# Patient Record
Sex: Female | Born: 1950
Health system: Southern US, Community
[De-identification: ages and names within clinical notes are randomized; demographics above are authoritative.]

## PROBLEM LIST (undated history)

## (undated) DIAGNOSIS — I872 Venous insufficiency (chronic) (peripheral): Secondary | ICD-10-CM

## (undated) DIAGNOSIS — R9431 Abnormal electrocardiogram [ECG] [EKG]: Secondary | ICD-10-CM

## (undated) DIAGNOSIS — K635 Polyp of colon: Secondary | ICD-10-CM

## (undated) DIAGNOSIS — E663 Overweight: Secondary | ICD-10-CM

## (undated) DIAGNOSIS — M069 Rheumatoid arthritis, unspecified: Secondary | ICD-10-CM

## (undated) DIAGNOSIS — D72820 Lymphocytosis (symptomatic): Secondary | ICD-10-CM

## (undated) DIAGNOSIS — J189 Pneumonia, unspecified organism: Secondary | ICD-10-CM

## (undated) DIAGNOSIS — K760 Fatty (change of) liver, not elsewhere classified: Secondary | ICD-10-CM

## (undated) DIAGNOSIS — K649 Unspecified hemorrhoids: Secondary | ICD-10-CM

## (undated) DIAGNOSIS — L237 Allergic contact dermatitis due to plants, except food: Secondary | ICD-10-CM

## (undated) DIAGNOSIS — K5730 Diverticulosis of large intestine without perforation or abscess without bleeding: Secondary | ICD-10-CM

## (undated) DIAGNOSIS — T7840XA Allergy, unspecified, initial encounter: Secondary | ICD-10-CM

## (undated) DIAGNOSIS — H269 Unspecified cataract: Secondary | ICD-10-CM

## (undated) DIAGNOSIS — T8859XA Other complications of anesthesia, initial encounter: Secondary | ICD-10-CM

## (undated) DIAGNOSIS — I499 Cardiac arrhythmia, unspecified: Secondary | ICD-10-CM

## (undated) DIAGNOSIS — K219 Gastro-esophageal reflux disease without esophagitis: Secondary | ICD-10-CM

## (undated) DIAGNOSIS — Z9889 Other specified postprocedural states: Secondary | ICD-10-CM

## (undated) DIAGNOSIS — T4145XA Adverse effect of unspecified anesthetic, initial encounter: Secondary | ICD-10-CM

## (undated) DIAGNOSIS — R9389 Abnormal findings on diagnostic imaging of other specified body structures: Secondary | ICD-10-CM

## (undated) DIAGNOSIS — E785 Hyperlipidemia, unspecified: Secondary | ICD-10-CM

## (undated) DIAGNOSIS — C801 Malignant (primary) neoplasm, unspecified: Secondary | ICD-10-CM

## (undated) DIAGNOSIS — I10 Essential (primary) hypertension: Secondary | ICD-10-CM

## (undated) DIAGNOSIS — K5720 Diverticulitis of large intestine with perforation and abscess without bleeding: Secondary | ICD-10-CM

## (undated) DIAGNOSIS — R112 Nausea with vomiting, unspecified: Secondary | ICD-10-CM

## (undated) HISTORY — DX: Abnormal electrocardiogram (ECG) (EKG): R94.31

## (undated) HISTORY — DX: Lymphocytosis (symptomatic): D72.820

## (undated) HISTORY — DX: Gastro-esophageal reflux disease without esophagitis: K21.9

## (undated) HISTORY — DX: Polyp of colon: K63.5

## (undated) HISTORY — DX: Venous insufficiency (chronic) (peripheral): I87.2

## (undated) HISTORY — PX: OTHER SURGICAL HISTORY: SHX169

## (undated) HISTORY — DX: Unspecified cataract: H26.9

## (undated) HISTORY — DX: Hyperlipidemia, unspecified: E78.5

## (undated) HISTORY — PX: JOINT REPLACEMENT: SHX530

## (undated) HISTORY — PX: FRACTURE SURGERY: SHX138

## (undated) HISTORY — DX: Diverticulitis of large intestine with perforation and abscess without bleeding: K57.20

## (undated) HISTORY — PX: NASAL SEPTUM SURGERY: SHX37

## (undated) HISTORY — PX: COLONOSCOPY: SHX174

## (undated) HISTORY — DX: Allergy, unspecified, initial encounter: T78.40XA

## (undated) HISTORY — PX: TONSILLECTOMY: SUR1361

## (undated) HISTORY — DX: Diverticulosis of large intestine without perforation or abscess without bleeding: K57.30

## (undated) HISTORY — DX: Fatty (change of) liver, not elsewhere classified: K76.0

## (undated) HISTORY — DX: Unspecified hemorrhoids: K64.9

## (undated) HISTORY — PX: COSMETIC SURGERY: SHX468

## (undated) HISTORY — PX: EYE SURGERY: SHX253

## (undated) HISTORY — DX: Allergic contact dermatitis due to plants, except food: L23.7

## (undated) HISTORY — DX: Abnormal findings on diagnostic imaging of other specified body structures: R93.89

## (undated) HISTORY — PX: COLON SURGERY: SHX602

## (undated) HISTORY — PX: FOOT SURGERY: SHX648

## (undated) HISTORY — DX: Overweight: E66.3

## (undated) HISTORY — PX: INNER EAR SURGERY: SHX679

## (undated) HISTORY — PX: POLYPECTOMY: SHX149

---

## 1985-11-02 HISTORY — PX: TOTAL ABDOMINAL HYSTERECTOMY: SHX209

## 2000-09-30 ENCOUNTER — Encounter: Payer: Self-pay | Admitting: Obstetrics and Gynecology

## 2000-09-30 ENCOUNTER — Encounter: Admission: RE | Admit: 2000-09-30 | Discharge: 2000-09-30 | Payer: Self-pay | Admitting: Obstetrics and Gynecology

## 2001-07-15 ENCOUNTER — Other Ambulatory Visit: Admission: RE | Admit: 2001-07-15 | Discharge: 2001-07-15 | Payer: Self-pay | Admitting: Internal Medicine

## 2001-07-15 ENCOUNTER — Encounter: Payer: Self-pay | Admitting: Internal Medicine

## 2001-07-15 ENCOUNTER — Encounter (INDEPENDENT_AMBULATORY_CARE_PROVIDER_SITE_OTHER): Payer: Self-pay | Admitting: Specialist

## 2001-10-03 ENCOUNTER — Encounter: Payer: Self-pay | Admitting: Obstetrics and Gynecology

## 2001-10-03 ENCOUNTER — Encounter: Admission: RE | Admit: 2001-10-03 | Discharge: 2001-10-03 | Payer: Self-pay | Admitting: Obstetrics and Gynecology

## 2001-11-16 ENCOUNTER — Encounter: Payer: Self-pay | Admitting: Pulmonary Disease

## 2002-12-08 ENCOUNTER — Encounter: Admission: RE | Admit: 2002-12-08 | Discharge: 2002-12-08 | Payer: Self-pay | Admitting: Obstetrics and Gynecology

## 2002-12-08 ENCOUNTER — Encounter: Payer: Self-pay | Admitting: Obstetrics and Gynecology

## 2004-03-22 ENCOUNTER — Encounter: Admission: RE | Admit: 2004-03-22 | Discharge: 2004-03-22 | Payer: Self-pay | Admitting: Obstetrics and Gynecology

## 2004-09-14 ENCOUNTER — Ambulatory Visit (HOSPITAL_COMMUNITY): Admission: RE | Admit: 2004-09-14 | Discharge: 2004-09-14 | Payer: Self-pay | Admitting: Orthopedic Surgery

## 2004-09-14 ENCOUNTER — Ambulatory Visit (HOSPITAL_BASED_OUTPATIENT_CLINIC_OR_DEPARTMENT_OTHER): Admission: RE | Admit: 2004-09-14 | Discharge: 2004-09-14 | Payer: Self-pay | Admitting: Orthopedic Surgery

## 2004-10-31 ENCOUNTER — Ambulatory Visit: Payer: Self-pay | Admitting: Internal Medicine

## 2004-11-16 ENCOUNTER — Ambulatory Visit: Payer: Self-pay | Admitting: Internal Medicine

## 2004-12-13 ENCOUNTER — Ambulatory Visit: Payer: Self-pay | Admitting: Family Medicine

## 2005-01-16 ENCOUNTER — Ambulatory Visit: Payer: Self-pay | Admitting: Pulmonary Disease

## 2005-02-06 ENCOUNTER — Ambulatory Visit: Payer: Self-pay | Admitting: Pulmonary Disease

## 2005-04-05 ENCOUNTER — Encounter: Admission: RE | Admit: 2005-04-05 | Discharge: 2005-04-05 | Payer: Self-pay | Admitting: Obstetrics and Gynecology

## 2005-05-17 ENCOUNTER — Ambulatory Visit: Payer: Self-pay | Admitting: Pulmonary Disease

## 2005-07-02 ENCOUNTER — Ambulatory Visit: Payer: Self-pay | Admitting: Pulmonary Disease

## 2005-08-01 ENCOUNTER — Ambulatory Visit: Payer: Self-pay | Admitting: Licensed Clinical Social Worker

## 2005-12-21 ENCOUNTER — Ambulatory Visit: Payer: Self-pay | Admitting: Pulmonary Disease

## 2006-05-10 ENCOUNTER — Encounter: Admission: RE | Admit: 2006-05-10 | Discharge: 2006-05-10 | Payer: Self-pay | Admitting: Obstetrics and Gynecology

## 2006-09-30 ENCOUNTER — Ambulatory Visit: Payer: Self-pay | Admitting: Pulmonary Disease

## 2006-10-07 ENCOUNTER — Ambulatory Visit: Payer: Self-pay | Admitting: Pulmonary Disease

## 2006-11-04 ENCOUNTER — Ambulatory Visit: Payer: Self-pay | Admitting: Pulmonary Disease

## 2007-01-28 ENCOUNTER — Ambulatory Visit: Payer: Self-pay | Admitting: Pulmonary Disease

## 2007-01-28 LAB — CONVERTED CEMR LAB
ALT: 18 units/L (ref 0–40)
AST: 20 units/L (ref 0–37)
Basophils Absolute: 0 10*3/uL (ref 0.0–0.1)
Basophils Relative: 0.5 % (ref 0.0–1.0)
Bilirubin, Direct: 0.1 mg/dL (ref 0.0–0.3)
Calcium: 10.3 mg/dL (ref 8.4–10.5)
Creatinine, Ser: 0.8 mg/dL (ref 0.4–1.2)
Direct LDL: 109.2 mg/dL
Eosinophils Absolute: 0.1 10*3/uL (ref 0.0–0.6)
Eosinophils Relative: 1.3 % (ref 0.0–5.0)
MCHC: 35.1 g/dL (ref 30.0–36.0)
Monocytes Absolute: 0.6 10*3/uL (ref 0.2–0.7)
Monocytes Relative: 7.4 % (ref 3.0–11.0)
Neutrophils Relative %: 67.6 % (ref 43.0–77.0)
Sodium: 141 meq/L (ref 135–145)
TSH: 1.83 microintl units/mL (ref 0.35–5.50)
Total Bilirubin: 0.7 mg/dL (ref 0.3–1.2)
Total CHOL/HDL Ratio: 3.4
VLDL: 68 mg/dL — ABNORMAL HIGH (ref 0–40)
WBC: 7.8 10*3/uL (ref 4.5–10.5)

## 2007-05-14 ENCOUNTER — Encounter: Admission: RE | Admit: 2007-05-14 | Discharge: 2007-05-14 | Payer: Self-pay | Admitting: Obstetrics and Gynecology

## 2007-05-28 ENCOUNTER — Ambulatory Visit: Payer: Self-pay | Admitting: Pulmonary Disease

## 2007-05-28 LAB — CONVERTED CEMR LAB
Albumin: 3.2 g/dL — ABNORMAL LOW (ref 3.5–5.2)
Alkaline Phosphatase: 67 units/L (ref 39–117)
Bilirubin, Direct: 0.1 mg/dL (ref 0.0–0.3)
Calcium: 9.6 mg/dL (ref 8.4–10.5)
Chloride: 99 meq/L (ref 96–112)
Creatinine, Ser: 0.9 mg/dL (ref 0.4–1.2)
GFR calc non Af Amer: 68 mL/min
Potassium: 4.2 meq/L (ref 3.5–5.1)
Sodium: 141 meq/L (ref 135–145)
Total Bilirubin: 0.6 mg/dL (ref 0.3–1.2)
Triglycerides: 408 mg/dL (ref 0–149)
VLDL: 82 mg/dL — ABNORMAL HIGH (ref 0–40)

## 2007-10-29 ENCOUNTER — Ambulatory Visit: Payer: Self-pay | Admitting: Pulmonary Disease

## 2008-03-11 ENCOUNTER — Telehealth (INDEPENDENT_AMBULATORY_CARE_PROVIDER_SITE_OTHER): Payer: Self-pay | Admitting: *Deleted

## 2008-03-11 ENCOUNTER — Ambulatory Visit: Payer: Self-pay | Admitting: Pulmonary Disease

## 2008-03-11 DIAGNOSIS — L255 Unspecified contact dermatitis due to plants, except food: Secondary | ICD-10-CM | POA: Insufficient documentation

## 2008-04-10 ENCOUNTER — Encounter: Payer: Self-pay | Admitting: Pulmonary Disease

## 2008-04-14 ENCOUNTER — Telehealth: Payer: Self-pay | Admitting: Pulmonary Disease

## 2008-04-20 ENCOUNTER — Ambulatory Visit: Payer: Self-pay | Admitting: Pulmonary Disease

## 2008-04-20 ENCOUNTER — Encounter: Payer: Self-pay | Admitting: Adult Health

## 2008-04-20 LAB — CONVERTED CEMR LAB
AST: 22 units/L (ref 0–37)
Alkaline Phosphatase: 45 units/L (ref 39–117)
Basophils Absolute: 0.1 10*3/uL (ref 0.0–0.1)
Basophils Relative: 0.9 % (ref 0.0–1.0)
Bilirubin Urine: NEGATIVE
Bilirubin, Direct: 0.2 mg/dL (ref 0.0–0.3)
Calcium: 10.3 mg/dL (ref 8.4–10.5)
HCT: 36.8 % (ref 36.0–46.0)
HDL: 62.7 mg/dL (ref >39.0)
Ketones, ur: NEGATIVE mg/dL
Leukocytes, UA: NEGATIVE
Lymphocytes Relative: 20.1 % (ref 12.0–46.0)
Monocytes Relative: 18.5 % — ABNORMAL HIGH (ref 3.0–12.0)
Neutrophils Relative %: 59.4 % (ref 43.0–77.0)
Nitrite: NEGATIVE
Platelets: 268 10*3/uL (ref 150–400)
Potassium: 4.5 meq/L (ref 3.5–5.1)
RBC: 4.13 M/uL (ref 3.87–5.11)
Sodium: 140 meq/L (ref 135–145)
TSH: 2.16 microintl units/mL (ref 0.35–5.50)
Total CHOL/HDL Ratio: 2.2
Total Protein, Urine: NEGATIVE mg/dL
Vit D, 1,25-Dihydroxy: 40 (ref 30–89)
WBC: 8.7 10*3/uL (ref 4.5–10.5)

## 2008-04-23 ENCOUNTER — Ambulatory Visit: Payer: Self-pay | Admitting: Internal Medicine

## 2008-04-23 DIAGNOSIS — J309 Allergic rhinitis, unspecified: Secondary | ICD-10-CM | POA: Insufficient documentation

## 2008-04-23 DIAGNOSIS — E782 Mixed hyperlipidemia: Secondary | ICD-10-CM | POA: Insufficient documentation

## 2008-04-23 DIAGNOSIS — I872 Venous insufficiency (chronic) (peripheral): Secondary | ICD-10-CM | POA: Insufficient documentation

## 2008-04-23 DIAGNOSIS — K219 Gastro-esophageal reflux disease without esophagitis: Secondary | ICD-10-CM | POA: Insufficient documentation

## 2008-05-14 ENCOUNTER — Encounter: Admission: RE | Admit: 2008-05-14 | Discharge: 2008-05-14 | Payer: Self-pay | Admitting: Obstetrics and Gynecology

## 2008-07-02 ENCOUNTER — Telehealth: Payer: Self-pay | Admitting: Pulmonary Disease

## 2008-07-05 ENCOUNTER — Telehealth: Payer: Self-pay | Admitting: Internal Medicine

## 2008-07-27 ENCOUNTER — Ambulatory Visit: Payer: Self-pay | Admitting: Internal Medicine

## 2008-07-27 LAB — CONVERTED CEMR LAB
Eosinophils Absolute: 0.1 10*3/uL (ref 0.0–0.7)
Eosinophils Relative: 1.7 % (ref 0.0–5.0)
Hemoglobin: 12.4 g/dL (ref 12.0–15.0)
MCHC: 35 g/dL (ref 30.0–36.0)
Monocytes Absolute: 1.1 10*3/uL — ABNORMAL HIGH (ref 0.1–1.0)
Monocytes Relative: 19.8 % — ABNORMAL HIGH (ref 3.0–12.0)
Neutro Abs: 2.6 10*3/uL (ref 1.4–7.7)
Neutrophils Relative %: 47.2 % (ref 43.0–77.0)
RBC: 3.97 M/uL (ref 3.87–5.11)
RDW: 13.5 % (ref 11.5–14.6)
WBC: 5.5 10*3/uL (ref 4.5–10.5)

## 2008-08-16 ENCOUNTER — Ambulatory Visit: Payer: Self-pay | Admitting: Internal Medicine

## 2008-10-11 ENCOUNTER — Telehealth: Payer: Self-pay | Admitting: Pulmonary Disease

## 2008-10-13 ENCOUNTER — Ambulatory Visit: Payer: Self-pay | Admitting: Pulmonary Disease

## 2008-10-20 ENCOUNTER — Ambulatory Visit: Payer: Self-pay | Admitting: Pulmonary Disease

## 2008-10-20 DIAGNOSIS — I10 Essential (primary) hypertension: Secondary | ICD-10-CM | POA: Insufficient documentation

## 2008-10-20 DIAGNOSIS — M542 Cervicalgia: Secondary | ICD-10-CM | POA: Insufficient documentation

## 2008-10-20 DIAGNOSIS — K5730 Diverticulosis of large intestine without perforation or abscess without bleeding: Secondary | ICD-10-CM | POA: Insufficient documentation

## 2008-10-20 DIAGNOSIS — N3090 Cystitis, unspecified without hematuria: Secondary | ICD-10-CM | POA: Insufficient documentation

## 2008-10-20 DIAGNOSIS — K635 Polyp of colon: Secondary | ICD-10-CM | POA: Insufficient documentation

## 2008-10-20 LAB — CONVERTED CEMR LAB
ALT: 31 units/L (ref 0–35)
Alkaline Phosphatase: 43 units/L (ref 39–117)
Basophils Absolute: 0 10*3/uL (ref 0.0–0.1)
Basophils Relative: 0.5 % (ref 0.0–3.0)
Calcium: 9.9 mg/dL (ref 8.4–10.5)
Chloride: 102 meq/L (ref 96–112)
Cholesterol: 146 mg/dL (ref 0–200)
Eosinophils Absolute: 0.1 10*3/uL (ref 0.0–0.7)
Eosinophils Relative: 2 % (ref 0.0–5.0)
GFR calc Af Amer: 65 mL/min
Hemoglobin: 13.1 g/dL (ref 12.0–15.0)
Lymphocytes Relative: 34 % (ref 12.0–46.0)
Lymphocytes Relative: 24.8 % (ref 12.0–46.0)
MCHC: 33.9 g/dL (ref 30.0–36.0)
Monocytes Absolute: 1.6 10*3/uL — ABNORMAL HIGH (ref 0.1–1.0)
Monocytes Relative: 24.4 % — ABNORMAL HIGH (ref 3.0–12.0)
Neutrophils Relative %: 39.1 % — ABNORMAL LOW (ref 43.0–77.0)
Platelets: 244 10*3/uL (ref 150–400)
Potassium: 4.7 meq/L (ref 3.5–5.1)
RBC: 4.23 M/uL (ref 3.87–5.11)
RDW: 12.8 % (ref 11.5–14.6)
Sodium: 138 meq/L (ref 135–145)
TSH: 1.48 microintl units/mL (ref 0.35–5.50)
Total Bilirubin: 0.6 mg/dL (ref 0.3–1.2)
Total Protein: 6.8 g/dL (ref 6.0–8.3)

## 2008-10-31 DIAGNOSIS — E663 Overweight: Secondary | ICD-10-CM | POA: Insufficient documentation

## 2008-10-31 DIAGNOSIS — D72821 Monocytosis (symptomatic): Secondary | ICD-10-CM | POA: Insufficient documentation

## 2008-10-31 LAB — CONVERTED CEMR LAB
Basophils Absolute: 0 10*3/uL (ref 0.0–0.1)
Basophils Relative: 0 % (ref 0–1)
HCT: 39.9 % (ref 36.0–46.0)
Lymphocytes Relative: 26 % (ref 12–46)
MCHC: 33.3 g/dL (ref 30.0–36.0)
MCV: 89.9 fL (ref 78.0–100.0)
Monocytes Absolute: 1.6 10*3/uL — ABNORMAL HIGH (ref 0.1–1.0)
Neutro Abs: 3.9 10*3/uL (ref 1.7–7.7)
Neutrophils Relative %: 52 % (ref 43–77)
Platelets: 279 10*3/uL (ref 150–400)
RBC: 4.44 M/uL (ref 3.87–5.11)
RDW: 14.1 % (ref 11.5–15.5)

## 2008-11-05 ENCOUNTER — Telehealth: Payer: Self-pay | Admitting: Pulmonary Disease

## 2008-12-06 ENCOUNTER — Telehealth: Payer: Self-pay | Admitting: Pulmonary Disease

## 2009-06-16 ENCOUNTER — Encounter: Admission: RE | Admit: 2009-06-16 | Discharge: 2009-06-16 | Payer: Self-pay | Admitting: Obstetrics and Gynecology

## 2009-08-19 ENCOUNTER — Ambulatory Visit: Payer: Self-pay | Admitting: Pulmonary Disease

## 2009-08-22 LAB — CONVERTED CEMR LAB
Basophils Absolute: 0.2 10*3/uL — ABNORMAL HIGH (ref 0.0–0.1)
Eosinophils Absolute: 0.1 10*3/uL (ref 0.0–0.7)
HCT: 37.1 % (ref 36.0–46.0)
Iron: 100 ug/dL (ref 42–145)
Lymphocytes Relative: 29.2 % (ref 12.0–46.0)
MCHC: 35.1 g/dL (ref 30.0–36.0)
MCV: 92.6 fL (ref 78.0–100.0)
Monocytes Relative: 20.9 % — ABNORMAL HIGH (ref 3.0–12.0)
Neutro Abs: 3.1 10*3/uL (ref 1.4–7.7)
Platelets: 215 10*3/uL (ref 150.0–400.0)
RBC: 4.01 M/uL (ref 3.87–5.11)
RDW: 12.9 % (ref 11.5–14.6)
Saturation Ratios: 21.2 % (ref 20.0–50.0)

## 2009-09-12 ENCOUNTER — Encounter: Payer: Self-pay | Admitting: Pulmonary Disease

## 2009-11-07 ENCOUNTER — Encounter: Payer: Self-pay | Admitting: Pulmonary Disease

## 2009-11-15 ENCOUNTER — Telehealth: Payer: Self-pay | Admitting: Pulmonary Disease

## 2009-11-18 ENCOUNTER — Ambulatory Visit: Payer: Self-pay | Admitting: Pulmonary Disease

## 2009-11-22 ENCOUNTER — Ambulatory Visit: Payer: Self-pay | Admitting: Pulmonary Disease

## 2009-11-22 DIAGNOSIS — R9431 Abnormal electrocardiogram [ECG] [EKG]: Secondary | ICD-10-CM | POA: Insufficient documentation

## 2009-11-22 LAB — CONVERTED CEMR LAB
ALT: 38 units/L — ABNORMAL HIGH (ref 0–35)
AST: 36 units/L (ref 0–37)
Albumin: 3.9 g/dL (ref 3.5–5.2)
Basophils Relative: 0 % (ref 0–1)
Bilirubin Urine: NEGATIVE
Eosinophils Relative: 1 % (ref 0–5)
GFR calc non Af Amer: 53.74 mL/min (ref >60)
HCT: 36.4 % (ref 36.0–46.0)
HDL: 53.7 mg/dL (ref >39.00)
Hemoglobin: 12 g/dL (ref 12.0–15.0)
LDL Cholesterol: 91 mg/dL (ref 0–99)
Leukocytes, UA: NEGATIVE
Lymphocytes Relative: 39 % (ref 12–46)
Lymphs Abs: 2.2 10*3/uL (ref 0.7–4.0)
MCHC: 33 g/dL (ref 30.0–36.0)
MCV: 93.3 fL (ref 78.0–100.0)
Monocytes Absolute: 1.4 10*3/uL — ABNORMAL HIGH (ref 0.1–1.0)
Monocytes Relative: 25 % — ABNORMAL HIGH (ref 3–12)
Nitrite: NEGATIVE
Platelets: 267 10*3/uL (ref 150–400)
Potassium: 4.4 meq/L (ref 3.5–5.1)
Total Bilirubin: 0.6 mg/dL (ref 0.3–1.2)
Total Protein: 6.7 g/dL (ref 6.0–8.3)
Triglycerides: 85 mg/dL (ref 0.0–149.0)
VLDL: 17 mg/dL (ref 0.0–40.0)

## 2009-12-05 ENCOUNTER — Ambulatory Visit: Payer: Self-pay | Admitting: Cardiology

## 2009-12-05 ENCOUNTER — Telehealth: Payer: Self-pay | Admitting: Pulmonary Disease

## 2009-12-16 ENCOUNTER — Encounter: Payer: Self-pay | Admitting: Pulmonary Disease

## 2009-12-26 ENCOUNTER — Ambulatory Visit: Payer: Self-pay | Admitting: Internal Medicine

## 2009-12-26 DIAGNOSIS — R1319 Other dysphagia: Secondary | ICD-10-CM | POA: Insufficient documentation

## 2010-03-22 ENCOUNTER — Ambulatory Visit: Payer: Self-pay | Admitting: Internal Medicine

## 2010-03-23 ENCOUNTER — Encounter: Payer: Self-pay | Admitting: Internal Medicine

## 2010-06-28 ENCOUNTER — Encounter: Admission: RE | Admit: 2010-06-28 | Discharge: 2010-06-28 | Payer: Self-pay | Admitting: Obstetrics and Gynecology

## 2010-10-10 ENCOUNTER — Telehealth: Payer: Self-pay | Admitting: Pulmonary Disease

## 2010-10-23 ENCOUNTER — Encounter: Payer: Self-pay | Admitting: Pulmonary Disease

## 2010-11-14 ENCOUNTER — Telehealth (INDEPENDENT_AMBULATORY_CARE_PROVIDER_SITE_OTHER): Payer: Self-pay | Admitting: *Deleted

## 2010-11-16 ENCOUNTER — Ambulatory Visit: Payer: Self-pay | Admitting: Pulmonary Disease

## 2010-11-17 ENCOUNTER — Encounter: Payer: Self-pay | Admitting: Pulmonary Disease

## 2010-11-24 ENCOUNTER — Ambulatory Visit: Payer: Self-pay | Admitting: Pulmonary Disease

## 2010-11-24 DIAGNOSIS — K760 Fatty (change of) liver, not elsewhere classified: Secondary | ICD-10-CM | POA: Insufficient documentation

## 2010-11-24 LAB — CONVERTED CEMR LAB
ALT: 44 units/L — ABNORMAL HIGH (ref 0–35)
Alkaline Phosphatase: 44 units/L (ref 39–117)
Basophils Absolute: 0 10*3/uL (ref 0.0–0.1)
Bilirubin Urine: NEGATIVE
Bilirubin, Direct: 0.1 mg/dL (ref 0.0–0.3)
Calcium: 9.8 mg/dL (ref 8.4–10.5)
Creatinine, Ser: 1 mg/dL (ref 0.4–1.2)
GFR calc non Af Amer: 61.93 mL/min (ref >60.00)
HCT: 35.8 % — ABNORMAL LOW (ref 36.0–46.0)
LDL Cholesterol: 79 mg/dL (ref 0–99)
Lymphs Abs: 1.8 10*3/uL (ref 0.7–4.0)
MCHC: 34.3 g/dL (ref 30.0–36.0)
Monocytes Absolute: 1.8 10*3/uL — ABNORMAL HIGH (ref 0.1–1.0)
Monocytes Relative: 28.9 % — ABNORMAL HIGH (ref 3.0–12.0)
Neutro Abs: 2.6 10*3/uL (ref 1.4–7.7)
Platelets: 180 10*3/uL (ref 150.0–400.0)
Potassium: 4.8 meq/L (ref 3.5–5.1)
RBC: 3.84 M/uL — ABNORMAL LOW (ref 3.87–5.11)
Sodium: 139 meq/L (ref 135–145)
Specific Gravity, Urine: <=1.005 (ref 1.000–1.030)
TSH: 1.78 microintl units/mL (ref 0.35–5.50)
Total CHOL/HDL Ratio: 3
VLDL: 12 mg/dL (ref 0.0–40.0)

## 2011-01-04 NOTE — Letter (Signed)
Summary: Patient Notice- Polyp Results  [facility]  [address] [place], [place] [address]   Phone: [phone]  Fax: [phone]        March 23, 2010 MRN: [id number]    ELIZIBETH BREAU [address] [place], [place]  [address]    Dear Ms. Yetta Barre,  I am pleased to inform you that the colon polyp(s) removed during your recent colonoscopy was (were) found to be benign (no cancer detected) upon pathologic examination.  I recommend you have a repeat colonoscopy examination in 5 years to look for recurrent polyps, as having colon polyps increases your risk for having recurrent polyps or even colon cancer in the future.  Should you develop new or worsening symptoms of abdominal pain, bowel habit changes or bleeding from the rectum or bowels, please schedule an evaluation with either your primary care physician or with me.  Additional information/recommendations:  __ No further action with gastroenterology is needed at this time. Please      follow-up with your primary care physician for your other healthcare      needs.  Please call us if you are having persistent problems or have questions about your condition that have not been fully answered at this time.  Sincerely,  Hilarie Fredrickson MD  This letter has been electronically signed by your physician.  Appended Document: Patient Notice- Polyp Results letter mailed 4.26.11
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
## 2011-01-04 NOTE — Letter (Signed)
Summary: [facility] Instructions  [facility]  [address] [place], [place] [address]   Phone: [phone]  Fax: [phone]       Morgan [name]    Aug 03, 1951    MRN: [id number]        Procedure Day /Date:MONDAY, 01/02/10     Arrival Time:12:30 PM     Procedure Time:1:30 PM     Location of Procedure:                    [facility] (4th Floor)                        PREPARATION FOR COLONOSCOPY WITH MOVIPREP   Starting 5 days prior to your procedure 1/27/11do not eat nuts, seeds, popcorn, corn, beans, peas,  salads, or any raw vegetables.  Do not take any fiber supplements (e.g. Metamucil, Citrucel, and Benefiber).  THE DAY BEFORE YOUR PROCEDURE         DATE:01/01/10  DAY: SUNDAY  1.  Drink clear liquids the entire day-NO SOLID FOOD  2.  Do not drink anything colored red or purple.  Avoid juices with pulp.  No orange juice.  3.  Drink at least 64 oz. (8 glasses) of fluid/clear liquids during the day to prevent dehydration and help the prep work efficiently.  CLEAR LIQUIDS INCLUDE: Water Jello Ice Popsicles Tea (sugar ok, no milk/cream) Powdered fruit flavored drinks Coffee (sugar ok, no milk/cream) Gatorade Juice: apple, white grape, white cranberry  Lemonade Clear bullion, consomm, broth Carbonated beverages (any kind) Strained chicken noodle soup Hard Candy                             4.  In the morning, mix first dose of MoviPrep solution:    Empty 1 Pouch A and 1 Pouch B into the disposable container    Add lukewarm drinking water to the top line of the container. Mix to dissolve    Refrigerate (mixed solution should be used within 24 hrs)  5.  Begin drinking the prep at 5:00 p.m. The MoviPrep container is divided by 4 marks.   Every 15 minutes drink the solution down to the next mark (approximately 8 oz) until the full liter is complete.   6.  Follow completed prep with 16 oz of clear liquid of your choice (Nothing red or  purple).  Continue to drink clear liquids until bedtime.  7.  Before going to bed, mix second dose of MoviPrep solution:    Empty 1 Pouch A and 1 Pouch B into the disposable container    Add lukewarm drinking water to the top line of the container. Mix to dissolve    Refrigerate  THE DAY OF YOUR PROCEDURE      DATE: 01/02/10 DAY: MONDAY  Beginning at 8:30 a.m. (5 hours before procedure):         1. Every 15 minutes, drink the solution down to the next mark (approx 8 oz) until the full liter is complete.  2. Follow completed prep with 16 oz. of clear liquid of your choice.    3. You may drink clear liquids until 11:30 AM(2 HOURS BEFORE PROCEDURE).   MEDICATION INSTRUCTIONS  Unless otherwise instructed, you should take regular prescription medications with a small sip of water   as early as possible the morning of your procedure.  OTHER INSTRUCTIONS  You will need a responsible adult at least 62 years of age to accompany you and drive you home.   This person must remain in the waiting room during your procedure.  Wear loose fitting clothing that is easily removed.  Leave jewelry and other valuables at home.  However, you may wish to bring a book to read or  an iPod/MP3 player to listen to music as you wait for your procedure to start.  Remove all body piercing jewelry and leave at home.  Total time from sign-in until discharge is approximately 2-3 hours.  You should go home directly after your procedure and rest.  You can resume normal activities the  day after your procedure.  The day of your procedure you should not:   Drive   Make legal decisions   Operate machinery   Drink alcohol   Return to work  You will receive specific instructions about eating, activities and medications before you leave.    The above instructions have been reviewed and explained to me by   _______________________    I fully understand and can verbalize these  instructions _____________________________ Date _________

## 2011-01-04 NOTE — Procedures (Signed)
Summary: Colonoscopy/[facility]  Colonoscopy/[facility]   Imported By: Sherian Rein 12/27/2009 07:19:31  _____________________________________________________________________  External Attachment:    Type:   Image     Comment:   External Document

## 2011-01-04 NOTE — Letter (Signed)
Summary: Consuela Mimes MD  Consuela Mimes MD   Imported By: Lester Vernon 11/23/2010 10:59:17  _____________________________________________________________________  External Attachment:    Type:   Image     Comment:   External Document

## 2011-01-04 NOTE — Progress Notes (Signed)
Summary: ct scan results request  Phone Note Call from Patient Call back at [phone]   Caller: Spouse Call For: Morgan [name] Summary of Call: wants to speak with nurse, re: ct scan that was done today. Initial call taken by: Darletta Moll,  December 05, 2009 9:53 AM  Follow-up for Phone Call        FYI-Spouse advised if he can be contacted first if there is bad news to relay to pt. Morgan [name] CMA  December 05, 2009 11:40 AM    SN spoke with pt about her results. Randell Loop CMA  December 08, 2009 3:46 PM

## 2011-01-04 NOTE — Miscellaneous (Signed)
Summary: [facility]  [facility]   Imported By: Lester [place] 12/23/2009 09:41:49  _____________________________________________________________________  External Attachment:    Type:   Image     Comment:   External Document

## 2011-01-04 NOTE — Progress Notes (Signed)
Summary: lab appt  Phone Note Call from Patient Call back at Home Phone [phone]   Caller: Patient Call For: nadel Summary of Call: Pt states she has appt on 12/23 and wants to schedule labs prior to this ov. Initial call taken by: Darletta Moll,  November 14, 2010 9:39 AM  Follow-up for Phone Call        Called, spoke with pt.  She has appt scheduled with SN for 11/24/10.  Would like to come in sometime this wk for labs.  Pls advise of labs pt will need.  Thanks! Gweneth Dimitri RN  November 14, 2010 10:30 AM   Additional Follow-up for Phone Call Additional follow up Details #1::        per SN---ok for pt to come in for labs----lip,bmp.hepat,cbcd,tsh,vit d and ua---v70.0.  thanks Randell Loop [facility]  November 14, 2010 12:17 PM     Additional Follow-up for Phone Call Additional follow up Details #2::    Pt advised that lab orders are in computer. Abigail Miyamoto RN  November 14, 2010 12:22 PM

## 2011-01-04 NOTE — Progress Notes (Signed)
Summary: refill   Phone Note Refill Request Message from:  Pharmacy on [facility] [phone]  fax#[phone]  Refills Requested: Medication #1:  SIMVASTATIN 40 MG TABS take 1 tab by mouth at bedtime...  Medication #2:  MICARDIS HCT 40-12.5 MG TABS take 1 tabpo once daily...  Medication #3:  FENOFIBRATE 160 MG TABS take 1 tab by mouth once daily... Initial call taken by: Alida Rogers,CMA November 8,2011 Caller: [facility]    Prescriptions: FENOFIBRATE 160 MG TABS (FENOFIBRATE) take 1 tab by mouth once daily...  #90 x 3   Entered by:   Randell Loop CMA   Authorized by:   Michele Mcalpine MD   Signed by:   Randell Loop CMA on 10/10/2010   Method used:   Faxed to ...       [facility]       [address]       [place], [place]  [address]       Ph: [phone]       Fax: [phone]   RxID:   [id number] SIMVASTATIN 40 MG TABS (SIMVASTATIN) take 1 tab by mouth at bedtime...  #90 x 3   Entered by:   Randell Loop CMA   Authorized by:   Michele Mcalpine MD   Signed by:   Randell Loop CMA on 10/10/2010   Method used:   Faxed to ...       [facility]       [address]       [place], [place]  [address]       Ph: [phone]       Fax: [phone]   RxID:   [id number] MICARDIS HCT 40-12.5 MG TABS (TELMISARTAN-HCTZ) take 1 tabpo once daily...  #90 x 3   Entered by:   Randell Loop CMA   Authorized by:   Michele Mcalpine MD   Signed by:   Randell Loop CMA on 10/10/2010   Method used:   Faxed to ...       [facility]       [address]       [place], [place]  [address]       Ph: [phone]       Fax: [phone]   RxID:   [id number]  pt has appt in dec with Dr. Elisha Ponder CMA  October 10, 2010 4:36 PM

## 2011-01-04 NOTE — Procedures (Signed)
Summary: Colonoscopy  Patient: Morgan [name] Note: All result statuses are Final unless otherwise noted.  Tests: (1) Colonoscopy (COL)   COL Colonoscopy           DONE     [facility]     [address]     [place], [place]  [address]           COLONOSCOPY PROCEDURE REPORT           PATIENT:  [name], [name]  MR#:  [id number]     BIRTHDATE:  [birth year], 58 yrs. old  GENDER:  female     ENDOSCOPIST:  Wilhemina Bonito. Eda Keys, MD     REF. BY:  Office     PROCEDURE DATE:  03/22/2010     PROCEDURE:  Colonoscopy with snare polypectomy x 1     ASA CLASS:  Class II     INDICATIONS:  history of pre-cancerous (adenomatous) colon polyps     (index 2002, f/u 2005),     surveillance and high-risk screening     MEDICATIONS:   Fentanyl 100 mcg IV, Versed 12 mg IV, Benadryl 50     mg IV           DESCRIPTION OF PROCEDURE:   After the risks benefits and     alternatives of the procedure were thoroughly explained, informed     consent was obtained.  Digital rectal exam was performed and     revealed no abnormalities.   The LB [id number] endoscope     was introduced through the anus and advanced to the cecum, which     was identified by both the appendix and ileocecal valve, without     limitations.Time to cecum = 3:32 min.  The quality of the prep was     excellent, using MoviPrep.  The instrument was then slowly     withdrawn (t= 13:47 min) as the colon was fully examined.     <<PROCEDUREIMAGES>>           FINDINGS:  A diminutive polyp was found in the descending colon.     Polyp was snared without cautery. Retrieval was successful. snare     polyp  Mild diverticulosis was found in the sigmoid colon.     Retroflexed views in the rectum revealed no abnormalities.    The     scope was then withdrawn from the patient and the procedure     completed.           COMPLICATIONS:  None     ENDOSCOPIC IMPRESSION:     1) Diminutive polyp in the descending colon - removed     2) Mild  diverticulosis in the sigmoid colon     RECOMMENDATIONS:     1) Follow up colonoscopy in 5 years           ______________________________     Wilhemina Bonito. Eda Keys, MD           CC:  Michele Mcalpine, MD; The Patient           n.     eSIGNED:   Wilhemina Bonito. Eda Keys at 03/22/2010 10:44 AM           Larrie Kass, [phone]  Note: An exclamation mark (!) indicates a result that was not dispersed into the flowsheet. Document Creation Date: 03/22/2010 10:45 AM _______________________________________________________________________  (1) Order result status: Final Collection or observation date-time: 03/22/2010 10:33 Requested date-time:  Receipt date-time:  Reported date-time:  Referring Physician:   Ordering Physician: Fransico Setters [phone]) Specimen Source:  Source: Launa Grill Order Number: [id number] Lab site:   Appended Document: Colonoscopy recall in 5 yrs/03-2015     Procedures Next Due Date:    Colonoscopy: 04/2015

## 2011-01-04 NOTE — Assessment & Plan Note (Signed)
Summary: rov//mbw   Primary Care Montell Leopard:  Alroy Dust, MD  CC:  Yearly ROV & review of mult medical problems....  History of Present Illness: 60 y/o WF here for a follow up visit & CPX... she has multiple medical problems as noted below...     ~  Nov09:  she had a bout of diverticulitis in Aug09 treated by DrPerry and finally resolved w/ Augmentin Rx... other than that- she has had a good year & her weight is down 12# on her diet to 199#.Marland Kitchen.   ~  November 22, 2009:  she reports another good year w/o new problems or concerns... her BP is controlled on the MicardisHct & she denies CP, palpit, etc...  she has had laser vein surg by DrKrusch on her right leg & will be getting the same on her left leg soon...  she does note occas dysphagia/ choking & we discussed GI eval from DrPerry (she has 12yr colon due too)...   ~  November 24, 2010:  she had GI eval from DrPerry 4/11 w/ EGD (normal), and Colon (mild sigm divertics & diminutive polyp= polypoid mucosa)... she continues to f/u w/ Vein specialists & DrFeatherston has done more surg & she is delighted w/ the results... her weight is down 7# to 182# today & she has been walking 2mi 5d/wk... only new complaint> right carpel tunnel symptoms & she states it's not bad enough for NCV testing, therefore rec wrist splint trial... recent labs reviewed & Lipids look great on Rx;  sl incr LFTs from fatty liver dis & she is reminded to avoid Etoh...    Current Problem List:  HEARING LOSS (ICD-389.9) - hx mild hi freq hearing loss- eval by DrPahel in 2004...  ALLERGIC RHINITIS (ICD-477.9) - on FEXOFENADINE 180mg /d... mild allergy symptoms...  ? of CHEST XRAY, ABNORMAL (ICD-793.1) -   ~  CXR 11/09 showed clear lungs, mild biapical pleural thickening, calcif Ao  ~  routine CXR 12/10 showed ?sm nodular opac RUL, & CTChest did not reveal a nodule- just biapical pleuroparenchymal scarring, & fatty liver...  ~  CXR 12/11 showed   HYPERTENSION (ICD-401.9) - on  MICARDIS/ HCT 40-12.5 daily... BP= 122/68 today and doing well- taking med regularly & tolerated well... denies HA, fatigue, visual changes, CP, palipit, dizziness, syncope, dyspnea, edema, etc...   ~  2DEcho 10/01 was WNL.Marland Kitchen. norm wall thickness, norm valves, norm EF...  ~  NuclearStressTest 10/01 showed breast attenuation, normal wall motion, EF= 65%...  ABNORMAL ELECTROCARDIOGRAM (ICD-794.31) - hx intermittent RBBB/ IVCD in the past and few PVC's noted (Holter 1997)... she is asymptomatic w/o CP, palpit. dizzy. syncope, etc...  ~  EKG 12/10 w/ RBBB, otherw neg...  VENOUS INSUFFICIENCY (ICD-459.81) - on low sodium diet, elevation, compression hose, etc... she has been followed since 2007 by drKrusch et al at the Vein clinic w/ surgery including endovenous chemical ablation & sclerotherapy in 2008, 2009, 2010, & 2011> she is very happy w/ her results & note from [facility] 11/11 is reviewed.  HYPERLIPIDEMIA (ICD-272.4) - on SIMVASTATIN 40mg /d & FENOFIBRATE 160mg /d...  ~  FLP 6/08 showed TChol 213, TG 408, HDL 56, LDL 101... on Vytorin10/20, added Tricor145.  ~  FLP 11/09 on Vytor10/20+Tric145 showed TChol 146, TG 75, HDL 52, LDL 79... changed to Simva40 + [id number] for $$ reasons...  ~  FLP 12/10 on Simva40+Feno160 showed TChol 162, TG 85, HDL 54, LDL 91  ~  FLP 12/11 on Simva40+Feno160 showed TChol 149, TG 60, HDL 59, LDL 79  OVERWEIGHT (ICD-278.02) - we discussed diet + exercise therapy...  ~  peak weight = 220# in 2007  ~  weight 11/09 = 199#  ~  weight 12/10 = 189#  ~  weight 12/11 = 182#  GERD (ICD-530.81) - she uses OTC PRILOSEC 20mg  as needed...   ~  EGD in 1976 by DrSam showed mild gastritis...  ~  12/10: she notes intermittent choking, dysphagia> EGD 4/11 by DrPerry was normal/ neg.  DIVERTICULOSIS OF COLON (ICD-562.10), & COLONIC POLYPS (ICD-211.3)  ~  colonoscopy 12/05 by DrPerry showed divertics and 2-45mm polyps= adenomatous and hyperplastic...  ~  colonoscopy 4/11 by  DrPerry showed mild sigm divertics, diminutive polyp= polypoid mucosa only...  FATTY LIVER DISEASE (ICD-571.8) - she has mild elev LFTs & instructed on low fat diet, wt reduction strategy.  ~  LFTs 11/09 (wt=191#) showed AlkPhos=43, SGOT=28, SGPT=31  ~  LFTs 12/10 (wt=189#) showed AlkPhos=47, SGOT=36, SGPT=38  ~  CT Chest 1/11 showed fatty liver as an incidental finding.  ~  LFTs 12/11 (wt=182#) showed AlkPhos=44, SGOT=52, SGPT=44  Hx of CYSTITIS (ICD-595.9) - Urology eval 11/08 by [id number] for chr cystitis...  Hx of NECK PAIN (ICD-723.1) - eval 11/09 w/ XRays showing some spondylosis & Rx w/ Tramadol, Rest, Heat, Tylenol...  CONTACT DERMATITIS DUE TO POISON IVY (ICD-692.6)  LYMPHOCYTOSIS (ICD-288.8) - CBC's show a mononuclear lymphocytosis of ? signif...  ~  CBC 2001 w/ WBC= 7.4 & 15% lymphs, 8% monos...  ~  CBC 10/07 showed WBC= 7.6 w/ 23% lymphs, 7% monos...  ~  CBC 2/08 showed WBC= 7.8 w/ 23% lymphs, 7% monos...  ~  CBC 5/09 showed WBC= 8.7 w/ 20% lymphs, 19% monos...  ~  CBC 11/09 showed WBC= 5.5 w/ 34% lymphs, 24% monos... repeat= 21% monos and pathologist review (DrHessling)= "normal RBC, WBC, plat morphology... normal peripheral blood smear"...  ~  CBC 12/10 showed WBC= 5.5, Lymphs= 35%, Monos= 25%  ~  CBC 12/11 showed WBC= 6.3, Lymph=29%, Mono=29%  Health Maintenance:   she is under some stress related to her husb smoking.  ~  GI:  DrPerry & up to date on colon screening...  ~  GYN= DrMcPhail on Estrdiol 0.5, +calcium, MVI... she gets PAP, Mammograms, etc...  ~  Immunizations:  she gets the yearly seasonal Flu vaccine... Pneumovax due age 72... ?last Tetanus shot.   Preventive Screening-Counseling & Management  Alcohol-Tobacco     Smoking Status: quit     Year Quit: 2003     Pack years: 15  Allergies (verified): No Known Drug Allergies  Comments:  Nurse/Medical Assistant: The patient's medications were reviewed with the patient's caretaker and were updated in the  Medication and Allergy Lists.  Past History:  Past Medical History: HEARING LOSS (ICD-389.9) ALLERGIC RHINITIS (ICD-477.9) ? of CHEST XRAY, ABNORMAL (ICD-793.1) HYPERTENSION (ICD-401.9) ABNORMAL ELECTROCARDIOGRAM (ICD-794.31) VENOUS INSUFFICIENCY (ICD-459.81) HYPERLIPIDEMIA (ICD-272.4) OVERWEIGHT (ICD-278.02) GERD (ICD-530.81) DIVERTICULOSIS OF COLON (ICD-562.10) COLONIC POLYPS (ICD-211.3) FATTY LIVER DISEASE (ICD-571.8) Hx of CYSTITIS (ICD-595.9) NECK PAIN (ICD-723.1) CONTACT DERMATITIS DUE TO POISON IVY (ICD-692.6) LYMPHOCYTOSIS (ICD-288.8)  Past Surgical History: S/P Hysterectomy 12/86 S/P Ear surgery (childhood) Hx Deviated Septum S/P foot surgery S/P chemical & laser endovenous ablation of LE veins    in 2008, 2009, 2010, 2011 by DrKrusch et al.  Family History: Reviewed history from 07/27/2008 and no changes required. Family history of heart disease in sister cancer in half-sister Mother is living, has hyperlipidema and hypertension Father died in 89, but pt was not close to him Sister  1 living, has heart disease, hx of liver transplant 49yrs ago Sister 2 living with no significant medical history Patient has a half-sister who passed at age 78 due to breast cancer No FH of Colon Cancer:  Social History: Reviewed history from 04/23/2008 and no changes required. Patient works as a Geographical information systems officer Quit smoking in Jan 2003 Occ drinks alcohol Patient is married with 4 children  Review of Systems       The patient complains of nocturia, arthritis, and anxiety.  The patient denies fever, chills, sweats, anorexia, fatigue, weakness, malaise, weight loss, sleep disorder, blurring, diplopia, eye irritation, eye discharge, vision loss, eye pain, photophobia, earache, ear discharge, tinnitus, decreased hearing, nasal congestion, nosebleeds, sore throat, hoarseness, chest pain, palpitations, syncope, dyspnea on exertion, orthopnea, PND, peripheral edema,  cough, dyspnea at rest, excessive sputum, hemoptysis, wheezing, pleurisy, nausea, vomiting, diarrhea, constipation, change in bowel habits, abdominal pain, melena, hematochezia, jaundice, gas/bloating, indigestion/heartburn, dysphagia, odynophagia, dysuria, hematuria, urinary frequency, urinary hesitancy, incontinence, back pain, joint pain, joint swelling, muscle cramps, muscle weakness, stiffness, sciatica, restless legs, leg pain at night, leg pain with exertion, rash, itching, dryness, suspicious lesions, paralysis, paresthesias, seizures, tremors, vertigo, transient blindness, frequent falls, frequent headaches, difficulty walking, depression, memory loss, confusion, cold intolerance, heat intolerance, polydipsia, polyphagia, polyuria, unusual weight change, abnormal bruising, bleeding, enlarged lymph nodes, urticaria, allergic rash, hay fever, and recurrent infections.    Vital Signs:  Patient profile:   60 year old female Height:      64 inches Weight:      181.50 pounds BMI:     31.27 O2 Sat:      100 % on Room air Temp:     97.6 degrees F oral Pulse rate:   78 / minute BP sitting:   122 / 68  (left arm) Cuff size:   regular  Vitals Entered By: Randell Loop CMA (November 24, 2010 12:04 PM)  O2 Sat at Rest %:  100 O2 Flow:  Room air CC: Yearly ROV & review of mult medical problems... Is Patient Diabetic? No Pain Assessment Patient in pain? no      Comments no changes in meds today   Physical Exam  Additional Exam:  WD, Overweight, 60 y/o WF in NAD... GENERAL:  Alert & oriented; pleasant & cooperative... HEENT:  Jet/AT, EOM-wnl, PERRLA, EACs-clear, TMs-wnl, NOSE-clear, THROAT-clear & wnl. NECK:  Supple w/ fairROM; no JVD; normal carotid impulses w/o bruits; no thyromegaly or nodules palpated; no lymphadenopathy. CHEST:  Clear to P & A; without wheezes/ rales/ or rhonchi. HEART:  Regular Rhythm; without murmurs/ rubs/ or gallops. ABDOMEN:  Soft & nontender, +panniculus; normal  bowel sounds; no organomegaly or masses detected. EXT: without deformities, mild arthritic changes; varicose veins/ +venous insuffic/ tr edema. NEURO:  CN's intact; motor testing normal; sensory testing normal; gait normal & balance OK. DERM:  No lesions noted; no rash etc...    CXR  Procedure date:  11/24/2010  Findings:      CHEST - 2 VIEW Comparison: Chest x-ray of 11/22/2009   Findings: The small nodule noted previously within the right mid upper lung field is stable and most consistent with a granuloma. No active infiltrate or effusion is seen.  Mediastinal contours are stable.  The heart is within normal limits in size.  No bony abnormality is seen.   IMPRESSION: Stable chest x-ray.  No active lung disease.   Read By:  Juline Patch,  M.D.   MISC. Report  Procedure date:  11/17/2010  Findings:       ~  FASTING Labs 11/17/10 reviewed w/ pt & attached to chart...   Impression & Recommendations:  Problem # 1:  ? of CHEST XRAY, ABNORMAL (ICD-793.1) Follow up CXR remains neg>  NAD...   Problem # 2:  HYPERTENSION (ICD-401.9) BP controlled>  continue same med. Her updated medication list for this problem includes:    Micardis Hct 40-12.5 Mg Tabs (Telmisartan-hctz) .Marland Kitchen... Take 1 tabpo once daily...  Problem # 3:  ABNORMAL ELECTROCARDIOGRAM (ICD-794.31) She has chr RBBB>  asymptomatic w/o CP, palpit, dizzy etc...  Problem # 4:  VENOUS INSUFFICIENCY (ICD-459.81) Followed by DrKrusch at the vein clinic...  Problem # 5:  HYPERLIPIDEMIA (ICD-272.4) Doing very well on the Simva+ Feno... Her updated medication list for this problem includes:    Simvastatin 40 Mg Tabs (Simvastatin) .Marland Kitchen... Take 1 tab by mouth at bedtime...    Fenofibrate 160 Mg Tabs (Fenofibrate) .Marland Kitchen... Take 1 tab by mouth once daily...  Problem # 6:  OVERWEIGHT (ICD-278.02) We reviewed diet + exercise recs...  Problem # 7:  COLONIC POLYPS (ICD-211.3) GI is stable and up to date...  Problem # 8:   FATTY LIVER DISEASE (ICD-571.8) Sl elev of SGOT & SGPT noted... discussed diet + exercise, get wt down, avoid hepatotoxins & alcohol...  Problem # 9:  LYMPHOCYTOSIS (ICD-288.8) Persist & ?sl progressive monocytosis... path review of smear in 2009 was neg... she is asymptomatic... may need referral to Heme/ DrGranfortuna soon...  Complete Medication List: 1)  Allegra 180 Mg Tabs (Fexofenadine hcl) .... Take 1 tab by mouth once daily.Marland KitchenMarland Kitchen 2)  Adult Aspirin Low Strength 81 Mg Tbdp (Aspirin) .... Take every other day 3)  Micardis Hct 40-12.5 Mg Tabs (Telmisartan-hctz) .... Take 1 tabpo once daily.Marland KitchenMarland Kitchen 4)  Simvastatin 40 Mg Tabs (Simvastatin) .... Take 1 tab by mouth at bedtime.Marland KitchenMarland Kitchen 5)  Fenofibrate 160 Mg Tabs (Fenofibrate) .... Take 1 tab by mouth once daily.Marland KitchenMarland Kitchen 6)  Estradiol 0.5 Mg Tabs (Estradiol) .... Take 1 tablet by mouth two times a day 7)  Calcium 600 600 Mg Tabs (Calcium carbonate) .... Two times a day 8)  Womens Multivitamin Plus Tabs (Multiple vitamins-minerals) .... Take 1 tablet by mouth once a day  Other Orders: T-2 View CXR ([id number])  Patient Instructions: 1)  Today we updated your med list- see below.... 2)  We refilled your meds for 90d supplies.Marland KitchenMarland Kitchen 3)  Today we reviewed your recent fasting blood work, and we did a follow up CXR - please call the "phone tree" in a few days for your  results.Marland KitchenMarland Kitchen  4)  Keep up the good work w/ diet + exercise... 5)  We gave you the seasonal Flu vaccine today... 6)  Call for any problems.Marland KitchenMarland Kitchen 7)  Please schedule a follow-up appointment in 1 year, sooner as needed. Prescriptions: FENOFIBRATE 160 MG TABS (FENOFIBRATE) take 1 tab by mouth once daily...  #90 x 4   Entered and Authorized by:   Michele Mcalpine MD   Signed by:   Michele Mcalpine MD on 11/24/2010   Method used:   Print then Give to Patient   RxID:   [id number] SIMVASTATIN 40 MG TABS (SIMVASTATIN) take 1 tab by mouth at bedtime...  #90 x 4   Entered and Authorized by:   Michele Mcalpine MD    Signed by:   Michele Mcalpine MD on 11/24/2010   Method used:   Print then Give to Patient   RxID:   [id number] MICARDIS  HCT 40-12.5 MG TABS (TELMISARTAN-HCTZ) take 1 tabpo once daily...  #90 x 4   Entered and Authorized by:   Michele Mcalpine MD   Signed by:   Michele Mcalpine MD on 11/24/2010   Method used:   Print then Give to Patient   RxID:   [id number]    Immunization History:  Influenza Immunization History:    Influenza:  historical (12/12/2009)

## 2011-01-04 NOTE — Procedures (Signed)
Summary: Upper Endoscopy  Patient: Morgan [name] Note: All result statuses are Final unless otherwise noted.  Tests: (1) Upper Endoscopy (EGD)   EGD Upper Endoscopy       DONE     [facility]     [address]     [place], [place]  [address]           ENDOSCOPY PROCEDURE REPORT           PATIENT:  Morgan, [name]  MR#:  [id number]     BIRTHDATE:  1951/04/27, 58 yrs. old  GENDER:  female           ENDOSCOPIST:  Wilhemina Bonito. Eda Keys, MD     Referred by:  Office           PROCEDURE DATE:  03/22/2010     PROCEDURE:  EGD, diagnostic     ASA CLASS:  Class II     INDICATIONS:  dysphagia, GERD  (reports dysphagia has been better     since OV)           MEDICATIONS:   There was residual sedation effect present from     prior procedure., Versed 3 mg IV     TOPICAL ANESTHETIC:  Exactacain Spray           DESCRIPTION OF PROCEDURE:   After the risks benefits and     alternatives of the procedure were thoroughly explained, informed     consent was obtained.  The [facility] [id number] endoscope was     introduced through the mouth and advanced to the second portion of     the duodenum, without limitations.  The instrument was slowly     withdrawn as the mucosa was fully examined.     <<PROCEDUREIMAGES>>           The upper, middle, and distal third of the esophagus were     carefully inspected and no abnormalities were noted. The z-line     was well seen at the GEJ. The endoscope was pushed into the fundus     which was normal including a retroflexed view. The antrum,gastric     body, first and second part of the duodenum were unremarkable.     Retroflexed views revealed no abnormalities.    The scope was then     withdrawn from the patient and the procedure completed.           COMPLICATIONS:  None           ENDOSCOPIC IMPRESSION:     1) Normal EGD     2) Gerd           RECOMMENDATIONS:     1) FOLLOW UP PRN           ______________________________     Wilhemina Bonito. Eda Keys, MD           CC:  Michele Mcalpine, MD, The Patient           n.     eSIGNEDWilhemina Bonito. Eda Keys at 03/22/2010 10:52 AM           Larrie Kass, [id number]  Note: An exclamation mark (!) indicates a result that was not dispersed into the flowsheet. Document Creation Date: 03/22/2010 10:53 AM _______________________________________________________________________  (1) Order result status: Final Collection or observation date-time: 03/22/2010 10:47 Requested date-time:  Receipt date-time:  Reported date-time:  Referring Physician:   Ordering Physician: Fransico Setters [phone])  Specimen Source:  Source: Launa Grill Order Number: [id number] Lab site:

## 2011-04-20 NOTE — Op Note (Signed)
NAME:  Morgan [name], Morgan [name]               ACCOUNT NO.:  [id number]   MEDICAL RECORD NO.:  [id number]          PATIENT TYPE:  AMB   LOCATION:  DSC                          FACILITY:  [facility]   PHYSICIAN:  Nadara Mustard, M.D.   DATE OF BIRTH:  [birth year]   DATE OF PROCEDURE:  09/14/2004  DATE OF DISCHARGE:                                 OPERATIVE REPORT   PREOPERATIVE DIAGNOSIS:  Metatarsalgia left foot second and third  metatarsals with a dislocation of the MTP joint of the second toe.   POSTOPERATIVE DIAGNOSIS:  Metatarsalgia left foot second and third  metatarsals with a dislocation of the MTP joint of the second toe.   PROCEDURE:  Left second and third toe Weil osteotomies.   SURGEON:  Nadara Mustard, M.D.   ANESTHESIA:  Popliteal block plus general.   ESTIMATED BLOOD LOSS:  Minimal.   ANTIBIOTICS:  1 gram Kefzol.   TOURNIQUET TIME:  Esmarch at the ankle for approximately 15 minutes.   DISPOSITION:  To the PACU in stable condition.   INDICATIONS FOR PROCEDURE:  The patient is a 60 year old woman with a  dislocation of the MTP joint of the second toe left foot as well as clawing  of the second and third toes with painful metatarsalgia.  The patient has  failed conservative care and presents at this time for surgical  intervention.  The risks and benefits were discussed including infection,  neurovascular injury, persistent pain, need for additional surgery.  The  patient states she understands and wishes to proceed at this time.   DESCRIPTION OF PROCEDURE:  The patient was brought to OR room 8 and  underwent a general anesthetic after a popliteal block.  After an adequate  level of anesthesia was obtained, the patient was placed in the supine  position and the left lower extremity was prepped using DuraPrep and draped  in a sterile field.  An incision was made between the second and third toes  over the second web space.  This was then bluntly carried down to the MTP  joint of  the second toe.  Small Holman retractors were placed.  A Weil  osteotomy was performed of the second metatarsal.  This allowed the  metatarsal head to translate proximally and a 12 mm spin screw was used to  stabilize the osteotomy.  A lengthening of the extensor tendon was performed  to align the MTP joint.  This reduced the MTP joint.  Attention was then  focused on the third metatarsal.  Again, blunt dissection was carried down  to the MTP joint.  Holman retractors were placed.  The oblique osteotomy was  performed of the third metatarsal Weil osteotomy and a 12 mm spin screw was  placed to stabilize the metatarsal  head after it was translated proximally.  Again, a lengthening of the extensor tendon was performed to stabilize the  MTP joint.  The alignment of the toes was appropriate postoperatively.  She  had no prominence of the metatarsal heads.  The wound was irrigated with  normal saline.  The Esmarch, which was wrapped  around the ankle for  tourniquet control, was released after 15 minutes and  hemostasis was obtained.  The incision was closed using a vertical mattress  with 2-0 nylon.  The wound was covered with Adaptic, orthopedic sponges,  sterile Webril, and a Coban dressing.  The patient was extubated and taken  to the PACU in stable condition.  Plan for discharge to home.  Follow up in  the office in two weeks.       MVD/MEDQ  D:  09/14/2004  T:  09/14/2004  Job:  [id number]

## 2011-11-14 ENCOUNTER — Ambulatory Visit (INDEPENDENT_AMBULATORY_CARE_PROVIDER_SITE_OTHER): Payer: Managed Care, Other (non HMO) | Admitting: Adult Health

## 2011-11-14 ENCOUNTER — Encounter: Payer: Self-pay | Admitting: *Deleted

## 2011-11-14 DIAGNOSIS — J111 Influenza due to unidentified influenza virus with other respiratory manifestations: Secondary | ICD-10-CM

## 2011-11-14 MED ORDER — OSELTAMIVIR PHOSPHATE 75 MG PO CAPS: 75.0000 mg | ORAL_CAPSULE | Freq: Two times a day (BID) | ORAL | Status: AC

## 2011-11-14 NOTE — Patient Instructions (Signed)
Tamiflu 75mg  Twice daily  For 5 days  Alternate Tylenol and Advil As needed  Fever/aches Push fluids  Mucinex DM Twice daily  As needed  Cough/congestion  Please contact office for sooner follow up if symptoms do not improve or worsen or seek emergency care  follow up Dr. Kriste Basque  In 4 weeks for physical.

## 2011-11-14 NOTE — Assessment & Plan Note (Signed)
Acute Influenza w/ URI   Plan;  Tamiflu 75mg  Twice daily  For 5 days  Alternate Tylenol and Advil As needed  Fever/aches Push fluids  Mucinex DM Twice daily  As needed  Cough/congestion  Please contact office for sooner follow up if symptoms do not improve or worsen or seek emergency care  follow up Dr. Kriste Basque  In 4 weeks for physical.

## 2011-11-14 NOTE — Progress Notes (Signed)
Subjective:    Patient ID: Morgan [name], female    DOB: May 31, 1951, 60 y.o.   MRN: [id number]  HPI 60  y/o WF    ~ Nov09: she had a bout of diverticulitis in Aug09 treated by DrPerry and finally resolved w/ Augmentin Rx... other than that- she has had a good year & her weight is down 12# on her diet to 199#.Marland Kitchen.   ~ November 22, 2009: she reports another good year w/o new problems or concerns... her BP is controlled on the MicardisHct & she denies CP, palpit, etc... she has had laser vein surg by DrKrusch on her right leg & will be getting the same on her left leg soon... she does note occas dysphagia/ choking & we discussed GI eval from DrPerry (she has 53yr colon due too)...   ~ November 24, 2010: she had GI eval from DrPerry 4/11 w/ EGD (normal), and Colon (mild sigm divertics & diminutive polyp= polypoid mucosa)... she continues to f/u w/ Vein specialists & DrFeatherston has done more surg & she is delighted w/ the results... her weight is down 7# to 182# today & she has been walking 2mi 5d/wk... only new complaint> right carpel tunnel symptoms & she states it's not bad enough for NCV testing, therefore rec wrist splint trial... recent labs reviewed & Lipids look great on Rx; sl incr LFTs from fatty liver dis & she is reminded to avoid Etoh...   11/14/2011 Acute OV  Complains of scratchy throat x2days, sore throat, HA, chills, temp up to 101.4 x1day. Complains of sudden onset of body aches, chills, fever 101.4. Sister dx with flu yesterday. No chest pain or dyspnea.  Appetite down w/ no n/v/d. No recent travel or abx use.  No dyspnea   PMH:   HEARING LOSS (ICD-389.9) - hx mild hi freq hearing loss- eval by DrPahel in 2004...   ALLERGIC RHINITIS (ICD-477.9) - on FEXOFENADINE 180mg /d... mild allergy symptoms...  ? of CHEST XRAY, ABNORMAL (ICD-793.1) -  ~ CXR 11/09 showed clear lungs, mild biapical pleural thickening, calcif Ao  ~ routine CXR 12/10 showed ?sm nodular opac RUL, & CTChest did  not reveal a nodule- just biapical pleuroparenchymal scarring, & fatty liver...  ~ CXR 12/11 showed   HYPERTENSION (ICD-401.9) - on MICARDIS/ HCT 40-12.5 daily... BP= 122/68 today and doing well- taking med regularly & tolerated well... denies HA, fatigue, visual changes, CP, palipit, dizziness, syncope, dyspnea, edema, etc...  ~ 2DEcho 10/01 was WNL.Marland Kitchen. norm wall thickness, norm valves, norm EF...  ~ NuclearStressTest 10/01 showed breast attenuation, normal wall motion, EF= 65%...   ABNORMAL ELECTROCARDIOGRAM (ICD-794.31) - hx intermittent RBBB/ IVCD in the past and few PVC's noted (Holter 1997)... she is asymptomatic w/o CP, palpit. dizzy. syncope, etc...  ~ EKG 12/10 w/ RBBB, otherw neg...   VENOUS INSUFFICIENCY (ICD-459.81) - on low sodium diet, elevation, compression hose, etc... she has been followed since 2007 by drKrusch et al at the Ve[facility] clinic w/ surgery including endovenous chemical ablation & sclerotherapy in 2008, 2009, 2010, & 2011> she is very happy w/ her results & note from [facility] 11/11 is reviewed.   HYPERLIPIDEMIA (ICD-272.4) - on SIMVASTATIN 40mg /d & FENOFIBRATE 160mg /d...  ~ FLP 6/08 showed TChol 213, TG 408, HDL 56, LDL 101... on Vytorin10/20, added Tricor145.  ~ FLP 11/09 on Vytor10/20+Tric145 showed TChol 146, TG 75, HDL 52, LDL 79... changed to Simva40 + [id number] for $$ reasons...  ~ FLP 12/10 on Simva40+Feno160 showed TChol 162, TG 85, HDL 54, LDL  91  ~ FLP 12/11 on Simva40+Feno160 showed TChol 149, TG 60, HDL 59, LDL 79   OVERWEIGHT (ICD-278.02) - we discussed diet + exercise therapy...  ~ peak weight = 220# in 2007  ~ weight 11/09 = 199#  ~ weight 12/10 = 189#  ~ weight 12/11 = 182#   GERD (ICD-530.81) - she uses OTC PRILOSEC 20mg  as needed...  ~ EGD in 1976 by DrSam showed mild gastritis...  ~ 12/10: she notes intermittent choking, dysphagia> EGD 4/11 by DrPerry was normal/ neg.   DIVERTICULOSIS OF COLON (ICD-562.10), & COLONIC POLYPS (ICD-211.3)  ~  colonoscopy 12/05 by DrPerry showed divertics and 2-54mm polyps= adenomatous and hyperplastic...  ~ colonoscopy 4/11 by DrPerry showed mild sigm divertics, diminutive polyp= polypoid mucosa only...   FATTY LIVER DISEASE (ICD-571.8) - she has mild elev LFTs & instructed on low fat diet, wt reduction strategy.  ~ LFTs 11/09 (wt=191#) showed AlkPhos=43, SGOT=28, SGPT=31  ~ LFTs 12/10 (wt=189#) showed AlkPhos=47, SGOT=36, SGPT=38  ~ CT Chest 1/11 showed fatty liver as an incidental finding.  ~ LFTs 12/11 (wt=182#) showed AlkPhos=44, SGOT=52, SGPT=44   Hx of CYSTITIS (ICD-595.9) - Urology eval 11/08 by [email or web address] for chr cystitis...   Hx of NECK PAIN (ICD-723.1) - eval 11/09 w/ XRays showing some spondylosis & Rx w/ Tramadol, Rest, Heat, Tylenol...   CONTACT DERMATITIS DUE TO POISON IVY (ICD-692.6)   LYMPHOCYTOSIS (ICD-288.8) - CBC's show a mononuclear lymphocytosis of ? signif...  ~ CBC 2001 w/ WBC= 7.4 & 15% lymphs, 8% monos...  ~ CBC 10/07 showed WBC= 7.6 w/ 23% lymphs, 7% monos...  ~ CBC 2/08 showed WBC= 7.8 w/ 23% lymphs, 7% monos...  ~ CBC 5/09 showed WBC= 8.7 w/ 20% lymphs, 19% monos...  ~ CBC 11/09 showed WBC= 5.5 w/ 34% lymphs, 24% monos... repeat= 21% monos and pathologist review (DrHessling)= "normal RBC, WBC, plat morphology... normal peripheral blood smear"...  ~ CBC 12/10 showed WBC= 5.5, Lymphs= 35%, Monos= 25%  ~ CBC 12/11 showed WBC= 6.3, Lymph=29%, Mono=29%   Health Maintenance: she is under some stress related to her husb smoking.  ~ GI: DrPerry & up to date on colon screening...  ~ GYN= DrMcPhail on Estrdiol 0.5, +calcium, MVI... she gets PAP, Mammograms, etc...  ~ Immunizations: she gets the yearly seasonal Flu vaccine... Pneumovax due age 60... ?last Tetanus shot.     Review of Systems Constitutional:   No  weight loss, night sweats,   ++Fevers, chills, fatigue, or  lassitude.  HEENT:   No headaches,  Difficulty swallowing,  Tooth/dental problems ++Sore throat,                 No sneezing, itching, ear ache, nasal congestion, post nasal drip,   CV:  No chest pain,  Orthopnea, PND, swelling in lower extremities, anasarca, dizziness, palpitations, syncope.   GI  No heartburn, indigestion, abdominal pain, nausea, vomiting, diarrhea, change in bowel habits, loss of appetite, bloody stools.   Resp:   No coughing up of blood.  No change in color of mucus.  No wheezing.  No chest wall deformity  Skin: no rash or lesions.  GU: no dysuria, change in color of urine, no urgency or frequency.  No flank pain, no hematuria   MS:  No joint pain or swelling.  No decreased range of motion.  No back pain.  Psych:  No change in mood or affect. No depression or anxiety.  No memory loss.         Objective:  Physical Exam  GEN: A/Ox3; pleasant , NAD, well nourished   HEENT:  [place]/AT,  EACs-clear, TMs-wnl, NOSE-clear, THROAT-clear, no lesions, no postnasal drip or exudate noted.   NECK:  Supple w/ fair ROM; no JVD; normal carotid impulses w/o bruits; no thyromegaly or nodules palpated; no lymphadenopathy.  RESP  Clear  P & A; w/o, wheezes/ rales/ or rhonchi.no accessory muscle use, no dullness to percussion  CARD:  RRR, no m/r/g  , no peripheral edema, pulses intact, no cyanosis or clubbing.  GI:   Soft & nt; nml bowel sounds; no organomegaly or masses detected.  Musco: Warm bil, no deformities or joint swelling noted.   Neuro: alert, no focal deficits noted.    Skin: Warm, no lesions or rashes        Assessment & Plan:

## 2011-11-19 ENCOUNTER — Telehealth: Payer: Self-pay | Admitting: Pulmonary Disease

## 2011-11-19 MED ORDER — AZITHROMYCIN 250 MG PO TABS: ORAL_TABLET | ORAL | Status: AC

## 2011-11-19 MED ORDER — FIRST-DUKES MOUTHWASH MT SUSP: OROMUCOSAL | Status: DC

## 2011-11-19 MED ORDER — HYDROCODONE-HOMATROPINE 5-1.5 MG/5ML PO SYRP: ORAL_SOLUTION | ORAL | Status: DC

## 2011-11-19 NOTE — Telephone Encounter (Signed)
Per SN--  She could have a secondary infection that zpak will help-----for her throat--MMW  #4oz  1 tsp gargle and swallow four times daily, for her cough--hycodan  #6oz  1 tsp every 4 hours prn cough and zpak #1  Take as directed with no refills.  If not better by Thursday--call for a work in appt.  Thanks

## 2011-11-19 NOTE — Telephone Encounter (Signed)
I spoke with Morgan [name] daughter and she states Morgan [name] has lost her voice completely, is having a lot of dry cough, severe sore throat. Morgan [name] saw TP on 11/14/11 and was given tamiflu for flu--Morgan [name] finished this yesterday. Morgan [name] is not having any fever, nausea, vomiting, body aches. Please advise Dr. Kriste Basque, thanks  No Known Allergies

## 2011-11-19 NOTE — Telephone Encounter (Signed)
I spoke with pt and is aware of sn recs and directions for each medication. Pt aware rx's have been called into pharmacy. Nothing further was needed

## 2011-11-22 ENCOUNTER — Telehealth: Payer: Self-pay | Admitting: Pulmonary Disease

## 2011-11-22 MED ORDER — HYDROCOD POLST-CHLORPHEN POLST 10-8 MG/5ML PO LQCR: 5.0000 mL | Freq: Two times a day (BID) | ORAL | Status: DC | PRN

## 2011-11-22 NOTE — Telephone Encounter (Signed)
Per SN----rest her voice, cont with MMW 4 times daily, saline nasal spray, tylenol, mucinex 2 po bid with plenty of fluids, rest, tussionex #4oz  1 tsp every 12 hours prn for cough.  recs are for pts with symptoms to stay home and rest and try not to get out unless she has to.  thanks

## 2011-11-22 NOTE — Telephone Encounter (Signed)
Spoke with pt and notified of recs per SN. She verbalized understanding and rx was called in for tussionex.

## 2011-11-22 NOTE — Telephone Encounter (Signed)
I spoke with pt and she states she was to call today for a work in apt if she was not better. Pt still has laryngitis, coughing but not as bad, little sore throat, sinus drainage. Pt has 1 day left of the zpak and is still doing the MMW and her cough syrup. Please advise Dr. Kriste Basque, thanks  No Known Allergies

## 2011-12-10 ENCOUNTER — Telehealth: Payer: Self-pay | Admitting: Pulmonary Disease

## 2011-12-10 DIAGNOSIS — J040 Acute laryngitis: Secondary | ICD-10-CM

## 2011-12-10 MED ORDER — MAGIC MOUTHWASH: ORAL | Status: DC

## 2011-12-10 NOTE — Telephone Encounter (Signed)
Per SN---its time for her to see ENT for further eval---refer to ENT --Dr. Jenne Pane or someone in that practice and rx for MMW  #4oz  1 tsp gargle and swallow four times daily and refill prn.  thanks

## 2011-12-10 NOTE — Telephone Encounter (Signed)
Spoke with pt. She states that she has had laryngitis since 11/17/11- her cough is gone but still has occ sore throat and she feels that her throat is swollen. She states that she has tried her best to rest voice. Wants to know what the next step will be. Please advise, thanks!

## 2011-12-10 NOTE — Telephone Encounter (Signed)
Spoke with pt and notified of recs per SN. Pt verbalized understanding. Rx and referral to [facility] sent.

## 2012-01-04 ENCOUNTER — Telehealth: Payer: Self-pay | Admitting: Pulmonary Disease

## 2012-01-04 DIAGNOSIS — Z0000 Encounter for general adult medical examination without abnormal findings: Secondary | ICD-10-CM

## 2012-01-04 NOTE — Telephone Encounter (Signed)
Pt has cpx sched for 2/6 and wants to come in prior for labs. Please advise, thanks

## 2012-01-04 NOTE — Telephone Encounter (Signed)
lmomtcb for pt to see when she wants to come in for labs.

## 2012-01-07 NOTE — Telephone Encounter (Signed)
Pt called back and she wants to come in tomorrow for her fasting labs. Pt is aware that labs are in the computer for her.

## 2012-01-08 ENCOUNTER — Ambulatory Visit: Payer: Managed Care, Other (non HMO)

## 2012-01-08 ENCOUNTER — Other Ambulatory Visit (INDEPENDENT_AMBULATORY_CARE_PROVIDER_SITE_OTHER): Payer: Managed Care, Other (non HMO)

## 2012-01-08 DIAGNOSIS — Z0000 Encounter for general adult medical examination without abnormal findings: Secondary | ICD-10-CM

## 2012-01-08 LAB — CBC WITH DIFFERENTIAL/PLATELET
Basophils Absolute: 0 10*3/uL (ref 0.0–0.1)
Basophils Relative: 0 % (ref 0–1)
Eosinophils Absolute: 0.1 10*3/uL (ref 0.0–0.7)
Eosinophils Relative: 1 % (ref 0–5)
HCT: 37.4 % (ref 36.0–46.0)
MCH: 30.6 pg (ref 26.0–34.0)
MCHC: 33.7 g/dL (ref 30.0–36.0)
Monocytes Absolute: 2.2 10*3/uL — ABNORMAL HIGH (ref 0.1–1.0)
Monocytes Relative: 27 % — ABNORMAL HIGH (ref 3–12)
Neutro Abs: 3.4 10*3/uL (ref 1.7–7.7)
RDW: 14.4 % (ref 11.5–15.5)

## 2012-01-08 LAB — LIPID PANEL
Cholesterol: 150 mg/dL (ref 0–200)
HDL: 53.9 mg/dL (ref >39.00)
Triglycerides: 85 mg/dL (ref 0.0–149.0)

## 2012-01-08 LAB — HEPATIC FUNCTION PANEL
Albumin: 4 g/dL (ref 3.5–5.2)
Alkaline Phosphatase: 53 U/L (ref 39–117)
Total Protein: 7.3 g/dL (ref 6.0–8.3)

## 2012-01-08 LAB — BASIC METABOLIC PANEL
CO2: 29 mEq/L (ref 19–32)
Calcium: 9.8 mg/dL (ref 8.4–10.5)
Creatinine, Ser: 1.2 mg/dL (ref 0.4–1.2)
Glucose, Bld: 103 mg/dL — ABNORMAL HIGH (ref 70–99)

## 2012-01-09 ENCOUNTER — Ambulatory Visit (INDEPENDENT_AMBULATORY_CARE_PROVIDER_SITE_OTHER): Payer: Managed Care, Other (non HMO) | Admitting: Pulmonary Disease

## 2012-01-09 ENCOUNTER — Ambulatory Visit (INDEPENDENT_AMBULATORY_CARE_PROVIDER_SITE_OTHER)
Admission: RE | Admit: 2012-01-09 | Discharge: 2012-01-09 | Disposition: A | Discharge status: Home / Self Care | Payer: Managed Care, Other (non HMO) | Source: Ambulatory Visit | Attending: Pulmonary Disease | Admitting: Pulmonary Disease

## 2012-01-09 ENCOUNTER — Encounter: Payer: Self-pay | Admitting: Pulmonary Disease

## 2012-01-09 VITALS — BP 128/74 | HR 85 | Temp 97.1°F | Ht 64.0 in | Wt 199.6 lb

## 2012-01-09 DIAGNOSIS — Z0000 Encounter for general adult medical examination without abnormal findings: Secondary | ICD-10-CM

## 2012-01-09 DIAGNOSIS — I10 Essential (primary) hypertension: Secondary | ICD-10-CM

## 2012-01-09 DIAGNOSIS — I872 Venous insufficiency (chronic) (peripheral): Secondary | ICD-10-CM

## 2012-01-09 DIAGNOSIS — E663 Overweight: Secondary | ICD-10-CM

## 2012-01-09 DIAGNOSIS — E785 Hyperlipidemia, unspecified: Secondary | ICD-10-CM

## 2012-01-09 DIAGNOSIS — Z23 Encounter for immunization: Secondary | ICD-10-CM

## 2012-01-09 DIAGNOSIS — K219 Gastro-esophageal reflux disease without esophagitis: Secondary | ICD-10-CM

## 2012-01-09 DIAGNOSIS — D7289 Other specified disorders of white blood cells: Secondary | ICD-10-CM

## 2012-01-09 DIAGNOSIS — K5730 Diverticulosis of large intestine without perforation or abscess without bleeding: Secondary | ICD-10-CM

## 2012-01-09 DIAGNOSIS — D126 Benign neoplasm of colon, unspecified: Secondary | ICD-10-CM

## 2012-01-09 LAB — VITAMIN D 25 HYDROXY (VIT D DEFICIENCY, FRACTURES): Vit D, 25-Hydroxy: 48 ng/mL (ref 30–89)

## 2012-01-09 MED ORDER — TELMISARTAN-HCTZ 40-12.5 MG PO TABS: 1.0000 | ORAL_TABLET | Freq: Every day | ORAL | Status: DC

## 2012-01-09 MED ORDER — SIMVASTATIN 40 MG PO TABS: 40.0000 mg | ORAL_TABLET | Freq: Every day | ORAL | Status: DC

## 2012-01-09 MED ORDER — FENOFIBRATE 160 MG PO TABS: 160.0000 mg | ORAL_TABLET | Freq: Every day | ORAL | Status: DC

## 2012-01-09 NOTE — Progress Notes (Signed)
Subjective:     Patient ID: Morgan [name], female   DOB: 1951-04-14, 61 y.o.   MRN: [id number]  HPI 61 y/o WF here for a follow up visit & CPX... she has multiple medical problems as noted below...    ~  November 22, 2009:  she reports another good year w/o new problems or concerns... her BP is controlled on the MicardisHct & she denies CP, palpit, etc...  she has had laser vein surg by DrKrusch on her right leg & will be getting the same on her left leg soon...  she does note occas dysphagia/ choking & we discussed GI eval from DrPerry (she has 45yr colon due too)...  ~  November 24, 2010:  she had GI eval from DrPerry 4/11 w/ EGD (normal), and Colon (mild sigm divertics & diminutive polyp= polypoid mucosa)... she continues to f/u w/ Vein specialists & DrFeatherston has done more surg & she is delighted w/ the results... her weight is down 7# to 182# today & she has been walking 2mi 5d/wk... only new complaint> right carpel tunnel symptoms & she states it's not bad enough for NCV testing, therefore rec wrist splint trial... recent labs reviewed & Lipids look great on Rx;  sl incr LFTs from fatty liver dis & she is reminded to avoid Etoh...  ~  January 09, 2012:  Yearly ROV & CPX> she reports ILI 12/12 treated w/ Tamiflu +OTC meds; then refractory sore throat & laryngitis treated by DrBates (we don't have notes) and f/u due soon, on Prevacid Bid & resting her voice- improved...    <HBP> on MicardisHCT 40/12.5 & BP= 128/74; she denies CP, palpit, dizzy, SOB, edema, etc...    <VenInsuffic> she remains under the care of DrFeatherston & the [facility] Clinic...    <Hyperlipid> on Simva40 + Feno160; FLP looks good w/ all parameters wnl...    <Overweight> wt is up 18# to 200# & we reviewed diet, exercise, etc...    <GI- GERD, Divertics, Polyps, FLD> on Prev30Bid & must lose weight; LFTs remain sl elev w/ her wt gain...    <Lymphocytosis> CBCs & smear reviewed; no change in her counts or blood  smear...   Problem List:     HEARING LOSS (ICD-389.9) - hx mild hi freq hearing loss- eval by DrPahel in 2004...  ALLERGIC RHINITIS (ICD-477.9) - on FEXOFENADINE 180mg /d... mild allergy symptoms...  ? of CHEST XRAY, ABNORMAL (ICD-793.1) -  ~  CXR 11/09 showed clear lungs, mild biapical pleural thickening, calcif Ao ~  routine CXR 12/10 showed ?sm nodular opac RUL, & CTChest did not reveal a nodule- just biapical pleuroparenchymal scarring, & fatty liver... ~  CXR 12/11 showed ?sm granuloma RUL, no active lung dis... ~  CXR 2/13 showed normal heart size, clear lungs, NAD...  HYPERTENSION (ICD-401.9) - on MICARDIS/ HCT 40-12.5 daily... BP= 128/74 today and doing well- taking med regularly & tolerated well... denies HA, fatigue, visual changes, CP, palipit, dizziness, syncope, dyspnea, edema, etc...  ~  2DEcho 10/01 was WNL.Marland Kitchen. norm wall thickness, norm valves, norm EF... ~  NuclearStressTest 10/01 showed breast attenuation, normal wall motion, EF= 65%...  ABNORMAL ELECTROCARDIOGRAM (ICD-794.31) - hx intermittent RBBB/ IVCD in the past and few PVC's noted (Holter 1997)... she is asymptomatic w/o CP, palpit. dizzy. syncope, etc... ~  EKG 12/10 w/ RBBB, otherw neg...  VENOUS INSUFFICIENCY (ICD-459.81) - on low sodium diet, elevation, compression hose, etc... she has been followed since 2007 by DrKrusch et al at the Vein clinic w/ surgery  including endovenous chemical ablation & sclerotherapy in 2008, 2009, 2010, & 2011> she is very happy w/ her results & note from [facility] 11/11 is reviewed.  HYPERLIPIDEMIA (ICD-272.4) - on SIMVASTATIN 40mg /d & FENOFIBRATE 160mg /d... ~  FLP 6/08 showed TChol 213, TG 408, HDL 56, LDL 101... on Vytorin10/20, added Tricor145. ~  FLP 11/09 on Vytor10/20+Tric145 showed TChol 146, TG 75, HDL 52, LDL 79... changed to Simva40 + [id number] for $$ reasons... ~  FLP 12/10 on Simva40+Feno160 showed TChol 162, TG 85, HDL 54, LDL 91 ~  FLP 12/11 on Simva40+Feno160  showed TChol 149, TG 60, HDL 59, LDL 79 ~  FLP 2/13 on Simva40+Feno160 showed TChol 150, TG 85, HDL 54, LDL 79  OVERWEIGHT (ICD-278.02) - we discussed diet + exercise therapy... ~  peak weight = 220# in 2007 ~  weight 11/09 = 199# ~  weight 12/10 = 189#... Good job, keep it up! ~  weight 12/11 = 182# ~  Weight 2/13 = 200#... We reviewed need for diet/ exercise...  GERD (ICD-530.81) - currently on PREVACID 30mg  Bid per DrBates for LER... ~  EGD in 1976 by DrSam showed mild gastritis... ~  12/10: she notes intermittent choking, dysphagia> EGD 4/11 by DrPerry was normal/ neg. ~  12/12: eval by DrBates for throat symptoms believed due to LER & placed on Prev30Bid...  DIVERTICULOSIS OF COLON (ICD-562.10), & COLONIC POLYPS (ICD-211.3) ~  colonoscopy 12/05 by DrPerry showed divertics and 2-14mm polyps= adenomatous and hyperplastic... ~  8/09: bout of diverticulitis treated by DrPerry... ~  colonoscopy 4/11 by DrPerry showed mild sigm divertics, diminutive polyp= polypoid mucosa only...  FATTY LIVER DISEASE (ICD-571.8) - she has mild elev LFTs & instructed on low fat diet, wt reduction strategy. ~  LFTs 11/09 (wt=191#) showed AlkPhos=43, SGOT=28, SGPT=31 ~  LFTs 12/10 (wt=189#) showed AlkPhos=47, SGOT=36, SGPT=38 ~  CT Chest 1/11 showed fatty liver as an incidental finding. ~  LFTs 12/11 (wt=182#) showed AlkPhos=44, SGOT=52, SGPT=44 ~  LFTs 2/13 (wt=200#) showed AlkPhos= 53, SGOT=48, SGPT=55... Must get weight down!  Hx of CYSTITIS (ICD-595.9) - Urology eval 11/08 by WGNFAOZH for chr cystitis...  Hx of NECK PAIN (ICD-723.1) - eval 11/09 w/ XRays showing some spondylosis & Rx w/ Tramadol, Rest, Heat, Tylenol...  CONTACT DERMATITIS DUE TO POISON IVY (ICD-692.6)  LYMPHOCYTOSIS (ICD-288.8) - CBC's show a mononuclear lymphocytosis of ? signif... ~  CBC 2001 w/ WBC= 7.4 & 15% lymphs, 8% monos... ~  CBC 10/07 showed WBC= 7.6 w/ 23% lymphs, 7% monos... ~  CBC 2/08 showed WBC= 7.8 w/ 23% lymphs, 7%  monos... ~  CBC 5/09 showed WBC= 8.7 w/ 20% lymphs, 19% monos... ~  CBC 11/09 showed WBC= 5.5 w/ 34% lymphs, 24% monos... repeat= 21% monos and pathologist review (DrHessling)= "normal RBC, WBC, plat morphology... normal peripheral blood smear"... ~  CBC 12/10 showed WBC= 5.5, Lymphs= 35%, Monos= 25% ~  CBC 12/11 showed WBC= 6.3, Lymph=29%, Mono=29% ~  CBC 2/13 showed WBC= 8.2, Lymph=32%, Mono=27%... Smear reviewed by path> no abn cells...  Health Maintenance:   she is under some stress related to her husb smoking. ~  GI:  DrPerry & up to date on colon screening... ~  GYN= DrMcPhail on Estrdiol 0.5, +calcium, MVI... she gets PAP, Mammograms, etc... ~  Immunizations:  she gets the yearly seasonal Flu vaccine... Pneumovax due age 59... ?last Tetanus shot.   Past Surgical History  Procedure Date  . Total abdominal hysterectomy 12/86  . Inner ear surgery  childhood  . Nasal septum surgery   . Foot surgery   . Chemical and laser endovenous ablation of le veins 2008, 2009, 2010, 2011    Dr Verl Blalock al    Outpatient Encounter Prescriptions as of 01/09/2012  Medication Sig Dispense Refill  . Alum & Mag Hydroxide-Simeth (MAGIC MOUTHWASH) SOLN 1 tsp gargle and swallow four times per day   120 mL  11  . aspirin 81 MG tablet Take 81 mg by mouth daily.        . calcium carbonate (OS-CAL) 600 MG TABS Take 2 tablets by mouth daily.        . chlorpheniramine-HYDROcodone (TUSSIONEX PENNKINETIC ER) 10-8 MG/5ML LQCR Take 5 mLs by mouth every 12 (twelve) hours as needed.  120 mL  0  . Diphenhyd-Hydrocort-Nystatin (FIRST-DUKES MOUTHWASH) SUSP 1 tsp gargle and swallow 4 times a day  120 mL  0  . estradiol (ESTRACE) 0.5 MG tablet Take 0.5 mg by mouth daily.        . fenofibrate 160 MG tablet Take 160 mg by mouth daily.        Marland Kitchen HYDROcodone-homatropine (HYCODAN) 5-1.5 MG/5ML syrup 1 tsp every 4 hrs as needed for cough  180 mL  0  . Multiple Vitamin (MULTIVITAMIN) tablet Take 1 tablet by mouth daily.         . simvastatin (ZOCOR) 40 MG tablet Take 40 mg by mouth at bedtime.        Marland Kitchen telmisartan-hydrochlorothiazide (MICARDIS HCT) 40-12.5 MG per tablet Take 1 tablet by mouth daily.          No Known Allergies   Current Medications, Allergies, Past Medical History, Past Surgical History, Family History, and Social History were reviewed in [employer] record.   Review of Systems         The patient complains of nocturia, arthritis, and anxiety.  The patient denies fever, chills, sweats, anorexia, fatigue, weakness, malaise, weight loss, sleep disorder, blurring, diplopia, eye irritation, eye discharge, vision loss, eye pain, photophobia, earache, ear discharge, tinnitus, decreased hearing, nasal congestion, nosebleeds, sore throat, hoarseness, chest pain, palpitations, syncope, dyspnea on exertion, orthopnea, PND, peripheral edema, cough, dyspnea at rest, excessive sputum, hemoptysis, wheezing, pleurisy, nausea, vomiting, diarrhea, constipation, change in bowel habits, abdominal pain, melena, hematochezia, jaundice, gas/bloating, indigestion/heartburn, dysphagia, odynophagia, dysuria, hematuria, urinary frequency, urinary hesitancy, incontinence, back pain, joint pain, joint swelling, muscle cramps, muscle weakness, stiffness, sciatica, restless legs, leg pain at night, leg pain with exertion, rash, itching, dryness, suspicious lesions, paralysis, paresthesias, seizures, tremors, vertigo, transient blindness, frequent falls, frequent headaches, difficulty walking, depression, memory loss, confusion, cold intolerance, heat intolerance, polydipsia, polyphagia, polyuria, unusual weight change, abnormal bruising, bleeding, enlarged lymph nodes, urticaria, allergic rash, hay fever, and recurrent infections.     Objective:   Physical Exam     WD, Overweight, 61 y/o WF in NAD... GENERAL:  Alert & oriented; pleasant & cooperative... HEENT:  [place]/AT, EOM-wnl, PERRLA, EACs-clear, TMs-wnl,  NOSE-clear, THROAT-clear & wnl. NECK:  Supple w/ fairROM; no JVD; normal carotid impulses w/o bruits; no thyromegaly or nodules palpated; no lymphadenopathy. CHEST:  Clear to P & A; without wheezes/ rales/ or rhonchi. HEART:  Regular Rhythm; without murmurs/ rubs/ or gallops. ABDOMEN:  Soft & nontender, +panniculus; normal bowel sounds; no organomegaly or masses detected. EXT: without deformities, mild arthritic changes; varicose veins/ +venous insuffic/ tr edema. NEURO:  CN's intact; motor testing normal; sensory testing normal; gait normal & balance OK. DERM:  No lesions noted; no  rash etc...  RADIOLOGY DATA:  Reviewed in the EPIC EMR & discussed w/ the patient...    >>CXR 2/13 norm heart size, clear lungs, NAD...  LABORATORY DATA:  Reviewed in the EPIC EMR & discussed w/ the patient...    >>LABS: FLP- ok on Simva40+Feno160;  CBC w/ incr monos- stable no ch from last yr;  Chems/ TSH/ VitD- all ok x borderline LFTs.   Assessment:     Physical Exam >>  AR & Laryngitis>  Eval & Rx by DrBates; on MMW, PPI Bid, etc; she has f/u w/ ENT soon...  Hx Abn CXR>  Small RUL granuloma & bilat apic pleural thickening; no acute changes...  HBP>  Controlled on the MicardisHCT; advised low sodium & get wt down!!!  Ven Insuffic>  Followed by drFeatherston at [facility]...  Hyperlipid>  On Simva40 + Feno160 & FLP looks great; needs better diet & wt reduction...  Overweight>  Wt up to 200# & she knows the drill: count calories, eat less, exercise, burn more...  GI> GERD, Divertics, Polyps, FLD>  Continue PPI Rx anf needs to lose wt to improve the LFTs...  LYMPHOCYTOSIS>  CBC is stable w/ mononuclear lymphocytosis, no change, smear reviewed by pathologist- ok no abn cells...     Plan:     Patient's Medications  New Prescriptions   No medications on file  Previous Medications   ASPIRIN 81 MG TABLET    Take 81 mg by mouth daily.     CALCIUM CARBONATE (OS-CAL) 600 MG TABS    Take 2 tablets by  mouth daily.     LANSOPRAZOLE (PREVACID) 30 MG CAPSULE    Take 30 mg by mouth 2 (two) times daily. 30 minutes before breakfast and dinner   MULTIPLE VITAMIN (MULTIVITAMIN) TABLET    Take 1 tablet by mouth daily.    Modified Medications   Modified Medication Previous Medication   FENOFIBRATE 160 MG TABLET fenofibrate 160 MG tablet      Take 1 tablet (160 mg total) by mouth daily.    Take 160 mg by mouth daily.     SIMVASTATIN (ZOCOR) 40 MG TABLET simvastatin (ZOCOR) 40 MG tablet      Take 1 tablet (40 mg total) by mouth at bedtime.    Take 40 mg by mouth at bedtime.     TELMISARTAN-HYDROCHLOROTHIAZIDE (MICARDIS HCT) 40-12.5 MG PER TABLET telmisartan-hydrochlorothiazide (MICARDIS HCT) 40-12.5 MG per tablet      Take 1 tablet by mouth daily.    Take 1 tablet by mouth daily.    Discontinued Medications   ALUM & MAG HYDROXIDE-SIMETH (MAGIC MOUTHWASH) SOLN    1 tsp gargle and swallow four times per day    CHLORPHENIRAMINE-HYDROCODONE (TUSSIONEX PENNKINETIC ER) 10-8 MG/5ML LQCR    Take 5 mLs by mouth every 12 (twelve) hours as needed.   DIPHENHYD-HYDROCORT-NYSTATIN (FIRST-DUKES MOUTHWASH) SUSP    1 tsp gargle and swallow 4 times a day   ESTRADIOL (ESTRACE) 0.5 MG TABLET    Take 0.5 mg by mouth daily.     HYDROCODONE-HOMATROPINE (HYCODAN) 5-1.5 MG/5ML SYRUP    1 tsp every 4 hrs as needed for cough

## 2012-01-09 NOTE — Patient Instructions (Signed)
Today we updated your med list in our EPIC system...    Continue your current medications the same...  We reviewed your recent blood work 7 gave you a copy for your records...  Today we did your follow up CXR...    Please call the [facility] in a few days for your results...    Dial [id number] & when prompted enter your patient number followed by the # symbol...    Your patient number is:  [phone]  Call for any questions...  Let's plan another physical in one years time.Marland KitchenMarland Kitchen

## 2012-01-27 ENCOUNTER — Encounter: Payer: Self-pay | Admitting: Pulmonary Disease

## 2012-05-20 ENCOUNTER — Telehealth: Payer: Self-pay | Admitting: Pulmonary Disease

## 2012-05-20 MED ORDER — METRONIDAZOLE 500 MG PO TABS: 500.0000 mg | ORAL_TABLET | Freq: Three times a day (TID) | ORAL | Status: AC

## 2012-05-20 NOTE — Telephone Encounter (Signed)
Per SN---rest at home, increase fluids, activia yogurt daily, align once daily and call in flagyl 500mg   #21  1 po tid until gone.  thanks

## 2012-05-20 NOTE — Telephone Encounter (Signed)
Spoke with pt. She states having fever (up to 100), chills, and aches for the past 3 days. She states has had a little diarrhea "but not too bad". She states that she spent some time at the Hospital with her Mother over the w/e and thinks that she caught something while she was there. I advised tylenol as directed for fever/aches, increase recs and fluids. SN, any other recs? Please advise, thanks! No Known Allergies

## 2012-05-20 NOTE — Telephone Encounter (Signed)
Spoke with pt and notified of recs per SN. She verbalized understanding and states nothing further needed. Rx for flagyl sent to pharm.

## 2012-06-11 ENCOUNTER — Encounter: Payer: Self-pay | Admitting: Internal Medicine

## 2012-06-18 ENCOUNTER — Encounter: Payer: Self-pay | Admitting: Nurse Practitioner

## 2012-06-18 ENCOUNTER — Ambulatory Visit (INDEPENDENT_AMBULATORY_CARE_PROVIDER_SITE_OTHER): Payer: Managed Care, Other (non HMO) | Admitting: Nurse Practitioner

## 2012-06-18 ENCOUNTER — Other Ambulatory Visit (INDEPENDENT_AMBULATORY_CARE_PROVIDER_SITE_OTHER): Payer: Managed Care, Other (non HMO)

## 2012-06-18 ENCOUNTER — Telehealth: Payer: Self-pay | Admitting: Internal Medicine

## 2012-06-18 VITALS — BP 126/70 | HR 84 | Temp 97.9°F | Ht 64.0 in | Wt 197.0 lb

## 2012-06-18 DIAGNOSIS — K219 Gastro-esophageal reflux disease without esophagitis: Secondary | ICD-10-CM

## 2012-06-18 DIAGNOSIS — R197 Diarrhea, unspecified: Secondary | ICD-10-CM

## 2012-06-18 DIAGNOSIS — R109 Unspecified abdominal pain: Secondary | ICD-10-CM

## 2012-06-18 DIAGNOSIS — R509 Fever, unspecified: Secondary | ICD-10-CM

## 2012-06-18 DIAGNOSIS — R1030 Lower abdominal pain, unspecified: Secondary | ICD-10-CM | POA: Insufficient documentation

## 2012-06-18 LAB — COMPREHENSIVE METABOLIC PANEL
ALT: 30 U/L (ref 0–35)
Albumin: 3.9 g/dL (ref 3.5–5.2)
Alkaline Phosphatase: 51 U/L (ref 39–117)
CO2: 27 mEq/L (ref 19–32)
Glucose, Bld: 112 mg/dL — ABNORMAL HIGH (ref 70–99)
Potassium: 4.8 mEq/L (ref 3.5–5.1)
Sodium: 139 mEq/L (ref 135–145)
Total Bilirubin: 0.3 mg/dL (ref 0.3–1.2)
Total Protein: 7.6 g/dL (ref 6.0–8.3)

## 2012-06-18 LAB — CBC WITH DIFFERENTIAL/PLATELET
MCHC: 33.5 g/dL (ref 30.0–36.0)
MCV: 91.8 fl (ref 78.0–100.0)
Platelets: 198 10*3/uL (ref 150.0–400.0)
RBC: 3.85 Mil/uL — ABNORMAL LOW (ref 3.87–5.11)
WBC: 7.6 10*3/uL (ref 4.5–10.5)

## 2012-06-18 NOTE — Progress Notes (Signed)
Morgan [name] [id number] 05/01/1951   HISTORY OR PRESENT ILLNESS :  Patient is a 61 year old female known to Dr. Marina Goodell for a history of adenomatous colon polyps and diverticulitis. She had a normal EGD in April 2011 for dysphagia. Her last colonoscopy was December 2012 . Findings included diverticulosis and a small non-adenomatous polyp  One month ago patient developed fever, chills, mild diarrhea and diffuse lower abdominal pain after visiting relative in hospital.  PCP gave her Flagyl, symptoms improved. Then five days ago she developed recurrent fever, lower abdominal pain and diarrhea. Diarrhea much worse this time. She is nauseated. Flagyl was restarted by PCP, he added Cipro this time. So far, no improvement in diarrhea or pain though fevers have abated. Patient had 6 non-bloody loose stools yesterday and has had 5 this am. Her lower abdominal pain is constant, not related to meals. No vaginal discharge, no urinary symptoms.  She has also been having worsening reflux symptoms.    Current Medications, Allergies, Past Medical History, Past Surgical History, Family History and Social History were reviewed in [employer] record.   PHYSICAL EXAMINATION : General:  Well developed  female in no acute distress Head: Normocephalic and atraumatic Eyes:  sclerae anicteric,conjunctive pink. Ears: Normal auditory acuity Neck: Supple, no masses.  Lungs: Clear throughout to auscultation Heart: Regular rate and rhythm Abdomen: Soft, nondistended, moderate mid lower abdominal tenderness.  No masses or hepatomegaly noted. Normal bowel sounds Rectal: not done Musculoskeletal: Symmetrical with no gross deformities  Skin: No lesions on visible extremities Extremities: No edema or deformities noted Neurological: Oriented x 4, grossly nonfocal Cervical Nodes:  No significant cervical adenopathy Psychological:  Alert and cooperative. Normal mood and affect  ASSESSMENT AND PLAN  :   Recurrent fever, diarrhea and diffuse lower abdominal pain over the last month. This is second episode in a month. The first episode resolved with antibiotics. Will check stool studies, CBC, CMET and urinalysis. She will be tentatively scheduled for CTscan of abdomen and pelvis. If stool studies are positive will cancel the CTscan. She can use Immodium BID as needed. Patient will follow up with Dr. Marina Goodell in a few weeks. In the meantime we will call her with test results and further recommendations.

## 2012-06-18 NOTE — Telephone Encounter (Signed)
Pt states she has been sick since 05/19/12. Dr. Kriste Basque has given her antibiotics on 2 different occasions. Pt states she is still having abdominal pain, nausea, and diarrhea. Pt requesting to be seen. Pt scheduled to see Willette Cluster NP today at 11am. Pt aware of appt date and time.

## 2012-06-18 NOTE — Patient Instructions (Addendum)
Please go to the basement level to have your labs drawn.  Imodium take twice daily as needed for diarrhea. We have given you an anti-reflux brochure.  Take the Prevacid daily before breakfast. Keep appt with Dr. Marina Goodell on 07-15-2012 @ 9:30 AM . We have scheduled the CT scan for Fri 06-20-2012.  You have been scheduled for a CT scan of the abdomen and pelvis at [facility] ([address]---this is in the same building as [facility]).   You are scheduled on 06-20-2012 at 11;30 AM  You should arrive 15 minutes prior to your appointment at 11:15 Am time for registration. Please follow the written instructions below on the day of your exam:  WARNING: IF YOU ARE ALLERGIC TO IODINE/X-RAY DYE, PLEASE NOTIFY RADIOLOGY IMMEDIATELY AT [phone]! YOU WILL BE GIVEN A 13 HOUR PREMEDICATION PREP.  1) Do not eat or drink anything after 7:30 Am  (4 hours prior to your test) 2) You have been given 2 bottles of oral contrast to drink. The solution may taste better if refrigerated, but do NOT add ice or any other liquid to this solution. Shake  well before drinking.    Drink 1 bottle of contrast @ 9:30 AM  (2 hours prior to your exam)  Drink 1 bottle of contrast @ 10:30  AM  (1 hour prior to your exam)  You may take any medications as prescribed with a small amount of water except for the following: Metformin, Glucophage, Glucovance, Avandamet, Riomet, Fortamet, Actoplus Met, Janumet, Glumetza or Metaglip. The above medications must be held the day of the exam AND 48 hours after the exam.  The purpose of you drinking the oral contrast is to aid in the visualization of your intestinal tract. The contrast solution may cause some diarrhea. Before your exam is started, you will be given a small amount of fluid to drink. Depending on your individual set of symptoms, you may also receive an intravenous injection of x-ray contrast/dye. Plan on being at [facility] for 30 minutes or long,  depending on the type of exam you are having performed.  If you have any questions regarding your exam or if you need to reschedule, you may call the CT department at [phone] between the hours of 8:00 am and 5:00 pm, Monday-Friday.  ________________________________________________________________________

## 2012-06-19 ENCOUNTER — Other Ambulatory Visit: Payer: Managed Care, Other (non HMO)

## 2012-06-19 DIAGNOSIS — K219 Gastro-esophageal reflux disease without esophagitis: Secondary | ICD-10-CM

## 2012-06-19 DIAGNOSIS — R197 Diarrhea, unspecified: Secondary | ICD-10-CM

## 2012-06-19 DIAGNOSIS — R509 Fever, unspecified: Secondary | ICD-10-CM

## 2012-06-19 DIAGNOSIS — R1030 Lower abdominal pain, unspecified: Secondary | ICD-10-CM

## 2012-06-19 NOTE — Progress Notes (Signed)
Reviewed and agree with management plan. Etoile Looman T. Oliva Montecalvo MD FACG 

## 2012-06-20 ENCOUNTER — Ambulatory Visit (INDEPENDENT_AMBULATORY_CARE_PROVIDER_SITE_OTHER)
Admission: RE | Admit: 2012-06-20 | Discharge: 2012-06-20 | Disposition: A | Discharge status: Home / Self Care | Payer: Managed Care, Other (non HMO) | Source: Ambulatory Visit | Attending: Nurse Practitioner | Admitting: Nurse Practitioner

## 2012-06-20 DIAGNOSIS — R109 Unspecified abdominal pain: Secondary | ICD-10-CM

## 2012-06-20 DIAGNOSIS — R1030 Lower abdominal pain, unspecified: Secondary | ICD-10-CM

## 2012-06-20 DIAGNOSIS — K219 Gastro-esophageal reflux disease without esophagitis: Secondary | ICD-10-CM

## 2012-06-20 DIAGNOSIS — R197 Diarrhea, unspecified: Secondary | ICD-10-CM

## 2012-06-20 DIAGNOSIS — R509 Fever, unspecified: Secondary | ICD-10-CM

## 2012-06-20 LAB — CLOSTRIDIUM DIFFICILE BY PCR: Toxigenic C. Difficile by PCR: NOT DETECTED

## 2012-06-20 LAB — OVA AND PARASITE SCREEN

## 2012-06-20 MED ORDER — IOHEXOL 300 MG/ML  SOLN
100.0000 mL | Freq: Once | INTRAMUSCULAR | Status: AC | PRN
  Administered 2012-06-20: 100 mL via INTRAVENOUS

## 2012-06-23 ENCOUNTER — Telehealth: Payer: Self-pay | Admitting: Internal Medicine

## 2012-06-23 NOTE — Telephone Encounter (Signed)
See result note.  

## 2012-06-24 ENCOUNTER — Ambulatory Visit (INDEPENDENT_AMBULATORY_CARE_PROVIDER_SITE_OTHER): Payer: Managed Care, Other (non HMO) | Admitting: Internal Medicine

## 2012-06-24 ENCOUNTER — Encounter: Payer: Self-pay | Admitting: Internal Medicine

## 2012-06-24 ENCOUNTER — Telehealth: Payer: Self-pay | Admitting: Internal Medicine

## 2012-06-24 VITALS — BP 132/74 | HR 88 | Ht 64.0 in | Wt 196.0 lb

## 2012-06-24 DIAGNOSIS — R197 Diarrhea, unspecified: Secondary | ICD-10-CM

## 2012-06-24 DIAGNOSIS — R933 Abnormal findings on diagnostic imaging of other parts of digestive tract: Secondary | ICD-10-CM

## 2012-06-24 DIAGNOSIS — K644 Residual hemorrhoidal skin tags: Secondary | ICD-10-CM

## 2012-06-24 DIAGNOSIS — R1012 Left upper quadrant pain: Secondary | ICD-10-CM

## 2012-06-24 DIAGNOSIS — K602 Anal fissure, unspecified: Secondary | ICD-10-CM

## 2012-06-24 DIAGNOSIS — K625 Hemorrhage of anus and rectum: Secondary | ICD-10-CM

## 2012-06-24 MED ORDER — ALIGN PO CAPS: 1.0000 | ORAL_CAPSULE | Freq: Every day | ORAL | Status: DC

## 2012-06-24 MED ORDER — HYDROCORTISONE ACETATE 25 MG RE SUPP: 25.0000 mg | Freq: Two times a day (BID) | RECTAL | Status: AC | PRN

## 2012-06-24 NOTE — Patient Instructions (Addendum)
We have sent the following medications to your pharmacy for you to pick up at your convenience: Anusol [facility] suppositories  We have given you samples of Align. This puts good bacteria back into your colon. You should take 1 capsule by mouth once daily. If this works well for you, it can be purchased over the counter.  You may take a fiber supplement like Benefiber or Metamucil over the counter, 1-2 tablespoons every day

## 2012-06-24 NOTE — Progress Notes (Signed)
HISTORY OF PRESENT ILLNESS:  Morgan [name] is a 61 y.o. female with the below listed medical history who has been followed by myself for a history of colon polyps and GERD. Her last colonoscopy was December 2012. She was found to have mild diverticulosis and small non-adenomatous colon polyp. Followup in 5 years recommended. She was seen 6 days ago by our nurse practitioner for problems with abdominal pain and diarrhea. This after developing fever and diarrhea. She was treated with metronidazole without improvement. Probiotic as well. CT scan suggested uncomplicated diverticulitis. As well, fatty liver. She has had mild transaminitis in the past. Stool studies all negative except for presence of Blastocystis hominis. She completed a course of antibiotics 3 days ago. She contacted the office today complaining of rectal pain and minor bleeding. Tells me that her abdominal discomfort is less and more generalized in the lower quadrants. Still with loose stools, up to 9 today. Lower volume. No fevers. Rectal discomfort with defecation as mentioned.  REVIEW OF SYSTEMS:  All non-GI ROS negative except for fever, headaches, hearing impairment  Past Medical History  Diagnosis Date  . Hearing loss   . Allergic rhinitis   . Abnormal chest x-ray   . Abnormal electrocardiogram   . Venous insufficiency   . Hyperlipidemia   . Overweight   . GERD (gastroesophageal reflux disease)   . Diverticulosis of colon   . Colon polyp   . Fatty liver disease, nonalcoholic   . Cystitis   . Neck pain   . Contact dermatitis due to poison ivy   . Lymphocytosis   . Hemorrhoids     Past Surgical History  Procedure Date  . Total abdominal hysterectomy 12/86  . Inner ear surgery     childhood  . Nasal septum surgery   . Foot surgery     left  . Chemical and laser endovenous ablation of le veins 2008, 2009, 2010, 2011    Dr Donia Ast et al    Social History [name]  reports that she quit smoking about 10  years ago. She has never used smokeless tobacco. She reports that she drinks alcohol. She reports that she does not use illicit drugs.  family history includes Breast cancer in her other; Diabetes in her maternal aunt and maternal uncle; Healthy in her sister; Heart disease in her sister; Heart failure in her brother and son; Hyperlipidemia in her mother; Hypertension in her mother; and Liver disease in her sister.  There is no history of Colon cancer.  No Known Allergies     PHYSICAL EXAMINATION: Vital signs: BP 132/74  Pulse 88  Ht 5\' 4"  (1.626 m)  Wt 196 lb (88.905 kg)  BMI 33.64 kg/m2  Constitutional: generally well-appearing, no acute distress Psychiatric: alert and oriented x3, cooperative Eyes: extraocular movements intact, anicteric, conjunctiva pink Mouth: oral pharynx moist, no lesions Neck: supple no lymphadenopathy Cardiovascular: heart regular rate and rhythm, no murmur Lungs: clear to auscultation bilaterally Abdomen: soft,obese, nontender, nondistended, no obvious ascites, no peritoneal signs, normal bowel sounds, no organomegaly Rectal:mildly inflamed external hemorrhoids. Small posterior fissure. Hemoccult negative stool Extremities: no lower extremity edema bilaterally Skin: no lesions on visible extremities Neuro: No focal deficits.   ASSESSMENT:  #1. Anal discomfort secondary to hemorrhoids and fissure. Minor rectal bleeding due to the same #2. Recent problems with fever, diarrhea, and abdominal discomfort. CT scan suggesting diverticulitis (mild colonic wall thickening inflammation around an area of diverticulosis). Her clinical presentation is atypical for diverticulitis. She  may have colitis, infectious or idiopathic. Seems to be trending toward improvement. Recently finished antibiotics. Suspect current issues with diarrhea antibiotic related. Recent testing for C. Difficile negative. #3. History of adenomatous colon polyps. Colonoscopy 2012 as described #4.  GERD #5. Fatty liver. Discussed with her the importance of fatty liver and potential complications such as inflammation and cirrhosis.   PLAN:  #1. Probiotic Align one twice a day x1 week. Samples given #2. Metamucil one to 2 tablespoons daily 2 improve stool consistency #3. Anusol-HC suppositories twice a day as needed. Prescribed #4. Exercise and weight loss regarding fatty liver. Advised #5. Keep GI appointment August 13 (previously made). If symptoms persist, may need relook colonoscopy. Contact the office in the interim for questions or problems

## 2012-06-24 NOTE — Telephone Encounter (Signed)
Pt had a lot of diarrhea last week, had stool studies and CT scan when she saw Willette Cluster NP. Pt reports that her bottom is very sore and she is having rectal bleeding today. She has a history of hemorrhoids but thinks she may have a tear. Pt scheduled to see Dr. Marina Goodell today at 3pm. Pt aware of appt date and time.

## 2012-06-26 NOTE — Progress Notes (Signed)
Patient saw Dr. Marina Goodell in office yesterday

## 2012-07-15 ENCOUNTER — Encounter: Payer: Self-pay | Admitting: Internal Medicine

## 2012-07-15 ENCOUNTER — Ambulatory Visit (INDEPENDENT_AMBULATORY_CARE_PROVIDER_SITE_OTHER): Payer: Managed Care, Other (non HMO) | Admitting: Internal Medicine

## 2012-07-15 VITALS — BP 114/62 | HR 60 | Ht 63.0 in | Wt 195.4 lb

## 2012-07-15 DIAGNOSIS — K602 Anal fissure, unspecified: Secondary | ICD-10-CM

## 2012-07-15 DIAGNOSIS — K7689 Other specified diseases of liver: Secondary | ICD-10-CM

## 2012-07-15 DIAGNOSIS — R197 Diarrhea, unspecified: Secondary | ICD-10-CM

## 2012-07-15 DIAGNOSIS — R933 Abnormal findings on diagnostic imaging of other parts of digestive tract: Secondary | ICD-10-CM

## 2012-07-15 DIAGNOSIS — Z860100 Personal history of colon polyps, unspecified: Secondary | ICD-10-CM

## 2012-07-15 DIAGNOSIS — R1084 Generalized abdominal pain: Secondary | ICD-10-CM

## 2012-07-15 DIAGNOSIS — K760 Fatty (change of) liver, not elsewhere classified: Secondary | ICD-10-CM

## 2012-07-15 MED ORDER — HYOSCYAMINE SULFATE ER 0.375 MG PO TB12: 0.3750 mg | ORAL_TABLET | Freq: Two times a day (BID) | ORAL | Status: DC

## 2012-07-15 NOTE — Progress Notes (Signed)
HISTORY OF PRESENT ILLNESS:  Morgan [name] is a 61 y.o. female with the below listed medical history who is followed in this office for colon polyps and GERD. Previous colonoscopies 2002, 2005, and most recently April 2011 (last office note apparently incorrectly said December 2012). Last colonoscopy with diminutive colon polyp and mild diverticulosis. She was last seen in the office 06/24/2012 regarding recent problems with diarrhea, abdominal discomfort, and anal discomfort. She was found to have hemorrhoids and fissure. As well, she was felt to have colitis, infectious or idiopathic. She seemed to be trending toward improvement. We treated with probiotic, Metamucil, Anusol, exercise and weight loss (for fatty liver), and this followup. She reports to me that she is feeling better. Frequency of bowel movements is about the same, (about 4 per day) but her bowels are more formed. Urgency is also improved. Still with some abdominal discomfort, but less. No bleeding. No fevers. Her problems with rectal pain have resolved. Overall she states she is about 50% better. Normally she has one to 2 bowel movements per day.  REVIEW OF SYSTEMS:  All non-GI ROS negative entirely  Past Medical History  Diagnosis Date  . Hearing loss   . Allergic rhinitis   . Abnormal chest x-ray   . Abnormal electrocardiogram   . Venous insufficiency   . Hyperlipidemia   . Overweight   . GERD (gastroesophageal reflux disease)   . Diverticulosis of colon   . Colon polyp   . Fatty liver disease, nonalcoholic   . Cystitis   . Neck pain   . Contact dermatitis due to poison ivy   . Lymphocytosis   . Hemorrhoids     Past Surgical History  Procedure Date  . Total abdominal hysterectomy 12/86  . Inner ear surgery     childhood  . Nasal septum surgery   . Foot surgery     left  . Chemical and laser endovenous ablation of le veins 2008, 2009, 2010, 2011    Dr Donia Ast et al    Social History [name]  reports  that she quit smoking about 10 years ago. She has never used smokeless tobacco. She reports that she drinks alcohol. She reports that she does not use illicit drugs.  family history includes Breast cancer in her other; Diabetes in her maternal aunt and maternal uncle; Healthy in her sister; Heart disease in her sister; Heart failure in her brother and son; Hyperlipidemia in her mother; Hypertension in her mother; and Liver disease in her sister.  There is no history of Colon cancer.  No Known Allergies     PHYSICAL EXAMINATION: Vital signs: BP 114/62  Pulse 60  Ht 5\' 3"  (1.6 m)  Wt 195 lb 6.4 oz (88.633 kg)  BMI 34.61 kg/m2 General: Well-developed, well-nourished, no acute distress HEENT: Sclerae are anicteric, conjunctiva pink. Oral mucosa intact Lungs: Clear Heart: Regular Abdomen: soft, nontender, nondistended, no obvious ascites, no peritoneal signs, normal bowel sounds. No organomegaly. Extremities: No edema Psychiatric: alert and oriented x3. Cooperative      ASSESSMENT:  #1. Recent problems with change in bowel habits. Suspect post infectious IBS. Improving. #2. GERD. #3. History of adenomatous colon polyps. Last colonoscopy April 2011 #4. Fatty liver. Previously discussed. #5. Anal discomfort related to hemorrhoids and fissure. Resolved.   PLAN:  #1. Continue fiber and probiotic #2. Prescribed Levbid 0.375 mg by mouth twice a day. Advised with regards to side effects such as drowsiness and dry mouth #3. Office followup in  about 4 weeks. We again discussed the possibility of colonoscopy if symptoms worsen or persist #4. Exercise and weight loss for fatty liver

## 2012-07-15 NOTE — Patient Instructions (Addendum)
No allergy to eggs or soy.  We have sent the following medications to your pharmacy for you to pick up at your convenience:  Lebvid  Please follow up with Dr. Marina Goodell in 4 weeks

## 2012-08-01 ENCOUNTER — Telehealth: Payer: Self-pay | Admitting: Internal Medicine

## 2012-08-01 MED ORDER — RIFAXIMIN 550 MG PO TABS: 550.0000 mg | ORAL_TABLET | Freq: Two times a day (BID) | ORAL | Status: DC

## 2012-08-01 NOTE — Telephone Encounter (Signed)
Patient reports since Tuesday she has had watery urgent diarrhea and vomiting.  She reports twelve episodes yesterday and 5 this am since 8:00.  It did not wake her up during the night.  She has been taking imodium 4 a day and the hyoscyamine as needed.  Denies fever, rectal bleeding, or vomiting. She is currently at [facility].   He is advised to start on hyoscyamine BID and imodium until I I can get an answer from Dr. Marina Goodell.  Dr. Marina Goodell please advise.

## 2012-08-01 NOTE — Telephone Encounter (Signed)
Continue imodium and levbid. Add xifaxan 550 mg bid x 14 days

## 2012-08-01 NOTE — Telephone Encounter (Signed)
Patient advised.

## 2012-08-14 ENCOUNTER — Telehealth: Payer: Self-pay | Admitting: Pulmonary Disease

## 2012-08-14 NOTE — Telephone Encounter (Signed)
Faxed request from the pharmacy for refill for Hycodan cough syrup. Last ov 01/09/12. Due for f/u 01/2013. Pls advise.No Known Allergies

## 2012-08-15 ENCOUNTER — Encounter: Payer: Self-pay | Admitting: Internal Medicine

## 2012-08-15 ENCOUNTER — Ambulatory Visit (INDEPENDENT_AMBULATORY_CARE_PROVIDER_SITE_OTHER): Payer: Managed Care, Other (non HMO) | Admitting: Internal Medicine

## 2012-08-15 VITALS — BP 150/80 | HR 69 | Ht 64.0 in | Wt 197.0 lb

## 2012-08-15 DIAGNOSIS — K7689 Other specified diseases of liver: Secondary | ICD-10-CM

## 2012-08-15 DIAGNOSIS — R197 Diarrhea, unspecified: Secondary | ICD-10-CM

## 2012-08-15 DIAGNOSIS — Z860100 Personal history of colon polyps, unspecified: Secondary | ICD-10-CM

## 2012-08-15 DIAGNOSIS — K760 Fatty (change of) liver, not elsewhere classified: Secondary | ICD-10-CM

## 2012-08-15 DIAGNOSIS — K219 Gastro-esophageal reflux disease without esophagitis: Secondary | ICD-10-CM

## 2012-08-15 MED ORDER — MOVIPREP 100 G PO SOLR: 1.0000 | Freq: Once | ORAL | Status: DC

## 2012-08-15 MED ORDER — HYDROCODONE-HOMATROPINE 5-1.5 MG/5ML PO SYRP: 5.0000 mL | ORAL_SOLUTION | Freq: Four times a day (QID) | ORAL | Status: AC | PRN

## 2012-08-15 NOTE — Telephone Encounter (Signed)
rx has been called to the pharmacy  

## 2012-08-15 NOTE — Progress Notes (Signed)
HISTORY OF PRESENT ILLNESS:  Morgan [name] is a 61 y.o. female with the below listed medical history who is followed in this office for colon polyps and GERD. She has been seen more recently for problems with persistent diarrhea. CT earlier this summer suggesting diverticulitis, which was treated. Stool studies for usual enteric pathogens was negative. She was initially trending towards improvement and treated with probiotics, and fiber. On subsequent followup, incomplete improvement. Ongoing fiber and probiotic recommended in addition to prescribed Levbid for abdominal cramping. A particular encounter was 07/15/2012. She contacted the office about 2 weeks later complaining of worsening diarrhea, despite compliance with medications and antidiarrheals. She was having 6-12 loose stools daily for about 3-4 days. She was prescribed Xifaxan 550 mg twice a day for 2 weeks. She is completing that today. She presents today for scheduled followup. She reports ongoing problems with loose bowels. Approximately 3-5 per day. Occasionally at night. No bleeding, fever, or weight loss. Levbid is helping the cramping discomfort. Her last colonoscopy April 2011.  REVIEW OF SYSTEMS:  All non-GI ROS negative except for cough, sinus and allergy trouble, sore throat,  Past Medical History  Diagnosis Date  . Hearing loss   . Allergic rhinitis   . Abnormal chest x-ray   . Abnormal electrocardiogram   . Venous insufficiency   . Hyperlipidemia   . Overweight   . GERD (gastroesophageal reflux disease)   . Diverticulosis of colon   . Colon polyp   . Fatty liver disease, nonalcoholic   . Cystitis   . Neck pain   . Contact dermatitis due to poison ivy   . Lymphocytosis   . Hemorrhoids     Past Surgical History  Procedure Date  . Total abdominal hysterectomy 12/86  . Inner ear surgery     childhood  . Nasal septum surgery   . Foot surgery     left  . Chemical and laser endovenous ablation of le veins 2008,  2009, 2010, 2011    Dr Donia Ast et al    Social History Morgan [name]  reports that she quit smoking about 10 years ago. She has never used smokeless tobacco. She reports that she drinks alcohol. She reports that she does not use illicit drugs.  family history includes Breast cancer in her other; Diabetes in her maternal aunt and maternal uncle; Healthy in her sister; Heart disease in her sister; Heart failure in her brother and son; Hyperlipidemia in her mother; Hypertension in her mother; and Liver disease in her sister.  There is no history of Colon cancer.  No Known Allergies     PHYSICAL EXAMINATION: Vital signs: BP 150/80  Pulse 69  Ht 5\' 4"  (1.626 m)  Wt 197 lb (89.359 kg)  BMI 33.82 kg/m2 General: Well-developed, well-nourished, no acute distress HEENT: Sclerae are anicteric, conjunctiva pink. Oral mucosa intact Lungs: Clear Heart: Regular Abdomen: soft, nontender, nondistended, no obvious ascites, no peritoneal signs, normal bowel sounds. No organomegaly. Extremities: No edema Psychiatric: alert and oriented x3. Cooperative      ASSESSMENT:  #1. Diarrhea. Ongoing with intermittent exacerbation. Negative workup to date. Failure to respond to empiric therapies. Nothing to suggest inflammatory condition. I am concerned that she may have microscopic colitis. #2. History of adenomatous colon polyps. Prior examinations 2002, 2005, 2011 #3. GERD. Currently asymptomatic. Not on medical therapy. Prior endoscopy 2011 normal #4. Fatty liver. Ongoing. Previously addressed   PLAN:  #1. Schedule colonoscopy with biopsies.The nature of the procedure, as well as  the risks, benefits, and alternatives were carefully and thoroughly reviewed with the patient. Ample time for discussion and questions allowed. The patient understood, was satisfied, and agreed to proceed. Movi prep prescribed. The patient instructed on its use.

## 2012-08-15 NOTE — Patient Instructions (Addendum)
You have been scheduled for a colonoscopy with propofol. Please follow written instructions given to you at your visit today.  Please pick up your prep kit at the pharmacy within the next 1-3 days. If you use inhalers (even only as needed), please bring them with you on the day of your procedure.  

## 2012-09-01 ENCOUNTER — Ambulatory Visit (AMBULATORY_SURGERY_CENTER): Payer: Managed Care, Other (non HMO) | Admitting: Internal Medicine

## 2012-09-01 ENCOUNTER — Encounter: Payer: Self-pay | Admitting: Internal Medicine

## 2012-09-01 VITALS — BP 147/102 | HR 78 | Temp 96.5°F | Resp 18 | Ht 64.0 in | Wt 197.0 lb

## 2012-09-01 DIAGNOSIS — D126 Benign neoplasm of colon, unspecified: Secondary | ICD-10-CM

## 2012-09-01 DIAGNOSIS — R197 Diarrhea, unspecified: Secondary | ICD-10-CM

## 2012-09-01 DIAGNOSIS — K5730 Diverticulosis of large intestine without perforation or abscess without bleeding: Secondary | ICD-10-CM

## 2012-09-01 DIAGNOSIS — Z86010 Personal history of colonic polyps: Secondary | ICD-10-CM

## 2012-09-01 MED ORDER — SODIUM CHLORIDE 0.9 % IV SOLN: 500.0000 mL | INTRAVENOUS | Status: DC

## 2012-09-01 NOTE — Patient Instructions (Addendum)
YOU HAD AN ENDOSCOPIC PROCEDURE TODAY AT THE [facility]: Refer to the procedure report that was given to you for any specific questions about what was found during the examination.  If the procedure report does not answer your questions, please call your gastroenterologist to clarify.  If you requested that your care partner not be given the details of your procedure findings, then the procedure report has been included in a sealed envelope for you to review at your convenience later.  YOU SHOULD EXPECT: Some feelings of bloating in the abdomen. Passage of more gas than usual.  Walking can help get rid of the air that was put into your GI tract during the procedure and reduce the bloating. If you had a lower endoscopy (such as a colonoscopy or flexible sigmoidoscopy) you may notice spotting of blood in your stool or on the toilet paper. If you underwent a bowel prep for your procedure, then you may not have a normal bowel movement for a few days.  DIET: Your first meal following the procedure should be a light meal and then it is ok to progress to your normal diet.  A half-sandwich or bowl of soup is an example of a good first meal.  Heavy or fried foods are harder to digest and may make you feel nauseous or bloated.  Likewise meals heavy in dairy and vegetables can cause extra gas to form and this can also increase the bloating.  Drink plenty of fluids but you should avoid alcoholic beverages for 24 hours.  ACTIVITY: Your care partner should take you home directly after the procedure.  You should plan to take it easy, moving slowly for the rest of the day.  You can resume normal activity the day after the procedure however you should NOT DRIVE or use heavy machinery for 24 hours (because of the sedation medicines used during the test).    SYMPTOMS TO REPORT IMMEDIATELY: A gastroenterologist can be reached at any hour.  During normal business hours, 8:30 AM to 5:00 PM Monday through Friday,  call [phone].  After hours and on weekends, please call the GI answering service at [phone] who will take a message and have the physician on call contact you.   Following lower endoscopy (colonoscopy or flexible sigmoidoscopy):  Excessive amounts of blood in the stool  Significant tenderness or worsening of abdominal pains  Swelling of the abdomen that is new, acute  Fever of 100F or higher  Following upper endoscopy (EGD)  Vomiting of blood or coffee ground material  New chest pain or pain under the shoulder blades  Painful or persistently difficult swallowing  New shortness of breath  Fever of 100F or higher  Black, tarry-looking stools  FOLLOW UP: If any biopsies were taken you will be contacted by phone or by letter within the next 1-3 weeks.  Call your gastroenterologist if you have not heard about the biopsies in 3 weeks.  Our staff will call the home number listed on your records the next business day following your procedure to check on you and address any questions or concerns that you may have at that time regarding the information given to you following your procedure. This is a courtesy call and so if there is no answer at the home number and we have not heard from you through the emergency physician on call, we will assume that you have returned to your regular daily activities without incident.  SIGNATURES/CONFIDENTIALITY: You and/or your care   partner have signed paperwork which will be entered into your electronic medical record.  These signatures attest to the fact that that the information above on your After Visit Summary has been reviewed and is understood.  Full responsibility of the confidentiality of this discharge information lies with you and/or your care-partner.  

## 2012-09-01 NOTE — Progress Notes (Signed)
Propofol per B Walton CRNA, see scanned intra procedure report.  All meds titrated per CRNA during procedure. ewm 

## 2012-09-01 NOTE — Op Note (Signed)
[facility] [address] [place], [address]   COLONOSCOPY PROCEDURE REPORT  PATIENT: Morgan [name], Morgan [name]  MR#: [id number] BIRTHDATE: Oct 19, 1951 , 61  yrs. old GENDER: Female ENDOSCOPIST: Roxy Cedar, MD REFERRED BY:.  Self / Office PROCEDURE DATE:  09/01/2012 PROCEDURE:   Colonoscopy with biopsies ASA CLASS:   Class II INDICATIONS:chronic diarrhea, unexplained diarrhea, and patient's personal history of adenomatous colon polyps (prior exams 2002,05,11). MEDICATIONS: MAC sedation, administered by CRNA and propofol (Diprivan) 230mg  IV  DESCRIPTION OF PROCEDURE:   After the risks benefits and alternatives of the procedure were thoroughly explained, informed consent was obtained.  A digital rectal exam revealed no abnormalities of the rectum.   The LB CF-[id number]  endoscope was introduced through the anus and advanced to the cecum, which was identified by both the appendix and ileocecal valve. No adverse events experienced.   The quality of the prep was excellent, using MoviPrep  The instrument was then slowly withdrawn as the colon was fully examined.      COLON FINDINGS: The mucosa appeared normal in the terminal ileum. Mild diverticulosis was noted in the sigmoid colon.   The colon was otherwise normal.  There was no inflammation, polyps or cancers. Random colon bx taken. Retroflexed views revealed internal hemorrhoids. The time to cecum=3 minutes 0 seconds.  Withdrawal time=8 minutes 46 seconds.  The scope was withdrawn and the procedure completed. COMPLICATIONS: There were no complications.  ENDOSCOPIC IMPRESSION: 1.   Normal mucosa in the terminal ileum 2.   Mild diverticulosis was noted in the sigmoid colon 3.   The colon was otherwise normal s/p random biopsies  RECOMMENDATIONS: 1.  await biopsy results 2.  Follow up colonoscopy in 5 years (hx adenomas)   eSigned:  Roxy Cedar, MD 09/01/2012 4:27 PM  cc: Michele Mcalpine, MD and  The Patient   PATIENT NAME:  Morgan [name], Morgan [name] MR#: [id number]

## 2012-09-01 NOTE — Progress Notes (Signed)
Patient did not experience any of the following events: a burn prior to discharge; a fall within the facility; wrong site/side/patient/procedure/implant event; or a hospital transfer or hospital admission upon discharge from the facility. (G8907) Patient did not have preoperative order for IV antibiotic SSI prophylaxis. (G8918)  

## 2012-09-02 ENCOUNTER — Telehealth: Payer: Self-pay | Admitting: *Deleted

## 2012-09-02 NOTE — Telephone Encounter (Signed)
  Follow up Call-  Call back number 09/01/2012  Post procedure Call Back phone  # [phone]  Permission to leave phone message Yes     Patient questions:  Do you have a fever, pain , or abdominal swelling? no Pain Score  0 *  Have you tolerated food without any problems? yes  Have you been able to return to your normal activities? yes  Do you have any questions about your discharge instructions: Diet   no Medications  no Follow up visit  no  Do you have questions or concerns about your Care? no  Actions: * If pain score is 4 or above: No action needed, pain <4.

## 2012-09-05 ENCOUNTER — Encounter: Payer: Self-pay | Admitting: Internal Medicine

## 2012-09-05 ENCOUNTER — Telehealth: Payer: Self-pay

## 2012-09-05 MED ORDER — DIPHENOXYLATE-ATROPINE 2.5-0.025 MG PO TABS: ORAL_TABLET | ORAL | Status: DC

## 2012-09-05 NOTE — Telephone Encounter (Signed)
Message copied by Annett Fabian on Fri Sep 05, 2012  2:57 PM ------      Message from: Hilarie Fredrickson      Created: Fri Sep 05, 2012 10:23 AM      Regarding: results, meds, follow up       Please call patient and let her know that the colon bx were normal. Please prescribe lomotil 1-2 po q 6 hrs prn; # 60; 1 refill. Also, she needs routine office follow up in 4-6 weeks. Thanks

## 2012-09-05 NOTE — Telephone Encounter (Signed)
Patient advised. RX sent.  She is scheduled for follow up 10/08/12

## 2012-10-08 ENCOUNTER — Other Ambulatory Visit (INDEPENDENT_AMBULATORY_CARE_PROVIDER_SITE_OTHER): Payer: Managed Care, Other (non HMO)

## 2012-10-08 ENCOUNTER — Encounter: Payer: Self-pay | Admitting: Internal Medicine

## 2012-10-08 ENCOUNTER — Ambulatory Visit (INDEPENDENT_AMBULATORY_CARE_PROVIDER_SITE_OTHER): Payer: Managed Care, Other (non HMO) | Admitting: Internal Medicine

## 2012-10-08 VITALS — BP 140/74 | HR 72 | Ht 65.0 in | Wt 199.2 lb

## 2012-10-08 DIAGNOSIS — Z86010 Personal history of colonic polyps: Secondary | ICD-10-CM

## 2012-10-08 DIAGNOSIS — K589 Irritable bowel syndrome without diarrhea: Secondary | ICD-10-CM

## 2012-10-08 DIAGNOSIS — K219 Gastro-esophageal reflux disease without esophagitis: Secondary | ICD-10-CM

## 2012-10-08 DIAGNOSIS — R197 Diarrhea, unspecified: Secondary | ICD-10-CM

## 2012-10-08 NOTE — Patient Instructions (Addendum)
Your physician has requested that you go to the basement for  lab work before leaving today.   

## 2012-10-08 NOTE — Progress Notes (Signed)
HISTORY OF PRESENT ILLNESS:  Morgan [name] is a 61 y.o. female with the below listed medical history who has been followed in this office for a history of adenomatous colon polyps, GERD, and most recently persistent diarrhea. She was last seen in the office 08/15/2012 for followup. Improve, but ongoing symptoms. Failure to respond to empiric therapies such as metronidazole, Xifaxan, and probiotics. For abdominal pain she was prescribed Levbid. This helped her discomfort. Because of concerns of possible microscopic colitis, she underwent complete colonoscopy with mucosal biopsies 09/01/2012. Examination revealed normal colonic and ileal mucosa. Mild sigmoid diverticulosis only. Random colon biopsies were normal. Since that time, she is taken no symptomatic medications such as Levbid or Lomotil. She presents now for followup. Since that time she continues to report about 3-6 bowel movements per day. Generally in the morning. No incontinence. Most stools have form. No steatorrhea. No weight loss (gained 2 pounds). No systemic symptoms. No significant abdominal pain other than occasional cramps generally related to bowel movements REVIEW OF SYSTEMS:  All non-GI ROS negative except for  Sinus and allergy trouble, headaches, decreased hearing  Past Medical History  Diagnosis Date  . Hearing loss   . Allergic rhinitis   . Abnormal chest x-ray   . Abnormal electrocardiogram   . Venous insufficiency   . Hyperlipidemia   . Overweight   . GERD (gastroesophageal reflux disease)   . Diverticulosis of colon   . Colon polyp     polypoid colorectal mucosa/adenomatous  . Fatty liver disease, nonalcoholic   . Cystitis   . Neck pain   . Contact dermatitis due to poison ivy   . Lymphocytosis   . Hemorrhoids     Past Surgical History  Procedure Date  . Total abdominal hysterectomy 12/86  . Inner ear surgery     childhood; right  . Nasal septum surgery   . Foot surgery     left  . Chemical and laser  endovenous ablation of le veins 2008, 2009, 2010, 2011    Dr Donia Ast et al    Social History [name]  reports that she quit smoking about 10 years ago. She has never used smokeless tobacco. She reports that she drinks alcohol. She reports that she does not use illicit drugs.  family history includes Breast cancer in her other; Diabetes in her maternal aunt and maternal uncle; Healthy in her sister; Heart disease in her sister; Heart failure in her brother and son; Hyperlipidemia in her mother; Hypertension in her mother; and Liver disease in her sister.  There is no history of Colon cancer.  No Known Allergies     PHYSICAL EXAMINATION: Vital signs: BP 140/74  Pulse 72  Ht 5\' 5"  (1.651 m)  Wt 199 lb 3.2 oz (90.357 kg)  BMI 33.15 kg/m2 General: Well-developed, well-nourished, no acute distress Abdomen:not reexamined. Psychiatric: alert and oriented x3. Cooperative    ASSESSMENT:  #1. Ongoing problems with more frequent somewhat loose bowels as described. Fairly extensive negative workup and failure to respond to empiric therapies as described. 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 #2. GERD. Asymptomatic off PPI #3. Issue of adenomatous colon polyps. No neoplasia on recent exam   PLAN:  #1. Serologic screening for celiac sprue #2. Discussion on postinfectious IBS #3. Encouraged to use Levbid as needed for pain and Lomotil as needed for significant diarrhea. #4. Surveillance colonoscopy 2018 #5. Routine GI followup in 3 months. Contact the office in the interim for any questions or problems

## 2012-11-24 ENCOUNTER — Ambulatory Visit (INDEPENDENT_AMBULATORY_CARE_PROVIDER_SITE_OTHER): Payer: Managed Care, Other (non HMO) | Admitting: Pulmonary Disease

## 2012-11-24 ENCOUNTER — Encounter: Payer: Self-pay | Admitting: Pulmonary Disease

## 2012-11-24 VITALS — BP 120/70 | HR 73 | Temp 99.7°F | Ht 64.0 in | Wt 196.6 lb

## 2012-11-24 DIAGNOSIS — K5730 Diverticulosis of large intestine without perforation or abscess without bleeding: Secondary | ICD-10-CM

## 2012-11-24 DIAGNOSIS — I10 Essential (primary) hypertension: Secondary | ICD-10-CM

## 2012-11-24 DIAGNOSIS — K7689 Other specified diseases of liver: Secondary | ICD-10-CM

## 2012-11-24 DIAGNOSIS — K219 Gastro-esophageal reflux disease without esophagitis: Secondary | ICD-10-CM

## 2012-11-24 DIAGNOSIS — E663 Overweight: Secondary | ICD-10-CM

## 2012-11-24 DIAGNOSIS — J45901 Unspecified asthma with (acute) exacerbation: Secondary | ICD-10-CM

## 2012-11-24 DIAGNOSIS — D126 Benign neoplasm of colon, unspecified: Secondary | ICD-10-CM

## 2012-11-24 DIAGNOSIS — E785 Hyperlipidemia, unspecified: Secondary | ICD-10-CM

## 2012-11-24 DIAGNOSIS — I872 Venous insufficiency (chronic) (peripheral): Secondary | ICD-10-CM

## 2012-11-24 DIAGNOSIS — J45909 Unspecified asthma, uncomplicated: Secondary | ICD-10-CM

## 2012-11-24 MED ORDER — HYDROCOD POLST-CHLORPHEN POLST 10-8 MG/5ML PO LQCR: 5.0000 mL | Freq: Two times a day (BID) | ORAL | Status: DC

## 2012-11-24 MED ORDER — AMOXICILLIN-POT CLAVULANATE 875-125 MG PO TABS: 1.0000 | ORAL_TABLET | Freq: Two times a day (BID) | ORAL | Status: DC

## 2012-11-24 MED ORDER — METHYLPREDNISOLONE ACETATE 80 MG/ML IJ SUSP
80.0000 mg | Freq: Once | INTRAMUSCULAR | Status: AC
  Administered 2012-11-24: 80 mg via INTRAMUSCULAR

## 2012-11-24 MED ORDER — METHYLPREDNISOLONE 4 MG PO KIT: PACK | ORAL | Status: DC

## 2012-11-24 NOTE — Progress Notes (Signed)
Subjective:     Patient ID: Morgan [name], female   DOB: 03-12-1951, 61 y.o.   MRN: [id number]  HPI 61 y/o WF here for a follow up visit & CPX... she has multiple medical problems as noted below...    ~  November 22, 2009:  she reports another good year w/o new problems or concerns... her BP is controlled on the MicardisHct & she denies CP, palpit, etc...  she has had laser vein surg by DrKrusch on her right leg & will be getting the same on her left leg soon...  she does note occas dysphagia/ choking & we discussed GI eval from DrPerry (she has 26yr colon due too)...  ~  November 24, 2010:  she had GI eval from DrPerry 4/11 w/ EGD (normal), and Colon (mild sigm divertics & diminutive polyp= polypoid mucosa)... she continues to f/u w/ Vein specialists & DrFeatherston has done more surg & she is delighted w/ the results... her weight is down 7# to 182# today & she has been walking 2mi 5d/wk... only new complaint> right carpel tunnel symptoms & she states it's not bad enough for NCV testing, therefore rec wrist splint trial... recent labs reviewed & Lipids look great on Rx;  sl incr LFTs from fatty liver dis & she is reminded to avoid Etoh...  ~  January 09, 2012:  Yearly ROV & CPX> she reports ILI 12/12 treated w/ Tamiflu +OTC meds; then refractory sore throat & laryngitis treated by DrBates (we don't have notes) and f/u due soon, on Prevacid Bid & resting her voice- improved...    <HBP> on MicardisHCT 40/12.5 & BP= 128/74; she denies CP, palpit, dizzy, SOB, edema, etc...    <VenInsuffic> she remains under the care of DrFeatherston & the [facility]...    <Hyperlipid> on Simva40 + Feno160; FLP looks good w/ all parameters wnl...    <Overweight> wt is up 18# to 200# & we reviewed diet, exercise, etc...    <GI- GERD, Divertics, Polyps, FLD> on Prev30Bid & must lose weight; LFTs remain sl elev w/ her wt gain...    <Lymphocytosis> CBCs & smear reviewed; no change in her counts or blood smear...  ~   November 24, 2012:  70mo ROV & Morgan [name] presents for an add-on appt w/ sinus infection x1wk w/ congestion, green drainage, hoarseness, T100 +chills & aching; she had nosebleed last wk; we decided to treat w/ Depo, Medrol dosepak, Augmentin, Tussionex, & Align... She has had mult GI visits w/ DrPerry Antony Haste for "post infectious IBS" where she had 1-12 stools per day, now improved to just 1-2 daily on Align probiotic... BP controlled on the MicardisHCT, weight is stable on her diet & exercise but she needs to do better & get the wt down!  She has yearly CPX sched for 1/14....    We reviewed prob list, meds, xrays and labs> see below for updates >>          Problem List:     HEARING LOSS (ICD-389.9) - hx mild hi freq hearing loss- eval by DrPahel in 2004...  ALLERGIC RHINITIS (ICD-477.9) - on FEXOFENADINE 180mg /d... mild allergy symptoms... ~  12/13: presents w/ sinus infection 7 treated w/ Depo, Dosepak, Augmentin, Mucinex, Fluids, Tussionex...  ? of CHEST XRAY, ABNORMAL (ICD-793.1) -  ~  CXR 11/09 showed clear lungs, mild biapical pleural thickening, calcif Ao ~  routine CXR 12/10 showed ?sm nodular opac RUL, & CTChest did not reveal a nodule- just biapical pleuroparenchymal scarring, & fatty liver... ~  CXR 12/11 showed ?sm granuloma RUL, no active lung dis... ~  CXR 2/13 showed normal heart size, clear lungs, NAD...  HYPERTENSION (ICD-401.9) - on MICARDIS/ HCT 40-12.5 daily... BP= 120/70 today and doing well- taking med regularly & tolerated well... denies HA, fatigue, visual changes, CP, palipit, dizziness, syncope, dyspnea, edema, etc...  ~  2DEcho 10/01 was WNL.Marland Kitchen. norm wall thickness, norm valves, norm EF... ~  NuclearStressTest 10/01 showed breast attenuation, normal wall motion, EF= 65%...  ABNORMAL ELECTROCARDIOGRAM (ICD-794.31) - hx intermittent RBBB/ IVCD in the past and few PVC's noted (Holter 1997)... she is asymptomatic w/o CP, palpit. dizzy. syncope, etc... ~  EKG 12/10 w/ RBBB,  otherw neg...  VENOUS INSUFFICIENCY (ICD-459.81) - on low sodium diet, elevation, compression hose, etc... she has been followed since 2007 by DrKrusch et al at the [facility] w/ surgery including endovenous chemical ablation & sclerotherapy in 2008, 2009, 2010, & 2011> she is very happy w/ her results & note from [facility] 11/11 is reviewed.  HYPERLIPIDEMIA (ICD-272.4) - on SIMVASTATIN 40mg /d & FENOFIBRATE 160mg /d... ~  FLP 6/08 showed TChol 213, TG 408, HDL 56, LDL 101... on Vytorin10/20, added Tricor145. ~  FLP 11/09 on Vytor10/20+Tric145 showed TChol 146, TG 75, HDL 52, LDL 79... changed to Simva40 + [id number] for $$ reasons... ~  FLP 12/10 on Simva40+Feno160 showed TChol 162, TG 85, HDL 54, LDL 91 ~  FLP 12/11 on Simva40+Feno160 showed TChol 149, TG 60, HDL 59, LDL 79 ~  FLP 2/13 on Simva40+Feno160 showed TChol 150, TG 85, HDL 54, LDL 79  OVERWEIGHT (ICD-278.02) - we discussed diet + exercise therapy... ~  peak weight = 220# in 2007 ~  weight 11/09 = 199# ~  weight 12/10 = 189#... Good job, keep it up! ~  weight 12/11 = 182# ~  Weight 2/13 = 200#... We reviewed need for diet/ exercise... ~  Weight 12/13 = 197#  GERD (ICD-530.81) - currently on PREVACID 30mg  Bid per DrBates for LER... ~  EGD in 1976 by DrSam showed mild gastritis... ~  12/10: she notes intermittent choking, dysphagia> EGD 4/11 by DrPerry was normal/ neg. ~  12/12: eval by DrBates for throat symptoms believed due to LER & placed on Prev30Bid...  DIVERTICULOSIS OF COLON (ICD-562.10), & COLONIC POLYPS (ICD-211.3) ~  colonoscopy 12/05 by DrPerry showed divertics and 2-65mm polyps= adenomatous and hyperplastic... ~  8/09: bout of diverticulitis treated by DrPerry... ~  colonoscopy 4/11 by DrPerry showed mild sigm divertics, diminutive polyp= polypoid mucosa only... ~  2013:  She had thorough eval by DrPerry for diarrhea up to 10-12/d & dx w/ "post infectious IBS" she says; now improved on ALIGN daily...  FATTY LIVER  DISEASE (ICD-571.8) - she has mild elev LFTs & instructed on low fat diet, wt reduction strategy. ~  LFTs 11/09 (wt=191#) showed AlkPhos=43, SGOT=28, SGPT=31 ~  LFTs 12/10 (wt=189#) showed AlkPhos=47, SGOT=36, SGPT=38 ~  CT Chest 1/11 showed fatty liver as an incidental finding. ~  LFTs 12/11 (wt=182#) showed AlkPhos=44, SGOT=52, SGPT=44 ~  LFTs 2/13 (wt=200#) showed AlkPhos= 53, SGOT=48, SGPT=55... Must get weight down!  Hx of CYSTITIS (ICD-595.9) - Urology eval 11/08 by [id number] for chr cystitis...  Hx of NECK PAIN (ICD-723.1) - eval 11/09 w/ XRays showing some spondylosis & Rx w/ Tramadol, Rest, Heat, Tylenol...  CONTACT DERMATITIS DUE TO POISON IVY (ICD-692.6)  LYMPHOCYTOSIS (ICD-288.8) - CBC's show a mononuclear lymphocytosis of ? signif... ~  CBC 2001 w/ WBC= 7.4 & 15% lymphs, 8% monos... ~  CBC 10/07  showed WBC= 7.6 w/ 23% lymphs, 7% monos... ~  CBC 2/08 showed WBC= 7.8 w/ 23% lymphs, 7% monos... ~  CBC 5/09 showed WBC= 8.7 w/ 20% lymphs, 19% monos... ~  CBC 11/09 showed WBC= 5.5 w/ 34% lymphs, 24% monos... repeat= 21% monos and pathologist review (DrHessling)= "normal RBC, WBC, plat morphology... normal peripheral blood smear"... ~  CBC 12/10 showed WBC= 5.5, Lymphs= 35%, Monos= 25% ~  CBC 12/11 showed WBC= 6.3, Lymph=29%, Mono=29% ~  CBC 2/13 showed WBC= 8.2, Lymph=32%, Mono=27%... Smear reviewed by path> no abn cells...  Health Maintenance:   she is under some stress related to her husb smoking. ~  GI:  DrPerry & up to date on colon screening... ~  GYN= DrMcPhail on Estrdiol 0.5, +calcium, MVI... she gets PAP, Mammograms, etc... ~  Immunizations:  she gets the yearly seasonal Flu vaccine... Pneumovax due age 63... ?last Tetanus shot.   Past Surgical History  Procedure Date  . Total abdominal hysterectomy 12/86  . Inner ear surgery     childhood; right  . Nasal septum surgery   . Foot surgery     left  . Chemical and laser endovenous ablation of le veins 2008, 2009,  2010, 2011    Dr Verl Blalock al    Outpatient Encounter Prescriptions as of 11/24/2012  Medication Sig Dispense Refill  . aspirin 81 MG tablet Take 81 mg by mouth daily. Takes 4 times weekly due to bruising      . bifidobacterium infantis (ALIGN) capsule Take 1 capsule by mouth daily.  14 capsule  0  . calcium carbonate (OS-CAL) 600 MG TABS Take 2 tablets by mouth daily.        . fenofibrate 160 MG tablet Take 1 tablet (160 mg total) by mouth daily.  90 tablet  3  . fexofenadine (ALLEGRA) 180 MG tablet Take 180 mg by mouth daily.      . Multiple Vitamin (MULTIVITAMIN) tablet Take 1 tablet by mouth daily.        . simvastatin (ZOCOR) 40 MG tablet Take 1 tablet (40 mg total) by mouth at bedtime.  90 tablet  3  . telmisartan-hydrochlorothiazide (MICARDIS HCT) 40-12.5 MG per tablet Take 1 tablet by mouth daily.  90 tablet  3  . Probiotic Product (PROBIOTIC PO) Take by mouth daily.        No Known Allergies   Current Medications, Allergies, Past Medical History, Past Surgical History, Family History, and Social History were reviewed in [employer] record.   Review of Systems         The patient complains of nocturia, arthritis, and anxiety.  The patient denies fever, chills, sweats, anorexia, fatigue, weakness, malaise, weight loss, sleep disorder, blurring, diplopia, eye irritation, eye discharge, vision loss, eye pain, photophobia, earache, ear discharge, tinnitus, decreased hearing, nasal congestion, nosebleeds, sore throat, hoarseness, chest pain, palpitations, syncope, dyspnea on exertion, orthopnea, PND, peripheral edema, cough, dyspnea at rest, excessive sputum, hemoptysis, wheezing, pleurisy, nausea, vomiting, diarrhea, constipation, change in bowel habits, abdominal pain, melena, hematochezia, jaundice, gas/bloating, indigestion/heartburn, dysphagia, odynophagia, dysuria, hematuria, urinary frequency, urinary hesitancy, incontinence, back pain, joint pain, joint  swelling, muscle cramps, muscle weakness, stiffness, sciatica, restless legs, leg pain at night, leg pain with exertion, rash, itching, dryness, suspicious lesions, paralysis, paresthesias, seizures, tremors, vertigo, transient blindness, frequent falls, frequent headaches, difficulty walking, depression, memory loss, confusion, cold intolerance, heat intolerance, polydipsia, polyphagia, polyuria, unusual weight change, abnormal bruising, bleeding, enlarged lymph nodes, urticaria, allergic rash, hay  fever, and recurrent infections.     Objective:   Physical Exam     WD, Overweight, 61 y/o WF in NAD... GENERAL:  Alert & oriented; pleasant & cooperative... HEENT:  [place]/AT, EOM-wnl, PERRLA, EACs-clear, TMs-wnl, NOSE-clear, THROAT-clear & wnl. NECK:  Supple w/ fairROM; no JVD; normal carotid impulses w/o bruits; no thyromegaly or nodules palpated; no lymphadenopathy. CHEST:  Clear to P & A; without wheezes/ rales/ or rhonchi. HEART:  Regular Rhythm; without murmurs/ rubs/ or gallops. ABDOMEN:  Soft & nontender, +panniculus; normal bowel sounds; no organomegaly or masses detected. EXT: without deformities, mild arthritic changes; varicose veins/ +venous insuffic/ tr edema. NEURO:  CN's intact; motor testing normal; sensory testing normal; gait normal & balance OK. DERM:  No lesions noted; no rash etc...  RADIOLOGY DATA:  Reviewed in the EPIC EMR & discussed w/ the patient...    >>CXR 2/13 norm heart size, clear lungs, NAD...  LABORATORY DATA:  Reviewed in the EPIC EMR & discussed w/ the patient...    >>LABS: FLP- ok on Simva40+Feno160;  CBC w/ incr monos- stable no ch from last yr;  Chems/ TSH/ VitD- all ok x borderline LFTs.   Assessment:     Acute Sinusitis>> we discussed treatment w/ Depo, Dosepak, Augmentin, Mucinex/ Fluids, Tussionex...  AR & Laryngitis>  Eval & Rx by DrBates; on MMW, PPI Bid, etc; she has maintained f/u w/ ENT...  Hx Abn CXR>  Small RUL granuloma & bilat apic pleural  thickening; no acute changes...  HBP>  Controlled on the MicardisHCT; advised low sodium & get wt down!!!  Ven Insuffic>  Followed by drFeatherston at [facility]...  Hyperlipid>  On Simva40 + Feno160 & FLP looks great; needs better diet & wt reduction...  Overweight>  Wt up to 200# & she knows the drill: count calories, eat less, exercise, burn more...  GI> GERD, Divertics, Polyps, FLD>  Continue PPI Rx and needs to lose wt to improve the LFTs; "post infectious IBS" improved w/ Align...  LYMPHOCYTOSIS>  CBC is stable w/ mononuclear lymphocytosis, no change, smear reviewed by pathologist- ok no abn cells...     Plan:     Patient's Medications  New Prescriptions   AMOXICILLIN-CLAVULANATE (AUGMENTIN) 875-125 MG PER TABLET    Take 1 tablet by mouth 2 (two) times daily.   CHLORPHENIRAMINE-HYDROCODONE (TUSSIONEX PENNKINETIC ER) 10-8 MG/5ML LQCR    Take 5 mLs by mouth every 12 (twelve) hours.   METHYLPREDNISOLONE (MEDROL, PAK,) 4 MG TABLET    follow package directions  Previous Medications   ASPIRIN 81 MG TABLET    Take 81 mg by mouth daily. Takes 4 times weekly due to bruising   BIFIDOBACTERIUM INFANTIS (ALIGN) CAPSULE    Take 1 capsule by mouth daily.   CALCIUM CARBONATE (OS-CAL) 600 MG TABS    Take 2 tablets by mouth daily.     FENOFIBRATE 160 MG TABLET    Take 1 tablet (160 mg total) by mouth daily.   FEXOFENADINE (ALLEGRA) 180 MG TABLET    Take 180 mg by mouth daily.   MULTIPLE VITAMIN (MULTIVITAMIN) TABLET    Take 1 tablet by mouth daily.     PROBIOTIC PRODUCT (PROBIOTIC PO)    Take by mouth daily.   SIMVASTATIN (ZOCOR) 40 MG TABLET    Take 1 tablet (40 mg total) by mouth at bedtime.   TELMISARTAN-HYDROCHLOROTHIAZIDE (MICARDIS HCT) 40-12.5 MG PER TABLET    Take 1 tablet by mouth daily.  Modified Medications   No medications on file  Discontinued Medications   No medications on file

## 2012-11-24 NOTE — Patient Instructions (Addendum)
Today we updated your med list in our EPIC system...    Continue your current medications the same...  For your upper resp track infection>    We gave you a depo shot 7 wrote for a Medrol dosepak- take as directed...    We wrote for Augmentin 875mg - one tab twice daily...     And some TUSSIONEX- one tsp every 12H as needed for cough...  Remember to continue the [facility] 1-2 tabs twice daily w/ lots of fluids...  Call for any questions.Marland KitchenMarland Kitchen

## 2012-12-04 ENCOUNTER — Telehealth: Payer: Self-pay | Admitting: Pulmonary Disease

## 2012-12-04 DIAGNOSIS — Z0000 Encounter for general adult medical examination without abnormal findings: Secondary | ICD-10-CM

## 2012-12-04 NOTE — Telephone Encounter (Signed)
Pt last seen 12.23.13 for acute visit. Last CPX was 2.6.13. Upcoming CPX 1.8.14.  Insurance will not pay for this.  Called spoke with patient and informed her that insurance will not pay for her CPX so early.  CPX rescheduled to 2.7.14 @ 1130.  Labs ordered for patient earlier that week - pt is aware and will call if needed for sooner follow up.  UA, vit d, cbcd, lipid, bmet, hepatic, tsh.

## 2012-12-04 NOTE — Telephone Encounter (Signed)
Leigh please advise on labs for upcoming cpx with SN 12/11/11 Last ov 11/24/12 Thanks!

## 2012-12-10 ENCOUNTER — Ambulatory Visit: Payer: Managed Care, Other (non HMO) | Admitting: Pulmonary Disease

## 2013-01-09 ENCOUNTER — Ambulatory Visit: Payer: Managed Care, Other (non HMO) | Admitting: Pulmonary Disease

## 2013-01-13 ENCOUNTER — Other Ambulatory Visit (INDEPENDENT_AMBULATORY_CARE_PROVIDER_SITE_OTHER): Payer: Managed Care, Other (non HMO)

## 2013-01-13 DIAGNOSIS — Z0000 Encounter for general adult medical examination without abnormal findings: Secondary | ICD-10-CM

## 2013-01-13 LAB — URINALYSIS, ROUTINE W REFLEX MICROSCOPIC
Bilirubin Urine: NEGATIVE
Ketones, ur: NEGATIVE
Leukocytes, UA: NEGATIVE
Nitrite: NEGATIVE
Specific Gravity, Urine: <=1.005 (ref 1.000–1.030)
Urobilinogen, UA: 0.2 (ref 0.0–1.0)
pH: 6 (ref 5.0–8.0)

## 2013-01-13 LAB — CBC WITH DIFFERENTIAL/PLATELET
Basophils Absolute: 0 10*3/uL (ref 0.0–0.1)
Basophils Relative: 0.2 % (ref 0.0–3.0)
Eosinophils Absolute: 0 10*3/uL (ref 0.0–0.7)
HCT: 35.5 % — ABNORMAL LOW (ref 36.0–46.0)
Hemoglobin: 11.9 g/dL — ABNORMAL LOW (ref 12.0–15.0)
Lymphs Abs: 2 10*3/uL (ref 0.7–4.0)
MCHC: 33.5 g/dL (ref 30.0–36.0)
Neutro Abs: 3.4 10*3/uL (ref 1.4–7.7)
RBC: 3.87 Mil/uL (ref 3.87–5.11)
RDW: 14.7 % — ABNORMAL HIGH (ref 11.5–14.6)

## 2013-01-13 LAB — BASIC METABOLIC PANEL
CO2: 29 mEq/L (ref 19–32)
Chloride: 103 mEq/L (ref 96–112)
Glucose, Bld: 93 mg/dL (ref 70–99)
Potassium: 4.9 mEq/L (ref 3.5–5.1)
Sodium: 138 mEq/L (ref 135–145)

## 2013-01-13 LAB — HEPATIC FUNCTION PANEL
AST: 47 U/L — ABNORMAL HIGH (ref 0–37)
Albumin: 4 g/dL (ref 3.5–5.2)
Total Protein: 7.3 g/dL (ref 6.0–8.3)

## 2013-01-13 LAB — LIPID PANEL
LDL Cholesterol: 76 mg/dL (ref 0–99)
Total CHOL/HDL Ratio: 3
Triglycerides: 106 mg/dL (ref 0.0–149.0)

## 2013-01-14 LAB — VITAMIN D 25 HYDROXY (VIT D DEFICIENCY, FRACTURES): Vit D, 25-Hydroxy: 45 ng/mL (ref 30–89)

## 2013-01-19 ENCOUNTER — Ambulatory Visit (INDEPENDENT_AMBULATORY_CARE_PROVIDER_SITE_OTHER)
Admission: RE | Admit: 2013-01-19 | Discharge: 2013-01-19 | Disposition: A | Discharge status: Home / Self Care | Payer: Managed Care, Other (non HMO) | Source: Ambulatory Visit | Attending: Pulmonary Disease | Admitting: Pulmonary Disease

## 2013-01-19 ENCOUNTER — Ambulatory Visit (INDEPENDENT_AMBULATORY_CARE_PROVIDER_SITE_OTHER): Payer: Managed Care, Other (non HMO) | Admitting: Pulmonary Disease

## 2013-01-19 ENCOUNTER — Encounter: Payer: Self-pay | Admitting: Pulmonary Disease

## 2013-01-19 VITALS — BP 120/64 | HR 87 | Temp 97.8°F | Ht 64.0 in | Wt 201.8 lb

## 2013-01-19 DIAGNOSIS — I10 Essential (primary) hypertension: Secondary | ICD-10-CM

## 2013-01-19 DIAGNOSIS — K5730 Diverticulosis of large intestine without perforation or abscess without bleeding: Secondary | ICD-10-CM

## 2013-01-19 DIAGNOSIS — K219 Gastro-esophageal reflux disease without esophagitis: Secondary | ICD-10-CM

## 2013-01-19 DIAGNOSIS — D7289 Other specified disorders of white blood cells: Secondary | ICD-10-CM

## 2013-01-19 DIAGNOSIS — D126 Benign neoplasm of colon, unspecified: Secondary | ICD-10-CM

## 2013-01-19 DIAGNOSIS — E785 Hyperlipidemia, unspecified: Secondary | ICD-10-CM

## 2013-01-19 DIAGNOSIS — Z0000 Encounter for general adult medical examination without abnormal findings: Secondary | ICD-10-CM

## 2013-01-19 DIAGNOSIS — I872 Venous insufficiency (chronic) (peripheral): Secondary | ICD-10-CM

## 2013-01-19 DIAGNOSIS — E663 Overweight: Secondary | ICD-10-CM

## 2013-01-19 DIAGNOSIS — K7689 Other specified diseases of liver: Secondary | ICD-10-CM

## 2013-01-19 NOTE — Progress Notes (Signed)
Subjective:     Patient ID: Morgan [name], female   DOB: [birth year], 62 y.o.   MRN: [id number]  HPI 62 y/o WF here for a follow up visit & CPX... she has multiple medical problems as noted below...    ~  January 09, 2012:  Yearly ROV & CPX> she reports ILI 12/12 treated w/ Tamiflu +OTC meds; then refractory sore throat & laryngitis treated by DrBates (we don't have notes) and f/u due soon, on Prevacid Bid & resting her voice- improved...    HBP> on MicardisHCT 40/12.5 & BP= 128/74; she denies CP, palpit, dizzy, SOB, edema, etc...    VenInsuffic> she remains under the care of DrFeatherston & the [facility]...    Hyperlipid> on Simva40 + Feno160; FLP looks good w/ all parameters wnl...    Overweight> wt is up 18# to 200# & we reviewed diet, exercise, etc...    GI- GERD, Divertics, Polyps, FLD> on Prev30Bid & must lose weight; LFTs remain sl elev w/ her wt gain...    Lymphocytosis> CBCs & smear reviewed; no change in her counts or blood smear...  ~  November 24, 2012:  57mo ROV & Morgan [name] presents for an add-on appt w/ sinus infection x1wk w/ congestion, green drainage, hoarseness, T100 +chills & aching; she had nosebleed last wk; we decided to treat w/ Depo, Medrol dosepak, Augmentin, Tussionex, & Align... She has had mult GI visits w/ DrPerry Antony Haste for "post infectious IBS" where she had 1-12 stools per day, now improved to just 1-2 daily on Align probiotic... BP controlled on the MicardisHCT, weight is stable on her diet & exercise but she needs to do better & get the wt down!  She has yearly CPX sched for 1/14....    We reviewed prob list, meds, xrays and labs> see below for updates >>   ~  January 19, 2013:  22mo ROV & here for annual CPX> prev URI symptoms resolved w/ Rx... We reviewed the following medical problems during today's office visit >>     HBP> on ASA81, MicardisHCT 40/12.5 & BP= 120/64; she denies CP, palpit, dizzy, SOB, edema, etc...    VenInsuffic> prev eval & Rx by DrFeatherston  & the [facility]; we discussed no salt, elevation, support hose...    Hyperlipid> on Simva40 + Feno160; FLP shows TChol 149, TG 106, HDL 52, LDL 76    Overweight> wt is up 6# to 202# & we reviewed diet, exercise, & the imperative for wt reduction etc...    GI- GERD, Divertics, Polyps> on Probiotic & off prev Prev30; she denies abd pain, dysphagia, n/v, c/d, blood seen; last colon 4/11 w/ divertics & diminutive polyp...    FLD> she reports that her younger sister had a liver transplant for cirrhosis- she indicates it was sudden onset ?related to meds for back pain? Morgan [name] has elev LFTs prob due to fatty liver dis & we reviewed the necessity for diet/ exercise/ wt reduction...    Lymphocytosis> CBCs & smear reviewed; no change in her counts or blood smear... We reviewed prob list, meds, xrays and labs> see below for updates >> she had the 2013 Flu vaccine 9/13... CXR 2/14 showed normal heart size, clear lungs, wnl/ NAD.Marland KitchenMarland Kitchen EKG 2/14 showed NSR, rate89, NSSTTWA, NAD... LABS 2/14:  FLP- all parameters at goals on Simva40+Feno160;  Chems- wnl x sl elev LFTs;  CBC- mild anemia w/ Hg=11.9, MCV=92;  TSH=1.54;  VitD=45;  UA- clear. AbdUltrasound 2/14:  incr liver echotexture, normal GB, no other  abn seen...          Problem List:     HEARING LOSS (ICD-389.9) - hx mild hi freq hearing loss- eval by DrPahel in 2004...  ALLERGIC RHINITIS (ICD-477.9) - on FEXOFENADINE 180mg /d... mild allergy symptoms... ~  12/13: presents w/ sinus infection 7 treated w/ Depo, Dosepak, Augmentin, Mucinex, Fluids, Tussionex...  ? of CHEST XRAY, ABNORMAL (ICD-793.1) -  ~  CXR 11/09 showed clear lungs, mild biapical pleural thickening, calcif Ao ~  routine CXR 12/10 showed ?sm nodular opac RUL, & CTChest did not reveal a nodule- just biapical pleuroparenchymal scarring, & fatty liver... ~  CXR 12/11 showed ?sm granuloma RUL, no active lung dis... ~  CXR 2/13 showed normal heart size, clear lungs, NAD.Marland Kitchen. ~  CXR 2/14 showed  normal heart size, clear lungs, wnl/ NAD.  HYPERTENSION (ICD-401.9) - on MICARDIS/ HCT 40-12.5 daily... ~  2DEcho 10/01 was WNL.Marland Kitchen. norm wall thickness, norm valves, norm EF... ~  NuclearStressTest 10/01 showed breast attenuation, normal wall motion, EF= 65%... ~  12/13: BP= 120/70 today and doing well- taking med regularly & tolerated well... denies HA, fatigue, visual changes, CP, palipit, dizziness, syncope, dyspnea, edema, etc. ~  2/14: on ASA81, MicardisHCT 40/12.5 & BP= 120/64; she denies CP, palpit, dizzy, SOB, edema, etc.  ABNORMAL ELECTROCARDIOGRAM (ICD-794.31) - hx intermittent RBBB/ IVCD in the past and few PVC's noted (Holter 1997)... she is asymptomatic w/o CP, palpit. dizzy. syncope, etc... ~  EKG 12/10 w/ RBBB, otherw neg... ~  EKG 2/14 showed showed NSR, rate89, NSSTTWA, NAD.  VENOUS INSUFFICIENCY (ICD-459.81) - on low sodium diet, elevation, compression hose, etc... she has been followed since 2007 by DrKrusch et al at the [facility] w/ surgery including endovenous chemical ablation & sclerotherapy in 2008, 2009, 2010, & 2011> she is very happy w/ her results & note from [facility] 11/11 is reviewed.  HYPERLIPIDEMIA (ICD-272.4) - on SIMVASTATIN 40mg /d & FENOFIBRATE 160mg /d... ~  FLP 6/08 showed TChol 213, TG 408, HDL 56, LDL 101... on Vytorin10/20, added Tricor145. ~  FLP 11/09 on Vytor10/20+Tric145 showed TChol 146, TG 75, HDL 52, LDL 79... changed to Simva40 + [id number] for $$ reasons... ~  FLP 12/10 on Simva40+Feno160 showed TChol 162, TG 85, HDL 54, LDL 91 ~  FLP 12/11 on Simva40+Feno160 showed TChol 149, TG 60, HDL 59, LDL 79 ~  FLP 2/13 on Simva40+Feno160 showed TChol 150, TG 85, HDL 54, LDL 79 ~  FLP 2/14 on Simva40+Feno160 showed TChol 149, TG 106, HDL 52, LDL 76  OVERWEIGHT (ICD-278.02) - we discussed diet + exercise therapy... ~  peak weight = 220# in 2007 ~  weight 11/09 = 199# ~  weight 12/10 = 189#... Good job, keep it up! ~  weight 12/11 = 182# ~  Weight  2/13 = 200#... We reviewed need for diet/ exercise... ~  Weight 12/13 = 197# ~  Weight 2/14 = 202#  GERD (ICD-530.81) - prev on PREVACID 30mg  Bid per DrBates for LER... ~  EGD in 1976 by DrSam showed mild gastritis... ~  12/10: she notes intermittent choking, dysphagia> EGD 4/11 by DrPerry was normal/ neg. ~  12/12: eval by DrBates for throat symptoms believed due to LER & placed on Prev30Bid... ~  2/14: she stopped the PPI on her own, take Align daily; reminded that the Prev30 was for her LPR, throat symptoms and cough...  DIVERTICULOSIS OF COLON (ICD-562.10), & COLONIC POLYPS (ICD-211.3) ~  colonoscopy 12/05 by DrPerry showed divertics and 2-98mm polyps= adenomatous and hyperplastic.Marland KitchenMarland Kitchen ~  8/09: bout of diverticulitis treated by DrPerry... ~  colonoscopy 4/11 by DrPerry showed mild sigm divertics, diminutive polyp= polypoid mucosa only... ~  2013:  She had thorough eval by DrPerry for diarrhea up to 10-12/d & dx w/ "post infectious IBS" she says; now improved on ALIGN daily...  FATTY LIVER DISEASE (ICD-571.8) - she has mild elev LFTs & instructed on low fat diet, wt reduction strategy. ~  LFTs 11/09 (wt=191#) showed AlkPhos=43, SGOT=28, SGPT=31 ~  LFTs 12/10 (wt=189#) showed AlkPhos=47, SGOT=36, SGPT=38 ~  CT Chest 1/11 showed fatty liver as an incidental finding. ~  LFTs 12/11 (wt=182#) showed AlkPhos=44, SGOT=52, SGPT=44 ~  LFTs 2/13 (wt=200#) showed AlkPhos= 53, SGOT=48, SGPT=55... Must get weight down! ~  2/14: she reports that her younger sister had a liver transplant for cirrhosis- she indicates it was sudden onset ?related to meds for back pain? Morgan [name] has elev LFTs prob due to fatty liver dis & we reviewed the necessity for diet/ exercise/ wt reduction.   Hx of CYSTITIS (ICD-595.9) - Urology eval 11/08 by ZOXWRUEA for chr cystitis...  Hx of NECK PAIN (ICD-723.1) - eval 11/09 w/ XRays showing some spondylosis & Rx w/ Tramadol, Rest, Heat, Tylenol...  CONTACT DERMATITIS DUE TO  POISON IVY (ICD-692.6)  LYMPHOCYTOSIS (ICD-288.8) - CBC's show a mononuclear lymphocytosis of ? signif... ~  CBC 2001 w/ WBC= 7.4 & 15% lymphs, 8% monos... ~  CBC 10/07 showed WBC= 7.6 w/ 23% lymphs, 7% monos... ~  CBC 2/08 showed WBC= 7.8 w/ 23% lymphs, 7% monos... ~  CBC 5/09 showed WBC= 8.7 w/ 20% lymphs, 19% monos... ~  CBC 11/09 showed WBC= 5.5 w/ 34% lymphs, 24% monos... repeat= 21% monos and pathologist review (DrHessling)= "normal RBC, WBC, plat morphology... normal peripheral blood smear"... ~  CBC 12/10 showed WBC= 5.5, Lymphs= 35%, Monos= 25% ~  CBC 12/11 showed WBC= 6.3, Lymph=29%, Mono=29% ~  CBC 2/13 showed WBC= 8.2, Lymph=32%, Mono=27%... Smear reviewed by path> no abn cells... ~  CBC 2/14 showed WBC=7.6, Lymphs=27%, Mono=28%  Health Maintenance:   she is under some stress related to her husb smoking. ~  GI:  DrPerry & up to date on colon screening... ~  GYN= DrMcPhail on Estrdiol 0.5, +calcium, MVI... she gets PAP, Mammograms, etc... ~  Immunizations:  she gets the yearly seasonal Flu vaccine... Pneumovax due age 70... ?last Tetanus shot.   Past Surgical History  Procedure Laterality Date  . Total abdominal hysterectomy  12/86  . Inner ear surgery      childhood; right  . Nasal septum surgery    . Foot surgery      left  . Chemical and laser endovenous ablation of le veins  2008, 2009, 2010, 2011    Dr Verl Blalock al    Outpatient Encounter Prescriptions as of 01/19/2013  Medication Sig Dispense Refill  . aspirin 81 MG tablet Take 81 mg by mouth daily. Takes 4 times weekly due to bruising      . calcium carbonate (OS-CAL) 600 MG TABS Take 2 tablets by mouth daily.        . chlorpheniramine-HYDROcodone (TUSSIONEX PENNKINETIC ER) 10-8 MG/5ML LQCR Take 5 mLs by mouth every 12 (twelve) hours.  140 mL  2  . fenofibrate 160 MG tablet Take 1 tablet (160 mg total) by mouth daily.  90 tablet  3  . fexofenadine (ALLEGRA) 180 MG tablet Take 180 mg by mouth daily.      .  Multiple Vitamin (MULTIVITAMIN) tablet Take 1 tablet  by mouth daily.        . Probiotic Product (PROBIOTIC PO) Take by mouth daily.      . simvastatin (ZOCOR) 40 MG tablet Take 1 tablet (40 mg total) by mouth at bedtime.  90 tablet  3  . telmisartan-hydrochlorothiazide (MICARDIS HCT) 40-12.5 MG per tablet Take 1 tablet by mouth daily.  90 tablet  3  . [DISCONTINUED] amoxicillin-clavulanate (AUGMENTIN) 875-125 MG per tablet Take 1 tablet by mouth 2 (two) times daily.  14 tablet  0  . [DISCONTINUED] bifidobacterium infantis (ALIGN) capsule Take 1 capsule by mouth daily.  14 capsule  0  . [DISCONTINUED] methylPREDNISolone (MEDROL, PAK,) 4 MG tablet follow package directions  21 tablet  0   No facility-administered encounter medications on file as of 01/19/2013.    No Known Allergies   Current Medications, Allergies, Past Medical History, Past Surgical History, Family History, and Social History were reviewed in [employer] record.   Review of Systems         The patient complains of nocturia, arthritis, and anxiety.  The patient denies fever, chills, sweats, anorexia, fatigue, weakness, malaise, weight loss, sleep disorder, blurring, diplopia, eye irritation, eye discharge, vision loss, eye pain, photophobia, earache, ear discharge, tinnitus, decreased hearing, nasal congestion, nosebleeds, sore throat, hoarseness, chest pain, palpitations, syncope, dyspnea on exertion, orthopnea, PND, peripheral edema, cough, dyspnea at rest, excessive sputum, hemoptysis, wheezing, pleurisy, nausea, vomiting, diarrhea, constipation, change in bowel habits, abdominal pain, melena, hematochezia, jaundice, gas/bloating, indigestion/heartburn, dysphagia, odynophagia, dysuria, hematuria, urinary frequency, urinary hesitancy, incontinence, back pain, joint pain, joint swelling, muscle cramps, muscle weakness, stiffness, sciatica, restless legs, leg pain at night, leg pain with exertion, rash,  itching, dryness, suspicious lesions, paralysis, paresthesias, seizures, tremors, vertigo, transient blindness, frequent falls, frequent headaches, difficulty walking, depression, memory loss, confusion, cold intolerance, heat intolerance, polydipsia, polyphagia, polyuria, unusual weight change, abnormal bruising, bleeding, enlarged lymph nodes, urticaria, allergic rash, hay fever, and recurrent infections.     Objective:   Physical Exam     WD, Overweight, 62 y/o WF in NAD... GENERAL:  Alert & oriented; pleasant & cooperative... HEENT:  [place]/AT, EOM-wnl, PERRLA, EACs-clear, TMs-wnl, NOSE-clear, THROAT-clear & wnl. NECK:  Supple w/ fairROM; no JVD; normal carotid impulses w/o bruits; no thyromegaly or nodules palpated; no lymphadenopathy. CHEST:  Clear to P & A; without wheezes/ rales/ or rhonchi. HEART:  Regular Rhythm; without murmurs/ rubs/ or gallops. ABDOMEN:  Soft & nontender, +panniculus; normal bowel sounds; no organomegaly or masses detected. EXT: without deformities, mild arthritic changes; varicose veins/ +venous insuffic/ tr edema. NEURO:  CN's intact; motor testing normal; sensory testing normal; gait normal & balance OK. DERM:  No lesions noted; no rash etc...  RADIOLOGY DATA:  Reviewed in the EPIC EMR & discussed w/ the patient...     LABORATORY DATA:  Reviewed in the EPIC EMR & discussed w/ the patient...     Assessment:     AR & Laryngitis>  Eval & Rx by DrBates; on MMW, PPI Bid (she stopped & advised to restart), etc; she has maintained f/u w/ ENT...  Hx Abn CXR>  Small RUL granuloma & bilat apic pleural thickening; no acute changes...  HBP>  Controlled on the MicardisHCT; advised low sodium & get wt down!!!  Ven Insuffic>  Followed by [facility]Featherston at [facility]...  Hyperlipid>  On Simva40 + Feno160 & FLP looks great; needs better diet & wt reduction...  Overweight>  Wt up to 202# & she knows the drill: count  calories, eat less, exercise, burn more...  GI> GERD,  Divertics, Polyps, FLD>  Continue PPI Rx and needs to lose wt to improve the LFTs; "post infectious IBS" improved w/ Align; must lose wt to improve LFTs...  LYMPHOCYTOSIS>  CBC is stable w/ mononuclear lymphocytosis, no change, smear reviewed by pathologist- ok no abn cells...     Plan:     Patient's Medications  New Prescriptions   No medications on file  Previous Medications   ASPIRIN 81 MG TABLET    Take 81 mg by mouth daily. Takes 4 times weekly due to bruising   CALCIUM CARBONATE (OS-CAL) 600 MG TABS    Take 2 tablets by mouth daily.     CHLORPHENIRAMINE-HYDROCODONE (TUSSIONEX PENNKINETIC ER) 10-8 MG/5ML LQCR    Take 5 mLs by mouth every 12 (twelve) hours.   FEXOFENADINE (ALLEGRA) 180 MG TABLET    Take 180 mg by mouth daily.   MULTIPLE VITAMIN (MULTIVITAMIN) TABLET    Take 1 tablet by mouth daily.     PROBIOTIC PRODUCT (PROBIOTIC PO)    Take by mouth daily.  Modified Medications   Modified Medication Previous Medication   FENOFIBRATE 160 MG TABLET fenofibrate 160 MG tablet      Take 1 tablet (160 mg total) by mouth daily.    Take 1 tablet (160 mg total) by mouth daily.   SIMVASTATIN (ZOCOR) 40 MG TABLET simvastatin (ZOCOR) 40 MG tablet      Take 1 tablet (40 mg total) by mouth at bedtime.    Take 1 tablet (40 mg total) by mouth at bedtime.   TELMISARTAN-HYDROCHLOROTHIAZIDE (MICARDIS HCT) 40-12.5 MG PER TABLET telmisartan-hydrochlorothiazide (MICARDIS HCT) 40-12.5 MG per tablet      Take 1 tablet by mouth daily.    Take 1 tablet by mouth daily.  Discontinued Medications   AMOXICILLIN-CLAVULANATE (AUGMENTIN) 875-125 MG PER TABLET    Take 1 tablet by mouth 2 (two) times daily.   BIFIDOBACTERIUM INFANTIS (ALIGN) CAPSULE    Take 1 capsule by mouth daily.   METHYLPREDNISOLONE (MEDROL, PAK,) 4 MG TABLET    follow package directions

## 2013-01-19 NOTE — Patient Instructions (Addendum)
Today we updated your med list in our EPIC system...    Continue your current medications the same...  Today we reviewed your recent fasting blood work & gave you a copy for your records...   Today we also did your follow up CXR & EKG... We will arrange for an Abd Ultrasound to check your liver,     and we will contact you w/ the results when avail...  Call for any questions...  Let's get on track w/ our diet & exercise program....    The goal is to lose 20+ lbs by the time the baby is born!!! (congrats!!!)  Let's plan a brief mid-yr follow up visit in 6 months.Marland KitchenMarland Kitchen

## 2013-01-20 ENCOUNTER — Telehealth: Payer: Self-pay | Admitting: Pulmonary Disease

## 2013-01-20 MED ORDER — FENOFIBRATE 160 MG PO TABS: 160.0000 mg | ORAL_TABLET | Freq: Every day | ORAL | Status: DC

## 2013-01-20 MED ORDER — TELMISARTAN-HCTZ 40-12.5 MG PO TABS: 1.0000 | ORAL_TABLET | Freq: Every day | ORAL | Status: DC

## 2013-01-20 MED ORDER — SIMVASTATIN 40 MG PO TABS: 40.0000 mg | ORAL_TABLET | Freq: Every day | ORAL | Status: DC

## 2013-01-20 NOTE — Telephone Encounter (Signed)
Pt aware rx's have been sent. Nothing further was needed

## 2013-01-21 ENCOUNTER — Ambulatory Visit (INDEPENDENT_AMBULATORY_CARE_PROVIDER_SITE_OTHER): Payer: Managed Care, Other (non HMO) | Admitting: Internal Medicine

## 2013-01-21 ENCOUNTER — Encounter: Payer: Self-pay | Admitting: Internal Medicine

## 2013-01-21 VITALS — BP 134/76 | HR 76 | Ht 64.0 in | Wt 202.0 lb

## 2013-01-21 DIAGNOSIS — K589 Irritable bowel syndrome without diarrhea: Secondary | ICD-10-CM

## 2013-01-21 DIAGNOSIS — L738 Other specified follicular disorders: Secondary | ICD-10-CM

## 2013-01-21 DIAGNOSIS — L739 Follicular disorder, unspecified: Secondary | ICD-10-CM

## 2013-01-21 DIAGNOSIS — K219 Gastro-esophageal reflux disease without esophagitis: Secondary | ICD-10-CM

## 2013-01-21 DIAGNOSIS — R197 Diarrhea, unspecified: Secondary | ICD-10-CM

## 2013-01-21 NOTE — Progress Notes (Signed)
HISTORY OF PRESENT ILLNESS:  Morgan [name] is a 62 y.o. female with the below listed medical history who has been followed in this office for a history of adenomatous colon polyps, GERD, and most recently persistent diarrhea. For the latter, she has undergone extensive workup which has been negative. Failure to respond completely to multiple empiric therapies. She was last seen 10/08/2012. She was felt to have postinfectious IBS. Serologic screening for celiac sprue was normal. She was to continue with Levbid and Lomotil as needed. Routine followup was scheduled for today. She is pleased to report that she is significantly improved. Currently has 2-3 formed bowel movements, generally in the morning. No associated abdominal pain, urgency, or nocturnal stools. Occasional minor bleeding. Last colonoscopy September 2013. She is not requiring any GI medications. Her weight is stable. No new complaints. REVIEW OF SYSTEMS:  All non-GI ROS negative except for buttocks rash Past Medical History  Diagnosis Date  . Hearing loss   . Allergic rhinitis   . Abnormal chest x-ray   . Abnormal electrocardiogram   . Venous insufficiency   . Hyperlipidemia   . Overweight   . GERD (gastroesophageal reflux disease)   . Diverticulosis of colon   . Colon polyp     polypoid colorectal mucosa/adenomatous  . Fatty liver disease, nonalcoholic   . Cystitis   . Neck pain   . Contact dermatitis due to poison ivy   . Lymphocytosis   . Hemorrhoids     Past Surgical History  Procedure Laterality Date  . Total abdominal hysterectomy  12/86  . Inner ear surgery      childhood; right  . Nasal septum surgery    . Foot surgery      left  . Chemical and laser endovenous ablation of le veins  2008, 2009, 2010, 2011    Dr Donia Ast et al    Social History [name]  reports that she quit smoking about 11 years ago. She has never used smokeless tobacco. She reports that  drinks alcohol. She reports that she does  not use illicit drugs.  family history includes Breast cancer in her other; Diabetes in her maternal aunt and maternal uncle; Healthy in her sister; Heart disease in her sister; Heart failure in her brother and son; Hyperlipidemia in her mother; Hypertension in her mother; and Liver disease in her sister.  There is no history of Colon cancer.  No Known Allergies     PHYSICAL EXAMINATION: Vital signs: BP 134/76  Pulse 76  Ht 5\' 4"  (1.626 m)  Wt 202 lb (91.627 kg)  BMI 34.66 kg/m2 General: Well-developed, well-nourished, no acute distress HEENT: Sclerae are anicteric, conjunctiva pink. Oral mucosa intact Lungs: Clear Heart: Regular Abdomen: soft, nontender, nondistended, no obvious ascites, no peritoneal signs, normal bowel sounds. No organomegaly. Skin: Mild asymmetric folliculitis on the right buttock cheek. Extremities: No edema Psychiatric: alert and oriented x3. Cooperative   ASSESSMENT:  #1. Postinfectious IBS. Improve #2. History of adenomatous colon polyps. No neoplasia September 2013 #3. History of GERD. No active symptoms off medication #4. Mild folliculitis   PLAN:  #1. Expectant management #2. Surveillance colonoscopy 2018. Interval GI followup as needed #3. Reflux precautions #4. Soap and water buttock and dry well. If problems persist or worsen, she is agreed to seek consultation with a dermatologist. #5. Return to the care of your PCP.

## 2013-01-21 NOTE — Patient Instructions (Addendum)
Please follow up with Dr. Perry as needed 

## 2013-01-22 ENCOUNTER — Ambulatory Visit (HOSPITAL_COMMUNITY)
Admission: RE | Admit: 2013-01-22 | Discharge: 2013-01-22 | Disposition: A | Discharge status: Home / Self Care | Payer: Managed Care, Other (non HMO) | Source: Ambulatory Visit | Attending: Pulmonary Disease | Admitting: Pulmonary Disease

## 2013-01-22 DIAGNOSIS — K7689 Other specified diseases of liver: Secondary | ICD-10-CM

## 2013-03-17 ENCOUNTER — Ambulatory Visit (INDEPENDENT_AMBULATORY_CARE_PROVIDER_SITE_OTHER): Payer: Managed Care, Other (non HMO) | Admitting: Nurse Practitioner

## 2013-03-17 ENCOUNTER — Encounter: Payer: Self-pay | Admitting: Nurse Practitioner

## 2013-03-17 ENCOUNTER — Telehealth: Payer: Self-pay | Admitting: Internal Medicine

## 2013-03-17 VITALS — BP 148/72 | HR 68 | Ht 64.0 in | Wt 205.2 lb

## 2013-03-17 DIAGNOSIS — M898X9 Other specified disorders of bone, unspecified site: Secondary | ICD-10-CM

## 2013-03-17 DIAGNOSIS — M949 Disorder of cartilage, unspecified: Secondary | ICD-10-CM

## 2013-03-17 DIAGNOSIS — M899 Disorder of bone, unspecified: Secondary | ICD-10-CM

## 2013-03-17 NOTE — Patient Instructions (Addendum)
Take over the counter Motrin 600 mg twice daily for one week. Also take samples of Prilosec one tablet by mouth once daily for one week while taking Motrin.   If you feel no improvement in 1-2 weeks, please call Dr. Kriste Basque for your hip pain. If your develop a fever, blood in stool, Nausea or vomiting then please call our office back.

## 2013-03-17 NOTE — Progress Notes (Signed)
  History of Present Illness:   Patient is a 62 year old female known to Dr. Marina Goodell for history of GERD, adenomatous colon polyps, diverticulitis, most recently diarrhea felt to be post infectious IBS. She is worked in today for evaluation of acute RLQ pain.This is a new type of pain. It began as intermittent "twinges" two days ago. During the night pain became more constant, more severe. She took Aleve, finally fell asleep. Today pain is a dull ache with intermittent stabbing. No urinary symptoms. Bowel movements are more loose and slightly more frequent than normal. No rectal bleeding. No fevers. No nausea. No antibiotics in last few months. No sick contacts. No recent medication changes.    Current Medications, Allergies, Past Medical History, Past Surgical History, Family History and Social History were reviewed in [employer] record.   Physical Exam: General: pleasant white female in no acute distress Head: Normocephalic and atraumatic Eyes:  sclerae anicteric, conjunctiva pink  Ears: Normal auditory acuity Lungs: Clear throughout to auscultation Heart: Regular rate and rhythm Abdomen: Soft, non distended. Very localized area of tenderness just next to right iliac crest. Negative psoas sign, negative obturator sign. No masses, no hepatomegaly. Normal bowel sounds Musculoskeletal: Symmetrical with no gross deformities. Right iliac crest moderately tender to palpation.   Extremities: No edema  Neurological: Alert oriented x 4, grossly nonfocal Psychological:  Alert and cooperative. Normal mood and affect  Assessment and Recommendations:  Acute RLQ pain. Based on exam, pain most c/w with skeletal pain as she is definitely tender over right iliac crest. I cannot associate her bowel habit changes with the pain but she has had problems with loose stools in past and had an unremarkable colonoscopy Sept 2013 (except for left sided diverticulosis). Will try 600mg  Ibuprofen  BID for 7 days. I gave her PPI samples to take with the NSAID to hopefully avoid mucosal injury to GI tract. If no improvement after a week patient will contact PCP for further evaluation. At the close of our visit patient recalled a fall at home a few weeks ago. She has noticed some mild discomfort in right hip.Marland Kitchen

## 2013-03-17 NOTE — Telephone Encounter (Signed)
Patient reports right sided abdominal pain that started yesterday.  Had difficulty resting last night due to the pain.  She will come in this am to see Willette Cluster RNP at 11:00

## 2013-03-18 DIAGNOSIS — M898X9 Other specified disorders of bone, unspecified site: Secondary | ICD-10-CM | POA: Insufficient documentation

## 2013-03-18 NOTE — Progress Notes (Signed)
Agree with initial assessment and plans as outlined 

## 2013-11-17 ENCOUNTER — Telehealth: Payer: Self-pay | Admitting: Pulmonary Disease

## 2013-11-17 DIAGNOSIS — Z0000 Encounter for general adult medical examination without abnormal findings: Secondary | ICD-10-CM

## 2013-11-17 NOTE — Telephone Encounter (Signed)
Pt is aware that orders have been placed for blood work. Nothing further was needed.

## 2013-11-18 ENCOUNTER — Other Ambulatory Visit (INDEPENDENT_AMBULATORY_CARE_PROVIDER_SITE_OTHER): Payer: Managed Care, Other (non HMO)

## 2013-11-18 DIAGNOSIS — Z0000 Encounter for general adult medical examination without abnormal findings: Secondary | ICD-10-CM

## 2013-11-18 LAB — CBC WITH DIFFERENTIAL/PLATELET
Basophils Relative: 0.1 % (ref 0.0–3.0)
Eosinophils Absolute: 0.1 10*3/uL (ref 0.0–0.7)
Eosinophils Relative: 0.5 % (ref 0.0–5.0)
HCT: 35.3 % — ABNORMAL LOW (ref 36.0–46.0)
Hemoglobin: 12 g/dL (ref 12.0–15.0)
Lymphocytes Relative: 16.4 % (ref 12.0–46.0)
Lymphs Abs: 1.9 10*3/uL (ref 0.7–4.0)
MCHC: 34 g/dL (ref 30.0–36.0)
MCV: 89.8 fl (ref 78.0–100.0)
Monocytes Absolute: 3 10*3/uL — ABNORMAL HIGH (ref 0.1–1.0)
Monocytes Relative: 25.8 % — ABNORMAL HIGH (ref 3.0–12.0)
Neutrophils Relative %: 57.2 % (ref 43.0–77.0)
Platelets: 117 10*3/uL — ABNORMAL LOW (ref 150.0–400.0)
RBC: 3.93 Mil/uL (ref 3.87–5.11)
WBC: 11.8 10*3/uL — ABNORMAL HIGH (ref 4.5–10.5)

## 2013-11-18 LAB — BASIC METABOLIC PANEL WITH GFR
CO2: 27 mEq/L (ref 19–32)
Calcium: 9.8 mg/dL (ref 8.4–10.5)
Creat: 0.98 mg/dL (ref 0.50–1.10)
GFR, Est African American: 71 mL/min
Sodium: 142 mEq/L (ref 135–145)

## 2013-11-18 LAB — HEPATIC FUNCTION PANEL
AST: 45 U/L — ABNORMAL HIGH (ref 0–37)
Albumin: 4.3 g/dL (ref 3.5–5.2)
Alkaline Phosphatase: 74 U/L (ref 39–117)
Bilirubin, Direct: 0.1 mg/dL (ref 0.0–0.3)
Total Bilirubin: 0.7 mg/dL (ref 0.3–1.2)
Total Protein: 7.3 g/dL (ref 6.0–8.3)

## 2013-11-18 LAB — LIPID PANEL
HDL: 43.1 mg/dL (ref >39.00)
LDL Cholesterol: 80 mg/dL (ref 0–99)
Total CHOL/HDL Ratio: 3
Triglycerides: 103 mg/dL (ref 0.0–149.0)

## 2013-11-18 LAB — TSH: TSH: 2.11 u[IU]/mL (ref 0.35–5.50)

## 2013-11-23 ENCOUNTER — Ambulatory Visit (INDEPENDENT_AMBULATORY_CARE_PROVIDER_SITE_OTHER): Payer: Managed Care, Other (non HMO) | Admitting: Pulmonary Disease

## 2013-11-23 ENCOUNTER — Telehealth: Payer: Self-pay | Admitting: Oncology

## 2013-11-23 ENCOUNTER — Encounter: Payer: Self-pay | Admitting: Pulmonary Disease

## 2013-11-23 ENCOUNTER — Encounter (INDEPENDENT_AMBULATORY_CARE_PROVIDER_SITE_OTHER): Payer: Self-pay

## 2013-11-23 VITALS — BP 142/74 | HR 56 | Temp 97.7°F | Ht 64.0 in | Wt 205.6 lb

## 2013-11-23 DIAGNOSIS — K5730 Diverticulosis of large intestine without perforation or abscess without bleeding: Secondary | ICD-10-CM

## 2013-11-23 DIAGNOSIS — D7289 Other specified disorders of white blood cells: Secondary | ICD-10-CM

## 2013-11-23 DIAGNOSIS — D126 Benign neoplasm of colon, unspecified: Secondary | ICD-10-CM

## 2013-11-23 DIAGNOSIS — Z87898 Personal history of other specified conditions: Secondary | ICD-10-CM | POA: Insufficient documentation

## 2013-11-23 DIAGNOSIS — E663 Overweight: Secondary | ICD-10-CM

## 2013-11-23 DIAGNOSIS — K7689 Other specified diseases of liver: Secondary | ICD-10-CM

## 2013-11-23 DIAGNOSIS — I872 Venous insufficiency (chronic) (peripheral): Secondary | ICD-10-CM

## 2013-11-23 DIAGNOSIS — I10 Essential (primary) hypertension: Secondary | ICD-10-CM

## 2013-11-23 DIAGNOSIS — E785 Hyperlipidemia, unspecified: Secondary | ICD-10-CM

## 2013-11-23 DIAGNOSIS — D696 Thrombocytopenia, unspecified: Secondary | ICD-10-CM | POA: Insufficient documentation

## 2013-11-23 DIAGNOSIS — K219 Gastro-esophageal reflux disease without esophagitis: Secondary | ICD-10-CM

## 2013-11-23 MED ORDER — TELMISARTAN-HCTZ 40-12.5 MG PO TABS: 1.0000 | ORAL_TABLET | Freq: Every day | ORAL | Status: DC

## 2013-11-23 MED ORDER — FENOFIBRATE 160 MG PO TABS: 160.0000 mg | ORAL_TABLET | Freq: Every day | ORAL | Status: DC

## 2013-11-23 MED ORDER — SIMVASTATIN 40 MG PO TABS: 40.0000 mg | ORAL_TABLET | Freq: Every day | ORAL | Status: DC

## 2013-11-23 NOTE — Patient Instructions (Signed)
Today we updated your med list in our EPIC system...    Continue your current medications the same...    We refilled the meds you requested...  We reviewed your recent blood work & gave you a copy...    In light of your recent nose bleeds- and the lab showing a drop in your platelet count- I feel it would be helpful to obtain a Hematology consultation...    We will set this up at the [facility] (Hematology clinic it there)...  Morgan [name]- we need to get on track w/ our diet 7 exercise program...     The goal is to lose 15-20 lbs...  YOU CAN DO IT!!!  Call for any questions...  Let's plan a follow up visit in 37mo, sooner if needed for problems.Marland KitchenMarland Kitchen

## 2013-11-23 NOTE — Telephone Encounter (Signed)
S/W RHONDA AT DR. SCOTT NADEL OFFICE AND GVE NP APPT 01/07 @ 10:30 W/DR. SHADAD DX- THROMBOCYTOPENIA WELCOME PACKET MAILED.

## 2013-11-23 NOTE — Progress Notes (Signed)
Subjective:     Patient ID: Morgan [name], female   DOB: 02-20-1951, 62 y.o.   MRN: [id number]  HPI 62 y/o WF here for a follow up visit & CPX... she has multiple medical problems as noted below...    ~  January 09, 2012:  Yearly ROV & CPX> she reports ILI 12/12 treated w/ Tamiflu +OTC meds; then refractory sore throat & laryngitis treated by DrBates (we don't have notes) and f/u due soon, on Prevacid Bid & resting her voice- improved...    HBP> on MicardisHCT 40/12.5 & BP= 128/74; she denies CP, palpit, dizzy, SOB, edema, etc...    VenInsuffic> she remains under the care of DrFeatherston & the [facility]...    Hyperlipid> on Simva40 + Feno160; FLP looks good w/ all parameters wnl...    Overweight> wt is up 18# to 200# & we reviewed diet, exercise, etc...    GI- GERD, Divertics, Polyps, FLD> on Prev30Bid & must lose weight; LFTs remain sl elev w/ her wt gain...    Lymphocytosis> CBCs & smear reviewed; no change in her counts or blood smear...  ~  November 24, 2012:  60mo ROV & Morgan [name] presents for an add-on appt w/ sinus infection x1wk w/ congestion, green drainage, hoarseness, T100 +chills & aching; she had nosebleed last wk; we decided to treat w/ Depo, Medrol dosepak, Augmentin, Tussionex, & Align... She has had mult GI visits w/ DrPerry Antony Haste for "post infectious IBS" where she had 1-12 stools per day, now improved to just 1-2 daily on Align probiotic... BP controlled on the MicardisHCT, weight is stable on her diet & exercise but she needs to do better & get the wt down!  She has yearly CPX sched for 1/14....    We reviewed prob list, meds, xrays and labs> see below for updates >>   ~  January 19, 2013:  53mo ROV & here for annual CPX> prev URI symptoms resolved w/ Rx... We reviewed the following medical problems during today's office visit >>     HBP> on ASA81, MicardisHCT 40/12.5 & BP= 120/64; she denies CP, palpit, dizzy, SOB, edema, etc...    VenInsuffic> prev eval & Rx by DrFeatherston  & the [facility]; we discussed no salt, elevation, support hose...    Hyperlipid> on Simva40 + Feno160; FLP shows TChol 149, TG 106, HDL 52, LDL 76    Overweight> wt is up 6# to 202# & we reviewed diet, exercise, & the imperative for wt reduction etc...    GI- GERD, Divertics, Polyps> on Probiotic & off prev Prev30; she denies abd pain, dysphagia, n/v, c/d, blood seen; last colon 4/11 w/ divertics & diminutive polyp...    FLD> she reports that her younger sister had a liver transplant for cirrhosis- she indicates it was sudden onset ?related to meds for back pain? Morgan [name] has elev LFTs prob due to fatty liver dis & we reviewed the necessity for diet/ exercise/ wt reduction...    Lymphocytosis> CBCs & smear reviewed; no change in her counts or blood smear... We reviewed prob list, meds, xrays and labs> see below for updates >> she had the 2013 Flu vaccine 9/13... CXR 2/14 showed normal heart size, clear lungs, wnl/ NAD.Marland KitchenMarland Kitchen EKG 2/14 showed NSR, rate89, NSSTTWA, NAD... LABS 2/14:  FLP- all parameters at goals on Simva40+Feno160;  Chems- wnl x sl elev LFTs;  CBC- mild anemia w/ Hg=11.9, MCV=92;  TSH=1.54;  VitD=45;  UA- clear. AbdUltrasound 2/14:  incr liver echotexture, normal GB, no other  abn seen...  ~  November 23, 2013:  31mo ROV & Morgan [name] is c/o pain in right knee & hip w/ walking, exercising; but "really it's all my joints" and she gets relief from Aleve- we discussed refer to DrDuda Ortho for additional eval...   She is also c/o bruising & several recent nose bleeds- note is made of her mild thrombocytopenia and mononuclear lymphocytosis & we felt that it was time for Heme referral for consultation... She has cut her ASA to 81mg  Qod on her own... We reviewed the following medical problems during today's office visit >>     HBP> on ASA81Qod, MicardisHCT 40/12.5 & BP= 142/74; she denies CP, palpit, dizzy, SOB, edema, etc...    VenInsuffic> prev eval & Rx by DrFeatherston & the [facility]; we  discussed no salt, elevation, support hose...    Hyperlipid> on Simva40 + Feno160; FLP 12/14 shows TChol 144, TG 103, HDL 43, LDL 80    Overweight> wt is up 5# to 206# & we reviewed diet, exercise, & the imperative for wt reduction etc...    GI- GERD, Divertics, Polyps> on Probiotic & off prev Prev30; she denies abd pain, dysphagia, n/v, c/d, blood seen; last colon 9/13 w/ divertics & no polpys...    FLD> she reports that her younger sister had a liver transplant for cirrhosis- she indicates it was sudden onset ?related to meds for back pain? Morgan [name] has elev LFTs due to fatty liver dis & we reviewed the necessity for diet/ exercise/ wt reduction...    Lymphocytosis, thrombocytopenia, epistaxis & bruising> CBCs & smear reviewed; she notes recent ([id number]) epistaxis & labs showed Plat=117K and WBC diff has 26% monos; we discussed referral to Heme for their opinion... We reviewed prob list, meds, xrays and labs> see below for updates >>  LABS 12/14:  FLP- at goals on Simva40+Feno160;  Chems- wnl x min elev of LFTs-got/gpt;  CBC- Hg=12.0, WBC=11.8 w/ 26%monos, Plat=117K;  TSH=2.11;  VitD=55...          Problem List:     HEARING LOSS (ICD-389.9) - hx mild hi freq hearing loss- eval by DrPahel in 2004...  ALLERGIC RHINITIS (ICD-477.9) - on FEXOFENADINE 180mg /d... mild allergy symptoms... ~  12/13: presents w/ sinus infection 7 treated w/ Depo, Dosepak, Augmentin, Mucinex, Fluids, Tussionex...  ? of CHEST XRAY, ABNORMAL (ICD-793.1) -  ~  CXR 11/09 showed clear lungs, mild biapical pleural thickening, calcif Ao ~  routine CXR 12/10 showed ?sm nodular opac RUL, & CTChest did not reveal a nodule- just biapical pleuroparenchymal scarring, & fatty liver... ~  CXR 12/11 showed ?sm granuloma RUL, no active lung dis... ~  CXR 2/13 showed normal heart size, clear lungs, NAD.Marland Kitchen. ~  CXR 2/14 showed normal heart size, clear lungs, wnl/ NAD.  HYPERTENSION (ICD-401.9) - on MICARDIS/ HCT 40-12.5 daily... ~   2DEcho 10/01 was WNL.Marland Kitchen. norm wall thickness, norm valves, norm EF... ~  NuclearStressTest 10/01 showed breast attenuation, normal wall motion, EF= 65%... ~  12/13: BP= 120/70 today and doing well- taking med regularly & tolerated well... denies HA, fatigue, visual changes, CP, palipit, dizziness, syncope, dyspnea, edema, etc. ~  2/14: on ASA81, MicardisHCT 40/12.5 & BP= 120/64; she denies CP, palpit, dizzy, SOB, edema, etc. ~  12/14: on ASA81Qod, MicardisHCT 40/12.5 & BP= 142/74; she denies CP, palpit, dizzy, SOB, edema, etc  ABNORMAL ELECTROCARDIOGRAM (ICD-794.31) - hx intermittent RBBB/ IVCD in the past and few PVC's noted (Holter 1997)... she is asymptomatic w/o CP, palpit. dizzy. syncope, etc... ~  EKG 12/10 w/ RBBB, otherw neg... ~  EKG 2/14 showed showed NSR, rate89, NSSTTWA, NAD.  VENOUS INSUFFICIENCY (ICD-459.81) - on low sodium diet, elevation, compression hose, etc... she has been followed since 2007 by DrKrusch et al at [facility] w/ surgery including endovenous chemical ablation & sclerotherapy in 2008, 2009, 2010, & 2011> she is very happy w/ her results & note from [facility] 11/11 is reviewed.  HYPERLIPIDEMIA (ICD-272.4) - on SIMVASTATIN 40mg /d & FENOFIBRATE 160mg /d... ~  FLP 6/08 showed TChol 213, TG 408, HDL 56, LDL 101... on Vytorin10/20, added Tricor145. ~  FLP 11/09 on Vytor10/20+Tric145 showed TChol 146, TG 75, HDL 52, LDL 79... changed to Simva40 + [id number] for $$ reasons... ~  FLP 12/10 on Simva40+Feno160 showed TChol 162, TG 85, HDL 54, LDL 91 ~  FLP 12/11 on Simva40+Feno160 showed TChol 149, TG 60, HDL 59, LDL 79 ~  FLP 2/13 on Simva40+Feno160 showed TChol 150, TG 85, HDL 54, LDL 79 ~  FLP 2/14 on Simva40+Feno160 showed TChol 149, TG 106, HDL 52, LDL 76 ~  FLP 12/14 on Simva40+Feno160 showed TChol 144, TG 103, HDL 43, LDL 80   OVERWEIGHT (ICD-278.02) - we discussed diet + exercise therapy... ~  peak weight = 220# in 2007 ~  weight 11/09 = 199# ~  weight  12/10 = 189#... Good job, keep it up! ~  weight 12/11 = 182# ~  Weight 2/13 = 200#... We reviewed need for diet/ exercise... ~  Weight 12/13 = 197# ~  Weight 2/14 = 202# ~  Weight 12/14 = 206#  GERD (ICD-530.81) - prev on PREVACID 30mg  Bid per DrBates for LER... ~  EGD in 1976 by DrSam showed mild gastritis... ~  12/10: she notes intermittent choking, dysphagia> EGD 4/11 by DrPerry was normal/ neg. ~  12/12: eval by DrBates for throat symptoms believed due to LER & placed on Prev30Bid... ~  2/14: she stopped the PPI on her own, take Align daily; reminded that the Prev30 was for her LPR, throat symptoms and cough...  DIVERTICULOSIS OF COLON (ICD-562.10), & COLONIC POLYPS (ICD-211.3) ~  colonoscopy 12/05 by DrPerry showed divertics and 2-50mm polyps= adenomatous and hyperplastic... ~  8/09: bout of diverticulitis treated by DrPerry... ~  colonoscopy 4/11 by DrPerry showed mild sigm divertics, diminutive polyp= polypoid mucosa only... ~  2013:  She had thorough eval by DrPerry for diarrhea up to 10-12/d & dx w/ "post infectious IBS" she says; now improved on ALIGN daily... ~  Colonoscopy 9/13 by DrPerry showed norm mucosa at the TI, mild divertics in sigm, otherw neg & f/u suggested in 5 yrs due to hx of adenoma...  FATTY LIVER DISEASE (ICD-571.8) - she has mild elev LFTs & instructed on low fat diet, wt reduction strategy. ~  LFTs 11/09 (wt=191#) showed AlkPhos=43, SGOT=28, SGPT=31 ~  LFTs 12/10 (wt=189#) showed AlkPhos=47, SGOT=36, SGPT=38 ~  CT Chest 1/11 showed fatty liver as an incidental finding. ~  LFTs 12/11 (wt=182#) showed AlkPhos=44, SGOT=52, SGPT=44 ~  LFTs 2/13 (wt=200#) showed AlkPhos= 53, SGOT=48, SGPT=55... Must get weight down! ~  2/14: she reports that her younger sister had a liver transplant for cirrhosis- she indicates it was sudden onset ?related to meds for back pain? Morgan [name] has elev LFTs prob due to fatty liver dis & we reviewed the necessity for diet/ exercise/ wt  reduction.  ~  Abd Sonar 2/14 showed norm GB, diffuse incr in liver echotexture c/w fatty liver dis, otherw neg abd sonar...  Hx of CYSTITIS (  ICD-595.9) - Urology eval 11/08 by ZOXWRUEA for chr cystitis...  Hx of NECK PAIN (ICD-723.1) - eval 11/09 w/ XRays showing some spondylosis & Rx w/ Tramadol, Rest, Heat, Tylenol...  CONTACT DERMATITIS DUE TO POISON IVY (ICD-692.6)  LYMPHOCYTOSIS (ICD-288.8) - CBC's show a mononuclear lymphocytosis of ? Signif... THROMBOCYTOPENIA >> she noted some minor bruising a a few nosebleeds in 2014... ~  CBC 2001 w/ WBC= 7.4 & 15% lymphs, 8% monos... ~  CBC 10/07 showed WBC= 7.6 w/ 23% lymphs, 7% monos... ~  CBC 2/08 showed WBC= 7.8 w/ 23% lymphs, 7% monos... ~  CBC 5/09 showed WBC= 8.7 w/ 20% lymphs, 19% monos... ~  CBC 11/09 showed WBC= 5.5 w/ 34% lymphs, 24% monos... repeat= 21% monos and pathologist review (DrHessling)= "normal RBC, WBC, plat morphology... normal peripheral blood smear"... ~  CBC 12/10 showed WBC= 5.5, Lymphs= 35%, Monos= 25% ~  CBC 12/11 showed WBC= 6.3, Lymph=29%, Mono=29% ~  CBC 2/13 showed WBC= 8.2, Lymph=32%, Mono=27%... Smear reviewed by path> no abn cells... ~  CBC 2/14 showed WBC=7.6, Lymphs=27%, Mono=28%, Plat=145K ~  CBC 12/14 showed WBC=11.8, Lymphs=16%, Monos=27%, Plat=117K... wer will refer her to Heme for their eval...  Health Maintenance:   she is under some stress related to her husb smoking. ~  GI:  DrPerry & up to date on colon screening... ~  GYN= DrMcPhail on Estrdiol 0.5, +calcium, MVI... she gets PAP, Mammograms, etc... ~  Immunizations:  she gets the yearly seasonal Flu vaccine... Pneumovax due age 1... ?last Tetanus shot.   Past Surgical History  Procedure Laterality Date  . Total abdominal hysterectomy  12/86  . Inner ear surgery      childhood; right  . Nasal septum surgery    . Foot surgery      left  . Chemical and laser endovenous ablation of le veins  2008, 2009, 2010, 2011    Dr Verl Blalock al     Outpatient Encounter Prescriptions as of 11/23/2013  Medication Sig  . aspirin 81 MG tablet Take 81 mg by mouth daily. Takes 4 times weekly due to bruising  . calcium carbonate (OS-CAL) 600 MG TABS Take 2 tablets by mouth daily.    . fenofibrate 160 MG tablet Take 1 tablet (160 mg total) by mouth daily.  . fexofenadine (ALLEGRA) 180 MG tablet Take 180 mg by mouth daily.  . Multiple Vitamin (MULTIVITAMIN) tablet Take 1 tablet by mouth daily.    . Nutritional Supplements (OSTEO ADVANCE) TABS Take 1 tablet by mouth daily.  . Probiotic Product (PROBIOTIC PO) Take by mouth daily.  . simvastatin (ZOCOR) 40 MG tablet Take 1 tablet (40 mg total) by mouth at bedtime.  Marland Kitchen telmisartan-hydrochlorothiazide (MICARDIS HCT) 40-12.5 MG per tablet Take 1 tablet by mouth daily.    No Known Allergies   Current Medications, Allergies, Past Medical History, Past Surgical History, Family History, and Social History were reviewed in [employer] record.   Review of Systems         The patient complains of nocturia, arthritis, and anxiety.  The patient denies fever, chills, sweats, anorexia, fatigue, weakness, malaise, weight loss, sleep disorder, blurring, diplopia, eye irritation, eye discharge, vision loss, eye pain, photophobia, earache, ear discharge, tinnitus, decreased hearing, nasal congestion, nosebleeds, sore throat, hoarseness, chest pain, palpitations, syncope, dyspnea on exertion, orthopnea, PND, peripheral edema, cough, dyspnea at rest, excessive sputum, hemoptysis, wheezing, pleurisy, nausea, vomiting, diarrhea, constipation, change in bowel habits, abdominal pain, melena, hematochezia, jaundice, gas/bloating, indigestion/heartburn, dysphagia, odynophagia,  dysuria, hematuria, urinary frequency, urinary hesitancy, incontinence, back pain, joint pain, joint swelling, muscle cramps, muscle weakness, stiffness, sciatica, restless legs, leg pain at night, leg pain with exertion,  rash, itching, dryness, suspicious lesions, paralysis, paresthesias, seizures, tremors, vertigo, transient blindness, frequent falls, frequent headaches, difficulty walking, depression, memory loss, confusion, cold intolerance, heat intolerance, polydipsia, polyphagia, polyuria, unusual weight change, abnormal bruising, bleeding, enlarged lymph nodes, urticaria, allergic rash, hay fever, and recurrent infections.     Objective:   Physical Exam     WD, Overweight, 62 y/o WF in NAD... GENERAL:  Alert & oriented; pleasant & cooperative... HEENT:  [place]/AT, EOM-wnl, PERRLA, EACs-clear, TMs-wnl, NOSE-clear, THROAT-clear & wnl. NECK:  Supple w/ fairROM; no JVD; normal carotid impulses w/o bruits; no thyromegaly or nodules palpated; no lymphadenopathy. CHEST:  Clear to P & A; without wheezes/ rales/ or rhonchi. HEART:  Regular Rhythm; without murmurs/ rubs/ or gallops. ABDOMEN:  Soft & nontender, +panniculus; normal bowel sounds; no organomegaly or masses detected. EXT: without deformities, mild arthritic changes; varicose veins/ +venous insuffic/ tr edema. NEURO:  CN's intact; motor testing normal; sensory testing normal; gait normal & balance OK. DERM:  No lesions noted; no rash etc...  RADIOLOGY DATA:  Reviewed in the EPIC EMR & discussed w/ the patient...     LABORATORY DATA:  Reviewed in the EPIC EMR & discussed w/ the patient...     Assessment:     AR & Laryngitis>  Eval & Rx by DrBates; on MMW, PPI Bid (she stopped & advised to restart), etc; she has maintained f/u w/ ENT...  Hx Abn CXR>  Small RUL granuloma & bilat apic pleural thickening; no acute changes...  HBP>  Controlled on the MicardisHCT; advised low sodium & get wt down!!!  Ven Insuffic>  Followed by [facility]Featherston at [facility]...  Hyperlipid>  On Simva40 + Feno160 & FLP looks great; needs better diet & wt reduction...  Overweight>  Wt up to 206# & she knows the drill: count calories, eat less, exercise, burn more...  GI>  GERD, Divertics, Polyps, FLD>  Continue PPI Rx and needs to lose wt to improve the LFTs; "post infectious IBS" improved w/ Align; must lose wt to improve LFTs...  LYMPHOCYTOSIS>  CBC w/ mononuclear lymphocytosis, no change, smear reviewed by pathologist- ok no abn cells; but now mild thrombocytopenia & had epistaxis- refer to Heme...Marland KitchenMarland KitchenMarland Kitchen     Plan:     Patient's Medications  New Prescriptions   No medications on file  Previous Medications   ASPIRIN 81 MG TABLET    Take 81 mg by mouth daily. Takes 4 times weekly due to bruising   CALCIUM CARBONATE (OS-CAL) 600 MG TABS    Take 2 tablets by mouth daily.     FEXOFENADINE (ALLEGRA) 180 MG TABLET    Take 180 mg by mouth daily.   MULTIPLE VITAMIN (MULTIVITAMIN) TABLET    Take 1 tablet by mouth daily.     NUTRITIONAL SUPPLEMENTS (OSTEO ADVANCE) TABS    Take 1 tablet by mouth daily.   PROBIOTIC PRODUCT (PROBIOTIC PO)    Take by mouth daily.  Modified Medications   Modified Medication Previous Medication   FENOFIBRATE 160 MG TABLET fenofibrate 160 MG tablet      Take 1 tablet (160 mg total) by mouth daily.    Take 1 tablet (160 mg total) by mouth daily.   SIMVASTATIN (ZOCOR) 40 MG TABLET simvastatin (ZOCOR) 40 MG tablet      Take 1 tablet (40 mg total) by  mouth at bedtime.    Take 1 tablet (40 mg total) by mouth at bedtime.   TELMISARTAN-HYDROCHLOROTHIAZIDE (MICARDIS HCT) 40-12.5 MG PER TABLET telmisartan-hydrochlorothiazide (MICARDIS HCT) 40-12.5 MG per tablet      Take 1 tablet by mouth daily.    Take 1 tablet by mouth daily.  Discontinued Medications   No medications on file

## 2013-11-24 ENCOUNTER — Telehealth: Payer: Self-pay | Admitting: Oncology

## 2013-11-24 NOTE — Telephone Encounter (Signed)
C/D 11/24/13 for appt. 12/09/13 °

## 2013-12-04 ENCOUNTER — Other Ambulatory Visit: Payer: Self-pay | Admitting: Oncology

## 2013-12-04 DIAGNOSIS — D473 Essential (hemorrhagic) thrombocythemia: Secondary | ICD-10-CM

## 2013-12-04 DIAGNOSIS — D75839 Thrombocytosis, unspecified: Secondary | ICD-10-CM

## 2013-12-09 ENCOUNTER — Encounter: Payer: Self-pay | Admitting: Oncology

## 2013-12-09 ENCOUNTER — Ambulatory Visit (HOSPITAL_BASED_OUTPATIENT_CLINIC_OR_DEPARTMENT_OTHER): Payer: Managed Care, Other (non HMO) | Admitting: Oncology

## 2013-12-09 ENCOUNTER — Telehealth: Payer: Self-pay | Admitting: Oncology

## 2013-12-09 ENCOUNTER — Other Ambulatory Visit: Payer: Self-pay | Admitting: Oncology

## 2013-12-09 ENCOUNTER — Other Ambulatory Visit (HOSPITAL_BASED_OUTPATIENT_CLINIC_OR_DEPARTMENT_OTHER): Payer: Managed Care, Other (non HMO)

## 2013-12-09 ENCOUNTER — Ambulatory Visit (HOSPITAL_BASED_OUTPATIENT_CLINIC_OR_DEPARTMENT_OTHER): Payer: Managed Care, Other (non HMO)

## 2013-12-09 ENCOUNTER — Other Ambulatory Visit (HOSPITAL_COMMUNITY)
Admission: RE | Admit: 2013-12-09 | Discharge: 2013-12-09 | Disposition: A | Discharge status: Home / Self Care | Payer: Managed Care, Other (non HMO) | Source: Ambulatory Visit | Attending: Oncology | Admitting: Oncology

## 2013-12-09 VITALS — BP 174/78 | HR 68 | Temp 97.9°F | Resp 18 | Ht 64.0 in | Wt 207.0 lb

## 2013-12-09 DIAGNOSIS — D473 Essential (hemorrhagic) thrombocythemia: Secondary | ICD-10-CM

## 2013-12-09 DIAGNOSIS — D75839 Thrombocytosis, unspecified: Secondary | ICD-10-CM

## 2013-12-09 DIAGNOSIS — D696 Thrombocytopenia, unspecified: Secondary | ICD-10-CM

## 2013-12-09 DIAGNOSIS — D72821 Monocytosis (symptomatic): Secondary | ICD-10-CM

## 2013-12-09 DIAGNOSIS — D72829 Elevated white blood cell count, unspecified: Secondary | ICD-10-CM | POA: Insufficient documentation

## 2013-12-09 LAB — COMPREHENSIVE METABOLIC PANEL (CC13)
ALT: 80 U/L — ABNORMAL HIGH (ref 0–55)
AST: 59 U/L — AB (ref 5–34)
Albumin: 3.9 g/dL (ref 3.5–5.0)
Alkaline Phosphatase: 106 U/L (ref 40–150)
Anion Gap: 8 mEq/L (ref 3–11)
BUN: 22.5 mg/dL (ref 7.0–26.0)
CHLORIDE: 105 meq/L (ref 98–109)
CO2: 28 meq/L (ref 22–29)
CREATININE: 0.8 mg/dL (ref 0.6–1.1)
Calcium: 10 mg/dL (ref 8.4–10.4)
Glucose: 97 mg/dl (ref 70–140)
Potassium: 4.3 mEq/L (ref 3.5–5.1)
Sodium: 141 mEq/L (ref 136–145)
Total Bilirubin: 0.43 mg/dL (ref 0.20–1.20)
Total Protein: 7.3 g/dL (ref 6.4–8.3)

## 2013-12-09 LAB — CBC WITH DIFFERENTIAL/PLATELET
HCT: 35.6 % (ref 34.8–46.6)
HGB: 12 g/dL (ref 11.6–15.9)
MCH: 30.7 pg (ref 25.1–34.0)
MCHC: 33.7 g/dL (ref 31.5–36.0)
MCV: 91.3 fL (ref 79.5–101.0)
PLATELETS: 124 10*3/uL — AB (ref 145–400)
RBC: 3.89 10*6/uL (ref 3.70–5.45)
RDW: 14.4 % (ref 11.2–14.5)
WBC: 16.6 10*3/uL — ABNORMAL HIGH (ref 3.9–10.3)

## 2013-12-09 LAB — MANUAL DIFFERENTIAL
ALC: 2.7 10*3/uL (ref 0.9–3.3)
ANC (CHCC MAN DIFF): 10.6 10*3/uL — AB (ref 1.5–6.5)
Band Neutrophils: 3 % (ref 0–10)
Basophil: 0 % (ref 0–2)
Blasts: 0 % (ref 0–0)
EOS: 0 % (ref 0–7)
LYMPH: 16 % (ref 14–49)
MONO: 20 % — ABNORMAL HIGH (ref 0–14)
MYELOCYTES: 1 % — AB (ref 0–0)
Metamyelocytes: 1 % — ABNORMAL HIGH (ref 0–0)
Other Cell: 0 % (ref 0–0)
PLT EST: DECREASED
PROMYELO: 0 % (ref 0–0)
SEG: 59 % (ref 38–77)
VARIANT LYMPH: 0 % (ref 0–0)
nRBC: 0 % (ref 0–0)

## 2013-12-09 LAB — CHCC SMEAR

## 2013-12-09 NOTE — Consult Note (Signed)
Reason for Referral: Thrombocytopenia and monocytosis.   HPI: This is a pleasant 63 year old [place] woman referred to me for the evaluation of thrombocytopenia. She is a rather healthy woman with a history of hypertension and hyperlipidemia without any significant comorbid conditions. She was noted in the last year or so increase in the frequency of her bruising and was asked to decrease her dose of aspirin 81 mg to 4 or 5 days per week due to that. She also developed epistaxis in December of 2014 and a repeat CBC showed that her platelet count was 117. She was also noted to have mild leukocytosis and monocytosis although differential. She did not report any acute illness at that time she did not have any flulike symptoms or fevers. He did not have any signs of infection or hospitalizations or illnesses. Looking back historically at her counts, she had normal platelets at least dating back to 2009. She had also monocytosis dating back to 2009 with a white cell count that have been normal. She denied any new medication or over-the-counter medication or any supplements. Overall, she does not report any constitutional symptoms. She is not reporting any fevers or chills or sweats. She has not reported any change in her appetite or weight loss. She still have excellent energy and perform work related duties without any complications.   Past Medical History  Diagnosis Date  . Hearing loss   . Allergic rhinitis   . Abnormal chest x-ray   . Abnormal electrocardiogram   . Venous insufficiency   . Hyperlipidemia   . Overweight(278.02)   . GERD (gastroesophageal reflux disease)   . Diverticulosis of colon   . Colon polyp     polypoid colorectal mucosa/adenomatous  . Fatty liver disease, nonalcoholic   . Cystitis   . Contact dermatitis due to poison ivy   . Lymphocytosis   . Hemorrhoids   :  Past Surgical History  Procedure Laterality Date  . Total abdominal hysterectomy  12/86  . Inner ear  surgery      childhood; right  . Nasal septum surgery    . Foot surgery      left  . Chemical and laser endovenous ablation of le veins  2008, 2009, 2010, 2011    Dr Eilleen Kempf et al  :  Current Outpatient Prescriptions  Medication Sig Dispense Refill  . aspirin 81 MG tablet Take 81 mg by mouth daily. Takes 4 times weekly due to bruising      . calcium carbonate (OS-CAL) 600 MG TABS Take 2 tablets by mouth daily.        . fenofibrate 160 MG tablet Take 1 tablet (160 mg total) by mouth daily.  90 tablet  3  . fexofenadine (ALLEGRA) 180 MG tablet Take 180 mg by mouth daily.      . Multiple Vitamin (MULTIVITAMIN) tablet Take 1 tablet by mouth daily.        . Nutritional Supplements (OSTEO ADVANCE) TABS Take 1 tablet by mouth daily.      . Probiotic Product (PROBIOTIC PO) Take by mouth daily.      . simvastatin (ZOCOR) 40 MG tablet Take 1 tablet (40 mg total) by mouth at bedtime.  90 tablet  3  . telmisartan-hydrochlorothiazide (MICARDIS HCT) 40-12.5 MG per tablet Take 1 tablet by mouth daily.  90 tablet  3   No current facility-administered medications for this visit.       No Known Allergies:  Family History  Problem Relation Age of Onset  .  Heart disease Sister     sister #1  . Breast cancer Other     half-sister  . Hyperlipidemia Mother   . Hypertension Mother   . Liver disease Sister     sister #1 - hx of liver transplant 14+ yrs ago  . Healthy Sister     sister #2  . Colon cancer Neg Hx   . Diabetes Maternal Uncle   . Heart failure Brother   . Diabetes Maternal Aunt     x 2  . Heart failure Son   :  History   Social History  . Marital Status: Married    Spouse Name: [name]    Number of Children: 4  . Years of Education: N/A   Occupational History  . 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 Smoking status: Former Smoker -- 0.50 packs/day for 15 years    Types: Cigarettes    Quit date: 12/03/2001  . Smokeless tobacco: Never Used  .  Alcohol Use: Yes     Comment: occasional  . Drug Use: No  . Sexual Activity: Not on file   Other Topics Concern  . Not on file   Social History Narrative  . No narrative on file  :  Constitutional: negative for anorexia, fatigue, malaise, night sweats and weight loss Eyes: negative for icterus, irritation and visual disturbance Ears, nose, mouth, throat, and face: negative for ear drainage, hearing loss, snoring and but reports sinus congestion.  Respiratory: negative for asthma, pneumonia and wheezing Cardiovascular: negative for chest pain, dyspnea, exertional chest pressure/discomfort and palpitations Gastrointestinal: negative except for abdominal pain, change in bowel habits, diarrhea, melena and vomiting Genitourinary:negative for dysuria, hematuria and nocturia Integument/breast: negative for pruritus and rash Hematologic/lymphatic: negative for lymphadenopathy and petechiae Musculoskeletal:negative for arthralgias, bone pain and myalgias Neurological: negative for coordination problems, gait problems and headaches Behavioral/Psych: negative for anxiety and depression Endocrine: negative for temperature intolerance Allergic/Immunologic: negative for anaphylaxis  Exam: Blood pressure 174/78, pulse 68, temperature 97.9 F (36.6 C), temperature source Oral, resp. rate 18, height _0  (1.626 m), weight 207 lb (93.895 kg), SpO2 100.00%. General appearance: alert, cooperative and appears stated age Head: Normocephalic, without obvious abnormality, atraumatic Nose: Nares normal. Septum midline. Mucosa normal. No drainage or sinus tenderness. Throat: lips, mucosa, and tongue normal; teeth and gums normal Neck: no adenopathy, no carotid bruit, no JVD, supple, symmetrical, trachea midline and thyroid not enlarged, symmetric, no tenderness/mass/nodules Back: symmetric, no curvature. ROM normal. No CVA tenderness. Resp: clear to auscultation bilaterally Chest wall: no  tenderness Cardio: regular rate and rhythm, S1, S2 normal, no murmur, click, rub or gallop GI: soft, non-tender; bowel sounds normal; no masses,  no organomegaly Extremities: extremities normal, atraumatic, no cyanosis or edema Pulses: 2+ and symmetric Skin: Skin color, texture, turgor normal. No rashes or lesions Lymph nodes: Cervical, supraclavicular, and axillary nodes normal. Neurologic: Grossly normal   Recent Labs  12/09/13 1037  WBC 16.6*  HGB 12.0  HCT 35.6  PLT 124*    Recent Labs  12/09/13 1038  NA 141  K 4.3  CO2 28  GLUCOSE 97  BUN 22.5  CREATININE 0.8  CALCIUM 10.0     Blood smear review:  The peripheral smear was personally reviewed today. It showed clear evidence of monocytosis with very rare immature cells. There is no evidence of blasts. Red cells appeared normal without any red cell fragments or schistocytosis. Platelets appeared adequate in size and number.  Assessment and Plan:   63 year old woman with the following issues:  1. Thrombocytopenia: The differential diagnosis was discussed today in detail with the patient and her daughter. Thrombocytopenia is rather mild with a platelet count of 124,000. Etiologies would include medication, autoimmune and less likely due to myelodysplastic/myeloproliferative disorder. Other conditions such as acute leukemia, lymphoma HUS, TTP, DIC and HIV are exceptionally unlikely. The management standpoint, she has no active bleeding at this time with a platelet count is very adequate to sustain most procedures.I advocate active surveillance and repeat counts in about 2-3 months.  2. Monocytosis: Differential diagnosis was discussed today with the patient and her daughter. This would include reactive process due to illnesses or chronic inflammatory conditions. Could also be a sign of early myelodysplasia or conditions such as chronic myelomonocytic leukemia. For the time being, I will continue to observe these findings and  I will send her peripheral blood for flow cytometry to rule out any immature or blastic cells. This findings persist, I might have to recommend a bone marrow biopsy for further investigation of her hematological condition.  All her questions were answered and we'll arrange for followup in about 2-3 months.

## 2013-12-09 NOTE — Telephone Encounter (Signed)
appts made per 1/7 POF AVS and CAL given shh °

## 2013-12-09 NOTE — Progress Notes (Signed)
Please see consult note.  

## 2013-12-09 NOTE — Progress Notes (Signed)
Checked in new patient with no financial issues and she has appt crd.

## 2013-12-11 ENCOUNTER — Encounter: Payer: Self-pay | Admitting: Oncology

## 2013-12-14 LAB — FLOW CYTOMETRY

## 2014-02-16 ENCOUNTER — Other Ambulatory Visit (HOSPITAL_BASED_OUTPATIENT_CLINIC_OR_DEPARTMENT_OTHER): Payer: Managed Care, Other (non HMO)

## 2014-02-16 ENCOUNTER — Ambulatory Visit (HOSPITAL_BASED_OUTPATIENT_CLINIC_OR_DEPARTMENT_OTHER): Payer: Managed Care, Other (non HMO) | Admitting: Oncology

## 2014-02-16 ENCOUNTER — Encounter: Payer: Self-pay | Admitting: Oncology

## 2014-02-16 ENCOUNTER — Telehealth: Payer: Self-pay | Admitting: Oncology

## 2014-02-16 VITALS — BP 154/71 | HR 65 | Temp 98.4°F | Resp 18 | Wt 209.1 lb

## 2014-02-16 DIAGNOSIS — D696 Thrombocytopenia, unspecified: Secondary | ICD-10-CM

## 2014-02-16 DIAGNOSIS — D72821 Monocytosis (symptomatic): Secondary | ICD-10-CM

## 2014-02-16 LAB — CBC WITH DIFFERENTIAL/PLATELET
BASO%: 0.6 % (ref 0.0–2.0)
BASOS ABS: 0.1 10*3/uL (ref 0.0–0.1)
EOS%: 0.3 % (ref 0.0–7.0)
Eosinophils Absolute: 0.1 10*3/uL (ref 0.0–0.5)
HCT: 35.2 % (ref 34.8–46.6)
HEMOGLOBIN: 11.9 g/dL (ref 11.6–15.9)
LYMPH#: 2.2 10*3/uL (ref 0.9–3.3)
LYMPH%: 13.7 % — ABNORMAL LOW (ref 14.0–49.7)
MCH: 30.6 pg (ref 25.1–34.0)
MCHC: 33.9 g/dL (ref 31.5–36.0)
MCV: 90.3 fL (ref 79.5–101.0)
MONO#: 4.1 10*3/uL — ABNORMAL HIGH (ref 0.1–0.9)
MONO%: 26.2 % — ABNORMAL HIGH (ref 0.0–14.0)
NEUT#: 9.4 10*3/uL — ABNORMAL HIGH (ref 1.5–6.5)
NEUT%: 59.2 % (ref 38.4–76.8)
Platelets: 114 10*3/uL — ABNORMAL LOW (ref 145–400)
RBC: 3.9 10*6/uL (ref 3.70–5.45)
RDW: 14.6 % — ABNORMAL HIGH (ref 11.2–14.5)
WBC: 15.8 10*3/uL — AB (ref 3.9–10.3)

## 2014-02-16 LAB — COMPREHENSIVE METABOLIC PANEL (CC13)
ALBUMIN: 4 g/dL (ref 3.5–5.0)
ALT: 68 U/L — ABNORMAL HIGH (ref 0–55)
AST: 53 U/L — AB (ref 5–34)
Alkaline Phosphatase: 87 U/L (ref 40–150)
Anion Gap: 12 mEq/L — ABNORMAL HIGH (ref 3–11)
BUN: 27.6 mg/dL — AB (ref 7.0–26.0)
CO2: 24 mEq/L (ref 22–29)
Calcium: 9.9 mg/dL (ref 8.4–10.4)
Chloride: 106 mEq/L (ref 98–109)
Creatinine: 0.9 mg/dL (ref 0.6–1.1)
GLUCOSE: 107 mg/dL (ref 70–140)
POTASSIUM: 4.3 meq/L (ref 3.5–5.1)
Sodium: 142 mEq/L (ref 136–145)
TOTAL PROTEIN: 7.1 g/dL (ref 6.4–8.3)
Total Bilirubin: 0.36 mg/dL (ref 0.20–1.20)

## 2014-02-16 LAB — TECHNOLOGIST REVIEW: Technologist Review: 1

## 2014-02-16 LAB — CHCC SMEAR

## 2014-02-16 NOTE — Progress Notes (Signed)
Hematology and Oncology Follow Up Visit  Morgan [name] [id number] [birth year] 63 y.o. 02/16/2014 8:52 AM NADEL,Morgan [name], MDNadel, Morgan Medina, MD   Principle Diagnosis: 63 year old woman with thrombocytopenia and monocytosis. The differential diagnosis include myelodysplastic syndrome versus reactive findings. This was observed in January 2015.   Current therapy: Observation and surveillance.  Interim History:  This is a pleasant 63 year old woman seen in followup after my initial consultation in January of 2015. She was evaluated for the above findings. Since her last visit she is completely asymptomatic. She has not reported any fevers or chills or sweats. Is not reporting constitutional symptoms of weight loss or appetite changes. Has not reported any fatigue or tiredness. She has not had any recent hospitalizations or illnesses. She has not reported any abdominal pain or weight loss early satiety. She did not report any bleeding she did have one episode of hemorrhoidal bleed but was very limited. She has not reported any lymphadenopathy or genitourinary complaints.  Medications: I have reviewed the patient's current medications.  Current Outpatient Prescriptions  Medication Sig Dispense Refill  . aspirin 81 MG tablet Take 81 mg by mouth daily. Takes 3 times weekly due to bruising      . calcium carbonate (OS-CAL) 600 MG TABS Take 2 tablets by mouth daily.        . fenofibrate 160 MG tablet Take 1 tablet (160 mg total) by mouth daily.  90 tablet  3  . fexofenadine (ALLEGRA) 180 MG tablet Take 180 mg by mouth daily.      . Multiple Vitamin (MULTIVITAMIN) tablet Take 1 tablet by mouth daily.        . Nutritional Supplements (OSTEO ADVANCE) TABS Take 1 tablet by mouth daily.      . Probiotic Product (PROBIOTIC PO) Take by mouth daily.      . simvastatin (ZOCOR) 40 MG tablet Take 1 tablet (40 mg total) by mouth at bedtime.  90 tablet  3  . telmisartan-hydrochlorothiazide (MICARDIS HCT) 40-12.5 MG  per tablet Take 1 tablet by mouth daily.  90 tablet  3   No current facility-administered medications for this visit.     Allergies: No Known Allergies  Past Medical History, Surgical history, Social history, and Family History were reviewed and updated.  Review of Systems: Constitutional:  Negative for fever, chills, night sweats, anorexia, weight loss, pain. Cardiovascular: no chest pain or dyspnea on exertion Respiratory: no cough, shortness of breath, or wheezing Neurological: no TIA or stroke symptoms Dermatological: negative ENT: negative Skin: Negative. Gastrointestinal: no abdominal pain, change in bowel habits, or black or bloody stools Genito-Urinary: no dysuria, trouble voiding, or hematuria Hematological and Lymphatic: negative Breast: negative Musculoskeletal: negative Remaining ROS negative. Physical Exam: Blood pressure 154/71, pulse 65, temperature 98.4 F (36.9 C), temperature source Oral, resp. rate 18, weight 209 lb 2 oz (94.858 kg). ECOG:  General appearance: alert, cooperative and appears stated age Head: Normocephalic, without obvious abnormality, atraumatic Neck: no adenopathy, no carotid bruit, no JVD, supple, symmetrical, trachea midline and thyroid not enlarged, symmetric, no tenderness/mass/nodules Lymph nodes: Cervical, supraclavicular, and axillary nodes normal. Heart:regular rate and rhythm, S1, S2 normal, no murmur, click, rub or gallop Lung:chest clear, no wheezing, rales, normal symmetric air entry Abdomin: soft, non-tender, without masses or organomegaly EXT:no erythema, induration, or nodules   Lab Results: Lab Results  Component Value Date   WBC 15.8* 02/16/2014   HGB 11.9 02/16/2014   HCT 35.2 02/16/2014   MCV 90.3 02/16/2014   PLT 114*  02/16/2014     Chemistry      Component Value Date/Time   NA 141 12/09/2013 1038   NA 142 11/18/2013 0813   K 4.3 12/09/2013 1038   K 4.1 11/18/2013 0813   CL 104 11/18/2013 0813   CO2 28 12/09/2013 1038    CO2 27 11/18/2013 0813   BUN 22.5 12/09/2013 1038   BUN 22 11/18/2013 0813   CREATININE 0.8 12/09/2013 1038   CREATININE 0.98 11/18/2013 0813   CREATININE 0.9 01/13/2013 0842      Component Value Date/Time   CALCIUM 10.0 12/09/2013 1038   CALCIUM 9.8 11/18/2013 0813   ALKPHOS 106 12/09/2013 1038   ALKPHOS 74 11/18/2013 0813   AST 59* 12/09/2013 1038   AST 45* 11/18/2013 0813   ALT 80* 12/09/2013 1038   ALT 54* 11/18/2013 0813   BILITOT 0.43 12/09/2013 1038   BILITOT 0.7 11/18/2013 0813       Impression and Plan:  63 year old woman with the following issues:  1. Monocytosis: Differential diagnosis including myelodysplastic syndrome such as chronic myelomonocytic leukemia as well as possible inflammatory conditions. I discussed treatment approaches at this time as well as diagnostic strategies. I feel that she needs a bone marrow biopsy to differentiate and diagnose these conditions that her overall white cell count him been relatively stable and no other hematological abnormalities noted. Alternatively, we can observe her closely and do a bone marrow biopsy if her counts changed her medically. She elected to proceed with that route and we'll continue to monitor her for the time being. She is to report to me any changes in her clinical status.   2. Thrombocytopenia: No active bleeding noted and her platelet counts is relatively stable. Continue to monitor this as I feel is probably part of her hematological condition such as MDS.  [facility], MD 3/17/20158:52 AM

## 2014-02-16 NOTE — Telephone Encounter (Signed)
gv and pritned appt sched and avs for pt for July

## 2014-02-17 ENCOUNTER — Telehealth: Payer: Self-pay | Admitting: Medical Oncology

## 2014-02-17 ENCOUNTER — Encounter: Payer: Self-pay | Admitting: Pulmonary Disease

## 2014-02-17 NOTE — Telephone Encounter (Signed)
F/U call to patient's email to Dr. Alen Blew regarding being tested for RA. Informed patient, per MD, that it would be a good idea for her PCP to test her for RA, can have it done at her next physical or sooner if she prefers. Patient expressed verbal understanding. Denies questions at this time. Encouraged her to call office with any questions or concerns.

## 2014-02-22 ENCOUNTER — Other Ambulatory Visit: Payer: Self-pay | Admitting: Pulmonary Disease

## 2014-02-22 DIAGNOSIS — M2560 Stiffness of unspecified joint, not elsewhere classified: Secondary | ICD-10-CM

## 2014-02-24 NOTE — Telephone Encounter (Signed)
Morgan [name] spoke with Morgan [name]

## 2014-03-02 ENCOUNTER — Ambulatory Visit (INDEPENDENT_AMBULATORY_CARE_PROVIDER_SITE_OTHER): Payer: Managed Care, Other (non HMO)

## 2014-03-02 ENCOUNTER — Telehealth: Payer: Self-pay | Admitting: Pulmonary Disease

## 2014-03-02 DIAGNOSIS — D696 Thrombocytopenia, unspecified: Secondary | ICD-10-CM

## 2014-03-02 DIAGNOSIS — M2560 Stiffness of unspecified joint, not elsewhere classified: Secondary | ICD-10-CM

## 2014-03-02 LAB — RHEUMATOID FACTOR: Rhuematoid fact SerPl-aCnc: <10 IU/mL (ref <=14)

## 2014-03-02 LAB — SEDIMENTATION RATE: Sed Rate: 23 mm/hr — ABNORMAL HIGH (ref 0–22)

## 2014-03-02 NOTE — Telephone Encounter (Signed)
Called and spoke with pt. Made her aware we did not order a "smear" test on her. She voiced her understanding and needed nothing further

## 2014-03-03 ENCOUNTER — Other Ambulatory Visit: Payer: Self-pay | Admitting: Medical Oncology

## 2014-03-03 DIAGNOSIS — D696 Thrombocytopenia, unspecified: Secondary | ICD-10-CM

## 2014-03-03 LAB — ANA: ANA: POSITIVE — AB

## 2014-03-03 LAB — ANTI-NUCLEAR AB-TITER (ANA TITER): ANA Titer 1: NEGATIVE

## 2014-03-03 LAB — CYCLIC CITRUL PEPTIDE ANTIBODY, IGG: Cyclic Citrullin Peptide Ab: <2 U/mL (ref 0.0–5.0)

## 2014-05-18 ENCOUNTER — Encounter (HOSPITAL_COMMUNITY): Payer: Self-pay | Admitting: Emergency Medicine

## 2014-05-18 ENCOUNTER — Emergency Department (INDEPENDENT_AMBULATORY_CARE_PROVIDER_SITE_OTHER)
Admission: EM | Admit: 2014-05-18 | Discharge: 2014-05-18 | Disposition: A | Discharge status: Home / Self Care | Payer: Managed Care, Other (non HMO) | Source: Home / Self Care

## 2014-05-18 DIAGNOSIS — H6593 Unspecified nonsuppurative otitis media, bilateral: Secondary | ICD-10-CM

## 2014-05-18 DIAGNOSIS — J069 Acute upper respiratory infection, unspecified: Secondary | ICD-10-CM

## 2014-05-18 DIAGNOSIS — H6590 Unspecified nonsuppurative otitis media, unspecified ear: Secondary | ICD-10-CM

## 2014-05-18 MED ORDER — AMOXICILLIN 500 MG PO CAPS: 1000.0000 mg | ORAL_CAPSULE | Freq: Two times a day (BID) | ORAL | Status: DC

## 2014-05-18 NOTE — ED Provider Notes (Signed)
CSN: [id number]     Arrival date & time 05/18/14  4270 History   First MD Initiated Contact with Patient 05/18/14 1023     Chief Complaint  Patient presents with  . URI   (Consider location/radiation/quality/duration/timing/severity/associated sxs/prior Treatment) HPI Comments: 63 year old female complaining of nasal congestion, nasal stuffiness, ears feeling clogged, PND, laryngitis and low-grade fever for the past 4-5 days. She also complains of right earache.   Past Medical History  Diagnosis Date  . Hearing loss   . Allergic rhinitis   . Abnormal chest x-ray   . Abnormal electrocardiogram   . Venous insufficiency   . Hyperlipidemia   . Overweight   . GERD (gastroesophageal reflux disease)   . Diverticulosis of colon   . Colon polyp     polypoid colorectal mucosa/adenomatous  . Fatty liver disease, nonalcoholic   . Cystitis   . Contact dermatitis due to poison ivy   . Lymphocytosis   . Hemorrhoids    Past Surgical History  Procedure Laterality Date  . Total abdominal hysterectomy  12/86  . Inner ear surgery      childhood; right  . Nasal septum surgery    . Foot surgery      left  . Chemical and laser endovenous ablation of le veins  2008, 2009, 2010, 2011    Dr Eilleen Kempf et al   Family History  Problem Relation Age of Onset  . Heart disease Sister     sister #1  . Breast cancer Other     half-sister  . Hyperlipidemia Mother   . Hypertension Mother   . Liver disease Sister     sister #1 - hx of liver transplant 14+ yrs ago  . Healthy Sister     sister #2  . Colon cancer Neg Hx   . Diabetes Maternal Uncle   . Heart failure Brother   . Diabetes Maternal Aunt     x 2  . Heart failure Son    History  Substance Use Topics  . Smoking status: Former Smoker -- 0.50 packs/day for 15 years    Types: Cigarettes    Quit date: 12/03/2001  . Smokeless tobacco: Never Used  . Alcohol Use: Yes     Comment: occasional   OB History   Grav Para Term Preterm Abortions  TAB SAB Ect Mult Living                 Review of Systems  Constitutional: Positive for fever and activity change. Negative for chills, appetite change and fatigue.  HENT: Positive for congestion, ear pain, postnasal drip, rhinorrhea, sinus pressure and voice change. Negative for ear discharge, facial swelling and sore throat.   Eyes: Negative.   Respiratory: Positive for cough. Negative for chest tightness, shortness of breath, wheezing and stridor.   Cardiovascular: Negative.  Negative for chest pain and palpitations.  Gastrointestinal: Negative.   Genitourinary: Negative.   Musculoskeletal: Negative for myalgias, neck pain and neck stiffness.  Skin: Negative for pallor and rash.  Neurological: Negative.   Psychiatric/Behavioral: Negative.     Allergies  Review of patient's allergies indicates no known allergies.  Home Medications   Prior to Admission medications   Medication Sig Start Date End Date Taking? Authorizing Provider  aspirin 81 MG tablet Take 81 mg by mouth daily. Takes 3 times weekly due to bruising   Yes Historical Provider, MD  fenofibrate 160 MG tablet Take 1 tablet (160 mg total) by mouth daily. 11/23/13  Yes Scott  Roddie Mc, MD  simvastatin (ZOCOR) 40 MG tablet Take 1 tablet (40 mg total) by mouth at bedtime. 11/23/13  Yes Noralee Space, MD  telmisartan-hydrochlorothiazide (MICARDIS HCT) 40-12.5 MG per tablet Take 1 tablet by mouth daily. 11/23/13  Yes Noralee Space, MD  amoxicillin (AMOXIL) 500 MG capsule Take 2 capsules (1,000 mg total) by mouth 2 (two) times daily. 05/18/14   Janne Napoleon, NP  calcium carbonate (OS-CAL) 600 MG TABS Take 2 tablets by mouth daily.      Historical Provider, MD  fexofenadine (ALLEGRA) 180 MG tablet Take 180 mg by mouth daily.    Historical Provider, MD  Multiple Vitamin (MULTIVITAMIN) tablet Take 1 tablet by mouth daily.      Historical Provider, MD  Nutritional Supplements (OSTEO ADVANCE) TABS Take 1 tablet by mouth daily.     Historical Provider, MD  Probiotic Product (PROBIOTIC PO) Take by mouth daily.    Historical Provider, MD   BP 144/82  Pulse 110  Temp(Src) 99 F (37.2 C) (Oral)  Resp 18  SpO2 100% Physical Exam  Nursing note and vitals reviewed. Constitutional: She is oriented to person, place, and time. She appears well-developed and well-nourished. No distress.  HENT:  Mouth/Throat: Oropharynx is clear and moist. No oropharyngeal exudate.  Right TM with a solitary oval fenestration. Patient states this has been there since childhood. Anterior third of the TM is deeply erythematous. No drainage from the ruptured TM. Left TM with deep erythema and retraction.  OP with minimal redness. No swelling or exudates.  Eyes: Conjunctivae and EOM are normal. Pupils are equal, round, and reactive to light.  Neck: Normal range of motion. Neck supple.  Cardiovascular: Normal rate, regular rhythm and normal heart sounds.   Pulmonary/Chest: Effort normal and breath sounds normal. No respiratory distress. She has no wheezes. She has no rales.  Musculoskeletal: Normal range of motion. She exhibits no edema.  Lymphadenopathy:    She has no cervical adenopathy.  Neurological: She is alert and oriented to person, place, and time.  Skin: Skin is warm and dry. No rash noted.  Psychiatric: She has a normal mood and affect.    ED Course  Procedures (including critical care time) Labs Review Labs Reviewed - No data to display  Imaging Review No results found.   MDM   1. URI (upper respiratory infection)   2. Bilateral non-suppurative otitis media     Amoxicillin x 10 d OTC meds for sx's written on instructions and discussed with pt.    Janne Napoleon, NP 05/18/14 1041

## 2014-05-18 NOTE — ED Notes (Signed)
Pt c/o cold sx onset 5 days Sx include: ST, productive cough, laryngitis, fevers, clogged ears, HA Denies v/n/d, SOB, wheezing Taking OTC cold meds w/no relief.  Alert w/no signs of acute distress.

## 2014-05-18 NOTE — Discharge Instructions (Signed)
Otitis Media, Adult Otitis media is redness, soreness, and swelling (inflammation) of the middle ear. Otitis media may be caused by allergies or, most commonly, by infection. Often it occurs as a complication of the common cold. SIGNS AND SYMPTOMS Symptoms of otitis media may include:  Earache.  Fever.  Ringing in your ear.  Headache.  Leakage of fluid from the ear. DIAGNOSIS To diagnose otitis media, your health care provider will examine your ear with an otoscope. This is an instrument that allows your health care provider to see into your ear in order to examine your eardrum. Your health care provider also will ask you questions about your symptoms. TREATMENT  Typically, otitis media resolves on its own within 3 5 days. Your health care provider may prescribe medicine to ease your symptoms of pain. If otitis media does not resolve within 5 days or is recurrent, your health care provider may prescribe antibiotic medicines if he or she suspects that a bacterial infection is the cause. HOME CARE INSTRUCTIONS   Take your medicine as directed until it is gone, even if you feel better after the first few days.  Only take over-the-counter or prescription medicines for pain, discomfort, or fever as directed by your health care provider.  Follow up with your health care provider as directed. SEEK MEDICAL CARE IF:  You have otitis media only in one ear or bleeding from your nose or both.  You notice a lump on your neck.  You are not getting better in 3 5 days.  You feel worse instead of better. SEEK IMMEDIATE MEDICAL CARE IF:   You have pain that is not controlled with medicine.  You have swelling, redness, or pain around your ear or stiffness in your neck.  You notice that part of your face is paralyzed.  You notice that the bone behind your ear (mastoid) is tender when you touch it. MAKE SURE YOU:   Understand these instructions.  Will watch your condition.  Will get help  right away if you are not doing well or get worse. Document Released: 08/24/2004 Document Revised: 09/09/2013 Document Reviewed: 06/16/2013 [facility] Patient Information 2014 [place], [place].  Upper Respiratory Infection, Adult Claritin or allegra for drainage Sudafed PE 10 mg for nasal congestion Flonase nasal spray Lots of saline nasal spray Robitussin DM for cough Drink lots of fluids/water Take medicines regularly for these symptoms as you will have them for several days.  An upper respiratory infection (URI) is also known as the common cold. It is often caused by a type of germ (virus). Colds are easily spread (contagious). You can pass it to others by kissing, coughing, sneezing, or drinking out of the same glass. Usually, you get better in 1 or 2 weeks.  HOME CARE   Only take medicine as told by your doctor.  Use a warm mist humidifier or breathe in steam from a hot shower.  Drink enough water and fluids to keep your pee (urine) clear or pale yellow.  Get plenty of rest.  Return to work when your temperature is back to normal or as told by your doctor. You may use a face mask and wash your hands to stop your cold from spreading. GET HELP RIGHT AWAY IF:   After the first few days, you feel you are getting worse.  You have questions about your medicine.  You have chills, shortness of breath, or brown or red spit (mucus).  You have yellow or brown snot (nasal discharge) or pain  in the face, especially when you bend forward.  You have a fever, puffy (swollen) neck, pain when you swallow, or white spots in the back of your throat.  You have a bad headache, ear pain, sinus pain, or chest pain.  You have a high-pitched whistling sound when you breathe in and out (wheezing).  You have a lasting cough or cough up blood.  You have sore muscles or a stiff neck. MAKE SURE YOU:   Understand these instructions.  Will watch your condition.  Will get help right away if you are  not doing well or get worse. Document Released: 05/07/2008 Document Revised: 02/11/2012 Document Reviewed: 03/26/2011 [facility] Patient Information 2014 [place], [place].

## 2014-05-19 ENCOUNTER — Encounter: Payer: Self-pay | Admitting: Pulmonary Disease

## 2014-05-21 NOTE — ED Provider Notes (Signed)
Medical screening examination/treatment/procedure(s) were performed by resident physician or non-physician practitioner and as supervising physician I was immediately available for consultation/collaboration.   Pauline Good MD.   Billy Fischer, MD 05/21/14 [phone]

## 2014-05-24 ENCOUNTER — Ambulatory Visit: Payer: Managed Care, Other (non HMO) | Admitting: Pulmonary Disease

## 2014-05-27 ENCOUNTER — Encounter: Payer: Self-pay | Admitting: Internal Medicine

## 2014-06-02 NOTE — Telephone Encounter (Signed)
Most recent email from pt on 7.1.15: Message     sorry yes that was the appointment and I cancelled it when you call me - only because i was told i could no longer see Dr Lenna Gilford for primary care.        Do you know if there is a female Dr in the Primary Care office at the [address] address? Would it help if you or Dr Lenna Gilford asked them to take me as a new patient? I finally found a Tour manager who was taking new patients _ Morgan [name] at the [facility] office but i know nothing about her. This has been so frustrating and I would have expected more assistance from your office in getting Dr Jeannine Kitten patients new doctors.    Called spoke with patient to discuss her email.  Apologized to pt for any frustration or inconvenience and informed her that SN will be glad to see her for any issues she may have but d/t his credentialing changing from Primary Care to Pulmonology insurance may not cover the visit/orders/procedures.  Pt okay with this and verbalized her understanding.  She will keep her 7.8.15 ov to establish with Morgan Cedar FNP.  Nothing further needed; will sign off.

## 2014-06-08 ENCOUNTER — Other Ambulatory Visit (HOSPITAL_BASED_OUTPATIENT_CLINIC_OR_DEPARTMENT_OTHER): Payer: Managed Care, Other (non HMO)

## 2014-06-08 ENCOUNTER — Encounter: Payer: Self-pay | Admitting: Oncology

## 2014-06-08 ENCOUNTER — Ambulatory Visit (HOSPITAL_BASED_OUTPATIENT_CLINIC_OR_DEPARTMENT_OTHER): Payer: Managed Care, Other (non HMO) | Admitting: Oncology

## 2014-06-08 VITALS — BP 154/68 | HR 80 | Temp 98.1°F | Resp 18 | Ht 64.0 in | Wt 201.5 lb

## 2014-06-08 DIAGNOSIS — D696 Thrombocytopenia, unspecified: Secondary | ICD-10-CM

## 2014-06-08 DIAGNOSIS — D72821 Monocytosis (symptomatic): Secondary | ICD-10-CM

## 2014-06-08 LAB — CBC WITH DIFFERENTIAL/PLATELET
BASO%: 0.6 % (ref 0.0–2.0)
Basophils Absolute: 0.1 10*3/uL (ref 0.0–0.1)
EOS ABS: 0.1 10*3/uL (ref 0.0–0.5)
EOS%: 0.7 % (ref 0.0–7.0)
HCT: 35.6 % (ref 34.8–46.6)
HGB: 11.9 g/dL (ref 11.6–15.9)
LYMPH%: 11 % — AB (ref 14.0–49.7)
MCH: 30.5 pg (ref 25.1–34.0)
MCHC: 33.5 g/dL (ref 31.5–36.0)
MCV: 91 fL (ref 79.5–101.0)
MONO#: 5 10*3/uL — ABNORMAL HIGH (ref 0.1–0.9)
MONO%: 35.3 % — AB (ref 0.0–14.0)
NEUT%: 52.4 % (ref 38.4–76.8)
NEUTROS ABS: 7.3 10*3/uL — AB (ref 1.5–6.5)
PLATELETS: 116 10*3/uL — AB (ref 145–400)
RBC: 3.91 10*6/uL (ref 3.70–5.45)
RDW: 14.7 % — AB (ref 11.2–14.5)
WBC: 14 10*3/uL — ABNORMAL HIGH (ref 3.9–10.3)
lymph#: 1.5 10*3/uL (ref 0.9–3.3)

## 2014-06-08 LAB — TECHNOLOGIST REVIEW

## 2014-06-08 LAB — COMPREHENSIVE METABOLIC PANEL (CC13)
ALK PHOS: 64 U/L (ref 40–150)
ALT: 55 U/L (ref 0–55)
AST: 49 U/L — AB (ref 5–34)
Albumin: 3.8 g/dL (ref 3.5–5.0)
Anion Gap: 9 mEq/L (ref 3–11)
BILIRUBIN TOTAL: 0.44 mg/dL (ref 0.20–1.20)
BUN: 22.4 mg/dL (ref 7.0–26.0)
CO2: 27 mEq/L (ref 22–29)
Calcium: 9.9 mg/dL (ref 8.4–10.4)
Chloride: 104 mEq/L (ref 98–109)
Creatinine: 1 mg/dL (ref 0.6–1.1)
GLUCOSE: 112 mg/dL (ref 70–140)
POTASSIUM: 4.4 meq/L (ref 3.5–5.1)
Sodium: 140 mEq/L (ref 136–145)
Total Protein: 7.2 g/dL (ref 6.4–8.3)

## 2014-06-08 LAB — CHCC SMEAR

## 2014-06-08 NOTE — Progress Notes (Signed)
Hematology and Oncology Follow Up Visit  Morgan [name] [id number] [birth year] 63 y.o. 06/08/2014 9:06 AM NADEL,SCOTT [name], MDNadel, [name], MD   Principle Diagnosis: 63 year old woman with thrombocytopenia and monocytosis. The differential diagnosis include myelodysplastic syndrome versus reactive findings due to autoimmune disorder. This was observed in January 2015.   Current therapy: Observation and surveillance.  Interim History:  This is a pleasant 63 year old woman seen in follow up. Since her last visit, she was diagnosed with rheumatoid arthritis and currently on Humira which she tolerated it well. She is reporting her wrists pain has improved a lot. She did develop a URI which now have resolved. Otherwise she is completely asymptomatic. She has not reported any fevers or chills or sweats. Is not reporting constitutional symptoms of weight loss or appetite changes. Has not reported any fatigue or tiredness. She has not had any recent hospitalizations or illnesses. She has not reported any abdominal pain or weight loss early satiety. She has not reported any lymphadenopathy or genitourinary complaints. Rest of her review of systems unremarkable.  Medications: I have reviewed the patient's current medications.  Current Outpatient Prescriptions  Medication Sig Dispense Refill  . aspirin 81 MG tablet Take 81 mg by mouth daily. Takes 3 times weekly due to bruising      . calcium carbonate (OS-CAL) 600 MG TABS Take 2 tablets by mouth daily.        . fenofibrate 160 MG tablet Take 1 tablet (160 mg total) by mouth daily.  90 tablet  3  . fexofenadine (ALLEGRA) 180 MG tablet Take 180 mg by mouth daily.      Marland Kitchen HUMIRA PEN 40 MG/0.8ML PNKT Inject 40 mg into the skin every 14 (fourteen) days.      . Multiple Vitamin (MULTIVITAMIN) tablet Take 1 tablet by mouth daily.        . Nutritional Supplements (OSTEO ADVANCE) TABS Take 1 tablet by mouth daily.      . Probiotic Product (PROBIOTIC PO) Take by  mouth daily.      . simvastatin (ZOCOR) 40 MG tablet Take 1 tablet (40 mg total) by mouth at bedtime.  90 tablet  3  . telmisartan-hydrochlorothiazide (MICARDIS HCT) 40-12.5 MG per tablet Take 1 tablet by mouth daily.  90 tablet  3   No current facility-administered medications for this visit.     Allergies: No Known Allergies  Past Medical History, Surgical history, Social history, and Family History were reviewed and updated.   Physical Exam: Blood pressure 154/68, pulse 80, temperature 98.1 F (36.7 C), temperature source Oral, resp. rate 18, height _0  (1.626 m), weight 201 lb 8 oz (91.4 kg), SpO2 99.00%. ECOG: 0 General appearance: alert and cooperative Head: Normocephalic, without obvious abnormality, atraumatic Neck: no adenopathy Lymph nodes: Cervical, supraclavicular, and axillary nodes normal. Heart:regular rate and rhythm, S1, S2 normal, no murmur, click, rub or gallop Lung:chest clear, no wheezing, rales, normal symmetric air entry Abdomin: soft, non-tender, without masses or organomegaly EXT:no erythema, induration, or nodules   Lab Results: Lab Results  Component Value Date   WBC 14.0* 06/08/2014   HGB 11.9 06/08/2014   HCT 35.6 06/08/2014   MCV 91.0 06/08/2014   PLT 116* 06/08/2014     Chemistry      Component Value Date/Time   NA 142 02/16/2014 0807   NA 142 11/18/2013 0813   K 4.3 02/16/2014 0807   K 4.1 11/18/2013 0813   CL 104 11/18/2013 0813   CO2 24 02/16/2014 7510  CO2 27 11/18/2013 0813   BUN 27.6* 02/16/2014 0807   BUN 22 11/18/2013 0813   CREATININE 0.9 02/16/2014 0807   CREATININE 0.98 11/18/2013 0813   CREATININE 0.9 01/13/2013 0842      Component Value Date/Time   CALCIUM 9.9 02/16/2014 0807   CALCIUM 9.8 11/18/2013 0813   ALKPHOS 87 02/16/2014 0807   ALKPHOS 74 11/18/2013 0813   AST 53* 02/16/2014 0807   AST 45* 11/18/2013 0813   ALT 68* 02/16/2014 0807   ALT 54* 11/18/2013 0813   BILITOT 0.36 02/16/2014 0807   BILITOT 0.7 11/18/2013 0813        Impression and Plan:  63 year old woman with the following issues:  1. Monocytosis: Differential diagnosis including myelodysplastic syndrome such as chronic myelomonocytic leukemia as well as possible inflammatory conditions such as rheumatoid arthritis. Laboratory findings today are not much different than previously. We have elected continued observation but if she has any signs of symptoms of a hematological disorder, we will stage her with a bone marrow biopsy.  2. Thrombocytopenia: No active bleeding noted and her platelet counts is relatively stable. Continue to monitor this as I feel is probably part of her hematological condition such as MDS.  3. Followup: Will be in 3 months.  [facility], MD 7/7/20159:06 AM

## 2014-06-09 ENCOUNTER — Ambulatory Visit: Payer: Managed Care, Other (non HMO) | Admitting: Family

## 2014-06-10 ENCOUNTER — Encounter: Payer: Self-pay | Admitting: Oncology

## 2014-06-11 ENCOUNTER — Telehealth: Payer: Self-pay | Admitting: Medical Oncology

## 2014-06-11 NOTE — Telephone Encounter (Signed)
F/U to patient's email regarding lab results and 4 month f/u appt. Patient requested to have lab results mailed to her, informed her will place in out going mail today. Also informed her that she should hear from scheduling dept for four month f/u next week and if she hasn't heard by the end of next week to call the clinic. Patient expressed thanks, denies further questions at this time.

## 2014-06-12 ENCOUNTER — Telehealth: Payer: Self-pay | Admitting: Oncology

## 2014-06-12 NOTE — Telephone Encounter (Signed)
S/w the pt and she is aware of her nov appts. °

## 2014-07-05 ENCOUNTER — Ambulatory Visit: Payer: Managed Care, Other (non HMO) | Admitting: Family

## 2014-07-06 ENCOUNTER — Encounter: Payer: Self-pay | Admitting: Internal Medicine

## 2014-07-06 ENCOUNTER — Encounter: Payer: Self-pay | Admitting: Oncology

## 2014-07-07 ENCOUNTER — Telehealth: Payer: Self-pay | Admitting: *Deleted

## 2014-07-07 ENCOUNTER — Other Ambulatory Visit: Payer: Self-pay | Admitting: Oncology

## 2014-07-07 DIAGNOSIS — D7289 Other specified disorders of white blood cells: Secondary | ICD-10-CM

## 2014-07-07 NOTE — Telephone Encounter (Signed)
Spoke with patient, per dr Alen Blew, he would like to see patient sooner, schedulers will be calling with a time for 07/14/14 for lab and dr visit. Patient verbalized understanding.

## 2014-07-09 ENCOUNTER — Telehealth: Payer: Self-pay | Admitting: Oncology

## 2014-07-09 NOTE — Telephone Encounter (Signed)
s.w. pt and advised on Aug appt....pt ok and aware °

## 2014-07-14 ENCOUNTER — Other Ambulatory Visit (HOSPITAL_BASED_OUTPATIENT_CLINIC_OR_DEPARTMENT_OTHER): Payer: Managed Care, Other (non HMO)

## 2014-07-14 ENCOUNTER — Ambulatory Visit (HOSPITAL_BASED_OUTPATIENT_CLINIC_OR_DEPARTMENT_OTHER): Payer: Managed Care, Other (non HMO) | Admitting: Oncology

## 2014-07-14 VITALS — BP 169/79 | HR 86 | Temp 98.3°F | Resp 18 | Ht 64.0 in | Wt 201.7 lb

## 2014-07-14 DIAGNOSIS — D7289 Other specified disorders of white blood cells: Secondary | ICD-10-CM

## 2014-07-14 DIAGNOSIS — D696 Thrombocytopenia, unspecified: Secondary | ICD-10-CM

## 2014-07-14 DIAGNOSIS — D72821 Monocytosis (symptomatic): Secondary | ICD-10-CM

## 2014-07-14 LAB — CBC WITH DIFFERENTIAL/PLATELET
BASO%: 0.2 % (ref 0.0–2.0)
BASOS ABS: 0 10*3/uL (ref 0.0–0.1)
EOS ABS: 0.1 10*3/uL (ref 0.0–0.5)
EOS%: 0.5 % (ref 0.0–7.0)
HEMATOCRIT: 38.2 % (ref 34.8–46.6)
HGB: 12.5 g/dL (ref 11.6–15.9)
LYMPH#: 2.7 10*3/uL (ref 0.9–3.3)
LYMPH%: 14 % (ref 14.0–49.7)
MCH: 29.8 pg (ref 25.1–34.0)
MCHC: 32.7 g/dL (ref 31.5–36.0)
MCV: 91.2 fL (ref 79.5–101.0)
MONO#: 4.1 10*3/uL — AB (ref 0.1–0.9)
MONO%: 21.4 % — ABNORMAL HIGH (ref 0.0–14.0)
NEUT%: 63.9 % (ref 38.4–76.8)
NEUTROS ABS: 12.4 10*3/uL — AB (ref 1.5–6.5)
Platelets: 139 10*3/uL — ABNORMAL LOW (ref 145–400)
RBC: 4.19 10*6/uL (ref 3.70–5.45)
RDW: 14.7 % — AB (ref 11.2–14.5)
WBC: 19.4 10*3/uL — AB (ref 3.9–10.3)

## 2014-07-14 LAB — CHCC SMEAR

## 2014-07-14 LAB — TECHNOLOGIST REVIEW

## 2014-07-14 NOTE — Progress Notes (Signed)
Hematology and Oncology Follow Up Visit  Morgan [name] [id number] 06-19-1951 63 y.o. 07/14/2014 12:17 PM MorganSCOTT Jerilynn [name], MDNadel, Morgan Medina, MD   Principle Diagnosis: 63 year old woman with thrombocytopenia and monocytosis. The differential diagnosis include myelodysplastic syndrome versus reactive findings due to autoimmune disorder. This was observed in January 2015.   Current therapy: Observation and surveillance.  Interim History:  Ms. Morgan [name] presents today for a follow up. Since her last visit, she developed rheumatoid arthritis flare in her right arm and was started on 20 mg of prednisone. His CBC was checked and showed an increased white cell count is 37,000 I was asked to comment on that. I asked her to come sooner than her scheduled appointment in November of 2015. She has reported that her flair has improved and  her wrists pain has improved a lot. Otherwise she is completely asymptomatic. She has not reported any fevers or chills or sweats. Is not reporting constitutional symptoms of weight loss or appetite changes. Has not reported any fatigue or tiredness. She has not had any recent hospitalizations or illnesses. She has not reported any abdominal pain or weight loss early satiety. She has not reported any lymphadenopathy or genitourinary complaints. Rest of her review of systems unremarkable.  Medications: I have reviewed the patient's current medications.  Current Outpatient Prescriptions  Medication Sig Dispense Refill  . aspirin 81 MG tablet Take 81 mg by mouth daily. Takes 3 times weekly due to bruising      . calcium carbonate (OS-CAL) 600 MG TABS Take 2 tablets by mouth daily.        . fenofibrate 160 MG tablet Take 1 tablet (160 mg total) by mouth daily.  90 tablet  3  . fexofenadine (ALLEGRA) 180 MG tablet Take 180 mg by mouth daily.      Marland Kitchen HUMIRA PEN 40 MG/0.8ML PNKT Inject 40 mg into the skin every 14 (fourteen) days.      . Multiple Vitamin (MULTIVITAMIN) tablet Take 1  tablet by mouth daily.        . Nutritional Supplements (OSTEO ADVANCE) TABS Take 1 tablet by mouth daily.      . Probiotic Product (PROBIOTIC PO) Take by mouth daily.      . simvastatin (ZOCOR) 40 MG tablet Take 1 tablet (40 mg total) by mouth at bedtime.  90 tablet  3  . telmisartan-hydrochlorothiazide (MICARDIS HCT) 40-12.5 MG per tablet Take 1 tablet by mouth daily.  90 tablet  3   No current facility-administered medications for this visit.     Allergies: No Known Allergies  Past Medical History, Surgical history, Social history, and Family History were reviewed and updated.   Physical Exam: Blood pressure 169/79, pulse 86, temperature 98.3 F (36.8 C), temperature source Oral, resp. rate 18, height 5\' 4"  (1.626 m), weight 201 lb 11.2 oz (91.491 kg), SpO2 100.00%. ECOG: 0 General appearance: alert and cooperative Head: Normocephalic, without obvious abnormality, atraumatic Neck: no adenopathy Lymph nodes: Cervical, supraclavicular, and axillary nodes normal. Heart:regular rate and rhythm, S1, S2 normal, no murmur, click, rub or gallop Lung:chest clear, no wheezing, rales, normal symmetric air entry Abdomin: soft, non-tender, without masses or organomegaly EXT:no erythema, induration, or nodules   Lab Results: Lab Results  Component Value Date   WBC 19.4* 07/14/2014   HGB 12.5 07/14/2014   HCT 38.2 07/14/2014   MCV 91.2 07/14/2014   PLT 139* 07/14/2014     Chemistry      Component Value Date/Time   NA 140  06/08/2014 0841   NA 142 11/18/2013 0813   K 4.4 06/08/2014 0841   K 4.1 11/18/2013 0813   CL 104 11/18/2013 0813   CO2 27 06/08/2014 0841   CO2 27 11/18/2013 0813   BUN 22.4 06/08/2014 0841   BUN 22 11/18/2013 0813   CREATININE 1.0 06/08/2014 0841   CREATININE 0.98 11/18/2013 0813   CREATININE 0.9 01/13/2013 0842      Component Value Date/Time   CALCIUM 9.9 06/08/2014 0841   CALCIUM 9.8 11/18/2013 0813   ALKPHOS 64 06/08/2014 0841   ALKPHOS 74 11/18/2013 0813   AST 49*  06/08/2014 0841   AST 45* 11/18/2013 0813   ALT 55 06/08/2014 0841   ALT 54* 11/18/2013 0813   BILITOT 0.44 06/08/2014 0841   BILITOT 0.7 11/18/2013 0813       Impression and Plan:  63 year old woman with the following issues:  1. Monocytosis: Her total white cell count today is drifting back to close to baseline. I believe her leukocytosis was probably related to prednisone. I still  think that a myeloproliferative disorder is a possibility but I see no major changes in her laboratory testing today. We have elected to continue monitoring given the fact that her white cell counts close to baseline at this time.  2. Thrombocytopenia: No active bleeding noted and her platelet counts is relatively stable.  Her count is slightly improved with a recent prednisone indicating possibly an autoimmune component.  3. Followup: Will be in 3 months.  Zola Button, MD 8/12/201512:17 PM

## 2014-08-27 DIAGNOSIS — D72829 Elevated white blood cell count, unspecified: Secondary | ICD-10-CM | POA: Insufficient documentation

## 2014-09-01 ENCOUNTER — Encounter: Payer: Self-pay | Admitting: Oncology

## 2014-09-01 ENCOUNTER — Encounter: Payer: Self-pay | Admitting: Pulmonary Disease

## 2014-10-08 ENCOUNTER — Other Ambulatory Visit (HOSPITAL_BASED_OUTPATIENT_CLINIC_OR_DEPARTMENT_OTHER): Payer: Managed Care, Other (non HMO)

## 2014-10-08 ENCOUNTER — Telehealth: Payer: Self-pay | Admitting: Oncology

## 2014-10-08 ENCOUNTER — Ambulatory Visit (HOSPITAL_BASED_OUTPATIENT_CLINIC_OR_DEPARTMENT_OTHER): Payer: Managed Care, Other (non HMO) | Admitting: Oncology

## 2014-10-08 VITALS — BP 162/66 | HR 91 | Temp 98.4°F | Resp 18 | Ht 64.0 in | Wt 205.8 lb

## 2014-10-08 DIAGNOSIS — D72821 Monocytosis (symptomatic): Secondary | ICD-10-CM

## 2014-10-08 DIAGNOSIS — M069 Rheumatoid arthritis, unspecified: Secondary | ICD-10-CM

## 2014-10-08 DIAGNOSIS — D696 Thrombocytopenia, unspecified: Secondary | ICD-10-CM

## 2014-10-08 LAB — CBC WITH DIFFERENTIAL/PLATELET
BASO%: 0.5 % (ref 0.0–2.0)
Basophils Absolute: 0.1 10*3/uL (ref 0.0–0.1)
EOS ABS: 0.1 10*3/uL (ref 0.0–0.5)
EOS%: 0.3 % (ref 0.0–7.0)
HEMATOCRIT: 33.8 % — AB (ref 34.8–46.6)
HGB: 10.8 g/dL — ABNORMAL LOW (ref 11.6–15.9)
LYMPH#: 2.6 10*3/uL (ref 0.9–3.3)
LYMPH%: 15.3 % (ref 14.0–49.7)
MCH: 29.1 pg (ref 25.1–34.0)
MCHC: 32 g/dL (ref 31.5–36.0)
MCV: 90.9 fL (ref 79.5–101.0)
MONO#: 3.9 10*3/uL — AB (ref 0.1–0.9)
MONO%: 23.2 % — ABNORMAL HIGH (ref 0.0–14.0)
NEUT%: 60.7 % (ref 38.4–76.8)
NEUTROS ABS: 10.2 10*3/uL — AB (ref 1.5–6.5)
Platelets: 127 10*3/uL — ABNORMAL LOW (ref 145–400)
RBC: 3.72 10*6/uL (ref 3.70–5.45)
RDW: 14.7 % — ABNORMAL HIGH (ref 11.2–14.5)
WBC: 16.8 10*3/uL — AB (ref 3.9–10.3)

## 2014-10-08 LAB — COMPREHENSIVE METABOLIC PANEL (CC13)
ALT: 30 U/L (ref 0–55)
ANION GAP: 11 meq/L (ref 3–11)
AST: 21 U/L (ref 5–34)
Albumin: 2.9 g/dL — ABNORMAL LOW (ref 3.5–5.0)
Alkaline Phosphatase: 69 U/L (ref 40–150)
BILIRUBIN TOTAL: 0.34 mg/dL (ref 0.20–1.20)
BUN: 21.1 mg/dL (ref 7.0–26.0)
CALCIUM: 10 mg/dL (ref 8.4–10.4)
CHLORIDE: 105 meq/L (ref 98–109)
CO2: 23 meq/L (ref 22–29)
Creatinine: 0.9 mg/dL (ref 0.6–1.1)
GLUCOSE: 102 mg/dL (ref 70–140)
Potassium: 4.2 mEq/L (ref 3.5–5.1)
SODIUM: 139 meq/L (ref 136–145)
Total Protein: 6.9 g/dL (ref 6.4–8.3)

## 2014-10-08 LAB — TECHNOLOGIST REVIEW

## 2014-10-08 LAB — CHCC SMEAR

## 2014-10-08 NOTE — Progress Notes (Signed)
Hematology and Oncology Follow Up Visit  Morgan [name] [id number] 10/19/1951 62 y.o. 10/08/2014 9:28 AM ANDY,CAMILLE L, MDNadel, [name], MD   Principle Diagnosis: 63 year old woman with thrombocytopenia and monocytosis. The differential diagnosis include myelodysplastic syndrome versus reactive findings due to autoimmune disorder. This was observed in January 2015. She also has a history of rheumatoid arthritis.   Current therapy: Observation and surveillance.  Interim History:  Morgan [name] presents today for a follow up. Since her last visit, she developed a rheumatoid arthritis flare and was treated with prednisone. She had significant involvement in her hands and was not able to perform certain functions. This has improved recently but still has considerable amount of pain while using her hands. She still able to drive with some difficulty. She also was diagnosed with a urinary tract infection and was treated successfully with antibiotics.. She has reported that her flair has improved and  her wrists pain has improved a lot. She has not reported any fevers or chills or sweats. Is not reporting constitutional symptoms of weight loss or appetite changes. Has not reported any fatigue or tiredness. She has not had any recent hospitalizations or illnesses. She has not reported any abdominal pain or weight loss early satiety. She has not reported any lymphadenopathy or genitourinary complaints. Rest of her review of systems unremarkable.  Medications: I have reviewed the patient's current medications.  Current Outpatient Prescriptions  Medication Sig Dispense Refill  . aspirin 81 MG tablet Take 81 mg by mouth daily. Takes 3 times weekly due to bruising    . calcium carbonate (OS-CAL) 600 MG TABS Take 2 tablets by mouth daily.      . fenofibrate 160 MG tablet Take 1 tablet (160 mg total) by mouth daily. 90 tablet 3  . fexofenadine (ALLEGRA) 180 MG tablet Take 180 mg by mouth daily.    Marland Kitchen HUMIRA PEN 40  MG/0.8ML PNKT Inject 40 mg into the skin every 14 (fourteen) days.    . hydrochlorothiazide (MICROZIDE) 12.5 MG capsule Take 12.5 mg by mouth daily.    . Multiple Vitamin (MULTIVITAMIN) tablet Take 1 tablet by mouth daily.      . Nutritional Supplements (OSTEO ADVANCE) TABS Take 1 tablet by mouth daily.    . predniSONE (DELTASONE) 5 MG tablet Take 5 mg by mouth daily.  3  . Probiotic Product (PROBIOTIC PO) Take by mouth daily.    . simvastatin (ZOCOR) 40 MG tablet Take 1 tablet (40 mg total) by mouth at bedtime. 90 tablet 3  . telmisartan-hydrochlorothiazide (MICARDIS HCT) 40-12.5 MG per tablet Take 1 tablet by mouth daily. 90 tablet 3   No current facility-administered medications for this visit.     Allergies: No Known Allergies  Past Medical History, Surgical history, Social history, and Family History were reviewed and updated.   Physical Exam: Blood pressure 162/66, pulse 91, temperature 98.4 F (36.9 C), temperature source Oral, resp. rate 18, height $RemoveBe'5\' 4"'zwOmyMHMw$  (1.626 m), weight 205 lb 12.8 oz (93.35 kg), SpO2 99 %. ECOG: 0 General appearance: alert and cooperative Head: Normocephalic, without obvious abnormality, atraumatic Neck: no adenopathy Lymph nodes: Cervical, supraclavicular, and axillary nodes normal. Heart:regular rate and rhythm, S1, S2 normal, no murmur, click, rub or gallop Lung:chest clear, no wheezing, rales, normal symmetric air entry Abdomin: soft, non-tender, without masses or organomegaly EXT:no erythema, induration, or nodules. Trace edema noted in her lower extremities. Slight swelling in her right wrist and hand.   Lab Results: Lab Results  Component Value Date  WBC 16.8* 10/08/2014   HGB 10.8* 10/08/2014   HCT 33.8* 10/08/2014   MCV 90.9 10/08/2014   PLT 127* 10/08/2014     Chemistry      Component Value Date/Time   NA 140 06/08/2014 0841   NA 142 11/18/2013 0813   K 4.4 06/08/2014 0841   K 4.1 11/18/2013 0813   CL 104 11/18/2013 0813   CO2 27  06/08/2014 0841   CO2 27 11/18/2013 0813   BUN 22.4 06/08/2014 0841   BUN 22 11/18/2013 0813   CREATININE 1.0 06/08/2014 0841   CREATININE 0.98 11/18/2013 0813   CREATININE 0.9 01/13/2013 0842      Component Value Date/Time   CALCIUM 9.9 06/08/2014 0841   CALCIUM 9.8 11/18/2013 0813   ALKPHOS 64 06/08/2014 0841   ALKPHOS 74 11/18/2013 0813   AST 49* 06/08/2014 0841   AST 45* 11/18/2013 0813   ALT 55 06/08/2014 0841   ALT 54* 11/18/2013 0813   BILITOT 0.44 06/08/2014 0841   BILITOT 0.7 11/18/2013 0813       Impression and Plan:  63 year old woman with the following issues:  1. Monocytosis: Her total white cell count today is drifting back to close to baseline. I believe her leukocytosis it is likely reactive related to prednisone and inflammatory arthritis. I still cannot rule out a myeloproliferative disorder such as MDS or CMML. I do not see any rapid progression in her monocytosis and will continue observation. If she develops further changes in her blood counts, we will consider a bone marrow biopsy at this time.  2. Thrombocytopenia: No active bleeding noted and her platelet counts is relatively stable. This could also be related to autoimmune condition but we'll continue to monitor.  3. Followup: Will be in 4 months.  RCVELF,YBOFB, MD 11/6/20159:28 AM

## 2014-10-08 NOTE — Telephone Encounter (Signed)
Gave avs & cal for Feb 2016. °

## 2014-10-25 ENCOUNTER — Encounter: Payer: Self-pay | Admitting: Pulmonary Disease

## 2014-11-25 ENCOUNTER — Encounter (HOSPITAL_BASED_OUTPATIENT_CLINIC_OR_DEPARTMENT_OTHER): Payer: Self-pay

## 2014-11-25 ENCOUNTER — Emergency Department (HOSPITAL_BASED_OUTPATIENT_CLINIC_OR_DEPARTMENT_OTHER)
Admission: EM | Admit: 2014-11-25 | Discharge: 2014-11-25 | Disposition: A | Discharge status: Home / Self Care | Payer: Managed Care, Other (non HMO) | Attending: Emergency Medicine | Admitting: Emergency Medicine

## 2014-11-25 ENCOUNTER — Emergency Department (HOSPITAL_BASED_OUTPATIENT_CLINIC_OR_DEPARTMENT_OTHER): Payer: Managed Care, Other (non HMO)

## 2014-11-25 DIAGNOSIS — W1839XA Other fall on same level, initial encounter: Secondary | ICD-10-CM | POA: Diagnosis not present

## 2014-11-25 DIAGNOSIS — Z87891 Personal history of nicotine dependence: Secondary | ICD-10-CM | POA: Diagnosis not present

## 2014-11-25 DIAGNOSIS — E663 Overweight: Secondary | ICD-10-CM | POA: Diagnosis not present

## 2014-11-25 DIAGNOSIS — M1990 Unspecified osteoarthritis, unspecified site: Secondary | ICD-10-CM | POA: Insufficient documentation

## 2014-11-25 DIAGNOSIS — M7051 Other bursitis of knee, right knee: Secondary | ICD-10-CM | POA: Diagnosis not present

## 2014-11-25 DIAGNOSIS — Y9389 Activity, other specified: Secondary | ICD-10-CM | POA: Insufficient documentation

## 2014-11-25 DIAGNOSIS — Y998 Other external cause status: Secondary | ICD-10-CM | POA: Insufficient documentation

## 2014-11-25 DIAGNOSIS — E785 Hyperlipidemia, unspecified: Secondary | ICD-10-CM | POA: Diagnosis not present

## 2014-11-25 DIAGNOSIS — Z86010 Personal history of colonic polyps: Secondary | ICD-10-CM | POA: Insufficient documentation

## 2014-11-25 DIAGNOSIS — Z87448 Personal history of other diseases of urinary system: Secondary | ICD-10-CM | POA: Insufficient documentation

## 2014-11-25 DIAGNOSIS — W010XXA Fall on same level from slipping, tripping and stumbling without subsequent striking against object, initial encounter: Secondary | ICD-10-CM

## 2014-11-25 DIAGNOSIS — Y929 Unspecified place or not applicable: Secondary | ICD-10-CM | POA: Diagnosis not present

## 2014-11-25 DIAGNOSIS — S8991XA Unspecified injury of right lower leg, initial encounter: Secondary | ICD-10-CM | POA: Diagnosis present

## 2014-11-25 DIAGNOSIS — Z8719 Personal history of other diseases of the digestive system: Secondary | ICD-10-CM | POA: Insufficient documentation

## 2014-11-25 DIAGNOSIS — W19XXXA Unspecified fall, initial encounter: Secondary | ICD-10-CM

## 2014-11-25 HISTORY — DX: Rheumatoid arthritis, unspecified: M06.9

## 2014-11-25 LAB — CBG MONITORING, ED: GLUCOSE-CAPILLARY: 92 mg/dL (ref 70–99)

## 2014-11-25 MED ORDER — NAPROXEN 500 MG PO TABS: 500.0000 mg | ORAL_TABLET | Freq: Two times a day (BID) | ORAL | Status: DC

## 2014-11-25 MED ORDER — HYDROCODONE-ACETAMINOPHEN 5-325 MG PO TABS: 1.0000 | ORAL_TABLET | ORAL | Status: DC | PRN

## 2014-11-25 MED ORDER — LIDOCAINE HCL (PF) 1 % IJ SOLN
5.0000 mL | Freq: Once | INTRAMUSCULAR | Status: DC
  Filled 2014-11-25: qty 5

## 2014-11-25 MED ORDER — OXYCODONE-ACETAMINOPHEN 5-325 MG PO TABS
1.0000 | ORAL_TABLET | Freq: Once | ORAL | Status: AC
Start: 2014-11-25 — End: 2014-11-25
  Administered 2014-11-25: 1 via ORAL
  Filled 2014-11-25: qty 1

## 2014-11-25 NOTE — ED Provider Notes (Signed)
CSN: [id number]     Arrival date & time 11/25/14  1231 History   First MD Initiated Contact with Patient 11/25/14 1243     Chief Complaint  Patient presents with  . Knee Injury     (Consider location/radiation/quality/duration/timing/severity/associated sxs/prior Treatment) HPI Comments: 63 year old female presenting with right knee pain 6 days. Patient reports she had a mechanical fall 6 days ago and landed onto her right knee and right elbow. She states she was initially bruised, was taking Advil with relief. States the bruising went away, however over this past weekend her knee started to become more painful, swollen, red and warm. Denies any fevers. She has been ambulating. Denies numbness or tingling.  The history is provided by the patient.    Past Medical History  Diagnosis Date  . Hearing loss   . Allergic rhinitis   . Abnormal chest x-ray   . Abnormal electrocardiogram   . Venous insufficiency   . Hyperlipidemia   . Overweight(278.02)   . GERD (gastroesophageal reflux disease)   . Diverticulosis of colon   . Colon polyp     polypoid colorectal mucosa/adenomatous  . Fatty liver disease, nonalcoholic   . Cystitis   . Contact dermatitis due to poison ivy   . Lymphocytosis   . Hemorrhoids   . RA (rheumatoid arthritis)    Past Surgical History  Procedure Laterality Date  . Total abdominal hysterectomy  12/86  . Inner ear surgery      childhood; right  . Nasal septum surgery    . Foot surgery      left  . Chemical and laser endovenous ablation of le veins  2008, 2009, 2010, 2011    Dr Eilleen Kempf et al   Family History  Problem Relation Age of Onset  . Heart disease Sister     sister #1  . Breast cancer Other     half-sister  . Hyperlipidemia Mother   . Hypertension Mother   . Liver disease Sister     sister #1 - hx of liver transplant 14+ yrs ago  . Healthy Sister     sister #2  . Colon cancer Neg Hx   . Diabetes Maternal Uncle   . Heart failure Brother    . Diabetes Maternal Aunt     x 2  . Heart failure Son    History  Substance Use Topics  . Smoking status: Former Smoker -- 0.50 packs/day for 15 years    Types: Cigarettes    Quit date: 12/03/2001  . Smokeless tobacco: Never Used  . Alcohol Use: Yes     Comment: occasional   OB History    No data available     Review of Systems  10 Systems reviewed and are negative for acute change except as noted in the HPI.  Allergies  Review of patient's allergies indicates no known allergies.  Home Medications   Prior to Admission medications   Medication Sig Start Date End Date Taking? Authorizing Provider  aspirin 81 MG tablet Take 81 mg by mouth daily. Takes 3 times weekly due to bruising    Historical Provider, MD  calcium carbonate (OS-CAL) 600 MG TABS Take 2 tablets by mouth daily.      Historical Provider, MD  fenofibrate 160 MG tablet Take 1 tablet (160 mg total) by mouth daily. 11/23/13   Noralee Space, MD  fexofenadine (ALLEGRA) 180 MG tablet Take 180 mg by mouth daily.    Historical Provider, MD  HUMIRA PEN  40 MG/0.8ML PNKT Inject 40 mg into the skin every 14 (fourteen) days. 05/17/14   Historical Provider, MD  hydrochlorothiazide (MICROZIDE) 12.5 MG capsule Take 12.5 mg by mouth daily. 10/07/14   Historical Provider, MD  HYDROcodone-acetaminophen (NORCO/VICODIN) 5-325 MG per tablet Take 1-2 tablets by mouth every 4 (four) hours as needed. 11/25/14   Carman Ching, PA-C  Multiple Vitamin (MULTIVITAMIN) tablet Take 1 tablet by mouth daily.      Historical Provider, MD  naproxen (NAPROSYN) 500 MG tablet Take 1 tablet (500 mg total) by mouth 2 (two) times daily. 11/25/14   Carman Ching, PA-C  Nutritional Supplements (OSTEO ADVANCE) TABS Take 1 tablet by mouth daily.    Historical Provider, MD  predniSONE (DELTASONE) 5 MG tablet Take 5 mg by mouth daily. 08/24/14   Historical Provider, MD  Probiotic Product (PROBIOTIC PO) Take by mouth daily.    Historical Provider, MD  simvastatin  (ZOCOR) 40 MG tablet Take 1 tablet (40 mg total) by mouth at bedtime. 11/23/13   Noralee Space, MD   BP 145/78 mmHg  Pulse 78  Temp(Src) 98.6 F (37 C) (Oral)  Resp 17  Ht 5\' 5"  (1.651 m)  Wt 205 lb (92.987 kg)  BMI 34.11 kg/m2  SpO2 100% Physical Exam  Constitutional: She is oriented to person, place, and time. She appears well-developed and well-nourished. No distress.  HENT:  Head: Normocephalic and atraumatic.  Mouth/Throat: Oropharynx is clear and moist.  Eyes: Conjunctivae and EOM are normal.  Neck: Normal range of motion. Neck supple.  Cardiovascular: Normal rate, regular rhythm, normal heart sounds and intact distal pulses.   Pulmonary/Chest: Effort normal and breath sounds normal. No respiratory distress.  Musculoskeletal:  Right knee tender to palpation over patella with swelling, erythema and warmth. Skin intact. Full range of motion, pain with flexion.  Neurological: She is alert and oriented to person, place, and time. No sensory deficit.  Skin: Skin is warm and dry.  Psychiatric: She has a normal mood and affect. Her behavior is normal.  Nursing note and vitals reviewed.   ED Course  ARTHOCENTESIS Date/Time: 11/25/2014 2:07 PM Performed by: Carman Ching Authorized by: Carman Ching Consent: Verbal consent obtained. Written consent obtained. Risks and benefits: risks, benefits and alternatives were discussed Consent given by: patient Patient understanding: patient states understanding of the procedure being performed Imaging studies: imaging studies available Patient identity confirmed: verbally with patient and arm band Indications: joint swelling,  pain and diagnostic evaluation  Body area: knee Joint: right knee Local anesthesia used: yes Anesthesia: local infiltration Local anesthetic: lidocaine 1% with epinephrine Anesthetic total: 1 ml Patient sedated: no Preparation: Patient was prepped and draped in the usual sterile fashion. Needle gauge: 18  G Ultrasound guidance: no Approach: lateral Aspirate: blood-tinged Aspirate amount: 1 mL   (including critical care time) Labs Review Labs Reviewed  BODY FLUID CULTURE  GRAM STAIN  CBG MONITORING, ED    Imaging Review Dg Knee Complete 4 Views Right  11/25/2014   CLINICAL DATA:  Pain following fall 1 week prior. Two day history of swelling and warmth  EXAM: RIGHT KNEE - COMPLETE 4+ VIEW  COMPARISON:  None.  FINDINGS: Frontal, lateral, and bilateral oblique views were obtained. There is prepatellar soft tissue swelling. There is a small joint effusion. There is no demonstrable fracture or dislocation. Joint spaces appear intact. No erosive change. No soft tissue abscess.  IMPRESSION: Prepatellar soft tissue swelling. Question hemorrhage in this area. Small joint effusion.  No fracture. No appreciable arthropathic change.   Electronically Signed   By: Lowella Grip M.D.   On: 11/25/2014 13:10     EKG Interpretation None      MDM   Final diagnoses:  Fall from slip, trip, or stumble, initial encounter  Patellar bursitis of right knee   Pt presenting with right knee pain after fall 6 days prior. She is well appearing and in NAD. AFVSS. Appearance of patellar bursitis. Erythema is only over patella area, not throughout knee. Doubt joint infection. Xray showing prepatellar soft tissue swelling, question hemorrhage. Small joint effusion. Given pt is on humera, and has the erythema of her knee, arthrocentesis attempted. No significant amount of fluid aspirated despite two attempts from different needle insertions. Small amount of blood-tinged fluid that was not enough to send for analysis. Given trauma and appearance, most likely patellar bursitis. Treat with ace wrap, NSAIDs, and short course of vicodin. F/u with PCP. Stable for d/c. Return precautions given. Patient states understanding of treatment care plan and is agreeable.  Discussed with attending Dr. Maryan Rued who also evaluated  patient and agrees with plan of care.   Carman Ching, PA-C 11/25/14 2150  Blanchie Dessert, MD 11/26/14 [phone]

## 2014-11-25 NOTE — Discharge Instructions (Signed)
Take naproxen as prescribed. Rest, ice and elevate your knee. Take Vicodin for severe pain only. No driving or operating heavy machinery while taking vicodin. This medication may cause drowsiness.  Bursitis Bursitis is a swelling and soreness (inflammation) of a fluid-filled sac (bursa) that overlies and protects a joint. It can be caused by injury, overuse of the joint, arthritis or infection. The joints most likely to be affected are the elbows, shoulders, hips and knees. HOME CARE INSTRUCTIONS   Apply ice to the affected area for 15-20 minutes each hour while awake for 2 days. Put the ice in a plastic bag and place a towel between the bag of ice and your skin.  Rest the injured joint as much as possible, but continue to put the joint through a full range of motion, 4 times per day. (The shoulder joint especially becomes rapidly "frozen" if not used.) When the pain lessens, begin normal slow movements and usual activities.  Only take over-the-counter or prescription medicines for pain, discomfort or fever as directed by your caregiver.  Your caregiver may recommend draining the bursa and injecting medicine into the bursa. This may help the healing process.  Follow all instructions for follow-up with your caregiver. This includes any orthopedic referrals, physical therapy and rehabilitation. Any delay in obtaining necessary care could result in a delay or failure of the bursitis to heal and chronic pain. SEEK IMMEDIATE MEDICAL CARE IF:   Your pain increases even during treatment.  You develop an oral temperature above 102 F (38.9 C) and have heat and inflammation over the involved bursa. MAKE SURE YOU:   Understand these instructions.  Will watch your condition.  Will get help right away if you are not doing well or get worse. Document Released: 11/16/2000 Document Revised: 02/11/2012 Document Reviewed: 02/08/2014 [facility] Patient Information 2015 [place], [place]. This information is  not intended to replace advice given to you by your health care provider. Make sure you discuss any questions you have with your health care provider.  Knee Arthrocentesis Arthrocentesis is a procedure for removing fluid from a joint. The procedure is used to remove uncomfortable amounts of fluid from a joint or to get a sample of joint fluid for testing. Testing joint fluid can help your health care provider figure out the cause of the pain or swelling you are having in your joint. Infection or gout, among other conditions, can cause fluid to form in joints, resulting in pain or swelling.  LET YOUR CAREGIVER KNOW ABOUT:   Allergies.  Medications taken including herbs, eye drops, over the counter medications, and creams.  Use of steroids (by mouth or creams).  Previous problems with anesthetics or Novocaine.  History of blood clots (thrombophlebitis).  History of bleeding or blood problems.  Previous surgery.  Other health problems. RISKS AND COMPLICATIONS  A local anesthetic may not numb the area well enough and you may feel some minor discomfort. In rare cases, you may have an allergic reaction to the drug used to numb the skin.  More fluid may form in the joint.  You may develop infection or bleeding. BEFORE THE PROCEDURE  Wash all of the skin around the entire knee area. Try to remove any loose, scaling skin. There is no other specific preparation necessary unless advised otherwise by your caregiver. AFTER THE PROCEDURE   You can go home after the procedure.  You may need to put ice on the joint 15-20 minutes, 03-04 times a day until pain goes away.  You may need to put an elastic bandage on the joint. HOME CARE INSTRUCTIONS   Only take over-the-counter or prescription medicines for pain, discomfort, or fever as directed by your caregiver.  You should avoid stressing the joint. Unless advised otherwise, this includes jogging, bicycling, recreational climbing, hiking, and  other activities that would put a lot of pressure on a knee joint.  Laying down and elevating the leg/knee above the level of your heart can help to minimize return of swelling. SEEK MEDICAL CARE IF:   You have repeated or worsening swelling.  There is drainage from the puncture area.  You develop red streaking that extends above or below the site where the needle had been placed. SEEK IMMEDIATE MEDICAL CARE IF:   You develop a fever.  You have pain that gets worse even though you are taking pain medicine.  The area is red and warm and you have trouble moving the joint. MAKE SURE YOU:   Understand these instructions.  Will watch your condition.  Will get help right away if you are not doing well or get worse. Document Released: 02/07/2007 Document Revised: 02/11/2012 Document Reviewed: 11/03/2007 [facility] Patient Information 2015 [place], [place]. This information is not intended to replace advice given to you by your health care provider. Make sure you discuss any questions you have with your health care provider.

## 2014-11-25 NOTE — ED Notes (Signed)
[place] 12/18-pain to right knee

## 2015-01-07 ENCOUNTER — Other Ambulatory Visit (HOSPITAL_BASED_OUTPATIENT_CLINIC_OR_DEPARTMENT_OTHER): Payer: BLUE CROSS/BLUE SHIELD

## 2015-01-07 ENCOUNTER — Ambulatory Visit (HOSPITAL_BASED_OUTPATIENT_CLINIC_OR_DEPARTMENT_OTHER): Payer: BLUE CROSS/BLUE SHIELD | Admitting: Oncology

## 2015-01-07 ENCOUNTER — Telehealth: Payer: Self-pay | Admitting: Oncology

## 2015-01-07 VITALS — BP 157/91 | HR 85 | Temp 97.9°F | Resp 18 | Ht 65.0 in | Wt 209.1 lb

## 2015-01-07 DIAGNOSIS — D72821 Monocytosis (symptomatic): Secondary | ICD-10-CM

## 2015-01-07 DIAGNOSIS — D696 Thrombocytopenia, unspecified: Secondary | ICD-10-CM

## 2015-01-07 LAB — CBC WITH DIFFERENTIAL/PLATELET
BASO%: 0.2 % (ref 0.0–2.0)
Basophils Absolute: 0 10*3/uL (ref 0.0–0.1)
EOS ABS: 0.1 10*3/uL (ref 0.0–0.5)
EOS%: 0.6 % (ref 0.0–7.0)
HEMATOCRIT: 35.7 % (ref 34.8–46.6)
HEMOGLOBIN: 11.2 g/dL — AB (ref 11.6–15.9)
LYMPH%: 20 % (ref 14.0–49.7)
MCH: 28 pg (ref 25.1–34.0)
MCHC: 31.4 g/dL — AB (ref 31.5–36.0)
MCV: 89.3 fL (ref 79.5–101.0)
MONO#: 4.1 10*3/uL — AB (ref 0.1–0.9)
MONO%: 23 % — ABNORMAL HIGH (ref 0.0–14.0)
NEUT#: 10 10*3/uL — ABNORMAL HIGH (ref 1.5–6.5)
NEUT%: 56.2 % (ref 38.4–76.8)
Platelets: 119 10*3/uL — ABNORMAL LOW (ref 145–400)
RBC: 4 10*6/uL (ref 3.70–5.45)
RDW: 15.5 % — AB (ref 11.2–14.5)
WBC: 17.7 10*3/uL — AB (ref 3.9–10.3)
lymph#: 3.6 10*3/uL — ABNORMAL HIGH (ref 0.9–3.3)

## 2015-01-07 LAB — COMPREHENSIVE METABOLIC PANEL (CC13)
ALBUMIN: 3.6 g/dL (ref 3.5–5.0)
ALK PHOS: 70 U/L (ref 40–150)
ALT: 23 U/L (ref 0–55)
AST: 25 U/L (ref 5–34)
Anion Gap: 10 mEq/L (ref 3–11)
BUN: 28.2 mg/dL — AB (ref 7.0–26.0)
CO2: 28 mEq/L (ref 22–29)
Calcium: 9.8 mg/dL (ref 8.4–10.4)
Chloride: 103 mEq/L (ref 98–109)
Creatinine: 0.9 mg/dL (ref 0.6–1.1)
EGFR: 68 mL/min/{1.73_m2} — ABNORMAL LOW (ref >90)
Glucose: 94 mg/dl (ref 70–140)
Potassium: 4.4 mEq/L (ref 3.5–5.1)
Sodium: 141 mEq/L (ref 136–145)
TOTAL PROTEIN: 7.2 g/dL (ref 6.4–8.3)
Total Bilirubin: 0.34 mg/dL (ref 0.20–1.20)

## 2015-01-07 LAB — TECHNOLOGIST REVIEW: Technologist Review: 5

## 2015-01-07 NOTE — Telephone Encounter (Signed)
Pt confirmed labs/ov per 02/05 POF, gave pt AVS... KJ  °

## 2015-01-07 NOTE — Progress Notes (Signed)
Hematology and Oncology Follow Up Visit  Morgan [name] [id number] Jul 07, 1951 64 y.o. 01/07/2015 9:02 AM Morgan [name], MDAndy, [name], MD   Principle Diagnosis: 64 year old woman with thrombocytopenia and monocytosis. The differential diagnosis include myelodysplastic syndrome versus reactive findings due to autoimmune disorder. This was observed in January 2015. She also has a history of rheumatoid arthritis.   Current therapy: Observation and surveillance.  Interim History:  Morgan [name] presents today for a follow up. Since her last visit, she has not reported any new complaints. She continues to have  rheumatoid arthritis exacerbation that have not responded to previous treatments. She is under consideration to start Remicade in the near future. She has not reported any constitutional symptoms of fevers or chills or weight loss. She did have a fall around Christmas time but did not have any injuries. She still able to drive with some difficulty. She also was diagnosed with a urinary tract infection and was treated successfully with antibiotics. Has not reported any fatigue or tiredness. She has not had any recent hospitalizations or illnesses. She has not reported any abdominal pain or weight loss early satiety. She has not reported any lymphadenopathy or genitourinary complaints. Rest of her review of systems unremarkable.  Medications: I have reviewed the patient's current medications.  Current Outpatient Prescriptions  Medication Sig Dispense Refill  . aspirin 81 MG tablet Take 81 mg by mouth daily. Takes 3 times weekly due to bruising    . calcium carbonate (OS-CAL) 600 MG TABS Take 2 tablets by mouth daily.      . fenofibrate 160 MG tablet Take 1 tablet (160 mg total) by mouth daily. 90 tablet 3  . fexofenadine (ALLEGRA) 180 MG tablet Take 180 mg by mouth daily.    . hydrochlorothiazide (MICROZIDE) 12.5 MG capsule Take 12.5 mg by mouth daily.    Marland Kitchen HYDROcodone-acetaminophen  (NORCO/VICODIN) 5-325 MG per tablet Take 1-2 tablets by mouth every 4 (four) hours as needed. 10 tablet 0  . Multiple Vitamin (MULTIVITAMIN) tablet Take 1 tablet by mouth daily.      . naproxen (NAPROSYN) 500 MG tablet Take 1 tablet (500 mg total) by mouth 2 (two) times daily. 15 tablet 0  . Nutritional Supplements (OSTEO ADVANCE) TABS Take 1 tablet by mouth daily.    . predniSONE (DELTASONE) 5 MG tablet Take 5 mg by mouth daily.  3  . Probiotic Product (PROBIOTIC PO) Take by mouth daily.    . simvastatin (ZOCOR) 40 MG tablet Take 1 tablet (40 mg total) by mouth at bedtime. 90 tablet 3   No current facility-administered medications for this visit.     Allergies: No Known Allergies  Past Medical History, Surgical history, Social history, and Family History were reviewed and updated.   Physical Exam: Blood pressure 157/91, pulse 85, temperature 97.9 F (36.6 C), temperature source Oral, resp. rate 18, height $RemoveBe'5\' 5"'gNDqzWoJV$  (1.651 m), weight 209 lb 1.6 oz (94.847 kg), SpO2 99 %. ECOG: 0 General appearance: alert and cooperative Head: Normocephalic, without obvious abnormality, atraumatic Neck: no adenopathy Lymph nodes: Cervical, supraclavicular, and axillary nodes normal. Heart:regular rate and rhythm, S1, S2 normal, no murmur, click, rub or gallop Lung:chest clear, no wheezing, rales, normal symmetric air entry Abdomin: soft, non-tender, without masses or organomegaly EXT:no erythema, induration, or nodules. Slight swelling in her right wrist and hand.   Lab Results: Lab Results  Component Value Date   WBC 17.7* 01/07/2015   HGB 11.2* 01/07/2015   HCT 35.7 01/07/2015   MCV  89.3 01/07/2015   PLT 119* 01/07/2015     Chemistry      Component Value Date/Time   NA 139 10/08/2014 0852   NA 142 11/18/2013 0813   K 4.2 10/08/2014 0852   K 4.1 11/18/2013 0813   CL 104 11/18/2013 0813   CO2 23 10/08/2014 0852   CO2 27 11/18/2013 0813   BUN 21.1 10/08/2014 0852   BUN 22 11/18/2013 0813    CREATININE 0.9 10/08/2014 0852   CREATININE 0.98 11/18/2013 0813   CREATININE 0.9 01/13/2013 0842      Component Value Date/Time   CALCIUM 10.0 10/08/2014 0852   CALCIUM 9.8 11/18/2013 0813   ALKPHOS 69 10/08/2014 0852   ALKPHOS 74 11/18/2013 0813   AST 21 10/08/2014 0852   AST 45* 11/18/2013 0813   ALT 30 10/08/2014 0852   ALT 54* 11/18/2013 0813   BILITOT 0.34 10/08/2014 0852   BILITOT 0.7 11/18/2013 0813       Impression and Plan:  64 year old woman with the following issues:  1. Monocytosis: Her total white cell count today still elevated but close to baseline. The differential diagnosis includes myeloproliferative disorder such as MDS or CMML versus reactive findings related to her autoimmune disorder. I do not see any rapid progression in her monocytosis or symptomatology. See no need for intervention or treatment. If she develops progressive cytopenia, leukocytosis and constitutional symptoms then we will perform a bone marrow biopsy consider treatment if she indeed has a hematological condition. For the time being we'll continue observation and follow her counts closely.  2. Thrombocytopenia: No active bleeding noted and her platelet counts is relatively stable. This could also be related to autoimmune condition but we'll continue to monitor.  3. Followup: Will be in 4 to 5 months.  [facility], MD 2/5/20169:02 AM

## 2015-05-12 ENCOUNTER — Encounter: Payer: Self-pay | Admitting: Internal Medicine

## 2015-05-12 ENCOUNTER — Ambulatory Visit (INDEPENDENT_AMBULATORY_CARE_PROVIDER_SITE_OTHER): Payer: BLUE CROSS/BLUE SHIELD | Admitting: Internal Medicine

## 2015-05-12 VITALS — BP 138/82 | HR 69 | Ht 63.75 in | Wt 197.4 lb

## 2015-05-12 DIAGNOSIS — R1084 Generalized abdominal pain: Secondary | ICD-10-CM | POA: Diagnosis not present

## 2015-05-12 DIAGNOSIS — R945 Abnormal results of liver function studies: Secondary | ICD-10-CM

## 2015-05-12 DIAGNOSIS — K760 Fatty (change of) liver, not elsewhere classified: Secondary | ICD-10-CM

## 2015-05-12 DIAGNOSIS — K824 Cholesterolosis of gallbladder: Secondary | ICD-10-CM

## 2015-05-12 DIAGNOSIS — R7989 Other specified abnormal findings of blood chemistry: Secondary | ICD-10-CM | POA: Diagnosis not present

## 2015-05-12 DIAGNOSIS — K529 Noninfective gastroenteritis and colitis, unspecified: Secondary | ICD-10-CM

## 2015-05-12 MED ORDER — HYOSCYAMINE SULFATE 0.125 MG SL SUBL: SUBLINGUAL_TABLET | SUBLINGUAL | Status: DC

## 2015-05-12 NOTE — Patient Instructions (Signed)
We have sent the following medications to your pharmacy for you to pick up at your convenience:  Vandiver will need a follow up ultrasound in 1 year.  Please follow up with [name] on 05/27/2015 at 2:00pm

## 2015-05-12 NOTE — Progress Notes (Signed)
HISTORY OF PRESENT ILLNESS:  Morgan [name] is a 64 y.o. female who is sent by her primary provider's office, Gwenlyn Perking NP, regarding acute GI symptoms, abdominal pain, and abnormal liver tests and gallbladder imaging. The patient has been followed in this office previously for GERD, adenomatous colon polyps, diverticular disease, and resolved postinfectious IBS. Patient is accompanied today by her husband. Reports being in her usual state of health until 2 weeks ago when [place] the beach developed acute nausea with recurrent episodes of diarrhea and low-grade fever. There was associated lower abdominal cramping. Following week she was seen in her primary provider's office. Blood work was obtained. Abdominal cramping progressed as did increased frequency of bowel movements. She was reevaluated. Felt possibly to have a urinary tract infection and treated was ciprofloxacin. Culture returned negative. Ciprofloxacin discontinued after 3 days. Blood work did reveal mild elevation of hepatic transaminases for which an abdominal ultrasound was performed. She was noted to have fatty liver as well as possible small gallbladder polyp and possible adenomyomatosis. She is now referred. She reports having had fevers until yesterday. Abdominal cramping continues but is less. Bowel habits have trended back toward normal. She feels fatigued. There has been no bleeding. Outside laboratories have been reviewed. She has had mild intermittent elevation of liver tests. She is chronically obese. Most recent liver tests reveal AST 67, ALT 67, alkaline phosphatase 153, total bilirubin 0.4. Hepatitis serologies were negative. Ultrasound as described. Of note, ultrasound of the abdomen February 2014 revealed normal gallbladder. Fatty liver was present. Her last colonoscopy was performed September 2013. Mild sigmoid diverticulosis, normal terminal ileum, and normal random colon biopsies. Follow-up in 5 years due to history of adenomas  previously was recommended. She did have EGD in 2011 which was unremarkable  REVIEW OF SYSTEMS:  All non-GI ROS negative except for arthritis, hematuria, allergies  Past Medical History  Diagnosis Date  . Hearing loss   . Allergic rhinitis   . Abnormal chest x-ray   . Abnormal electrocardiogram   . Venous insufficiency   . Hyperlipidemia   . Overweight(278.02)   . GERD (gastroesophageal reflux disease)   . Diverticulosis of colon   . Colon polyp     polypoid colorectal mucosa/adenomatous  . Fatty liver disease, nonalcoholic   . Cystitis   . Contact dermatitis due to poison ivy   . Lymphocytosis   . Hemorrhoids   . RA (rheumatoid arthritis)     Past Surgical History  Procedure Laterality Date  . Total abdominal hysterectomy  12/86  . Inner ear surgery      childhood; right  . Nasal septum surgery    . Foot surgery      left  . Chemical and laser endovenous ablation of le veins  2008, 2009, 2010, 2011    Dr Eilleen Kempf et al    Social History [name]  reports that she quit smoking about 13 years ago. Her smoking use included Cigarettes. She has a 7.5 pack-year smoking history. She has never used smokeless tobacco. She reports that she drinks alcohol. She reports that she does not use illicit drugs.  family history includes Breast cancer in her other; Diabetes in her maternal aunt and maternal uncle; Healthy in her sister; Heart disease in her sister; Heart failure in her brother and son; Hyperlipidemia in her mother; Hypertension in her mother; Liver disease in her sister. There is no history of Colon cancer.  Allergies  Allergen Reactions  . Remicade [Infliximab] Swelling  PHYSICAL EXAMINATION: Vital signs: BP 138/82 mmHg  Pulse 69  Ht 5' 3.75" (1.619 m)  Wt 197 lb 6 oz (89.529 kg)  BMI 34.16 kg/m2  Constitutional: generally well-appearing, no acute distress Psychiatric: alert and oriented x3, cooperative Eyes: extraocular movements intact, anicteric,  conjunctiva pink Mouth: oral pharynx moist, no lesions Neck: supple no lymphadenopathy Cardiovascular: heart regular rate and rhythm, no murmur Lungs: clear to auscultation bilaterally Abdomen: soft, tenderness in the suprapubic region without rebound or mass, nondistended, no obvious ascites, no peritoneal signs, normal bowel sounds, no organomegaly Rectal: Omitted Extremities: no lower extremity edema bilaterally Skin: no lesions on visible extremities Neuro: No focal deficits.   ASSESSMENT:  #1. Acute illness manifested by nausea, diarrhea, and fever. As well lower abdominal cramping. The process is most consistent with infectious gastroenteritis. Improving #2. Fatty liver. Secondary to obesity. #3. Elevated liver tests likely due to fatty liver. Negative chronic hepatitis serologies #4. History of adenomatous colon polyps. Last colonoscopy 2013 without neoplasia. Follow-up 2018 #5. Possible small gallbladder polyp and benign adenomyomatosis. Normal gallbladder reported on ultrasound in 2014   PLAN:  #1. Continue supportive care including hydration and advancement of activity as tolerated #2. Prescribed Levsin sublingual 0.125 mg; 1-2 every 4 hours as needed for cramping pain #3. Office follow-up with me in 2 weeks. Arranged #4. If the patient calls was worsening abdominal pain and/or fever in the interim, would get CT of the abdomen and pelvis #5. Since change in gallbladder imaging in less than 2 years, would recommend repeat ultrasound of the gallbladder 1 year to make sure that possible small gallbladder polyp is stable. They understand and have been advised #6. Exercise and weight loss for fatty liver. Discussed  A copy has been sent to Willaim Sheng NP

## 2015-05-23 ENCOUNTER — Other Ambulatory Visit: Payer: Self-pay | Admitting: Internal Medicine

## 2015-05-27 ENCOUNTER — Ambulatory Visit (INDEPENDENT_AMBULATORY_CARE_PROVIDER_SITE_OTHER): Payer: BLUE CROSS/BLUE SHIELD | Admitting: Internal Medicine

## 2015-05-27 ENCOUNTER — Encounter: Payer: Self-pay | Admitting: Internal Medicine

## 2015-05-27 VITALS — BP 122/60 | HR 80 | Ht 63.75 in | Wt 200.4 lb

## 2015-05-27 DIAGNOSIS — K529 Noninfective gastroenteritis and colitis, unspecified: Secondary | ICD-10-CM | POA: Diagnosis not present

## 2015-05-27 DIAGNOSIS — R945 Abnormal results of liver function studies: Principal | ICD-10-CM

## 2015-05-27 DIAGNOSIS — R7989 Other specified abnormal findings of blood chemistry: Secondary | ICD-10-CM | POA: Diagnosis not present

## 2015-05-27 DIAGNOSIS — K760 Fatty (change of) liver, not elsewhere classified: Secondary | ICD-10-CM

## 2015-05-27 DIAGNOSIS — K824 Cholesterolosis of gallbladder: Secondary | ICD-10-CM

## 2015-05-27 DIAGNOSIS — R1084 Generalized abdominal pain: Secondary | ICD-10-CM

## 2015-05-27 NOTE — Progress Notes (Signed)
HISTORY OF PRESENT ILLNESS:  Morgan [name] is a 64 y.o. female who was evaluated 2 weeks ago for acute GI symptoms including abdominal pain, nausea, diarrhea, and fever. Also elevated liver tests. See that dictation for details. She was felt to have acute gastroenteritis and fatty liver. For the gastroenteritis supportive care and left and sublingual recommended. For the fatty liver, exercise and weight loss. Also gallbladder polyp which was new from previous ultrasound. Since her visit she reports doing well. She has had complete resolution of pain and diarrhea. She did use Levsin but is no longer requiring its use. I reviewed with her her workup and imaging studies.  REVIEW OF SYSTEMS:  All non-GI ROS negative except for sinus trouble, fever, headaches, hearing problems, muscle grams, nosebleeds, ankle swelling,  Past Medical History  Diagnosis Date  . Hearing loss   . Allergic rhinitis   . Abnormal chest x-ray   . Abnormal electrocardiogram   . Venous insufficiency   . Hyperlipidemia   . Overweight(278.02)   . GERD (gastroesophageal reflux disease)   . Diverticulosis of colon   . Colon polyp     polypoid colorectal mucosa/adenomatous  . Fatty liver disease, nonalcoholic   . Cystitis   . Contact dermatitis due to poison ivy   . Lymphocytosis   . Hemorrhoids   . RA (rheumatoid arthritis)   . Gallbladder polyp     Past Surgical History  Procedure Laterality Date  . Total abdominal hysterectomy  12/86  . Inner ear surgery      childhood; right  . Nasal septum surgery    . Foot surgery      left  . Chemical and laser endovenous ablation of le veins  2008, 2009, 2010, 2011    Dr Eilleen Kempf et al    Social History [name]  reports that she quit smoking about 13 years ago. Her smoking use included Cigarettes. She has a 7.5 pack-year smoking history. She has never used smokeless tobacco. She reports that she drinks alcohol. She reports that she does not use illicit  drugs.  family history includes Breast cancer in her other; Diabetes in her maternal aunt and maternal uncle; Healthy in her sister; Heart disease in her sister; Heart failure in her brother and son; Hyperlipidemia in her mother; Hypertension in her mother; Liver disease in her sister. There is no history of Colon cancer.  Allergies  Allergen Reactions  . Remicade [Infliximab] Swelling       PHYSICAL EXAMINATION: Vital signs: BP 122/60 mmHg  Pulse 80  Ht 5' 3.75" (1.619 m)  Wt 200 lb 6 oz (90.89 kg)  BMI 34.68 kg/m2 General: Well-developed, well-nourished, no acute distress HEENT: Sclerae are anicteric, Abdomen: Not reexamined Psychiatric: alert and oriented x3. Cooperative   ASSESSMENT:  #1. Acute gastroenteritis. Resolved #2. Fatty liver #3. Elevated liver tests secondary to fatty liver #4. Diminutive gallbladder polyp. New compared to previous ultrasound #5. Gen. medical problems #6. History of adenomatous colon polyps. Last colonoscopy 2013   PLAN:  #1. Expectant management #2. Exercise and weight loss. Reviewed with patient personally #3. PCP to monitor liver tests #4. Abdominal ultrasound 1 year to reevaluate gallbladder. Recall placed in computer today #5. Surveillance colonoscopy around 2018. Interval follow-up as needed #6. Return to care PCP

## 2015-05-27 NOTE — Patient Instructions (Signed)
You will be contacted when it is time to schedule another ultrasound of your gallblader

## 2015-06-28 ENCOUNTER — Other Ambulatory Visit (HOSPITAL_BASED_OUTPATIENT_CLINIC_OR_DEPARTMENT_OTHER): Payer: BLUE CROSS/BLUE SHIELD

## 2015-06-28 ENCOUNTER — Ambulatory Visit (HOSPITAL_BASED_OUTPATIENT_CLINIC_OR_DEPARTMENT_OTHER): Payer: BLUE CROSS/BLUE SHIELD | Admitting: Oncology

## 2015-06-28 ENCOUNTER — Telehealth: Payer: Self-pay | Admitting: Oncology

## 2015-06-28 VITALS — BP 135/73 | HR 76 | Temp 98.1°F | Resp 18 | Ht 63.75 in | Wt 198.3 lb

## 2015-06-28 DIAGNOSIS — D696 Thrombocytopenia, unspecified: Secondary | ICD-10-CM | POA: Diagnosis not present

## 2015-06-28 DIAGNOSIS — D72821 Monocytosis (symptomatic): Secondary | ICD-10-CM

## 2015-06-28 DIAGNOSIS — M069 Rheumatoid arthritis, unspecified: Secondary | ICD-10-CM

## 2015-06-28 LAB — COMPREHENSIVE METABOLIC PANEL (CC13)
ALK PHOS: 54 U/L (ref 40–150)
ALT: 23 U/L (ref 0–55)
AST: 26 U/L (ref 5–34)
Albumin: 3.9 g/dL (ref 3.5–5.0)
Anion Gap: 8 mEq/L (ref 3–11)
BUN: 22.6 mg/dL (ref 7.0–26.0)
CO2: 27 meq/L (ref 22–29)
Calcium: 10 mg/dL (ref 8.4–10.4)
Chloride: 105 mEq/L (ref 98–109)
Creatinine: 0.9 mg/dL (ref 0.6–1.1)
EGFR: 69 mL/min/{1.73_m2} — AB (ref >90)
Glucose: 87 mg/dl (ref 70–140)
POTASSIUM: 4 meq/L (ref 3.5–5.1)
Sodium: 140 mEq/L (ref 136–145)
Total Bilirubin: 0.4 mg/dL (ref 0.20–1.20)
Total Protein: 7.1 g/dL (ref 6.4–8.3)

## 2015-06-28 LAB — CBC WITH DIFFERENTIAL/PLATELET
BASO%: 0.1 % (ref 0.0–2.0)
BASOS ABS: 0 10*3/uL (ref 0.0–0.1)
EOS ABS: 0.1 10*3/uL (ref 0.0–0.5)
EOS%: 0.4 % (ref 0.0–7.0)
HEMATOCRIT: 38.9 % (ref 34.8–46.6)
HGB: 13 g/dL (ref 11.6–15.9)
LYMPH%: 22.9 % (ref 14.0–49.7)
MCH: 30 pg (ref 25.1–34.0)
MCHC: 33.4 g/dL (ref 31.5–36.0)
MCV: 89.6 fL (ref 79.5–101.0)
MONO#: 3.6 10*3/uL — ABNORMAL HIGH (ref 0.1–0.9)
MONO%: 22.9 % — ABNORMAL HIGH (ref 0.0–14.0)
NEUT%: 53.7 % (ref 38.4–76.8)
NEUTROS ABS: 8.4 10*3/uL — AB (ref 1.5–6.5)
PLATELETS: 120 10*3/uL — AB (ref 145–400)
RBC: 4.34 10*6/uL (ref 3.70–5.45)
RDW: 16 % — ABNORMAL HIGH (ref 11.2–14.5)
WBC: 15.6 10*3/uL — ABNORMAL HIGH (ref 3.9–10.3)
lymph#: 3.6 10*3/uL — ABNORMAL HIGH (ref 0.9–3.3)

## 2015-06-28 LAB — TECHNOLOGIST REVIEW: Technologist Review: 1

## 2015-06-28 LAB — CHCC SMEAR

## 2015-06-28 NOTE — Telephone Encounter (Signed)
Pt confirmed labs/ov per 07/26 POF, gave pt AVS and Calendar.... KJ °

## 2015-06-28 NOTE — Progress Notes (Signed)
Hematology and Oncology Follow Up Visit  Resha P Nalepa [id number] 05/10/1951 64 y.o. 06/28/2015 8:42 AM ANDY,CAMILLE L, MDAndy, Camille L, MD   Principle Diagnosis: 64-year-old woman with thrombocytopenia and monocytosis. The differential diagnosis include myelodysplastic syndrome versus reactive findings due to autoimmune disorder. This was observed in January 2015. She also has a history of rheumatoid arthritis.   Current therapy: Observation and surveillance.  Interim History:  Ms. Canale presents today for a follow up. Since her last visit, she is doing very well. She is currently receiving Simponi  treatment for her rheumatoid arthritis and have so far responded well. Her joint pain and swelling have improved. She did not respond to Remicade previously. She has not reported any constitutional symptoms of fevers or chills or weight loss. Her appetite have been excellent . She still able to drive with some difficulty. She has not reported any fatigue or tiredness. She has not had any recent hospitalizations or illnesses. She has not reported any abdominal pain or weight loss early satiety. She has not reported any lymphadenopathy or genitourinary complaints. She has not reported any headaches, blurry vision, syncope or seizures. Has not reported any skeletal complaints of or fractures. She did have progressive hearing loss and currently has hearing aids. Rest of her review of systems unremarkable.  Medications: I have reviewed the patient's current medications.  Current Outpatient Prescriptions  Medication Sig Dispense Refill  . acetaminophen (TYLENOL) 500 MG tablet Take 500 mg by mouth every 6 (six) hours as needed.    . aspirin 81 MG tablet Take 81 mg by mouth daily. Takes 3 times weekly due to bruising    . calcium carbonate (OS-CAL) 600 MG TABS Take 2 tablets by mouth daily.      . fenofibrate 160 MG tablet Take 1 tablet (160 mg total) by mouth daily. 90 tablet 3  . fexofenadine (ALLEGRA)  180 MG tablet Take 180 mg by mouth daily.    . Golimumab (SIMPONI [place]) Inject into the skin. Infusion.    . hydrochlorothiazide (MICROZIDE) 12.5 MG capsule Take 12.5 mg by mouth daily.    . Multiple Vitamin (MULTIVITAMIN) tablet Take 1 tablet by mouth daily.      . predniSONE (DELTASONE) 5 MG tablet Take 5 mg by mouth daily.  3  . Probiotic Product (PROBIOTIC PO) Take by mouth daily.    . simvastatin (ZOCOR) 40 MG tablet Take 1 tablet (40 mg total) by mouth at bedtime. 90 tablet 3   No current facility-administered medications for this visit.     Allergies:  Allergies  Allergen Reactions  . Remicade [Infliximab] Swelling    Past Medical History, Surgical history, Social history, and Family History were reviewed and updated.   Physical Exam: Blood pressure 135/73, pulse 76, temperature 98.1 F (36.7 C), temperature source Oral, resp. rate 18, height 5' 3.75" (1.619 m), weight 198 lb 4.8 oz (89.948 kg), SpO2 99 %. ECOG: 0 General appearance: alert and cooperative not in any distress. Head: Normocephalic, without obvious abnormality Neck: no adenopathy Lymph nodes: Cervical, supraclavicular, and axillary nodes normal. Heart:regular rate and rhythm, S1, S2 normal, no murmur, click, rub or gallop Lung:chest clear, no wheezing, rales, normal symmetric air entry Abdomin: soft, non-tender, without masses or organomegaly EXT:no erythema, induration, or nodules.    Lab Results: Lab Results  Component Value Date   WBC 15.6* 06/28/2015   HGB 13.0 06/28/2015   HCT 38.9 06/28/2015   MCV 89.6 06/28/2015   PLT 120* 06/28/2015       Chemistry      Component Value Date/Time   NA 141 01/07/2015 0833   NA 142 11/18/2013 0813   K 4.4 01/07/2015 0833   K 4.1 11/18/2013 0813   CL 104 11/18/2013 0813   CO2 28 01/07/2015 0833   CO2 27 11/18/2013 0813   BUN 28.2* 01/07/2015 0833   BUN 22 11/18/2013 0813   CREATININE 0.9 01/07/2015 0833   CREATININE 0.98 11/18/2013 0813   CREATININE 0.9  01/13/2013 0842      Component Value Date/Time   CALCIUM 9.8 01/07/2015 0833   CALCIUM 9.8 11/18/2013 0813   ALKPHOS 70 01/07/2015 0833   ALKPHOS 74 11/18/2013 0813   AST 25 01/07/2015 0833   AST 45* 11/18/2013 0813   ALT 23 01/07/2015 0833   ALT 54* 11/18/2013 0813   BILITOT 0.34 01/07/2015 0833   BILITOT 0.7 11/18/2013 0813       Impression and Plan:  64 year old woman with the following issues:  1. Monocytosis: Her total white cell count today has decreased with decreased in the percentage of her monocytosis. The differential diagnosis includes myeloproliferative disorder such as MDS or CMML versus reactive findings related to her autoimmune disorder. I do not see any rapid progression in her monocytosis or symptomatology.   This supports the hypothesis of this being a reactive although a primary hematological disorder does not completely ruled out. Her improvement in her white cell count have correlated with her declining rheumatoid arthritis flares. However, I plan to continue to monitor her counts closely and perform a bone marrow biopsy if she has any signs to suggest hematological disorder in the future.  2. Thrombocytopenia: No active bleeding noted and her platelet counts is relatively stable. This could also be related to autoimmune condition but we'll continue to monitor.  3. Followup: Will be in 6 months  HBZJIR,CVELF, MD 7/26/20168:42 AM

## 2015-11-29 ENCOUNTER — Other Ambulatory Visit: Payer: Self-pay | Admitting: Internal Medicine

## 2015-12-09 DIAGNOSIS — Z974 Presence of external hearing-aid: Secondary | ICD-10-CM | POA: Insufficient documentation

## 2015-12-29 ENCOUNTER — Ambulatory Visit (HOSPITAL_BASED_OUTPATIENT_CLINIC_OR_DEPARTMENT_OTHER): Payer: BLUE CROSS/BLUE SHIELD | Admitting: Oncology

## 2015-12-29 ENCOUNTER — Other Ambulatory Visit (HOSPITAL_BASED_OUTPATIENT_CLINIC_OR_DEPARTMENT_OTHER): Payer: BLUE CROSS/BLUE SHIELD

## 2015-12-29 ENCOUNTER — Telehealth: Payer: Self-pay | Admitting: Oncology

## 2015-12-29 VITALS — BP 129/74 | HR 74 | Temp 98.5°F | Resp 19 | Wt 209.8 lb

## 2015-12-29 DIAGNOSIS — D696 Thrombocytopenia, unspecified: Secondary | ICD-10-CM

## 2015-12-29 DIAGNOSIS — D72821 Monocytosis (symptomatic): Secondary | ICD-10-CM

## 2015-12-29 DIAGNOSIS — D72829 Elevated white blood cell count, unspecified: Secondary | ICD-10-CM

## 2015-12-29 LAB — CBC WITH DIFFERENTIAL/PLATELET
BASO%: 0.1 % (ref 0.0–2.0)
Basophils Absolute: 0 10*3/uL (ref 0.0–0.1)
EOS ABS: 0.1 10*3/uL (ref 0.0–0.5)
EOS%: 0.3 % (ref 0.0–7.0)
HCT: 39.5 % (ref 34.8–46.6)
HGB: 13.1 g/dL (ref 11.6–15.9)
LYMPH%: 23.1 % (ref 14.0–49.7)
MCH: 30.3 pg (ref 25.1–34.0)
MCHC: 33.2 g/dL (ref 31.5–36.0)
MCV: 91.4 fL (ref 79.5–101.0)
MONO#: 4.2 10*3/uL — AB (ref 0.1–0.9)
MONO%: 28.5 % — ABNORMAL HIGH (ref 0.0–14.0)
NEUT%: 48 % (ref 38.4–76.8)
NEUTROS ABS: 7.1 10*3/uL — AB (ref 1.5–6.5)
PLATELETS: 128 10*3/uL — AB (ref 145–400)
RBC: 4.32 10*6/uL (ref 3.70–5.45)
RDW: 14.5 % (ref 11.2–14.5)
WBC: 14.8 10*3/uL — ABNORMAL HIGH (ref 3.9–10.3)
lymph#: 3.4 10*3/uL — ABNORMAL HIGH (ref 0.9–3.3)

## 2015-12-29 LAB — COMPREHENSIVE METABOLIC PANEL
ALT: 28 U/L (ref 0–55)
ANION GAP: 9 meq/L (ref 3–11)
AST: 28 U/L (ref 5–34)
Albumin: 3.9 g/dL (ref 3.5–5.0)
Alkaline Phosphatase: 55 U/L (ref 40–150)
BILIRUBIN TOTAL: 0.38 mg/dL (ref 0.20–1.20)
BUN: 25.5 mg/dL (ref 7.0–26.0)
CO2: 28 mEq/L (ref 22–29)
Calcium: 10 mg/dL (ref 8.4–10.4)
Chloride: 103 mEq/L (ref 98–109)
Creatinine: 1 mg/dL (ref 0.6–1.1)
EGFR: 62 mL/min/{1.73_m2} — AB (ref >90)
Glucose: 96 mg/dl (ref 70–140)
Potassium: 4.2 mEq/L (ref 3.5–5.1)
Sodium: 140 mEq/L (ref 136–145)
TOTAL PROTEIN: 7.3 g/dL (ref 6.4–8.3)

## 2015-12-29 LAB — TECHNOLOGIST REVIEW

## 2015-12-29 NOTE — Telephone Encounter (Signed)
Gave patient avs report and appointments for July.  °

## 2015-12-29 NOTE — Progress Notes (Signed)
Hematology and Oncology Follow Up Visit  Morgan [name] [id number] [birth year] 65 y.o. 12/29/2015 9:08 AM ANDY,CAMILLE L, MDAndy, [name], MD   Principle Diagnosis: 65 year old woman with thrombocytopenia and monocytosis. The differential diagnosis include myelodysplastic syndrome versus reactive findings due to autoimmune disorder. This was observed in January 2015.   She also has the diagnosis of rheumatoid arthritis.   Current therapy: Observation and surveillance.  Interim History:  Morgan [name] presents today for a follow up. Since her last visit, she did used to do very well. She is receiving therapy for her rheumatoid arthritis which have helped her symptoms dramatically. She is able to function properly and use her hands without any difficulties. She is able to exercise reasonably well including walking. Her joint pain and swelling have improved.   She has not reported any constitutional symptoms of fevers or chills or weight loss. Her appetite have been excellent . She still able to drive without difficulty. She has not reported any fatigue or tiredness. She has not had any recent hospitalizations or illnesses.   She does not report any headaches, blurry vision, syncope or seizures. She does not report any lymphadenopathy or petechiae. She has reported some occasional bloating but no abdominal pain or weight loss early satiety. Has not reported any skeletal complaints of or fractures. Rest of her review of systems unremarkable.  Medications: I have reviewed the patient's current medications.  Current Outpatient Prescriptions  Medication Sig Dispense Refill  . acetaminophen (TYLENOL) 500 MG tablet Take 500 mg by mouth every 6 (six) hours as needed.    Marland Kitchen aspirin 81 MG tablet Take 81 mg by mouth daily. Takes 3 times weekly due to bruising    . calcium carbonate (OS-CAL) 600 MG TABS Take 2 tablets by mouth daily.      . fenofibrate 160 MG tablet Take 1 tablet (160 mg total) by mouth  daily. 90 tablet 3  . fexofenadine (ALLEGRA) 180 MG tablet Take 180 mg by mouth daily.    . Golimumab (SIMPONI [place]) Inject into the skin. Infusion.    . hydrochlorothiazide (MICROZIDE) 12.5 MG capsule Take 12.5 mg by mouth daily.    . hyoscyamine (LEVSIN SL) 0.125 MG SL tablet TAKE 1 TO 2 TABLETS EVERY 4 HOURS AS NEEDED FOR PAIN AND CRAMPING 30 tablet 0  . Multiple Vitamin (MULTIVITAMIN) tablet Take 1 tablet by mouth daily.      . predniSONE (DELTASONE) 5 MG tablet Take 5 mg by mouth daily.  3  . Probiotic Product (PROBIOTIC PO) Take by mouth daily.    . simvastatin (ZOCOR) 40 MG tablet Take 1 tablet (40 mg total) by mouth at bedtime. 90 tablet 3   No current facility-administered medications for this visit.     Allergies:  Allergies  Allergen Reactions  . Remicade [Infliximab] Swelling    Past Medical History, Surgical history, Social history, and Family History were reviewed and updated.   Physical Exam: Blood pressure 129/74, pulse 74, temperature 98.5 F (36.9 C), temperature source Oral, resp. rate 19, weight 209 lb 12.8 oz (95.165 kg), SpO2 100 %. ECOG: 0 General appearance: alert and cooperative appeared without distress. Head: Normocephalic, without obvious abnormality oral ulcers or lesions. Neck: no adenopathy Lymph nodes: Cervical, supraclavicular, and axillary nodes normal. Heart:regular rate and rhythm, S1, S2 normal, no murmur, click, rub or gallop Lung:chest clear, no wheezing, rales, normal symmetric air entry Abdomin: soft, non-tender, without masses or organomegaly EXT:no erythema, induration, or nodules.    Lab Results:  Lab Results  Component Value Date   WBC 14.8* 12/29/2015   HGB 13.1 12/29/2015   HCT 39.5 12/29/2015   MCV 91.4 12/29/2015   PLT 128* 12/29/2015     Chemistry      Component Value Date/Time   NA 140 06/28/2015 0823   NA 142 11/18/2013 0813   K 4.0 06/28/2015 0823   K 4.1 11/18/2013 0813   CL 104 11/18/2013 0813   CO2 27  06/28/2015 0823   CO2 27 11/18/2013 0813   BUN 22.6 06/28/2015 0823   BUN 22 11/18/2013 0813   CREATININE 0.9 06/28/2015 0823   CREATININE 0.98 11/18/2013 0813   CREATININE 0.9 01/13/2013 0842      Component Value Date/Time   CALCIUM 10.0 06/28/2015 0823   CALCIUM 9.8 11/18/2013 0813   ALKPHOS 54 06/28/2015 0823   ALKPHOS 74 11/18/2013 0813   AST 26 06/28/2015 0823   AST 45* 11/18/2013 0813   ALT 23 06/28/2015 0823   ALT 54* 11/18/2013 0813   BILITOT 0.40 06/28/2015 0823   BILITOT 0.7 11/18/2013 0813       Impression and Plan:  65 year old woman with the following issues:  1. Monocytosis: Her total white cell count today has decreased with decreased is still have monocytosis. The differential diagnosis includes myeloproliferative disorder such as MDS or CMML versus reactive findings related to her autoimmune disorder. I do not see any rapid progression in her monocytosis or symptomatology.   The plan is to continue with active surveillance given the fact that she is clinically very well and her monocytosis is asymptomatic. She develops any symptomatology in the future such as constitutional symptoms of fevers or chills or weight loss or rapid increase in her white cell count we will restage her with a bone marrow biopsy.  2. Thrombocytopenia: No active bleeding noted and her platelet counts is also improving back to normal. No intervention is needed at this time I will continue to observe.  3. Followup: Will be in 6 months  Shaydon Lease, MD 1/26/20179:08 AM

## 2016-01-10 ENCOUNTER — Other Ambulatory Visit: Payer: Self-pay | Admitting: Obstetrics and Gynecology

## 2016-01-10 ENCOUNTER — Other Ambulatory Visit: Payer: Self-pay | Admitting: Internal Medicine

## 2016-03-19 ENCOUNTER — Other Ambulatory Visit: Payer: Self-pay | Admitting: Internal Medicine

## 2016-05-07 ENCOUNTER — Other Ambulatory Visit: Payer: Self-pay | Admitting: Internal Medicine

## 2016-06-08 DIAGNOSIS — Z7952 Long term (current) use of systemic steroids: Secondary | ICD-10-CM | POA: Insufficient documentation

## 2016-06-28 ENCOUNTER — Ambulatory Visit (HOSPITAL_BASED_OUTPATIENT_CLINIC_OR_DEPARTMENT_OTHER): Payer: BLUE CROSS/BLUE SHIELD | Admitting: Oncology

## 2016-06-28 ENCOUNTER — Telehealth: Payer: Self-pay | Admitting: Oncology

## 2016-06-28 ENCOUNTER — Other Ambulatory Visit (HOSPITAL_BASED_OUTPATIENT_CLINIC_OR_DEPARTMENT_OTHER): Payer: BLUE CROSS/BLUE SHIELD

## 2016-06-28 VITALS — BP 134/63 | HR 74 | Temp 98.0°F | Resp 18 | Ht 63.75 in | Wt 203.5 lb

## 2016-06-28 DIAGNOSIS — D696 Thrombocytopenia, unspecified: Secondary | ICD-10-CM | POA: Diagnosis not present

## 2016-06-28 DIAGNOSIS — D72829 Elevated white blood cell count, unspecified: Secondary | ICD-10-CM

## 2016-06-28 DIAGNOSIS — D72821 Monocytosis (symptomatic): Secondary | ICD-10-CM

## 2016-06-28 LAB — CBC WITH DIFFERENTIAL/PLATELET
BASO%: 0.2 % (ref 0.0–2.0)
Basophils Absolute: 0 10*3/uL (ref 0.0–0.1)
EOS%: 0.4 % (ref 0.0–7.0)
Eosinophils Absolute: 0.1 10*3/uL (ref 0.0–0.5)
HCT: 39.8 % (ref 34.8–46.6)
HEMOGLOBIN: 13.3 g/dL (ref 11.6–15.9)
LYMPH%: 21.8 % (ref 14.0–49.7)
MCH: 29.8 pg (ref 25.1–34.0)
MCHC: 33.4 g/dL (ref 31.5–36.0)
MCV: 89.2 fL (ref 79.5–101.0)
MONO#: 2.9 10*3/uL — ABNORMAL HIGH (ref 0.1–0.9)
MONO%: 21.7 % — ABNORMAL HIGH (ref 0.0–14.0)
NEUT%: 55.9 % (ref 38.4–76.8)
NEUTROS ABS: 7.4 10*3/uL — AB (ref 1.5–6.5)
Platelets: 114 10*3/uL — ABNORMAL LOW (ref 145–400)
RBC: 4.46 10*6/uL (ref 3.70–5.45)
RDW: 15.3 % — AB (ref 11.2–14.5)
WBC: 13.2 10*3/uL — AB (ref 3.9–10.3)
lymph#: 2.9 10*3/uL (ref 0.9–3.3)

## 2016-06-28 LAB — COMPREHENSIVE METABOLIC PANEL
ALBUMIN: 3.8 g/dL (ref 3.5–5.0)
ALK PHOS: 58 U/L (ref 40–150)
ALT: 28 U/L (ref 0–55)
AST: 29 U/L (ref 5–34)
Anion Gap: 11 mEq/L (ref 3–11)
BUN: 24.9 mg/dL (ref 7.0–26.0)
CO2: 28 mEq/L (ref 22–29)
Calcium: 10.8 mg/dL — ABNORMAL HIGH (ref 8.4–10.4)
Chloride: 103 mEq/L (ref 98–109)
Creatinine: 1 mg/dL (ref 0.6–1.1)
EGFR: 59 mL/min/{1.73_m2} — ABNORMAL LOW (ref >90)
GLUCOSE: 99 mg/dL (ref 70–140)
POTASSIUM: 4 meq/L (ref 3.5–5.1)
SODIUM: 141 meq/L (ref 136–145)
Total Bilirubin: 0.41 mg/dL (ref 0.20–1.20)
Total Protein: 7.6 g/dL (ref 6.4–8.3)

## 2016-06-28 LAB — TECHNOLOGIST REVIEW

## 2016-06-28 LAB — CHCC SMEAR

## 2016-06-28 NOTE — Progress Notes (Signed)
Hematology and Oncology Follow Up Visit  Morgan [name] [id number] 06/09/1951 65 y.o. 06/28/2016 9:18 AM ANDY,CAMILLE L, MDAndy, [name], MD   Principle Diagnosis: 65 year old woman with thrombocytopenia and monocytosis. The differential diagnosis include reactive findings due to autoimmune disorder. Primary hematological disorder felt to be less likely but is also a consideration. This was observed in January 2015. She also has a history of rheumatoid arthritis.   Current therapy: Observation and surveillance.  Interim History:  Morgan [name] presents today for a follow up. Since her last visit, she reports continuous improvement in her rheumatoid arthritis. She is currently receiving Simponi  treatment for her rheumatoid arthritis has any infusion once every 8 weeks. She continues to respond well with improvement in her pain. She remains on prednisone 5 mg daily and hope to be off of it in the near future. She continues to have improvement in her quality of life including able to drive, exercise and appetite been excellent. She denied any decline in her energy or performance status.   She has not reported any constitutional symptoms of fevers or chills or weight loss. She has not reported any headaches, blurry vision, syncope or seizures. She denies chest pain, palpitation, orthopnea or leg edema. She does not report any cough, wheezing or hemoptysis. She does not report any nausea, vomiting, abdominal pain or early satiety. She does not report any frequency urgency or hesitancy. Has not reported any skeletal complaints of or fractures. Rest of her review of systems unremarkable.  Medications:  I personally reviewed her medication is unchanged today. Current Outpatient Prescriptions  Medication Sig Dispense Refill  . acetaminophen (TYLENOL) 500 MG tablet Take 500 mg by mouth every 6 (six) hours as needed.    Marland Kitchen aspirin 81 MG tablet Take 81 mg by mouth daily. Takes 3 times weekly due to bruising     . calcium carbonate (OS-CAL) 600 MG TABS Take 2 tablets by mouth daily.      . fenofibrate 160 MG tablet Take 1 tablet (160 mg total) by mouth daily. 90 tablet 3  . fexofenadine (ALLEGRA) 180 MG tablet Take 180 mg by mouth daily.    Marland Kitchen golimumab (SIMPONI ARIA) 50 MG/4ML SOLN injection Inject into the vein.    . hydrochlorothiazide (HYDRODIURIL) 25 MG tablet Take 25 mg by mouth daily.    . hyoscyamine (LEVSIN SL) 0.125 MG SL tablet TAKE 1 TO 2 TABLETS EVERY 4 HOURS AS NEEDED FOR PAIN AND CRAMPING 30 tablet 0  . Multiple Vitamin (MULTIVITAMIN) tablet Take 1 tablet by mouth daily.      . predniSONE (DELTASONE) 5 MG tablet Take 5 mg by mouth daily.  3  . Probiotic Product (PROBIOTIC PO) Take by mouth daily.    . simvastatin (ZOCOR) 40 MG tablet Take 1 tablet (40 mg total) by mouth at bedtime. 90 tablet 3   No current facility-administered medications for this visit.      Allergies:  Allergies  Allergen Reactions  . Remicade [Infliximab] Swelling    Past Medical History, Surgical history, Social history, and Family History were reviewed and updated.   Physical Exam: Blood pressure 134/63, pulse 74, temperature 98 F (36.7 C), temperature source Oral, resp. rate 18, height 5' 3.75" (1.619 m), weight 203 lb 8 oz (92.3 kg), SpO2 99 %. ECOG: 0 General appearance: Well appearing woman appeared without distress. Head: Normocephalic, without obvious abnormality sclerae anicteric. Neck: no adenopathy Lymph nodes: Cervical, supraclavicular, and axillary nodes normal. Heart:regular rate and rhythm, S1, S2  normal, no murmur, click, rub or gallop Lung:chest clear, no wheezing, rales, normal symmetric air entry no dullness to percussion. Abdomin: soft, non-tender, without masses or organomegaly no shifting dullness or ascites. EXT:no erythema, induration, or nodules.  No joint swelling noted.   Lab Results: Lab Results  Component Value Date   WBC 13.2 (H) 06/28/2016   HGB 13.3 06/28/2016    HCT 39.8 06/28/2016   MCV 89.2 06/28/2016   PLT 114 (L) 06/28/2016     Chemistry      Component Value Date/Time   NA 140 12/29/2015 0838   K 4.2 12/29/2015 0838   CL 104 11/18/2013 0813   CO2 28 12/29/2015 0838   BUN 25.5 12/29/2015 0838   CREATININE 1.0 12/29/2015 0838      Component Value Date/Time   CALCIUM 10.0 12/29/2015 0838   ALKPHOS 55 12/29/2015 0838   AST 28 12/29/2015 0838   ALT 28 12/29/2015 0838   BILITOT 0.38 12/29/2015 0838       Impression and Plan:  65 year old woman with the following issues:  1. Monocytosis The differential diagnosis includes myeloproliferative disorder such as MDS or CMML versus reactive findings related to her autoimmune disorder. Her white cell count today continues to decrease as well as her monocytosis which supports the likelihood that we are dealing with reactive monocytosis Ativan primary hematological disorder.  The plan is to continue with active surveillance and repeat laboratory testing every 6 months. If she develops other cytopenias or signs or symptoms of any hematological disorder, a bone marrow biopsy will be indicated at that time.  2. Thrombocytopenia: No active bleeding noted and her platelet counts is relatively stable. This appears to be related to an autoimmune phenomenon and we'll continue to monitor.  3. Followup: Will be in 6 months  SHADAD,FIRAS, MD 7/27/20179:18 AM

## 2016-06-28 NOTE — Telephone Encounter (Signed)
per pof to sch pt appt-gave pt copy of avs °

## 2016-11-12 ENCOUNTER — Other Ambulatory Visit: Payer: Self-pay | Admitting: Internal Medicine

## 2016-12-05 ENCOUNTER — Encounter: Payer: Self-pay | Admitting: Oncology

## 2016-12-06 ENCOUNTER — Telehealth: Payer: Self-pay

## 2016-12-06 ENCOUNTER — Encounter: Payer: Self-pay | Admitting: *Deleted

## 2016-12-06 NOTE — Telephone Encounter (Signed)
S/w pt per Dr Alen Blew keep appt as is.

## 2016-12-06 NOTE — Telephone Encounter (Signed)
No need for an earlier appointment. She is to keep as appointment as is.

## 2016-12-06 NOTE — Telephone Encounter (Signed)
Pt got pneumonia on Christmas Eve. F/u with PCP showed elevated WBC twice. I requested pt call Dr Jonni Sanger and have labs faxed to Dr Alen Blew. Her f/u appt with Dr Alen Blew is 12/28/16. She is asking if she needs to be seen earlier.

## 2016-12-28 ENCOUNTER — Telehealth: Payer: Self-pay | Admitting: Oncology

## 2016-12-28 ENCOUNTER — Other Ambulatory Visit (HOSPITAL_BASED_OUTPATIENT_CLINIC_OR_DEPARTMENT_OTHER): Payer: BLUE CROSS/BLUE SHIELD

## 2016-12-28 ENCOUNTER — Ambulatory Visit (HOSPITAL_BASED_OUTPATIENT_CLINIC_OR_DEPARTMENT_OTHER): Payer: BLUE CROSS/BLUE SHIELD | Admitting: Oncology

## 2016-12-28 VITALS — BP 152/62 | HR 65 | Temp 97.7°F | Resp 18 | Ht 63.75 in | Wt 204.8 lb

## 2016-12-28 DIAGNOSIS — D696 Thrombocytopenia, unspecified: Secondary | ICD-10-CM

## 2016-12-28 DIAGNOSIS — D72821 Monocytosis (symptomatic): Secondary | ICD-10-CM | POA: Diagnosis not present

## 2016-12-28 LAB — COMPREHENSIVE METABOLIC PANEL
ALK PHOS: 60 U/L (ref 40–150)
ALT: 25 U/L (ref 0–55)
ANION GAP: 9 meq/L (ref 3–11)
AST: 27 U/L (ref 5–34)
Albumin: 4 g/dL (ref 3.5–5.0)
BILIRUBIN TOTAL: 0.51 mg/dL (ref 0.20–1.20)
BUN: 24.5 mg/dL (ref 7.0–26.0)
CO2: 28 mEq/L (ref 22–29)
Calcium: 10.4 mg/dL (ref 8.4–10.4)
Chloride: 103 mEq/L (ref 98–109)
Creatinine: 0.8 mg/dL (ref 0.6–1.1)
EGFR: 73 mL/min/{1.73_m2} — AB (ref >90)
Glucose: 92 mg/dl (ref 70–140)
POTASSIUM: 3.9 meq/L (ref 3.5–5.1)
Sodium: 140 mEq/L (ref 136–145)
TOTAL PROTEIN: 7.7 g/dL (ref 6.4–8.3)

## 2016-12-28 LAB — CBC WITH DIFFERENTIAL/PLATELET
BASO%: 0.6 % (ref 0.0–2.0)
BASOS ABS: 0.1 10*3/uL (ref 0.0–0.1)
EOS ABS: 0.1 10*3/uL (ref 0.0–0.5)
EOS%: 0.5 % (ref 0.0–7.0)
HCT: 39.2 % (ref 34.8–46.6)
HGB: 13 g/dL (ref 11.6–15.9)
LYMPH%: 30.4 % (ref 14.0–49.7)
MCH: 29.5 pg (ref 25.1–34.0)
MCHC: 33.1 g/dL (ref 31.5–36.0)
MCV: 89.2 fL (ref 79.5–101.0)
MONO#: 1.9 10*3/uL — AB (ref 0.1–0.9)
MONO%: 19.5 % — AB (ref 0.0–14.0)
NEUT%: 49 % (ref 38.4–76.8)
NEUTROS ABS: 4.7 10*3/uL (ref 1.5–6.5)
PLATELETS: 138 10*3/uL — AB (ref 145–400)
RBC: 4.4 10*6/uL (ref 3.70–5.45)
RDW: 16.2 % — ABNORMAL HIGH (ref 11.2–14.5)
WBC: 9.5 10*3/uL (ref 3.9–10.3)
lymph#: 2.9 10*3/uL (ref 0.9–3.3)

## 2016-12-28 NOTE — Telephone Encounter (Signed)
Appointments scheduled per 12/28/16 los. Patient was given a copy of the appointment schedule and AVS report, per 12/28/16 los.

## 2016-12-28 NOTE — Progress Notes (Signed)
Hematology and Oncology Follow Up Visit  ASHBY LEFLORE [id number] 1951-11-03 66 y.o. 12/28/2016 9:35 AM ANDY,CAMILLE L, MDAndy, [name], MD   Principle Diagnosis: 66 year old woman with thrombocytopenia and monocytosis. The differential diagnosis include reactive findings due to autoimmune disorder. Primary hematological disorder felt to be less likely but is also a consideration. This was observed in January 2015. She also has a history of rheumatoid arthritis.   Current therapy: Observation and surveillance.  Interim History:  Ms. Conley presents today for a follow up. Since her last visit, she was diagnosed with community-acquired pneumonia and was treated on an outpatient basis. During that time her white cell count did increase up to 50,000 but have normalized at this time. Her respiratory symptoms have resolved and she did have flulike symptoms at that time.   She continues to do well and the treatment of her rheumatoid arthritis. She is currently receiving Simponi  She remains on prednisone 5 mg daily and hope to be off of it in the near future. She continues to have improvement in her quality of life including able to drive, exercise and appetite been excellent. She denied any constitutional symptoms of weight loss or any new lymphadenopathy.   She has not reported any constitutional symptoms of fevers or chills or weight loss. She has not reported any headaches, blurry vision, syncope or seizures. She denies chest pain, palpitation, orthopnea or leg edema. She does not report any cough, wheezing or hemoptysis. She does not report any nausea, vomiting, abdominal pain or early satiety. She does not report any frequency urgency or hesitancy. Has not reported any skeletal complaints of or fractures. Rest of her review of systems unremarkable.  Medications:  I personally reviewed her medication is unchanged today. Current Outpatient Prescriptions  Medication Sig Dispense Refill  .  acetaminophen (TYLENOL) 500 MG tablet Take 500 mg by mouth every 6 (six) hours as needed.    Marland Kitchen aspirin 81 MG tablet Take 81 mg by mouth daily. Takes 3 times weekly due to bruising    . calcium carbonate (OS-CAL) 600 MG TABS Take 2 tablets by mouth daily.      . fenofibrate 160 MG tablet Take 1 tablet (160 mg total) by mouth daily. 90 tablet 3  . fexofenadine (ALLEGRA) 180 MG tablet Take 180 mg by mouth daily.    Marland Kitchen golimumab (SIMPONI ARIA) 50 MG/4ML SOLN injection Inject into the vein.    . hydrochlorothiazide (HYDRODIURIL) 25 MG tablet Take 25 mg by mouth daily.    . hyoscyamine (LEVSIN SL) 0.125 MG SL tablet TAKE 1 TO 2 TABLETS EVERY 4 HOURS AS NEEDED FOR PAIN AND CRAMPING 30 tablet 3  . Multiple Vitamin (MULTIVITAMIN) tablet Take 1 tablet by mouth daily.      . predniSONE (DELTASONE) 5 MG tablet Take 5 mg by mouth daily.  3  . Probiotic Product (PROBIOTIC PO) Take by mouth daily.    . simvastatin (ZOCOR) 40 MG tablet Take 1 tablet (40 mg total) by mouth at bedtime. 90 tablet 3   No current facility-administered medications for this visit.      Allergies:  Allergies  Allergen Reactions  . Remicade [Infliximab] Swelling and Other (See Comments)    Facial swelling, throat was closing up.     Past Medical History, Surgical history, Social history, and Family History were reviewed and updated.   Physical Exam: Blood pressure (!) 152/62, pulse 65, temperature 97.7 F (36.5 C), temperature source Oral, resp. rate 18, height 5' 3.75" (  1.619 m), weight 204 lb 12.8 oz (92.9 kg), SpO2 100 %. ECOG: 0 General appearance: Alert, awake woman without distress.  Head: Normocephalic, without obvious abnormality no oral ulcers or lesions. Neck: no adenopathy Lymph nodes: Cervical, supraclavicular, and axillary nodes normal. Heart:regular rate and rhythm, S1, S2 normal, no murmur, click, rub or gallop Lung:chest clear, no wheezing, rales, normal symmetric air entry no dullness to  percussion. Abdomin: soft, non-tender, without masses or organomegaly no rebound or guarding. EXT:no erythema, induration, or nodules.    Lab Results: Lab Results  Component Value Date   WBC 9.5 12/28/2016   HGB 13.0 12/28/2016   HCT 39.2 12/28/2016   MCV 89.2 12/28/2016   PLT 138 (L) 12/28/2016     Chemistry      Component Value Date/Time   NA 141 06/28/2016 0843   K 4.0 06/28/2016 0843   CL 104 11/18/2013 0813   CO2 28 06/28/2016 0843   BUN 24.9 06/28/2016 0843   CREATININE 1.0 06/28/2016 0843      Component Value Date/Time   CALCIUM 10.8 (H) 06/28/2016 0843   ALKPHOS 58 06/28/2016 0843   AST 29 06/28/2016 0843   ALT 28 06/28/2016 0843   BILITOT 0.41 06/28/2016 0843       Impression and Plan:  66 year old woman with the following issues:  1. Monocytosis The differential diagnosis includes myeloproliferative disorder such as MDS or CMML versus reactive findings related to her autoimmune disorder.   Her white cell count today is normal with essentially normal differential. She still has very mild monocytosis which is resolving at this time.  The plan is to continue with active surveillance and repeat laboratory testing every 6 months. If she develops other cytopenias or signs or symptoms of any hematological disorder, a bone marrow biopsy will be indicated at that time. I doubt this will be an issue in the future.  2. Thrombocytopenia: No active bleeding noted and her platelet counts is improving at this time. She is close to normal range at this time.  3. Followup: Will be in 6 months  Charnae Lill, MD 1/26/20189:35 AM

## 2017-01-25 DIAGNOSIS — E669 Obesity, unspecified: Secondary | ICD-10-CM | POA: Insufficient documentation

## 2017-04-23 ENCOUNTER — Encounter: Payer: Self-pay | Admitting: Oncology

## 2017-04-23 ENCOUNTER — Telehealth: Payer: Self-pay | Admitting: *Deleted

## 2017-04-23 NOTE — Telephone Encounter (Signed)
No note

## 2017-04-23 NOTE — Telephone Encounter (Signed)
Patient requested we send lab results to dr andy and dr Gavin Pound, on may chart. Faxed last labs to both physicians. Lm for cindy on her cell.

## 2017-06-14 ENCOUNTER — Encounter: Payer: Self-pay | Admitting: Oncology

## 2017-06-24 ENCOUNTER — Encounter: Payer: Self-pay | Admitting: Oncology

## 2017-06-28 ENCOUNTER — Other Ambulatory Visit (HOSPITAL_BASED_OUTPATIENT_CLINIC_OR_DEPARTMENT_OTHER): Payer: BLUE CROSS/BLUE SHIELD

## 2017-06-28 ENCOUNTER — Ambulatory Visit (HOSPITAL_BASED_OUTPATIENT_CLINIC_OR_DEPARTMENT_OTHER): Payer: BLUE CROSS/BLUE SHIELD | Admitting: Oncology

## 2017-06-28 ENCOUNTER — Encounter: Payer: Self-pay | Admitting: Oncology

## 2017-06-28 VITALS — BP 129/63 | HR 98 | Temp 98.7°F | Resp 17 | Ht 63.75 in | Wt 193.5 lb

## 2017-06-28 DIAGNOSIS — M069 Rheumatoid arthritis, unspecified: Secondary | ICD-10-CM

## 2017-06-28 DIAGNOSIS — C4492 Squamous cell carcinoma of skin, unspecified: Secondary | ICD-10-CM | POA: Diagnosis not present

## 2017-06-28 DIAGNOSIS — D696 Thrombocytopenia, unspecified: Secondary | ICD-10-CM

## 2017-06-28 DIAGNOSIS — D72821 Monocytosis (symptomatic): Secondary | ICD-10-CM | POA: Diagnosis not present

## 2017-06-28 LAB — COMPREHENSIVE METABOLIC PANEL
ALBUMIN: 4.2 g/dL (ref 3.5–5.0)
ALK PHOS: 49 U/L (ref 40–150)
ALT: 19 U/L (ref 0–55)
AST: 29 U/L (ref 5–34)
Anion Gap: 11 mEq/L (ref 3–11)
BUN: 21.4 mg/dL (ref 7.0–26.0)
CHLORIDE: 101 meq/L (ref 98–109)
CO2: 28 mEq/L (ref 22–29)
Calcium: 10.8 mg/dL — ABNORMAL HIGH (ref 8.4–10.4)
Creatinine: 1 mg/dL (ref 0.6–1.1)
EGFR: 61 mL/min/{1.73_m2} — ABNORMAL LOW (ref >90)
Glucose: 94 mg/dl (ref 70–140)
POTASSIUM: 3.8 meq/L (ref 3.5–5.1)
SODIUM: 139 meq/L (ref 136–145)
Total Bilirubin: 0.68 mg/dL (ref 0.20–1.20)
Total Protein: 7.9 g/dL (ref 6.4–8.3)

## 2017-06-28 LAB — CBC WITH DIFFERENTIAL/PLATELET
BASO%: 0.8 % (ref 0.0–2.0)
BASOS ABS: 0.1 10*3/uL (ref 0.0–0.1)
EOS%: 0.4 % (ref 0.0–7.0)
Eosinophils Absolute: 0 10*3/uL (ref 0.0–0.5)
HCT: 40.9 % (ref 34.8–46.6)
HEMOGLOBIN: 13.7 g/dL (ref 11.6–15.9)
LYMPH%: 22.9 % (ref 14.0–49.7)
MCH: 29.9 pg (ref 25.1–34.0)
MCHC: 33.4 g/dL (ref 31.5–36.0)
MCV: 89.3 fL (ref 79.5–101.0)
MONO#: 2.6 10*3/uL — ABNORMAL HIGH (ref 0.1–0.9)
MONO%: 21.4 % — AB (ref 0.0–14.0)
NEUT%: 54.5 % (ref 38.4–76.8)
NEUTROS ABS: 6.5 10*3/uL (ref 1.5–6.5)
Platelets: 112 10*3/uL — ABNORMAL LOW (ref 145–400)
RBC: 4.58 10*6/uL (ref 3.70–5.45)
RDW: 14.9 % — AB (ref 11.2–14.5)
WBC: 11.9 10*3/uL — AB (ref 3.9–10.3)
lymph#: 2.7 10*3/uL (ref 0.9–3.3)

## 2017-06-28 LAB — TECHNOLOGIST REVIEW

## 2017-06-28 NOTE — Progress Notes (Signed)
Hematology and Oncology Follow Up Visit  Morgan [name] [id number] Apr 11, 1951 66 y.o. 06/28/2017 9:19 AM [name], MDAndy, [name], MD   Principle Diagnosis: 66 year old woman with thrombocytopenia and monocytosis. The differential diagnosis include reactive findings due to autoimmune disorder. Primary hematological disorder felt to be less likely but is also a consideration. This was observed in January 2015. She also has a history of rheumatoid arthritis.   Current therapy: Observation and surveillance.  Interim History:  Morgan [name] presents today for a follow up. Since her last visit, she reports doing reasonably well without any recent complaints. She did have a flareup of her rheumatoid arthritis and required steroid injection. She remains on the prednisone maintenance as well.  She is currently receiving Simponi and has been to well-tolerated for her arthritis. She continues to have improvement in her quality of life including able to drive, exercise and appetite been excellent. She denied any constitutional symptoms of weight loss or any new lymphadenopathy. She did have a squamous cell carcinoma that was removed from her thumb.   She has not reported any constitutional symptoms of fevers or chills or weight loss. She has not reported any headaches, blurry vision, syncope or seizures. She denies chest pain, palpitation, orthopnea or leg edema. She does not report any cough, wheezing or hemoptysis. She does not report any nausea, vomiting, abdominal pain or early satiety. She does not report any frequency urgency or hesitancy. Has not reported any skeletal complaints of or fractures. Rest of her review of systems unremarkable.  Medications:  I personally reviewed her medication is unchanged today. Current Outpatient Prescriptions  Medication Sig Dispense Refill  . acetaminophen (TYLENOL) 500 MG tablet Take 500 mg by mouth every 6 (six) hours as needed.    Marland Kitchen aspirin 81 MG tablet  Take 81 mg by mouth daily. Takes 3 times weekly due to bruising    . calcium carbonate (OS-CAL) 600 MG TABS Take 2 tablets by mouth daily.      . fenofibrate 160 MG tablet Take 1 tablet (160 mg total) by mouth daily. 90 tablet 3  . fexofenadine (ALLEGRA) 180 MG tablet Take 180 mg by mouth daily.    Marland Kitchen golimumab (SIMPONI ARIA) 50 MG/4ML SOLN injection Inject into the vein.    . hydrochlorothiazide (HYDRODIURIL) 25 MG tablet Take 25 mg by mouth daily.    . hyoscyamine (LEVSIN SL) 0.125 MG SL tablet TAKE 1 TO 2 TABLETS EVERY 4 HOURS AS NEEDED FOR PAIN AND CRAMPING 30 tablet 3  . Multiple Vitamin (MULTIVITAMIN) tablet Take 1 tablet by mouth daily.      . predniSONE (DELTASONE) 5 MG tablet Take 5 mg by mouth daily.  3  . Probiotic Product (PROBIOTIC PO) Take by mouth daily.    . simvastatin (ZOCOR) 40 MG tablet Take 1 tablet (40 mg total) by mouth at bedtime. 90 tablet 3   No current facility-administered medications for this visit.      Allergies:  Allergies  Allergen Reactions  . Remicade [Infliximab] Swelling and Other (See Comments)    Facial swelling, throat was closing up.     Past Medical History, Surgical history, Social history, and Family History were reviewed and updated.   Physical Exam: Blood pressure 129/63, pulse 98, temperature 98.7 F (37.1 C), temperature source Oral, resp. rate 17, height 5' 3.75" (1.619 m), weight 193 lb 8 oz (87.8 kg), SpO2 99 %. ECOG: 0 General appearance: Well-appearing woman without distress. Head: Normocephalic, without obvious abnormality no  oral thrush or ulcers. Neck: no adenopathy Lymph nodes: Cervical, supraclavicular, and axillary nodes normal. Heart:regular rate and rhythm, S1, S2 normal, no murmur, click, rub or gallop Lung:chest clear, no wheezing, rales, normal symmetric air entry no dullness to percussion. Abdomin: soft, non-tender, without masses or organomegaly no shifting dullness or ascites. EXT:no erythema, induration, or  nodules.  Skin examination: Showed no rashes or lesions.  Lab Results: Lab Results  Component Value Date   WBC 11.9 (H) 06/28/2017   HGB 13.7 06/28/2017   HCT 40.9 06/28/2017   MCV 89.3 06/28/2017   PLT 112 (L) 06/28/2017     Chemistry      Component Value Date/Time   NA 140 12/28/2016 0910   K 3.9 12/28/2016 0910   CL 104 11/18/2013 0813   CO2 28 12/28/2016 0910   BUN 24.5 12/28/2016 0910   CREATININE 0.8 12/28/2016 0910      Component Value Date/Time   CALCIUM 10.4 12/28/2016 0910   ALKPHOS 60 12/28/2016 0910   AST 27 12/28/2016 0910   ALT 25 12/28/2016 0910   BILITOT 0.51 12/28/2016 0910       Impression and Plan:  66 year old woman with the following issues:  1. Monocytosis The differential diagnosis includes myeloproliferative disorder such as MDS or CMML versus reactive findings related to her autoimmune disorder.   Her white cell count today slightly elevated with a monocytosis. Overall I see no evidence to suggest a hematological condition at this time.  The plan is to continue with active surveillance and repeat laboratory testing every 6 months. If she develops other cytopenias or signs or symptoms of any hematological disorder, a bone marrow biopsy will be indicated at that time.   2. Thrombocytopenia: No active bleeding noted and her platelet counts close to normal range.  3. Squamous cell carcinoma of the skin: We have discussed strategies to protect her skin moving forward. She is scheduled to have a wider excision and if her tumor is more invasive, we have discussed about imaging studies including a PET scan. At this point I do not think she needs it and we will consider that  4. Followup: Will be in 6 months  XIDHWY,SHUOH, MD 7/27/20189:19 AM

## 2017-08-07 DIAGNOSIS — M0609 Rheumatoid arthritis without rheumatoid factor, multiple sites: Secondary | ICD-10-CM | POA: Diagnosis not present

## 2017-08-12 DIAGNOSIS — M79641 Pain in right hand: Secondary | ICD-10-CM | POA: Diagnosis not present

## 2017-08-12 DIAGNOSIS — M0609 Rheumatoid arthritis without rheumatoid factor, multiple sites: Secondary | ICD-10-CM | POA: Diagnosis not present

## 2017-08-12 DIAGNOSIS — M79642 Pain in left hand: Secondary | ICD-10-CM | POA: Diagnosis not present

## 2017-08-12 DIAGNOSIS — Z79899 Other long term (current) drug therapy: Secondary | ICD-10-CM | POA: Diagnosis not present

## 2017-09-06 ENCOUNTER — Encounter: Payer: Self-pay | Admitting: Oncology

## 2017-09-09 ENCOUNTER — Encounter: Payer: Self-pay | Admitting: Internal Medicine

## 2017-09-10 ENCOUNTER — Encounter: Payer: Self-pay | Admitting: Internal Medicine

## 2017-09-13 DIAGNOSIS — N390 Urinary tract infection, site not specified: Secondary | ICD-10-CM | POA: Diagnosis not present

## 2017-09-15 DIAGNOSIS — R1110 Vomiting, unspecified: Secondary | ICD-10-CM | POA: Diagnosis not present

## 2017-09-15 DIAGNOSIS — K5900 Constipation, unspecified: Secondary | ICD-10-CM | POA: Diagnosis not present

## 2017-09-15 DIAGNOSIS — K5730 Diverticulosis of large intestine without perforation or abscess without bleeding: Secondary | ICD-10-CM | POA: Diagnosis not present

## 2017-09-15 DIAGNOSIS — R112 Nausea with vomiting, unspecified: Secondary | ICD-10-CM | POA: Diagnosis not present

## 2017-09-15 DIAGNOSIS — R109 Unspecified abdominal pain: Secondary | ICD-10-CM | POA: Diagnosis not present

## 2017-09-15 DIAGNOSIS — M0579 Rheumatoid arthritis with rheumatoid factor of multiple sites without organ or systems involvement: Secondary | ICD-10-CM | POA: Diagnosis not present

## 2017-09-15 DIAGNOSIS — K5732 Diverticulitis of large intestine without perforation or abscess without bleeding: Secondary | ICD-10-CM | POA: Diagnosis not present

## 2017-09-15 DIAGNOSIS — R9341 Abnormal radiologic findings on diagnostic imaging of renal pelvis, ureter, or bladder: Secondary | ICD-10-CM | POA: Diagnosis not present

## 2017-09-15 DIAGNOSIS — R1030 Lower abdominal pain, unspecified: Secondary | ICD-10-CM | POA: Diagnosis not present

## 2017-09-15 DIAGNOSIS — Z888 Allergy status to other drugs, medicaments and biological substances status: Secondary | ICD-10-CM | POA: Diagnosis not present

## 2017-09-15 DIAGNOSIS — M069 Rheumatoid arthritis, unspecified: Secondary | ICD-10-CM | POA: Diagnosis not present

## 2017-09-15 DIAGNOSIS — E782 Mixed hyperlipidemia: Secondary | ICD-10-CM | POA: Diagnosis not present

## 2017-09-15 DIAGNOSIS — Z86010 Personal history of colonic polyps: Secondary | ICD-10-CM | POA: Diagnosis not present

## 2017-09-15 DIAGNOSIS — K5792 Diverticulitis of intestine, part unspecified, without perforation or abscess without bleeding: Secondary | ICD-10-CM | POA: Diagnosis not present

## 2017-09-15 DIAGNOSIS — Z23 Encounter for immunization: Secondary | ICD-10-CM | POA: Diagnosis not present

## 2017-09-15 DIAGNOSIS — Z85828 Personal history of other malignant neoplasm of skin: Secondary | ICD-10-CM | POA: Diagnosis not present

## 2017-09-15 DIAGNOSIS — Z87891 Personal history of nicotine dependence: Secondary | ICD-10-CM | POA: Diagnosis not present

## 2017-09-15 DIAGNOSIS — R160 Hepatomegaly, not elsewhere classified: Secondary | ICD-10-CM | POA: Diagnosis not present

## 2017-09-15 DIAGNOSIS — R42 Dizziness and giddiness: Secondary | ICD-10-CM | POA: Diagnosis not present

## 2017-09-15 DIAGNOSIS — I10 Essential (primary) hypertension: Secondary | ICD-10-CM | POA: Diagnosis not present

## 2017-09-15 DIAGNOSIS — Z79899 Other long term (current) drug therapy: Secondary | ICD-10-CM | POA: Diagnosis not present

## 2017-09-15 DIAGNOSIS — Z7982 Long term (current) use of aspirin: Secondary | ICD-10-CM | POA: Diagnosis not present

## 2017-09-15 DIAGNOSIS — N390 Urinary tract infection, site not specified: Secondary | ICD-10-CM | POA: Diagnosis not present

## 2017-09-26 ENCOUNTER — Encounter: Payer: Self-pay | Admitting: Internal Medicine

## 2017-09-30 ENCOUNTER — Encounter: Payer: Self-pay | Admitting: Physician Assistant

## 2017-09-30 ENCOUNTER — Ambulatory Visit (INDEPENDENT_AMBULATORY_CARE_PROVIDER_SITE_OTHER): Payer: BLUE CROSS/BLUE SHIELD | Admitting: Physician Assistant

## 2017-09-30 VITALS — BP 126/60 | HR 88 | Ht 63.75 in | Wt 190.2 lb

## 2017-09-30 DIAGNOSIS — R1032 Left lower quadrant pain: Secondary | ICD-10-CM

## 2017-09-30 DIAGNOSIS — K5732 Diverticulitis of large intestine without perforation or abscess without bleeding: Secondary | ICD-10-CM | POA: Diagnosis not present

## 2017-09-30 MED ORDER — HYOSCYAMINE SULFATE 0.125 MG SL SUBL: 0.1250 mg | SUBLINGUAL_TABLET | Freq: Four times a day (QID) | SUBLINGUAL | 3 refills | Status: DC | PRN

## 2017-09-30 NOTE — Progress Notes (Signed)
Chief Complaint: Follow-up from diverticulitis  HPI: [name] is a 66 year old Caucasian female, who typically follows with [name], with a past medical history as listed below including rheumatoid arthritis, who was referred to me by [name], MD for follow-up after being treated in the hospital for diverticulitis.      Per review of chart patient was admitted to [facility] on 09/15/17 for diverticulitis.  Patient had a CT abdomen pelvis without contrast on 09/16/17 which showed findings consistent with acute sigmoid diverticulitis.  The inflamed colon appeared to be adherent to the urinary bladder wall which was thickened.  The patient was at risk for developing a colovesicular fistula, however no gas was identified in the bladder lumen.  Small volume of fluid in the pelvis was likely reactive, no evidence of obstructing urinary tract calculi, borderline hepatomegaly with hepatic steatosis, possible gallbladder sludge, small hiatal hernia and atherosclerosis including coronary artery disease.  Patient's last CBC 09/19/17 revealed a white count elevated at 11.6, though decreased from previous around 19.2 at time of admission, hemoglobin was returning to normal at 12.1.    Patient tells me today that she initially went to the urgent care on 09/13/17 after having an increase of urinary urgency, she was diagnosed with a UTI and started on antibiotics, but her symptoms worsened with lower abdominal pain and pelvic pressure throughout the weekend so she presented to the ER on Sunday.  There she was diagnosed with diverticulitis as seen via imaging above and started on IV abx for 3 days during time of admission and discharged on Omnicef and Flagyl for 10 days.  Patient tells me she just finished her antibiotics on Wednesday, 09/25/17.     She still continues with lower energy than normal and has a mild lower abdominal pressure which seems worse right before bowel movement and is better afterwards.   Patient tells me overall she is feeling much improved, but was making sure that this continued abdominal pain was "normal".  Since leaving the hospital she has remained on a low fiber diet and is using MiraLAX, at half a dose every other day to maintain daily "semi-soft" bowel movements.    Patient does tell me that they were concerned she may form a colovesicular fistula in the hospital and she was told to watch for any "poop when I peed or air".  Patient tells me she has not had any of those symptoms.        Patient does describe a similar episode of diverticulitis about 5-6 years ago but was treated with outpatient antibiotics at that time.    Patient is scheduled for a screening colonoscopy 11/20/17 with [name].    Patient denies continued fever, chills, blood in her stool, melena, weight loss, fatigue, anorexia, nausea, vomiting, heartburn, reflux or symptoms that awaken her at night.  Past Medical History:  Diagnosis Date  . Abnormal chest x-ray   . Abnormal electrocardiogram   . Allergic rhinitis   . Colon polyp    polypoid colorectal mucosa/adenomatous  . Contact dermatitis due to poison ivy   . Cystitis   . Diverticulosis of colon   . Fatty liver disease, nonalcoholic   . Gallbladder polyp   . GERD (gastroesophageal reflux disease)   . Hearing loss   . Hemorrhoids   . Hyperlipidemia   . Lymphocytosis   . Overweight(278.02)   . RA (rheumatoid arthritis) ([place])   . Venous insufficiency     Past Surgical History:  Procedure  Laterality Date  . chemical and laser endovenous ablation of LE veins  2008, 2009, 2010, 2011   Dr Eilleen Kempf et al  . FOOT SURGERY     left  . INNER EAR SURGERY     childhood; right  . NASAL SEPTUM SURGERY    . TOTAL ABDOMINAL HYSTERECTOMY  12/86    Current Outpatient Prescriptions  Medication Sig Dispense Refill  . acetaminophen (TYLENOL) 500 MG tablet Take 500 mg by mouth every 6 (six) hours as needed.    Marland Kitchen aspirin 81 MG tablet Take 81 mg by mouth  daily. Takes 3 times weekly due to bruising    . calcium carbonate (OS-CAL) 600 MG TABS Take 2 tablets by mouth daily.      . fenofibrate 160 MG tablet Take 1 tablet (160 mg total) by mouth daily. 90 tablet 3  . fexofenadine (ALLEGRA) 180 MG tablet Take 180 mg by mouth daily.    Marland Kitchen golimumab (SIMPONI ARIA) 50 MG/4ML SOLN injection Inject 50 mg into the vein every 8 (eight) weeks.     . hydrochlorothiazide (HYDRODIURIL) 25 MG tablet Take 25 mg by mouth daily.    . hydroxychloroquine (PLAQUENIL) 200 MG tablet Take 400 mg by mouth daily.    . hyoscyamine (LEVSIN SL) 0.125 MG SL tablet Take 1 tablet (0.125 mg total) by mouth every 6 (six) hours as needed. 100 tablet 3  . Multiple Vitamin (MULTIVITAMIN) tablet Take 1 tablet by mouth daily.      . predniSONE (DELTASONE) 5 MG tablet Take 5 mg by mouth daily.  3  . Probiotic Product (PROBIOTIC PO) Take by mouth daily.    . simvastatin (ZOCOR) 40 MG tablet Take 1 tablet (40 mg total) by mouth at bedtime. 90 tablet 3   No current facility-administered medications for this visit.     Allergies as of 09/30/2017 - Review Complete 09/30/2017  Allergen Reaction Noted  . Remicade [infliximab] Swelling and Other (See Comments) 05/12/2015    Family History  Problem Relation Age of Onset  . Hyperlipidemia Mother   . Hypertension Mother   . Heart disease Sister        sister #1  . Breast cancer Other        half-sister  . Liver disease Sister        sister #1 - hx of liver transplant 14+ yrs ago  . Healthy Sister        sister #2  . Diabetes Maternal Uncle   . Heart failure Brother   . Diabetes Maternal Aunt        x 2  . Heart failure Son   . Colon cancer Neg Hx     Social History   Social History  . Marital status: Married    Spouse name: Morgan [name]  . Number of children: 4  . Years of education: N/A   Occupational History  . 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 Smoking status: Former Smoker    Packs/day:  0.50    Years: 15.00    Types: Cigarettes    Quit date: 12/03/2001  . Smokeless tobacco: Never Used  . Alcohol use Yes     Comment: occasional  . Drug use: No  . Sexual activity: Not on file   Other Topics Concern  . Not on file   Social History Narrative  . No narrative on file    Review of Systems:    Constitutional: No weight loss Skin:  No rash  Cardiovascular: No chest pain Respiratory: No SOB Gastrointestinal: See HPI and otherwise negative Genitourinary: No dysuria Neurological: No headache Musculoskeletal: No new muscle or joint pain Hematologic: No bleeding Psychiatric: No history of depression or anxiety   Physical Exam:  Vital signs: BP 126/60 (BP Location: Left Arm, Patient Position: Sitting, Cuff Size: Normal)   Pulse 88   Ht 5' 3.75" (1.619 m)   Wt 190 lb 4 oz (86.3 kg)   BMI 32.91 kg/m   Constitutional:   Pleasant overweight Caucasian female appears to be in NAD, Well developed, Well nourished, alert and cooperative Head:  Normocephalic and atraumatic. Eyes:   PEERL, EOMI. No icterus. Conjunctiva pink. Ears:  Normal auditory acuity. Neck:  Supple Throat: Oral cavity and pharynx without inflammation, swelling or lesion.  Respiratory: Respirations even and unlabored. Lungs clear to auscultation bilaterally.   No wheezes, crackles, or rhonchi.  Cardiovascular: Normal S1, S2. No MRG. Regular rate and rhythm. No peripheral edema, cyanosis or pallor.  Gastrointestinal:  Soft, nondistended, mild LLQ ttp, No rebound or guarding. Normal bowel sounds. No appreciable masses or hepatomegaly. Rectal:  Not performed.  Msk:  Symmetrical without gross deformities. Without edema, no deformity or joint abnormality.  Neurologic:  Alert and  oriented x4;  grossly normal neurologically.  Skin:   Dry and intact without significant lesions or rashes. Psychiatric: Demonstrates good judgement and reason without abnormal affect or behaviors.  See HPI for recent imaging and  labs.  Assessment: 1.  Diverticulitis: diagnosed via CT during recent hospitalization, finished 2 days of IV antibiotics and 10 days total of antibiotics as an outpatient, second episode of diverticulitis in the past 5 years, patient is already scheduled for screening colonoscopy in December, feeling much improved at this time 2.  Left lower quadrant pain: Lingering after above  Plan: 1.  Discussed with the patient that it can be normal to continue with a mild amount of left lower quadrant abdominal pain, certainly this can be worse before a bowel movement as stool tends to back up in this area. 2.  Would recommend the patient continue her MiraLAX half a dose every other day as this is working to maintain semi-soft bowel movements every day 3.  Recommend she maintain a low fiber/low residue diet for now and gradually increase to high-fiber diet when she is no longer experiencing abdominal pain 4.  Prescribed Hyoscyamine sulfate 0.125 mg every 6 hours #100 5.  Did discuss with the patient that if she has increasing abdominal pain, starts running a fever, sees blood in her stool or develops any new or worsening symptoms she should call our office and I would recommend she have a repeat CT of the abdomen and pelvis 6.  Keep scheduled colonoscopy December 19 with [name].  Did review risks, benefits, limitations and alternatives the patient agrees to proceed.  Did complete her pre-visit today while in clinic. 7.  Patient to follow in clinic per recommendations from [name] after time of colonoscopy or sooner if necessary.  Ellouise Newer, PA-C [facility] 09/30/2017, 2:44 PM  Cc: [name], MD

## 2017-09-30 NOTE — Patient Instructions (Signed)
You have been scheduled for a colonoscopy. Please follow written instructions given to you at your visit today.  Please pick up your prep supplies at the pharmacy within the next 1-3 days. If you use inhalers (even only as needed), please bring them with you on the day of your procedure. Your physician has requested that you go to www.startemmi.com and enter the access code given to you at your visit today. This web site gives a general overview about your procedure. However, you should still follow specific instructions given to you by our office regarding your preparation for the procedure.  Take Hyoscyamine 0.125 mg every 6 hours.   We have given you a low fiber, low residue handout.

## 2017-10-01 NOTE — Progress Notes (Signed)
Initial assessment and plans reviewed 

## 2017-10-07 ENCOUNTER — Other Ambulatory Visit: Payer: Self-pay

## 2017-10-07 ENCOUNTER — Inpatient Hospital Stay (HOSPITAL_BASED_OUTPATIENT_CLINIC_OR_DEPARTMENT_OTHER)
Admission: EM | Admit: 2017-10-07 | Discharge: 2017-10-14 | DRG: 392 | Disposition: A | Discharge status: Home Health | Payer: BLUE CROSS/BLUE SHIELD | Attending: Internal Medicine | Admitting: Internal Medicine

## 2017-10-07 ENCOUNTER — Telehealth: Payer: Self-pay | Admitting: Physician Assistant

## 2017-10-07 ENCOUNTER — Encounter (HOSPITAL_BASED_OUTPATIENT_CLINIC_OR_DEPARTMENT_OTHER): Payer: Self-pay | Admitting: *Deleted

## 2017-10-07 ENCOUNTER — Emergency Department (HOSPITAL_BASED_OUTPATIENT_CLINIC_OR_DEPARTMENT_OTHER): Payer: BLUE CROSS/BLUE SHIELD

## 2017-10-07 DIAGNOSIS — Z8249 Family history of ischemic heart disease and other diseases of the circulatory system: Secondary | ICD-10-CM | POA: Diagnosis not present

## 2017-10-07 DIAGNOSIS — M069 Rheumatoid arthritis, unspecified: Secondary | ICD-10-CM | POA: Diagnosis not present

## 2017-10-07 DIAGNOSIS — E785 Hyperlipidemia, unspecified: Secondary | ICD-10-CM | POA: Diagnosis present

## 2017-10-07 DIAGNOSIS — K5792 Diverticulitis of intestine, part unspecified, without perforation or abscess without bleeding: Secondary | ICD-10-CM | POA: Diagnosis present

## 2017-10-07 DIAGNOSIS — K760 Fatty (change of) liver, not elsewhere classified: Secondary | ICD-10-CM | POA: Diagnosis present

## 2017-10-07 DIAGNOSIS — D72829 Elevated white blood cell count, unspecified: Secondary | ICD-10-CM | POA: Diagnosis not present

## 2017-10-07 DIAGNOSIS — Z803 Family history of malignant neoplasm of breast: Secondary | ICD-10-CM | POA: Diagnosis not present

## 2017-10-07 DIAGNOSIS — Z87891 Personal history of nicotine dependence: Secondary | ICD-10-CM | POA: Diagnosis not present

## 2017-10-07 DIAGNOSIS — Z86010 Personal history of colonic polyps: Secondary | ICD-10-CM | POA: Diagnosis not present

## 2017-10-07 DIAGNOSIS — Z79899 Other long term (current) drug therapy: Secondary | ICD-10-CM

## 2017-10-07 DIAGNOSIS — N3000 Acute cystitis without hematuria: Secondary | ICD-10-CM | POA: Diagnosis not present

## 2017-10-07 DIAGNOSIS — Z7952 Long term (current) use of systemic steroids: Secondary | ICD-10-CM | POA: Diagnosis not present

## 2017-10-07 DIAGNOSIS — K651 Peritoneal abscess: Secondary | ICD-10-CM

## 2017-10-07 DIAGNOSIS — K219 Gastro-esophageal reflux disease without esophagitis: Secondary | ICD-10-CM | POA: Diagnosis present

## 2017-10-07 DIAGNOSIS — L02211 Cutaneous abscess of abdominal wall: Secondary | ICD-10-CM | POA: Diagnosis not present

## 2017-10-07 DIAGNOSIS — I700 Atherosclerosis of aorta: Secondary | ICD-10-CM | POA: Diagnosis not present

## 2017-10-07 DIAGNOSIS — Z90710 Acquired absence of both cervix and uterus: Secondary | ICD-10-CM | POA: Diagnosis not present

## 2017-10-07 DIAGNOSIS — N3080 Other cystitis without hematuria: Secondary | ICD-10-CM | POA: Diagnosis not present

## 2017-10-07 DIAGNOSIS — N739 Female pelvic inflammatory disease, unspecified: Secondary | ICD-10-CM | POA: Diagnosis not present

## 2017-10-07 DIAGNOSIS — Z888 Allergy status to other drugs, medicaments and biological substances status: Secondary | ICD-10-CM | POA: Diagnosis not present

## 2017-10-07 DIAGNOSIS — E7800 Pure hypercholesterolemia, unspecified: Secondary | ICD-10-CM | POA: Diagnosis not present

## 2017-10-07 DIAGNOSIS — K5780 Diverticulitis of intestine, part unspecified, with perforation and abscess without bleeding: Principal | ICD-10-CM | POA: Diagnosis present

## 2017-10-07 DIAGNOSIS — R102 Pelvic and perineal pain: Secondary | ICD-10-CM | POA: Diagnosis not present

## 2017-10-07 DIAGNOSIS — Z8349 Family history of other endocrine, nutritional and metabolic diseases: Secondary | ICD-10-CM | POA: Diagnosis not present

## 2017-10-07 DIAGNOSIS — I10 Essential (primary) hypertension: Secondary | ICD-10-CM | POA: Diagnosis not present

## 2017-10-07 DIAGNOSIS — E876 Hypokalemia: Secondary | ICD-10-CM | POA: Diagnosis present

## 2017-10-07 DIAGNOSIS — Z452 Encounter for adjustment and management of vascular access device: Secondary | ICD-10-CM | POA: Diagnosis not present

## 2017-10-07 DIAGNOSIS — Z7982 Long term (current) use of aspirin: Secondary | ICD-10-CM | POA: Diagnosis not present

## 2017-10-07 DIAGNOSIS — R1030 Lower abdominal pain, unspecified: Secondary | ICD-10-CM | POA: Diagnosis not present

## 2017-10-07 DIAGNOSIS — D899 Disorder involving the immune mechanism, unspecified: Secondary | ICD-10-CM

## 2017-10-07 DIAGNOSIS — D696 Thrombocytopenia, unspecified: Secondary | ICD-10-CM | POA: Diagnosis present

## 2017-10-07 DIAGNOSIS — K5720 Diverticulitis of large intestine with perforation and abscess without bleeding: Secondary | ICD-10-CM | POA: Diagnosis not present

## 2017-10-07 DIAGNOSIS — K5732 Diverticulitis of large intestine without perforation or abscess without bleeding: Secondary | ICD-10-CM | POA: Diagnosis not present

## 2017-10-07 DIAGNOSIS — E782 Mixed hyperlipidemia: Secondary | ICD-10-CM | POA: Diagnosis present

## 2017-10-07 DIAGNOSIS — D849 Immunodeficiency, unspecified: Secondary | ICD-10-CM

## 2017-10-07 DIAGNOSIS — Z8379 Family history of other diseases of the digestive system: Secondary | ICD-10-CM | POA: Diagnosis not present

## 2017-10-07 DIAGNOSIS — Z833 Family history of diabetes mellitus: Secondary | ICD-10-CM | POA: Diagnosis not present

## 2017-10-07 DIAGNOSIS — R1084 Generalized abdominal pain: Secondary | ICD-10-CM | POA: Diagnosis not present

## 2017-10-07 LAB — CBC WITH DIFFERENTIAL/PLATELET
BAND NEUTROPHILS: 2 %
BASOS ABS: 0 10*3/uL (ref 0.0–0.1)
Basophils Relative: 0 %
EOS ABS: 0.3 10*3/uL (ref 0.0–0.7)
Eosinophils Relative: 2 %
HCT: 38.3 % (ref 36.0–46.0)
Hemoglobin: 12.3 g/dL (ref 12.0–15.0)
LYMPHS ABS: 1.2 10*3/uL (ref 0.7–4.0)
LYMPHS PCT: 7 %
MCH: 29.4 pg (ref 26.0–34.0)
MCHC: 32.1 g/dL (ref 30.0–36.0)
MCV: 91.4 fL (ref 78.0–100.0)
MONO ABS: 3.8 10*3/uL — AB (ref 0.1–1.0)
MONOS PCT: 23 %
Metamyelocytes Relative: 1 %
Myelocytes: 2 %
Neutro Abs: 11.4 10*3/uL — ABNORMAL HIGH (ref 1.7–7.7)
Neutrophils Relative %: 63 %
PLATELETS: 114 10*3/uL — AB (ref 150–400)
RBC: 4.19 MIL/uL (ref 3.87–5.11)
RDW: 15.4 % (ref 11.5–15.5)
WBC: 16.7 10*3/uL — AB (ref 4.0–10.5)

## 2017-10-07 LAB — COMPREHENSIVE METABOLIC PANEL
ALT: 17 U/L (ref 14–54)
AST: 23 U/L (ref 15–41)
Albumin: 3.7 g/dL (ref 3.5–5.0)
Alkaline Phosphatase: 49 U/L (ref 38–126)
Anion gap: 7 (ref 5–15)
BUN: 15 mg/dL (ref 6–20)
CHLORIDE: 100 mmol/L — AB (ref 101–111)
CO2: 29 mmol/L (ref 22–32)
Calcium: 9.7 mg/dL (ref 8.9–10.3)
Creatinine, Ser: 0.87 mg/dL (ref 0.44–1.00)
GFR calc Af Amer: >60 mL/min (ref >60)
Glucose, Bld: 99 mg/dL (ref 65–99)
POTASSIUM: 3.5 mmol/L (ref 3.5–5.1)
Sodium: 136 mmol/L (ref 135–145)
Total Bilirubin: 0.3 mg/dL (ref 0.3–1.2)
Total Protein: 7.5 g/dL (ref 6.5–8.1)

## 2017-10-07 LAB — URINALYSIS, ROUTINE W REFLEX MICROSCOPIC
Bilirubin Urine: NEGATIVE
Glucose, UA: NEGATIVE mg/dL
KETONES UR: NEGATIVE mg/dL
NITRITE: NEGATIVE
PROTEIN: NEGATIVE mg/dL
Specific Gravity, Urine: 1.01 (ref 1.005–1.030)
pH: 6.5 (ref 5.0–8.0)

## 2017-10-07 LAB — URINALYSIS, MICROSCOPIC (REFLEX)

## 2017-10-07 LAB — LIPASE, BLOOD: LIPASE: 28 U/L (ref 11–51)

## 2017-10-07 IMAGING — CT CT ABD-PELV W/ CM
2 of 5 series · 15 of 46 positions shown, 17 images · IV contrast (APPLIED)
Comparison: 06/20/2012

CLINICAL DATA: Low pelvic pain with a recent diagnosis of
diverticulitis.

EXAM:
CT ABDOMEN AND PELVIS WITH CONTRAST
TECHNIQUE: Multidetector CT imaging of the abdomen and pelvis was performed
using the standard protocol following bolus administration of
intravenous contrast.
CONTRAST:  100mL 1IBVC5-IGG IOPAMIDOL (1IBVC5-IGG) INJECTION 61%

[Series 2: axial st · axial · 0.98mm/px · z∈[-742,-352]mm · 12 of 88 slices shown, 14 images]
[im 5/88  soft-tissue]
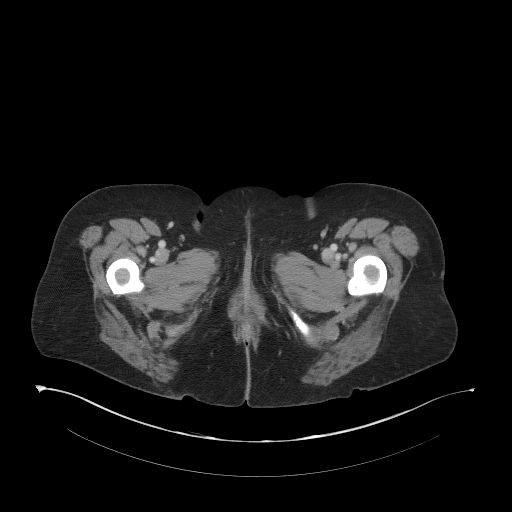
[im 5/88  bone]
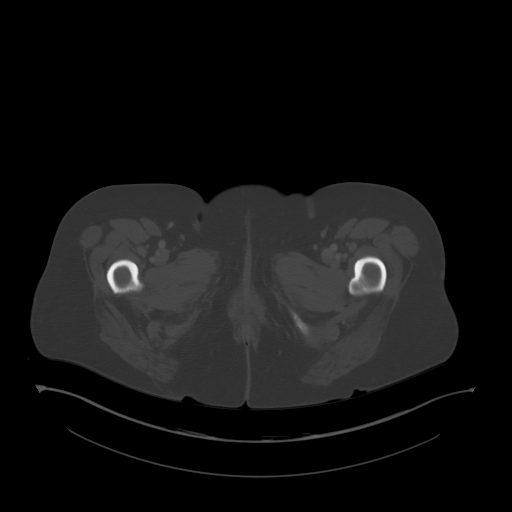
[im 15/88  soft-tissue]
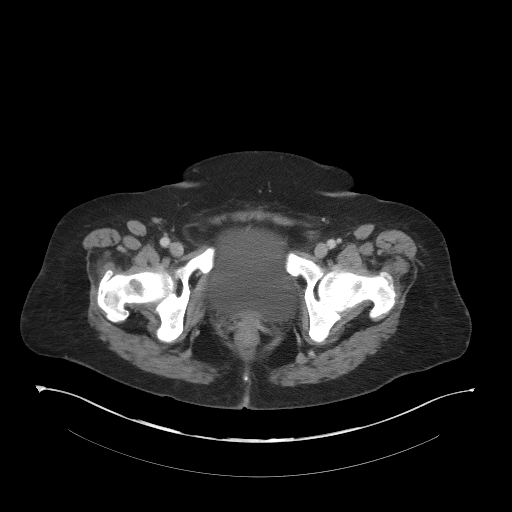
[im 20/88  soft-tissue]
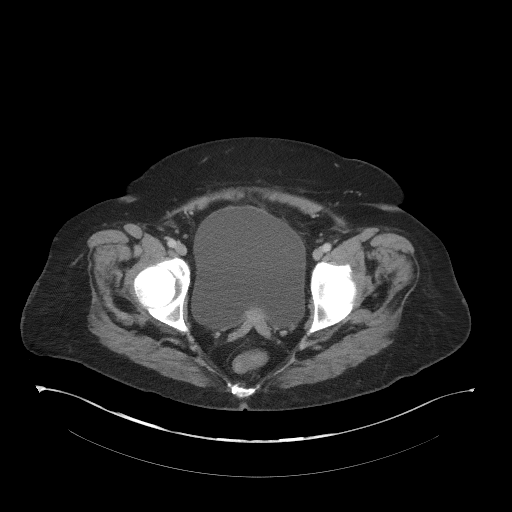
[im 25/88  soft-tissue]
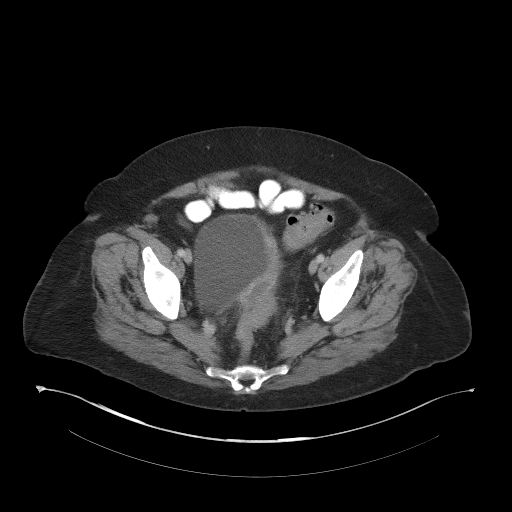
[im 34/88  soft-tissue]
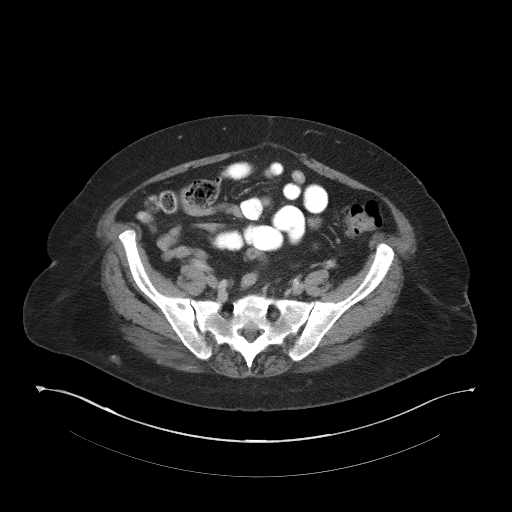
[im 39/88  soft-tissue]
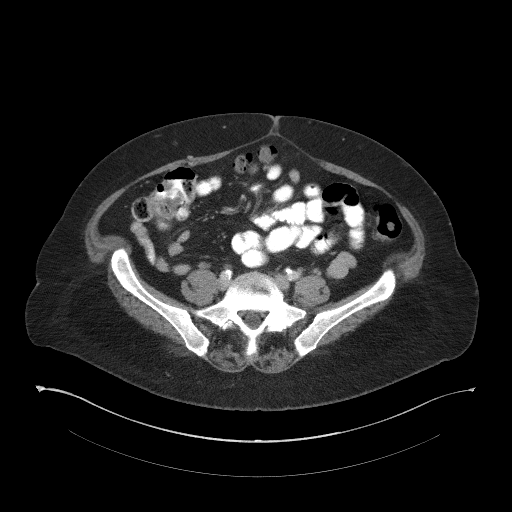
[im 49/88  soft-tissue]
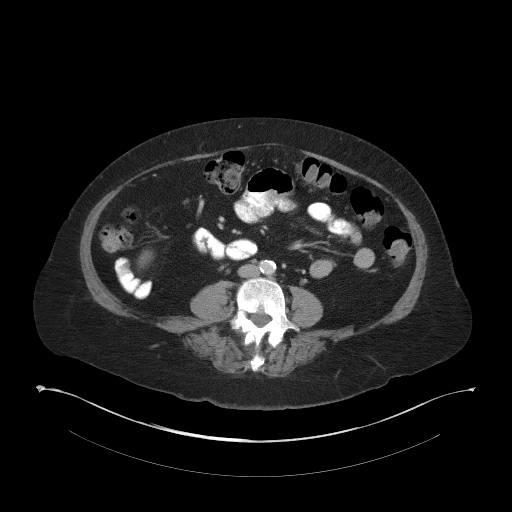
[im 54/88  soft-tissue]
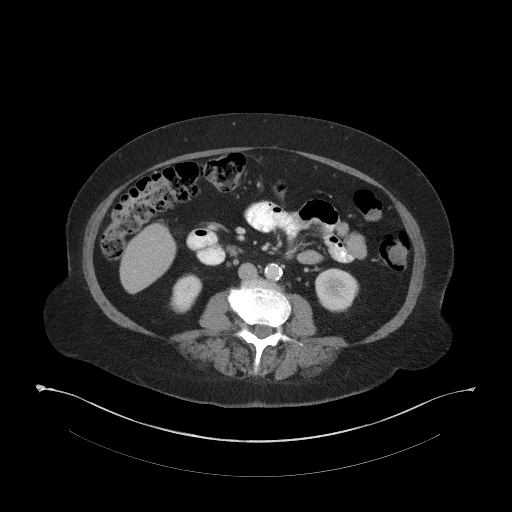
[im 63/88  soft-tissue]
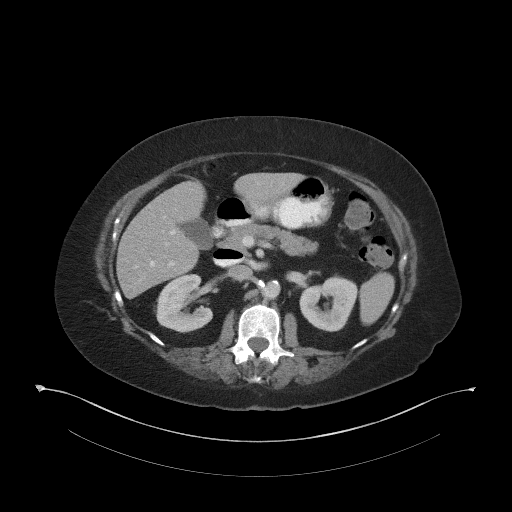
[im 63/88  bone]
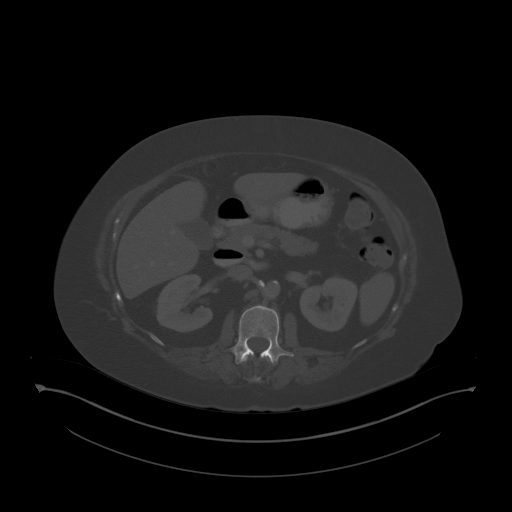
[im 68/88  soft-tissue]
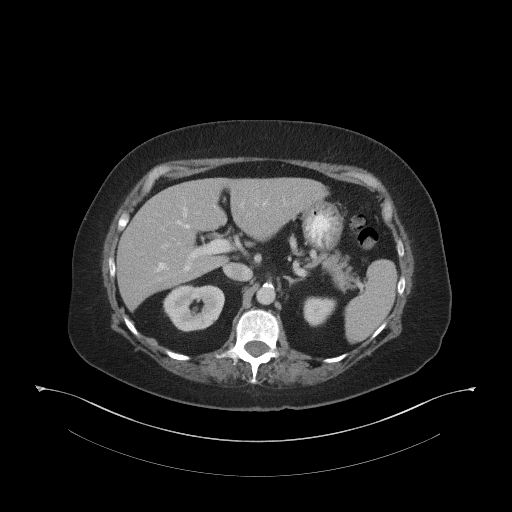
[im 73/88  soft-tissue]
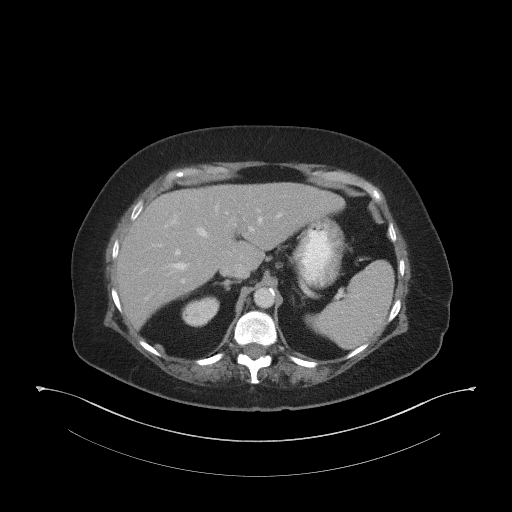
[im 83/88  soft-tissue]
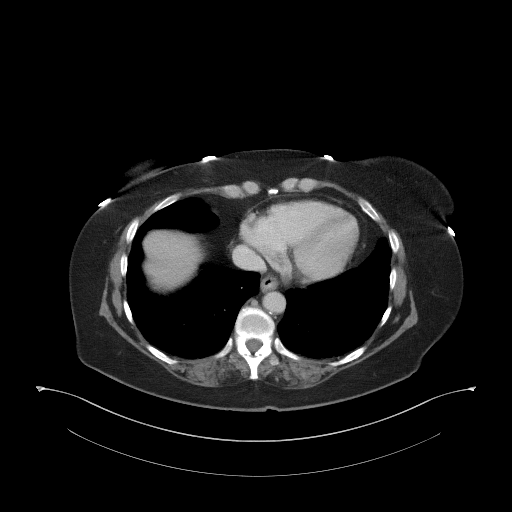

[Series 5: coronal st · coronal · 0.87mm/px · 3 of 91 slices shown]
[im 31/91  soft-tissue]
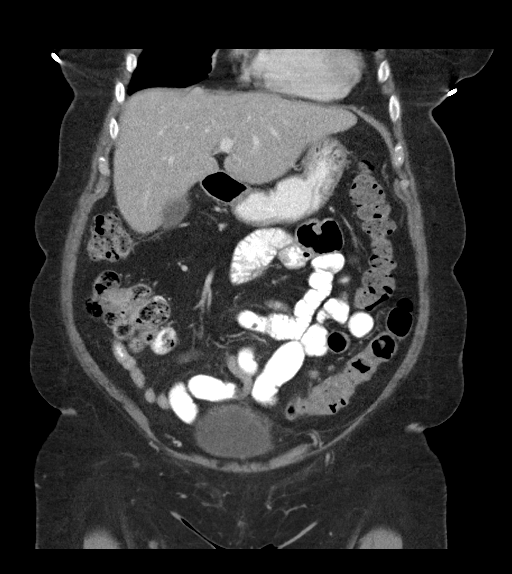
[im 41/91  soft-tissue]
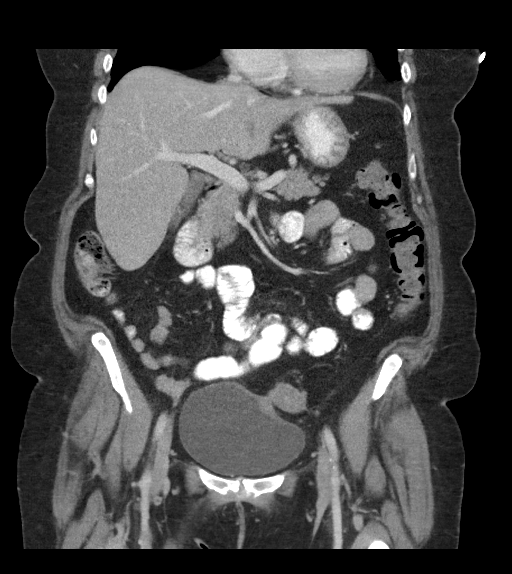
[im 51/91  soft-tissue]
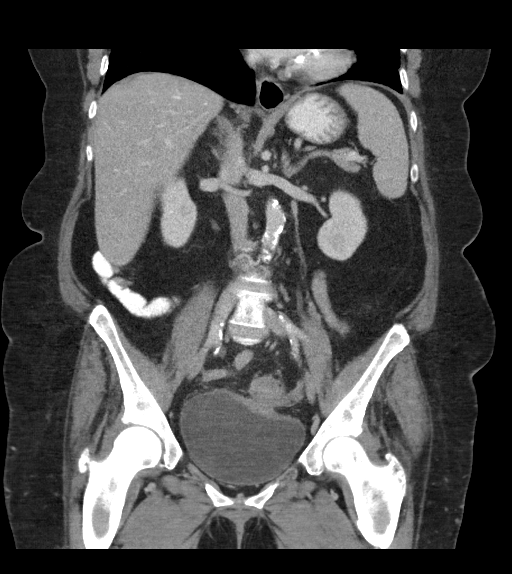

[15 of 46 positions shown; findings below may reference images not displayed]

FINDINGS: Lower chest:  Unremarkable.

Hepatobiliary: Tiny subcapsular low-density lesion lateral segment
left liver is unchanged compatible with a benign process. There is
no evidence for gallstones, gallbladder wall thickening, or
pericholecystic fluid. No intrahepatic or extrahepatic biliary
dilation.

Pancreas: No focal mass lesion. No dilatation of the main duct. No
intraparenchymal cyst. No peripancreatic edema.

Spleen: No splenomegaly. No focal mass lesion.

Adrenals/Urinary Tract: No adrenal nodule or mass. Kidneys are
unremarkable. No evidence for hydroureter. Marked thickening left
bladder wall noted.

Stomach/Bowel: Stomach is nondistended. No gastric wall thickening.
No evidence of outlet obstruction. Duodenum is normally positioned
as is the ligament of Treitz. Insert duodenal diverticulum No small
bowel wall thickening. No small bowel dilatation. The terminal ileum
is normal. The appendix is normal. Sigmoid colonic wall remains
thickened and ill-defined. In the interval since the prior study,
tethering of the bladder wall to the sigmoid colon has developed
(see image 43 of coronal series 5). This abnormal thickening has
associated fluid collection measuring 14 x 19 mm in diameter and
extending 4.2 cm and oblique craniocaudal length. This abnormal
fluid collection tracks posteriorly and inferiorly at the midline,
involving the posterior bladder wall and extending down to the level
of vaginal cuff.

Vascular/Lymphatic: There is abdominal aortic atherosclerosis
without aneurysm. There is no gastrohepatic or hepatoduodenal
ligament lymphadenopathy. No intraperitoneal or retroperitoneal
lymphadenopathy. No pelvic sidewall lymphadenopathy.

Reproductive: Uterus is surgically absent. The vaginal cuff is
tethered to the posterior wall of the bladder where the above
described bladder wall thickening and fluid collection are evident.

Other: No intraperitoneal free fluid.

Musculoskeletal: Bone windows reveal no worrisome lytic or sclerotic
osseous lesions.
IMPRESSION: 1. Interval progression of inflammatory changes in the pelvis with
sigmoid colon showing ill-defined wall thickening and now being
tethered to the bladder which has developed fairly marked asymmetric
bladder wall thickening. A rim enhancing fluid collection,
compatible with abscess, is either in the posterior bladder wall or
tracks immediately along the posterior bladder wall towards the
vaginal cuff. Although colovesical fistula cannot be excluded, there
is no evidence for gas in the urinary bladder. Imaging features also
raise the question of colovaginal fistula although the apparent
abscess terminates at the level of vaginal cuff with no air or fluid
seen in the vaginal lumen.
2.  Aortic Atherosclerois (RPOPV-170.0)

## 2017-10-07 MED ORDER — ACETAMINOPHEN 325 MG PO TABS
650.0000 mg | ORAL_TABLET | Freq: Four times a day (QID) | ORAL | Status: DC | PRN
  Administered 2017-10-09 – 2017-10-10 (×4): 650 mg via ORAL
  Filled 2017-10-07 (×4): qty 2

## 2017-10-07 MED ORDER — ONDANSETRON HCL 4 MG/2ML IJ SOLN
4.0000 mg | Freq: Four times a day (QID) | INTRAMUSCULAR | Status: DC | PRN
  Administered 2017-10-08: 4 mg via INTRAVENOUS
  Filled 2017-10-07: qty 2

## 2017-10-07 MED ORDER — MORPHINE SULFATE (PF) 4 MG/ML IV SOLN
4.0000 mg | Freq: Once | INTRAVENOUS | Status: AC
  Administered 2017-10-07: 4 mg via INTRAVENOUS
  Filled 2017-10-07: qty 1

## 2017-10-07 MED ORDER — ACETAMINOPHEN 650 MG RE SUPP
650.0000 mg | Freq: Four times a day (QID) | RECTAL | Status: DC | PRN
  Administered 2017-10-08: 650 mg via RECTAL
  Filled 2017-10-07: qty 1

## 2017-10-07 MED ORDER — SODIUM CHLORIDE 0.9 % IV BOLUS (SEPSIS)
500.0000 mL | Freq: Once | INTRAVENOUS | Status: AC
  Administered 2017-10-07: 500 mL via INTRAVENOUS

## 2017-10-07 MED ORDER — ONDANSETRON HCL 4 MG/2ML IJ SOLN
4.0000 mg | Freq: Once | INTRAMUSCULAR | Status: AC
  Administered 2017-10-07: 4 mg via INTRAVENOUS
  Filled 2017-10-07: qty 2

## 2017-10-07 MED ORDER — SODIUM CHLORIDE 0.9 % IV SOLN
INTRAVENOUS | Status: AC
  Administered 2017-10-07: 23:00:00 via INTRAVENOUS

## 2017-10-07 MED ORDER — POTASSIUM CHLORIDE 10 MEQ/100ML IV SOLN
10.0000 meq | INTRAVENOUS | Status: AC
  Administered 2017-10-07 – 2017-10-08 (×4): 10 meq via INTRAVENOUS
  Filled 2017-10-07 (×4): qty 100

## 2017-10-07 MED ORDER — ONDANSETRON HCL 4 MG PO TABS: 4.0000 mg | ORAL_TABLET | Freq: Four times a day (QID) | ORAL | Status: DC | PRN

## 2017-10-07 MED ORDER — IOPAMIDOL (ISOVUE-300) INJECTION 61%
100.0000 mL | Freq: Once | INTRAVENOUS | Status: AC | PRN
  Administered 2017-10-07: 100 mL via INTRAVENOUS

## 2017-10-07 MED ORDER — PIPERACILLIN-TAZOBACTAM 3.375 G IVPB 30 MIN
3.3750 g | Freq: Once | INTRAVENOUS | Status: AC
  Administered 2017-10-07: 3.375 g via INTRAVENOUS
  Filled 2017-10-07 (×2): qty 50

## 2017-10-07 MED ORDER — SODIUM CHLORIDE 0.9% FLUSH
3.0000 mL | Freq: Two times a day (BID) | INTRAVENOUS | Status: DC
  Administered 2017-10-08 – 2017-10-14 (×8): 3 mL via INTRAVENOUS

## 2017-10-07 MED ORDER — MORPHINE SULFATE (PF) 2 MG/ML IV SOLN
2.0000 mg | INTRAVENOUS | Status: DC | PRN
  Administered 2017-10-08: 2 mg via INTRAVENOUS
  Filled 2017-10-07: qty 1

## 2017-10-07 MED ORDER — PIPERACILLIN-TAZOBACTAM 3.375 G IVPB
3.3750 g | Freq: Three times a day (TID) | INTRAVENOUS | Status: DC
  Administered 2017-10-08 – 2017-10-12 (×14): 3.375 g via INTRAVENOUS
  Filled 2017-10-07 (×12): qty 50

## 2017-10-07 NOTE — Progress Notes (Signed)
Pharmacy Antibiotic Note  Morgan [name] is a 66 y.o. female admitted on 10/07/2017 with intra-abdominal infection.  Pharmacy has been consulted for Zosyn dosing. Afebrile, WBC 16.7, SCr 0.87.  Plan: Zosyn 3.375g IV (74min inf) x1; then 3.375g IV q8h (4h inf) Monitor clinical progress, c/s, renal function F/u de-escalation plan/LOT   Height: 5' 3.75" (161.9 cm) Weight: 190 lb (86.2 kg) IBW/kg (Calculated) : 54.13  Temp (24hrs), Avg:98.1 F (36.7 C), Min:98.1 F (36.7 C), Max:98.1 F (36.7 C)  Recent Labs  Lab 10/07/17 1646  WBC 16.7*  CREATININE 0.87    Estimated Creatinine Clearance: 65.4 mL/min (by C-G formula based on SCr of 0.87 mg/dL).    Allergies  Allergen Reactions  . Remicade [Infliximab] Swelling and Other (See Comments)    Facial swelling, throat was closing up.     Elicia Lamp, PharmD, BCPS Clinical Pharmacist 10/07/2017 7:01 PM

## 2017-10-07 NOTE — Telephone Encounter (Signed)
No additional recommendations, would recommend she proceed to the ER if she is in that much pain, will likely need repeat CT. If she can get to [facility] or [facility], we would be able to see her if admitted. Thanks-JLL

## 2017-10-07 NOTE — Telephone Encounter (Signed)
The pt has already arrived at [facility] point.  She will call if needed further

## 2017-10-07 NOTE — ED Provider Notes (Signed)
Emergency Department Provider Note   I have reviewed the triage vital signs and the nursing notes.   HISTORY  Chief Complaint Abdominal Pain   HPI Morgan [name] is a 66 y.o. female with PMH of diverticulitis with admission to [facility] in mid October and RA presents to the emergency department for evaluation suprapubic and left lower quadrant abdominal pain.  Symptoms been constant since discharge.  The patient was discharged home on 10 days of Cefdinir and Flagyl.  She has followed up with gastroenterology since her discharge and was prescribed additional pain medication.  She has a colonoscopy planned for December.  She continues to have lower abdominal discomfort with some pressure with defecation.  Denies dysuria, hesitancy, urgency.  No rectal pain.  No bleeding in the stools.  She has had some nausea but no vomiting.  No radiation of symptoms.  Patient called her gastroneurologist today and was referred to the emergency department.   Past Medical History:  Diagnosis Date  . Abnormal chest x-ray   . Abnormal electrocardiogram   . Allergic rhinitis   . Colon polyp    polypoid colorectal mucosa/adenomatous  . Contact dermatitis due to poison ivy   . Cystitis   . Diverticulosis of colon   . Fatty liver disease, nonalcoholic   . Gallbladder polyp   . GERD (gastroesophageal reflux disease)   . Hearing loss   . Hemorrhoids   . Hyperlipidemia   . Lymphocytosis   . Overweight(278.02)   . RA (rheumatoid arthritis) ([place])   . Venous insufficiency     Patient Active Problem List   Diagnosis Date Noted  . Pelvic abscess in female 10/07/2017  . Thrombocytopenia ([place]) 11/23/2013  . History of epistaxis 11/23/2013  . Skeletal pain 03/18/2013  . Lower abdominal pain 06/18/2012  . Diarrhea 06/18/2012  . Fever 06/18/2012  . FATTY LIVER DISEASE 11/24/2010  . DYSPHAGIA 12/26/2009  . Nonspecific (abnormal) findings on radiological and other examination of body structure 11/28/2009    . ABNORMAL ELECTROCARDIOGRAM 11/22/2009  . OVERWEIGHT 10/31/2008  . LYMPHOCYTOSIS 10/31/2008  . HEARING LOSS 10/31/2008  . COLONIC POLYPS 10/20/2008  . HYPERTENSION 10/20/2008  . DIVERTICULOSIS OF COLON 10/20/2008  . CYSTITIS 10/20/2008  . NECK PAIN 10/20/2008  . HYPERLIPIDEMIA 04/23/2008  . VENOUS INSUFFICIENCY 04/23/2008  . ALLERGIC RHINITIS 04/23/2008  . GERD 04/23/2008  . CONTACT DERMATITIS DUE TO POISON IVY 03/11/2008    Past Surgical History:  Procedure Laterality Date  . chemical and laser endovenous ablation of LE veins  2008, 2009, 2010, 2011   Dr Eilleen Kempf et al  . FOOT SURGERY     left  . INNER EAR SURGERY     childhood; right  . NASAL SEPTUM SURGERY    . TOTAL ABDOMINAL HYSTERECTOMY  12/86    Current Outpatient Rx  . Order #: [id number] Class: Historical Med  . Order #: [id number] Class: Historical Med  . Order #: [id number] Class: Historical Med  . Order #: [id number] Class: Print  . Order #: [id number] Class: Historical Med  . Order #: [id number] Class: Historical Med  . Order #: [id number] Class: Historical Med  . Order #: [id number] Class: Historical Med  . Order #: [id number] Class: Normal  . Order #: [id number] Class: Historical Med  . Order #: [id number] Class: Historical Med  . Order #: [id number] Class: Historical Med  . Order #: [id number] Class: Print    Allergies Remicade [infliximab]  Family History  Problem Relation Age of Onset  . Hyperlipidemia Mother   . Hypertension Mother   . Heart disease Sister  sister #1  . Breast cancer Other        half-sister  . Liver disease Sister        sister #1 - hx of liver transplant 14+ yrs ago  . Healthy Sister        sister #2  . Diabetes Maternal Uncle   . Heart failure Brother   . Diabetes Maternal Aunt        x 2  . Heart failure Son   . Colon cancer Neg Hx     Social History Social History   Tobacco Use  . Smoking status: Former Smoker    Packs/day: 0.50    Years: 15.00    Pack years: 7.50    Types:  Cigarettes    Last attempt to quit: 12/03/2001    Years since quitting: 15.8  . Smokeless tobacco: Never Used  Substance Use Topics  . Alcohol use: Yes    Comment: occasional  . Drug use: No    Review of Systems  Constitutional: No fever/chills Eyes: No visual changes. ENT: No sore throat. Cardiovascular: Denies chest pain. Respiratory: Denies shortness of breath. Gastrointestinal: LLQ and suprapubic abdominal abdominal pain. Positive nausea, no vomiting.  No diarrhea.  No constipation.  Genitourinary: Negative for dysuria. Musculoskeletal: Negative for back pain. Skin: Negative for rash. Neurological: Negative for headaches, focal weakness or numbness.  10-point ROS otherwise negative.  ____________________________________________   PHYSICAL EXAM:  VITAL SIGNS: ED Triage Vitals  Enc Vitals Group     BP 10/07/17 1545 126/65     Pulse Rate 10/07/17 1545 86     Resp 10/07/17 1545 16     Temp 10/07/17 1545 98.1 F (36.7 C)     Temp Source 10/07/17 1545 Oral     SpO2 10/07/17 1545 (!) 86 %     Weight 10/07/17 1540 190 lb (86.2 kg)     Height 10/07/17 1540 5' 3.75" (1.619 m)     Pain Score 10/07/17 1538 8   Constitutional: Alert and oriented. Well appearing and in no acute distress. Eyes: Conjunctivae are normal.  Head: Atraumatic. Nose: No congestion/rhinnorhea. Mouth/Throat: Mucous membranes are moist.  Neck: No stridor.  Cardiovascular: Normal rate, regular rhythm. Good peripheral circulation. Grossly normal heart sounds.   Respiratory: Normal respiratory effort.  No retractions. Lungs CTAB. Gastrointestinal: Soft with LLQ and suprapubic tenderness to palpation. No rebound or guarding. No distention.  Musculoskeletal: No lower extremity tenderness nor edema. No gross deformities of extremities. Neurologic:  Normal speech and language. No gross focal neurologic deficits are appreciated.  Skin:  Skin is warm, dry and intact. No rash  noted.  ____________________________________________   LABS (all labs ordered are listed, but only abnormal results are displayed)  Labs Reviewed  COMPREHENSIVE METABOLIC PANEL - Abnormal; Notable for the following components:      Result Value   Chloride 100 (*)    All other components within normal limits  CBC WITH DIFFERENTIAL/PLATELET - Abnormal; Notable for the following components:   WBC 16.7 (*)    Platelets 114 (*)    Neutro Abs 11.4 (*)    Monocytes Absolute 3.8 (*)    All other components within normal limits  URINALYSIS, ROUTINE W REFLEX MICROSCOPIC - Abnormal; Notable for the following components:   Hgb urine dipstick MODERATE (*)    Leukocytes, UA TRACE (*)    All other components within normal limits  URINALYSIS, MICROSCOPIC (REFLEX) - Abnormal; Notable for the following components:   Bacteria, UA FEW (*)  Squamous Epithelial / LPF 0-5 (*)    All other components within normal limits  URINE CULTURE  LIPASE, BLOOD   ____________________________________________  RADIOLOGY  Ct Abdomen Pelvis W Contrast  Result Date: 10/07/2017 CLINICAL DATA:  Low pelvic pain with a recent diagnosis of diverticulitis. EXAM: CT ABDOMEN AND PELVIS WITH CONTRAST TECHNIQUE: Multidetector CT imaging of the abdomen and pelvis was performed using the standard protocol following bolus administration of intravenous contrast. CONTRAST:  17mL ISOVUE-300 IOPAMIDOL (ISOVUE-300) INJECTION 61% COMPARISON:  06/20/2012 FINDINGS: Lower chest:  Unremarkable. Hepatobiliary: Tiny subcapsular low-density lesion lateral segment left liver is unchanged compatible with a benign process. There is no evidence for gallstones, gallbladder wall thickening, or pericholecystic fluid. No intrahepatic or extrahepatic biliary dilation. Pancreas: No focal mass lesion. No dilatation of the main duct. No intraparenchymal cyst. No peripancreatic edema. Spleen: No splenomegaly. No focal mass lesion. Adrenals/Urinary Tract:  No adrenal nodule or mass. Kidneys are unremarkable. No evidence for hydroureter. Marked thickening left bladder wall noted. Stomach/Bowel: Stomach is nondistended. No gastric wall thickening. No evidence of outlet obstruction. Duodenum is normally positioned as is the ligament of Treitz. Insert duodenal diverticulum No small bowel wall thickening. No small bowel dilatation. The terminal ileum is normal. The appendix is normal. Sigmoid colonic wall remains thickened and ill-defined. In the interval since the prior study, tethering of the bladder wall to the sigmoid colon has developed (see image 43 of coronal series 5). This abnormal thickening has associated fluid collection measuring 14 x 19 mm in diameter and extending 4.2 cm and oblique craniocaudal length. This abnormal fluid collection tracks posteriorly and inferiorly at the midline, involving the posterior bladder wall and extending down to the level of vaginal cuff. Vascular/Lymphatic: There is abdominal aortic atherosclerosis without aneurysm. There is no gastrohepatic or hepatoduodenal ligament lymphadenopathy. No intraperitoneal or retroperitoneal lymphadenopathy. No pelvic sidewall lymphadenopathy. Reproductive: Uterus is surgically absent. The vaginal cuff is tethered to the posterior wall of the bladder where the above described bladder wall thickening and fluid collection are evident. Other: No intraperitoneal free fluid. Musculoskeletal: Bone windows reveal no worrisome lytic or sclerotic osseous lesions. IMPRESSION: 1. Interval progression of inflammatory changes in the pelvis with sigmoid colon showing ill-defined wall thickening and now being tethered to the bladder which has developed fairly marked asymmetric bladder wall thickening. A rim enhancing fluid collection, compatible with abscess, is either in the posterior bladder wall or tracks immediately along the posterior bladder wall towards the vaginal cuff. Although colovesical fistula cannot  be excluded, there is no evidence for gas in the urinary bladder. Imaging features also raise the question of colovaginal fistula although the apparent abscess terminates at the level of vaginal cuff with no air or fluid seen in the vaginal lumen. 2.  Aortic Atherosclerois (ICD10-170.0) Electronically Signed   By: Misty Stanley M.D.   On: 10/07/2017 18:27    ____________________________________________   PROCEDURES  Procedure(s) performed:   Procedures  None ____________________________________________   INITIAL IMPRESSION / ASSESSMENT AND PLAN / ED COURSE  Pertinent labs & imaging results that were available during my care of the patient were reviewed by me and considered in my medical decision making (see chart for details).  Patient presents to the emergency department for evaluation of left lower quadrant suprapubic abdominal pain with associated nausea.  She has tenderness on exam.  No rebound or guarding.  She was recently treated for sigmoid diverticulitis with some surrounding bladder inflammation.  No obvious enterovesicular fistula identified on prior CT. Patient  has rheumatoid arthritis and this would have potential for complicated diverticulitis due to immune suppression.  Triage vital signs show oxygen saturation of 86% which I suspect is a false reading.  Patient has no dyspnea and is breathing comfortably.  No history of respiratory issues.   Discussed patient's case with Hospitalist, Dr. Roel Cluck to request admission. Patient and family (if present) updated with plan. Care transferred to Hospitalist service.  I reviewed all nursing notes, vitals, pertinent old records, EKGs, labs, imaging (as available).  07:31 PM Spoke with Dr. Lovena Neighbours with Urology. Agrees with abx and advises bladder decompression overnight with foley. He will consult in the AM.   ____________________________________________  FINAL CLINICAL IMPRESSION(S) / ED DIAGNOSES  Final diagnoses:  Left lower  quadrant abdominal abscess ([place])     MEDICATIONS GIVEN DURING THIS VISIT:  Medications  piperacillin-tazobactam (ZOSYN) IVPB 3.375 g (3.375 g Intravenous New Bag/Given 10/07/17 1923)  piperacillin-tazobactam (ZOSYN) IVPB 3.375 g (not administered)  morphine 4 MG/ML injection 4 mg (4 mg Intravenous Given 10/07/17 1651)  ondansetron (ZOFRAN) injection 4 mg (4 mg Intravenous Given 10/07/17 1650)  sodium chloride 0.9 % bolus 500 mL (0 mLs Intravenous Stopped 10/07/17 1911)  iopamidol (ISOVUE-300) 61 % injection 100 mL (100 mLs Intravenous Contrast Given 10/07/17 1743)    Note:  This document was prepared using Dragon voice recognition software and may include unintentional dictation errors.  Nanda Quinton, MD Emergency Medicine    Aryona Sill, [facility], MD 10/07/17 [phone]

## 2017-10-07 NOTE — H&P (Signed)
Morgan [name] OYD:[id number] DOB: 11-21-1951 DOA: 10/07/2017     PCP: [name], MD   Outpatient Specialists: Kathrine Cords Patient coming from:   home Lives  With family   Chief Complaint: Severe abdominal pain  HPI: Morgan [name] is a 66 y.o. female with medical history significant of recent diverticulitis history rheumatoid arthritis, fatty liver disease, GERD, HLD    Presented with severe abdominal pain/suprapubic. Soft stools for past 6 days but no blood in stools denies any fevers no nausea or vomiting. She continued to have lingering left lower quadrant pain that has progressed. She did not notice any blood in his stools  Recently admitted to know found on October 14 for diverticulitis CT of abdomen without contrast at that time showed acute sigmoid diverticulitis and significant adherent bladder wall with her risk of developing colovesicular fistula. At that time no gas was noted on bladder was treated with IV antibiotics 3 days discharged on Omnicef and Flagyl for 10 days and finished that on 24th of October. He continued to have abdominal discomfort. Denies stool in urine no air in urine, no air or stool vaginally.  Regarding pertinent Chronic problems: 2 of visits and 30 colitis one treated as an outpatient years ago in the last recently in October 2018   IN ER:  Temp (24hrs), Avg:98.2 F (36.8 C), Min:98.1 F (36.7 C), Max:98.2 F (36.8 C)      on arrival  ED Triage Vitals  Enc Vitals Group     BP 10/07/17 1545 126/65     Pulse Rate 10/07/17 1545 86     Resp 10/07/17 1545 16     Temp 10/07/17 1545 98.1 F (36.7 C)     Temp Source 10/07/17 1545 Oral     SpO2 10/07/17 1545 (!) 86 %     Weight 10/07/17 1540 190 lb (86.2 kg)     Height 10/07/17 1540 5' 3.75" (1.619 m)     Head Circumference --      Peak Flow --      Pain Score 10/07/17 1538 8     Pain Loc --      Pain Edu? --      Excl. in [place]? --     Latest  RR 17 99% BP 124/54 Na 136 K 3.5  WBC 16.7  Hg 12.3 CT of abdomen showing interval progression of inflammatory changes in the pelvis and sigmoid colon wall thickening not tethered to the bladder remained hence he fluid collection compatible with abscess still no evidence of gas in the urinary bladder.  Following Medications were ordered in ER: Medications  piperacillin-tazobactam (ZOSYN) IVPB 3.375 g (not administered)  morphine 4 MG/ML injection 4 mg (4 mg Intravenous Given 10/07/17 1651)  ondansetron (ZOFRAN) injection 4 mg (4 mg Intravenous Given 10/07/17 1650)  sodium chloride 0.9 % bolus 500 mL (0 mLs Intravenous Stopped 10/07/17 1911)  iopamidol (ISOVUE-300) 61 % injection 100 mL (100 mLs Intravenous Contrast Given 10/07/17 1743)  piperacillin-tazobactam (ZOSYN) IVPB 3.375 g (0 g Intravenous Stopped 10/07/17 1946)  morphine 4 MG/ML injection 4 mg (4 mg Intravenous Given 10/07/17 2010)     ER provider discussed case with:  Urology Dr. Lovena Neighbours Who recommends: Bladder decompression overnight with Foley We'll see patient in consult in the morning    Hospitalist was called for admission for retro-vesicular versus pericolonic abscess  Review of Systems:    Pertinent positives include: fatigue, abdominal pain  Constitutional:  No weight loss, night  sweats, Fevers, chills,  weight loss  HEENT:  No headaches, Difficulty swallowing,Tooth/dental problems,Sore throat,  No sneezing, itching, ear ache, nasal congestion, post nasal drip,  Cardio-vascular:  No chest pain, Orthopnea, PND, anasarca, dizziness, palpitations.no Bilateral lower extremity swelling  GI:  No heartburn, indigestion, , nausea, vomiting, diarrhea, change in bowel habits, loss of appetite, melena, blood in stool, hematemesis Resp:  no shortness of breath at rest. No dyspnea on exertion, No excess mucus, no productive cough, No non-productive cough, No coughing up of blood.No change in color of mucus.No wheezing. Skin:  no rash or lesions. No jaundice GU:  no  dysuria, change in color of urine, no urgency or frequency. No straining to urinate.  No flank pain.  Musculoskeletal:  No joint pain or no joint swelling. No decreased range of motion. No back pain.  Psych:  No change in mood or affect. No depression or anxiety. No memory loss.  Neuro: no localizing neurological complaints, no tingling, no weakness, no double vision, no gait abnormality, no slurred speech, no confusion  As per HPI otherwise 10 point review of systems negative.   Past Medical History: Past Medical History:  Diagnosis Date  . Abnormal chest x-ray   . Abnormal electrocardiogram   . Allergic rhinitis   . Colon polyp    polypoid colorectal mucosa/adenomatous  . Contact dermatitis due to poison ivy   . Cystitis   . Diverticulosis of colon   . Fatty liver disease, nonalcoholic   . Gallbladder polyp   . GERD (gastroesophageal reflux disease)   . Hearing loss   . Hemorrhoids   . Hyperlipidemia   . Lymphocytosis   . Overweight(278.02)   . RA (rheumatoid arthritis) ([place])   . Venous insufficiency    Past Surgical History:  Procedure Laterality Date  . chemical and laser endovenous ablation of LE veins  2008, 2009, 2010, 2011   Dr Eilleen Kempf et al  . FOOT SURGERY     left  . INNER EAR SURGERY     childhood; right  . NASAL SEPTUM SURGERY    . TOTAL ABDOMINAL HYSTERECTOMY  12/86     Social History:  Ambulatory    independently     reports that she quit smoking about 15 years ago. Her smoking use included cigarettes. She has a 7.50 pack-year smoking history. she has never used smokeless tobacco. She reports that she drinks alcohol. She reports that she does not use drugs.  Allergies:   Allergies  Allergen Reactions  . Remicade [Infliximab] Swelling and Other (See Comments)    Facial swelling, throat was closing up.        Family History:   Family History  Problem Relation Age of Onset  . Hyperlipidemia Mother   . Hypertension Mother   . Heart disease  Sister        sister #1  . Breast cancer Other        half-sister  . Liver disease Sister        sister #1 - hx of liver transplant 14+ yrs ago  . Healthy Sister        sister #2  . Diabetes Maternal Uncle   . Heart failure Brother   . Diabetes Maternal Aunt        x 2  . Heart failure Son   . Colon cancer Neg Hx     Medications: Prior to Admission medications   Medication Sig Start Date End Date Taking? Authorizing Provider  acetaminophen (TYLENOL) 500  MG tablet Take 500 mg by mouth every 6 (six) hours as needed.   Yes [provider]  aspirin 81 MG tablet Take 81 mg by mouth daily. Takes 3 times weekly due to bruising   Yes [provider]  calcium carbonate (OS-CAL) 600 MG TABS Take 2 tablets by mouth daily.     Yes [provider]  fenofibrate 160 MG tablet Take 1 tablet (160 mg total) by mouth daily. 11/23/13  Yes Noralee Space, MD  fexofenadine (ALLEGRA) 180 MG tablet Take 180 mg by mouth daily.   Yes [provider]  golimumab (SIMPONI ARIA) 50 MG/4ML SOLN injection Inject 50 mg into the vein every 8 (eight) weeks.    Yes [provider]  hydrochlorothiazide (HYDRODIURIL) 25 MG tablet Take 25 mg by mouth daily.   Yes [provider]  hydroxychloroquine (PLAQUENIL) 200 MG tablet Take 400 mg by mouth daily.   Yes [provider]  hyoscyamine (LEVSIN SL) 0.125 MG SL tablet Take 1 tablet (0.125 mg total) by mouth every 6 (six) hours as needed. 09/30/17  Yes Levin Erp, PA  Multiple Vitamin (MULTIVITAMIN) tablet Take 1 tablet by mouth daily.     Yes [provider]  polyethylene glycol (MIRALAX / GLYCOLAX) packet Take 1 packet daily as needed by mouth. 09/19/17  Yes [provider]  predniSONE (DELTASONE) 5 MG tablet Take 5 mg by mouth daily. 08/24/14  Yes [provider]  Probiotic Product (PROBIOTIC PO) Take by mouth daily.   Yes [provider]  simvastatin (ZOCOR) 40 MG  tablet Take 1 tablet (40 mg total) by mouth at bedtime. 11/23/13  Yes Noralee Space, MD    Physical Exam: Patient Vitals for the past 24 hrs:  BP Temp Temp src Pulse Resp SpO2 Height Weight  10/07/17 2111 (!) 106/55 98.2 F (36.8 C) Oral 73 16 99 % - -  10/07/17 2013 (!) 124/54 - - 76 17 99 % - -  10/07/17 1843 122/63 - - 74 16 99 % - -  10/07/17 1545 126/65 98.1 F (36.7 C) Oral 86 16 (!) 86 % - -  10/07/17 1540 - - - - - - 5' 3.75" (1.619 m) 86.2 kg (190 lb)    1. General:  in No Acute distress  well  -appearing 2. Psychological: Alert and  Oriented 3. Head/ENT:    Dry Mucous Membranes                          Head Non traumatic, neck supple                           Poor Dentition 4. SKIN: decreased Skin turgor,  Skin clean Dry and intact no rash 5. Heart: Regular rate and rhythm no  Murmur, no Rub or gallop 6. Lungs: no wheezes or crackles   7. Abdomen: Soft, mild left lower quadrant -tender, Non distended   obese bowel sounds present 8. Lower extremities: no clubbing, cyanosis, or edema 9. Neurologically Grossly intact, moving all 4 extremities equally  10. MSK: Normal range of motion   body mass index is 32.87 kg/m.  Labs on Admission:   Labs on Admission: I have personally reviewed following labs and imaging studies  CBC: Recent Labs  Lab 10/07/17 1646  WBC 16.7*  NEUTROABS 11.4*  HGB 12.3  HCT 38.3  MCV 91.4  PLT 114*  Basic Metabolic Panel: Recent Labs  Lab 10/07/17 1646  NA 136  K 3.5  CL 100*  CO2 29  GLUCOSE 99  BUN 15  CREATININE 0.87  CALCIUM 9.7   GFR: Estimated Creatinine Clearance: 65.4 mL/min (by C-G formula based on SCr of 0.87 mg/dL). Liver Function Tests: Recent Labs  Lab 10/07/17 1646  AST 23  ALT 17  ALKPHOS 49  BILITOT 0.3  PROT 7.5  ALBUMIN 3.7   Recent Labs  Lab 10/07/17 1646  LIPASE 28   No results for input(s): AMMONIA in the last 168 hours. Coagulation Profile: No results for input(s): INR, PROTIME in the  last 168 hours. Cardiac Enzymes: No results for input(s): CKTOTAL, CKMB, CKMBINDEX, TROPONINI in the last 168 hours. BNP (last 3 results) No results for input(s): PROBNP in the last 8760 hours. HbA1C: No results for input(s): HGBA1C in the last 72 hours. CBG: No results for input(s): GLUCAP in the last 168 hours. Lipid Profile: No results for input(s): CHOL, HDL, LDLCALC, TRIG, CHOLHDL, LDLDIRECT in the last 72 hours. Thyroid Function Tests: No results for input(s): TSH, T4TOTAL, FREET4, T3FREE, THYROIDAB in the last 72 hours. Anemia Panel: No results for input(s): VITAMINB12, FOLATE, FERRITIN, TIBC, IRON, RETICCTPCT in the last 72 hours. Urine analysis:    Component Value Date/Time   COLORURINE YELLOW 10/07/2017 [place] 10/07/2017 1558   LABSPEC 1.010 10/07/2017 1558   PHURINE 6.5 10/07/2017 1558   GLUCOSEU NEGATIVE 10/07/2017 1558   GLUCOSEU NEGATIVE 01/13/2013 0842   HGBUR MODERATE (A) 10/07/2017 1558   BILIRUBINUR NEGATIVE 10/07/2017 1558   KETONESUR NEGATIVE 10/07/2017 1558   PROTEINUR NEGATIVE 10/07/2017 1558   UROBILINOGEN 0.2 01/13/2013 0842   NITRITE NEGATIVE 10/07/2017 1558   LEUKOCYTESUR TRACE (A) 10/07/2017 1558   Sepsis Labs: @LABRCNTIP (procalcitonin:4,lacticidven:4) )No results found for this or any previous visit (from the past 240 hour(s)).    UA no evidence of UTI     No results found for: HGBA1C  Estimated Creatinine Clearance: 65.4 mL/min (by C-G formula based on SCr of 0.87 mg/dL).  BNP (last 3 results) No results for input(s): PROBNP in the last 8760 hours.   ECG REPORT Not obtained  Filed Weights   10/07/17 1540  Weight: 86.2 kg (190 lb)     Cultures: No results found for: SDES, SPECREQUEST, CULT, REPTSTATUS   Radiological Exams on Admission: Ct Abdomen Pelvis W Contrast  Result Date: 10/07/2017 CLINICAL DATA:  Low pelvic pain with a recent diagnosis of diverticulitis. EXAM: CT ABDOMEN AND PELVIS WITH CONTRAST  TECHNIQUE: Multidetector CT imaging of the abdomen and pelvis was performed using the standard protocol following bolus administration of intravenous contrast. CONTRAST:  165mL ISOVUE-300 IOPAMIDOL (ISOVUE-300) INJECTION 61% COMPARISON:  06/20/2012 FINDINGS: Lower chest:  Unremarkable. Hepatobiliary: Tiny subcapsular low-density lesion lateral segment left liver is unchanged compatible with a benign process. There is no evidence for gallstones, gallbladder wall thickening, or pericholecystic fluid. No intrahepatic or extrahepatic biliary dilation. Pancreas: No focal mass lesion. No dilatation of the main duct. No intraparenchymal cyst. No peripancreatic edema. Spleen: No splenomegaly. No focal mass lesion. Adrenals/Urinary Tract: No adrenal nodule or mass. Kidneys are unremarkable. No evidence for hydroureter. Marked thickening left bladder wall noted. Stomach/Bowel: Stomach is nondistended. No gastric wall thickening. No evidence of outlet obstruction. Duodenum is normally positioned as is the ligament of Treitz. Insert duodenal diverticulum No small bowel wall thickening. No small bowel dilatation. The terminal ileum is normal. The appendix is normal. Sigmoid colonic wall remains  thickened and ill-defined. In the interval since the prior study, tethering of the bladder wall to the sigmoid colon has developed (see image 43 of coronal series 5). This abnormal thickening has associated fluid collection measuring 14 x 19 mm in diameter and extending 4.2 cm and oblique craniocaudal length. This abnormal fluid collection tracks posteriorly and inferiorly at the midline, involving the posterior bladder wall and extending down to the level of vaginal cuff. Vascular/Lymphatic: There is abdominal aortic atherosclerosis without aneurysm. There is no gastrohepatic or hepatoduodenal ligament lymphadenopathy. No intraperitoneal or retroperitoneal lymphadenopathy. No pelvic sidewall lymphadenopathy. Reproductive: Uterus is  surgically absent. The vaginal cuff is tethered to the posterior wall of the bladder where the above described bladder wall thickening and fluid collection are evident. Other: No intraperitoneal free fluid. Musculoskeletal: Bone windows reveal no worrisome lytic or sclerotic osseous lesions. IMPRESSION: 1. Interval progression of inflammatory changes in the pelvis with sigmoid colon showing ill-defined wall thickening and now being tethered to the bladder which has developed fairly marked asymmetric bladder wall thickening. A rim enhancing fluid collection, compatible with abscess, is either in the posterior bladder wall or tracks immediately along the posterior bladder wall towards the vaginal cuff. Although colovesical fistula cannot be excluded, there is no evidence for gas in the urinary bladder. Imaging features also raise the question of colovaginal fistula although the apparent abscess terminates at the level of vaginal cuff with no air or fluid seen in the vaginal lumen. 2.  Aortic Atherosclerois (ICD10-170.0) Electronically Signed   By: Misty Stanley M.D.   On: 10/07/2017 18:27    Chart has been reviewed    Assessment/Plan  66 y.o. female with medical history significant of recent diverticulitis history rheumatoid arthritis , fatty liver disease, GERD, HLD Admitted for retro-vesicular versus pericolonic abscess  Present on Admission: . Pelvic abscess in female - suspect abscess secondary to recent diverticulitis retrovesicular appreciate urology consult this point clinically no evidence of enterovesicular fistula the patient continues to be at high risk. Continue IV antibiotics IV Zosyn for broad coverage. Keep nothing by mouth. Order Foley catheter as per urology recommendations to decompress bladder . Essential hypertension hold home medications wound by mouth permissive hypertension given some soft blood pressure  . Hyperlipemia - hold home medications while nothing by mouth . Fatty liver  chronic currently stable check cementing a.m. Hx of RA on chronic steroids 5mg  a day while NPO wil replace with IV to avoid adrenal insufficiency Hold plaquenil fr now while NPO Hypokalemia - will replace, check mag  Other plan as per orders.  DVT prophylaxis:  SCD    Code Status:  FULL CODE  as per patient    Family Communication:   Family not at  Bedside   Disposition Plan:  To home once workup is complete and patient is stable                                                Consults called: UROLOgy  Admission status:   inpatient   Level of care  medical floor      I have spent a total of 56 min on this admission    Coletta Lockner 10/08/2017, 12:22 AM    [facility]  Pager [phone]   after 2 AM please page floor coverage PA If 7AM-7PM, please contact the day team taking care of the  patient  [email or web address]  Password [email or web address]

## 2017-10-07 NOTE — Telephone Encounter (Signed)
Patient states that she is going to [employer] [phone]. Patient is still requesting a callback from one of our nurses.

## 2017-10-07 NOTE — Plan of Care (Signed)
49yoF w recent diverticulitis, recurrent left lower quadrant pain, CT showed abscess ALONG POSTERIOR BLADDER WBC 16  Started on Zosyn  Hemodynamically stable requested consult to Urology given possible Bladder involment.  Kezia Benevides 7:24 PM

## 2017-10-07 NOTE — Telephone Encounter (Signed)
The pt has been "doubled over in pain"  She is following the recommended diet.  Very lower left side abd pain 8/10.  She is taking hyoscyamine every 4 hours that only eases some of the pain.  Soft stools 6 episodes today.  No blood in BM, No fever, nausea and dizziness.  Montez Hageman on 09/30/17:    Assessment: 1.  Diverticulitis: diagnosed via CT during recent hospitalization, finished 2 days of IV antibiotics and 10 days total of antibiotics as an outpatient, second episode of diverticulitis in the past 5 years, patient is already scheduled for screening colonoscopy in December, feeling much improved at this time 2.  Left lower quadrant pain: Lingering after above  The pt states she has already called her husband to take her to [place] because the pain is only getting worse.  Any further recommendations.

## 2017-10-07 NOTE — ED Triage Notes (Signed)
Abdominal pain. Nausea. Lightheaded. She was admitted at [facility] October 14th for same and discharged October 18. She was treated for a UTI and diverticulitis while admitted.

## 2017-10-08 ENCOUNTER — Inpatient Hospital Stay (HOSPITAL_COMMUNITY): Payer: BLUE CROSS/BLUE SHIELD

## 2017-10-08 ENCOUNTER — Encounter (HOSPITAL_COMMUNITY): Payer: Self-pay | Admitting: Internal Medicine

## 2017-10-08 DIAGNOSIS — K651 Peritoneal abscess: Secondary | ICD-10-CM | POA: Insufficient documentation

## 2017-10-08 DIAGNOSIS — Z79899 Other long term (current) drug therapy: Secondary | ICD-10-CM

## 2017-10-08 DIAGNOSIS — K5720 Diverticulitis of large intestine with perforation and abscess without bleeding: Secondary | ICD-10-CM

## 2017-10-08 DIAGNOSIS — K5792 Diverticulitis of intestine, part unspecified, without perforation or abscess without bleeding: Secondary | ICD-10-CM | POA: Diagnosis present

## 2017-10-08 DIAGNOSIS — D849 Immunodeficiency, unspecified: Secondary | ICD-10-CM

## 2017-10-08 DIAGNOSIS — D899 Disorder involving the immune mechanism, unspecified: Secondary | ICD-10-CM

## 2017-10-08 LAB — CBC
HCT: 34.9 % — ABNORMAL LOW (ref 36.0–46.0)
Hemoglobin: 11.3 g/dL — ABNORMAL LOW (ref 12.0–15.0)
MCH: 29.7 pg (ref 26.0–34.0)
MCHC: 32.4 g/dL (ref 30.0–36.0)
MCV: 91.8 fL (ref 78.0–100.0)
PLATELETS: 99 10*3/uL — AB (ref 150–400)
RBC: 3.8 MIL/uL — AB (ref 3.87–5.11)
RDW: 15.6 % — ABNORMAL HIGH (ref 11.5–15.5)
WBC: 13.1 10*3/uL — AB (ref 4.0–10.5)

## 2017-10-08 LAB — COMPREHENSIVE METABOLIC PANEL
ALK PHOS: 42 U/L (ref 38–126)
ALT: 16 U/L (ref 14–54)
AST: 20 U/L (ref 15–41)
Albumin: 3.4 g/dL — ABNORMAL LOW (ref 3.5–5.0)
Anion gap: 10 (ref 5–15)
BILIRUBIN TOTAL: 0.5 mg/dL (ref 0.3–1.2)
BUN: 10 mg/dL (ref 6–20)
CALCIUM: 8.9 mg/dL (ref 8.9–10.3)
CO2: 26 mmol/L (ref 22–32)
CREATININE: 0.72 mg/dL (ref 0.44–1.00)
Chloride: 105 mmol/L (ref 101–111)
Glucose, Bld: 88 mg/dL (ref 65–99)
Potassium: 3.7 mmol/L (ref 3.5–5.1)
Sodium: 141 mmol/L (ref 135–145)
Total Protein: 6.6 g/dL (ref 6.5–8.1)

## 2017-10-08 LAB — TSH: TSH: 2.781 u[IU]/mL (ref 0.350–4.500)

## 2017-10-08 LAB — PHOSPHORUS: Phosphorus: 3.4 mg/dL (ref 2.5–4.6)

## 2017-10-08 LAB — MAGNESIUM: Magnesium: 1.8 mg/dL (ref 1.7–2.4)

## 2017-10-08 MED ORDER — METHYLPREDNISOLONE SODIUM SUCC 40 MG IJ SOLR: 10.0000 mg | Freq: Every day | INTRAMUSCULAR | Status: DC

## 2017-10-08 MED ORDER — SODIUM CHLORIDE 0.9 % IV SOLN
INTRAVENOUS | Status: AC
  Administered 2017-10-08 – 2017-10-09 (×2): via INTRAVENOUS

## 2017-10-08 MED ORDER — SODIUM CHLORIDE 0.9 % IV SOLN
INTRAVENOUS | Status: AC
  Filled 2017-10-08: qty 1000

## 2017-10-08 MED ORDER — METHYLPREDNISOLONE SODIUM SUCC 40 MG IJ SOLR
5.0000 mg | Freq: Every day | INTRAMUSCULAR | Status: DC
  Administered 2017-10-09 – 2017-10-10 (×2): 5.2 mg via INTRAVENOUS
  Filled 2017-10-08 (×2): qty 1

## 2017-10-08 MED ORDER — METHYLPREDNISOLONE SODIUM SUCC 40 MG IJ SOLR
4.0000 mg | Freq: Every day | INTRAMUSCULAR | Status: DC
  Administered 2017-10-08: 4 mg via INTRAVENOUS
  Filled 2017-10-08: qty 1

## 2017-10-08 MED ORDER — IOPAMIDOL (ISOVUE-300) INJECTION 61%
100.0000 mL | Freq: Once | INTRAVENOUS | Status: AC | PRN
  Administered 2017-10-08: 50 mL via INTRAVENOUS

## 2017-10-08 MED ORDER — IOPAMIDOL (ISOVUE-300) INJECTION 61%
INTRAVENOUS | Status: AC
  Filled 2017-10-08: qty 100

## 2017-10-08 MED ORDER — LIP MEDEX EX OINT
TOPICAL_OINTMENT | CUTANEOUS | Status: AC
  Filled 2017-10-08: qty 7

## 2017-10-08 MED ORDER — IOPAMIDOL (ISOVUE-300) INJECTION 61%
INTRAVENOUS | Status: AC
  Filled 2017-10-08: qty 75

## 2017-10-08 MED ORDER — IOPAMIDOL (ISOVUE-300) INJECTION 61%
75.0000 mL | Freq: Once | INTRAVENOUS | Status: AC | PRN
  Administered 2017-10-08: 75 mL via INTRAVENOUS

## 2017-10-08 NOTE — Progress Notes (Signed)
PROGRESS NOTE  Morgan [name]  RWE:[id number] DOB: [birth year] DOA: 10/07/2017 PCP: [name], MD  Outpatient Specialists: GI, [name] Brief Narrative: Morgan [name] is a 66 y.o. female with a history of RA and recent admission for diverticulitis who presented to the ED for worsening lower abdominal pain. She describes constant, waxing/waning, worsening abdominal pain in the suprapubic area and LLQ radiating to the lower back, worse with defecation, associated with urinary urgency without dysuria, hematuria, pneumaturia. She was discharged 10/18 on cefdinir and flagyl, followed up with GI 10/29 and required additional pain medication but appeared in no distress and was sent home with return precautions with colonoscopy planned Dec 19. Due to continued, worsening symptoms she called the office 11/5 and was directed to the ED where CT demonstrated sigmoid wall thickening with tethering to the bladder wall and enhancing fluid collection terminating at the level of the vaginal cuff. No air seen in bladder or vaginal lumen. WBC 16.7, UA without evidence of infection, no RBCs or WBCs on micro. IV antibiotics were given and she was admitted.   Assessment & Plan: Active Problems:   Hyperlipemia   Essential hypertension   Fatty liver   Pelvic abscess in female  Recurrent diverticulitis with abscess: Size not seemingly sufficient for drainage. No definite evidence of fistula formation at this time.  - Continue IV abx - Appreciate GI and general surgery recommendations.  - NPO pending evaluations today.  - Appreciate urology evaluation, ?need continued foley.  - IV morhpine, zofran prns - Monitor CBC  Rheumatoid arthritis: On golimumab, last infusion was held, plaquenil and chronic prednisone 5mg .  - Continue IV steroids while NPO, no indication for stress dosing currently - Holding immunosuppressants.   Essential HTN:  - Monitor off BP medications for now with soft pressures. Restart when  taking po or if becomes further elevated.   Hyperlipidemia:  - Hold home medication for now  Hypokalemia: Resolved.   Thrombocytopenia: Chronic, stable.  - Monitor  DVT prophylaxis: SCDs Code Status: Full Family Communication: None at bedside Disposition Plan: Continue inpatient management, anticipate eventual DC back to home environment  Consultants:   Urology  [facility]  General Surgery  Procedures:   Foley 11/5 >>   Antimicrobials:  Zosyn 11/5 >>   Subjective: Abdominal pain controlled since admission, denies any vaginal bleeding or discharge. No rectal bleeding. No fever.   Objective: Vitals:   10/07/17 2013 10/07/17 2111 10/08/17 0143 10/08/17 0559  BP: (!) 124/54 (!) 106/55 (!) 119/48 (!) 126/53  Pulse: 76 73 65 62  Resp: 17 16 16 16   Temp:  98.2 F (36.8 C) 97.9 F (36.6 C) 98.3 F (36.8 C)  TempSrc:  Oral Oral Oral  SpO2: 99% 99% 99% 99%  Weight:      Height:        Intake/Output Summary (Last 24 hours) at 10/08/2017 0842 Last data filed at 10/08/2017 0600 Gross per 24 hour  Intake 1338.33 ml  Output 2400 ml  Net -1061.67 ml   Filed Weights   10/07/17 1540  Weight: 86.2 kg (190 lb)    Gen: 66 y.o. female in no distress  Pulm: Non-labored breathing room air. Clear to auscultation bilaterally.  CV: Regular rate and rhythm. No murmur, rub, or gallop. No JVD, no pedal edema. GI: Abdomen soft, TTP in lower, LLQ without rebound or guarding. Nondistended, +BS.   Ext: Warm, no deformities Skin: No rashes, lesions no ulcers Neuro: Alert and oriented. No focal neurological deficits.  Psych: Judgement and insight appear normal. Mood & affect appropriate.   Data Reviewed: I have personally reviewed following labs and imaging studies  CBC: Recent Labs  Lab 10/07/17 1646 10/08/17 0602  WBC 16.7* 13.1*  NEUTROABS 11.4*  --   HGB 12.3 11.3*  HCT 38.3 34.9*  MCV 91.4 91.8  PLT 114* 99*   Basic Metabolic Panel: Recent Labs  Lab  10/07/17 1646 10/08/17 0602  NA 136 141  K 3.5 3.7  CL 100* 105  CO2 29 26  GLUCOSE 99 88  BUN 15 10  CREATININE 0.87 0.72  CALCIUM 9.7 8.9  MG  --  1.8  PHOS  --  3.4   GFR: Estimated Creatinine Clearance: 71.1 mL/min (by C-G formula based on SCr of 0.72 mg/dL). Liver Function Tests: Recent Labs  Lab 10/07/17 1646 10/08/17 0602  AST 23 20  ALT 17 16  ALKPHOS 49 42  BILITOT 0.3 0.5  PROT 7.5 6.6  ALBUMIN 3.7 3.4*   Recent Labs  Lab 10/07/17 1646  LIPASE 28   No results for input(s): AMMONIA in the last 168 hours. Coagulation Profile: No results for input(s): INR, PROTIME in the last 168 hours. Cardiac Enzymes: No results for input(s): CKTOTAL, CKMB, CKMBINDEX, TROPONINI in the last 168 hours. BNP (last 3 results) No results for input(s): PROBNP in the last 8760 hours. HbA1C: No results for input(s): HGBA1C in the last 72 hours. CBG: No results for input(s): GLUCAP in the last 168 hours. Lipid Profile: No results for input(s): CHOL, HDL, LDLCALC, TRIG, CHOLHDL, LDLDIRECT in the last 72 hours. Thyroid Function Tests: Recent Labs    10/08/17 0602  TSH 2.781   Anemia Panel: No results for input(s): VITAMINB12, FOLATE, FERRITIN, TIBC, IRON, RETICCTPCT in the last 72 hours. Urine analysis:    Component Value Date/Time   COLORURINE YELLOW 10/07/2017 [place] 10/07/2017 1558   LABSPEC 1.010 10/07/2017 1558   PHURINE 6.5 10/07/2017 1558   GLUCOSEU NEGATIVE 10/07/2017 1558   GLUCOSEU NEGATIVE 01/13/2013 0842   HGBUR MODERATE (A) 10/07/2017 1558   BILIRUBINUR NEGATIVE 10/07/2017 1558   KETONESUR NEGATIVE 10/07/2017 1558   PROTEINUR NEGATIVE 10/07/2017 1558   UROBILINOGEN 0.2 01/13/2013 0842   NITRITE NEGATIVE 10/07/2017 1558   LEUKOCYTESUR TRACE (A) 10/07/2017 1558   No results found for this or any previous visit (from the past 240 hour(s)).    Radiology Studies: Ct Abdomen Pelvis W Contrast  Result Date: 10/07/2017 CLINICAL DATA:   Low pelvic pain with a recent diagnosis of diverticulitis. EXAM: CT ABDOMEN AND PELVIS WITH CONTRAST TECHNIQUE: Multidetector CT imaging of the abdomen and pelvis was performed using the standard protocol following bolus administration of intravenous contrast. CONTRAST:  170mL ISOVUE-300 IOPAMIDOL (ISOVUE-300) INJECTION 61% COMPARISON:  06/20/2012 FINDINGS: Lower chest:  Unremarkable. Hepatobiliary: Tiny subcapsular low-density lesion lateral segment left liver is unchanged compatible with a benign process. There is no evidence for gallstones, gallbladder wall thickening, or pericholecystic fluid. No intrahepatic or extrahepatic biliary dilation. Pancreas: No focal mass lesion. No dilatation of the main duct. No intraparenchymal cyst. No peripancreatic edema. Spleen: No splenomegaly. No focal mass lesion. Adrenals/Urinary Tract: No adrenal nodule or mass. Kidneys are unremarkable. No evidence for hydroureter. Marked thickening left bladder wall noted. Stomach/Bowel: Stomach is nondistended. No gastric wall thickening. No evidence of outlet obstruction. Duodenum is normally positioned as is the ligament of Treitz. Insert duodenal diverticulum No small bowel wall thickening. No small bowel dilatation. The terminal ileum is normal. The appendix  is normal. Sigmoid colonic wall remains thickened and ill-defined. In the interval since the prior study, tethering of the bladder wall to the sigmoid colon has developed (see image 43 of coronal series 5). This abnormal thickening has associated fluid collection measuring 14 x 19 mm in diameter and extending 4.2 cm and oblique craniocaudal length. This abnormal fluid collection tracks posteriorly and inferiorly at the midline, involving the posterior bladder wall and extending down to the level of vaginal cuff. Vascular/Lymphatic: There is abdominal aortic atherosclerosis without aneurysm. There is no gastrohepatic or hepatoduodenal ligament lymphadenopathy. No intraperitoneal  or retroperitoneal lymphadenopathy. No pelvic sidewall lymphadenopathy. Reproductive: Uterus is surgically absent. The vaginal cuff is tethered to the posterior wall of the bladder where the above described bladder wall thickening and fluid collection are evident. Other: No intraperitoneal free fluid. Musculoskeletal: Bone windows reveal no worrisome lytic or sclerotic osseous lesions. IMPRESSION: 1. Interval progression of inflammatory changes in the pelvis with sigmoid colon showing ill-defined wall thickening and now being tethered to the bladder which has developed fairly marked asymmetric bladder wall thickening. A rim enhancing fluid collection, compatible with abscess, is either in the posterior bladder wall or tracks immediately along the posterior bladder wall towards the vaginal cuff. Although colovesical fistula cannot be excluded, there is no evidence for gas in the urinary bladder. Imaging features also raise the question of colovaginal fistula although the apparent abscess terminates at the level of vaginal cuff with no air or fluid seen in the vaginal lumen. 2.  Aortic Atherosclerois (ICD10-170.0) Electronically Signed   By: Misty Stanley M.D.   On: 10/07/2017 18:27    Scheduled Meds: . methylPREDNISolone (SOLU-MEDROL) injection  4 mg Intravenous Daily  . sodium chloride flush  3 mL Intravenous Q12H   Continuous Infusions: . sodium chloride 100 mL/hr at 10/08/17 0207  . piperacillin-tazobactam (ZOSYN)  IV 3.375 g (10/08/17 0300)     LOS: 1 day   Time spent: 25 minutes.  Vance Gather, MD [facility] Pager [phone]  If 7PM-7AM, please contact night-coverage [email or web address] Password [facility] 10/08/2017, 8:42 AM

## 2017-10-08 NOTE — Consult Note (Signed)
[facility] Consult/Admission Note  Morgan [name] [birth year]  [id number].    Requesting MD: Dr. Bonner Puna Chief Complaint/Reason for Consult: Diverticulitis   HPI:   Pt is a 66 year old female with a history of RA, GERD, HLD, history of adenomatous colon polyps, abdominal hysterectomy, recent admission (10/14-10/17) for diverticulitis who presented to the ED for worsening lower abdominal pain. She was discharged on cefdinir and flagyl, followed up with [facility] 10/29 with colonoscopy planned Dec 19. Had worsening symptoms, she called the GI office 11/5 and was directed to the ED where CT demonstrated sigmoid wall thickening with tethering to the bladder wall and enhancing fluid collection terminating at the level of the vaginal cuff. Size 14x38m and extending 4.2cm oblique craniocaudal length. No air seen in bladder or vaginal lumen. WBC 16.7, UA without evidence of infection, no RBCs or WBCs on micro. IV antibiotics were given and she was admitted. We were asked to see for diverticulitis. Pt states pain returned and progressively worsened 4-5 days ago to constant, severe pain in her LLQ and suprapubic region with associated dysuria, pressure before and after micturition, chills, multiple loose BM's per day without blood and nausea. No vomiting or fevers. Pt states temp at home around 99 degress F.    ROS:  Review of Systems  Constitutional: Positive for chills. Negative for diaphoresis and fever.  HENT: Negative for sore throat.   Respiratory: Negative for cough and shortness of breath.   Cardiovascular: Negative for chest pain.  Gastrointestinal: Positive for abdominal pain and nausea. Negative for blood in stool, constipation, diarrhea and vomiting.  Genitourinary: Positive for dysuria. Negative for hematuria.  Musculoskeletal: Negative for back pain.  Skin: Negative for rash.  Neurological: Negative for dizziness and loss of consciousness.  All other systems reviewed and are  negative.    Family History  Problem Relation Age of Onset  . Hyperlipidemia Mother   . Hypertension Mother   . Heart disease Sister        sister #1  . Breast cancer Other        half-sister  . Liver disease Sister        sister #1 - hx of liver transplant 14+ yrs ago  . Healthy Sister        sister #2  . Diabetes Maternal Uncle   . Heart failure Brother   . Diabetes Maternal Aunt        x 2  . Heart failure Son   . Colon cancer Neg Hx     Past Medical History:  Diagnosis Date  . Abnormal chest x-ray   . Abnormal electrocardiogram   . Allergic rhinitis   . Colon polyp    polypoid colorectal mucosa/adenomatous  . Contact dermatitis due to poison ivy   . Cystitis   . Diverticulosis of colon   . Fatty liver disease, nonalcoholic   . Gallbladder polyp   . GERD (gastroesophageal reflux disease)   . Hearing loss   . Hemorrhoids   . Hyperlipidemia   . Lymphocytosis   . Overweight(278.02)   . RA (rheumatoid arthritis) ([place]   . Venous insufficiency     Past Surgical History:  Procedure Laterality Date  . chemical and laser endovenous ablation of LE veins  2008, 2009, 2010, 2011   Dr KEilleen Kempfet al  . FOOT SURGERY     left  . INNER EAR SURGERY     childhood; right  . NASAL SEPTUM SURGERY    .  TOTAL ABDOMINAL HYSTERECTOMY  12/86    Social History:  reports that she quit smoking about 15 years ago. Her smoking use included cigarettes. She has a 7.50 pack-year smoking history. she has never used smokeless tobacco. She reports that she drinks alcohol. She reports that she does not use drugs.  Allergies:  Allergies  Allergen Reactions  . Remicade [Infliximab] Swelling and Other (See Comments)    Facial swelling, throat was closing up.     Medications Prior to Admission  Medication Sig Dispense Refill  . acetaminophen (TYLENOL) 500 MG tablet Take 500 mg by mouth every 6 (six) hours as needed.    Marland Kitchen aspirin 81 MG tablet Take 81 mg by mouth daily. Takes 3 times  weekly due to bruising    . calcium carbonate (OS-CAL) 600 MG TABS Take 2 tablets by mouth daily.      . fenofibrate 160 MG tablet Take 1 tablet (160 mg total) by mouth daily. 90 tablet 3  . fexofenadine (ALLEGRA) 180 MG tablet Take 180 mg by mouth daily.    Marland Kitchen golimumab (SIMPONI ARIA) 50 MG/4ML SOLN injection Inject 50 mg into the vein every 8 (eight) weeks.     . hydrochlorothiazide (HYDRODIURIL) 25 MG tablet Take 25 mg by mouth daily.    . hydroxychloroquine (PLAQUENIL) 200 MG tablet Take 400 mg by mouth daily.    . hyoscyamine (LEVSIN SL) 0.125 MG SL tablet Take 1 tablet (0.125 mg total) by mouth every 6 (six) hours as needed. 100 tablet 3  . Multiple Vitamin (MULTIVITAMIN) tablet Take 1 tablet by mouth daily.      . polyethylene glycol (MIRALAX / GLYCOLAX) packet Take 1 packet daily as needed by mouth.  0  . predniSONE (DELTASONE) 5 MG tablet Take 5 mg by mouth daily.  3  . Probiotic Product (PROBIOTIC PO) Take by mouth daily.    . simvastatin (ZOCOR) 40 MG tablet Take 1 tablet (40 mg total) by mouth at bedtime. 90 tablet 3    Blood pressure 135/61, pulse 69, temperature 98.5 F (36.9 C), temperature source Oral, resp. rate 16, height 5' 3.75" (1.619 m), weight 190 lb (86.2 kg), SpO2 98 %.  Physical Exam  Constitutional: She is oriented to person, place, and time and well-developed, well-nourished, and in no distress. Vital signs are normal. No distress.  HENT:  Head: Normocephalic and atraumatic.  Nose: Nose normal.  Mouth/Throat: Oropharynx is clear and moist. No oropharyngeal exudate.  Eyes: Conjunctivae are normal. Pupils are equal, round, and reactive to light. Right eye exhibits no discharge. Left eye exhibits no discharge. No scleral icterus.  Neck: Normal range of motion. Neck supple. No tracheal deviation present. No thyromegaly present.  Cardiovascular: Normal rate, regular rhythm, normal heart sounds and intact distal pulses. Exam reveals no gallop and no friction rub.  No  murmur heard. Pulses:      Radial pulses are 2+ on the right side, and 2+ on the left side.  Pulmonary/Chest: Effort normal and breath sounds normal. No respiratory distress. She has no decreased breath sounds. She has no wheezes. She has no rhonchi. She has no rales.  Abdominal: Soft. Normal appearance and bowel sounds are normal. She exhibits no distension and no mass. There is no hepatosplenomegaly. There is tenderness in the suprapubic area and left lower quadrant. There is no rigidity, no rebound and no guarding. No hernia.  Musculoskeletal: Normal range of motion. She exhibits no edema or deformity.  Lymphadenopathy:    She has no  cervical adenopathy.  Neurological: She is alert and oriented to person, place, and time.  Skin: Skin is warm and dry. No rash noted. She is not diaphoretic.  Psychiatric: Mood and affect normal.  Nursing note and vitals reviewed.   Results for orders placed or performed during the hospital encounter of 10/07/17 (from the past 48 hour(s))  Urinalysis, Routine w reflex microscopic     Status: Abnormal   Collection Time: 10/07/17  3:58 PM  Result Value Ref Range   Color, Urine YELLOW YELLOW   APPearance CLEAR CLEAR   Specific Gravity, Urine 1.010 1.005 - 1.030   pH 6.5 5.0 - 8.0   Glucose, UA NEGATIVE NEGATIVE mg/dL   Hgb urine dipstick MODERATE (A) NEGATIVE   Bilirubin Urine NEGATIVE NEGATIVE   Ketones, ur NEGATIVE NEGATIVE mg/dL   Protein, ur NEGATIVE NEGATIVE mg/dL   Nitrite NEGATIVE NEGATIVE   Leukocytes, UA TRACE (A) NEGATIVE  Urinalysis, Microscopic (reflex)     Status: Abnormal   Collection Time: 10/07/17  3:58 PM  Result Value Ref Range   RBC / HPF 0-5 0 - 5 RBC/hpf   WBC, UA 0-5 0 - 5 WBC/hpf   Bacteria, UA FEW (A) NONE SEEN   Squamous Epithelial / LPF 0-5 (A) NONE SEEN  Comprehensive metabolic panel     Status: Abnormal   Collection Time: 10/07/17  4:46 PM  Result Value Ref Range   Sodium 136 135 - 145 mmol/L   Potassium 3.5 3.5 - 5.1  mmol/L   Chloride 100 (L) 101 - 111 mmol/L   CO2 29 22 - 32 mmol/L   Glucose, Bld 99 65 - 99 mg/dL   BUN 15 6 - 20 mg/dL   Creatinine, Ser 0.87 0.44 - 1.00 mg/dL   Calcium 9.7 8.9 - 10.3 mg/dL   Total Protein 7.5 6.5 - 8.1 g/dL   Albumin 3.7 3.5 - 5.0 g/dL   AST 23 15 - 41 U/L   ALT 17 14 - 54 U/L   Alkaline Phosphatase 49 38 - 126 U/L   Total Bilirubin 0.3 0.3 - 1.2 mg/dL   GFR calc non Af Amer >60 >60 mL/min   GFR calc Af Amer >60 >60 mL/min    Comment: (NOTE) The eGFR has been calculated using the CKD EPI equation. This calculation has not been validated in all clinical situations. eGFR's persistently <60 mL/min signify possible Chronic Kidney Disease.    Anion gap 7 5 - 15  Lipase, blood     Status: None   Collection Time: 10/07/17  4:46 PM  Result Value Ref Range   Lipase 28 11 - 51 U/L  CBC with Differential     Status: Abnormal   Collection Time: 10/07/17  4:46 PM  Result Value Ref Range   WBC 16.7 (H) 4.0 - 10.5 K/uL   RBC 4.19 3.87 - 5.11 MIL/uL   Hemoglobin 12.3 12.0 - 15.0 g/dL   HCT 38.3 36.0 - 46.0 %   MCV 91.4 78.0 - 100.0 fL   MCH 29.4 26.0 - 34.0 pg   MCHC 32.1 30.0 - 36.0 g/dL   RDW 15.4 11.5 - 15.5 %   Platelets 114 (L) 150 - 400 K/uL    Comment: SPECIMEN CHECKED FOR CLOTS PLATELET COUNT CONFIRMED BY SMEAR PLATELETS APPEAR ADEQUATE    Neutrophils Relative % 63 %   Lymphocytes Relative 7 %   Monocytes Relative 23 %   Eosinophils Relative 2 %   Basophils Relative 0 %   Band  Neutrophils 2 %   Metamyelocytes Relative 1 %   Myelocytes 2 %   Neutro Abs 11.4 (H) 1.7 - 7.7 K/uL   Lymphs Abs 1.2 0.7 - 4.0 K/uL   Monocytes Absolute 3.8 (H) 0.1 - 1.0 K/uL   Eosinophils Absolute 0.3 0.0 - 0.7 K/uL   Basophils Absolute 0.0 0.0 - 0.1 K/uL   WBC Morphology ATYPICAL LYMPHOCYTES   Magnesium     Status: None   Collection Time: 10/08/17  6:02 AM  Result Value Ref Range   Magnesium 1.8 1.7 - 2.4 mg/dL  Phosphorus     Status: None   Collection Time: 10/08/17   6:02 AM  Result Value Ref Range   Phosphorus 3.4 2.5 - 4.6 mg/dL  TSH     Status: None   Collection Time: 10/08/17  6:02 AM  Result Value Ref Range   TSH 2.781 0.350 - 4.500 uIU/mL    Comment: Performed by a 3rd Generation assay with a functional sensitivity of <=0.01 uIU/mL.  Comprehensive metabolic panel     Status: Abnormal   Collection Time: 10/08/17  6:02 AM  Result Value Ref Range   Sodium 141 135 - 145 mmol/L   Potassium 3.7 3.5 - 5.1 mmol/L   Chloride 105 101 - 111 mmol/L   CO2 26 22 - 32 mmol/L   Glucose, Bld 88 65 - 99 mg/dL   BUN 10 6 - 20 mg/dL   Creatinine, Ser 0.72 0.44 - 1.00 mg/dL   Calcium 8.9 8.9 - 10.3 mg/dL   Total Protein 6.6 6.5 - 8.1 g/dL   Albumin 3.4 (L) 3.5 - 5.0 g/dL   AST 20 15 - 41 U/L   ALT 16 14 - 54 U/L   Alkaline Phosphatase 42 38 - 126 U/L   Total Bilirubin 0.5 0.3 - 1.2 mg/dL   GFR calc non Af Amer >60 >60 mL/min   GFR calc Af Amer >60 >60 mL/min    Comment: (NOTE) The eGFR has been calculated using the CKD EPI equation. This calculation has not been validated in all clinical situations. eGFR's persistently <60 mL/min signify possible Chronic Kidney Disease.    Anion gap 10 5 - 15  CBC     Status: Abnormal   Collection Time: 10/08/17  6:02 AM  Result Value Ref Range   WBC 13.1 (H) 4.0 - 10.5 K/uL   RBC 3.80 (L) 3.87 - 5.11 MIL/uL   Hemoglobin 11.3 (L) 12.0 - 15.0 g/dL   HCT 34.9 (L) 36.0 - 46.0 %   MCV 91.8 78.0 - 100.0 fL   MCH 29.7 26.0 - 34.0 pg   MCHC 32.4 30.0 - 36.0 g/dL   RDW 15.6 (H) 11.5 - 15.5 %   Platelets 99 (L) 150 - 400 K/uL    Comment: REPEATED TO VERIFY SPECIMEN CHECKED FOR CLOTS PLATELET COUNT CONFIRMED BY SMEAR    Ct Abdomen Pelvis W Contrast  Result Date: 10/07/2017 CLINICAL DATA:  Low pelvic pain with a recent diagnosis of diverticulitis. EXAM: CT ABDOMEN AND PELVIS WITH CONTRAST TECHNIQUE: Multidetector CT imaging of the abdomen and pelvis was performed using the standard protocol following bolus  administration of intravenous contrast. CONTRAST:  175m ISOVUE-300 IOPAMIDOL (ISOVUE-300) INJECTION 61% COMPARISON:  06/20/2012 FINDINGS: Lower chest:  Unremarkable. Hepatobiliary: Tiny subcapsular low-density lesion lateral segment left liver is unchanged compatible with a benign process. There is no evidence for gallstones, gallbladder wall thickening, or pericholecystic fluid. No intrahepatic or extrahepatic biliary dilation. Pancreas: No focal mass lesion. No  dilatation of the main duct. No intraparenchymal cyst. No peripancreatic edema. Spleen: No splenomegaly. No focal mass lesion. Adrenals/Urinary Tract: No adrenal nodule or mass. Kidneys are unremarkable. No evidence for hydroureter. Marked thickening left bladder wall noted. Stomach/Bowel: Stomach is nondistended. No gastric wall thickening. No evidence of outlet obstruction. Duodenum is normally positioned as is the ligament of Treitz. Insert duodenal diverticulum No small bowel wall thickening. No small bowel dilatation. The terminal ileum is normal. The appendix is normal. Sigmoid colonic wall remains thickened and ill-defined. In the interval since the prior study, tethering of the bladder wall to the sigmoid colon has developed (see image 43 of coronal series 5). This abnormal thickening has associated fluid collection measuring 14 x 19 mm in diameter and extending 4.2 cm and oblique craniocaudal length. This abnormal fluid collection tracks posteriorly and inferiorly at the midline, involving the posterior bladder wall and extending down to the level of vaginal cuff. Vascular/Lymphatic: There is abdominal aortic atherosclerosis without aneurysm. There is no gastrohepatic or hepatoduodenal ligament lymphadenopathy. No intraperitoneal or retroperitoneal lymphadenopathy. No pelvic sidewall lymphadenopathy. Reproductive: Uterus is surgically absent. The vaginal cuff is tethered to the posterior wall of the bladder where the above described bladder wall  thickening and fluid collection are evident. Other: No intraperitoneal free fluid. Musculoskeletal: Bone windows reveal no worrisome lytic or sclerotic osseous lesions. IMPRESSION: 1. Interval progression of inflammatory changes in the pelvis with sigmoid colon showing ill-defined wall thickening and now being tethered to the bladder which has developed fairly marked asymmetric bladder wall thickening. A rim enhancing fluid collection, compatible with abscess, is either in the posterior bladder wall or tracks immediately along the posterior bladder wall towards the vaginal cuff. Although colovesical fistula cannot be excluded, there is no evidence for gas in the urinary bladder. Imaging features also raise the question of colovaginal fistula although the apparent abscess terminates at the level of vaginal cuff with no air or fluid seen in the vaginal lumen. 2.  Aortic Atherosclerois (ICD10-170.0) Electronically Signed   By: Misty Stanley M.D.   On: 10/07/2017 18:27      Assessment/Plan Active Problems:   Hyperlipemia   Essential hypertension   Fatty liver   Pelvic abscess in female   Immunosuppression due to drug therapy to Rheumatoid Arthritis to include prednisone daily '5mg'$  and Simponi Aria  Diverticulitis with abscess - GI and Urology involved and appreciate their assistance - CT cystogram pending - Will ask IR if able to drain this abscess - Ideally this will resolve without need for surgical intervention and later elective surgery can be discussed - Continue IV antibiotics, IV fluids, bowel rest  We will continue to follow. Thank you for this consult.  Kalman Drape, [facility] 10/08/2017, 11:38 AM Pager: [phone] Consults: [phone] Mon-Fri 7:00 am-4:30 pm Sat-Sun 7:00 am-11:30 am

## 2017-10-08 NOTE — Consult Note (Signed)
Urology Consult   Physician requesting consult: Toy Baker, MD  Reason for consult: Possible colovesical fistula   History of Present Illness: Morgan [name] is a 66 y.o. female who is currently being evaluated for acute diverticulitis.   She was recently treated with a course of Omnicef and flagyl for the same issue in mid-October.  Yesterday, the patient presented to the [facility] with acute and worsening abdominal and suprapubic pain.  She had a CT at that time that demonstrated an inflammatory process emanating from the sigmoid colon creating a fluid collection concerning for an abscess involving the perivesical tissue.  Urology was consulted to evaluate the patient for possible colovesical fistula versus bladder abscess. From a voiding standpoint, the patient reports a 2 to three-week history of urinary urgency/frequency and suprapubic discomfort. She denies dysuria, hematuria, fecaluria or pneumaturia. She did report voiding a small amount of mucus 2 or 3 days ago. At baseline, the patient has very sporadic urinary tract infections and states that she's had maybe 1-2 in the last 5 years. She has no prior history of nephrolithiasis or baseline voiding dysfunction.  She denies a history of voiding or storage urinary symptoms, hematuria, UTIs, STDs, urolithiasis, GU malignancy/trauma/surgery.  Past Medical History:  Diagnosis Date  . Abnormal chest x-ray   . Abnormal electrocardiogram   . Allergic rhinitis   . Colon polyp    polypoid colorectal mucosa/adenomatous  . Contact dermatitis due to poison ivy   . Cystitis   . Diverticulosis of colon   . Fatty liver disease, nonalcoholic   . Gallbladder polyp   . GERD (gastroesophageal reflux disease)   . Hearing loss   . Hemorrhoids   . Hyperlipidemia   . Lymphocytosis   . Overweight(278.02)   . RA (rheumatoid arthritis) ([place])   . Venous insufficiency     Past Surgical History:  Procedure Laterality Date  .  chemical and laser endovenous ablation of LE veins  2008, 2009, 2010, 2011   Dr Eilleen Kempf et al  . FOOT SURGERY     left  . INNER EAR SURGERY     childhood; right  . NASAL SEPTUM SURGERY    . TOTAL ABDOMINAL HYSTERECTOMY  12/86    Current Hospital Medications:  Home Meds:  Current Meds  Medication Sig  . acetaminophen (TYLENOL) 500 MG tablet Take 500 mg by mouth every 6 (six) hours as needed.  Marland Kitchen aspirin 81 MG tablet Take 81 mg by mouth daily. Takes 3 times weekly due to bruising  . calcium carbonate (OS-CAL) 600 MG TABS Take 2 tablets by mouth daily.    . fenofibrate 160 MG tablet Take 1 tablet (160 mg total) by mouth daily.  . fexofenadine (ALLEGRA) 180 MG tablet Take 180 mg by mouth daily.  Marland Kitchen golimumab (SIMPONI ARIA) 50 MG/4ML SOLN injection Inject 50 mg into the vein every 8 (eight) weeks.   . hydrochlorothiazide (HYDRODIURIL) 25 MG tablet Take 25 mg by mouth daily.  . hydroxychloroquine (PLAQUENIL) 200 MG tablet Take 400 mg by mouth daily.  . hyoscyamine (LEVSIN SL) 0.125 MG SL tablet Take 1 tablet (0.125 mg total) by mouth every 6 (six) hours as needed.  . Multiple Vitamin (MULTIVITAMIN) tablet Take 1 tablet by mouth daily.    . polyethylene glycol (MIRALAX / GLYCOLAX) packet Take 1 packet daily as needed by mouth.  . predniSONE (DELTASONE) 5 MG tablet Take 5 mg by mouth daily.  . Probiotic Product (PROBIOTIC PO) Take by mouth daily.  Marland Kitchen  simvastatin (ZOCOR) 40 MG tablet Take 1 tablet (40 mg total) by mouth at bedtime.    Scheduled Meds: . methylPREDNISolone (SOLU-MEDROL) injection  4 mg Intravenous Daily  . sodium chloride flush  3 mL Intravenous Q12H   Continuous Infusions: . sodium chloride 100 mL/hr at 10/08/17 0207  . piperacillin-tazobactam (ZOSYN)  IV 3.375 g (10/08/17 0300)   PRN Meds:.acetaminophen **OR** acetaminophen, morphine injection, ondansetron **OR** ondansetron (ZOFRAN) IV  Allergies:  Allergies  Allergen Reactions  . Remicade [Infliximab] Swelling and  Other (See Comments)    Facial swelling, throat was closing up.     Family History  Problem Relation Age of Onset  . Hyperlipidemia Mother   . Hypertension Mother   . Heart disease Sister        sister #1  . Breast cancer Other        half-sister  . Liver disease Sister        sister #1 - hx of liver transplant 14+ yrs ago  . Healthy Sister        sister #2  . Diabetes Maternal Uncle   . Heart failure Brother   . Diabetes Maternal Aunt        x 2  . Heart failure Son   . Colon cancer Neg Hx     Social History:  reports that she quit smoking about 15 years ago. Her smoking use included cigarettes. She has a 7.50 pack-year smoking history. she has never used smokeless tobacco. She reports that she drinks alcohol. She reports that she does not use drugs.  ROS: A complete review of systems was performed.  All systems are negative except for pertinent findings as noted.  Physical Exam:  Vital signs in last 24 hours: Temp:  [97.9 F (36.6 C)-98.3 F (36.8 C)] 98.3 F (36.8 C) (11/06 0559) Pulse Rate:  [62-86] 62 (11/06 0559) Resp:  [16-17] 16 (11/06 0559) BP: (106-126)/(48-65) 126/53 (11/06 0559) SpO2:  [86 %-99 %] 99 % (11/06 0559) Weight:  [86.2 kg (190 lb)] 86.2 kg (190 lb) (11/05 1540) Constitutional:  Alert and oriented, No acute distress Cardiovascular: Regular rate and rhythm, No JVD Respiratory: Normal respiratory effort, Lungs clear bilaterally GI: Abdomen is soft, nontender, nondistended, no abdominal masses GU: No CVA tenderness.  Foley catheter in place and draining clear-yellow urine  Lymphatic: No lymphadenopathy Neurologic: Grossly intact, no focal deficits Psychiatric: Normal mood and affect  Laboratory Data:  Recent Labs    10/07/17 1646 10/08/17 0602  WBC 16.7* 13.1*  HGB 12.3 11.3*  HCT 38.3 34.9*  PLT 114* 99*    Recent Labs    10/07/17 1646 10/08/17 0602  NA 136 141  K 3.5 3.7  CL 100* 105  GLUCOSE 99 88  BUN 15 10  CALCIUM 9.7 8.9   CREATININE 0.87 0.72     Results for orders placed or performed during the hospital encounter of 10/07/17 (from the past 24 hour(s))  Urinalysis, Routine w reflex microscopic     Status: Abnormal   Collection Time: 10/07/17  3:58 PM  Result Value Ref Range   Color, Urine YELLOW YELLOW   APPearance CLEAR CLEAR   Specific Gravity, Urine 1.010 1.005 - 1.030   pH 6.5 5.0 - 8.0   Glucose, UA NEGATIVE NEGATIVE mg/dL   Hgb urine dipstick MODERATE (A) NEGATIVE   Bilirubin Urine NEGATIVE NEGATIVE   Ketones, ur NEGATIVE NEGATIVE mg/dL   Protein, ur NEGATIVE NEGATIVE mg/dL   Nitrite NEGATIVE NEGATIVE   Leukocytes, UA  TRACE (A) NEGATIVE  Urinalysis, Microscopic (reflex)     Status: Abnormal   Collection Time: 10/07/17  3:58 PM  Result Value Ref Range   RBC / HPF 0-5 0 - 5 RBC/hpf   WBC, UA 0-5 0 - 5 WBC/hpf   Bacteria, UA FEW (A) NONE SEEN   Squamous Epithelial / LPF 0-5 (A) NONE SEEN  Comprehensive metabolic panel     Status: Abnormal   Collection Time: 10/07/17  4:46 PM  Result Value Ref Range   Sodium 136 135 - 145 mmol/L   Potassium 3.5 3.5 - 5.1 mmol/L   Chloride 100 (L) 101 - 111 mmol/L   CO2 29 22 - 32 mmol/L   Glucose, Bld 99 65 - 99 mg/dL   BUN 15 6 - 20 mg/dL   Creatinine, Ser 0.87 0.44 - 1.00 mg/dL   Calcium 9.7 8.9 - 10.3 mg/dL   Total Protein 7.5 6.5 - 8.1 g/dL   Albumin 3.7 3.5 - 5.0 g/dL   AST 23 15 - 41 U/L   ALT 17 14 - 54 U/L   Alkaline Phosphatase 49 38 - 126 U/L   Total Bilirubin 0.3 0.3 - 1.2 mg/dL   GFR calc non Af Amer >60 >60 mL/min   GFR calc Af Amer >60 >60 mL/min   Anion gap 7 5 - 15  Lipase, blood     Status: None   Collection Time: 10/07/17  4:46 PM  Result Value Ref Range   Lipase 28 11 - 51 U/L  CBC with Differential     Status: Abnormal   Collection Time: 10/07/17  4:46 PM  Result Value Ref Range   WBC 16.7 (H) 4.0 - 10.5 K/uL   RBC 4.19 3.87 - 5.11 MIL/uL   Hemoglobin 12.3 12.0 - 15.0 g/dL   HCT 38.3 36.0 - 46.0 %   MCV 91.4 78.0 - 100.0  fL   MCH 29.4 26.0 - 34.0 pg   MCHC 32.1 30.0 - 36.0 g/dL   RDW 15.4 11.5 - 15.5 %   Platelets 114 (L) 150 - 400 K/uL   Neutrophils Relative % 63 %   Lymphocytes Relative 7 %   Monocytes Relative 23 %   Eosinophils Relative 2 %   Basophils Relative 0 %   Band Neutrophils 2 %   Metamyelocytes Relative 1 %   Myelocytes 2 %   Neutro Abs 11.4 (H) 1.7 - 7.7 K/uL   Lymphs Abs 1.2 0.7 - 4.0 K/uL   Monocytes Absolute 3.8 (H) 0.1 - 1.0 K/uL   Eosinophils Absolute 0.3 0.0 - 0.7 K/uL   Basophils Absolute 0.0 0.0 - 0.1 K/uL   WBC Morphology ATYPICAL LYMPHOCYTES   Magnesium     Status: None   Collection Time: 10/08/17  6:02 AM  Result Value Ref Range   Magnesium 1.8 1.7 - 2.4 mg/dL  Phosphorus     Status: None   Collection Time: 10/08/17  6:02 AM  Result Value Ref Range   Phosphorus 3.4 2.5 - 4.6 mg/dL  TSH     Status: None   Collection Time: 10/08/17  6:02 AM  Result Value Ref Range   TSH 2.781 0.350 - 4.500 uIU/mL  Comprehensive metabolic panel     Status: Abnormal   Collection Time: 10/08/17  6:02 AM  Result Value Ref Range   Sodium 141 135 - 145 mmol/L   Potassium 3.7 3.5 - 5.1 mmol/L   Chloride 105 101 - 111 mmol/L   CO2 26 22 -  32 mmol/L   Glucose, Bld 88 65 - 99 mg/dL   BUN 10 6 - 20 mg/dL   Creatinine, Ser 0.72 0.44 - 1.00 mg/dL   Calcium 8.9 8.9 - 10.3 mg/dL   Total Protein 6.6 6.5 - 8.1 g/dL   Albumin 3.4 (L) 3.5 - 5.0 g/dL   AST 20 15 - 41 U/L   ALT 16 14 - 54 U/L   Alkaline Phosphatase 42 38 - 126 U/L   Total Bilirubin 0.5 0.3 - 1.2 mg/dL   GFR calc non Af Amer >60 >60 mL/min   GFR calc Af Amer >60 >60 mL/min   Anion gap 10 5 - 15  CBC     Status: Abnormal   Collection Time: 10/08/17  6:02 AM  Result Value Ref Range   WBC 13.1 (H) 4.0 - 10.5 K/uL   RBC 3.80 (L) 3.87 - 5.11 MIL/uL   Hemoglobin 11.3 (L) 12.0 - 15.0 g/dL   HCT 34.9 (L) 36.0 - 46.0 %   MCV 91.8 78.0 - 100.0 fL   MCH 29.7 26.0 - 34.0 pg   MCHC 32.4 30.0 - 36.0 g/dL   RDW 15.6 (H) 11.5 - 15.5 %    Platelets 99 (L) 150 - 400 K/uL   No results found for this or any previous visit (from the past 240 hour(s)).  Renal Function: Recent Labs    10/07/17 1646 10/08/17 0602  CREATININE 0.87 0.72   Estimated Creatinine Clearance: 71.1 mL/min (by C-G formula based on SCr of 0.72 mg/dL).  Radiologic Imaging: Ct Abdomen Pelvis W Contrast  Result Date: 10/07/2017 CLINICAL DATA:  Low pelvic pain with a recent diagnosis of diverticulitis. EXAM: CT ABDOMEN AND PELVIS WITH CONTRAST TECHNIQUE: Multidetector CT imaging of the abdomen and pelvis was performed using the standard protocol following bolus administration of intravenous contrast. CONTRAST:  16mL ISOVUE-300 IOPAMIDOL (ISOVUE-300) INJECTION 61% COMPARISON:  06/20/2012 FINDINGS: Lower chest:  Unremarkable. Hepatobiliary: Tiny subcapsular low-density lesion lateral segment left liver is unchanged compatible with a benign process. There is no evidence for gallstones, gallbladder wall thickening, or pericholecystic fluid. No intrahepatic or extrahepatic biliary dilation. Pancreas: No focal mass lesion. No dilatation of the main duct. No intraparenchymal cyst. No peripancreatic edema. Spleen: No splenomegaly. No focal mass lesion. Adrenals/Urinary Tract: No adrenal nodule or mass. Kidneys are unremarkable. No evidence for hydroureter. Marked thickening left bladder wall noted. Stomach/Bowel: Stomach is nondistended. No gastric wall thickening. No evidence of outlet obstruction. Duodenum is normally positioned as is the ligament of Treitz. Insert duodenal diverticulum No small bowel wall thickening. No small bowel dilatation. The terminal ileum is normal. The appendix is normal. Sigmoid colonic wall remains thickened and ill-defined. In the interval since the prior study, tethering of the bladder wall to the sigmoid colon has developed (see image 43 of coronal series 5). This abnormal thickening has associated fluid collection measuring 14 x 19 mm in  diameter and extending 4.2 cm and oblique craniocaudal length. This abnormal fluid collection tracks posteriorly and inferiorly at the midline, involving the posterior bladder wall and extending down to the level of vaginal cuff. Vascular/Lymphatic: There is abdominal aortic atherosclerosis without aneurysm. There is no gastrohepatic or hepatoduodenal ligament lymphadenopathy. No intraperitoneal or retroperitoneal lymphadenopathy. No pelvic sidewall lymphadenopathy. Reproductive: Uterus is surgically absent. The vaginal cuff is tethered to the posterior wall of the bladder where the above described bladder wall thickening and fluid collection are evident. Other: No intraperitoneal free fluid. Musculoskeletal: Bone windows reveal no worrisome lytic or  sclerotic osseous lesions. IMPRESSION: 1. Interval progression of inflammatory changes in the pelvis with sigmoid colon showing ill-defined wall thickening and now being tethered to the bladder which has developed fairly marked asymmetric bladder wall thickening. A rim enhancing fluid collection, compatible with abscess, is either in the posterior bladder wall or tracks immediately along the posterior bladder wall towards the vaginal cuff. Although colovesical fistula cannot be excluded, there is no evidence for gas in the urinary bladder. Imaging features also raise the question of colovaginal fistula although the apparent abscess terminates at the level of vaginal cuff with no air or fluid seen in the vaginal lumen. 2.  Aortic Atherosclerois (ICD10-170.0) Electronically Signed   By: Misty Stanley M.D.   On: 10/07/2017 18:27    I independently reviewed the above imaging studies.  Impression/Recommendation Acute diverticulitis with possible colovesical fistula versus perivesical abscess  -There is an obvious inflammatory process emanating from the sigmoid colon to the perivesical tissue and posterior bladder wall. It's unclear, based on her CT from the [facility], the patients voiding symptoms and unremarkable UA, whether or not she has a colovesical fistula. Irregardless, endoscopic drainage of the perivesical fluid/abscess presents very high risk for bowel injury and/or creation of a colovesical fistula. I recommend having general surgery and/or GI evaluate the patient. Will obtain a CT cystogram then hopes of further delineating whether or not she has a colovesical fistula. No acute surgical intervention from a urologic standpoint at this time. Okay to remove Foley catheter following CT cystogram.  Ellison Hughs, MD [facility] 10/08/2017, 8:53 AM

## 2017-10-08 NOTE — Consult Note (Signed)
Consultation  Referring Provider:  [facility]/ Dr Bonner Puna Primary Care Physician:  [name], MD Primary Gastroenterologist:  Dr.Perry  Reason for Consultation:  Complicated Diverticulitis with abscess  HPI: Morgan [name] is a 66 y.o. female known to Dr. Scarlette Shorts, with history of adenomatous colon polyps and prior history of diverticulitis. Patient also has history of rheumatoid arthritis for which she is on biologic therapy with Simponi (last dose 9 weeks ago). She has history of GERD and fatty liver / nonalcoholic. Patient was admitted last evening after she presented to the emergency room with severe doubling over abdominal pain. Patient had a recent admission to [facility] 10/14 through 09/18/2017 with acute diverticulitis. She was discharged home on a 10 day course of Omnicef and Flagyl which she completed on 09/25/2017. CT on admission had shown acute sigmoid diverticulitis with inflamed colon adherent to the bladder with thickened bladder wall, there is no abscess and no gas in the bladder but felt to be at risk for developing colovesicular fistula.  She was seen in our office by Ellouise Newer PA-C on 09/30/2017. Overall ,by notes she was feeling better than on admission but was continuing to have some lower abdominal pressure-type discomfort worse with bowel movements and improved after. She was scheduled for follow-up colonoscopy on 11/20/2017 because of history of adenomatous colon polyps. She was advised to call should she have any worsening in her mild lower abdominal discomfort. Patient says that she developed increased lower abdominal discomfort 2 days later, started taking Levsin with some benefit. She had increased pain this past weekend some associated nausea, continued sensation of urgency for urination and pressure in the bladder area. No hematuria and pneumaturia or fever/chills. She says her pain was "awful" yesterday morning when she woke up, then  presented to the emergency room.  Last colonoscopy was done in September 2013 for history of adenomatous colon polyps. She was noted to have mild sigmoid diverticulosis, no polyps found but was advised to have 5 year interval follow-up. CT scan last evening showed that the sigmoid colon remains thickened and ill-defined and there is tethering of the sigmoid colon to the bladder. There is no adjacent 14 x 19 x 4.2 cm fluid collection tracking posteriorly down towards the vaginal cuff. There is marked asymmetry of the bladder wall but no air noted in the bladder. She's been started on IV Zosyn. Patient has been evaluated by urology because of concern for possible colovesicular fistula and she is scheduled for a CT cystogram. Drainage of the abscess is felt to be at increased risk as will likely cause a fistula .   Past Medical History:  Diagnosis Date  . Abnormal chest x-ray   . Abnormal electrocardiogram   . Allergic rhinitis   . Colon polyp    polypoid colorectal mucosa/adenomatous  . Contact dermatitis due to poison ivy   . Cystitis   . Diverticulosis of colon   . Fatty liver disease, nonalcoholic   . Gallbladder polyp   . GERD (gastroesophageal reflux disease)   . Hearing loss   . Hemorrhoids   . Hyperlipidemia   . Lymphocytosis   . Overweight(278.02)   . RA (rheumatoid arthritis) ([place])   . Venous insufficiency     Past Surgical History:  Procedure Laterality Date  . chemical and laser endovenous ablation of LE veins  2008, 2009, 2010, 2011   Dr Eilleen Kempf et al  . FOOT SURGERY     left  . INNER EAR  SURGERY     childhood; right  . NASAL SEPTUM SURGERY    . TOTAL ABDOMINAL HYSTERECTOMY  12/86    Prior to Admission medications   Medication Sig Start Date End Date Taking? Authorizing Provider  acetaminophen (TYLENOL) 500 MG tablet Take 500 mg by mouth every 6 (six) hours as needed.   Yes [provider]  aspirin 81 MG tablet Take 81 mg by mouth daily. Takes 3 times  weekly due to bruising   Yes [provider]  calcium carbonate (OS-CAL) 600 MG TABS Take 2 tablets by mouth daily.     Yes [provider]  fenofibrate 160 MG tablet Take 1 tablet (160 mg total) by mouth daily. 11/23/13  Yes Noralee Space, MD  fexofenadine (ALLEGRA) 180 MG tablet Take 180 mg by mouth daily.   Yes [provider]  golimumab (SIMPONI ARIA) 50 MG/4ML SOLN injection Inject 50 mg into the vein every 8 (eight) weeks.    Yes [provider]  hydrochlorothiazide (HYDRODIURIL) 25 MG tablet Take 25 mg by mouth daily.   Yes [provider]  hydroxychloroquine (PLAQUENIL) 200 MG tablet Take 400 mg by mouth daily.   Yes [provider]  hyoscyamine (LEVSIN SL) 0.125 MG SL tablet Take 1 tablet (0.125 mg total) by mouth every 6 (six) hours as needed. 09/30/17  Yes Levin Erp, PA  Multiple Vitamin (MULTIVITAMIN) tablet Take 1 tablet by mouth daily.     Yes [provider]  polyethylene glycol (MIRALAX / GLYCOLAX) packet Take 1 packet daily as needed by mouth. 09/19/17  Yes [provider]  predniSONE (DELTASONE) 5 MG tablet Take 5 mg by mouth daily. 08/24/14  Yes [provider]  Probiotic Product (PROBIOTIC PO) Take by mouth daily.   Yes [provider]  simvastatin (ZOCOR) 40 MG tablet Take 1 tablet (40 mg total) by mouth at bedtime. 11/23/13  Yes Noralee Space, MD    Current Facility-Administered Medications  Medication Dose Route Frequency Provider Last Rate Last Dose  . acetaminophen (TYLENOL) tablet 650 mg  650 mg Oral Q6H PRN Toy Baker, MD       Or  . acetaminophen (TYLENOL) suppository 650 mg  650 mg Rectal Q6H PRN Doutova, Anastassia, MD      . methylPREDNISolone sodium succinate (SOLU-MEDROL) 40 mg/mL injection 4 mg  4 mg Intravenous Daily Doutova, Anastassia, MD   4 mg at 10/08/17 1122  . morphine 2 MG/ML injection 2 mg  2 mg Intravenous Q3H PRN Toy Baker, MD    2 mg at 10/08/17 1123  . ondansetron (ZOFRAN) tablet 4 mg  4 mg Oral Q6H PRN Doutova, Anastassia, MD       Or  . ondansetron (ZOFRAN) injection 4 mg  4 mg Intravenous Q6H PRN Doutova, Anastassia, MD   4 mg at 10/08/17 1123  . piperacillin-tazobactam (ZOSYN) IVPB 3.375 g  3.375 g Intravenous Q8H Romona Curls, [place]   Stopped at 10/08/17 0700  . sodium chloride flush (NS) 0.9 % injection 3 mL  3 mL Intravenous Q12H Doutova, Anastassia, MD   3 mL at 10/08/17 1000    Allergies as of 10/07/2017 - Review Complete 10/07/2017  Allergen Reaction Noted  . Remicade [infliximab] Swelling and Other (See Comments) 05/12/2015    Family History  Problem Relation Age of Onset  . Hyperlipidemia Mother   . Hypertension Mother   . Heart disease Sister        sister #1  . Breast  cancer Other        half-sister  . Liver disease Sister        sister #1 - hx of liver transplant 14+ yrs ago  . Healthy Sister        sister #2  . Diabetes Maternal Uncle   . Heart failure Brother   . Diabetes Maternal Aunt        x 2  . Heart failure Son   . Colon cancer Neg Hx     Social History   Socioeconomic History  . Marital status: Married    Spouse name: Morgan [name]  . Number of children: 4  . Years of education: Not on file  . Highest education level: Not on file  Social Needs  . Financial resource strain: Not on file  . Food insecurity - worry: Not on file  . Food insecurity - inability: Not on file  . Transportation needs - medical: Not on file  . Transportation needs - non-medical: Not on file  Occupational History  . Occupation: Community education officer: [employer]  Tobacco Use  . Smoking status: Former Smoker    Packs/day: 0.50    Years: 15.00    Pack years: 7.50    Types: Cigarettes    Last attempt to quit: 12/03/2001    Years since quitting: 15.8  . Smokeless tobacco: Never Used  Substance and Sexual Activity  . Alcohol use: Yes    Comment: occasional  . Drug use: No  . Sexual  activity: Not on file  Other Topics Concern  . Not on file  Social History Narrative  . Not on file    Review of Systems: Pertinent positive and negative review of systems were noted in the above HPI section.  All other review of systems was otherwise negative.Marland Kitchen  Physical Exam: Vital signs in last 24 hours: Temp:  [97.9 F (36.6 C)-98.5 F (36.9 C)] 98.5 F (36.9 C) (11/06 0900) Pulse Rate:  [62-86] 69 (11/06 0900) Resp:  [16-17] 16 (11/06 0900) BP: (106-135)/(48-65) 135/61 (11/06 0900) SpO2:  [86 %-99 %] 98 % (11/06 0900) Weight:  [190 lb (86.2 kg)] 190 lb (86.2 kg) (11/05 1540) Last BM Date: 10/07/17 General:   Alert,  Well-developed, well-nourished, older white female pleasant and cooperative in NAD Head:  Normocephalic and atraumatic. Eyes:  Sclera clear, no icterus.   Conjunctiva pink. Ears:  Normal auditory acuity. Nose:  No deformity, discharge,  or lesions. Mouth:  No deformity or lesions.   Neck:  Supple; no masses or thyromegaly. Lungs:  Clear throughout to auscultation.   No wheezes, crackles, or rhonchi. Heart:  Regular rate and rhythm; no murmurs, clicks, rubs,  or gallops. Abdomen:  Soft tender in the left lower quadrant and suprapubic area no rebound no palpable mass, bowel sounds are present. Foley catheter in place.   Rectal:  Deferred  Msk:  Symmetrical without gross deformities. . Pulses:  Normal pulses noted. Extremities:  Without clubbing or edema. Neurologic:  Alert and  oriented x4;  grossly normal neurologically. Skin:  Intact without significant lesions or rashes.. Psych:  Alert and cooperative. Normal mood and affect.  Intake/Output from previous day: 11/05 0701 - 11/06 0700 In: 1338.3 [I.V.:338.3; IV Piggyback:1000] Out: 2400 [Urine:2400] Intake/Output this shift: Total I/O In: -  Out: 700 [Urine:700]  Lab Results: Recent Labs    10/07/17 1646 10/08/17 0602  WBC 16.7* 13.1*  HGB 12.3 11.3*  HCT 38.3 34.9*  PLT 114* 99*  BMET Recent Labs    10/07/17 1646 10/08/17 0602  NA 136 141  K 3.5 3.7  CL 100* 105  CO2 29 26  GLUCOSE 99 88  BUN 15 10  CREATININE 0.87 0.72  CALCIUM 9.7 8.9   LFT Recent Labs    10/08/17 0602  PROT 6.6  ALBUMIN 3.4*  AST 20  ALT 16  ALKPHOS 42  BILITOT 0.5   PT/INR No results for input(s): LABPROT, INR in the last 72 hours. Hepatitis Panel No results for input(s): HEPBSAG, HCVAB, HEPAIGM, HEPBIGM in the last 72 hours.    IMPRESSION:  #45 66 year old white female with recent acute sigmoid diverticulitis, initially diagnosed 09/15/2017, hospitalized 3 days elsewhere and discharged home on 10 days of oral antibiotics. CT scan had shown acute sigmoid diverticulitis with sigmoid colon adherent to the bladder which was thickened. Patient has had some mild ongoing symptoms since, acutely worsened over the past 5 days with increasing lower abdominal pain, urinary urgency and bladder pressure. Repeat CT scan last evening shows persistent sigmoid diverticulitis with a thickened sigmoid colon which is ill-defined and marked asymmetry of the bladder wall adjacent. In addition she has a 14 x 19 mm x 4.2 cm abscess. No air is seen within the bladder lumen.  #2 rheumatoid arthritis-on chronic biologic therapy with Simponi, last dose 9 weeks ago #3 history of adenomatous colon polyps last colonoscopy September 2013 no recurrent polyps but was advised to have 5 year interval follow-up  PLAN: Patient is on Zosyn, she has been evaluated by urology and CT cystogram is pending today Surgical consult in progress. Patient felt to be at increased risk for bowel injury and creation of colovesicular fistula with IR drainage of the abscess. We will defer to surgery/IR. Expect she will need to be cooled down as much as possible, with IV antibiotics, reimaging prior to considering discharge from the hospital, and ultimately will need sigmoid resection.    Alexsis Kathman  10/08/2017, 11:32  AM

## 2017-10-09 LAB — URINE CULTURE

## 2017-10-09 MED ORDER — HYDROXYCHLOROQUINE SULFATE 200 MG PO TABS
400.0000 mg | ORAL_TABLET | Freq: Every day | ORAL | Status: DC
  Administered 2017-10-09 – 2017-10-14 (×6): 400 mg via ORAL
  Filled 2017-10-09 (×6): qty 2

## 2017-10-09 MED ORDER — HYDROCHLOROTHIAZIDE 25 MG PO TABS
25.0000 mg | ORAL_TABLET | Freq: Every day | ORAL | Status: DC
  Administered 2017-10-09 – 2017-10-14 (×6): 25 mg via ORAL
  Filled 2017-10-09 (×6): qty 1

## 2017-10-09 NOTE — Progress Notes (Signed)
[facility]/Trauma Progress Note      Assessment/Plan  Active Problems:   Hyperlipemia   Essential hypertension   Fatty liver   Pelvic abscess in female   Immunosuppression due to drug therapy to Rheumatoid Arthritis to include prednisone daily 5mg  and Simponi Aria  Diverticulitis with abscess - GI and Urology involved and appreciate their assistance - CT cystogram showed no obvious communication between bladder and sigmoid, gas in bladder wall likely 2/2 abscess but cannot fully exclude fistula - IR is unable to drain abscess - continue IV abx  FEN: clears VTE: SCD;s ID: Zosyn 11/05>> Foley: yes Follow up: TBD  Plan: Ideally this will resolve without need for surgical intervention and later elective surgery can be discussed. Pt may take longer to heal in the setting of immunosuppression with prednisone for RA but ideally still plan for elective surgery vs emergent. Will need colonoscopy in 6-8 weeks.    We will continue to follow.     LOS: 2 days    Subjective:  CC; abdominal pain  Pt states pain is improved from yesterday. No pain meds needed so far today. Had a soft BM without increase in pain. No nausea or vomiting. Pt is asking about taking her home Plaquenil. Her rheumatologist, Dr. Trudie Reed,  would like for her to continue this med.    Objective: Vital signs in last 24 hours: Temp:  [97.7 F (36.5 C)-98.7 F (37.1 C)] 97.7 F (36.5 C) (11/07 0844) Pulse Rate:  [70-89] 85 (11/07 0844) Resp:  [18] 18 (11/07 0844) BP: (142-160)/(64-75) 146/65 (11/07 0844) SpO2:  [97 %-100 %] 99 % (11/07 0844) Last BM Date: 10/09/17  Intake/Output from previous day: 11/06 0701 - 11/07 0700 In: 2140 [I.V.:1990; IV Piggyback:150] Out: 3075 [Urine:3075] Intake/Output this shift: No intake/output data recorded.  PE: Gen:  Alert, NAD, pleasant, cooperative Card:  RRR, no M/G/R heard Pulm:  Rate and effort normal Abd: Soft, obese, not distended, +BS, no HSM,  mild TTP in suprapubic region and LLQ, no guarding or signs of peritonitis  Skin: no rashes noted, warm and dry   Anti-infectives: Anti-infectives (From admission, onward)   Start     Dose/Rate Route Frequency Ordered Stop   10/08/17 0300  piperacillin-tazobactam (ZOSYN) IVPB 3.375 g     3.375 g 12.5 mL/hr over 240 Minutes Intravenous Every 8 hours 10/07/17 1900     10/07/17 1900  piperacillin-tazobactam (ZOSYN) IVPB 3.375 g     3.375 g 100 mL/hr over 30 Minutes Intravenous  Once 10/07/17 1853 10/07/17 1946      Lab Results:  Recent Labs    10/07/17 1646 10/08/17 0602  WBC 16.7* 13.1*  HGB 12.3 11.3*  HCT 38.3 34.9*  PLT 114* 99*   BMET Recent Labs    10/07/17 1646 10/08/17 0602  NA 136 141  K 3.5 3.7  CL 100* 105  CO2 29 26  GLUCOSE 99 88  BUN 15 10  CREATININE 0.87 0.72  CALCIUM 9.7 8.9   PT/INR No results for input(s): LABPROT, INR in the last 72 hours. CMP     Component Value Date/Time   NA 141 10/08/2017 0602   NA 139 06/28/2017 0851   K 3.7 10/08/2017 0602   K 3.8 06/28/2017 0851   CL 105 10/08/2017 0602   CO2 26 10/08/2017 0602   CO2 28 06/28/2017 0851   GLUCOSE 88 10/08/2017 0602   GLUCOSE 94 06/28/2017 0851   BUN 10 10/08/2017 0602   BUN 21.4 06/28/2017 0851  CREATININE 0.72 10/08/2017 0602   CREATININE 1.0 06/28/2017 0851   CALCIUM 8.9 10/08/2017 0602   CALCIUM 10.8 (H) 06/28/2017 0851   PROT 6.6 10/08/2017 0602   PROT 7.9 06/28/2017 0851   ALBUMIN 3.4 (L) 10/08/2017 0602   ALBUMIN 4.2 06/28/2017 0851   AST 20 10/08/2017 0602   AST 29 06/28/2017 0851   ALT 16 10/08/2017 0602   ALT 19 06/28/2017 0851   ALKPHOS 42 10/08/2017 0602   ALKPHOS 49 06/28/2017 0851   BILITOT 0.5 10/08/2017 0602   BILITOT 0.68 06/28/2017 0851   GFRNONAA >60 10/08/2017 0602   GFRNONAA 61 11/18/2013 0813   GFRAA >60 10/08/2017 0602   GFRAA 71 11/18/2013 0813   Lipase     Component Value Date/Time   LIPASE 28 10/07/2017 1646    Studies/Results: Ct  Pelvis W Contrast  Result Date: 10/08/2017 CLINICAL DATA:  Rheumatoid arthritis.  Worsening abdominal pain. EXAM: CT PELVIS WITH CONTRAST TECHNIQUE: Multidetector CT imaging of the pelvis was performed using the standard protocol following the bolus administration of intravenous contrast. CONTRAST:  55mL ISOVUE-300 IOPAMIDOL (ISOVUE-300) INJECTION 61%, 55mL ISOVUE-300 IOPAMIDOL (ISOVUE-300) INJECTION 61% contrast administered into the bladder through a Foley catheter. COMPARISON:  CT 10/07/2017 for FINDINGS: Urinary Tract: The bladder is distended with hand injected IV contrast through the Foley catheter. Foley catheter bulb noted within the the lumen of bladder. No bladder filling defect is present. This no evidence of communication between the bladder and the vagina or the sigmoid colon. There is a small gas collection along the serosal serosal surface of the bladder wall measuring 6 mm (image 59, series 5) which is LEFT adjacent to the bladder wall (image 19, series 2). This small pocket of gas is in same vicinity as the elongated fluid collections seen on comparison CT 1 day prior. Collection is less well demonstrated (image 22-18 series 2). Trace amount nondependent gas in the bladder. Contra in the sigmoid colon is favored contrast from CT 1 day prior. No Communication with the bladder. Bowel: Thickening of the sigmoid colon similar comparison exam. Circumferential bowel wall thickening to 110 mm. No macro perforation Vascular/Lymphatic: No pelvic lymphadenopathy. Reproductive:  Prostate normal Other:  None Musculoskeletal: No aggressive osseous lesion IMPRESSION: 1. No obvious communication between the bladder and the sigmoid colon. However small gas collection along the bladder wall and small a gas within the bladder itself could indicate occult fistula. 2. High-density material within the sigmoid colon is favored contrast from CT 1 day prior. The bowel wall thickening of sigmoid colon suggests  diverticulitis or segmental colitis. Similar comparison exam 3. Small gas collection the wall of bladder likely relates to prior abscess collection which appears slightly improved. 4. Small amount of nondependent gas within the bladder with differential iatrogenic from catheterization or occult fistula. Electronically Signed   By: Suzy Bouchard M.D.   On: 10/08/2017 13:51   Ct Abdomen Pelvis W Contrast  Result Date: 10/07/2017 CLINICAL DATA:  Low pelvic pain with a recent diagnosis of diverticulitis. EXAM: CT ABDOMEN AND PELVIS WITH CONTRAST TECHNIQUE: Multidetector CT imaging of the abdomen and pelvis was performed using the standard protocol following bolus administration of intravenous contrast. CONTRAST:  160mL ISOVUE-300 IOPAMIDOL (ISOVUE-300) INJECTION 61% COMPARISON:  06/20/2012 FINDINGS: Lower chest:  Unremarkable. Hepatobiliary: Tiny subcapsular low-density lesion lateral segment left liver is unchanged compatible with a benign process. There is no evidence for gallstones, gallbladder wall thickening, or pericholecystic fluid. No intrahepatic or extrahepatic biliary dilation. Pancreas: No focal mass lesion.  No dilatation of the main duct. No intraparenchymal cyst. No peripancreatic edema. Spleen: No splenomegaly. No focal mass lesion. Adrenals/Urinary Tract: No adrenal nodule or mass. Kidneys are unremarkable. No evidence for hydroureter. Marked thickening left bladder wall noted. Stomach/Bowel: Stomach is nondistended. No gastric wall thickening. No evidence of outlet obstruction. Duodenum is normally positioned as is the ligament of Treitz. Insert duodenal diverticulum No small bowel wall thickening. No small bowel dilatation. The terminal ileum is normal. The appendix is normal. Sigmoid colonic wall remains thickened and ill-defined. In the interval since the prior study, tethering of the bladder wall to the sigmoid colon has developed (see image 43 of coronal series 5). This abnormal thickening  has associated fluid collection measuring 14 x 19 mm in diameter and extending 4.2 cm and oblique craniocaudal length. This abnormal fluid collection tracks posteriorly and inferiorly at the midline, involving the posterior bladder wall and extending down to the level of vaginal cuff. Vascular/Lymphatic: There is abdominal aortic atherosclerosis without aneurysm. There is no gastrohepatic or hepatoduodenal ligament lymphadenopathy. No intraperitoneal or retroperitoneal lymphadenopathy. No pelvic sidewall lymphadenopathy. Reproductive: Uterus is surgically absent. The vaginal cuff is tethered to the posterior wall of the bladder where the above described bladder wall thickening and fluid collection are evident. Other: No intraperitoneal free fluid. Musculoskeletal: Bone windows reveal no worrisome lytic or sclerotic osseous lesions. IMPRESSION: 1. Interval progression of inflammatory changes in the pelvis with sigmoid colon showing ill-defined wall thickening and now being tethered to the bladder which has developed fairly marked asymmetric bladder wall thickening. A rim enhancing fluid collection, compatible with abscess, is either in the posterior bladder wall or tracks immediately along the posterior bladder wall towards the vaginal cuff. Although colovesical fistula cannot be excluded, there is no evidence for gas in the urinary bladder. Imaging features also raise the question of colovaginal fistula although the apparent abscess terminates at the level of vaginal cuff with no air or fluid seen in the vaginal lumen. 2.  Aortic Atherosclerois (ICD10-170.0) Electronically Signed   By: Misty Stanley M.D.   On: 10/07/2017 18:27      Kalman Drape , [facility] Surgery 10/09/2017, 10:10 AM Pager: [phone] Consults: [phone] Mon-Fri 7:00 am-4:30 pm Sat-Sun 7:00 am-11:30 am

## 2017-10-09 NOTE — Progress Notes (Signed)
Patient ID: Morgan [name], female   DOB: June 10, 1951, 66 y.o.   MRN: [id number]  PROGRESS NOTE    Morgan [name]  RXV:[id number] DOB: 01-23-1951 DOA: 10/07/2017 PCP: [name], MD   Brief Narrative:  66 year old female with history of RA and recent admission from 09/15/2017-09/18/2017 at an outside hospital for diverticulitis and was discharged home on 10 day course of Omnicef and Flagyl which she completed on 09/25/2017; presented with worsening lower abdominal pain. CT abdomen showed sigmoid wall thickening with tethering to the bladder wall and enhancing fluid collection terminating at the level of the vaginal cuff. She was started on IV antibiotics and GI, general surgery and urology were consulted.   Assessment & Plan:   Principal Problem:   Abscess of sigmoid colon due to diverticulitis Active Problems:   Hyperlipemia   Essential hypertension   Fatty liver   Pelvic abscess in female   Immunosuppression due to drug therapy to Rheumatoid Arthritis   Left lower quadrant abdominal abscess (HCC)   Recurrent diverticulitis with abscess:  - GI, general surgery and IR following. Apparently, IR is unable to drain the abscess - Urology consult appreciated: No need for any urological intervention at this moment. CT showed no obvious communication between bladder and sigmoid, gas and bladder wall likely secondary to abscess but cannot totally exclude fistula - Continue current IV antibiotics.  - Diet advancement as per general surgery  - Pain management. - Repeat a.m. Labs  Rheumatoid arthritis: On golimumab, last infusion was held; plaqueniland chronic prednisone 5mg .  - Continue IV steroids while NPO, no indication for stress dosing currently - Restart plaquenil  Essential HTN:  - Monitor blood pressure. Restart home hydrochlorothiazide  Hyperlipidemia:  - Hold home medication for now  Hypokalemia: Resolved.   Thrombocytopenia: Chronic, stable.  - Monitor   DVT  prophylaxis: SCDs Code Status:  Full Family Communication: None at bedside Disposition Plan: Depends on clinical outcome  Consultants: GI, general surgery, urology  Procedures: None  Antimicrobials: Zosyn from 10/07/2017 onwards    Subjective: Patient seen and examined at bedside. She denies any overnight fever, nausea or vomiting. Her abdominal pain is improving.  Objective: Vitals:   10/08/17 1800 10/08/17 2218 10/09/17 0610 10/09/17 0844  BP: (!) 148/65 (!) 142/67 (!) 160/64 (!) 146/65  Pulse: 80 79 89 85  Resp: 18 18 18 18   Temp: 98.5 F (36.9 C) 98.3 F (36.8 C) 98.7 F (37.1 C) 97.7 F (36.5 C)  TempSrc: Oral Oral Oral Oral  SpO2: 98% 97% 98% 99%  Weight:      Height:        Intake/Output Summary (Last 24 hours) at 10/09/2017 1344 Last data filed at 10/09/2017 1306 Gross per 24 hour  Intake 1740 ml  Output 3575 ml  Net -1835 ml   Filed Weights   10/07/17 1540  Weight: 86.2 kg (190 lb)    Examination:  General exam: Appears calm and comfortable  Respiratory system: Bilateral decreased breath sound at bases Cardiovascular system: S1 & S2 heard, rate controlled  Gastrointestinal system: Abdomen is nondistended, soft and mildly tender in the left lower quadrant with no rebound tenderness. Normal bowel sounds heard. Extremities: No cyanosis, clubbing, edema    Data Reviewed: I have personally reviewed following labs and imaging studies  CBC: Recent Labs  Lab 10/07/17 1646 10/08/17 0602  WBC 16.7* 13.1*  NEUTROABS 11.4*  --   HGB 12.3 11.3*  HCT 38.3 34.9*  MCV 91.4 91.8  PLT 114*  99*   Basic Metabolic Panel: Recent Labs  Lab 10/07/17 1646 10/08/17 0602  NA 136 141  K 3.5 3.7  CL 100* 105  CO2 29 26  GLUCOSE 99 88  BUN 15 10  CREATININE 0.87 0.72  CALCIUM 9.7 8.9  MG  --  1.8  PHOS  --  3.4   GFR: Estimated Creatinine Clearance: 71.1 mL/min (by C-G formula based on SCr of 0.72 mg/dL). Liver Function Tests: Recent Labs  Lab  10/07/17 1646 10/08/17 0602  AST 23 20  ALT 17 16  ALKPHOS 49 42  BILITOT 0.3 0.5  PROT 7.5 6.6  ALBUMIN 3.7 3.4*   Recent Labs  Lab 10/07/17 1646  LIPASE 28   No results for input(s): AMMONIA in the last 168 hours. Coagulation Profile: No results for input(s): INR, PROTIME in the last 168 hours. Cardiac Enzymes: No results for input(s): CKTOTAL, CKMB, CKMBINDEX, TROPONINI in the last 168 hours. BNP (last 3 results) No results for input(s): PROBNP in the last 8760 hours. HbA1C: No results for input(s): HGBA1C in the last 72 hours. CBG: No results for input(s): GLUCAP in the last 168 hours. Lipid Profile: No results for input(s): CHOL, HDL, LDLCALC, TRIG, CHOLHDL, LDLDIRECT in the last 72 hours. Thyroid Function Tests: Recent Labs    10/08/17 0602  TSH 2.781   Anemia Panel: No results for input(s): VITAMINB12, FOLATE, FERRITIN, TIBC, IRON, RETICCTPCT in the last 72 hours. Sepsis Labs: No results for input(s): PROCALCITON, LATICACIDVEN in the last 168 hours.  Recent Results (from the past 240 hour(s))  Urine culture     Status: Abnormal   Collection Time: 10/07/17  3:58 PM  Result Value Ref Range Status   Specimen Description URINE, CLEAN CATCH  Final   Special Requests NONE  Final   Culture (A)  Final    <10,000 COLONIES/mL INSIGNIFICANT GROWTH Performed at [facility], [address], [place], [place] [address]    Report Status 10/09/2017 FINAL  Final         Radiology Studies: Ct Pelvis W Contrast  Result Date: 10/08/2017 CLINICAL DATA:  Rheumatoid arthritis.  Worsening abdominal pain. EXAM: CT PELVIS WITH CONTRAST TECHNIQUE: Multidetector CT imaging of the pelvis was performed using the standard protocol following the bolus administration of intravenous contrast. CONTRAST:  98mL ISOVUE-300 IOPAMIDOL (ISOVUE-300) INJECTION 61%, 68mL ISOVUE-300 IOPAMIDOL (ISOVUE-300) INJECTION 61% contrast administered into the bladder through a Foley catheter.  COMPARISON:  CT 10/07/2017 for FINDINGS: Urinary Tract: The bladder is distended with hand injected IV contrast through the Foley catheter. Foley catheter bulb noted within the the lumen of bladder. No bladder filling defect is present. This no evidence of communication between the bladder and the vagina or the sigmoid colon. There is a small gas collection along the serosal serosal surface of the bladder wall measuring 6 mm (image 59, series 5) which is LEFT adjacent to the bladder wall (image 19, series 2). This small pocket of gas is in same vicinity as the elongated fluid collections seen on comparison CT 1 day prior. Collection is less well demonstrated (image 22-18 series 2). Trace amount nondependent gas in the bladder. Contra in the sigmoid colon is favored contrast from CT 1 day prior. No Communication with the bladder. Bowel: Thickening of the sigmoid colon similar comparison exam. Circumferential bowel wall thickening to 110 mm. No macro perforation Vascular/Lymphatic: No pelvic lymphadenopathy. Reproductive:  Prostate normal Other:  None Musculoskeletal: No aggressive osseous lesion IMPRESSION: 1. No obvious communication between  the bladder and the sigmoid colon. However small gas collection along the bladder wall and small a gas within the bladder itself could indicate occult fistula. 2. High-density material within the sigmoid colon is favored contrast from CT 1 day prior. The bowel wall thickening of sigmoid colon suggests diverticulitis or segmental colitis. Similar comparison exam 3. Small gas collection the wall of bladder likely relates to prior abscess collection which appears slightly improved. 4. Small amount of nondependent gas within the bladder with differential iatrogenic from catheterization or occult fistula. Electronically Signed   By: Suzy Bouchard M.D.   On: 10/08/2017 13:51   Ct Abdomen Pelvis W Contrast  Result Date: 10/07/2017 CLINICAL DATA:  Low pelvic pain with a recent  diagnosis of diverticulitis. EXAM: CT ABDOMEN AND PELVIS WITH CONTRAST TECHNIQUE: Multidetector CT imaging of the abdomen and pelvis was performed using the standard protocol following bolus administration of intravenous contrast. CONTRAST:  177mL ISOVUE-300 IOPAMIDOL (ISOVUE-300) INJECTION 61% COMPARISON:  06/20/2012 FINDINGS: Lower chest:  Unremarkable. Hepatobiliary: Tiny subcapsular low-density lesion lateral segment left liver is unchanged compatible with a benign process. There is no evidence for gallstones, gallbladder wall thickening, or pericholecystic fluid. No intrahepatic or extrahepatic biliary dilation. Pancreas: No focal mass lesion. No dilatation of the main duct. No intraparenchymal cyst. No peripancreatic edema. Spleen: No splenomegaly. No focal mass lesion. Adrenals/Urinary Tract: No adrenal nodule or mass. Kidneys are unremarkable. No evidence for hydroureter. Marked thickening left bladder wall noted. Stomach/Bowel: Stomach is nondistended. No gastric wall thickening. No evidence of outlet obstruction. Duodenum is normally positioned as is the ligament of Treitz. Insert duodenal diverticulum No small bowel wall thickening. No small bowel dilatation. The terminal ileum is normal. The appendix is normal. Sigmoid colonic wall remains thickened and ill-defined. In the interval since the prior study, tethering of the bladder wall to the sigmoid colon has developed (see image 43 of coronal series 5). This abnormal thickening has associated fluid collection measuring 14 x 19 mm in diameter and extending 4.2 cm and oblique craniocaudal length. This abnormal fluid collection tracks posteriorly and inferiorly at the midline, involving the posterior bladder wall and extending down to the level of vaginal cuff. Vascular/Lymphatic: There is abdominal aortic atherosclerosis without aneurysm. There is no gastrohepatic or hepatoduodenal ligament lymphadenopathy. No intraperitoneal or retroperitoneal  lymphadenopathy. No pelvic sidewall lymphadenopathy. Reproductive: Uterus is surgically absent. The vaginal cuff is tethered to the posterior wall of the bladder where the above described bladder wall thickening and fluid collection are evident. Other: No intraperitoneal free fluid. Musculoskeletal: Bone windows reveal no worrisome lytic or sclerotic osseous lesions. IMPRESSION: 1. Interval progression of inflammatory changes in the pelvis with sigmoid colon showing ill-defined wall thickening and now being tethered to the bladder which has developed fairly marked asymmetric bladder wall thickening. A rim enhancing fluid collection, compatible with abscess, is either in the posterior bladder wall or tracks immediately along the posterior bladder wall towards the vaginal cuff. Although colovesical fistula cannot be excluded, there is no evidence for gas in the urinary bladder. Imaging features also raise the question of colovaginal fistula although the apparent abscess terminates at the level of vaginal cuff with no air or fluid seen in the vaginal lumen. 2.  Aortic Atherosclerois (ICD10-170.0) Electronically Signed   By: Misty Stanley M.D.   On: 10/07/2017 18:27        Scheduled Meds: . methylPREDNISolone (SOLU-MEDROL) injection  5.2 mg Intravenous Daily  . sodium chloride flush  3 mL Intravenous Q12H  Continuous Infusions: . piperacillin-tazobactam (ZOSYN)  IV 3.375 g (10/09/17 1031)     LOS: 2 days        [name], MD [facility] Pager [phone]  If 7PM-7AM, please contact night-coverage [email or web address] Password [email or web address] 10/09/2017, 1:44 PM

## 2017-10-10 DIAGNOSIS — D72829 Elevated white blood cell count, unspecified: Secondary | ICD-10-CM

## 2017-10-10 LAB — BASIC METABOLIC PANEL
ANION GAP: 7 (ref 5–15)
BUN: 8 mg/dL (ref 6–20)
CALCIUM: 9.5 mg/dL (ref 8.9–10.3)
CO2: 30 mmol/L (ref 22–32)
Chloride: 105 mmol/L (ref 101–111)
Creatinine, Ser: 0.66 mg/dL (ref 0.44–1.00)
Glucose, Bld: 98 mg/dL (ref 65–99)
POTASSIUM: 3.6 mmol/L (ref 3.5–5.1)
SODIUM: 142 mmol/L (ref 135–145)

## 2017-10-10 LAB — CBC WITH DIFFERENTIAL/PLATELET
BASOS ABS: 0 10*3/uL (ref 0.0–0.1)
BASOS PCT: 0 %
Eosinophils Absolute: 0.5 10*3/uL (ref 0.0–0.7)
Eosinophils Relative: 2 %
HCT: 36.2 % (ref 36.0–46.0)
Hemoglobin: 12.1 g/dL (ref 12.0–15.0)
LYMPHS ABS: 2.3 10*3/uL (ref 0.7–4.0)
LYMPHS PCT: 9 %
MCH: 30.2 pg (ref 26.0–34.0)
MCHC: 33.4 g/dL (ref 30.0–36.0)
MCV: 90.3 fL (ref 78.0–100.0)
MONO ABS: 4.4 10*3/uL — AB (ref 0.1–1.0)
Monocytes Relative: 17 %
Myelocytes: 2 %
NEUTROS PCT: 70 %
Neutro Abs: 18.8 10*3/uL — ABNORMAL HIGH (ref 1.7–7.7)
PLATELETS: 104 10*3/uL — AB (ref 150–400)
RBC: 4.01 MIL/uL (ref 3.87–5.11)
RDW: 14.8 % (ref 11.5–15.5)
WBC: 26 10*3/uL — AB (ref 4.0–10.5)

## 2017-10-10 LAB — MAGNESIUM: MAGNESIUM: 1.8 mg/dL (ref 1.7–2.4)

## 2017-10-10 MED ORDER — PREDNISONE 5 MG PO TABS
5.0000 mg | ORAL_TABLET | Freq: Every day | ORAL | Status: DC
  Administered 2017-10-11 – 2017-10-14 (×4): 5 mg via ORAL
  Filled 2017-10-10 (×4): qty 1

## 2017-10-10 NOTE — Progress Notes (Signed)
Pharmacy Antibiotic Note  Morgan [name] is a 66 y.o. female admitted on 10/07/2017 with intra-abdominal infection.  Pharmacy has been consulted for zosyn dosing.  10/10/2017 Scr 0.66, CrCl ~ 71 mls/min WBC elevated but on solumedrol afebrile  Plan: Continue Zosyn 3.375g IV Q8H infused over 4hrs. Pharmacy will sign off and follow peripherally  Height: 5' 3.75" (161.9 cm) Weight: 190 lb (86.2 kg) IBW/kg (Calculated) : 54.13  Temp (24hrs), Avg:98.1 F (36.7 C), Min:97.8 F (36.6 C), Max:98.5 F (36.9 C)  Recent Labs  Lab 10/07/17 1646 10/08/17 0602 10/10/17 0447  WBC 16.7* 13.1* 26.0*  CREATININE 0.87 0.72 0.66    Estimated Creatinine Clearance: 71.1 mL/min (by C-G formula based on SCr of 0.66 mg/dL).    Allergies  Allergen Reactions  . Remicade [Infliximab] Swelling and Other (See Comments)    Facial swelling, throat was closing up.     Antimicrobials this admission: 11/5 zosyn >>   Microbiology results: 11/5 UCx: <10K, insign growth     Thank you for allowing pharmacy to be a part of this patient's care.  Dolly Rias RPh 10/10/2017, 10:20 AM Pager [phone]

## 2017-10-10 NOTE — Progress Notes (Signed)
Patient ID: Morgan [name], female   DOB: [birth year], 66 y.o.   MRN: [id number]     Progress Note   Subjective   Says she is feeling pretty well, able to be up walking, tolerating clear liquids Foley remains in -urine very clear Had some sharp pains over bladder last night, not as bad this am No fever + sweats last night  WBC up to 26 today    Objective   Vital signs in last 24 hours: Temp:  [97.8 F (36.6 C)-98.5 F (36.9 C)] 97.8 F (36.6 C) (11/08 0540) Pulse Rate:  [67-84] 68 (11/08 0540) Resp:  [16-18] 16 (11/08 0540) BP: (144-151)/(60-74) 144/60 (11/08 0540) SpO2:  [97 %-99 %] 97 % (11/08 0540) Last BM Date: 10/09/17 General:    white female in NAD, husband at bedside Heart:  Regular rate and rhythm; no murmurs Lungs: Respirations even and unlabored, lungs CTA bilaterally Abdomen:  Soft, tender LLQ and suprapubic area,and nondistended. Normal bowel sounds. Extremities:  Without edema. Neurologic:  Alert and oriented,  grossly normal neurologically. Psych:  Cooperative. Normal mood and affect.  Intake/Output from previous day: 11/07 0701 - 11/08 0700 In: 610 [P.O.:460; IV Piggyback:150] Out: 6734 [Urine:4050] Intake/Output this shift: Total I/O In: 360 [P.O.:360] Out: 700 [Urine:700]  Lab Results: Recent Labs    10/07/17 1646 10/08/17 0602 10/10/17 0447  WBC 16.7* 13.1* 26.0*  HGB 12.3 11.3* 12.1  HCT 38.3 34.9* 36.2  PLT 114* 99* 104*   BMET Recent Labs    10/07/17 1646 10/08/17 0602 10/10/17 0447  NA 136 141 142  K 3.5 3.7 3.6  CL 100* 105 105  CO2 29 26 30   GLUCOSE 99 88 98  BUN 15 10 8   CREATININE 0.87 0.72 0.66  CALCIUM 9.7 8.9 9.5   LFT Recent Labs    10/08/17 0602  PROT 6.6  ALBUMIN 3.4*  AST 20  ALT 16  ALKPHOS 42  BILITOT 0.5   PT/INR No results for input(s): LABPROT, INR in the last 72 hours.  Studies/Results: Ct Pelvis W Contrast  Result Date: 10/08/2017 CLINICAL DATA:  Rheumatoid arthritis.  Worsening abdominal  pain. EXAM: CT PELVIS WITH CONTRAST TECHNIQUE: Multidetector CT imaging of the pelvis was performed using the standard protocol following the bolus administration of intravenous contrast. CONTRAST:  37mL ISOVUE-300 IOPAMIDOL (ISOVUE-300) INJECTION 61%, 64mL ISOVUE-300 IOPAMIDOL (ISOVUE-300) INJECTION 61% contrast administered into the bladder through a Foley catheter. COMPARISON:  CT 10/07/2017 for FINDINGS: Urinary Tract: The bladder is distended with hand injected IV contrast through the Foley catheter. Foley catheter bulb noted within the the lumen of bladder. No bladder filling defect is present. This no evidence of communication between the bladder and the vagina or the sigmoid colon. There is a small gas collection along the serosal serosal surface of the bladder wall measuring 6 mm (image 59, series 5) which is LEFT adjacent to the bladder wall (image 19, series 2). This small pocket of gas is in same vicinity as the elongated fluid collections seen on comparison CT 1 day prior. Collection is less well demonstrated (image 22-18 series 2). Trace amount nondependent gas in the bladder. Contra in the sigmoid colon is favored contrast from CT 1 day prior. No Communication with the bladder. Bowel: Thickening of the sigmoid colon similar comparison exam. Circumferential bowel wall thickening to 110 mm. No macro perforation Vascular/Lymphatic: No pelvic lymphadenopathy. Reproductive:  Prostate normal Other:  None Musculoskeletal: No aggressive osseous lesion IMPRESSION: 1. No obvious communication between the bladder  and the sigmoid colon. However small gas collection along the bladder wall and small a gas within the bladder itself could indicate occult fistula. 2. High-density material within the sigmoid colon is favored contrast from CT 1 day prior. The bowel wall thickening of sigmoid colon suggests diverticulitis or segmental colitis. Similar comparison exam 3. Small gas collection the wall of bladder likely  relates to prior abscess collection which appears slightly improved. 4. Small amount of nondependent gas within the bladder with differential iatrogenic from catheterization or occult fistula. Electronically Signed   By: Suzy Bouchard M.D.   On: 10/08/2017 13:51       Assessment / Plan:    #1 66 yo female with acute sigmoid diverticulitis initially treated elsewhere with brief hospitalization 09/15/17  And treated with IV abx fo r3 days then D/C home on 10 day course which she completed 10/24. Bladder adherent to colon on initial Ct. Sxs improved but never completely resolved.  Readmitted 11/5 with worsening  Lower abdominal  pain,urinary pressure and urgency-  And Ct consistent with persistent sigmoid diverticulitis , marked asymmetry of bladder wall  And a tubular abscess 76mm by 4.2 cm. CT cystogram - no fistula On Zosyn - day #4  Pt had some sharp pain last night, WBC abruptly up to 26,000      #2 RA - on chronic biologic RX  With Simponi - last dose 9 weeks ago, Plaquenil on hold  on 5 mg prednisone daily  #3 hx adenomatous polyps- last colon 08/2012, no recurrent polyps - currently still  Scheduled  Colon with Dr Henrene Pastor in December   Plan; Continue Zosyn Would not advance diet further than clears Believe she needs repeat CT - probably today - will discuss with surgery     Contact  Amy Eek, P.A.-C               [phone]      Principal Problem:   Abscess of sigmoid colon due to diverticulitis Active Problems:   Hyperlipemia   Essential hypertension   Fatty liver   Pelvic abscess in female   Immunosuppression due to drug therapy to Rheumatoid Arthritis   Left lower quadrant abdominal abscess ([place])     LOS: 3 days   Amy Esterwood  10/10/2017, 10:20 AM

## 2017-10-10 NOTE — Progress Notes (Signed)
[facility]/Trauma Progress Note      Assessment/Plan Active Problems: Hyperlipemia Essential hypertension Fatty liver Pelvic abscess in female Immunosuppression due to drug therapy to Rheumatoid Arthritis to include prednisone daily 5mg  and Simponi Aria  Diverticulitis with abscess - GI and Urology involved and appreciate their assistance - CT cystogram showed no obvious communication between bladder and sigmoid, gas in bladder wall likely 2/2 abscess but cannot fully exclude fistula -IR is unable to drain abscess -continue IV abx - WBC up today to 26 from 13.1 on 11/06  FEN: clears VTE: SCD;s ID: Zosyn 11/05>> Foley: yes Follow up: TBD  Plan: recheck WBC tomorrow and if still elevated then repeat CT scan.  Ideally this will resolve without need for surgical intervention and later elective surgery can be discussed although it is seeming like surgery may be indicated this admission with increase in WBC. Pt is on steroids so this may be skewing the WBC.    We will continue to follow.     LOS: 3 days    Subjective:  CC; abdominal pain  Pt states pain was worse overnight for about an hour but now pain is the same as yesterday. Tolerating clears, no nausea, vomiting, fever or chills. Discussed rechecking WBC tomorrow and if still elevated then repeat CT scan tomorrow. Pt was agreeable to plan.   Objective: Vital signs in last 24 hours: Temp:  [97.8 F (36.6 C)-98.5 F (36.9 C)] 97.8 F (36.6 C) (11/08 0540) Pulse Rate:  [67-84] 68 (11/08 0540) Resp:  [16-18] 16 (11/08 0540) BP: (144-151)/(60-74) 144/60 (11/08 0540) SpO2:  [97 %-99 %] 97 % (11/08 0540) Last BM Date: 10/09/17  Intake/Output from previous day: 11/07 0701 - 11/08 0700 In: 59 [P.O.:460; IV Piggyback:150] Out: 2426 [Urine:4050] Intake/Output this shift: Total I/O In: 360 [P.O.:360] Out: 700 [Urine:700]  PE: Gen:  Alert, NAD, pleasant, cooperative Card:  RRR, no M/G/R  heard Pulm:  Rate and effort normal Abd: Soft, obese, not distended, +BS, no HSM, mild TTP in suprapubic region and LLQ, no guarding or signs of peritonitis  Skin: no rashes noted, warm and dry    Anti-infectives: Anti-infectives (From admission, onward)   Start     Dose/Rate Route Frequency Ordered Stop   10/09/17 1400  hydroxychloroquine (PLAQUENIL) tablet 400 mg     400 mg Oral Daily 10/09/17 1356     10/08/17 0300  piperacillin-tazobactam (ZOSYN) IVPB 3.375 g     3.375 g 12.5 mL/hr over 240 Minutes Intravenous Every 8 hours 10/07/17 1900     10/07/17 1900  piperacillin-tazobactam (ZOSYN) IVPB 3.375 g     3.375 g 100 mL/hr over 30 Minutes Intravenous  Once 10/07/17 1853 10/07/17 1946      Lab Results:  Recent Labs    10/08/17 0602 10/10/17 0447  WBC 13.1* 26.0*  HGB 11.3* 12.1  HCT 34.9* 36.2  PLT 99* 104*   BMET Recent Labs    10/08/17 0602 10/10/17 0447  NA 141 142  K 3.7 3.6  CL 105 105  CO2 26 30  GLUCOSE 88 98  BUN 10 8  CREATININE 0.72 0.66  CALCIUM 8.9 9.5   PT/INR No results for input(s): LABPROT, INR in the last 72 hours. CMP     Component Value Date/Time   NA 142 10/10/2017 0447   NA 139 06/28/2017 0851   K 3.6 10/10/2017 0447   K 3.8 06/28/2017 0851   CL 105 10/10/2017 0447   CO2 30 10/10/2017 0447  CO2 28 06/28/2017 0851   GLUCOSE 98 10/10/2017 0447   GLUCOSE 94 06/28/2017 0851   BUN 8 10/10/2017 0447   BUN 21.4 06/28/2017 0851   CREATININE 0.66 10/10/2017 0447   CREATININE 1.0 06/28/2017 0851   CALCIUM 9.5 10/10/2017 0447   CALCIUM 10.8 (H) 06/28/2017 0851   PROT 6.6 10/08/2017 0602   PROT 7.9 06/28/2017 0851   ALBUMIN 3.4 (L) 10/08/2017 0602   ALBUMIN 4.2 06/28/2017 0851   AST 20 10/08/2017 0602   AST 29 06/28/2017 0851   ALT 16 10/08/2017 0602   ALT 19 06/28/2017 0851   ALKPHOS 42 10/08/2017 0602   ALKPHOS 49 06/28/2017 0851   BILITOT 0.5 10/08/2017 0602   BILITOT 0.68 06/28/2017 0851   GFRNONAA >60 10/10/2017 0447    GFRNONAA 61 11/18/2013 0813   GFRAA >60 10/10/2017 0447   GFRAA 71 11/18/2013 0813   Lipase     Component Value Date/Time   LIPASE 28 10/07/2017 1646    Studies/Results: Ct Pelvis W Contrast  Result Date: 10/08/2017 CLINICAL DATA:  Rheumatoid arthritis.  Worsening abdominal pain. EXAM: CT PELVIS WITH CONTRAST TECHNIQUE: Multidetector CT imaging of the pelvis was performed using the standard protocol following the bolus administration of intravenous contrast. CONTRAST:  55mL ISOVUE-300 IOPAMIDOL (ISOVUE-300) INJECTION 61%, 6mL ISOVUE-300 IOPAMIDOL (ISOVUE-300) INJECTION 61% contrast administered into the bladder through a Foley catheter. COMPARISON:  CT 10/07/2017 for FINDINGS: Urinary Tract: The bladder is distended with hand injected IV contrast through the Foley catheter. Foley catheter bulb noted within the the lumen of bladder. No bladder filling defect is present. This no evidence of communication between the bladder and the vagina or the sigmoid colon. There is a small gas collection along the serosal serosal surface of the bladder wall measuring 6 mm (image 59, series 5) which is LEFT adjacent to the bladder wall (image 19, series 2). This small pocket of gas is in same vicinity as the elongated fluid collections seen on comparison CT 1 day prior. Collection is less well demonstrated (image 22-18 series 2). Trace amount nondependent gas in the bladder. Contra in the sigmoid colon is favored contrast from CT 1 day prior. No Communication with the bladder. Bowel: Thickening of the sigmoid colon similar comparison exam. Circumferential bowel wall thickening to 110 mm. No macro perforation Vascular/Lymphatic: No pelvic lymphadenopathy. Reproductive:  Prostate normal Other:  None Musculoskeletal: No aggressive osseous lesion IMPRESSION: 1. No obvious communication between the bladder and the sigmoid colon. However small gas collection along the bladder wall and small a gas within the bladder itself  could indicate occult fistula. 2. High-density material within the sigmoid colon is favored contrast from CT 1 day prior. The bowel wall thickening of sigmoid colon suggests diverticulitis or segmental colitis. Similar comparison exam 3. Small gas collection the wall of bladder likely relates to prior abscess collection which appears slightly improved. 4. Small amount of nondependent gas within the bladder with differential iatrogenic from catheterization or occult fistula. Electronically Signed   By: Suzy Bouchard M.D.   On: 10/08/2017 13:51      Kalman Drape , [facility] 10/10/2017, 11:36 AM Pager: [phone] Consults: [phone] Mon-Fri 7:00 am-4:30 pm Sat-Sun 7:00 am-11:30 am

## 2017-10-10 NOTE — Progress Notes (Signed)
Patient ID: Morgan [name], female   DOB: 06-27-1951, 66 y.o.   MRN: [id number]  PROGRESS NOTE    Morgan [name]  JJH:[id number] DOB: [birth year] DOA: 10/07/2017 PCP: [name], MD   Brief Narrative:  66 year old female with history of RA and recent admission from 09/15/2017-09/18/2017 at an outside hospital for diverticulitis and was discharged home on 10 day course of Omnicef and Flagyl which she completed on 09/25/2017; presented with worsening lower abdominal pain. CT abdomen showed sigmoid wall thickening with tethering to the bladder wall and enhancing fluid collection terminating at the level of the vaginal cuff. She was started on IV antibiotics and GI, general surgery and urology were consulted.   Assessment & Plan:   Principal Problem:   Abscess of sigmoid colon due to diverticulitis Active Problems:   Hyperlipemia   Essential hypertension   Fatty liver   Pelvic abscess in female   Immunosuppression due to drug therapy to Rheumatoid Arthritis   Left lower quadrant abdominal abscess (HCC)   Recurrent diverticulitis with abscess:  - GI, general surgery and IR following. Apparently, IR is unable to drain the abscess - Urology consult appreciated: No need for any urological intervention at this moment. CT showed no obvious communication between bladder and sigmoid, gas in the bladder wall likely secondary to abscess but cannot totally exclude fistula - Continue current IV antibiotics. Patient complains of more pain today with worsening leukocytosis. Follow further recommendations from general surgery.  - Diet advancement as per general surgery  - Pain management. - Repeat a.m. Labs  Leukocytosis - Probably secondary to above. Worsening. If white count keeps worsening, might consider repeating CAT scan and probably switch antibiotics to meropenem - Continue Zosyn for now  Rheumatoid arthritis: On golimumab, last infusion was held; plaqueniland chronic prednisone 5mg .    - Continue plaquenil - Switch to home dose of oral prednisone  Essential HTN:  - Monitor blood pressure. Continue hydrochlorothiazide  Hyperlipidemia:  - Hold home medication for now  Hypokalemia: Resolved.   Thrombocytopenia: Chronic, stable.  - Monitor   DVT prophylaxis: SCDs Code Status:  Full Family Communication: None at bedside Disposition Plan: Depends on clinical outcome  Consultants: GI, general surgery, urology  Procedures: None  Antimicrobials: Zosyn from 10/07/2017 onwards    Subjective: Patient seen and examined at bedside. She complains of worsening abdominal pain today. She denies any overnight fever, nausea or vomiting.  Objective: Vitals:   10/09/17 0844 10/09/17 1451 10/09/17 2137 10/10/17 0540  BP: (!) 146/65 (!) 151/74 (!) 144/64 (!) 144/60  Pulse: 85 84 67 68  Resp: 18 18 18 16   Temp: 97.7 F (36.5 C) 98 F (36.7 C) 98.5 F (36.9 C) 97.8 F (36.6 C)  TempSrc: Oral Oral Oral Oral  SpO2: 99% 99% 98% 97%  Weight:      Height:        Intake/Output Summary (Last 24 hours) at 10/10/2017 1039 Last data filed at 10/10/2017 0858 Gross per 24 hour  Intake 970 ml  Output 4750 ml  Net -3780 ml   Filed Weights   10/07/17 1540  Weight: 86.2 kg (190 lb)    Examination:  General exam: Appears calm and comfortable  Respiratory system: Bilateral decreased breath sound at bases Cardiovascular system: S1 & S2 heard, rate controlled  Gastrointestinal system: Abdomen is nondistended, soft and more tender today in the left lower quadrant with no rebound tenderness. Normal bowel sounds heard. Extremities: No cyanosis, clubbing, edema    Data Reviewed:  I have personally reviewed following labs and imaging studies  CBC: Recent Labs  Lab 10/07/17 1646 10/08/17 0602 10/10/17 0447  WBC 16.7* 13.1* 26.0*  NEUTROABS 11.4*  --  18.8*  HGB 12.3 11.3* 12.1  HCT 38.3 34.9* 36.2  MCV 91.4 91.8 90.3  PLT 114* 99* 323*   Basic Metabolic  Panel: Recent Labs  Lab 10/07/17 1646 10/08/17 0602 10/10/17 0447  NA 136 141 142  K 3.5 3.7 3.6  CL 100* 105 105  CO2 29 26 30   GLUCOSE 99 88 98  BUN 15 10 8   CREATININE 0.87 0.72 0.66  CALCIUM 9.7 8.9 9.5  MG  --  1.8 1.8  PHOS  --  3.4  --    GFR: Estimated Creatinine Clearance: 71.1 mL/min (by C-G formula based on SCr of 0.66 mg/dL). Liver Function Tests: Recent Labs  Lab 10/07/17 1646 10/08/17 0602  AST 23 20  ALT 17 16  ALKPHOS 49 42  BILITOT 0.3 0.5  PROT 7.5 6.6  ALBUMIN 3.7 3.4*   Recent Labs  Lab 10/07/17 1646  LIPASE 28   No results for input(s): AMMONIA in the last 168 hours. Coagulation Profile: No results for input(s): INR, PROTIME in the last 168 hours. Cardiac Enzymes: No results for input(s): CKTOTAL, CKMB, CKMBINDEX, TROPONINI in the last 168 hours. BNP (last 3 results) No results for input(s): PROBNP in the last 8760 hours. HbA1C: No results for input(s): HGBA1C in the last 72 hours. CBG: No results for input(s): GLUCAP in the last 168 hours. Lipid Profile: No results for input(s): CHOL, HDL, LDLCALC, TRIG, CHOLHDL, LDLDIRECT in the last 72 hours. Thyroid Function Tests: Recent Labs    10/08/17 0602  TSH 2.781   Anemia Panel: No results for input(s): VITAMINB12, FOLATE, FERRITIN, TIBC, IRON, RETICCTPCT in the last 72 hours. Sepsis Labs: No results for input(s): PROCALCITON, LATICACIDVEN in the last 168 hours.  Recent Results (from the past 240 hour(s))  Urine culture     Status: Abnormal   Collection Time: 10/07/17  3:58 PM  Result Value Ref Range Status   Specimen Description URINE, CLEAN CATCH  Final   Special Requests NONE  Final   Culture (A)  Final    <10,000 COLONIES/mL INSIGNIFICANT GROWTH Performed at [facility], [address] [place], [place] [address]    Report Status 10/09/2017 FINAL  Final         Radiology Studies: Ct Pelvis W Contrast  Result Date: 10/08/2017 CLINICAL DATA:  Rheumatoid  arthritis.  Worsening abdominal pain. EXAM: CT PELVIS WITH CONTRAST TECHNIQUE: Multidetector CT imaging of the pelvis was performed using the standard protocol following the bolus administration of intravenous contrast. CONTRAST:  17mL ISOVUE-300 IOPAMIDOL (ISOVUE-300) INJECTION 61%, 59mL ISOVUE-300 IOPAMIDOL (ISOVUE-300) INJECTION 61% contrast administered into the bladder through a Foley catheter. COMPARISON:  CT 10/07/2017 for FINDINGS: Urinary Tract: The bladder is distended with hand injected IV contrast through the Foley catheter. Foley catheter bulb noted within the the lumen of bladder. No bladder filling defect is present. This no evidence of communication between the bladder and the vagina or the sigmoid colon. There is a small gas collection along the serosal serosal surface of the bladder wall measuring 6 mm (image 59, series 5) which is LEFT adjacent to the bladder wall (image 19, series 2). This small pocket of gas is in same vicinity as the elongated fluid collections seen on comparison CT 1 day prior. Collection is less well demonstrated (image 22-18 series 2).  Trace amount nondependent gas in the bladder. Contra in the sigmoid colon is favored contrast from CT 1 day prior. No Communication with the bladder. Bowel: Thickening of the sigmoid colon similar comparison exam. Circumferential bowel wall thickening to 110 mm. No macro perforation Vascular/Lymphatic: No pelvic lymphadenopathy. Reproductive:  Prostate normal Other:  None Musculoskeletal: No aggressive osseous lesion IMPRESSION: 1. No obvious communication between the bladder and the sigmoid colon. However small gas collection along the bladder wall and small a gas within the bladder itself could indicate occult fistula. 2. High-density material within the sigmoid colon is favored contrast from CT 1 day prior. The bowel wall thickening of sigmoid colon suggests diverticulitis or segmental colitis. Similar comparison exam 3. Small gas  collection the wall of bladder likely relates to prior abscess collection which appears slightly improved. 4. Small amount of nondependent gas within the bladder with differential iatrogenic from catheterization or occult fistula. Electronically Signed   By: Suzy Bouchard M.D.   On: 10/08/2017 13:51        Scheduled Meds: . hydrochlorothiazide  25 mg Oral Daily  . hydroxychloroquine  400 mg Oral Daily  . methylPREDNISolone (SOLU-MEDROL) injection  5.2 mg Intravenous Daily  . sodium chloride flush  3 mL Intravenous Q12H   Continuous Infusions: . piperacillin-tazobactam (ZOSYN)  IV Stopped (10/10/17 0750)     LOS: 3 days        [name], MD [facility] Pager [phone]  If 7PM-7AM, please contact night-coverage [email or web address] Password [facility] 10/10/2017, 10:39 AM

## 2017-10-11 ENCOUNTER — Inpatient Hospital Stay (HOSPITAL_COMMUNITY): Payer: BLUE CROSS/BLUE SHIELD

## 2017-10-11 ENCOUNTER — Encounter (HOSPITAL_COMMUNITY): Payer: Self-pay | Admitting: Radiology

## 2017-10-11 DIAGNOSIS — Z86010 Personal history of colonic polyps: Secondary | ICD-10-CM

## 2017-10-11 DIAGNOSIS — E876 Hypokalemia: Secondary | ICD-10-CM

## 2017-10-11 DIAGNOSIS — M069 Rheumatoid arthritis, unspecified: Secondary | ICD-10-CM

## 2017-10-11 LAB — COMPREHENSIVE METABOLIC PANEL
ALT: 17 U/L (ref 14–54)
AST: 25 U/L (ref 15–41)
Albumin: 3.5 g/dL (ref 3.5–5.0)
Alkaline Phosphatase: 45 U/L (ref 38–126)
Anion gap: 9 (ref 5–15)
BUN: 8 mg/dL (ref 6–20)
CHLORIDE: 102 mmol/L (ref 101–111)
CO2: 28 mmol/L (ref 22–32)
CREATININE: 0.81 mg/dL (ref 0.44–1.00)
Calcium: 9.7 mg/dL (ref 8.9–10.3)
GFR calc Af Amer: >60 mL/min (ref >60)
GFR calc non Af Amer: >60 mL/min (ref >60)
Glucose, Bld: 96 mg/dL (ref 65–99)
POTASSIUM: 3.4 mmol/L — AB (ref 3.5–5.1)
SODIUM: 139 mmol/L (ref 135–145)
Total Bilirubin: 0.6 mg/dL (ref 0.3–1.2)
Total Protein: 7.4 g/dL (ref 6.5–8.1)

## 2017-10-11 LAB — CBC WITH DIFFERENTIAL/PLATELET
Basophils Absolute: 0 10*3/uL (ref 0.0–0.1)
Basophils Relative: 0 %
EOS ABS: 0.2 10*3/uL (ref 0.0–0.7)
EOS PCT: 1 %
HCT: 37.9 % (ref 36.0–46.0)
Hemoglobin: 12.3 g/dL (ref 12.0–15.0)
Lymphocytes Relative: 15 %
Lymphs Abs: 2.4 10*3/uL (ref 0.7–4.0)
MCH: 29.5 pg (ref 26.0–34.0)
MCHC: 32.5 g/dL (ref 30.0–36.0)
MCV: 90.9 fL (ref 78.0–100.0)
MONO ABS: 4 10*3/uL — AB (ref 0.1–1.0)
Monocytes Relative: 25 %
NEUTROS PCT: 59 %
Neutro Abs: 9.3 10*3/uL — ABNORMAL HIGH (ref 1.7–7.7)
PLATELETS: 114 10*3/uL — AB (ref 150–400)
RBC: 4.17 MIL/uL (ref 3.87–5.11)
RDW: 15.4 % (ref 11.5–15.5)
WBC: 15.9 10*3/uL — AB (ref 4.0–10.5)

## 2017-10-11 LAB — MAGNESIUM: Magnesium: 1.8 mg/dL (ref 1.7–2.4)

## 2017-10-11 MED ORDER — IOPAMIDOL (ISOVUE-300) INJECTION 61%
INTRAVENOUS | Status: AC
  Filled 2017-10-11: qty 100

## 2017-10-11 MED ORDER — IOPAMIDOL (ISOVUE-300) INJECTION 61%
INTRAVENOUS | Status: AC
  Filled 2017-10-11: qty 30

## 2017-10-11 MED ORDER — IOPAMIDOL (ISOVUE-300) INJECTION 61%
30.0000 mL | Freq: Once | INTRAVENOUS | Status: AC | PRN
  Administered 2017-10-11: 30 mL via ORAL

## 2017-10-11 MED ORDER — IOPAMIDOL (ISOVUE-300) INJECTION 61%
100.0000 mL | Freq: Once | INTRAVENOUS | Status: AC | PRN
  Administered 2017-10-11: 100 mL via INTRAVENOUS

## 2017-10-11 MED ORDER — POTASSIUM CHLORIDE CRYS ER 20 MEQ PO TBCR
60.0000 meq | EXTENDED_RELEASE_TABLET | Freq: Once | ORAL | Status: AC
  Administered 2017-10-11: 60 meq via ORAL
  Filled 2017-10-11: qty 3

## 2017-10-11 MED ORDER — IOPAMIDOL (ISOVUE-300) INJECTION 61%: 15.0000 mL | Freq: Once | INTRAVENOUS | Status: AC | PRN

## 2017-10-11 NOTE — Progress Notes (Signed)
Patient ID: Morgan [name], female   DOB: 1951/06/10, 66 y.o.   MRN: [id number]     Progress Note   Subjective   Patient denies any further sharp left lower quadrant or suprapubic pains, continues to have discomfort. She had a bowel movement this morning, also has had Foley removed and is urinating without difficulty Scheduled for CT today   Objective   Vital signs in last 24 hours: Temp:  [97.9 F (36.6 C)-98.3 F (36.8 C)] 98.3 F (36.8 C) (11/09 0531) Pulse Rate:  [64-77] 67 (11/09 0531) Resp:  [15-18] 18 (11/09 0531) BP: (115-141)/(55-72) 128/72 (11/09 0531) SpO2:  [97 %-99 %] 98 % (11/09 0531) Last BM Date: 10/11/17 General:    white female in NAD Heart:  Regular rate and rhythm; no murmurs Lungs: Respirations even and unlabored, lungs CTA bilaterally Abdomen:  Soft, tender LLQ, and suprapubic,and nondistended. Normal bowel sounds. Extremities:  Without edema. Neurologic:  Alert and oriented,  grossly normal neurologically. Psych:  Cooperative. Normal mood and affect.  Intake/Output from previous day: 11/08 0701 - 11/09 0700 In: 650 [P.O.:600; IV Piggyback:50] Out: 1324 [Urine:3950] Intake/Output this shift: Total I/O In: 100 [IV Piggyback:100] Out: 1000 [Urine:1000]  Lab Results: Recent Labs    10/10/17 0447 10/11/17 0437  WBC 26.0* 15.9*  HGB 12.1 12.3  HCT 36.2 37.9  PLT 104* 114*   BMET Recent Labs    10/10/17 0447 10/11/17 0437  NA 142 139  K 3.6 3.4*  CL 105 102  CO2 30 28  GLUCOSE 98 96  BUN 8 8  CREATININE 0.66 0.81  CALCIUM 9.5 9.7   LFT Recent Labs    10/11/17 0437  PROT 7.4  ALBUMIN 3.5  AST 25  ALT 17  ALKPHOS 45  BILITOT 0.6   PT/INR No results for input(s): LABPROT, INR in the last 72 hours.     Assessment / Plan:     #75 66 year old female with refractory sigmoid diverticulitis complicated by bladder adherence to bowel/inflammatory process and abscess No colovesicular fistula demonstrated on cystogram 2 days  ago Patient had some increasing pain yesterday and spiking WBC which has improved somewhat today.  #2 rheumatoid arthritis-patient on chronic biologic therapy/Simponi-has not had over the past 9 weeks. Had also been maintained on Plaquenil and low-dose prednisone Other drugs on hold, she has been on very low-dose prednisone this admission at 5 mg daily  #3 history of adenomatous colon polyps-last colon September 2013 with no recurrent polyps-due for  interval follow-up December 2018 and scheduled currently for colonoscopy with [name] in December 2018   Plan; continue IV Zosyn-day #5 Follow-up CT abdomen and pelvis today, further decisions pending findings.   Contact  Teaghan Melrose Forrest, P.A.-C               [phone]      Principal Problem:   Abscess of sigmoid colon due to diverticulitis Active Problems:   Hyperlipemia   Essential hypertension   Fatty liver   Pelvic abscess in female   Immunosuppression due to drug therapy to Rheumatoid Arthritis   Left lower quadrant abdominal abscess (HCC)     LOS: 4 days   Telesforo Brosnahan  10/11/2017, 10:58 AM

## 2017-10-11 NOTE — Progress Notes (Signed)
Patient ID: Morgan [name], female   DOB: [birth year], 66 y.o.   MRN: [id number]  PROGRESS NOTE    Morgan [name]  XFG:[id number] DOB: 1951/04/27 DOA: 10/07/2017 PCP: [name], MD   Brief Narrative:  66 year old female with history of RA and recent admission from 09/15/2017-09/18/2017 at an outside hospital for diverticulitis and was discharged home on 10 day course of Omnicef and Flagyl which she completed on 09/25/2017; presented with worsening lower abdominal pain. CT abdomen showed sigmoid wall thickening with tethering to the bladder wall and enhancing fluid collection terminating at the level of the vaginal cuff. She was started on IV antibiotics and GI, general surgery and urology were consulted.   Assessment & Plan:   Principal Problem:   Abscess of sigmoid colon due to diverticulitis Active Problems:   Hyperlipemia   Essential hypertension   Fatty liver   Pelvic abscess in female   Immunosuppression due to drug therapy to Rheumatoid Arthritis   Left lower quadrant abdominal abscess (HCC)   Recurrent diverticulitis with abscess:  - GI, general surgery and IR following. Apparently, IR is unable to drain the abscess - Urology consult appreciated: No need for any urological intervention at this moment. CT showed no obvious communication between bladder and sigmoid, gas in the bladder wall likely secondary to abscess but cannot totally exclude fistula - Continue current IV antibiotics. Pain is much improved since yesterday with improving leukocytosis. CAT scan scheduled for today as per general surgery. Follow further recommendations from general surgery - Diet advancement as per general surgery  - Pain management. - Repeat a.m. Labs  Leukocytosis - Probably secondary to above. Improving - Continue Zosyn for now  Rheumatoid arthritis: On golimumab, last infusion was held; plaqueniland chronic prednisone 5mg .  - Continue plaquenil - Continue home oral prednisone  dose  Essential HTN:  - Monitor blood pressure. Continue hydrochlorothiazide  Hyperlipidemia:  - Hold home medication for now  Hypokalemia: Replace. Repeat a.m. labs  Thrombocytopenia: Chronic, stable.  - Monitor   DVT prophylaxis: SCDs Code Status:  Full Family Communication: None at bedside Disposition Plan: Depends on clinical outcome  Consultants: GI, general surgery, urology  Procedures: None  Antimicrobials: Zosyn from 10/07/2017 onwards    Subjective: Patient seen and examined at bedside. She she feels better than yesterday and pain is improving. She denies any overnight fever, nausea or vomiting.  Objective: Vitals:   10/10/17 1419 10/10/17 1823 10/10/17 2150 10/11/17 0531  BP: (!) 141/64 (!) 115/55 136/71 128/72  Pulse: 64 77 67 67  Resp: 16 16 15 18   Temp: 98.2 F (36.8 C) 97.9 F (36.6 C) 97.9 F (36.6 C) 98.3 F (36.8 C)  TempSrc: Oral Oral Oral Oral  SpO2: 99% 99% 97% 98%  Weight:      Height:        Intake/Output Summary (Last 24 hours) at 10/11/2017 9678 Last data filed at 10/11/2017 0800 Gross per 24 hour  Intake 650 ml  Output 4200 ml  Net -3550 ml   Filed Weights   10/07/17 1540  Weight: 86.2 kg (190 lb)    Examination:  General exam: Appears calm and comfortable  Respiratory system: Bilateral decreased breath sound at bases Cardiovascular system: S1 & S2 heard, rate controlled  Gastrointestinal system: Abdomen is nondistended, soft and very mildly tender today in the left lower quadrant with no rebound tenderness. Normal bowel sounds heard. Extremities: No cyanosis, clubbing, edema    Data Reviewed: I have personally reviewed following labs and  imaging studies  CBC: Recent Labs  Lab 10/07/17 1646 10/08/17 0602 10/10/17 0447 10/11/17 0437  WBC 16.7* 13.1* 26.0* 15.9*  NEUTROABS 11.4*  --  18.8* 9.3*  HGB 12.3 11.3* 12.1 12.3  HCT 38.3 34.9* 36.2 37.9  MCV 91.4 91.8 90.3 90.9  PLT 114* 99* 104* 114*   Basic  Metabolic Panel: Recent Labs  Lab 10/07/17 1646 10/08/17 0602 10/10/17 0447 10/11/17 0437  NA 136 141 142 139  K 3.5 3.7 3.6 3.4*  CL 100* 105 105 102  CO2 29 26 30 28   GLUCOSE 99 88 98 96  BUN 15 10 8 8   CREATININE 0.87 0.72 0.66 0.81  CALCIUM 9.7 8.9 9.5 9.7  MG  --  1.8 1.8 1.8  PHOS  --  3.4  --   --    GFR: Estimated Creatinine Clearance: 70.2 mL/min (by C-G formula based on SCr of 0.81 mg/dL). Liver Function Tests: Recent Labs  Lab 10/07/17 1646 10/08/17 0602 10/11/17 0437  AST 23 20 25   ALT 17 16 17   ALKPHOS 49 42 45  BILITOT 0.3 0.5 0.6  PROT 7.5 6.6 7.4  ALBUMIN 3.7 3.4* 3.5   Recent Labs  Lab 10/07/17 1646  LIPASE 28   No results for input(s): AMMONIA in the last 168 hours. Coagulation Profile: No results for input(s): INR, PROTIME in the last 168 hours. Cardiac Enzymes: No results for input(s): CKTOTAL, CKMB, CKMBINDEX, TROPONINI in the last 168 hours. BNP (last 3 results) No results for input(s): PROBNP in the last 8760 hours. HbA1C: No results for input(s): HGBA1C in the last 72 hours. CBG: No results for input(s): GLUCAP in the last 168 hours. Lipid Profile: No results for input(s): CHOL, HDL, LDLCALC, TRIG, CHOLHDL, LDLDIRECT in the last 72 hours. Thyroid Function Tests: No results for input(s): TSH, T4TOTAL, FREET4, T3FREE, THYROIDAB in the last 72 hours. Anemia Panel: No results for input(s): VITAMINB12, FOLATE, FERRITIN, TIBC, IRON, RETICCTPCT in the last 72 hours. Sepsis Labs: No results for input(s): PROCALCITON, LATICACIDVEN in the last 168 hours.  Recent Results (from the past 240 hour(s))  Urine culture     Status: Abnormal   Collection Time: 10/07/17  3:58 PM  Result Value Ref Range Status   Specimen Description URINE, CLEAN CATCH  Final   Special Requests NONE  Final   Culture (A)  Final    <10,000 COLONIES/mL INSIGNIFICANT GROWTH Performed at [facility], [address], [place], [place] [address]    Report Status  10/09/2017 FINAL  Final         Radiology Studies: No results found.      Scheduled Meds: . hydrochlorothiazide  25 mg Oral Daily  . hydroxychloroquine  400 mg Oral Daily  . predniSONE  5 mg Oral Daily  . sodium chloride flush  3 mL Intravenous Q12H   Continuous Infusions: . piperacillin-tazobactam (ZOSYN)  IV Stopped (10/11/17 0800)     LOS: 4 days        [name], MD [facility] Pager [phone]  If 7PM-7AM, please contact night-coverage [email or web address] Password [email or web address] 10/11/2017, 8:24 AM

## 2017-10-11 NOTE — Progress Notes (Signed)
[facility]/Trauma Progress Note      Assessment/Plan Active Problems: Hyperlipemia Essential hypertension Fatty liver Pelvic abscess in female Immunosuppression due to drug therapy to Rheumatoid Arthritis to include prednisone daily 5mg  and Simponi Aria  Diverticulitis with abscess - GI and Urology involved and appreciate their assistance - CT cystogramshowed no obvious communication between bladder and sigmoid, gas in bladder wall likely 2/2 abscess but cannot fully exclude fistula, dc'd foley -IR is unable to drain abscess -continue IV abx - WBC down today to 15.9 from 26 yesterday (11/08) - repeat CT scan pending  FEN: clears VTE: SCD;s ID: Zosyn 11/05>> Foley: yes Follow up: TBD  Plan:repeat CT scan. Pt may need PICC and IV abx at home if no surgery is indicated this admission.  We will continue to follow.    LOS: 4 days    Subjective: CC: abdominal pain  Less pain. Had normal BM today without pain. Tolerating clears. Daughter at bedside. No fever or chills.   Objective: Vital signs in last 24 hours: Temp:  [97.9 F (36.6 C)-98.3 F (36.8 C)] 98.3 F (36.8 C) (11/09 0531) Pulse Rate:  [64-77] 67 (11/09 0531) Resp:  [15-18] 18 (11/09 0531) BP: (115-141)/(55-72) 128/72 (11/09 0531) SpO2:  [97 %-99 %] 98 % (11/09 0531) Last BM Date: 10/11/17  Intake/Output from previous day: 11/08 0701 - 11/09 0700 In: 650 [P.O.:600; IV Piggyback:50] Out: 8786 [Urine:3950] Intake/Output this shift: Total I/O In: 100 [IV Piggyback:100] Out: 1000 [Urine:1000]  PE: Gen: Alert, NAD, pleasant, cooperative Card: RRR, no M/G/R heard Pulm:Rate andeffort normal Abd: Soft,obese, not distended, +BS, no HSM,mild TTP in suprapubic region and LLQ, no guarding or signs of peritonitis Skin: no rashes noted, warm and dry    Anti-infectives: Anti-infectives (From admission, onward)   Start     Dose/Rate Route Frequency Ordered Stop    10/09/17 1400  hydroxychloroquine (PLAQUENIL) tablet 400 mg     400 mg Oral Daily 10/09/17 1356     10/08/17 0300  piperacillin-tazobactam (ZOSYN) IVPB 3.375 g     3.375 g 12.5 mL/hr over 240 Minutes Intravenous Every 8 hours 10/07/17 1900     10/07/17 1900  piperacillin-tazobactam (ZOSYN) IVPB 3.375 g     3.375 g 100 mL/hr over 30 Minutes Intravenous  Once 10/07/17 1853 10/07/17 1946      Lab Results:  Recent Labs    10/10/17 0447 10/11/17 0437  WBC 26.0* 15.9*  HGB 12.1 12.3  HCT 36.2 37.9  PLT 104* 114*   BMET Recent Labs    10/10/17 0447 10/11/17 0437  NA 142 139  K 3.6 3.4*  CL 105 102  CO2 30 28  GLUCOSE 98 96  BUN 8 8  CREATININE 0.66 0.81  CALCIUM 9.5 9.7   PT/INR No results for input(s): LABPROT, INR in the last 72 hours. CMP     Component Value Date/Time   NA 139 10/11/2017 0437   NA 139 06/28/2017 0851   K 3.4 (L) 10/11/2017 0437   K 3.8 06/28/2017 0851   CL 102 10/11/2017 0437   CO2 28 10/11/2017 0437   CO2 28 06/28/2017 0851   GLUCOSE 96 10/11/2017 0437   GLUCOSE 94 06/28/2017 0851   BUN 8 10/11/2017 0437   BUN 21.4 06/28/2017 0851   CREATININE 0.81 10/11/2017 0437   CREATININE 1.0 06/28/2017 0851   CALCIUM 9.7 10/11/2017 0437   CALCIUM 10.8 (H) 06/28/2017 0851   PROT 7.4 10/11/2017 0437   PROT 7.9 06/28/2017 0851  ALBUMIN 3.5 10/11/2017 0437   ALBUMIN 4.2 06/28/2017 0851   AST 25 10/11/2017 0437   AST 29 06/28/2017 0851   ALT 17 10/11/2017 0437   ALT 19 06/28/2017 0851   ALKPHOS 45 10/11/2017 0437   ALKPHOS 49 06/28/2017 0851   BILITOT 0.6 10/11/2017 0437   BILITOT 0.68 06/28/2017 0851   GFRNONAA >60 10/11/2017 0437   GFRNONAA 61 11/18/2013 0813   GFRAA >60 10/11/2017 0437   GFRAA 71 11/18/2013 0813   Lipase     Component Value Date/Time   LIPASE 28 10/07/2017 1646    Studies/Results: No results found.    Kalman Drape , [facility] 10/11/2017, 11:43 AM Pager: [phone] Consults:  [phone] Mon-Fri 7:00 am-4:30 pm Sat-Sun 7:00 am-11:30 am

## 2017-10-12 DIAGNOSIS — Z8349 Family history of other endocrine, nutritional and metabolic diseases: Secondary | ICD-10-CM

## 2017-10-12 DIAGNOSIS — Z888 Allergy status to other drugs, medicaments and biological substances status: Secondary | ICD-10-CM

## 2017-10-12 DIAGNOSIS — Z7952 Long term (current) use of systemic steroids: Secondary | ICD-10-CM

## 2017-10-12 DIAGNOSIS — Z833 Family history of diabetes mellitus: Secondary | ICD-10-CM

## 2017-10-12 DIAGNOSIS — Z8249 Family history of ischemic heart disease and other diseases of the circulatory system: Secondary | ICD-10-CM

## 2017-10-12 DIAGNOSIS — Z87891 Personal history of nicotine dependence: Secondary | ICD-10-CM

## 2017-10-12 DIAGNOSIS — Z803 Family history of malignant neoplasm of breast: Secondary | ICD-10-CM

## 2017-10-12 DIAGNOSIS — Z8379 Family history of other diseases of the digestive system: Secondary | ICD-10-CM

## 2017-10-12 LAB — CBC WITH DIFFERENTIAL/PLATELET
BASOS PCT: 0 %
Basophils Absolute: 0 10*3/uL (ref 0.0–0.1)
EOS ABS: 0.1 10*3/uL (ref 0.0–0.7)
Eosinophils Relative: 1 %
HEMATOCRIT: 39.2 % (ref 36.0–46.0)
HEMOGLOBIN: 12.9 g/dL (ref 12.0–15.0)
LYMPHS PCT: 18 %
Lymphs Abs: 2 10*3/uL (ref 0.7–4.0)
MCH: 30.1 pg (ref 26.0–34.0)
MCHC: 32.9 g/dL (ref 30.0–36.0)
MCV: 91.6 fL (ref 78.0–100.0)
MONOS PCT: 28 %
Monocytes Absolute: 3.1 10*3/uL — ABNORMAL HIGH (ref 0.1–1.0)
NEUTROS ABS: 5.7 10*3/uL (ref 1.7–7.7)
NEUTROS PCT: 53 %
Platelets: 118 10*3/uL — ABNORMAL LOW (ref 150–400)
RBC: 4.28 MIL/uL (ref 3.87–5.11)
RDW: 15.6 % — ABNORMAL HIGH (ref 11.5–15.5)
WBC: 10.9 10*3/uL — ABNORMAL HIGH (ref 4.0–10.5)

## 2017-10-12 LAB — BASIC METABOLIC PANEL
Anion gap: 7 (ref 5–15)
BUN: 6 mg/dL (ref 6–20)
CHLORIDE: 105 mmol/L (ref 101–111)
CO2: 30 mmol/L (ref 22–32)
Calcium: 10 mg/dL (ref 8.9–10.3)
Creatinine, Ser: 0.85 mg/dL (ref 0.44–1.00)
GFR calc non Af Amer: >60 mL/min (ref >60)
Glucose, Bld: 92 mg/dL (ref 65–99)
POTASSIUM: 4.2 mmol/L (ref 3.5–5.1)
Sodium: 142 mmol/L (ref 135–145)

## 2017-10-12 LAB — MAGNESIUM: MAGNESIUM: 1.7 mg/dL (ref 1.7–2.4)

## 2017-10-12 MED ORDER — SODIUM CHLORIDE 0.9 % IV SOLN
1.0000 g | INTRAVENOUS | Status: DC
  Administered 2017-10-12 – 2017-10-14 (×3): 1 g via INTRAVENOUS
  Filled 2017-10-12 (×3): qty 1

## 2017-10-12 NOTE — Progress Notes (Signed)
[facility]/Tra[facility] Progress Note      Assessment/Plan Active Problems: Hyperlipemia Essential hypertension Fatty liver Pelvic abscess in female Immunosuppression due to drug therapy to Rheumatoid Arthritis to include prednisone daily 5mg  and Simponi Aria  Diverticulitis with abscess - GI and Urology involved and appreciate their assistance - -continue IV abx - WBC down today to 10.9 from 26  - repeat CT scan without evidence of fistula.  Small fluid collection stable.   FEN: advance to fulls today and low residue tomorrow AM if stable.   VTE: SCD;s ID: Zosyn 11/05>> Anticipate IV antibiotics at home.    Reviewed images and report of scan with patient   LOS: 5 days    Subjective: CC: abdominal pain  Continues to improve.  Tolerated clears with no pain.  No n/v.    Objective: Vital signs in last 24 hours: Temp:  [98 F (36.7 C)-98.9 F (37.2 C)] 98 F (36.7 C) (11/10 0509) Pulse Rate:  [70-74] 71 (11/10 0509) Resp:  [16-18] 16 (11/10 0509) BP: (112-136)/(58-106) 136/63 (11/10 0509) SpO2:  [94 %-100 %] 100 % (11/10 0509) Last BM Date: 10/11/17  Intake/Output from previous day: 11/09 0701 - 11/10 0700 In: 150 [IV Piggyback:150] Out: 2500 [Urine:2500] Intake/Output this shift: Total I/O In: 720 [P.O.:720] Out: -   PE: Gen: Alert, NAD, pleasant, cooperative Pulm:Rate andeffort normal Abd: Soft,not distended, minimal tenderness suprapubic region.   Skin: no rashes noted, warm and dry    Anti-infectives: Anti-infectives (From admission, onward)   Start     Dose/Rate Route Frequency Ordered Stop   10/09/17 1400  hydroxychloroquine (PLAQUENIL) tablet 400 mg     400 mg Oral Daily 10/09/17 1356     10/08/17 0300  piperacillin-tazobactam (ZOSYN) IVPB 3.375 g     3.375 g 12.5 mL/hr over 240 Minutes Intravenous Every 8 hours 10/07/17 1900     10/07/17 1900  piperacillin-tazobactam (ZOSYN) IVPB 3.375 g     3.375 g 100 mL/hr  over 30 Minutes Intravenous  Once 10/07/17 1853 10/07/17 1946      Lab Results:  Recent Labs    10/11/17 0437 10/12/17 0546  WBC 15.9* 10.9*  HGB 12.3 12.9  HCT 37.9 39.2  PLT 114* 118*   BMET Recent Labs    10/11/17 0437 10/12/17 0546  NA 139 142  K 3.4* 4.2  CL 102 105  CO2 28 30  GLUCOSE 96 92  BUN 8 6  CREATININE 0.81 0.85  CALCIUM 9.7 10.0   PT/INR No results for input(s): LABPROT, INR in the last 72 hours. CMP     Component Value Date/Time   NA 142 10/12/2017 0546   NA 139 06/28/2017 0851   K 4.2 10/12/2017 0546   K 3.8 06/28/2017 0851   CL 105 10/12/2017 0546   CO2 30 10/12/2017 0546   CO2 28 06/28/2017 0851   GLUCOSE 92 10/12/2017 0546   GLUCOSE 94 06/28/2017 0851   BUN 6 10/12/2017 0546   BUN 21.4 06/28/2017 0851   CREATININE 0.85 10/12/2017 0546   CREATININE 1.0 06/28/2017 0851   CALCIUM 10.0 10/12/2017 0546   CALCIUM 10.8 (H) 06/28/2017 0851   PROT 7.4 10/11/2017 0437   PROT 7.9 06/28/2017 0851   ALBUMIN 3.5 10/11/2017 0437   ALBUMIN 4.2 06/28/2017 0851   AST 25 10/11/2017 0437   AST 29 06/28/2017 0851   ALT 17 10/11/2017 0437   ALT 19 06/28/2017 0851   ALKPHOS 45 10/11/2017 0437   ALKPHOS 49 06/28/2017 0851  BILITOT 0.6 10/11/2017 0437   BILITOT 0.68 06/28/2017 0851   GFRNONAA >60 10/12/2017 0546   GFRNONAA 61 11/18/2013 0813   GFRAA >60 10/12/2017 0546   GFRAA 71 11/18/2013 0813   Lipase     Component Value Date/Time   LIPASE 28 10/07/2017 1646    Studies/Results: Ct Abdomen Pelvis W Contrast  Result Date: 10/11/2017 CLINICAL DATA:  Persistent diverticulitis with bladder abscess. Follow-up. EXAM: CT ABDOMEN AND PELVIS WITH CONTRAST TECHNIQUE: Multidetector CT imaging of the abdomen and pelvis was performed using the standard protocol following bolus administration of intravenous contrast. CONTRAST:  116mL ISOVUE-300 IOPAMIDOL (ISOVUE-300) INJECTION 61%, 41mL ISOVUE-300 IOPAMIDOL (ISOVUE-300) INJECTION 61% COMPARISON:  Pelvis CT  from 3 days ago. Abdominal CT from 4 days ago. FINDINGS: Lower chest:  Negative Hepatobiliary: Subcentimeter presumed cyst in the subcapsular left liver. Possible hepatic steatosis. No evidence of biliary obstruction or stone. Pancreas: Unremarkable. Spleen: Unremarkable. Adrenals/Urinary Tract: Negative adrenals. No hydronephrosis or stone. Tiny probable cysts in the right kidney. See below Stomach/Bowel: Sigmoid wall thickening, some combination of inflammation and muscular hypertrophy, stable. There is a 30 x 10 mm abscess between the sigmoid colon and the thick upper bladder wall. More anteriorly is a subcentimeter collection that previously contains gas. Oral contrast extended through the rectum and does not opacify the collections or the bladder. The larger collection is contiguous with the vaginal cuff that does not appear thickened. Vascular/Lymphatic: No acute vascular abnormality. No mass or adenopathy. Reproductive:Hysterectomy.  Negative adnexae. Other: No ascites or pneumoperitoneum. Musculoskeletal: No acute abnormalities. Disc and facet degeneration with mild scoliosis. No acute or aggressive finding. IMPRESSION: 3 x 1 cm abscess between the sigmoid, bladder, and vaginal cuff that is stable from admission CT 4 days ago. A subcentimeter more anterior collection along the bladder contains fluid instead of gas seen previously. There is cystitis along the dome without evidence of transmural fistula. Oral contrast reached the anus and does not opacify the abscess or bladder. Electronically Signed   By: Monte Fantasia M.D.   On: 10/11/2017 14:10      Stark Klein , [facility] 10/12/2017, 12:31 PM

## 2017-10-12 NOTE — Progress Notes (Signed)
Spoke with Rn re PICC line placement for home antibiotic therapy.  No D/c home orders, but possible on 10-14-17.  Has PIV access at this time.  Possible placement 10-13-17

## 2017-10-12 NOTE — Progress Notes (Signed)
Patient ID: Morgan [name], female   DOB: 1951-09-06, 66 y.o.   MRN: [id number]  PROGRESS NOTE    DAVIS VANNATTER  TGG:[id number] DOB: 04-19-1951 DOA: 10/07/2017 PCP: [name], MD   Brief Narrative:  66 year old female with history of RA and recent admission from 09/15/2017-09/18/2017 at an outside hospital for diverticulitis and was discharged home on 10 day course of Omnicef and Flagyl which she completed on 09/25/2017; presented with worsening lower abdominal pain. CT abdomen showed sigmoid wall thickening with tethering to the bladder wall and enhancing fluid collection terminating at the level of the vaginal cuff. She was started on IV antibiotics and GI, general surgery and urology were consulted.   Assessment & Plan:   Principal Problem:   Abscess of sigmoid colon due to diverticulitis Active Problems:   Hyperlipemia   Essential hypertension   Fatty liver   Pelvic abscess in female   Immunosuppression due to drug therapy to Rheumatoid Arthritis   Left lower quadrant abdominal abscess (HCC)   Recurrent diverticulitis with abscess:  - GI, general surgery and IR following. Apparently, IR is unable to drain the abscess - Urology consult appreciated: No need for any urological intervention at this moment. CT showed no obvious communication between bladder and sigmoid, gas in the bladder wall likely secondary to abscess but cannot totally exclude fistula - Continue zosyn. Pain is much improved with resolution of leukocytosis. Repeat CAT scan on 10/11/2017 short persistent abscess stable in size. Follow further recommendations from general surgery. Consulted ID as per general surgery recommendations. Patient might need PICC line placement and home IV antibiotics. - Diet advancement as per general surgery  - Pain management. - Repeat a.m. Labs  Leukocytosis  - Probably secondary to above. Resolved - Continue Zosyn for now  Rheumatoid arthritis: On golimumab, last infusion was  held; plaqueniland chronic prednisone 5mg .  - Continue plaquenil - Continue home oral prednisone dose  Essential HTN:  - Monitor blood pressure. Continue hydrochlorothiazide  Hyperlipidemia:  - Hold home medication for now  Hypokalemia: Resolved  Thrombocytopenia: Chronic, stable.  - Monitor   DVT prophylaxis: SCDs Code Status:  Full Family Communication: None at bedside Disposition Plan: Depends on clinical outcome  Consultants: GI, general surgery, urology, ID  Procedures: None  Antimicrobials: Zosyn from 10/07/2017 onwards    Subjective: Patient seen and examined at bedside. She states that her abdominal pain is better. She denies any overnight fever, nausea or vomiting.  Objective: Vitals:   10/11/17 0531 10/11/17 1352 10/11/17 2031 10/12/17 0509  BP: 128/72 (!) 132/106 (!) 112/58 136/63  Pulse: 67 74 70 71  Resp: 18 18 18 16   Temp: 98.3 F (36.8 C) 98.1 F (36.7 C) 98.9 F (37.2 C) 98 F (36.7 C)  TempSrc: Oral Oral Oral Oral  SpO2: 98% 94% 98% 100%  Weight:      Height:        Intake/Output Summary (Last 24 hours) at 10/12/2017 1050 Last data filed at 10/11/2017 1753 Gross per 24 hour  Intake 50 ml  Output 1500 ml  Net -1450 ml   Filed Weights   10/07/17 1540  Weight: 86.2 kg (190 lb)    Examination:  General exam: Appears calm and comfortable  Respiratory system: Bilateral decreased breath sound at bases Cardiovascular system: S1 & S2 heard, rate controlled  Gastrointestinal system: Abdomen is nondistended, soft and mildly tender today in the left lower quadrant with no rebound tenderness. Normal bowel sounds heard. Extremities: No cyanosis, clubbing, edema  Data Reviewed: I have personally reviewed following labs and imaging studies  CBC: Recent Labs  Lab 10/07/17 1646 10/08/17 0602 10/10/17 0447 10/11/17 0437 10/12/17 0546  WBC 16.7* 13.1* 26.0* 15.9* 10.9*  NEUTROABS 11.4*  --  18.8* 9.3* 5.7  HGB 12.3 11.3* 12.1 12.3  12.9  HCT 38.3 34.9* 36.2 37.9 39.2  MCV 91.4 91.8 90.3 90.9 91.6  PLT 114* 99* 104* 114* 865*   Basic Metabolic Panel: Recent Labs  Lab 10/07/17 1646 10/08/17 0602 10/10/17 0447 10/11/17 0437 10/12/17 0546  NA 136 141 142 139 142  K 3.5 3.7 3.6 3.4* 4.2  CL 100* 105 105 102 105  CO2 29 26 30 28 30   GLUCOSE 99 88 98 96 92  BUN 15 10 8 8 6   CREATININE 0.87 0.72 0.66 0.81 0.85  CALCIUM 9.7 8.9 9.5 9.7 10.0  MG  --  1.8 1.8 1.8 1.7  PHOS  --  3.4  --   --   --    GFR: Estimated Creatinine Clearance: 66.9 mL/min (by C-G formula based on SCr of 0.85 mg/dL). Liver Function Tests: Recent Labs  Lab 10/07/17 1646 10/08/17 0602 10/11/17 0437  AST 23 20 25   ALT 17 16 17   ALKPHOS 49 42 45  BILITOT 0.3 0.5 0.6  PROT 7.5 6.6 7.4  ALBUMIN 3.7 3.4* 3.5   Recent Labs  Lab 10/07/17 1646  LIPASE 28   No results for input(s): AMMONIA in the last 168 hours. Coagulation Profile: No results for input(s): INR, PROTIME in the last 168 hours. Cardiac Enzymes: No results for input(s): CKTOTAL, CKMB, CKMBINDEX, TROPONINI in the last 168 hours. BNP (last 3 results) No results for input(s): PROBNP in the last 8760 hours. HbA1C: No results for input(s): HGBA1C in the last 72 hours. CBG: No results for input(s): GLUCAP in the last 168 hours. Lipid Profile: No results for input(s): CHOL, HDL, LDLCALC, TRIG, CHOLHDL, LDLDIRECT in the last 72 hours. Thyroid Function Tests: No results for input(s): TSH, T4TOTAL, FREET4, T3FREE, THYROIDAB in the last 72 hours. Anemia Panel: No results for input(s): VITAMINB12, FOLATE, FERRITIN, TIBC, IRON, RETICCTPCT in the last 72 hours. Sepsis Labs: No results for input(s): PROCALCITON, LATICACIDVEN in the last 168 hours.  Recent Results (from the past 240 hour(s))  Urine culture     Status: Abnormal   Collection Time: 10/07/17  3:58 PM  Result Value Ref Range Status   Specimen Description URINE, CLEAN CATCH  Final   Special Requests NONE  Final    Culture (A)  Final    <10,000 COLONIES/mL INSIGNIFICANT GROWTH Performed at [facility], [address], [place], [place] [address]    Report Status 10/09/2017 FINAL  Final         Radiology Studies: Ct Abdomen Pelvis W Contrast  Result Date: 10/11/2017 CLINICAL DATA:  Persistent diverticulitis with bladder abscess. Follow-up. EXAM: CT ABDOMEN AND PELVIS WITH CONTRAST TECHNIQUE: Multidetector CT imaging of the abdomen and pelvis was performed using the standard protocol following bolus administration of intravenous contrast. CONTRAST:  11mL ISOVUE-300 IOPAMIDOL (ISOVUE-300) INJECTION 61%, 5mL ISOVUE-300 IOPAMIDOL (ISOVUE-300) INJECTION 61% COMPARISON:  Pelvis CT from 3 days ago. Abdominal CT from 4 days ago. FINDINGS: Lower chest:  Negative Hepatobiliary: Subcentimeter presumed cyst in the subcapsular left liver. Possible hepatic steatosis. No evidence of biliary obstruction or stone. Pancreas: Unremarkable. Spleen: Unremarkable. Adrenals/Urinary Tract: Negative adrenals. No hydronephrosis or stone. Tiny probable cysts in the right kidney. See below Stomach/Bowel: Sigmoid wall thickening, some combination  of inflammation and muscular hypertrophy, stable. There is a 30 x 10 mm abscess between the sigmoid colon and the thick upper bladder wall. More anteriorly is a subcentimeter collection that previously contains gas. Oral contrast extended through the rectum and does not opacify the collections or the bladder. The larger collection is contiguous with the vaginal cuff that does not appear thickened. Vascular/Lymphatic: No acute vascular abnormality. No mass or adenopathy. Reproductive:Hysterectomy.  Negative adnexae. Other: No ascites or pneumoperitoneum. Musculoskeletal: No acute abnormalities. Disc and facet degeneration with mild scoliosis. No acute or aggressive finding. IMPRESSION: 3 x 1 cm abscess between the sigmoid, bladder, and vaginal cuff that is stable from admission CT 4 days ago.  A subcentimeter more anterior collection along the bladder contains fluid instead of gas seen previously. There is cystitis along the dome without evidence of transmural fistula. Oral contrast reached the anus and does not opacify the abscess or bladder. Electronically Signed   By: Monte Fantasia M.D.   On: 10/11/2017 14:10        Scheduled Meds: . hydrochlorothiazide  25 mg Oral Daily  . hydroxychloroquine  400 mg Oral Daily  . predniSONE  5 mg Oral Daily  . sodium chloride flush  3 mL Intravenous Q12H   Continuous Infusions: . piperacillin-tazobactam (ZOSYN)  IV Stopped (10/12/17 8891)     LOS: 5 days        [name], MD [facility] Pager [phone]  If 7PM-7AM, please contact night-coverage [email or web address] Password [email or web address] 10/12/2017, 10:50 AM

## 2017-10-12 NOTE — Consult Note (Signed)
[facility]    Date of Admission:  10/07/2017           Day 5 piperacillin tazobactam       Reason for Consult: Diverticular abscess    Referring Provider: Dr. Starla Link Kshitiz  Assessment: She has a persistent sigmoid diverticular abscess. She improved on empiric therapy then started feeling worse about one week after completing antibiotics. I suspect her immunosuppressive therapy is the major reason she is having trouble clearing her abscess. The abscess is too small and in a location such that percutaneous drainage is not possible. I talked to her about various options for antibiotic therapy including continued IV antibiotic therapy as an outpatient through a PICC or another round of oral antibiotics. She prefers to try IV antibiotics. I will plan on at least 2 more weeks of therapy before getting a follow-up CT scan.   Plan: 1. Change piperacillin tazobactam to once daily ertapenem 2. PICC placement  Outpatient IV antibiotic orders Diagnosis: Sigmoid diverticular abscess  Culture Result: None available  Allergies  Allergen Reactions  . Remicade [Infliximab] Swelling and Other (See Comments)    Facial swelling, throat was closing up.     OPAT Orders Discharge antibiotics: Per pharmacy protocol ertapenem  Duration: 2 weeks End Date: 10/28/2017  [facility] Per Protocol:  Labs weekly while on IV antibiotics: _x_ CBC with differential _x_ BMP __ CMP __ CRP __ ESR __ Vancomycin trough  __ Please pull PIC at completion of IV antibiotics _x_ Please leave PIC in place until doctor has seen patient or been notified  Fax weekly labs to [phone]  Clinic Follow Up Appt: 10/28/2017  Principal Problem:   Abscess of sigmoid colon due to diverticulitis Active Problems:   Immunosuppression due to drug therapy to Rheumatoid Arthritis   Hyperlipemia   Essential hypertension   GERD   Fatty liver   Thrombocytopenia  (HCC)   Scheduled Meds: . hydrochlorothiazide  25 mg Oral Daily  . hydroxychloroquine  400 mg Oral Daily  . predniSONE  5 mg Oral Daily  . sodium chloride flush  3 mL Intravenous Q12H   Continuous Infusions: . ertapenem     PRN Meds:.morphine injection, ondansetron **OR** ondansetron (ZOFRAN) IV  HPI: Morgan [name] is a 66 y.o. female with rheumatoid arthritis and a history of colonic diverticulosis. She had one bout of diverticulitis many years ago. About one month ago she developed left lower quadrant pain and some urinary urgency. She was started on antibiotics for possible urinary tract infection on 09/13/2017 but had persistent pain and was admitted to [facility] on 09/15/2017 and found to have sigmoid diverticulitis complicated by a small abscess adherent to the bladder. There was no evidence of colovesical fistula. She was treated with IV antibiotics and then transition to oral cefdinir and metronidazole. She received a total of 10 days of antibiotic therapy completing treatment on 09/25/2017. She saw Dr. Scarlette Shorts, her gastroenterologist on 09/30/2017 and was scheduled for a colonoscopy in December. Shortly after that she started having increasing abdominal pain and was readmitted on 10/07/2017. Repeat CT scan showed a 1 x 3 cm abscess between the small bowel and bladder. Again, no fistula was noted. She was started on piperacillin tazobactam. A repeat scan yesterday was unchanged. She is feeling better with less pain.  She has been on long-term prednisone for her rheumatoid arthritis. She also gets infusions of golimumab (Simponi) every 8 weeks. She  was started on Plaquenil several months ago. Her rheumatoid arthritis has been under good control recently. Because of her recent diverticulitis she skipped her last infusion of golimumab in late October.   Review of Systems: Review of Systems  Constitutional: Positive for malaise/fatigue. Negative for chills, diaphoresis, fever and  weight loss.  HENT: Negative for sore throat.   Respiratory: Negative for cough, sputum production and shortness of breath.   Cardiovascular: Negative for chest pain.  Gastrointestinal: Positive for abdominal pain. Negative for diarrhea, heartburn, nausea and vomiting.       Recent soft stools but no diarrhea.  Genitourinary: Positive for urgency. Negative for dysuria and frequency.  Musculoskeletal: Positive for joint pain. Negative for myalgias.  Skin: Negative for rash.  Neurological: Negative for dizziness and headaches.  Psychiatric/Behavioral: Negative for depression.    Past Medical History:  Diagnosis Date  . Abnormal chest x-ray   . Abnormal electrocardiogram   . Allergic rhinitis   . Colon polyp    polypoid colorectal mucosa/adenomatous  . Contact dermatitis due to poison ivy   . Cystitis   . Diverticulosis of colon   . Fatty liver disease, nonalcoholic   . Gallbladder polyp   . GERD (gastroesophageal reflux disease)   . Hearing loss   . Hemorrhoids   . Hyperlipidemia   . Lymphocytosis   . Overweight(278.02)   . RA (rheumatoid arthritis) ([place])   . Venous insufficiency     Social History   Tobacco Use  . Smoking status: Former Smoker    Packs/day: 0.50    Years: 15.00    Pack years: 7.50    Types: Cigarettes    Last attempt to quit: 12/03/2001    Years since quitting: 15.8  . Smokeless tobacco: Never Used  Substance Use Topics  . Alcohol use: Yes    Comment: occasional  . Drug use: No    Family History  Problem Relation Age of Onset  . Hyperlipidemia Mother   . Hypertension Mother   . Heart disease Sister        sister #1  . Breast cancer Other        half-sister  . Liver disease Sister        sister #1 - hx of liver transplant 14+ yrs ago  . Healthy Sister        sister #2  . Diabetes Maternal Uncle   . Heart failure Brother   . Diabetes Maternal Aunt        x 2  . Heart failure Son   . Colon cancer Neg Hx    Allergies  Allergen  Reactions  . Remicade [Infliximab] Swelling and Other (See Comments)    Facial swelling, throat was closing up.     OBJECTIVE: Blood pressure (!) 105/47, pulse 86, temperature 98 F (36.7 C), resp. rate 16, height 5' 3.75" (1.619 m), weight 190 lb (86.2 kg), SpO2 99 %.  Physical Exam  Constitutional: She is oriented to person, place, and time.  She is in good spirits and in no distress. Her husband is at the bedside.  HENT:  Mouth/Throat: No oropharyngeal exudate.  Eyes: Conjunctivae are normal.  Cardiovascular: Normal rate and regular rhythm.  No murmur heard. Pulmonary/Chest: Effort normal. She has wheezes. She has no rales.  Abdominal: Soft. Bowel sounds are normal. She exhibits no distension and no mass. There is tenderness.  She has moderate left lower quadrant tenderness with palpation.  Musculoskeletal: Normal range of motion. She exhibits no edema or  tenderness.  Neurological: She is alert and oriented to person, place, and time.  Skin: No rash noted.  Psychiatric: Mood and affect normal.    Lab Results Lab Results  Component Value Date   WBC 10.9 (H) 10/12/2017   HGB 12.9 10/12/2017   HCT 39.2 10/12/2017   MCV 91.6 10/12/2017   PLT 118 (L) 10/12/2017    Lab Results  Component Value Date   CREATININE 0.85 10/12/2017   BUN 6 10/12/2017   NA 142 10/12/2017   K 4.2 10/12/2017   CL 105 10/12/2017   CO2 30 10/12/2017    Lab Results  Component Value Date   ALT 17 10/11/2017   AST 25 10/11/2017   ALKPHOS 45 10/11/2017   BILITOT 0.6 10/11/2017     Microbiology: Recent Results (from the past 240 hour(s))  Urine culture     Status: Abnormal   Collection Time: 10/07/17  3:58 PM  Result Value Ref Range Status   Specimen Description URINE, CLEAN CATCH  Final   Special Requests NONE  Final   Culture (A)  Final    <10,000 COLONIES/mL INSIGNIFICANT GROWTH Performed at [facility], [place] [address], [place], [place] [address]    Report Status 10/09/2017  FINAL  Final    Michel Bickers, MD [facility] [phone] pager   [phone] cell 10/12/2017, 2:42 PM

## 2017-10-13 ENCOUNTER — Inpatient Hospital Stay (HOSPITAL_COMMUNITY): Payer: BLUE CROSS/BLUE SHIELD

## 2017-10-13 DIAGNOSIS — D696 Thrombocytopenia, unspecified: Secondary | ICD-10-CM

## 2017-10-13 LAB — CBC WITH DIFFERENTIAL/PLATELET
BASOS ABS: 0 10*3/uL (ref 0.0–0.1)
Basophils Relative: 0 %
EOS ABS: 0.1 10*3/uL (ref 0.0–0.7)
EOS PCT: 1 %
HCT: 37.2 % (ref 36.0–46.0)
Hemoglobin: 12.3 g/dL (ref 12.0–15.0)
LYMPHS ABS: 2.5 10*3/uL (ref 0.7–4.0)
LYMPHS PCT: 20 %
MCH: 30 pg (ref 26.0–34.0)
MCHC: 33.1 g/dL (ref 30.0–36.0)
MCV: 90.7 fL (ref 78.0–100.0)
MONO ABS: 4 10*3/uL — AB (ref 0.1–1.0)
Monocytes Relative: 33 %
NEUTROS ABS: 5.6 10*3/uL (ref 1.7–7.7)
NEUTROS PCT: 46 %
PLATELETS: 109 10*3/uL — AB (ref 150–400)
RBC: 4.1 MIL/uL (ref 3.87–5.11)
RDW: 15.3 % (ref 11.5–15.5)
WBC: 12.1 10*3/uL — ABNORMAL HIGH (ref 4.0–10.5)

## 2017-10-13 LAB — BASIC METABOLIC PANEL
Anion gap: 10 (ref 5–15)
BUN: 10 mg/dL (ref 6–20)
CALCIUM: 9.7 mg/dL (ref 8.9–10.3)
CO2: 27 mmol/L (ref 22–32)
Chloride: 105 mmol/L (ref 101–111)
Creatinine, Ser: 0.7 mg/dL (ref 0.44–1.00)
GFR calc Af Amer: >60 mL/min (ref >60)
GLUCOSE: 105 mg/dL — AB (ref 65–99)
Potassium: 3.5 mmol/L (ref 3.5–5.1)
SODIUM: 142 mmol/L (ref 135–145)

## 2017-10-13 LAB — MAGNESIUM: MAGNESIUM: 1.8 mg/dL (ref 1.7–2.4)

## 2017-10-13 MED ORDER — SODIUM CHLORIDE 0.9% FLUSH: 10.0000 mL | INTRAVENOUS | Status: DC | PRN

## 2017-10-13 MED ORDER — ADULT MULTIVITAMIN W/MINERALS CH
1.0000 | ORAL_TABLET | Freq: Every day | ORAL | Status: DC
  Administered 2017-10-13 – 2017-10-14 (×2): 1 via ORAL
  Filled 2017-10-13 (×2): qty 1

## 2017-10-13 MED ORDER — SIMVASTATIN 40 MG PO TABS
40.0000 mg | ORAL_TABLET | Freq: Every day | ORAL | Status: DC
  Administered 2017-10-13: 40 mg via ORAL
  Filled 2017-10-13: qty 1

## 2017-10-13 NOTE — Progress Notes (Signed)
Patient ID: Morgan [name], female   DOB: 02/01/1951, 66 y.o.   MRN: [id number]  PROGRESS NOTE    LORENZO PEREYRA  ZGY:[id number] DOB: 1951/04/24 DOA: 10/07/2017 PCP: [name], MD   Brief Narrative:  66 year old female with history of RA and recent admission from 09/15/2017-09/18/2017 at an outside hospital for diverticulitis and was discharged home on 10 day course of Omnicef and Flagyl which she completed on 09/25/2017; presented with worsening lower abdominal pain. CT abdomen showed sigmoid wall thickening with tethering to the bladder wall and enhancing fluid collection terminating at the level of the vaginal cuff. She was started on IV antibiotics and GI, general surgery and urology were consulted.   Assessment & Plan:   Principal Problem:   Abscess of sigmoid colon due to diverticulitis Active Problems:   Hyperlipemia   Essential hypertension   GERD   Fatty liver   Thrombocytopenia (HCC)   Immunosuppression due to drug therapy to Rheumatoid Arthritis   Recurrent diverticulitis with abscess:  - GI, general surgery and IR following. Apparently, IR is unable to drain the abscess - Urology consult appreciated: No need for any urological intervention at this moment. CT showed no obvious communication between bladder and sigmoid, gas in the bladder wall likely secondary to abscess but cannot totally exclude fistula - Pain is much improved. Repeat CAT scan on 10/11/2017 short persistent abscess stable in size. Follow further recommendations from general surgery.  - Antibiotics changed to ertapenem per ID recommendations. PICC line placement. Probable discharge tomorrow on IV antibiotics as per ID recommendations - Diet advancement as per general surgery  - Pain management. - Repeat a.m. Labs  Leukocytosis  - Probably secondary to above.  - Repeat a.m. labs. Continue ertapenem  Rheumatoid arthritis: On golimumab, last infusion was held; plaqueniland chronic prednisone 5mg .    - Continue plaquenil - Continue home oral prednisone dose - Outpatient follow-up with rheumatologist  Essential HTN:  - Monitor blood pressure. Continue hydrochlorothiazide  Hyperlipidemia:  - Resume home medication on discharge  Hypokalemia: Resolved  Thrombocytopenia: Chronic, stable.  - Monitor   DVT prophylaxis: SCDs Code Status:  Full Family Communication: None at bedside Disposition Plan: Probable discharge home tomorrow  Consultants: GI, general surgery, urology, ID  Procedures: None  Antimicrobials: Zosyn from 10/07/2017 onwards    Subjective: Patient seen and examined at bedside. She complains of very minimal lower abdominal pain. She denies any overnight fever, nausea or vomiting.  Objective: Vitals:   10/12/17 0509 10/12/17 1400 10/12/17 2140 10/13/17 0555  BP: 136/63 (!) 105/47 135/67 117/63  Pulse: 71 86 76 72  Resp: 16 16 18 16   Temp: 98 F (36.7 C) 98 F (36.7 C) 97.9 F (36.6 C) 98.1 F (36.7 C)  TempSrc: Oral  Oral Oral  SpO2: 100% 99% 99% 95%  Weight:      Height:        Intake/Output Summary (Last 24 hours) at 10/13/2017 0957 Last data filed at 10/13/2017 0557 Gross per 24 hour  Intake 1165 ml  Output 2050 ml  Net -885 ml   Filed Weights   10/07/17 1540  Weight: 86.2 kg (190 lb)    Examination:  General exam: Appears calm and comfortable  Respiratory system: Bilateral decreased breath sound at bases Cardiovascular system: S1 & S2 heard, rate controlled  Gastrointestinal system: Abdomen is nondistended, soft and mildly tender today in the left lower quadrant. Normal bowel sounds heard. Extremities: No cyanosis, clubbing, edema    Data Reviewed: I  have personally reviewed following labs and imaging studies  CBC: Recent Labs  Lab 10/07/17 1646 10/08/17 0602 10/10/17 0447 10/11/17 0437 10/12/17 0546 10/13/17 0435  WBC 16.7* 13.1* 26.0* 15.9* 10.9* 12.1*  NEUTROABS 11.4*  --  18.8* 9.3* 5.7 5.6  HGB 12.3 11.3* 12.1  12.3 12.9 12.3  HCT 38.3 34.9* 36.2 37.9 39.2 37.2  MCV 91.4 91.8 90.3 90.9 91.6 90.7  PLT 114* 99* 104* 114* 118* 132*   Basic Metabolic Panel: Recent Labs  Lab 10/08/17 0602 10/10/17 0447 10/11/17 0437 10/12/17 0546 10/13/17 0435  NA 141 142 139 142 142  K 3.7 3.6 3.4* 4.2 3.5  CL 105 105 102 105 105  CO2 26 30 28 30 27   GLUCOSE 88 98 96 92 105*  BUN 10 8 8 6 10   CREATININE 0.72 0.66 0.81 0.85 0.70  CALCIUM 8.9 9.5 9.7 10.0 9.7  MG 1.8 1.8 1.8 1.7 1.8  PHOS 3.4  --   --   --   --    GFR: Estimated Creatinine Clearance: 71.1 mL/min (by C-G formula based on SCr of 0.7 mg/dL). Liver Function Tests: Recent Labs  Lab 10/07/17 1646 10/08/17 0602 10/11/17 0437  AST 23 20 25   ALT 17 16 17   ALKPHOS 49 42 45  BILITOT 0.3 0.5 0.6  PROT 7.5 6.6 7.4  ALBUMIN 3.7 3.4* 3.5   Recent Labs  Lab 10/07/17 1646  LIPASE 28   No results for input(s): AMMONIA in the last 168 hours. Coagulation Profile: No results for input(s): INR, PROTIME in the last 168 hours. Cardiac Enzymes: No results for input(s): CKTOTAL, CKMB, CKMBINDEX, TROPONINI in the last 168 hours. BNP (last 3 results) No results for input(s): PROBNP in the last 8760 hours. HbA1C: No results for input(s): HGBA1C in the last 72 hours. CBG: No results for input(s): GLUCAP in the last 168 hours. Lipid Profile: No results for input(s): CHOL, HDL, LDLCALC, TRIG, CHOLHDL, LDLDIRECT in the last 72 hours. Thyroid Function Tests: No results for input(s): TSH, T4TOTAL, FREET4, T3FREE, THYROIDAB in the last 72 hours. Anemia Panel: No results for input(s): VITAMINB12, FOLATE, FERRITIN, TIBC, IRON, RETICCTPCT in the last 72 hours. Sepsis Labs: No results for input(s): PROCALCITON, LATICACIDVEN in the last 168 hours.  Recent Results (from the past 240 hour(s))  Urine culture     Status: Abnormal   Collection Time: 10/07/17  3:58 PM  Result Value Ref Range Status   Specimen Description URINE, CLEAN CATCH  Final   Special  Requests NONE  Final   Culture (A)  Final    <10,000 COLONIES/mL INSIGNIFICANT GROWTH Performed at [facility], [address], [place],  [address]    Report Status 10/09/2017 FINAL  Final         Radiology Studies: Ct Abdomen Pelvis W Contrast  Result Date: 10/11/2017 CLINICAL DATA:  Persistent diverticulitis with bladder abscess. Follow-up. EXAM: CT ABDOMEN AND PELVIS WITH CONTRAST TECHNIQUE: Multidetector CT imaging of the abdomen and pelvis was performed using the standard protocol following bolus administration of intravenous contrast. CONTRAST:  153mL ISOVUE-300 IOPAMIDOL (ISOVUE-300) INJECTION 61%, 75mL ISOVUE-300 IOPAMIDOL (ISOVUE-300) INJECTION 61% COMPARISON:  Pelvis CT from 3 days ago. Abdominal CT from 4 days ago. FINDINGS: Lower chest:  Negative Hepatobiliary: Subcentimeter presumed cyst in the subcapsular left liver. Possible hepatic steatosis. No evidence of biliary obstruction or stone. Pancreas: Unremarkable. Spleen: Unremarkable. Adrenals/Urinary Tract: Negative adrenals. No hydronephrosis or stone. Tiny probable cysts in the right kidney. See below Stomach/Bowel: Sigmoid wall  thickening, some combination of inflammation and muscular hypertrophy, stable. There is a 30 x 10 mm abscess between the sigmoid colon and the thick upper bladder wall. More anteriorly is a subcentimeter collection that previously contains gas. Oral contrast extended through the rectum and does not opacify the collections or the bladder. The larger collection is contiguous with the vaginal cuff that does not appear thickened. Vascular/Lymphatic: No acute vascular abnormality. No mass or adenopathy. Reproductive:Hysterectomy.  Negative adnexae. Other: No ascites or pneumoperitoneum. Musculoskeletal: No acute abnormalities. Disc and facet degeneration with mild scoliosis. No acute or aggressive finding. IMPRESSION: 3 x 1 cm abscess between the sigmoid, bladder, and vaginal cuff that is stable from  admission CT 4 days ago. A subcentimeter more anterior collection along the bladder contains fluid instead of gas seen previously. There is cystitis along the dome without evidence of transmural fistula. Oral contrast reached the anus and does not opacify the abscess or bladder. Electronically Signed   By: Monte Fantasia M.D.   On: 10/11/2017 14:10        Scheduled Meds: . hydrochlorothiazide  25 mg Oral Daily  . hydroxychloroquine  400 mg Oral Daily  . predniSONE  5 mg Oral Daily  . sodium chloride flush  3 mL Intravenous Q12H   Continuous Infusions: . ertapenem Stopped (10/12/17 1829)     LOS: 6 days        [name], MD [facility] Pager [phone]  If 7PM-7AM, please contact night-coverage [email or web address] Password [email or web address] 10/13/2017, 9:57 AM

## 2017-10-13 NOTE — Discharge Instructions (Signed)
Diverticulitis Diverticulitis is inflammation or infection of small pouches in your colon that form when you have a condition called diverticulosis. The pouches in your colon are called diverticula. Your colon, or large intestine, is where water is absorbed and stool is formed. Complications of diverticulitis can include:  Bleeding.  Severe infection.  Severe pain.  Perforation of your colon.  Obstruction of your colon.  What are the causes? Diverticulitis is caused by bacteria. Diverticulitis happens when stool becomes trapped in diverticula. This allows bacteria to grow in the diverticula, which can lead to inflammation and infection. What increases the risk? People with diverticulosis are at risk for diverticulitis. Eating a diet that does not include enough fiber from fruits and vegetables may make diverticulitis more likely to develop. What are the signs or symptoms? Symptoms of diverticulitis may include:  Abdominal pain and tenderness. The pain is normally located on the left side of the abdomen, but may occur in other areas.  Fever and chills.  Bloating.  Cramping.  Nausea.  Vomiting.  Constipation.  Diarrhea.  Blood in your stool.  How is this diagnosed? Your health care provider will ask you about your medical history and do a physical exam. You may need to have tests done because many medical conditions can cause the same symptoms as diverticulitis. Tests may include:  Blood tests.  Urine tests.  Imaging tests of the abdomen, including X-rays and CT scans.  When your condition is under control, your health care provider may recommend that you have a colonoscopy. A colonoscopy can show how severe your diverticula are and whether something else is causing your symptoms. How is this treated? Most cases of diverticulitis are mild and can be treated at home. Treatment may include:  Taking over-the-counter pain medicines.  Following a clear liquid  diet.  Taking antibiotic medicines by mouth for 7-10 days.  More severe cases may be treated at a hospital. Treatment may include:  Not eating or drinking.  Taking prescription pain medicine.  Receiving antibiotic medicines through an IV tube.  Receiving fluids and nutrition through an IV tube.  Surgery.  Follow these instructions at home:  Follow your health care providers instructions carefully.  Follow a full liquid diet or other diet as directed by your health care provider. After your symptoms improve, your health care provider may tell you to change your diet. He or she may recommend you eat a high-fiber diet. Fruits and vegetables are good sources of fiber. Fiber makes it easier to pass stool.  Take fiber supplements or probiotics as directed by your health care provider.  Only take medicines as directed by your health care provider.  Keep all your follow-up appointments. Contact a health care provider if:  Your pain does not improve.  You have a hard time eating food.  Your bowel movements do not return to normal. Get help right away if:  Your pain becomes worse.  Your symptoms do not get better.  Your symptoms suddenly get worse.  You have a fever.  You have repeated vomiting.  You have bloody or black, tarry stools. This information is not intended to replace advice given to you by your health care provider. Make sure you discuss any questions you have with your health care provider. Document Released: 08/29/2005 Document Revised: 04/26/2016 Document Reviewed: 10/14/2013 Elsevier Interactive Patient Education  2017 [employer], Adult A central line is a soft, flexible tube (catheter) that can be used to collect blood  for testing or to give medicine or nutrition through a vein. The tip of the central line ends in a large vein just above the heart called the vena cava. A central line may be placed because:  You need to get medicines  or fluids through an IV tube for a long period of time.  You need nutrition but cannot eat or absorb nutrients.  The veins in your hands or arms are hard to access.  You need to have blood taken often for blood tests.  You need a blood transfusion  You need chemotherapy or dialysis.  There are many types of central lines:  Peripherally inserted central catheter (PICC) line. This type is used for intermediate access to long-term access of one week or more. It can be used to draw blood and give fluids or medicines. A PICC looks like an IV tube, but it goes up the arm to the heart. It is usually inserted in the upper arm and taped in place on the arm.  Tunneled central line. This type is used for long-term therapy and dialysis. It is placed in a large vein in the neck, chest, or groin. A tunneled central line is inserted through a small incision made over the vein and is advanced into the heart. It is tunneled beneath the skin and brought out through a second incision.  Non-tunneled central line. This type is used for short-term access, usually of a maximum of 7 days. It is often used in the emergency department. A non-tunneled central line is inserted in the neck, chest, or groin.  Implanted port. This type is used for long-term therapy. It can stay in place longer than other types of central lines. An implanted port is normally inserted in the upper chest but can also be placed in the upper arm or in the abdomen. It is inserted and removed with surgery, and it is accessed using a special needle.  The type of central line that you receive depends on how long you will need it, your medical condition, and the condition of your veins. What are the risks? Using any type of central line has risks that you should be aware of, including:  Infection.  A blood clot that blocks the central line or forms in the vein and travels to the heart.  Bleeding from the place where the central line was put  in.  Developing a hole or crack within the central line. If this happens, the central line will need to be replaced.  Developing an abnormal heart rhythm (arrhythmia). This is rare.  Central line failure.  Follow these instructions at home: Flushing and cleaning the central line  Follow instructions from the health care provider about flushing and cleaning the central line.  Wear a mask when flushing or cleaning the central line.  Before you flush or clean the central line: ? Wash your hands with soap and water. ? Clean the central line hub with rubbing alcohol. Insertion site care  Keep the insertion site of your central line clean and dry at all times.  Check your incision or central line site every day for signs of infection. Check for: ? More redness, swelling, or pain. ? More fluid or blood. ? Warmth. ? Pus or a bad smell. General instructions  Follow instructions from your health care provider for the type of device that you have.  If the central line accidentally gets pulled on, make sure: ? The bandage (dressing) is okay. ? There is  no bleeding. ? The line has not been pulled out.  Return to your normal activities as told by your health care provider. Ask your health care provider what activities are safe for you. You may be restricted from lifting or making repetitive arm movements on the side with the catheter.  Do not swim or bathe unless your health care provider approves.  Keep your dressing dry. Your health care provider can instruct you about how to keep your specific type of dressing from getting wet.  Keep all follow-up visits as told by your health care provider. This is important. Contact a health care provider if:  You have more redness, swelling, or pain around your incision.  You have more fluid or blood coming from your incision.  Your incision feels warm to the touch.  You have pus or a bad smell coming from your incision. Get help right  away if:  You have: ? Chills. ? A fever. ? Shortness of breath. ? Trouble breathing. ? Chest pain. ? Swelling in your neck, face, chest, or arm on the side of your central line.  You are coughing.  You feel your heart beating rapidly or skipping beats.  You feel dizzy or you faint.  Your incision or central line site has red streaks spreading away from the area.  Your incision or central line site is bleeding and does not stop.  Your central line is difficult to flush or will not flush.  You do not get a blood return from the central line.  Your central line gets loose or comes out.  Your central line gets damaged.  Your catheter leaks when flushed or when fluids are infused into it. This information is not intended to replace advice given to you by your health care provider. Make sure you discuss any questions you have with your health care provider. Document Released: 01/10/2006 Document Revised: 07/18/2016 Document Reviewed: 06/27/2016 Elsevier Interactive Patient Education  2017 [employer].   Immunosuppression Immunosuppression uses medicines to lower your immune response. Your immune response is your body's natural way of defending itself against something new or unknown that enters your body, such as bacteria or a virus. Why am I receiving immunosuppression? You may be receiving immunosuppression:  To treat an autoimmune disorder. Autoimmune disorders cause your immune response to attack your body.  To keep your body from rejecting a transplant of cells, tissues, or organs that you received from a donor. When you receive a transplant, your body knows that there is something new and unknown in your body. Sometimes this triggers the immune response to attack the transplant.  To prevent inflammation that is caused by a long-term (chronic) condition, such as asthma or chronic obstructive pulmonary disease (COPD). Certain types of chronic conditions are related to  severe immune responses. These responses can cause inflammation that can lead to life-threatening situations.  What are the side effects of immunosuppression? The main side effect of immunosuppression is a higher risk of infection. Because immunosuppression lowers your body's ability to defend itself, you should call your health care provider if you have any symptoms of infection, such as:  A fever.  Fluid or a bad smell coming from a surgical scar.  A burning feeling when you pass urine.  A cold or cough that will not go away.  Body aches.  Other side effects of immunosuppression include:  Nausea and vomiting.  Loss of appetite.  Increased hair growth.  Shaky hands (hand tremor).  These side effects  typically go away as your body gets used to the medicine. They may also go away or get better if your health care provider changes your dosage. Why is it important for me to take my medicine exactly as instructed? For immunosuppression to work, your body needs to have just the right amount of medicine all the time. Your health care provider will tell you exactly how much medicine to take and exactly when you need to take it. It is very important to follow instructions from your health care provider. Missing even a single dose can cause your condition to get worse. If you forget to take your medicine, take it as soon as you remember and call your health care provider right away. But, if you forget to take your medicine and it is time to take it again, do not take two doses. What are some tips for remembering to take my medicine? Here are some tips to remind you to take your medicine:  Organize your daily medicines using one of these tools: ? Pill organizer. ? Written chart from your health care provider. ? Notebook. ? Binder. ? Your own calendar.  Use your tool to help you keep track of the: ? Name of the medicine and the dosage. ? Days to take the medicine(s). ? Time of day to  take the medicine(s).  Set cues or reminders for taking your medicine(s), such as using a clock or cell phone alarm.  Review your medicine schedule with family members or friends. They can help remind you when to take your medicine(s) and how much to take.  This information is not intended to replace advice given to you by your health care provider. Make sure you discuss any questions you have with your health care provider. Document Released: 11/24/2013 Document Revised: 08/20/2016 Document Reviewed: 08/20/2016 Elsevier Interactive Patient Education  [employer].

## 2017-10-13 NOTE — Progress Notes (Signed)
Checked frequently. Rested quietly at long intervals with eyes closed, resp even and unlabored. No acute distress noted. No c/o voiced. Uneventful night.

## 2017-10-13 NOTE — Progress Notes (Signed)
[facility] Mix  [name] [place], [place] [address] Phone: [phone]  FAX: [name] [phone] 08/09/1951  CARE TEAM:  PCP: [name], MD  Outpatient Care Team: Patient Care Team: [name], MD as PCP - General (Family Medicine) Michael Boston, MD as Consulting Physician (General Surgery) Irene Shipper, MD as Consulting Physician (Gastroenterology) Wyatt Portela, MD as Consulting Physician (Hematology and Oncology)  Inpatient Treatment Team: Treatment Team: Attending Provider: [name], MD; Technician: Leda Quail, NT; Consulting Physician: Edison Pace Md, MD; Consulting Physician: Jerene Bears, MD; Rounding Team: Ian Bushman, MD; Technician: Sueanne Margarita, NT; Registered Nurse: Heloise Ochoa, RN; Registered Nurse: Rossie Muskrat, RN; Technician: Abbe Amsterdam, NT; Registered Nurse: Celedonio Savage, RN; Consulting Physician: Michel Bickers, MD; Technician: Ananias Pilgrim, NT   Problem List:   Principal Problem:   Abscess of sigmoid colon due to diverticulitis Active Problems:   Hyperlipemia   Essential hypertension   GERD   Fatty liver   Thrombocytopenia ([place])   Immunosuppression due to drug therapy to Rheumatoid Arthritis      * No surgery found *     Assessment/Plan  Diverticulitis with abscess  - CT cystogramshowed no obvious communication between bladder and sigmoid, gas in bladder wall likely 2/2 abscess but cannot fully exclude fistula, -IR is unable to drain abscess - WBC  normalizing.   - repeat CT shows persistent pelvic fluid collection between the bladder and rectosigmoid region, however slightly smaller.  At least stable. -agree with infectious disease she would benefit from prolonged IV antibiotics.  They recommend 2 weeks of ertapenem with repeat CT scan to look for abscess resolution.  Patient is not clinically toxic, so continue plan  to avoid operation at this time.  At some point once abscess has resolved, she would benefit from segmental rectosigmoid resection of the area of perforation.  Colonoscopy the day before ideally to rule out malignancy or other possible etiology.  Patient due to follow-up with her gastrologist, [name], next month.  Most likely different diverticular urology.  Hopefully can do minimally invasive robotic rectosigmoid resection with primary anastomosis and avoid multiple stage procedure including temporary colostomy  FEN: clears VTE: SCD;s ID: Zosyn 11/05>> changing to ertapenem for a daily dose convenience  -mobilize as tolerated to help recovery   We will continue to follow.    40 minutes spent in review, evaluation, examination, counseling, and coordination of care.  More than 50% of that time was spent in counseling.  [name], M.D., F.A.C.S. Gastrointestinal and Minimally Invasive Surgery [facility] [address] [place], [place] [address] [phone] Main / Paging   10/13/2017    Subjective: (Chief complaint)  Feeling much better.  Tolerated soft diet.  PICC line placed.  Objective:  Vital signs:  Vitals:   10/12/17 0509 10/12/17 1400 10/12/17 2140 10/13/17 0555  BP: 136/63 (!) 105/47 135/67 117/63  Pulse: 71 86 76 72  Resp: '16 16 18 16  '$ Temp: 98 F (36.7 C) 98 F (36.7 C) 97.9 F (36.6 C) 98.1 F (36.7 C)  TempSrc: Oral  Oral Oral  SpO2: 100% 99% 99% 95%  Weight:      Height:        Last BM Date: 10/11/17  Intake/Output   Yesterday:  11/10 0701 - 11/11 0700 In: 1525 [P.O.:1475; IV Piggyback:50] Out: 2050 [Urine:2050] This shift:  No  intake/output data recorded.  Bowel function:  Flatus: YES  BM:  YES  Drain: (No drain)   Physical Exam:  General: Pt awake/alert/oriented x4 in no acute distress Eyes: PERRL, normal EOM.  Sclera clear.  No icterus Neuro: CN II-XII intact w/o focal  sensory/motor deficits. Lymph: No head/neck/groin lymphadenopathy Psych:  No delerium/psychosis/paranoia HENT: Normocephalic, Mucus membranes moist.  No thrush Neck: Supple, No tracheal deviation Chest: No chest wall pain w good excursion CV:  Pulses intact.  Regular rhythm MS: Normal AROM mjr joints.  No obvious deformity  Abdomen: Soft.  Nondistended.  Nontender.  No evidence of peritonitis.  No incarcerated hernias.  Ext:  No deformity.  No mjr edema.  No cyanosis Skin: No petechiae / purpura  Results:   Labs: Results for orders placed or performed during the hospital encounter of 10/07/17 (from the past 48 hour(s))  CBC with Differential/Platelet     Status: Abnormal   Collection Time: 10/12/17  5:46 AM  Result Value Ref Range   WBC 10.9 (H) 4.0 - 10.5 K/uL   RBC 4.28 3.87 - 5.11 MIL/uL   Hemoglobin 12.9 12.0 - 15.0 g/dL   HCT 39.2 36.0 - 46.0 %   MCV 91.6 78.0 - 100.0 fL   MCH 30.1 26.0 - 34.0 pg   MCHC 32.9 30.0 - 36.0 g/dL   RDW 15.6 (H) 11.5 - 15.5 %   Platelets 118 (L) 150 - 400 K/uL   Neutrophils Relative % 53 %   Lymphocytes Relative 18 %   Monocytes Relative 28 %   Eosinophils Relative 1 %   Basophils Relative 0 %   Neutro Abs 5.7 1.7 - 7.7 K/uL   Lymphs Abs 2.0 0.7 - 4.0 K/uL   Monocytes Absolute 3.1 (H) 0.1 - 1.0 K/uL   Eosinophils Absolute 0.1 0.0 - 0.7 K/uL   Basophils Absolute 0.0 0.0 - 0.1 K/uL   WBC Morphology MILD LEFT SHIFT (1-5% METAS, OCC MYELO, OCC BANDS)   Basic metabolic panel     Status: None   Collection Time: 10/12/17  5:46 AM  Result Value Ref Range   Sodium 142 135 - 145 mmol/L    Comment: DELTA CHECK NOTED NO VISIBLE HEMOLYSIS    Potassium 4.2 3.5 - 5.1 mmol/L   Chloride 105 101 - 111 mmol/L   CO2 30 22 - 32 mmol/L   Glucose, Bld 92 65 - 99 mg/dL   BUN 6 6 - 20 mg/dL   Creatinine, Ser 0.85 0.44 - 1.00 mg/dL   Calcium 10.0 8.9 - 10.3 mg/dL   GFR calc non Af Amer >60 >60 mL/min   GFR calc Af Amer >60 >60 mL/min    Comment:  (NOTE) The eGFR has been calculated using the CKD EPI equation. This calculation has not been validated in all clinical situations. eGFR's persistently <60 mL/min signify possible Chronic Kidney Disease.    Anion gap 7 5 - 15  Magnesium     Status: None   Collection Time: 10/12/17  5:46 AM  Result Value Ref Range   Magnesium 1.7 1.7 - 2.4 mg/dL  CBC with Differential/Platelet     Status: Abnormal   Collection Time: 10/13/17  4:35 AM  Result Value Ref Range   WBC 12.1 (H) 4.0 - 10.5 K/uL   RBC 4.10 3.87 - 5.11 MIL/uL   Hemoglobin 12.3 12.0 - 15.0 g/dL   HCT 37.2 36.0 - 46.0 %   MCV 90.7 78.0 - 100.0 fL   MCH 30.0 26.0 -  34.0 pg   MCHC 33.1 30.0 - 36.0 g/dL   RDW 15.3 11.5 - 15.5 %   Platelets 109 (L) 150 - 400 K/uL    Comment: CONSISTENT WITH PREVIOUS RESULT   Neutrophils Relative % 46 %   Neutro Abs 5.6 1.7 - 7.7 K/uL   Lymphocytes Relative 20 %   Lymphs Abs 2.5 0.7 - 4.0 K/uL   Monocytes Relative 33 %   Monocytes Absolute 4.0 (H) 0.1 - 1.0 K/uL   Eosinophils Relative 1 %   Eosinophils Absolute 0.1 0.0 - 0.7 K/uL   Basophils Relative 0 %   Basophils Absolute 0.0 0.0 - 0.1 K/uL  Basic metabolic panel     Status: Abnormal   Collection Time: 10/13/17  4:35 AM  Result Value Ref Range   Sodium 142 135 - 145 mmol/L   Potassium 3.5 3.5 - 5.1 mmol/L   Chloride 105 101 - 111 mmol/L   CO2 27 22 - 32 mmol/L   Glucose, Bld 105 (H) 65 - 99 mg/dL   BUN 10 6 - 20 mg/dL   Creatinine, Ser 0.70 0.44 - 1.00 mg/dL   Calcium 9.7 8.9 - 10.3 mg/dL   GFR calc non Af Amer >60 >60 mL/min   GFR calc Af Amer >60 >60 mL/min    Comment: (NOTE) The eGFR has been calculated using the CKD EPI equation. This calculation has not been validated in all clinical situations. eGFR's persistently <60 mL/min signify possible Chronic Kidney Disease.    Anion gap 10 5 - 15  Magnesium     Status: None   Collection Time: 10/13/17  4:35 AM  Result Value Ref Range   Magnesium 1.8 1.7 - 2.4 mg/dL     Imaging / Studies: Ct Abdomen Pelvis W Contrast  Result Date: 10/11/2017 CLINICAL DATA:  Persistent diverticulitis with bladder abscess. Follow-up. EXAM: CT ABDOMEN AND PELVIS WITH CONTRAST TECHNIQUE: Multidetector CT imaging of the abdomen and pelvis was performed using the standard protocol following bolus administration of intravenous contrast. CONTRAST:  18m ISOVUE-300 IOPAMIDOL (ISOVUE-300) INJECTION 61%, 336mISOVUE-300 IOPAMIDOL (ISOVUE-300) INJECTION 61% COMPARISON:  Pelvis CT from 3 days ago. Abdominal CT from 4 days ago. FINDINGS: Lower chest:  Negative Hepatobiliary: Subcentimeter presumed cyst in the subcapsular left liver. Possible hepatic steatosis. No evidence of biliary obstruction or stone. Pancreas: Unremarkable. Spleen: Unremarkable. Adrenals/Urinary Tract: Negative adrenals. No hydronephrosis or stone. Tiny probable cysts in the right kidney. See below Stomach/Bowel: Sigmoid wall thickening, some combination of inflammation and muscular hypertrophy, stable. There is a 30 x 10 mm abscess between the sigmoid colon and the thick upper bladder wall. More anteriorly is a subcentimeter collection that previously contains gas. Oral contrast extended through the rectum and does not opacify the collections or the bladder. The larger collection is contiguous with the vaginal cuff that does not appear thickened. Vascular/Lymphatic: No acute vascular abnormality. No mass or adenopathy. Reproductive:Hysterectomy.  Negative adnexae. Other: No ascites or pneumoperitoneum. Musculoskeletal: No acute abnormalities. Disc and facet degeneration with mild scoliosis. No acute or aggressive finding. IMPRESSION: 3 x 1 cm abscess between the sigmoid, bladder, and vaginal cuff that is stable from admission CT 4 days ago. A subcentimeter more anterior collection along the bladder contains fluid instead of gas seen previously. There is cystitis along the dome without evidence of transmural fistula. Oral contrast  reached the anus and does not opacify the abscess or bladder. Electronically Signed   By: JoMonte Fantasia.D.   On: 10/11/2017 14:10  Medications / Allergies: per chart  Antibiotics: Anti-infectives (From admission, onward)   Start     Dose/Rate Route Frequency Ordered Stop   10/12/17 1800  ertapenem (INVANZ) 1 g in sodium chloride 0.9 % 50 mL IVPB     1 g 100 mL/hr over 30 Minutes Intravenous Every 24 hours 10/12/17 1442     10/09/17 1400  hydroxychloroquine (PLAQUENIL) tablet 400 mg     400 mg Oral Daily 10/09/17 1356     10/08/17 0300  piperacillin-tazobactam (ZOSYN) IVPB 3.375 g  Status:  Discontinued     3.375 g 12.5 mL/hr over 240 Minutes Intravenous Every 8 hours 10/07/17 1900 10/12/17 1442   10/07/17 1900  piperacillin-tazobactam (ZOSYN) IVPB 3.375 g     3.375 g 100 mL/hr over 30 Minutes Intravenous  Once 10/07/17 1853 10/07/17 1946        Note: Portions of this report may have been transcribed using voice recognition software. Every effort was made to ensure accuracy; however, inadvertent computerized transcription errors may be present.   Any transcriptional errors that result from this process are unintentional.     [name], M.D., F.A.C.S. Gastrointestinal and Minimally Invasive Surgery [facility] [address] [place], [place] [address] [phone] Main / Paging   10/13/2017

## 2017-10-13 NOTE — Progress Notes (Signed)
Patient ID: SHYNIA DALEO, female   DOB: 06-27-1951, 66 y.o.   MRN: [id number]          [facility] for Infectious Disease  Date of Admission:  10/07/2017   Total days of antibiotics 6        Day 2 ertapenem         ASSESSMENT: She is improving slowly on therapy for persistent sigmoid diverticular abscess.  PLAN: 1. Continue ertapenem at least through 10/28/2017 2. Midline catheter placement today 3. I will sign off now  Outpatient IV antibiotic orders Diagnosis: Sigmoid diverticular abscess  Culture Result: None available       Allergies  Allergen Reactions  . Remicade [Infliximab] Swelling and Other (See Comments)    Facial swelling, throat was closing up.     OPAT Orders Discharge antibiotics: Per pharmacy protocol ertapenem  Duration: 2 weeks End Date: 10/28/2017  [facility] Care Per Protocol:  Labs weekly while on IV antibiotics: _x_ CBC with differential _x_ BMP __ CMP __ CRP __ ESR __ Vancomycin trough  __ Please pull PIC at completion of IV antibiotics _x_ Please leave PIC in place until doctor has seen patient or been notified  Fax weekly labs to [phone]  Clinic Follow Up Appt: 10/28/2017   Principal Problem:   Abscess of sigmoid colon due to diverticulitis Active Problems:   Immunosuppression due to drug therapy to Rheumatoid Arthritis   Hyperlipemia   Essential hypertension   GERD   Fatty liver   Thrombocytopenia ([facility])   Scheduled Meds: . hydrochlorothiazide  25 mg Oral Daily  . hydroxychloroquine  400 mg Oral Daily  . predniSONE  5 mg Oral Daily  . sodium chloride flush  3 mL Intravenous Q12H   Continuous Infusions: . ertapenem Stopped (10/12/17 1829)   PRN Meds:.morphine injection, ondansetron **OR** ondansetron (ZOFRAN) IV   SUBJECTIVE: She is still having some mild to moderate left lower quadrant pain but this is much better than upon admission. She ate a reasonably good breakfast without difficulty.  She is not having any nausea or vomiting.  Review of Systems: Review of Systems  Constitutional: Negative for chills, diaphoresis and fever.  Respiratory: Negative for cough.   Cardiovascular: Negative for chest pain.  Gastrointestinal: Positive for abdominal pain. Negative for diarrhea, nausea and vomiting.    Allergies  Allergen Reactions  . Remicade [Infliximab] Swelling and Other (See Comments)    Facial swelling, throat was closing up.     OBJECTIVE: Vitals:   10/12/17 0509 10/12/17 1400 10/12/17 2140 10/13/17 0555  BP: 136/63 (!) 105/47 135/67 117/63  Pulse: 71 86 76 72  Resp: _0 Temp: 98 F (36.7 C) 98 F (36.7 C) 97.9 F (36.6 C) 98.1 F (36.7 C)  TempSrc: Oral  Oral Oral  SpO2: 100% 99% 99% 95%  Weight:      Height:       Body mass index is 32.87 kg/m.  Physical Exam  Constitutional: She is oriented to person, place, and time.  She is in good spirits resting quietly in bed.  Cardiovascular: Normal rate and regular rhythm.  No murmur heard. Pulmonary/Chest: Effort normal and breath sounds normal.  Abdominal: Soft. She exhibits no distension and no mass. There is tenderness.  Neurological: She is alert and oriented to person, place, and time.  Psychiatric: Mood and affect normal.    Lab Results Lab Results  Component Value Date   WBC 12.1 (H) 10/13/2017   HGB 12.3 10/13/2017  HCT 37.2 10/13/2017   MCV 90.7 10/13/2017   PLT 109 (L) 10/13/2017    Lab Results  Component Value Date   CREATININE 0.70 10/13/2017   BUN 10 10/13/2017   NA 142 10/13/2017   K 3.5 10/13/2017   CL 105 10/13/2017   CO2 27 10/13/2017    Lab Results  Component Value Date   ALT 17 10/11/2017   AST 25 10/11/2017   ALKPHOS 45 10/11/2017   BILITOT 0.6 10/11/2017     Microbiology: Recent Results (from the past 240 hour(s))  Urine culture     Status: Abnormal   Collection Time: 10/07/17  3:58 PM  Result Value Ref Range Status   Specimen Description URINE,  CLEAN CATCH  Final   Special Requests NONE  Final   Culture (A)  Final    <10,000 COLONIES/mL INSIGNIFICANT GROWTH Performed at [facility], [place] [address] [place], [place] [address]    Report Status 10/09/2017 FINAL  Final    Michel Bickers, MD [facility] [phone] pager   [phone] cell 10/13/2017, 10:28 AM

## 2017-10-13 NOTE — Progress Notes (Signed)
Patient ID: Morgan [name], female   DOB: [birth year], 66 y.o.   MRN: [id number]     Progress Note   Subjective    Feeling like she is making progress, some aching in LLQ , no current dysuria To  Have PICC  Line placed today - after consult with ID yesterday plan is for 2 weeks of IV abx at home - switching to [place].   Objective   Vital signs in last 24 hours: Temp:  [97.9 F (36.6 C)-98.1 F (36.7 C)] 98.1 F (36.7 C) (11/11 0555) Pulse Rate:  [72-86] 72 (11/11 0555) Resp:  [16-18] 16 (11/11 0555) BP: (105-135)/(47-67) 117/63 (11/11 0555) SpO2:  [95 %-99 %] 95 % (11/11 0555) Last BM Date: 10/11/17 General:    white female in NAD Heart:  Regular rate and rhythm; no murmurs Lungs: Respirations even and unlabored, lungs CTA bilaterally Abdomen:  Soft, tender LLQ , rebound, and nondistended. Normal bowel sounds. Extremities:  Without edema. Neurologic:  Alert and oriented,  grossly normal neurologically. Psych:  Cooperative. Normal mood and affect.  Intake/Output from previous day: 11/10 0701 - 11/11 0700 In: 1525 [P.O.:1475; IV Piggyback:50] Out: 2050 [Urine:2050] Intake/Output this shift: No intake/output data recorded.  Lab Results: Recent Labs    10/11/17 0437 10/12/17 0546 10/13/17 0435  WBC 15.9* 10.9* 12.1*  HGB 12.3 12.9 12.3  HCT 37.9 39.2 37.2  PLT 114* 118* 109*   BMET Recent Labs    10/11/17 0437 10/12/17 0546 10/13/17 0435  NA 139 142 142  K 3.4* 4.2 3.5  CL 102 105 105  CO2 28 30 27   GLUCOSE 96 92 105*  BUN 8 6 10   CREATININE 0.81 0.85 0.70  CALCIUM 9.7 10.0 9.7   LFT Recent Labs    10/11/17 0437  PROT 7.4  ALBUMIN 3.5  AST 25  ALT 17  ALKPHOS 45  BILITOT 0.6   PT/INR No results for input(s): LABPROT, INR in the last 72 hours.  Studies/Results: Ct Abdomen Pelvis W Contrast  Result Date: 10/11/2017 CLINICAL DATA:  Persistent diverticulitis with bladder abscess. Follow-up. EXAM: CT ABDOMEN AND PELVIS WITH CONTRAST TECHNIQUE:  Multidetector CT imaging of the abdomen and pelvis was performed using the standard protocol following bolus administration of intravenous contrast. CONTRAST:  19mL ISOVUE-300 IOPAMIDOL (ISOVUE-300) INJECTION 61%, 37mL ISOVUE-300 IOPAMIDOL (ISOVUE-300) INJECTION 61% COMPARISON:  Pelvis CT from 3 days ago. Abdominal CT from 4 days ago. FINDINGS: Lower chest:  Negative Hepatobiliary: Subcentimeter presumed cyst in the subcapsular left liver. Possible hepatic steatosis. No evidence of biliary obstruction or stone. Pancreas: Unremarkable. Spleen: Unremarkable. Adrenals/Urinary Tract: Negative adrenals. No hydronephrosis or stone. Tiny probable cysts in the right kidney. See below Stomach/Bowel: Sigmoid wall thickening, some combination of inflammation and muscular hypertrophy, stable. There is a 30 x 10 mm abscess between the sigmoid colon and the thick upper bladder wall. More anteriorly is a subcentimeter collection that previously contains gas. Oral contrast extended through the rectum and does not opacify the collections or the bladder. The larger collection is contiguous with the vaginal cuff that does not appear thickened. Vascular/Lymphatic: No acute vascular abnormality. No mass or adenopathy. Reproductive:Hysterectomy.  Negative adnexae. Other: No ascites or pneumoperitoneum. Musculoskeletal: No acute abnormalities. Disc and facet degeneration with mild scoliosis. No acute or aggressive finding. IMPRESSION: 3 x 1 cm abscess between the sigmoid, bladder, and vaginal cuff that is stable from admission CT 4 days ago. A subcentimeter more anterior collection along the bladder contains fluid instead of gas seen previously.  There is cystitis along the dome without evidence of transmural fistula. Oral contrast reached the anus and does not opacify the abscess or bladder. Electronically Signed   By: Monte Fantasia M.D.   On: 10/11/2017 14:10       Assessment / Plan:    #1 66 yo WF with complicated refractory  diverticulitis sigmoid with persistent abscess between bladder and sigmoid. #2 hx adenomatous  Polyps  Plan; Plan is for PICC placement, then home on IV Invanz x 14 days , then re-image - Dr Megan Salon to follow up.  We will plan on follow up in office end of November  after above -will need to decide if appropriate  to proceed with  Colonoscopy with Dr Henrene Pastor on 12/19. Our office will call her early next week with appt. Pt knows to call for any change or worsening of sxs, fever,chills etc in interim        Contact  Amy Esterwood, P.A.-C               [phone]      Principal Problem:   Abscess of sigmoid colon due to diverticulitis Active Problems:   Hyperlipemia   Essential hypertension   GERD   Fatty liver   Thrombocytopenia ([place])   Immunosuppression due to drug therapy to Rheumatoid Arthritis     LOS: 6 days   Amy Esterwood  10/13/2017, 9:06 AM

## 2017-10-13 NOTE — Care Management Note (Signed)
Case Management Note  Patient Details  Name: Morgan [name] MRN: [id number] Date of Birth: 1951/09/08  Subjective/Objective:    Complicated refractory diverticulitis with persistent abscess                Action/Plan: Discharge Planning: NCM spoke to pt and offered choice for [facility]. Pt requested [facility] for HH. Contacted [facility] with new referral for [facility] and IV abx. Waiting final orders for IV abx dose and HHRN order with F2F. Pt states her dtr and husband will be at home to assist with care.   PCP ANDY, CAMILLE L MD  Expected Discharge Date:                  Expected Discharge Plan:  [place]  In-House Referral:  NA  Discharge planning Services  CM Consult  Post Acute Care Choice:  Home Health Choice offered to:  Patient  DME Arranged:  N/A DME Agency:  NA  HH Arranged:  RN, IV Antibiotics HH Agency:  [facility]  Status of Service:  Completed, signed off  If discussed at [place] of Stay Meetings, dates discussed:    Additional Comments:  Erenest Rasher, RN 10/13/2017, 9:55 AM

## 2017-10-14 LAB — BASIC METABOLIC PANEL
ANION GAP: 8 (ref 5–15)
BUN: 16 mg/dL (ref 6–20)
CHLORIDE: 104 mmol/L (ref 101–111)
CO2: 29 mmol/L (ref 22–32)
Calcium: 9.8 mg/dL (ref 8.9–10.3)
Creatinine, Ser: 0.88 mg/dL (ref 0.44–1.00)
GFR calc non Af Amer: >60 mL/min (ref >60)
GLUCOSE: 98 mg/dL (ref 65–99)
Potassium: 3.8 mmol/L (ref 3.5–5.1)
Sodium: 141 mmol/L (ref 135–145)

## 2017-10-14 LAB — CBC WITH DIFFERENTIAL/PLATELET
BASOS ABS: 0 10*3/uL (ref 0.0–0.1)
BASOS PCT: 0 %
Eosinophils Absolute: 0.1 10*3/uL (ref 0.0–0.7)
Eosinophils Relative: 1 %
HEMATOCRIT: 37.6 % (ref 36.0–46.0)
HEMOGLOBIN: 12.4 g/dL (ref 12.0–15.0)
LYMPHS PCT: 20 %
Lymphs Abs: 2.2 10*3/uL (ref 0.7–4.0)
MCH: 30 pg (ref 26.0–34.0)
MCHC: 33 g/dL (ref 30.0–36.0)
MCV: 91 fL (ref 78.0–100.0)
MONOS PCT: 28 %
Monocytes Absolute: 3.1 10*3/uL — ABNORMAL HIGH (ref 0.1–1.0)
NEUTROS ABS: 5.6 10*3/uL (ref 1.7–7.7)
NEUTROS PCT: 51 %
Platelets: 106 10*3/uL — ABNORMAL LOW (ref 150–400)
RBC: 4.13 MIL/uL (ref 3.87–5.11)
RDW: 15.5 % (ref 11.5–15.5)
WBC: 11.1 10*3/uL — ABNORMAL HIGH (ref 4.0–10.5)

## 2017-10-14 LAB — MAGNESIUM: Magnesium: 1.8 mg/dL (ref 1.7–2.4)

## 2017-10-14 MED ORDER — ONDANSETRON HCL 4 MG PO TABS: 4.0000 mg | ORAL_TABLET | Freq: Four times a day (QID) | ORAL | 0 refills | Status: DC | PRN

## 2017-10-14 MED ORDER — ERTAPENEM IV (FOR PTA / DISCHARGE USE ONLY): 1.0000 g | INTRAVENOUS | 0 refills | Status: AC

## 2017-10-14 MED ORDER — TRAMADOL HCL 50 MG PO TABS: 50.0000 mg | ORAL_TABLET | Freq: Four times a day (QID) | ORAL | 0 refills | Status: DC | PRN

## 2017-10-14 MED ORDER — POLYETHYLENE GLYCOL 3350 17 G PO PACK: 17.0000 g | PACK | Freq: Two times a day (BID) | ORAL | Status: DC | PRN

## 2017-10-14 NOTE — Progress Notes (Signed)
Patient verbalized understanding of discharge instructions. Patient is stable at discharge. Patient was given prescriptions and AVS.

## 2017-10-14 NOTE — Progress Notes (Signed)
[facility] Progress Note     Subjective: CC:  Pain improved. Tolerating regular diet. Denies fever, chills, nausea, vomiting. Last BM a couple days ago. +flatus.  Objective: Vital signs in last 24 hours: Temp:  [97.8 F (36.6 C)-98.4 F (36.9 C)] 98.4 F (36.9 C) (11/12 0635) Pulse Rate:  [69-82] 69 (11/12 0635) Resp:  [16-18] 18 (11/12 0635) BP: (113-123)/(59-79) 120/59 (11/12 0635) SpO2:  [98 %-100 %] 98 % (11/12 0635) Last BM Date: 10/11/17  Intake/Output from previous day: 11/11 0701 - 11/12 0700 In: 1320 [P.O.:1320] Out: 1900 [Urine:1900] Intake/Output this shift: No intake/output data recorded.  PE: Gen:  Alert, NAD, pleasant Card:  Regular rate and rhythm, pedal pulses 2+ BL  Pulm:  Normal effort, clear to auscultation bilaterally Abd: Soft, mild ttp lower, central abdomen, no peritonitis, +BS  Skin: warm and dry, no rashes  Psych: A&Ox3   Lab Results:  Recent Labs    10/13/17 0435 10/14/17 0545  WBC 12.1* 11.1*  HGB 12.3 12.4  HCT 37.2 37.6  PLT 109* 106*   BMET Recent Labs    10/13/17 0435 10/14/17 0545  NA 142 141  K 3.5 3.8  CL 105 104  CO2 27 29  GLUCOSE 105* 98  BUN 10 16  CREATININE 0.70 0.88  CALCIUM 9.7 9.8   PT/INR No results for input(s): LABPROT, INR in the last 72 hours. CMP     Component Value Date/Time   NA 141 10/14/2017 0545   NA 139 06/28/2017 0851   K 3.8 10/14/2017 0545   K 3.8 06/28/2017 0851   CL 104 10/14/2017 0545   CO2 29 10/14/2017 0545   CO2 28 06/28/2017 0851   GLUCOSE 98 10/14/2017 0545   GLUCOSE 94 06/28/2017 0851   BUN 16 10/14/2017 0545   BUN 21.4 06/28/2017 0851   CREATININE 0.88 10/14/2017 0545   CREATININE 1.0 06/28/2017 0851   CALCIUM 9.8 10/14/2017 0545   CALCIUM 10.8 (H) 06/28/2017 0851   PROT 7.4 10/11/2017 0437   PROT 7.9 06/28/2017 0851   ALBUMIN 3.5 10/11/2017 0437   ALBUMIN 4.2 06/28/2017 0851   AST 25 10/11/2017 0437   AST 29 06/28/2017 0851   ALT 17 10/11/2017 0437   ALT 19 06/28/2017 0851   ALKPHOS 45 10/11/2017 0437   ALKPHOS 49 06/28/2017 0851   BILITOT 0.6 10/11/2017 0437   BILITOT 0.68 06/28/2017 0851   GFRNONAA >60 10/14/2017 0545   GFRNONAA 61 11/18/2013 0813   GFRAA >60 10/14/2017 0545   GFRAA 71 11/18/2013 0813   Lipase     Component Value Date/Time   LIPASE 28 10/07/2017 1646       Studies/Results: Dg Chest Port 1 View  Result Date: 10/13/2017 CLINICAL DATA:  Midline placement. EXAM: PORTABLE CHEST 1 VIEW COMPARISON:  01/19/2013 FINDINGS: No central venous catheter is seen. Cardiomediastinal silhouette is normal. Mediastinal contours appear intact. Mild calcific atherosclerotic disease of the aorta. There is no evidence of focal airspace consolidation, pleural effusion or pneumothorax. Osseous structures are without acute abnormality. Soft tissues are grossly normal. IMPRESSION: No active disease. Electronically Signed   By: Fidela Salisbury M.D.   On: 10/13/2017 12:49    Anti-infectives: Anti-infectives (From admission, onward)   Start     Dose/Rate Route Frequency Ordered Stop   10/12/17 1800  ertapenem (INVANZ) 1 g in sodium chloride 0.9 % 50 mL IVPB     1 g 100 mL/hr over 30 Minutes Intravenous Every 24 hours 10/12/17 1442  10/09/17 1400  hydroxychloroquine (PLAQUENIL) tablet 400 mg     400 mg Oral Daily 10/09/17 1356     10/08/17 0300  piperacillin-tazobactam (ZOSYN) IVPB 3.375 g  Status:  Discontinued     3.375 g 12.5 mL/hr over 240 Minutes Intravenous Every 8 hours 10/07/17 1900 10/12/17 1442   10/07/17 1900  piperacillin-tazobactam (ZOSYN) IVPB 3.375 g     3.375 g 100 mL/hr over 30 Minutes Intravenous  Once 10/07/17 1853 10/07/17 1946     Assessment/Plan Diverticulitis with abscess - CT cystogramshowed no obvious communication between bladder and sigmoid, gas in bladder wall likely 2/2 abscess but cannot fully exclude fistula, -IR is unable to drain abscess  - WBC normalizing (11.1 today from 12.1) -  repeat CT shows persistent pelvic fluid collection between the bladder and rectosigmoid region, however slightly smaller.  At least stable. - agree with infectious disease she would benefit from prolonged IV antibiotics.  They recommend 2 weeks of ertapenem with repeat CT scan to look for abscess resolution.  Patient is not clinically toxic, so continue plan to avoid operation at this time.  At some point once abscess has resolved, she would benefit from segmental rectosigmoid resection of the area of perforation.  Colonoscopy the day before ideally to rule out malignancy or other possible etiology.  Patient due to follow-up with her gastrologist, [name], next month. Hopefully can do minimally invasive robotic rectosigmoid resection with primary anastomosis and avoid multiple stage procedure including temporary colostomy.    FEN: HH VTE: SCD;s ID: Zosyn 11/05>> changing to ertapenem for a daily dose convenience   Stable for discharge from surgical perspective with [facility] for PICC/abx. F/U w/ Dr. Johney Maine in 2-3 weeks after CT has been repeated.  LOS: 7 days    [facility] 10/14/2017, 7:41 AM Pager: [phone] Consults: [phone] Mon-Fri 7:00 am-4:30 pm Sat-Sun 7:00 am-11:30 am

## 2017-10-14 NOTE — Discharge Summary (Signed)
Physician Discharge Summary  Morgan [name]:[id number] DOB: 20-Feb-1951 DOA: 10/07/2017  PCP: [name], MD  Admit date: 10/07/2017 Discharge date: 10/14/2017  Admitted From: Home Disposition: Home   Recommendations for Outpatient Follow-up:  1. Follow up with PCP in 1week 2. Follow-up with infectious diseases/Dr. Michel Bickers in 2 weeks.  Weekly CBC, BMP to be checked while on IV antibiotics 3. Follow-up with general surgery in 2 weeks 4. Follow-up with GI as scheduled 5. Follow-up in the ED if symptoms worsen or new appear 6. Follow-up with rheumatologist in 1-2 weeks   Home Health: Yes Equipment/Devices: PICC line placed on 10/13/2017  Discharge Condition: Stable CODE STATUS: Full Diet recommendation: Heart Healthy   Brief/Interim Summary:  66 year old female with history of RA and recent admission from 09/15/2017-09/18/2017 at an outside hospital for diverticulitis and was discharged home on 10 day course of Omnicef and Flagyl which she completed on 09/25/2017; presented with worsening lower abdominal pain. CT abdomen showed sigmoid wall thickening with tethering to the bladder wall and enhancing fluid collection terminating at the level of the vaginal cuff. She was started on IV antibiotics and GI, general surgery and urology were consulted.  Urology recommended medical management and no current surgical intervention.  Patient tolerated IV antibiotics; leukocytosis improved significantly.  Repeat CAT scan showed persistent abscess; ID recommended IV ertapenem until 10/28/2017.  PICC line was placed.  Patient is ready for discharge.   Discharge Diagnoses:  Principal Problem:   Abscess of sigmoid colon due to diverticulitis Active Problems:   Hyperlipemia   Essential hypertension   GERD   Fatty liver   Thrombocytopenia (HCC)   Immunosuppression due to drug therapy for Rheumatoid Arthritis   Recurrent diverticulitis with abscess:  -Evaluated by GI, general  surgery, IR and urology  -  IR was unable to drain the abscess - Urology consult appreciated: No need for any urological intervention at this moment. CT showed no obvious communication between bladder and sigmoid, gas in the bladder wall likely secondary to abscess but cannot totally exclude fistula - Pain is much improved. Repeat CAT scan on 10/11/2017 short persistent abscess stable in size. - Initially started on intravenous Zosyn; antibiotics changed to ertapenem per ID recommendations. PICC line placed.   Discharged home on IV ertapenem 1 g daily until 10/28/2017 as per ID recommendations.  Weekly CBC, BMP while on intravenous antibiotics.  Outpatient follow-up with ID and general surgery; patient would need repeat CAT scan after completion of antibiotics.  Patient is tolerating diet. -Discharge patient home today  Leukocytosis  - Probably secondary to above.  - Is improved.  Cultures negative  Rheumatoid arthritis: On golimumab, last infusion was held; plaqueniland chronic prednisone 72m.  - Continue plaquenil and home oral prednisone dose - Outpatient follow-up with rheumatologist; hold Golimumab for now.  Essential HTN:  - Monitor blood pressure. Continue hydrochlorothiazide  Hyperlipidemia:  - Resume Fenofibrate  Hypokalemia: Resolved  Thrombocytopenia: Chronic, stable.  - Monitor     Discharge Instructions  Discharge Instructions    Call MD for:  difficulty breathing, headache or visual disturbances   Complete by:  As directed    Call MD for:  extreme fatigue   Complete by:  As directed    Call MD for:  hives   Complete by:  As directed    Call MD for:  persistant dizziness or light-headedness   Complete by:  As directed    Call MD for:  persistant nausea and vomiting   Complete  by:  As directed    Call MD for:  severe uncontrolled pain   Complete by:  As directed    Call MD for:  temperature >100.4   Complete by:  As directed    Diet - low sodium heart  healthy   Complete by:  As directed    Home infusion instructions [facility] May follow [place] Dosing Protocol; May administer Cathflo as needed to maintain patency of vascular access device.; Flushing of vascular access device: per [facility] Protocol: 0.9% NaCl pre/post medica...   Complete by:  As directed    Instructions:  May follow [place] Dosing Protocol   Instructions:  May administer Cathflo as needed to maintain patency of vascular access device.   Instructions:  Flushing of vascular access device: per [facility] Protocol: 0.9% NaCl pre/post medication administration and prn patency; Heparin 100 u/ml, 31m for implanted ports and Heparin 10u/ml, 588mfor all other central venous catheters.   Instructions:  May follow [facility] Anaphylaxis Protocol for First Dose Administration in the home: 0.9% NaCl at 25-50 ml/hr to maintain IV access for protocol meds. Epinephrine 0.3 ml IV/IM PRN and Benadryl 25-50 IV/IM PRN s/s of anaphylaxis.   Instructions:  [facility] Coordinator (RN) to assist per patient IV care needs in the home PRN.   Increase activity slowly   Complete by:  As directed      Allergies as of 10/14/2017      Reactions   Remicade [infliximab] Swelling, Other (See Comments)   Facial swelling, throat was closing up.       Medication List    STOP taking these medications   golimumab 50 MG/4ML Soln injection Commonly known as:  SIMPONI ARIA     TAKE these medications   acetaminophen 500 MG tablet Commonly known as:  TYLENOL Take 500 mg by mouth every 6 (six) hours as needed.   aspirin 81 MG tablet Take 81 mg by mouth daily. Takes 3 times weekly due to bruising   calcium carbonate 600 MG Tabs tablet Commonly known as:  OS-CAL Take 2 tablets by mouth daily.   ertapenem IVPB Commonly known as:  INVANZ Inject 1 g daily for 15 doses into the vein. Indication:  Diverticular abscess Last Day of Therapy:  10/28/2017 Labs - Once weekly:  CBC/D and  BMP, Labs - Every other week:  ESR and CRP   fenofibrate 160 MG tablet Take 1 tablet (160 mg total) by mouth daily.   fexofenadine 180 MG tablet Commonly known as:  ALLEGRA Take 180 mg by mouth daily.   hydrochlorothiazide 25 MG tablet Commonly known as:  HYDRODIURIL Take 25 mg by mouth daily.   hydroxychloroquine 200 MG tablet Commonly known as:  PLAQUENIL Take 400 mg by mouth daily.   hyoscyamine 0.125 MG SL tablet Commonly known as:  LEVSIN SL Take 1 tablet (0.125 mg total) by mouth every 6 (six) hours as needed.   multivitamin tablet Take 1 tablet by mouth daily.   ondansetron 4 MG tablet Commonly known as:  ZOFRAN Take 1 tablet (4 mg total) every 6 (six) hours as needed by mouth for nausea.   polyethylene glycol packet Commonly known as:  MIRALAX / GLYCOLAX Take 1 packet daily as needed by mouth.   predniSONE 5 MG tablet Commonly known as:  DELTASONE Take 5 mg by mouth daily.   PROBIOTIC PO Take by mouth daily.   simvastatin 40 MG tablet Commonly known as:  ZOCOR Take 1 tablet (  40 mg total) by mouth at bedtime.   traMADol 50 MG tablet Commonly known as:  ULTRAM Take 1 tablet (50 mg total) every 6 (six) hours as needed by mouth for severe pain.            Home Infusion Instuctions  (From admission, onward)        Start     Ordered   10/14/17 0000  Home infusion instructions [facility] May follow [place] Dosing Protocol; May administer Cathflo as needed to maintain patency of vascular access device.; Flushing of vascular access device: per [facility] Protocol: 0.9% NaCl pre/post medica...    Question Answer Comment  Instructions May follow [place] Dosing Protocol   Instructions May administer Cathflo as needed to maintain patency of vascular access device.   Instructions Flushing of vascular access device: per [facility] Protocol: 0.9% NaCl pre/post medication administration and prn patency; Heparin 100 u/ml, 15m for implanted ports and Heparin  10u/ml, 541mfor all other central venous catheters.   Instructions May follow [facility] Anaphylaxis Protocol for First Dose Administration in the home: 0.9% NaCl at 25-50 ml/hr to maintain IV access for protocol meds. Epinephrine 0.3 ml IV/IM PRN and Benadryl 25-50 IV/IM PRN s/s of anaphylaxis.   Instructions [facility] Infusion Coordinator (RN) to assist per patient IV care needs in the home PRN.      10/14/17 0820     Follow-up Information    WiCeasar MonsMD Follow up in 1 month(s).   Specialty:  Urology Contact information: [address] [phone]      [facility], [facility] Follow up.   Specialty:  [place]:  Home Health RN- agency will call to arrange initial visit Contact information: [phone]      GrMichael BostonMD. Schedule an appointment as soon as possible for a visit in 2 week(s).   Specialty:  General Surgery Why:  To follow-up after her discharge and antibiotics.  To discuss elective resection of your rectosigmoid colon that has perforated to prevent further attacks.  Possible fistula repair to your bladder Contact information: [phone]        AnLeamon ArntMD. Schedule an appointment as soon as possible for a visit in 1 week(s).   Specialty:  Family Medicine Contact information: [address] [phone]      CaMichel BickersMD. Schedule an appointment as soon as possible for a visit in 1 week(s).   Specialty:  Infectious Diseases Why:  2 weeks Contact information: [address] [place] [phone]        Allergies  Allergen Reactions  . Remicade [Infliximab] Swelling and Other (See Comments)    Facial swelling, throat was closing up.     Consultations: GI, general surgery, urology,  ID      Procedures/Studies: Ct Pelvis W Contrast  Result Date: 10/08/2017 CLINICAL DATA:  Rheumatoid arthritis.  Worsening abdominal pain. EXAM: CT PELVIS WITH CONTRAST TECHNIQUE: Multidetector CT imaging of the pelvis was performed using the standard protocol following the bolus administration of intravenous contrast. CONTRAST:  7548mSOVUE-300 IOPAMIDOL (ISOVUE-300) INJECTION 61%, 81m29mOVUE-300 IOPAMIDOL (ISOVUE-300) INJECTION 61% contrast administered into the bladder through a Foley catheter. COMPARISON:  CT 10/07/2017 for FINDINGS: Urinary Tract: The bladder is distended  with hand injected IV contrast through the Foley catheter. Foley catheter bulb noted within the the lumen of bladder. No bladder filling defect is present. This no evidence of communication between the bladder and the vagina or the sigmoid colon. There is a small gas collection along the serosal serosal surface of the bladder wall measuring 6 mm (image 59, series 5) which is LEFT adjacent to the bladder wall (image 19, series 2). This small pocket of gas is in same vicinity as the elongated fluid collections seen on comparison CT 1 day prior. Collection is less well demonstrated (image 22-18 series 2). Trace amount nondependent gas in the bladder. Contra in the sigmoid colon is favored contrast from CT 1 day prior. No Communication with the bladder. Bowel: Thickening of the sigmoid colon similar comparison exam. Circumferential bowel wall thickening to 110 mm. No macro perforation Vascular/Lymphatic: No pelvic lymphadenopathy. Reproductive:  Prostate normal Other:  None Musculoskeletal: No aggressive osseous lesion IMPRESSION: 1. No obvious communication between the bladder and the sigmoid colon. However small gas collection along the bladder wall and small a gas within the bladder itself could indicate occult fistula. 2. High-density material within the sigmoid colon is favored contrast from CT 1 day prior. The bowel wall thickening  of sigmoid colon suggests diverticulitis or segmental colitis. Similar comparison exam 3. Small gas collection the wall of bladder likely relates to prior abscess collection which appears slightly improved. 4. Small amount of nondependent gas within the bladder with differential iatrogenic from catheterization or occult fistula. Electronically Signed   By: Suzy Bouchard M.D.   On: 10/08/2017 13:51   Ct Abdomen Pelvis W Contrast  Result Date: 10/11/2017 CLINICAL DATA:  Persistent diverticulitis with bladder abscess. Follow-up. EXAM: CT ABDOMEN AND PELVIS WITH CONTRAST TECHNIQUE: Multidetector CT imaging of the abdomen and pelvis was performed using the standard protocol following bolus administration of intravenous contrast. CONTRAST:  175m ISOVUE-300 IOPAMIDOL (ISOVUE-300) INJECTION 61%, 378mISOVUE-300 IOPAMIDOL (ISOVUE-300) INJECTION 61% COMPARISON:  Pelvis CT from 3 days ago. Abdominal CT from 4 days ago. FINDINGS: Lower chest:  Negative Hepatobiliary: Subcentimeter presumed cyst in the subcapsular left liver. Possible hepatic steatosis. No evidence of biliary obstruction or stone. Pancreas: Unremarkable. Spleen: Unremarkable. Adrenals/Urinary Tract: Negative adrenals. No hydronephrosis or stone. Tiny probable cysts in the right kidney. See below Stomach/Bowel: Sigmoid wall thickening, some combination of inflammation and muscular hypertrophy, stable. There is a 30 x 10 mm abscess between the sigmoid colon and the thick upper bladder wall. More anteriorly is a subcentimeter collection that previously contains gas. Oral contrast extended through the rectum and does not opacify the collections or the bladder. The larger collection is contiguous with the vaginal cuff that does not appear thickened. Vascular/Lymphatic: No acute vascular abnormality. No mass or adenopathy. Reproductive:Hysterectomy.  Negative adnexae. Other: No ascites or pneumoperitoneum. Musculoskeletal: No acute abnormalities. Disc and  facet degeneration with mild scoliosis. No acute or aggressive finding. IMPRESSION: 3 x 1 cm abscess between the sigmoid, bladder, and vaginal cuff that is stable from admission CT 4 days ago. A subcentimeter more anterior collection along the bladder contains fluid instead of gas seen previously. There is cystitis along the dome without evidence of transmural fistula. Oral contrast reached the anus and does not opacify the abscess or bladder. Electronically Signed   By: JoMonte Fantasia.D.   On: 10/11/2017 14:10   Ct Abdomen Pelvis W Contrast  Result Date: 10/07/2017 CLINICAL DATA:  Low pelvic pain with a recent diagnosis of diverticulitis. EXAM:  CT ABDOMEN AND PELVIS WITH CONTRAST TECHNIQUE: Multidetector CT imaging of the abdomen and pelvis was performed using the standard protocol following bolus administration of intravenous contrast. CONTRAST:  184m ISOVUE-300 IOPAMIDOL (ISOVUE-300) INJECTION 61% COMPARISON:  06/20/2012 FINDINGS: Lower chest:  Unremarkable. Hepatobiliary: Tiny subcapsular low-density lesion lateral segment left liver is unchanged compatible with a benign process. There is no evidence for gallstones, gallbladder wall thickening, or pericholecystic fluid. No intrahepatic or extrahepatic biliary dilation. Pancreas: No focal mass lesion. No dilatation of the main duct. No intraparenchymal cyst. No peripancreatic edema. Spleen: No splenomegaly. No focal mass lesion. Adrenals/Urinary Tract: No adrenal nodule or mass. Kidneys are unremarkable. No evidence for hydroureter. Marked thickening left bladder wall noted. Stomach/Bowel: Stomach is nondistended. No gastric wall thickening. No evidence of outlet obstruction. Duodenum is normally positioned as is the ligament of Treitz. Insert duodenal diverticulum No small bowel wall thickening. No small bowel dilatation. The terminal ileum is normal. The appendix is normal. Sigmoid colonic wall remains thickened and ill-defined. In the interval since  the prior study, tethering of the bladder wall to the sigmoid colon has developed (see image 43 of coronal series 5). This abnormal thickening has associated fluid collection measuring 14 x 19 mm in diameter and extending 4.2 cm and oblique craniocaudal length. This abnormal fluid collection tracks posteriorly and inferiorly at the midline, involving the posterior bladder wall and extending down to the level of vaginal cuff. Vascular/Lymphatic: There is abdominal aortic atherosclerosis without aneurysm. There is no gastrohepatic or hepatoduodenal ligament lymphadenopathy. No intraperitoneal or retroperitoneal lymphadenopathy. No pelvic sidewall lymphadenopathy. Reproductive: Uterus is surgically absent. The vaginal cuff is tethered to the posterior wall of the bladder where the above described bladder wall thickening and fluid collection are evident. Other: No intraperitoneal free fluid. Musculoskeletal: Bone windows reveal no worrisome lytic or sclerotic osseous lesions. IMPRESSION: 1. Interval progression of inflammatory changes in the pelvis with sigmoid colon showing ill-defined wall thickening and now being tethered to the bladder which has developed fairly marked asymmetric bladder wall thickening. A rim enhancing fluid collection, compatible with abscess, is either in the posterior bladder wall or tracks immediately along the posterior bladder wall towards the vaginal cuff. Although colovesical fistula cannot be excluded, there is no evidence for gas in the urinary bladder. Imaging features also raise the question of colovaginal fistula although the apparent abscess terminates at the level of vaginal cuff with no air or fluid seen in the vaginal lumen. 2.  Aortic Atherosclerois (ICD10-170.0) Electronically Signed   By: EMisty StanleyM.D.   On: 10/07/2017 18:27   Dg Chest Port 1 View  Result Date: 10/13/2017 CLINICAL DATA:  Midline placement. EXAM: PORTABLE CHEST 1 VIEW COMPARISON:  01/19/2013 FINDINGS:  No central venous catheter is seen. Cardiomediastinal silhouette is normal. Mediastinal contours appear intact. Mild calcific atherosclerotic disease of the aorta. There is no evidence of focal airspace consolidation, pleural effusion or pneumothorax. Osseous structures are without acute abnormality. Soft tissues are grossly normal. IMPRESSION: No active disease. Electronically Signed   By: DFidela SalisburyM.D.   On: 10/13/2017 12:49     Subjective: Seen and examined at bedside.  She does not complain of any abdominal pain this morning.  No overnight fever, nausea or vomiting.  Discharge Exam: Vitals:   10/13/17 2117 10/14/17 0635  BP: 113/64 (!) 120/59  Pulse: 82 69  Resp: 18 18  Temp: 97.8 F (36.6 C) 98.4 F (36.9 C)  SpO2: 98% 98%   Vitals:   10/13/17 0555  10/13/17 1400 10/13/17 2117 10/14/17 0635  BP: 117/63 123/79 113/64 (!) 120/59  Pulse: 72 79 82 69  Resp: _0 Temp: 98.1 F (36.7 C) 98.4 F (36.9 C) 97.8 F (36.6 C) 98.4 F (36.9 C)  TempSrc: Oral  Oral Oral  SpO2: 95% 100% 98% 98%  Weight:      Height:        General: Pt is alert, awake, not in acute distress Cardiovascular: Rate Controlled, S1/S2 + Respiratory: Bilateral decreased breath sounds at bases Abdominal: Soft, NT, ND, bowel sounds + Extremities: no edema, no cyanosis    The results of significant diagnostics from this hospitalization (including imaging, microbiology, ancillary and laboratory) are listed below for reference.     Microbiology: Recent Results (from the past 240 hour(s))  Urine culture     Status: Abnormal   Collection Time: 10/07/17  3:58 PM  Result Value Ref Range Status   Specimen Description URINE, CLEAN CATCH  Final   Special Requests NONE  Final   Culture (A)  Final    <10,000 COLONIES/mL INSIGNIFICANT GROWTH Performed at [facility], [address] [place], [place] [address]    Report Status 10/09/2017 FINAL  Final     Labs: BNP (last 3  results) No results for input(s): BNP in the last 8760 hours. Basic Metabolic Panel: Recent Labs  Lab 10/08/17 0602 10/10/17 0447 10/11/17 0437 10/12/17 0546 10/13/17 0435 10/14/17 0545  NA 141 142 139 142 142 141  K 3.7 3.6 3.4* 4.2 3.5 3.8  CL 105 105 102 105 105 104  CO2 _1 GLUCOSE 88 98 96 92 105* 98  BUN _2 CREATININE 0.72 0.66 0.81 0.85 0.70 0.88  CALCIUM 8.9 9.5 9.7 10.0 9.7 9.8  MG 1.8 1.8 1.8 1.7 1.8 1.8  PHOS 3.4  --   --   --   --   --    Liver Function Tests: Recent Labs  Lab 10/07/17 1646 10/08/17 0602 10/11/17 0437  AST _3 ALT _4 ALKPHOS 49 42 45  BILITOT 0.3 0.5 0.6  PROT 7.5 6.6 7.4  ALBUMIN 3.7 3.4* 3.5   Recent Labs  Lab 10/07/17 1646  LIPASE 28   No results for input(s): AMMONIA in the last 168 hours. CBC: Recent Labs  Lab 10/10/17 0447 10/11/17 0437 10/12/17 0546 10/13/17 0435 10/14/17 0545  WBC 26.0* 15.9* 10.9* 12.1* 11.1*  NEUTROABS 18.8* 9.3* 5.7 5.6 5.6  HGB 12.1 12.3 12.9 12.3 12.4  HCT 36.2 37.9 39.2 37.2 37.6  MCV 90.3 90.9 91.6 90.7 91.0  PLT 104* 114* 118* 109* 106*   Cardiac Enzymes: No results for input(s): CKTOTAL, CKMB, CKMBINDEX, TROPONINI in the last 168 hours. BNP: Invalid input(s): POCBNP CBG: No results for input(s): GLUCAP in the last 168 hours. D-Dimer No results for input(s): DDIMER in the last 72 hours. Hgb A1c No results for input(s): HGBA1C in the last 72 hours. Lipid Profile No results for input(s): CHOL, HDL, LDLCALC, TRIG, CHOLHDL, LDLDIRECT in the last 72 hours. Thyroid function studies No results for input(s): TSH, T4TOTAL, T3FREE, THYROIDAB in the last 72 hours.  Invalid input(s): FREET3 Anemia work up No results for input(s): VITAMINB12, FOLATE, FERRITIN, TIBC, IRON, RETICCTPCT in the last 72 hours. Urinalysis    Component Value Date/Time   COLORURINE YELLOW 10/07/2017 1558   APPEARANCEUR CLEAR 10/07/2017 1558   LABSPEC 1.010 10/07/2017 1558  PHURINE 6.5 10/07/2017 1558   GLUCOSEU NEGATIVE 10/07/2017 1558   GLUCOSEU NEGATIVE 01/13/2013 0842   HGBUR MODERATE (A) 10/07/2017 1558   BILIRUBINUR NEGATIVE 10/07/2017 1558   KETONESUR NEGATIVE 10/07/2017 1558   PROTEINUR NEGATIVE 10/07/2017 1558   UROBILINOGEN 0.2 01/13/2013 0842   NITRITE NEGATIVE 10/07/2017 1558   LEUKOCYTESUR TRACE (A) 10/07/2017 1558   Sepsis Labs Invalid input(s): PROCALCITONIN,  WBC,  LACTICIDVEN Microbiology Recent Results (from the past 240 hour(s))  Urine culture     Status: Abnormal   Collection Time: 10/07/17  3:58 PM  Result Value Ref Range Status   Specimen Description URINE, CLEAN CATCH  Final   Special Requests NONE  Final   Culture (A)  Final    <10,000 COLONIES/mL INSIGNIFICANT GROWTH Performed at [facility], [place] [address] [place], [place] [address]    Report Status 10/09/2017 FINAL  Final     Time coordinating discharge: 35 minutes  SIGNED:   [name], MD  [facility] 10/14/2017, 8:21 AM Pager: [phone]  If 7PM-7AM, please contact night-coverage [email or web address] Password [email or web address]

## 2017-10-15 ENCOUNTER — Other Ambulatory Visit: Payer: Self-pay | Admitting: Internal Medicine

## 2017-10-15 ENCOUNTER — Other Ambulatory Visit: Payer: Self-pay | Admitting: *Deleted

## 2017-10-15 DIAGNOSIS — K5720 Diverticulitis of large intestine with perforation and abscess without bleeding: Secondary | ICD-10-CM | POA: Diagnosis not present

## 2017-10-15 DIAGNOSIS — K5780 Diverticulitis of intestine, part unspecified, with perforation and abscess without bleeding: Secondary | ICD-10-CM | POA: Diagnosis not present

## 2017-10-18 ENCOUNTER — Encounter: Payer: Self-pay | Admitting: Internal Medicine

## 2017-10-21 ENCOUNTER — Encounter: Payer: Self-pay | Admitting: Internal Medicine

## 2017-10-21 ENCOUNTER — Ambulatory Visit
Admission: RE | Admit: 2017-10-21 | Discharge: 2017-10-21 | Disposition: A | Discharge status: Home / Self Care | Payer: Medicare Other | Source: Ambulatory Visit | Attending: Internal Medicine | Admitting: Internal Medicine

## 2017-10-21 DIAGNOSIS — Z5181 Encounter for therapeutic drug level monitoring: Secondary | ICD-10-CM | POA: Diagnosis not present

## 2017-10-21 DIAGNOSIS — K5720 Diverticulitis of large intestine with perforation and abscess without bleeding: Secondary | ICD-10-CM

## 2017-10-21 MED ORDER — IOPAMIDOL (ISOVUE-300) INJECTION 61%
100.0000 mL | Freq: Once | INTRAVENOUS | Status: AC | PRN
  Administered 2017-10-21: 100 mL via INTRAVENOUS

## 2017-10-22 ENCOUNTER — Encounter: Payer: Self-pay | Admitting: Physician Assistant

## 2017-10-22 ENCOUNTER — Other Ambulatory Visit: Payer: Self-pay

## 2017-10-22 MED ORDER — NA SULFATE-K SULFATE-MG SULF 17.5-3.13-1.6 GM/177ML PO SOLN: 1.0000 | Freq: Once | ORAL | 0 refills | Status: AC

## 2017-10-23 ENCOUNTER — Encounter: Payer: Self-pay | Admitting: Family Medicine

## 2017-10-23 ENCOUNTER — Encounter: Payer: Self-pay | Admitting: Internal Medicine

## 2017-10-28 ENCOUNTER — Encounter: Payer: Self-pay | Admitting: Internal Medicine

## 2017-10-28 ENCOUNTER — Ambulatory Visit (INDEPENDENT_AMBULATORY_CARE_PROVIDER_SITE_OTHER): Payer: BLUE CROSS/BLUE SHIELD | Admitting: Internal Medicine

## 2017-10-28 ENCOUNTER — Telehealth: Payer: Self-pay

## 2017-10-28 DIAGNOSIS — Z5189 Encounter for other specified aftercare: Secondary | ICD-10-CM | POA: Diagnosis not present

## 2017-10-28 DIAGNOSIS — K5720 Diverticulitis of large intestine with perforation and abscess without bleeding: Secondary | ICD-10-CM | POA: Diagnosis not present

## 2017-10-28 NOTE — Telephone Encounter (Signed)
Per Dr. Megan Salon called Coretta at [facility] to inform her to continue Invanz until after 11/12/2017. Order repeated and confirmed. Colbert Coyer

## 2017-10-28 NOTE — Assessment & Plan Note (Signed)
She is improving on empiric IV antibiotic therapy for sigmoid diverticulitis complicated by a small, intramural, bladder wall abscess. Her CT scan shows reduction in the size of the abscess. I discussed options with her and we are both in agreement with extending IV antibiotic therapy for at least 2 more weeks. So far she has been able to tolerate being off of golimumab for her rheumatoid arthritis. I would prefer for her to remain off of this until we get more assurance that the abscesses cured. She will follow-up here on 11/12/2017.

## 2017-10-28 NOTE — Progress Notes (Signed)
[facility]  Patient Active Problem List   Diagnosis Date Noted  . Abscess of sigmoid colon due to diverticulitis 10/08/2017    Priority: High  . Immunosuppression due to drug therapy for Rheumatoid Arthritis 10/08/2017    Priority: Medium  . Thrombocytopenia ([place]) 11/23/2013  . Fatty liver 11/24/2010  . OVERWEIGHT 10/31/2008  . Monocytosis 10/31/2008  . HEARING LOSS 10/31/2008  . COLONIC POLYPS 10/20/2008  . Essential hypertension 10/20/2008  . DIVERTICULOSIS OF COLON 10/20/2008  . Hyperlipemia 04/23/2008  . VENOUS INSUFFICIENCY 04/23/2008  . ALLERGIC RHINITIS 04/23/2008  . GERD 04/23/2008      Medication List        Accurate as of 10/28/17  4:18 PM. Always use your most recent med list.          acetaminophen 500 MG tablet Commonly known as:  TYLENOL   aspirin 81 MG tablet   calcium carbonate 600 MG Tabs tablet Commonly known as:  OS-CAL   ertapenem IVPB Commonly known as:  INVANZ Inject 1 g daily for 15 doses into the vein. Indication:  Diverticular abscess Last Day of Therapy:  10/28/2017 Labs - Once weekly:  CBC/D and BMP, Labs - Every other week:  ESR and CRP   fenofibrate 160 MG tablet Take 1 tablet (160 mg total) by mouth daily.   fexofenadine 180 MG tablet Commonly known as:  ALLEGRA   hydrochlorothiazide 25 MG tablet Commonly known as:  HYDRODIURIL   hydroxychloroquine 200 MG tablet Commonly known as:  PLAQUENIL   hyoscyamine 0.125 MG SL tablet Commonly known as:  LEVSIN SL Take 1 tablet (0.125 mg total) by mouth every 6 (six) hours as needed.   multivitamin tablet   ondansetron 4 MG tablet Commonly known as:  ZOFRAN Take 1 tablet (4 mg total) every 6 (six) hours as needed by mouth for nausea.   polyethylene glycol packet Commonly known as:  MIRALAX / GLYCOLAX   predniSONE 5 MG tablet Commonly known as:  DELTASONE   PROBIOTIC PO   simvastatin 40 MG tablet Commonly known as:  ZOCOR Take 1  tablet (40 mg total) by mouth at bedtime.   traMADol 50 MG tablet Commonly known as:  ULTRAM Take 1 tablet (50 mg total) every 6 (six) hours as needed by mouth for severe pain.       Subjective: Morgan [name] is in with her husband for her hospital follow-up visit. She hasrheumatoid arthritis and a history of colonic diverticulosis. She had one bout of diverticulitis many years ago. About one month ago she developed left lower quadrant pain and some urinary urgency. She was started on antibiotics for possible urinary tract infection on 09/13/2017 but had persistent pain and was admitted to [facility] on 09/15/2017 and found to have sigmoid diverticulitis complicated by a small abscess adherent to the bladder. There was no evidence of colovesical fistula. She was treated with IV antibiotics and then transition to oral cefdinir and metronidazole. She received a total of 10 days of antibiotic therapy completing treatment on 09/25/2017. She saw Dr. Scarlette Shorts, her gastroenterologist on 09/30/2017 and was scheduled for a colonoscopy in December. Shortly after that she started having increasing abdominal pain and was readmitted on 10/07/2017. Repeat CT scan showed a 1 x 3 cm abscess between the small bowel and bladder. Again, no fistula was noted. She was started on piperacillin tazobactam nd improved. She was discharged on IV ertapenem. She has now completed 3  weeks of IV antibiotic therapy. She has had no problems tolerating her ertapenem or midline catheter. Her abdominal pain has decreased significantly. It was 8 out of 10 on admission and is now only 2 out of10. Her pain is not there all the time. She has mild nausea but no vomiting.  She has been on long-term prednisone for her rheumatoid arthritis. She also gets infusions of golimumab (Simponi) every 8 weeks. She was started on Plaquenil several months ago. Her rheumatoid arthritis has been under good control recently. Because of her recent  diverticulitis she skipped her last infusion of golimumab in late October. She is having a little bit more aching in her joints but this has been tolerable so far.     Review of Systems: Review of Systems  Constitutional: Negative for chills, diaphoresis, fever, malaise/fatigue and weight loss.  HENT: Negative for sore throat.   Respiratory: Negative for cough, sputum production and shortness of breath.   Cardiovascular: Negative for chest pain.  Gastrointestinal: Positive for abdominal pain and nausea. Negative for diarrhea, heartburn and vomiting.  Genitourinary: Negative for dysuria and frequency.  Musculoskeletal: Positive for joint pain. Negative for myalgias.  Skin: Negative for rash.  Neurological: Negative for dizziness and headaches.    Past Medical History:  Diagnosis Date  . Abnormal chest x-ray   . Abnormal electrocardiogram   . Allergic rhinitis   . Colon polyp    polypoid colorectal mucosa/adenomatous  . Contact dermatitis due to poison ivy   . Cystitis   . Diverticulosis of colon   . Fatty liver disease, nonalcoholic   . Gallbladder polyp   . GERD (gastroesophageal reflux disease)   . Hearing loss   . Hemorrhoids   . Hyperlipidemia   . Lymphocytosis   . Overweight(278.02)   . RA (rheumatoid arthritis) ([place])   . Venous insufficiency     Social History   Tobacco Use  . Smoking status: Former Smoker    Packs/day: 0.50    Years: 15.00    Pack years: 7.50    Types: Cigarettes    Last attempt to quit: 12/03/2001    Years since quitting: 15.9  . Smokeless tobacco: Never Used  Substance Use Topics  . Alcohol use: Yes    Comment: occasional  . Drug use: No    Family History  Problem Relation Age of Onset  . Hyperlipidemia Mother   . Hypertension Mother   . Heart disease Sister        sister #1  . Breast cancer Other        half-sister  . Liver disease Sister        sister #1 - hx of liver transplant 14+ yrs ago  . Healthy Sister        sister  #2  . Diabetes Maternal Uncle   . Heart failure Brother   . Diabetes Maternal Aunt        x 2  . Heart failure Son   . Colon cancer Neg Hx     Allergies  Allergen Reactions  . Remicade [Infliximab] Swelling and Other (See Comments)    Facial swelling, throat was closing up.     Objective: Vitals:   10/28/17 1534  BP: 137/82  Pulse: 83  Temp: 98.7 F (37.1 C)  TempSrc: Oral  Weight: 185 lb (83.9 kg)  Height: '5\' 4"'$  (1.626 m)   Body mass index is 31.76 kg/m.  Physical Exam  Constitutional: She is oriented to person, place, and time.  She is in good spirits.  HENT:  Mouth/Throat: No oropharyngeal exudate.  Eyes: Conjunctivae are normal.  Cardiovascular: Normal rate and regular rhythm.  No murmur heard. Pulmonary/Chest: Effort normal and breath sounds normal.  Abdominal: Soft. She exhibits no distension and no mass. There is no tenderness.  Neurological: She is alert and oriented to person, place, and time.  Skin: No rash noted.  Psychiatric: Mood and affect normal.    CT scan of abdomen and pelvis with contrast 10/21/2017  IMPRESSION: 1.5 cm intramural abscess along the bladder dome and vaginal cuff, decreased.  Improving wall thickening involving the sigmoid colon.  Additional ancillary findings as above.   Electronically Signed   By: Julian Hy M.D.   On: 10/21/2017 15:04    Problem List Items Addressed This Visit      High   Abscess of sigmoid colon due to diverticulitis    She is improving on empiric IV antibiotic therapy for sigmoid diverticulitis complicated by a small, intramural, bladder wall abscess. Her CT scan shows reduction in the size of the abscess. I discussed options with her and we are both in agreement with extending IV antibiotic therapy for at least 2 more weeks. So far she has been able to tolerate being off of golimumab for her rheumatoid arthritis. I would prefer for her to remain off of this until we get more assurance  that the abscesses cured. She will follow-up here on 11/12/2017.          Michel Bickers, MD [facility] [phone] pager   [phone] cell 10/28/2017, 4:18 PM

## 2017-10-29 DIAGNOSIS — M0579 Rheumatoid arthritis with rheumatoid factor of multiple sites without organ or systems involvement: Secondary | ICD-10-CM | POA: Diagnosis not present

## 2017-10-29 DIAGNOSIS — K5780 Diverticulitis of intestine, part unspecified, with perforation and abscess without bleeding: Secondary | ICD-10-CM | POA: Diagnosis not present

## 2017-10-29 DIAGNOSIS — D899 Disorder involving the immune mechanism, unspecified: Secondary | ICD-10-CM | POA: Diagnosis not present

## 2017-10-29 DIAGNOSIS — I10 Essential (primary) hypertension: Secondary | ICD-10-CM | POA: Diagnosis not present

## 2017-10-29 DIAGNOSIS — K5720 Diverticulitis of large intestine with perforation and abscess without bleeding: Secondary | ICD-10-CM | POA: Diagnosis not present

## 2017-10-31 ENCOUNTER — Ambulatory Visit: Payer: BLUE CROSS/BLUE SHIELD | Admitting: Family Medicine

## 2017-10-31 ENCOUNTER — Other Ambulatory Visit: Payer: Self-pay | Admitting: Pharmacist

## 2017-10-31 ENCOUNTER — Encounter: Payer: Self-pay | Admitting: Family Medicine

## 2017-10-31 VITALS — BP 128/86 | HR 74 | Temp 97.8°F | Ht 64.0 in | Wt 187.0 lb

## 2017-10-31 DIAGNOSIS — D899 Disorder involving the immune mechanism, unspecified: Secondary | ICD-10-CM

## 2017-10-31 DIAGNOSIS — I10 Essential (primary) hypertension: Secondary | ICD-10-CM

## 2017-10-31 DIAGNOSIS — D849 Immunodeficiency, unspecified: Secondary | ICD-10-CM

## 2017-10-31 DIAGNOSIS — M0579 Rheumatoid arthritis with rheumatoid factor of multiple sites without organ or systems involvement: Secondary | ICD-10-CM | POA: Insufficient documentation

## 2017-10-31 DIAGNOSIS — K5720 Diverticulitis of large intestine with perforation and abscess without bleeding: Secondary | ICD-10-CM

## 2017-10-31 NOTE — Progress Notes (Signed)
Subjective  CC:  Chief Complaint  Patient presents with  . Establish Care    Patient is here today to establish care. She is quite emotional today as she has been sick and is tired.  She understands that it is a slow process but is glad to have found Dr. Jonni Sanger again.    HPI: Morgan [name] is a 66 y.o. female who presents to [facility] today to establish care with me as a new patient.  She has the following concerns or needs:   66 year old female on chronic immunosuppressants due to rheumatoid arthritis recovering from a sigmoid diverticular abscess.  I reviewed hospital course notes, labs and recent consult visits from ID.  Fortunately she is recovering well.  She continues to be somewhat fatigued and had mild lower abdominal pain but no longer having fevers significant abdominal pain nausea vomiting.  She has a PICC line in her right upper arm and is on antibiotics for another 2 weeks.  After that the plan is tentatively to resect her colon.  She remains happy that she is doing so well however the illness has taken a toll on her.  She has a very large family and a good support system.  She remains on chronic prednisone to manage her rheumatoid arthritis.  Her rheumatologist is closely involved with ID at this time.  Her other chronic medical problems are well controlled.  We updated and reviewed the patient's past history in detail and it is documented below.  Problem  Rheumatoid Arthritis Involving Multiple Sites With Positive Rheumatoid Factor (Hcc)   Overview:  ICD-10 cut over    Immunosuppressed Status (Hcc)   Overview:  Humira for RA; frequent pred; no LIVE vaccines   Obesity With Body Mass Index 30 Or Greater  Current Chronic Use of Systemic Steroids  Uses Hearing Aid  Leukocytosis   Overview:  Reactive from RA but undergoing eval by heme. Dr. Alen Blew.   Fatty Liver Disease, Nonalcoholic   Qualifier: Diagnosis of  By: Lenna Gilford MD, [name]    Benign Colon Polyp   Qualifier: Diagnosis of  By: Lenna Gilford MD, [name]   Overview:  [name]. q 10 years.    Essential Hypertension   Qualifier: Diagnosis of  By: Lenna Gilford MD, [name]    Diverticulosis of Colon   Qualifier: Diagnosis of  By: Lenna Gilford MD, [name]   Overview:  Overview:  Qualifier: Diagnosis of  By: Lenna Gilford MD, [name]   Mixed Hyperlipidemia   Qualifier: Diagnosis of  By: Ronnald Ramp CNA/MA, Jessica     Allergic Rhinitis   Qualifier: Diagnosis of  By: Ronnald Ramp CNA/MA, Janett Billow    Overview:  Overview:  Qualifier: Diagnosis of  By: Ronnald Ramp CNA/MA, Jessica    Gastroesophageal Reflux Disease Without Esophagitis   Qualifier: Diagnosis of  By: Ronnald Ramp CNA/MA, Jessica     Diverticulitis (Resolved)  Thrombocytopenia (Hcc) (Resolved)  Overweight(278.02) (Resolved)   Qualifier: Diagnosis of  By: Lenna Gilford MD, [name]    Monocytosis (Resolved)   Qualifier: Diagnosis of  By: Lenna Gilford MD, [place] Maintenance  Topic Date Due  . Samul Dada  06/02/2017  . DEXA SCAN  12/27/2017  . MAMMOGRAM  02/14/2019  . COLONOSCOPY  08/28/2019  . INFLUENZA VACCINE  Completed  . Hepatitis C Screening  Completed  . PNA vac Low Risk Adult  Completed   Immunization History  Administered Date(s) Administered  . Influenza Split 01/09/2012, 08/25/2012, 09/30/2013  .  Influenza Whole 12/12/2009  . Influenza, High Dose Seasonal PF 09/03/2016, 09/19/2017  . Pneumococcal Conjugate-13 08/27/2014  . Pneumococcal Polysaccharide-23 12/09/2015   Current Meds  Medication Sig  . acetaminophen (TYLENOL) 500 MG tablet Take 500 mg by mouth every 6 (six) hours as needed.  Marland Kitchen aspirin 81 MG tablet Take 81 mg by mouth daily. Takes 3 times weekly due to bruising  . calcium carbonate (OS-CAL) 600 MG TABS Take 2 tablets by mouth daily.    . fenofibrate 160 MG tablet Take 1 tablet (160 mg total) by mouth daily.  . fexofenadine (ALLEGRA) 180 MG tablet Take 180 mg by mouth daily.  .  hydrochlorothiazide (HYDRODIURIL) 25 MG tablet Take 25 mg by mouth daily.  . hydroxychloroquine (PLAQUENIL) 200 MG tablet Take 400 mg by mouth daily.  . Multiple Vitamin (MULTIVITAMIN) tablet Take 1 tablet by mouth daily.    . polyethylene glycol (MIRALAX / GLYCOLAX) packet Take 1 packet daily as needed by mouth.  . predniSONE (DELTASONE) 5 MG tablet Take 5 mg by mouth daily.  . Probiotic Product (PROBIOTIC PO) Take by mouth daily.  . simvastatin (ZOCOR) 40 MG tablet Take 1 tablet (40 mg total) by mouth at bedtime.   Allergies: Patient  reports that she drinks alcohol. Past Medical History Patient  has a past medical history of Abnormal chest x-ray, Abnormal electrocardiogram, Allergic rhinitis, Colon polyp, Contact dermatitis due to poison ivy, Cystitis, Diverticulosis of colon, Fatty liver disease, nonalcoholic, Gallbladder polyp, GERD (gastroesophageal reflux disease), Hearing loss, Hemorrhoids, Hyperlipidemia, Lymphocytosis, Overweight(278.02), RA (rheumatoid arthritis) ([place]), and Venous insufficiency. Past Surgical History Patient  has a past surgical history that includes Total abdominal hysterectomy (12/86); Inner ear surgery (Right); Nasal septum surgery; Foot surgery; chemical and laser endovenous ablation of LE veins (2008, 2009, 2010, 2011); and Cesarean section. Social History   Socioeconomic History  . Marital status: Married    Spouse name: [name]  . Number of children: 4  . Years of education: None  . Highest education level: None  Social Needs  . Financial resource strain: None  . Food insecurity - worry: None  . Food insecurity - inability: None  . Transportation needs - medical: None  . Transportation needs - non-medical: None  Occupational History  . Occupation: Community education officer: [employer]  Tobacco Use  . Smoking status: Former Smoker    Packs/day: 0.50    Years: 15.00    Pack years: 7.50    Types: Cigarettes    Last attempt to quit: 12/03/2001     Years since quitting: 15.9  . Smokeless tobacco: Never Used  Substance and Sexual Activity  . Alcohol use: Yes    Comment: occasional  . Drug use: No  . Sexual activity: Yes  Other Topics Concern  . None  Social History Narrative   Married to [place], with children and grandchildren. 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 Hyperlipidemia Mother   . Hypertension Mother   . COPD Mother   . Arthritis Mother   . Heart disease Mother   . Atrial fibrillation Mother   . Diabetes Sister   . Healthy Sister   . Heart disease Sister        sister #1  . Breast cancer Other        half-sister  . Liver disease Sister        sister #1 - hx  of liver transplant 14+ yrs ago  . Diabetes Maternal Uncle   . Heart failure Brother   . Diabetes Maternal Aunt        x 2  . Heart failure Son   . Colon cancer Neg Hx     Review of Systems: Constitutional: negative for fever or malaise Cardiovascular: negative for chest pain Respiratory: negative for SOB or persistent cough Gastrointestinal: negative for abdominal pain Genitourinary: negative for dysuria or gross hematuria Musculoskeletal: negative for new gait disturbance or muscular weakness Integumentary: negative for new or persistent rashes  Patient Care Team    Relationship Specialty Notifications Start End  [name], MD PCP - General Family Medicine  09/01/14   Michael Boston, MD Consulting Physician General Surgery  10/08/17   Irene Shipper, MD Consulting Physician Gastroenterology  10/10/17   Wyatt Portela, MD Consulting Physician Hematology and Oncology  10/10/17     Objective  Vitals: BP 128/86 (BP Location: Left Arm, Patient Position: Sitting, Cuff Size: Normal)   Pulse 74   Temp 97.8 F (36.6 C) (Oral)   Ht 5\' 4"  (1.626 m)   Wt 187 lb (84.8 kg)   SpO2 98%   BMI 32.10 kg/m  General:  Well developed, well nourished, no acute distress  Psych:   Alert and oriented,normal mood and affect HEENT:  Normocephalic, atraumatic, supple neck  Cardiovascular:  RRR without murmur Respiratory:  Good breath sounds bilaterally, CTAB with normal respiratory effort Gastrointestinal: normal bowel sounds, soft, non-tender, no noted masses. No HSM Skin:  Warm, no rashes Neurologic:   Mental status is normal. Gross motor and sensory exams are normal. Normal gait  Assessment  1. Colonic diverticular abscess   2. Immunosuppressed status ([place])   3. Rheumatoid arthritis involving multiple sites with positive rheumatoid factor ([place])   4. Essential hypertension      Plan   Diverticular abscess: Patient is recovering well.  Reassured her.  Discussed symptoms to look for.  Continue follow-up with ID and rheumatology.  Continue prednisone: Watch for fevers or increasing pain  Tolerating pain on prednisone for her rheumatoid arthritis.  If worsens, will follow up with rheumatology.  May need some pain medicines in the interim.  Blood pressures well controlled  Follow up: Return for recheck divertic abscess.   Commons side effects, risks, benefits, and alternatives for medications and treatment plan prescribed today were discussed, and the patient expressed understanding of the given instructions. Patient is instructed to call or message via MyChart if he/she has any questions or concerns regarding our treatment plan. No barriers to understanding were identified. We discussed Red Flag symptoms and signs in detail. Patient expressed understanding regarding what to do in case of urgent or emergency type symptoms.   Medication list was reconciled, printed and provided to the patient in AVS. Patient instructions and summary information was reviewed with the patient as documented in the AVS. This note was prepared with assistance of Dragon voice recognition software. Occasional wrong-word or sound-a-like substitutions may have occurred due to the inherent  limitations of voice recognition software  No orders of the defined types were placed in this encounter.  No orders of the defined types were placed in this encounter.

## 2017-10-31 NOTE — Patient Instructions (Signed)
It was so good seeing you again! Thank you for establishing with my new practice and allowing me to continue caring for you. It means a lot to me.   Please return in 6 weeks for recheck.

## 2017-11-01 ENCOUNTER — Encounter: Payer: Self-pay | Admitting: Physician Assistant

## 2017-11-01 ENCOUNTER — Ambulatory Visit: Payer: BLUE CROSS/BLUE SHIELD | Admitting: Physician Assistant

## 2017-11-01 VITALS — BP 122/62 | HR 63 | Ht 64.0 in | Wt 187.0 lb

## 2017-11-01 DIAGNOSIS — Z86010 Personal history of colonic polyps: Secondary | ICD-10-CM | POA: Diagnosis not present

## 2017-11-01 DIAGNOSIS — K5720 Diverticulitis of large intestine with perforation and abscess without bleeding: Secondary | ICD-10-CM

## 2017-11-01 DIAGNOSIS — Z860101 Personal history of adenomatous and serrated colon polyps: Secondary | ICD-10-CM

## 2017-11-01 NOTE — Progress Notes (Signed)
Subjective:    Patient ID: Morgan [name], female    DOB: 04-Jul-1951, 66 y.o.   MRN: [id number]  HPI [name] is a very nice 66 year old white female, known to [name] from previous colonoscopies.  She has history of adenomatous colon polyps with last colonoscopy done in September 2013 showing no recurrent polyps. Patient has had recent hospitalization for acute complicated sigmoid diverticulitis.  She was initially diagnosed mid October and hospitalized 3 days at a [facility]  facility, treated with IV antibiotics and then discharged home on Omnicef and Flagyl for 1 week.  She required readmission here on 10/07/2017 with progressively severe left lower quadrant pain.  CT of the abdomen and pelvis on admission showed interval progression of inflammatory changes in the pelvis with the sigmoid colon showing ill-defined wall thickening and now being tethered to the bladder which had developed marked asymmetric bladder wall thickening.  There was a rim enhancing fluid collection compatible with an abscess either in the posterior bladder wall or tracking along the posterior bladder wall towards the vaginal cuff was no definite colovesical fistula but could not be excluded, no gas in the bladder.  She was followed by GI and surgery during her admission.  The abscess was not felt to be in good position to be drained per IR.  She was treated with IV antibiotics and CT was repeated on 10/11/2017 showing a 3 x 1 cm abscess between the sigmoid bladder and the vaginal cuff was felt to be stable, there was also a subcentimeter more anterior collection along the bladder containing fluid instead of gas there was cystitis along the dome of the bladder without evidence of transmural fistula. Ultimately patient was seen by infectious disease, and a 14-day course of IV antibiotics was recommended.  She had PICC line placed and was discharged home on ertapenem. She has been seen in follow-up by Dr. Megan Salon earlier this week.   Repeat CT was done on 10/21/2017 showing persistent mild bladder wall thickening along the bladder down and an associated 1.5 x 1.5 x 1.3 cm intramural abscess along the bladder dome and vaginal cuff which appeared to be smaller, there was improving wall thickening involving the sigmoid colon. Plan is now to continue IV ertapenem for another 2 weeks and she will have ID follow-up on 11/12/2017. She comes in here today for post hospital follow-up.  She says she has had some queasiness off and on which she attributes to the antibiotics.  Appetite has been okay.  No fevers chills or sweats.  She denies any dysuria or hematuria. She does have a persistent achy pain in the left lower quadrant which is constant.  She rates this at a 2 or 3 and has not been requiring pain medication. He has been having fairly normal bowel movements. Patient has follow-up with surgery/Dr. Johney Maine next week-and ultimate plan is for segmental resection possibly on December 20. Patient had been scheduled for follow-up colonoscopy with [name] on 11/20/2017. Review of Systems Pertinent positive and negative review of systems were noted in the above HPI section.  All other review of systems was otherwise negative.  Outpatient Encounter Medications as of 11/01/2017  Medication Sig  . acetaminophen (TYLENOL) 500 MG tablet Take 500 mg by mouth every 6 (six) hours as needed.  Marland Kitchen aspirin 81 MG tablet Take 81 mg by mouth daily. Takes 3 times weekly due to bruising  . calcium carbonate (OS-CAL) 600 MG TABS Take 2 tablets by mouth daily.    Marland Kitchen  fenofibrate 160 MG tablet Take 1 tablet (160 mg total) by mouth daily.  . fexofenadine (ALLEGRA) 180 MG tablet Take 180 mg by mouth daily.  . hydrochlorothiazide (HYDRODIURIL) 25 MG tablet Take 25 mg by mouth daily.  . hydroxychloroquine (PLAQUENIL) 200 MG tablet Take 400 mg by mouth daily.  . hyoscyamine (LEVSIN SL) 0.125 MG SL tablet Take 1 tablet (0.125 mg total) by mouth every 6 (six) hours as  needed.  . Multiple Vitamin (MULTIVITAMIN) tablet Take 1 tablet by mouth daily.    . ondansetron (ZOFRAN) 4 MG tablet Take 1 tablet (4 mg total) every 6 (six) hours as needed by mouth for nausea.  . polyethylene glycol (MIRALAX / GLYCOLAX) packet Take 1 packet daily as needed by mouth.  . predniSONE (DELTASONE) 5 MG tablet Take 5 mg by mouth daily.  . Probiotic Product (PROBIOTIC PO) Take by mouth daily.  . simvastatin (ZOCOR) 40 MG tablet Take 1 tablet (40 mg total) by mouth at bedtime. (Patient not taking: Reported on 11/01/2017)  . traMADol (ULTRAM) 50 MG tablet Take 1 tablet (50 mg total) every 6 (six) hours as needed by mouth for severe pain. (Patient not taking: Reported on 11/01/2017)   No facility-administered encounter medications on file as of 11/01/2017.    Allergies  Allergen Reactions  . Remicade [Infliximab] Swelling and Other (See Comments)    Facial swelling, throat was closing up.    Patient Active Problem List   Diagnosis Date Noted  . Rheumatoid arthritis involving multiple sites with positive rheumatoid factor ([place]) 10/31/2017  . Immunosuppressed status ([place]) 10/08/2017  . Obesity with body mass index 30 or greater 01/25/2017  . Current chronic use of systemic steroids 06/08/2016  . Uses hearing aid 12/09/2015  . Leukocytosis 08/27/2014  . Fatty liver disease, nonalcoholic 16/09/9603  . HEARING LOSS 10/31/2008  . Benign colon polyp 10/20/2008  . Essential hypertension 10/20/2008  . Diverticulosis of colon 10/20/2008  . Mixed hyperlipidemia 04/23/2008  . VENOUS INSUFFICIENCY 04/23/2008  . Allergic rhinitis 04/23/2008  . Gastroesophageal reflux disease without esophagitis 04/23/2008   Social History   Socioeconomic History  . Marital status: Married    Spouse name: [name]  . Number of children: 4  . Years of education: Not on file  . Highest education level: Not on file  Social Needs  . Financial resource strain: Not on file  . Food insecurity - worry: Not  on file  . Food insecurity - inability: Not on file  . Transportation needs - medical: Not on file  . Transportation needs - non-medical: Not on file  Occupational History  . Occupation: Community education officer: [employer]  Tobacco Use  . Smoking status: Former Smoker    Packs/day: 0.50    Years: 15.00    Pack years: 7.50    Types: Cigarettes    Last attempt to quit: 12/03/2001    Years since quitting: 15.9  . Smokeless tobacco: Never Used  Substance and Sexual Activity  . Alcohol use: Yes    Comment: occasional  . Drug use: No  . Sexual activity: Yes  Other Topics Concern  . Not on file  Social History Narrative   Married to [place], with children and grandchildren. Large supportive family; banking, no tob or alcohol or drug use    Ms. Patron family history includes Arthritis in her mother; Atrial fibrillation in her mother; Breast cancer in her other; COPD in her mother; Diabetes in her maternal aunt, maternal uncle,  and sister; Healthy in her sister; Heart disease in her mother and sister; Heart failure in her brother and son; Hyperlipidemia in her mother; Hypertension in her mother; Liver disease in her sister.      Objective:    Vitals:   11/01/17 0928  BP: 122/62  Pulse: 63  SpO2: 93%    Physical Exam white female in no acute distress, very pleasant blood pressure 122/62 pulse 63, BMI 32.1.  HEENT; nontraumatic normocephalic EOMI PERRLA sclera anicteric, Cardiovascular; regular rate and rhythm with S1-S2 no murmur rub or gallop, Pulmonary; clear bilaterally, Abdomen; large, soft she remains tender in the left lower quadrant there is no guarding or rebound, bowel sounds are present no palpable mass or hepatosplenomegaly, Rectal ;exam not done, Extremities ;no clubbing cyanosis or edema skin warm and dry, Neuro; psych mood and affect appropriate       Assessment & Plan:   #68 66 year old female with complicated sigmoid diverticulitis with persistent abscess,  intramural along the bladder dome and sigmoid colon, tethering the sigmoid colon to the bladder. Patient has completed 2 weeks of IV home antibiotics with ertapenem, and plan is to complete another 2 weeks of IV antibiotics with ertapenem.  She will then need follow-up CT scan. She continues to have achy left lower quadrant pain which has improved since hospitalization but is still present. #2 history of adenomatous colon polyps-due for follow-up colonoscopy which is scheduled on 11/20/2017.  #3.  Rheumatoid arthritis Patient had been on biologic therapy which has been on hold since she has been ill.  She continues on Plaquenil and 5 mg of prednisone daily.  Thus far she has been doing okay without any significant flare of her arthritis. #4.  Hypertension #5.  GERD  Plan; patient will follow up with surgery/Dr. Johney Maine next week.  Plan had been for tentative surgery on 12/22,018.  This may need to be delayed if her abscess is not completely resolved. She will continue IV antibiotics/ertapenem through 11/12/2017, and will then need to be scheduled for follow-up CT of the abdomen and pelvis. Will need colonoscopy scheduled at this point for 11/20/2017, however patient is aware that if the abscess is not completely resolved and/or if there is any residual diverticulitis that colonoscopy will need to be canceled. Patient knows to call if she has any worsening of abdominal pain or develops fever chills, dysuria or hematuria. I will communicate with Dr. Megan Salon and Dr. Johney Maine to coordinate her care and be sure and follow-up CT .  Branon Sabine S [facility] Neuroth PA-C 11/01/2017   Cc: [name], MD

## 2017-11-01 NOTE — Progress Notes (Signed)
I do not feel that colonoscopy is critically important given a negative examination 5 years ago and what seems like obvious complicated diverticulitis. Timing of surgery will be strictly determined by general surgery.

## 2017-11-01 NOTE — Patient Instructions (Signed)
We will leave the colonoscopy scheduled for 11-20-2017 for now with Dr. Scarlette Shorts. We can cancel or push back the date depending on the next CT scan results.   If you are age 68 or older, your body mass index should be between 23-30. Your Body mass index is 32.1 kg/m. If this is out of the aforementioned range listed, please consider follow up with your Primary Care Provider.

## 2017-11-04 ENCOUNTER — Encounter: Payer: Self-pay | Admitting: Internal Medicine

## 2017-11-04 ENCOUNTER — Ambulatory Visit: Payer: Self-pay | Admitting: Surgery

## 2017-11-04 ENCOUNTER — Telehealth: Payer: Self-pay

## 2017-11-04 ENCOUNTER — Encounter: Payer: Self-pay | Admitting: Physician Assistant

## 2017-11-04 ENCOUNTER — Other Ambulatory Visit: Payer: Self-pay | Admitting: Internal Medicine

## 2017-11-04 DIAGNOSIS — K5720 Diverticulitis of large intestine with perforation and abscess without bleeding: Secondary | ICD-10-CM

## 2017-11-04 DIAGNOSIS — Z452 Encounter for adjustment and management of vascular access device: Secondary | ICD-10-CM | POA: Diagnosis not present

## 2017-11-04 DIAGNOSIS — Z01818 Encounter for other preprocedural examination: Secondary | ICD-10-CM | POA: Diagnosis not present

## 2017-11-04 DIAGNOSIS — Z79899 Other long term (current) drug therapy: Secondary | ICD-10-CM | POA: Diagnosis not present

## 2017-11-04 DIAGNOSIS — Z792 Long term (current) use of antibiotics: Secondary | ICD-10-CM | POA: Diagnosis not present

## 2017-11-04 HISTORY — DX: Diverticulitis of large intestine with perforation and abscess without bleeding: K57.20

## 2017-11-04 NOTE — Telephone Encounter (Signed)
Per verbal order by Dr. Megan Salon scheduled an appt for ct of the Abdomen Pelvis with Contrast at [place] the appt is 11/13/17 at 9 the pt is to arrive 15 mins before the appt to get checked- in. She can pick up her oral dye anytime before the appt at [facility].  Pt is aware of this appt and confirmed my message. States she already has the dye from a previous appt.  [place]

## 2017-11-05 ENCOUNTER — Encounter: Payer: Self-pay | Admitting: Internal Medicine

## 2017-11-07 ENCOUNTER — Other Ambulatory Visit: Payer: Self-pay | Admitting: Pharmacist

## 2017-11-11 ENCOUNTER — Encounter: Payer: Self-pay | Admitting: Internal Medicine

## 2017-11-12 ENCOUNTER — Ambulatory Visit: Payer: BLUE CROSS/BLUE SHIELD | Admitting: Internal Medicine

## 2017-11-12 ENCOUNTER — Encounter: Payer: Self-pay | Admitting: Internal Medicine

## 2017-11-12 ENCOUNTER — Telehealth: Payer: Self-pay | Admitting: *Deleted

## 2017-11-12 DIAGNOSIS — Z452 Encounter for adjustment and management of vascular access device: Secondary | ICD-10-CM | POA: Diagnosis not present

## 2017-11-12 DIAGNOSIS — Z79899 Other long term (current) drug therapy: Secondary | ICD-10-CM | POA: Diagnosis not present

## 2017-11-12 DIAGNOSIS — Z792 Long term (current) use of antibiotics: Secondary | ICD-10-CM | POA: Diagnosis not present

## 2017-11-12 DIAGNOSIS — K5720 Diverticulitis of large intestine with perforation and abscess without bleeding: Secondary | ICD-10-CM | POA: Diagnosis not present

## 2017-11-12 NOTE — Telephone Encounter (Signed)
[facility] calling for orders regarding patient's PICC and IV antibiotics. Patient completed antibiotics today, has CT scheduled for tomorrow and [facility] follow up 12/13. Per verbal order from Dr Megan Salon, RN gave the order to continue IV antibiotics through follow up appointment 12/13. Order repeated and verified by Coretta. Landis Gandy, RN

## 2017-11-13 ENCOUNTER — Ambulatory Visit
Admission: RE | Admit: 2017-11-13 | Discharge: 2017-11-13 | Disposition: A | Discharge status: Home / Self Care | Payer: BLUE CROSS/BLUE SHIELD | Source: Ambulatory Visit | Attending: Internal Medicine | Admitting: Internal Medicine

## 2017-11-13 DIAGNOSIS — K5780 Diverticulitis of intestine, part unspecified, with perforation and abscess without bleeding: Secondary | ICD-10-CM | POA: Diagnosis not present

## 2017-11-13 DIAGNOSIS — K5720 Diverticulitis of large intestine with perforation and abscess without bleeding: Secondary | ICD-10-CM

## 2017-11-13 MED ORDER — IOPAMIDOL (ISOVUE-300) INJECTION 61%
100.0000 mL | Freq: Once | INTRAVENOUS | Status: AC | PRN
  Administered 2017-11-13: 100 mL via INTRAVENOUS

## 2017-11-14 ENCOUNTER — Telehealth: Payer: Self-pay | Admitting: Pharmacist

## 2017-11-14 ENCOUNTER — Other Ambulatory Visit: Payer: Self-pay | Admitting: Pharmacist

## 2017-11-14 ENCOUNTER — Ambulatory Visit (INDEPENDENT_AMBULATORY_CARE_PROVIDER_SITE_OTHER): Payer: BLUE CROSS/BLUE SHIELD | Admitting: Internal Medicine

## 2017-11-14 ENCOUNTER — Encounter: Payer: Self-pay | Admitting: Internal Medicine

## 2017-11-14 DIAGNOSIS — K5720 Diverticulitis of large intestine with perforation and abscess without bleeding: Secondary | ICD-10-CM

## 2017-11-14 NOTE — Progress Notes (Signed)
[facility] for Infectious Disease  Patient Active Problem List   Diagnosis Date Noted  . Abscess of sigmoid colon due to diverticulitis 11/04/2017    Priority: High  . Immunosuppressed status ([place]) 10/08/2017    Priority: Medium  . Rheumatoid arthritis involving multiple sites with positive rheumatoid factor ([place]) 10/31/2017  . Obesity with body mass index 30 or greater 01/25/2017  . Current chronic use of systemic steroids 06/08/2016  . Uses hearing aid 12/09/2015  . Leukocytosis 08/27/2014  . Fatty liver disease, nonalcoholic 40/09/2724  . HEARING LOSS 10/31/2008  . Benign colon polyp 10/20/2008  . Essential hypertension 10/20/2008  . Diverticulosis of colon 10/20/2008  . Mixed hyperlipidemia 04/23/2008  . VENOUS INSUFFICIENCY 04/23/2008  . Allergic rhinitis 04/23/2008  . Gastroesophageal reflux disease without esophagitis 04/23/2008      Medication List        Accurate as of 11/14/17  2:12 PM. Always use your most recent med list.          acetaminophen 500 MG tablet Commonly known as:  TYLENOL   aspirin 81 MG tablet   calcium carbonate 600 MG Tabs tablet Commonly known as:  OS-CAL   ertapenem IVPB Commonly known as:  INVANZ   fenofibrate 160 MG tablet Take 1 tablet (160 mg total) by mouth daily.   fexofenadine 180 MG tablet Commonly known as:  ALLEGRA   hydrochlorothiazide 25 MG tablet Commonly known as:  HYDRODIURIL   hydroxychloroquine 200 MG tablet Commonly known as:  PLAQUENIL   hyoscyamine 0.125 MG SL tablet Commonly known as:  LEVSIN SL Take 1 tablet (0.125 mg total) by mouth every 6 (six) hours as needed.   multivitamin tablet   ondansetron 4 MG tablet Commonly known as:  ZOFRAN Take 1 tablet (4 mg total) every 6 (six) hours as needed by mouth for nausea.   polyethylene glycol packet Commonly known as:  MIRALAX / GLYCOLAX   predniSONE 5 MG tablet Commonly known as:  DELTASONE   PROBIOTIC PO   simvastatin 40 MG  tablet Commonly known as:  ZOCOR Take 1 tablet (40 mg total) by mouth at bedtime.   traMADol 50 MG tablet Commonly known as:  ULTRAM Take 1 tablet (50 mg total) every 6 (six) hours as needed by mouth for severe pain.       Subjective: Morgan [name] is in with her husband for her routine follow-up visit. She has now completed 38 days of IV ertapenem for her diverticular abscess. She is feeling much better. He has only occasional low abdominal pain. It is now very mild. Her appetite has improved. She is not having any nausea or vomiting. Her nurses are unable to draw blood from her midline catheter. Her infusion times have been increasing and it now takes over one hour to infuse her ertapenem each day.  Review of Systems: Review of Systems  Constitutional: Negative for chills, diaphoresis, fever and weight loss.  Gastrointestinal: Positive for abdominal pain. Negative for diarrhea, nausea and vomiting.  Skin: Negative for rash.    Past Medical History:  Diagnosis Date  . Abnormal chest x-ray   . Abnormal electrocardiogram   . Allergic rhinitis   . Colon polyp    polypoid colorectal mucosa/adenomatous  . Contact dermatitis due to poison ivy   . Cystitis   . Diverticulosis of colon   . Fatty liver disease, nonalcoholic   . Gallbladder polyp   . GERD (gastroesophageal reflux disease)   . Hearing  loss   . Hemorrhoids   . Hyperlipidemia   . Lymphocytosis   . Overweight(278.02)   . RA (rheumatoid arthritis) ([place])   . Venous insufficiency     Social History   Tobacco Use  . Smoking status: Former Smoker    Packs/day: 0.50    Years: 15.00    Pack years: 7.50    Types: Cigarettes    Last attempt to quit: 12/03/2001    Years since quitting: 15.9  . Smokeless tobacco: Never Used  Substance Use Topics  . Alcohol use: Yes    Comment: occasional  . Drug use: No    Family History  Problem Relation Age of Onset  . Hyperlipidemia Mother   . Hypertension Mother   . COPD Mother   .  Arthritis Mother   . Heart disease Mother   . Atrial fibrillation Mother   . Diabetes Sister   . Healthy Sister   . Heart disease Sister        sister #1  . Breast cancer Other        half-sister  . Liver disease Sister        sister #1 - hx of liver transplant 14+ yrs ago  . Diabetes Maternal Uncle   . Heart failure Brother   . Diabetes Maternal Aunt        x 2  . Heart failure Son   . Colon cancer Neg Hx     Allergies  Allergen Reactions  . Remicade [Infliximab] Swelling and Other (See Comments)    Facial swelling, throat was closing up.     Objective: Vitals:   11/14/17 0926  BP: 128/81  Pulse: 74  Temp: 98.1 F (36.7 C)  TempSrc: Oral  Weight: 187 lb (84.8 kg)   Body mass index is 32.1 kg/m.  Physical Exam  Constitutional: She is oriented to person, place, and time.  She is in good spirits.  Abdominal: Soft. She exhibits no distension and no mass. There is no tenderness.  Neurological: She is alert and oriented to person, place, and time.  Skin: No rash noted.  Her right arm midline site looks good.  Psychiatric: Mood and affect normal.    CT abdomen and pelvis with contrast 11/13/2017  IMPRESSION: Resolution of previously seen fluid collection within the vaginal cuff.  Area of bladder wall thickening anteriorly and superiorly best visualized on the coronal imaging. A small fluid collection is noted within the bladder wall better organized than on the prior exam measuring 13 mm. Continued follow-up is recommended. No air is noted within the bladder to suggest persistent fistula.  Persistent diverticular change of the sigmoid colon with wall thickening.   Electronically Signed   By: Inez Catalina M.D.   On: 11/13/2017 09:50    Problem List Items Addressed This Visit      High   Abscess of sigmoid colon due to diverticulitis    She continues to have clinical and radiographic improvement. Repeat CT scan yesterday showed resolution of the  fluid collection within the vaginal cuff. There remains a very small, residual 13 mm fluid collection within the bladder wall. I discussed the situation with her general surgeon, Dr. Neysa Bonito, and he is in agreement with stopping her ertapenem now and having her midline catheter removed. Ms. Licea is also in agreement with this plan. She will follow-up here in 4 weeks.          Michel Bickers, MD [facility] [phone] pager   [phone] cell 11/14/2017, 2:12 PM

## 2017-11-14 NOTE — Assessment & Plan Note (Addendum)
She continues to have clinical and radiographic improvement. Repeat CT scan yesterday showed resolution of the fluid collection within the vaginal cuff. There remains a very small, residual 13 mm fluid collection within the bladder wall. I discussed the situation with her general surgeon, Dr. Neysa Bonito, and he is in agreement with stopping her ertapenem now and having her midline catheter removed. Morgan [name] is also in agreement with this plan. She will follow-up here in 4 weeks.

## 2017-11-14 NOTE — Telephone Encounter (Signed)
Called and spoke to [place] at [facility] and gave verbal order per Dr. Megan Salon to stop patient's Ertapenem and pull her midline catheter. Debbie verbalized understanding.

## 2017-11-15 ENCOUNTER — Telehealth: Payer: Self-pay | Admitting: Internal Medicine

## 2017-11-15 ENCOUNTER — Encounter: Payer: Self-pay | Admitting: Internal Medicine

## 2017-11-15 ENCOUNTER — Encounter: Payer: Self-pay | Admitting: Physician Assistant

## 2017-11-15 ENCOUNTER — Encounter: Payer: Self-pay | Admitting: Family Medicine

## 2017-11-15 DIAGNOSIS — K5720 Diverticulitis of large intestine with perforation and abscess without bleeding: Secondary | ICD-10-CM | POA: Diagnosis not present

## 2017-11-15 NOTE — Telephone Encounter (Signed)
Pt upset due to surgery being pushed out to January. Pts pt advice request sent to [name].

## 2017-11-15 NOTE — Telephone Encounter (Signed)
Received a phone call this afternoon from Morgan Eke, Ms. Morgan [name] husband.  He stated that they were both very upset.  He told me that they had been under the impression that she would have colon surgery on 11/21/2017, 1 day after her scheduled colonoscopy.  They found out today that her surgery has been postponed until January.  I told him that I had no knowledge of why the surgery had been changed.  I told him that the timing of the surgery had no bearing on my recommendation to stop her IV ertapenem 2 days ago.  I asked him to talk with her surgeon, Dr. Johney Maine, about this issue.  He agreed with that plan.  I asked him to call me if he had any further questions or concerns.

## 2017-11-20 ENCOUNTER — Other Ambulatory Visit: Payer: Self-pay

## 2017-11-20 ENCOUNTER — Ambulatory Visit (AMBULATORY_SURGERY_CENTER): Payer: BLUE CROSS/BLUE SHIELD | Admitting: Internal Medicine

## 2017-11-20 ENCOUNTER — Encounter: Payer: Self-pay | Admitting: Internal Medicine

## 2017-11-20 VITALS — BP 142/69 | HR 68 | Temp 97.3°F | Resp 15 | Ht 64.0 in | Wt 187.0 lb

## 2017-11-20 DIAGNOSIS — K5720 Diverticulitis of large intestine with perforation and abscess without bleeding: Secondary | ICD-10-CM | POA: Diagnosis not present

## 2017-11-20 DIAGNOSIS — Z86010 Personal history of colonic polyps: Secondary | ICD-10-CM | POA: Diagnosis not present

## 2017-11-20 DIAGNOSIS — K56699 Other intestinal obstruction unspecified as to partial versus complete obstruction: Secondary | ICD-10-CM

## 2017-11-20 DIAGNOSIS — Z860101 Personal history of adenomatous and serrated colon polyps: Secondary | ICD-10-CM

## 2017-11-20 MED ORDER — SODIUM CHLORIDE 0.9 % IV SOLN: 500.0000 mL | Freq: Once | INTRAVENOUS | Status: DC

## 2017-11-20 NOTE — Progress Notes (Signed)
1106 after pt. Dressed was called back to bay and pt. Stated "I am hurting real bad and she showed me the upper mid area in abdomen made [name] aware. Rated pain level of 5. 1111. [name] at bedside to evaluate pt. Informed [name] that it is easing up some. 1112 pt. Sitting up on side of bed and states better".1114 pt. Ambulated to bathroom and husband with her.1121 pt out of bathroom stated "better pain level 3 ,placed pt. In recliner for [name] to evaluate.1130 pt. Went back into bathroom and pt. Stated that she passed more fluids and she feels better. She wants [name] to assess her again and she stated then "I will be ready to go."1151 [name] in to evaluate and pt,told the doctor that she feels better order given to D/C Pt.

## 2017-11-20 NOTE — Patient Instructions (Addendum)
YOU HAD AN ENDOSCOPIC PROCEDURE TODAY AT [facility]:   Refer to the procedure report that was given to you for any specific questions about what was found during the examination.  If the procedure report does not answer your questions, please call your gastroenterologist to clarify.  If you requested that your care partner not be given the details of your procedure findings, then the procedure report has been included in a sealed envelope for you to review at your convenience later.  YOU SHOULD EXPECT: Some feelings of bloating in the abdomen. Passage of more gas than usual.  Walking can help get rid of the air that was put into your GI tract during the procedure and reduce the bloating. If you had a lower endoscopy (such as a colonoscopy or flexible sigmoidoscopy) you may notice spotting of blood in your stool or on the toilet paper. If you underwent a bowel prep for your procedure, you may not have a normal bowel movement for a few days.  Please Note:  You might notice some irritation and congestion in your nose or some drainage.  This is from the oxygen used during your procedure.  There is no need for concern and it should clear up in a day or so.  SYMPTOMS TO REPORT IMMEDIATELY:   Following lower endoscopy (colonoscopy or flexible sigmoidoscopy):  Excessive amounts of blood in the stool  Significant tenderness or worsening of abdominal pains  Swelling of the abdomen that is new, acute  Fever of 100F or higher    For urgent or emergent issues, a gastroenterologist can be reached at any hour by calling [phone].   DIET:  We do recommend a small meal at first, but then you may proceed to your regular diet.  Drink plenty of fluids but you should avoid alcoholic beverages for 24 hours.  ACTIVITY:  You should plan to take it easy for the rest of today and you should NOT DRIVE or use heavy machinery until tomorrow (because of the sedation medicines used during the test).     FOLLOW UP: Our staff will call the number listed on your records the next business day following your procedure to check on you and address any questions or concerns that you may have regarding the information given to you following your procedure. If we do not reach you, we will leave a message.  However, if you are feeling well and you are not experiencing any problems, there is no need to return our call.  We will assume that you have returned to your regular daily activities without incident.  If any biopsies were taken you will be contacted by phone or by letter within the next 1-3 weeks.  Please call us at [phone] if you have not heard about the biopsies in 3 weeks.    SIGNATURES/CONFIDENTIALITY: You and/or your care partner have signed paperwork which will be entered into your electronic medical record.  These signatures attest to the fact that that the information above on your After Visit Summary has been reviewed and is understood.  Full responsibility of the confidentiality of this discharge information lies with you and/or your care-partner.   Resume medications. Repeat procedure in one year.

## 2017-11-20 NOTE — Progress Notes (Signed)
To recovery, report to RN, VSS. 

## 2017-11-20 NOTE — Op Note (Signed)
[facility] Patient Name: Morgan [name] Procedure Date: 11/20/2017 10:09 AM MRN: [id number] Endoscopist: Docia Chuck. Henrene Pastor , MD Age: 66 Referring MD:  Date of Birth: May 03, 1951 Gender: Female Account #: [id number] Procedure:                Colonoscopy to Sigmoid colon Indications:              High risk colon cancer surveillance: Personal                            history of multiple (3 or more) adenomas. Previous                            examinations 2002, 2005, 2011, 2013. Recent                            problems with complicated diverticulitis with                            abscess. Being seen by surgery and infectious                            diseases Medicines:                Monitored Anesthesia Care Procedure:                Pre-Anesthesia Assessment:                           - Prior to the procedure, a History and Physical                            was performed, and patient medications and                            allergies were reviewed. The patient's tolerance of                            previous anesthesia was also reviewed. The risks                            and benefits of the procedure and the sedation                            options and risks were discussed with the patient.                            All questions were answered, and informed consent                            was obtained. Prior Anticoagulants: The patient has                            taken no previous anticoagulant or antiplatelet  agents. ASA Grade Assessment: II - A patient with                            mild systemic disease. After reviewing the risks                            and benefits, the patient was deemed in                            satisfactory condition to undergo the procedure.                           After obtaining informed consent, the colonoscope                            was passed under direct vision. Throughout the                          procedure, the patient's blood pressure, pulse, and                            oxygen saturations were monitored continuously. The                            Colonoscope was introduced through the anus and                            advanced to the the cecum, identified by                            appendiceal orifice and ileocecal valve. The Model                            [id number]-[id number]) scope was introduced                            through the anus with the intention of advancing to                            the sigmoid. The scope was advanced to the sigmoid                            colon before the procedure was aborted Due to                            high-grade stenosis. Medications were given. The                            rectum was photographed. The quality of the bowel                            preparation was excellent. The colonoscopy was  performed without difficulty. The patient tolerated                            the procedure well. The bowel preparation used was                            SUPREP. Scope In: 10:23:20 AM Scope Out: 10:31:18 AM Total Procedure Duration: 0 hours 7 minutes 58 seconds  Findings:                 There was high-grade stenosis in the distal sigmoid                            colon at 20 cm from the anal verge. This was an                            area of diverticular disease. This would not permit                            the passage of either the standard adult scope or                            the pediatric scope. The rectum was normal                            including retroflexion. Complications:            No immediate complications. Estimated blood loss:                            None. Estimated Blood Loss:     Estimated blood loss: none. Impression:               - Incomplete colonoscopy due to high-grade                            diverticular stenosis after recent  problems with                            complicated diverticulitis as previously outlined.                           - No specimens collected. Recommendation:           - Repeat colonoscopy in 1 year for surveillance,                            After completely recovered from anticipated surgery                            Next month                           - Follow-up with general surgery for plans for  segmental colectomy as scheduled                           - Keep Your bowels regular with daily dose of                            MiraLAX if needed.                           - Resume previous diet.                           - Continue present medications. Docia Chuck. Henrene Pastor, MD 11/20/2017 10:43:35 AM This report has been signed electronically.

## 2017-11-21 ENCOUNTER — Telehealth: Payer: Self-pay

## 2017-11-21 ENCOUNTER — Other Ambulatory Visit: Payer: Self-pay

## 2017-11-21 ENCOUNTER — Encounter: Payer: Self-pay | Admitting: Internal Medicine

## 2017-11-21 MED ORDER — POLYETHYLENE GLYCOL 3350 17 GM/SCOOP PO POWD: 1.0000 | Freq: Every day | ORAL | 3 refills | Status: DC

## 2017-11-21 NOTE — Telephone Encounter (Signed)
  Follow up Call-  Call back number 11/20/2017  Post procedure Call Back phone  # [phone]  Permission to leave phone message Yes  Some recent data might be hidden     Patient questions:  Do you have a fever, pain , or abdominal swelling? No. Pain Score  0 *  Have you tolerated food without any problems? Yes.    Have you been able to return to your normal activities? Yes.    Do you have any questions about your discharge instructions: Diet   No. Medications  No. Follow up visit  No.  Do you have questions or concerns about your Care? No.  Actions: * If pain score is 4 or above: No action needed, pain <4.  No problems noted per pt. maw

## 2017-11-25 ENCOUNTER — Telehealth: Payer: BLUE CROSS/BLUE SHIELD | Admitting: Family

## 2017-11-25 ENCOUNTER — Telehealth: Payer: Self-pay | Admitting: Family Medicine

## 2017-11-25 DIAGNOSIS — J019 Acute sinusitis, unspecified: Secondary | ICD-10-CM

## 2017-11-25 MED ORDER — AMOXICILLIN-POT CLAVULANATE 875-125 MG PO TABS: 1.0000 | ORAL_TABLET | Freq: Two times a day (BID) | ORAL | 0 refills | Status: DC

## 2017-11-25 NOTE — Progress Notes (Signed)

## 2017-11-25 NOTE — Telephone Encounter (Signed)
Please ask patient to complete an E-visit for me. thanks

## 2017-11-25 NOTE — Telephone Encounter (Signed)
I spoke to patient on telephone. Pt completed E-visit and was called in augmentin.  3rd day of uri sxs - no fever with sinus pain and headache.   Recommend picking up Rx and holding: start if sxs worsen.  (has been on 6 weeks of IV broad spectrum abx: just stopped 1 week ago).    Morgan [name]: here is the CRM message that I got. thanks

## 2017-11-25 NOTE — Telephone Encounter (Signed)
Copied from [place] [id number]. Topic: General - Other >> Nov 25, 2017  8:34 AM Darl Householder, RMA wrote: Reason for CRM: Patient is requesting a call back and would like something called in due to possible sinus infection

## 2017-11-25 NOTE — Telephone Encounter (Signed)
Spoke to pt, told her Dr. Jonni Sanger would like her to do an e-visit and send to her. Pt verbalized understanding and will do one right away.

## 2017-12-01 ENCOUNTER — Encounter: Payer: Self-pay | Admitting: Family Medicine

## 2017-12-02 NOTE — Patient Instructions (Addendum)
Morgan [name]  12/02/2017   Your procedure is scheduled on: 12-11-17  Report to [facility] Main  Entrance .             Report to admitting at   0630 AM   Call this number if you have problems the morning of surgery [phone]    Remember: ONLY 1 PERSON MAY GO WITH YOU TO SHORT STAY TO GET  READY MORNING OF YOUR SURGERY.   Do not eat food or drink liquids :After Midnight.     Take these medicines the morning of surgery with A SIP OF WATER: plaquenil, allegra, antibiotic                                You may not have any metal on your body including hair pins and              piercings  Do not wear jewelry, make-up, lotions, powders or perfumes, deodorant             Do not wear nail polish.  Do not shave  48 hours prior to surgery.               Do not bring valuables to the hospital. [place].  Contacts, dentures or bridgework may not be worn into surgery.  Leave suitcase in the car. After surgery it may be brought to your room.                Please read over the following fact sheets you were given: _____________________________________________________________________    CLEAR LIQUID DIET  THE DAY OF YOUR BOWEL PREP FOLLOW BOWEL PREP PER MD   Foods Allowed                                                                     Foods Excluded  Coffee and tea, regular and decaf                             liquids that you cannot  Plain Jell-O in any flavor                                             see through such as: Fruit ices (not with fruit pulp)                                     milk, soups, orange juice  Iced Popsicles                                    All solid food Carbonated beverages, regular and diet  Cranberry, grape and apple juices Sports drinks like Gatorade Lightly seasoned clear broth or consume(fat free) Sugar, honey syrup  Sample  Menu Breakfast                                Lunch                                     Supper Cranberry juice                    Beef broth                            Chicken broth Jell-O                                     Grape juice                           Apple juice Coffee or tea                        Jell-O                                      Popsicle                                                Coffee or tea                        Coffee or tea  _____________________________________________________________________            [facility] - Preparing for Surgery Before surgery, you can play an important role.  Because skin is not sterile, your skin needs to be as free of germs as possible.  You can reduce the number of germs on your skin by washing with CHG (chlorahexidine gluconate) soap before surgery.  CHG is an antiseptic cleaner which kills germs and bonds with the skin to continue killing germs even after washing. Please DO NOT use if you have an allergy to CHG or antibacterial soaps.  If your skin becomes reddened/irritated stop using the CHG and inform your nurse when you arrive at Short Stay. Do not shave (including legs and underarms) for at least 48 hours prior to the first CHG shower.  You may shave your face/neck. Please follow these instructions carefully:  1.  Shower with CHG Soap the night before surgery and the  morning of Surgery.  2.  If you choose to wash your hair, wash your hair first as usual with your  normal  shampoo.  3.  After you shampoo, rinse your hair and body thoroughly to remove the  shampoo.                           4.  Use CHG as you would any other liquid soap.  You can apply chg directly  to the skin and wash                       Gently with a scrungie or clean washcloth.  5.  Apply the CHG Soap to your body ONLY FROM THE NECK DOWN.   Do not use on face/ open                           Wound or open sores. Avoid contact with eyes, ears mouth and  genitals (private parts).                       Wash face,  Genitals (private parts) with your normal soap.             6.  Wash thoroughly, paying special attention to the area where your surgery  will be performed.  7.  Thoroughly rinse your body with warm water from the neck down.  8.  DO NOT shower/wash with your normal soap after using and rinsing off  the CHG Soap.                9.  Pat yourself dry with a clean towel.            10.  Wear clean pajamas.            11.  Place clean sheets on your bed the night of your first shower and do not  sleep with pets. Day of Surgery : Do not apply any lotions/deodorants the morning of surgery.  Please wear clean clothes to the hospital/surgery center.  FAILURE TO FOLLOW THESE INSTRUCTIONS MAY RESULT IN THE CANCELLATION OF YOUR SURGERY PATIENT SIGNATURE_________________________________  NURSE SIGNATURE__________________________________  ________________________________________________________________________  WHAT IS A BLOOD TRANSFUSION? Blood Transfusion Information  A transfusion is the replacement of blood or some of its parts. Blood is made up of multiple cells which provide different functions.  Red blood cells carry oxygen and are used for blood loss replacement.  White blood cells fight against infection.  Platelets control bleeding.  Plasma helps clot blood.  Other blood products are available for specialized needs, such as hemophilia or other clotting disorders. BEFORE THE TRANSFUSION  Who gives blood for transfusions?   Healthy volunteers who are fully evaluated to make sure their blood is safe. This is blood bank blood. Transfusion therapy is the safest it has ever been in the practice of medicine. Before blood is taken from a donor, a complete history is taken to make sure that person has no history of diseases nor engages in risky social behavior (examples are intravenous drug use or sexual activity with multiple  partners). The donor's travel history is screened to minimize risk of transmitting infections, such as malaria. The donated blood is tested for signs of infectious diseases, such as HIV and hepatitis. The blood is then tested to be sure it is compatible with you in order to minimize the chance of a transfusion reaction. If you or a relative donates blood, this is often done in anticipation of surgery and is not appropriate for emergency situations. It takes many days to process the donated blood. RISKS AND COMPLICATIONS Although transfusion therapy is very safe and saves many lives, the main dangers of transfusion include:   Getting an infectious disease.  Developing a transfusion reaction. This is an allergic reaction to something in the blood you were given. Every precaution is taken to  prevent this. The decision to have a blood transfusion has been considered carefully by your caregiver before blood is given. Blood is not given unless the benefits outweigh the risks. AFTER THE TRANSFUSION  Right after receiving a blood transfusion, you will usually feel much better and more energetic. This is especially true if your red blood cells have gotten low (anemic). The transfusion raises the level of the red blood cells which carry oxygen, and this usually causes an energy increase.  The nurse administering the transfusion will monitor you carefully for complications. HOME CARE INSTRUCTIONS  No special instructions are needed after a transfusion. You may find your energy is better. Speak with your caregiver about any limitations on activity for underlying diseases you may have. SEEK MEDICAL CARE IF:   Your condition is not improving after your transfusion.  You develop redness or irritation at the intravenous (IV) site. SEEK IMMEDIATE MEDICAL CARE IF:  Any of the following symptoms occur over the next 12 hours:  Shaking chills.  You have a temperature by mouth above 102 F (38.9 C), not  controlled by medicine.  Chest, back, or muscle pain.  People around you feel you are not acting correctly or are confused.  Shortness of breath or difficulty breathing.  Dizziness and fainting.  You get a rash or develop hives.  You have a decrease in urine output.  Your urine turns a dark color or changes to pink, red, or brown. Any of the following symptoms occur over the next 10 days:  You have a temperature by mouth above 102 F (38.9 C), not controlled by medicine.  Shortness of breath.  Weakness after normal activity.  The white part of the eye turns yellow (jaundice).  You have a decrease in the amount of urine or are urinating less often.  Your urine turns a dark color or changes to pink, red, or brown. Document Released: 11/16/2000 Document Revised: 02/11/2012 Document Reviewed: 07/05/2008 ExitCare Patient Information 2014 [place].  _______________________________________________________________________  Incentive Spirometer  An incentive spirometer is a tool that can help keep your lungs clear and active. This tool measures how well you are filling your lungs with each breath. Taking long deep breaths may help reverse or decrease the chance of developing breathing (pulmonary) problems (especially infection) following:  A long period of time when you are unable to move or be active. BEFORE THE PROCEDURE   If the spirometer includes an indicator to show your best effort, your nurse or respiratory therapist will set it to a desired goal.  If possible, sit up straight or lean slightly forward. Try not to slouch.  Hold the incentive spirometer in an upright position. INSTRUCTIONS FOR USE  1. Sit on the edge of your bed if possible, or sit up as far as you can in bed or on a chair. 2. Hold the incentive spirometer in an upright position. 3. Breathe out normally. 4. Place the mouthpiece in your mouth and seal your lips tightly around it. 5. Breathe in  slowly and as deeply as possible, raising the piston or the ball toward the top of the column. 6. Hold your breath for 3-5 seconds or for as long as possible. Allow the piston or ball to fall to the bottom of the column. 7. Remove the mouthpiece from your mouth and breathe out normally. 8. Rest for a few seconds and repeat Steps 1 through 7 at least 10 times every 1-2 hours when you are awake. Take your time and  take a few normal breaths between deep breaths. 9. The spirometer may include an indicator to show your best effort. Use the indicator as a goal to work toward during each repetition. 10. After each set of 10 deep breaths, practice coughing to be sure your lungs are clear. If you have an incision (the cut made at the time of surgery), support your incision when coughing by placing a pillow or rolled up towels firmly against it. Once you are able to get out of bed, walk around indoors and cough well. You may stop using the incentive spirometer when instructed by your caregiver.  RISKS AND COMPLICATIONS  Take your time so you do not get dizzy or light-headed.  If you are in pain, you may need to take or ask for pain medication before doing incentive spirometry. It is harder to take a deep breath if you are having pain. AFTER USE  Rest and breathe slowly and easily.  It can be helpful to keep track of a log of your progress. Your caregiver can provide you with a simple table to help with this. If you are using the spirometer at home, follow these instructions: [place] IF:   You are having difficultly using the spirometer.  You have trouble using the spirometer as often as instructed.  Your pain medication is not giving enough relief while using the spirometer.  You develop fever of 100.5 F (38.1 C) or higher. SEEK IMMEDIATE MEDICAL CARE IF:   You cough up bloody sputum that had not been present before.  You develop fever of 102 F (38.9 C) or greater.  You develop  worsening pain at or near the incision site. MAKE SURE YOU:   Understand these instructions.  Will watch your condition.  Will get help right away if you are not doing well or get worse. Document Released: 04/01/2007 Document Revised: 02/11/2012 Document Reviewed: 06/02/2007 [facility] Patient Information 2014 [place], [place].   ________________________________________________________________________

## 2017-12-06 ENCOUNTER — Encounter (HOSPITAL_COMMUNITY)
Admission: RE | Admit: 2017-12-06 | Discharge: 2017-12-06 | Disposition: A | Discharge status: Home / Self Care | Payer: BLUE CROSS/BLUE SHIELD | Source: Ambulatory Visit | Attending: Surgery | Admitting: Surgery

## 2017-12-06 ENCOUNTER — Encounter (HOSPITAL_COMMUNITY): Payer: Self-pay

## 2017-12-06 ENCOUNTER — Other Ambulatory Visit: Payer: Self-pay

## 2017-12-06 DIAGNOSIS — Z01810 Encounter for preprocedural cardiovascular examination: Secondary | ICD-10-CM | POA: Insufficient documentation

## 2017-12-06 DIAGNOSIS — Z01812 Encounter for preprocedural laboratory examination: Secondary | ICD-10-CM | POA: Diagnosis not present

## 2017-12-06 HISTORY — DX: Pneumonia, unspecified organism: J18.9

## 2017-12-06 HISTORY — DX: Malignant (primary) neoplasm, unspecified: C80.1

## 2017-12-06 HISTORY — DX: Adverse effect of unspecified anesthetic, initial encounter: T41.45XA

## 2017-12-06 HISTORY — DX: Other complications of anesthesia, initial encounter: T88.59XA

## 2017-12-06 HISTORY — DX: Other specified postprocedural states: Z98.890

## 2017-12-06 HISTORY — DX: Nausea with vomiting, unspecified: R11.2

## 2017-12-06 HISTORY — DX: Essential (primary) hypertension: I10

## 2017-12-06 HISTORY — DX: Cardiac arrhythmia, unspecified: I49.9

## 2017-12-06 LAB — BASIC METABOLIC PANEL
Anion gap: 9 (ref 5–15)
BUN: 19 mg/dL (ref 6–20)
CHLORIDE: 99 mmol/L — AB (ref 101–111)
CO2: 29 mmol/L (ref 22–32)
Calcium: 10.4 mg/dL — ABNORMAL HIGH (ref 8.9–10.3)
Creatinine, Ser: 0.91 mg/dL (ref 0.44–1.00)
GFR calc Af Amer: >60 mL/min (ref >60)
GFR calc non Af Amer: >60 mL/min (ref >60)
Glucose, Bld: 91 mg/dL (ref 65–99)
POTASSIUM: 4.5 mmol/L (ref 3.5–5.1)
SODIUM: 137 mmol/L (ref 135–145)

## 2017-12-06 LAB — CBC
HEMATOCRIT: 38 % (ref 36.0–46.0)
HEMOGLOBIN: 12.7 g/dL (ref 12.0–15.0)
MCH: 30 pg (ref 26.0–34.0)
MCHC: 33.4 g/dL (ref 30.0–36.0)
MCV: 89.6 fL (ref 78.0–100.0)
Platelets: 158 10*3/uL (ref 150–400)
RBC: 4.24 MIL/uL (ref 3.87–5.11)
RDW: 14.9 % (ref 11.5–15.5)
WBC: 10.3 10*3/uL (ref 4.0–10.5)

## 2017-12-06 LAB — ABO/RH: ABO/RH(D): O POS

## 2017-12-06 NOTE — Consult Note (Signed)
[facility] Nurse ostomy consult note  [place] [facility] Nurse requested for preoperative stoma site marking by Dr. Johney [place].  Discussed surgical procedure and stoma creation with patient and her spouse.  Explained role of the [facility] nurse team.  Answered patient and husband's questions. Patient is very hopeful that she does not need an ostomy, even temporary.  Examined patient sitting and standing in order to place the marking in the patient's visual field, away from any creases or abdominal contour issues and within the rectus muscle.    Patient has a crease superior to mark and a roll of adipose tissue immediately below the location selected.  Marked for colostomy in the LUQ  6.5cm to the left of the umbilicus and 2cm above the umbilicus.    If an ileostomy is created, the same location on the RUQ would be ideal.  Patient's abdomen cleansed with CHG wipe at site marking, allowed to air dry prior to marking. Covered mark with thin film transparent dressing to preserve mark until date of surgery (12/11/17).  Thank you for inviting me to meet and mark this lovely patient.  [facility] nursing team will not follow, but will remain available to this patient and her family as well as the nursing, surgical and medical teams if an ostomy is created intraoperatively.  Please re-consult if needed.  Maudie Flakes, MSN, RN, [place], [place]  Pager# [phone]

## 2017-12-09 ENCOUNTER — Encounter: Payer: Self-pay | Admitting: Family Medicine

## 2017-12-09 ENCOUNTER — Ambulatory Visit: Payer: BLUE CROSS/BLUE SHIELD | Admitting: Family Medicine

## 2017-12-09 VITALS — BP 138/70 | HR 61 | Temp 98.0°F | Ht 64.0 in | Wt 186.0 lb

## 2017-12-09 DIAGNOSIS — I10 Essential (primary) hypertension: Secondary | ICD-10-CM | POA: Diagnosis not present

## 2017-12-09 DIAGNOSIS — Z7952 Long term (current) use of systemic steroids: Secondary | ICD-10-CM | POA: Diagnosis not present

## 2017-12-09 DIAGNOSIS — Z23 Encounter for immunization: Secondary | ICD-10-CM | POA: Diagnosis not present

## 2017-12-09 DIAGNOSIS — K5720 Diverticulitis of large intestine with perforation and abscess without bleeding: Secondary | ICD-10-CM

## 2017-12-09 DIAGNOSIS — Z0000 Encounter for general adult medical examination without abnormal findings: Secondary | ICD-10-CM

## 2017-12-09 LAB — CBC WITH DIFFERENTIAL/PLATELET
BASOS ABS: 0.1 10*3/uL (ref 0.0–0.1)
Basophils Relative: 0.5 % (ref 0.0–3.0)
EOS ABS: 0.1 10*3/uL (ref 0.0–0.7)
Eosinophils Relative: 0.4 % (ref 0.0–5.0)
HCT: 39 % (ref 36.0–46.0)
HEMOGLOBIN: 12.9 g/dL (ref 12.0–15.0)
LYMPHS PCT: 17.5 % (ref 12.0–46.0)
Lymphs Abs: 2.2 10*3/uL (ref 0.7–4.0)
MCHC: 33 g/dL (ref 30.0–36.0)
MCV: 90.5 fl (ref 78.0–100.0)
MONO ABS: 2.7 10*3/uL — AB (ref 0.1–1.0)
Monocytes Relative: 20.7 % — ABNORMAL HIGH (ref 3.0–12.0)
Neutro Abs: 7.8 10*3/uL — ABNORMAL HIGH (ref 1.4–7.7)
Neutrophils Relative %: 60.9 % (ref 43.0–77.0)
Platelets: 166 10*3/uL (ref 150.0–400.0)
RBC: 4.31 Mil/uL (ref 3.87–5.11)
RDW: 15.6 % — AB (ref 11.5–15.5)
WBC: 12.9 10*3/uL — AB (ref 4.0–10.5)

## 2017-12-09 LAB — LIPID PANEL
Cholesterol: 139 mg/dL (ref 0–200)
HDL: 44.6 mg/dL (ref >39.00)
LDL CALC: 70 mg/dL (ref 0–99)
NonHDL: 94.1
Total CHOL/HDL Ratio: 3
Triglycerides: 121 mg/dL (ref 0.0–149.0)
VLDL: 24.2 mg/dL (ref 0.0–40.0)

## 2017-12-09 LAB — COMPREHENSIVE METABOLIC PANEL
ALBUMIN: 4.2 g/dL (ref 3.5–5.2)
ALT: 18 U/L (ref 0–35)
AST: 23 U/L (ref 0–37)
Alkaline Phosphatase: 58 U/L (ref 39–117)
BILIRUBIN TOTAL: 0.4 mg/dL (ref 0.2–1.2)
BUN: 21 mg/dL (ref 6–23)
CHLORIDE: 99 meq/L (ref 96–112)
CO2: 32 mEq/L (ref 19–32)
CREATININE: 0.84 mg/dL (ref 0.40–1.20)
Calcium: 10.9 mg/dL — ABNORMAL HIGH (ref 8.4–10.5)
GFR: 71.52 mL/min (ref >60.00)
Glucose, Bld: 92 mg/dL (ref 70–99)
Potassium: 4.3 mEq/L (ref 3.5–5.1)
SODIUM: 139 meq/L (ref 135–145)
TOTAL PROTEIN: 7.1 g/dL (ref 6.0–8.3)

## 2017-12-09 NOTE — Progress Notes (Signed)
Subjective  Chief Complaint  Patient presents with  . colonic abscess    HPI: Morgan [name] is a 67 y.o. female who presents to [facility] today for a Female Wellness Visit.   Wellness Visit: annual visit with health maintenance review and exam without Pap   67 year old female recovering from diverticular abscess with complication-she is scheduled for partial colectomy in 48 hours due to colonic stricturing from infection.  She has been doing better although over the last 48 hours she is complaining of cramping pain and continues to have multiple loose stools per day.  She is completed a prolonged course of broad-spectrum antibiotics and infection seems to be resolved.  Hypertension follow-up: Doing well without chest pain, shortness of breath, lower extremity edema, palpitations.  Tolerating medications well.  Compliance is good.  Blood pressures been well controlled  Hyperlipidemia follow-up: Tolerating statin without adverse effects.  Cholesterol has been well controlled.  Rheumatoid arthritis is stable with pain in the left wrist.  She is on Plaquenil. Lifestyle: Body mass index is 31.93 kg/m. Wt Readings from Last 3 Encounters:  12/09/17 186 lb (84.4 kg)  12/06/17 183 lb (83 kg)  11/20/17 187 lb (84.8 kg)   Diet: general Exercise: rarely, Due to illness  Patient Active Problem List   Diagnosis Date Noted  . Abscess of sigmoid colon due to diverticulitis 11/04/2017  . Rheumatoid arthritis involving multiple sites with positive rheumatoid factor ([place]) 10/31/2017  . Immunosuppressed status ([place]) 10/08/2017  . Obesity with body mass index 30 or greater 01/25/2017  . Current chronic use of systemic steroids 06/08/2016  . Uses hearing aid 12/09/2015  . Leukocytosis 08/27/2014  . Fatty liver disease, nonalcoholic 19/41/7408  . HEARING LOSS 10/31/2008  . Benign colon polyp 10/20/2008  . Essential hypertension 10/20/2008  . Diverticulosis of  colon 10/20/2008  . Mixed hyperlipidemia 04/23/2008  . VENOUS INSUFFICIENCY 04/23/2008  . Allergic rhinitis 04/23/2008  . Gastroesophageal reflux disease without esophagitis 04/23/2008   Health Maintenance  Topic Date Due  . TETANUS/TDAP  06/02/2017  . DEXA SCAN  12/27/2017  . COLONOSCOPY  11/20/2018  . MAMMOGRAM  02/14/2019  . INFLUENZA VACCINE  Completed  . Hepatitis C Screening  Completed  . PNA vac Low Risk Adult  Completed   Immunization History  Administered Date(s) Administered  . Influenza Split 01/09/2012, 08/25/2012, 09/30/2013  . Influenza Whole 12/12/2009  . Influenza, High Dose Seasonal PF 09/03/2016, 09/19/2017  . Pneumococcal Conjugate-13 08/27/2014  . Pneumococcal Polysaccharide-23 12/09/2015   We updated and reviewed the patient's past history in detail and it is documented below. Allergies: Patient is allergic to remicade [infliximab]. Past Medical History Patient  has a past medical history of Abnormal chest x-ray, Abnormal electrocardiogram, Allergic rhinitis, Cancer ([place]), Colon polyp, Complication of anesthesia, Contact dermatitis due to poison ivy, Cystitis, Diverticulosis of colon, Dysrhythmia, Fatty liver disease, nonalcoholic, GERD (gastroesophageal reflux disease), Hearing loss, Hemorrhoids, Hyperlipidemia, Hypertension, Lymphocytosis, Overweight(278.02), Pneumonia, PONV (postoperative nausea and vomiting), RA (rheumatoid arthritis) ([place]), and Venous insufficiency. Past Surgical History Patient  has a past surgical history that includes Total abdominal hysterectomy (12/86); Inner ear surgery (Right); Nasal septum surgery; Foot surgery; chemical and laser endovenous ablation of LE veins (2008, 2009, 2010, 2011); and Cesarean section. Family History: Patient family history includes Arthritis in her mother; Atrial fibrillation in her mother; Breast cancer in her other; COPD in her mother; Diabetes in her maternal aunt, maternal uncle, and sister; Healthy in her  sister; Heart disease in  her mother and sister; Heart failure in her brother and son; Hyperlipidemia in her mother; Hypertension in her mother; Liver disease in her sister. Social History:  Patient  reports that she quit smoking about 16 years ago. Her smoking use included cigarettes. She has a 7.50 pack-year smoking history. she has never used smokeless tobacco. She reports that she drinks alcohol. She reports that she does not use drugs.  Review of Systems: Constitutional: negative for fever or malaise Ophthalmic: negative for photophobia, double vision or loss of vision Cardiovascular: negative for chest pain, dyspnea on exertion, or new LE swelling Respiratory: negative for SOB or persistent cough Gastrointestinal: negative for abdominal pain, change in bowel habits or melena Genitourinary: negative for dysuria or gross hematuria, no abnormal uterine bleeding or disharge Musculoskeletal: negative for new gait disturbance or muscular weakness Integumentary: negative for new or persistent rashes, no breast lumps Neurological: negative for TIA or stroke symptoms Psychiatric: negative for SI or delusions Allergic/Immunologic: negative for hives  Patient Care Team    Relationship Specialty Notifications Start End  [name], MD PCP - General Family Medicine  09/01/14   Michael Boston, MD Consulting Physician General Surgery  10/08/17   Irene Shipper, MD Consulting Physician Gastroenterology  10/10/17   Wyatt Portela, MD Consulting Physician Hematology and Oncology  10/10/17   Michel Bickers, MD Consulting Physician Infectious Diseases  11/04/17   Gavin Pound, MD Consulting Physician Rheumatology  11/04/17     Objective  Vitals: BP 138/70 (BP Location: Left Arm, Patient Position: Sitting, Cuff Size: Normal)   Pulse 61   Temp 98 F (36.7 C) (Oral)   Ht 5\' 4"  (1.626 m)   Wt 186 lb (84.4 kg)   SpO2 100%   BMI 31.93 kg/m  General:  Well developed, well nourished, no acute distress    Psych:  Alert and orientedx3,normal mood and affect HEENT:  Normocephalic, atraumatic, non-icteric sclera, PERRL, oropharynx is clear without mass or exudate, supple neck without adenopathy, mass or thyromegaly Cardiovascular:  Normal S1, S2, RRR without gallop, rub or murmur, nondisplaced PMI Respiratory:  Good breath sounds bilaterally, CTAB with normal respiratory effort Gastrointestinal: normal bowel sounds, soft, tender in left lower quadrant without rebound or guarding, no noted masses. No HSM MSK: no deformities, contusions. Joints are without erythema or swelling.  Tender left wrist with movement, no warmth, other joints look good spine and CVA region are nontender Skin:  Warm, no rashes or suspicious lesions noted Neurologic:    Mental status is normal. CN 2-11 are normal. Gross motor and sensory exams are normal. Normal gait. No tremor Breast Exam: No mass, skin retraction or nipple discharge is appreciated in either breast. No axillary adenopathy. Fibrocystic changes are not noted   Assessment  1. Annual physical exam   2. Need for prophylactic vaccination with combined diphtheria-tetanus-pertussis (DTP) vaccine   3. Abscess of sigmoid colon due to diverticulitis   4. Current chronic use of systemic steroids   5. Essential hypertension      Plan  Female Wellness Visit:  Age appropriate Health Maintenance and Prevention measures were discussed with patient. Included topics are cancer screening recommendations, ways to keep healthy (see AVS) including dietary and exercise recommendations, regular eye and dental care, use of seat belts, and avoidance of moderate alcohol use and tobacco use.  Screening tests are up-to-date  BMI: discussed patient's BMI and encouraged positive lifestyle modifications to help get to or maintain a target BMI.  HM needs and immunizations  were addressed and ordered. See below for orders. See HM and immunization section for updates.  Tdap updated  today  Routine labs and screening tests ordered including cmp, cbc and lipids where appropriate.  Discussed recommendations regarding Vit D and calcium supplementation (see AVS)  Blood pressure is well controlled.  Chronic medical problems are well controlled.  Follow up: Return in about 6 months (around 06/08/2018) for follow up Hypertension.   Commons side effects, risks, benefits, and alternatives for medications and treatment plan prescribed today were discussed, and the patient expressed understanding of the given instructions. Patient is instructed to call or message via MyChart if he/she has any questions or concerns regarding our treatment plan. No barriers to understanding were identified. We discussed Red Flag symptoms and signs in detail. Patient expressed understanding regarding what to do in case of urgent or emergency type symptoms.   Medication list was reconciled, printed and provided to the patient in AVS. Patient instructions and summary information was reviewed with the patient as documented in the AVS. This note was prepared with assistance of Dragon voice recognition software. Occasional wrong-word or sound-a-like substitutions may have occurred due to the inherent limitations of voice recognition software  Orders Placed This Encounter  Procedures  . Tdap vaccine greater than or equal to 7yo IM  . CBC with Differential/Platelet  . Comprehensive metabolic panel  . Lipid panel   No orders of the defined types were placed in this encounter.

## 2017-12-09 NOTE — Progress Notes (Signed)
12-09-17 received a call from Gillian Shields, RN that she had spoken to lab, and lab had indicated that [facility] tube was 'disposed of' by mistake. Pt now have the option to come back to have lab redrawn, or can have lab redrawn day of surgery.Marland KitchenMarland KitchenPer pt, she would like lab to be redrawn day of surgery. Lab ordered for day of surgery.

## 2017-12-09 NOTE — Patient Instructions (Signed)
Please return in 6 months for a blood pressure check  If you have any questions or concerns, please don't hesitate to send me a message via MyChart or call the office at [phone]. Thank you for visiting with [place] today! It's our pleasure caring for you.  Please do these things to maintain good health!   Exercise at least 30-45 minutes a day,  4-5 days a week.   Eat a low-fat diet with lots of fruits and vegetables, up to 7-9 servings per day.  Drink plenty of water daily. Try to drink 8 8oz glasses per day.  Seatbelts can save your life. Always wear your seatbelt.  Place Smoke Detectors on every level of your home and check batteries every year.  Schedule an appointment with an eye doctor for an eye exam every 1-2 years  Safe sex - use condoms to protect yourself from STDs if you could be exposed to these types of infections. Use birth control if you do not want to become pregnant and are sexually active.  Avoid heavy alcohol use. If you drink, keep it to less than 2 drinks/day and not every day.  [place].  Choose someone you trust that could speak for you if you became unable to speak for yourself.  Depression is common in our stressful world.If you're feeling down or losing interest in things you normally enjoy, please come in for a visit.  If anyone is threatening or hurting you, please get help. Physical or Emotional Violence is never OK.   Best of luck with your upcoming surgery.

## 2017-12-10 NOTE — Progress Notes (Signed)
cxr 1 view 10-13-17 epic

## 2017-12-10 NOTE — Anesthesia Preprocedure Evaluation (Addendum)
Anesthesia Evaluation  Patient identified by MRN, date of birth, ID band Patient awake    Reviewed: Allergy & Precautions, NPO status , Patient's Chart, lab work & pertinent test results  History of Anesthesia Complications (+) PONV  Airway Mallampati: II  TM Distance: >3 FB Neck ROM: Full    Dental no notable dental hx.    Pulmonary neg pulmonary ROS, former smoker,    Pulmonary exam normal breath sounds clear to auscultation       Cardiovascular hypertension, negative cardio ROS Normal cardiovascular exam Rhythm:Regular Rate:Normal     Neuro/Psych negative neurological ROS  negative psych ROS   GI/Hepatic negative GI ROS, Neg liver ROS, GERD  ,  Endo/Other  negative endocrine ROS  Renal/GU negative Renal ROS  negative genitourinary   Musculoskeletal negative musculoskeletal ROS (+) Arthritis ,   Abdominal   Peds negative pediatric ROS (+)  Hematology negative hematology ROS (+)   Anesthesia Other Findings  Ref Range & Units 4d ago WBC 4.0 - 10.5 K/uL 10.3  RBC 3.87 - 5.11 MIL/uL 4.24  Hemoglobin 12.0 - 15.0 g/dL 12.7  HCT 36.0 - 46.0 % 38.0  MCV 78.0 - 100.0 fL 89.6  MCH 26.0 - 34.0 pg 30.0  MCHC 30.0 - 36.0 g/dL 33.4  RDW 11.5 - 15.5 % 14.9  Platelets 150 - 400 K/uL 158      Reproductive/Obstetrics negative OB ROS                            Anesthesia Physical Anesthesia Plan  ASA: III  Anesthesia Plan: General   Post-op Pain Management:    Induction: Intravenous  PONV Risk Score and Plan: Treatment may vary due to age or medical condition, Ondansetron, Dexamethasone and Midazolam  Airway Management Planned: Oral ETT  Additional Equipment:   Intra-op Plan:   Post-operative Plan: Extubation in OR  Informed Consent: I have reviewed the patients History and Physical, chart, labs and discussed the procedure including the risks, benefits and alternatives for the  proposed anesthesia with the patient or authorized representative who has indicated his/her understanding and acceptance.   Dental advisory given  Plan Discussed with: CRNA  Anesthesia Plan Comments:         Anesthesia Quick Evaluation

## 2017-12-11 ENCOUNTER — Encounter (HOSPITAL_COMMUNITY): Payer: Self-pay | Admitting: *Deleted

## 2017-12-11 ENCOUNTER — Encounter (HOSPITAL_COMMUNITY)
Admission: RE | Disposition: A | Discharge status: Home / Self Care | Payer: Self-pay | Source: Home / Self Care | Attending: Surgery

## 2017-12-11 ENCOUNTER — Other Ambulatory Visit: Payer: Self-pay | Admitting: *Deleted

## 2017-12-11 ENCOUNTER — Inpatient Hospital Stay (HOSPITAL_COMMUNITY)
Admission: RE | Admit: 2017-12-11 | Discharge: 2017-12-17 | DRG: 330 | Disposition: A | Discharge status: Home / Self Care | Payer: BLUE CROSS/BLUE SHIELD | Attending: Surgery | Admitting: Surgery

## 2017-12-11 ENCOUNTER — Inpatient Hospital Stay (HOSPITAL_COMMUNITY): Payer: BLUE CROSS/BLUE SHIELD | Admitting: Anesthesiology

## 2017-12-11 DIAGNOSIS — E669 Obesity, unspecified: Secondary | ICD-10-CM | POA: Diagnosis not present

## 2017-12-11 DIAGNOSIS — Z7952 Long term (current) use of systemic steroids: Secondary | ICD-10-CM

## 2017-12-11 DIAGNOSIS — K5780 Diverticulitis of intestine, part unspecified, with perforation and abscess without bleeding: Secondary | ICD-10-CM | POA: Diagnosis present

## 2017-12-11 DIAGNOSIS — D849 Immunodeficiency, unspecified: Secondary | ICD-10-CM | POA: Diagnosis present

## 2017-12-11 DIAGNOSIS — Z8249 Family history of ischemic heart disease and other diseases of the circulatory system: Secondary | ICD-10-CM | POA: Diagnosis not present

## 2017-12-11 DIAGNOSIS — H9190 Unspecified hearing loss, unspecified ear: Secondary | ICD-10-CM | POA: Diagnosis not present

## 2017-12-11 DIAGNOSIS — N838 Other noninflammatory disorders of ovary, fallopian tube and broad ligament: Secondary | ICD-10-CM | POA: Diagnosis not present

## 2017-12-11 DIAGNOSIS — K760 Fatty (change of) liver, not elsewhere classified: Secondary | ICD-10-CM | POA: Diagnosis present

## 2017-12-11 DIAGNOSIS — I872 Venous insufficiency (chronic) (peripheral): Secondary | ICD-10-CM | POA: Diagnosis not present

## 2017-12-11 DIAGNOSIS — Z973 Presence of spectacles and contact lenses: Secondary | ICD-10-CM

## 2017-12-11 DIAGNOSIS — E785 Hyperlipidemia, unspecified: Secondary | ICD-10-CM | POA: Diagnosis present

## 2017-12-11 DIAGNOSIS — I10 Essential (primary) hypertension: Secondary | ICD-10-CM | POA: Diagnosis present

## 2017-12-11 DIAGNOSIS — Z8261 Family history of arthritis: Secondary | ICD-10-CM | POA: Diagnosis not present

## 2017-12-11 DIAGNOSIS — K219 Gastro-esophageal reflux disease without esophagitis: Secondary | ICD-10-CM | POA: Diagnosis not present

## 2017-12-11 DIAGNOSIS — Z6831 Body mass index (BMI) 31.0-31.9, adult: Secondary | ICD-10-CM

## 2017-12-11 DIAGNOSIS — Z833 Family history of diabetes mellitus: Secondary | ICD-10-CM | POA: Diagnosis not present

## 2017-12-11 DIAGNOSIS — Z811 Family history of alcohol abuse and dependence: Secondary | ICD-10-CM | POA: Diagnosis not present

## 2017-12-11 DIAGNOSIS — Z803 Family history of malignant neoplasm of breast: Secondary | ICD-10-CM

## 2017-12-11 DIAGNOSIS — Z836 Family history of other diseases of the respiratory system: Secondary | ICD-10-CM | POA: Diagnosis not present

## 2017-12-11 DIAGNOSIS — E782 Mixed hyperlipidemia: Secondary | ICD-10-CM | POA: Diagnosis not present

## 2017-12-11 DIAGNOSIS — K5720 Diverticulitis of large intestine with perforation and abscess without bleeding: Secondary | ICD-10-CM | POA: Diagnosis not present

## 2017-12-11 DIAGNOSIS — N83209 Unspecified ovarian cyst, unspecified side: Secondary | ICD-10-CM | POA: Diagnosis not present

## 2017-12-11 DIAGNOSIS — Z808 Family history of malignant neoplasm of other organs or systems: Secondary | ICD-10-CM

## 2017-12-11 DIAGNOSIS — M0579 Rheumatoid arthritis with rheumatoid factor of multiple sites without organ or systems involvement: Secondary | ICD-10-CM | POA: Diagnosis present

## 2017-12-11 DIAGNOSIS — Z7982 Long term (current) use of aspirin: Secondary | ICD-10-CM | POA: Diagnosis not present

## 2017-12-11 DIAGNOSIS — N736 Female pelvic peritoneal adhesions (postinfective): Secondary | ICD-10-CM | POA: Diagnosis present

## 2017-12-11 DIAGNOSIS — K567 Ileus, unspecified: Secondary | ICD-10-CM | POA: Diagnosis not present

## 2017-12-11 DIAGNOSIS — D899 Disorder involving the immune mechanism, unspecified: Secondary | ICD-10-CM

## 2017-12-11 HISTORY — PX: PROCTOSCOPY: SHX2266

## 2017-12-11 HISTORY — DX: Diverticulitis of intestine, part unspecified, with perforation and abscess without bleeding: K57.80

## 2017-12-11 HISTORY — PX: LAPAROSCOPIC LYSIS OF ADHESIONS: SHX5905

## 2017-12-11 LAB — TYPE AND SCREEN
ABO/RH(D): O POS
Antibody Screen: NEGATIVE

## 2017-12-11 LAB — HEMOGLOBIN A1C
Hgb A1c MFr Bld: 5.4 % (ref 4.8–5.6)
MEAN PLASMA GLUCOSE: 108.28 mg/dL

## 2017-12-11 SURGERY — COLECTOMY, PARTIAL, ROBOT-ASSISTED, LAPAROSCOPIC
Anesthesia: General | Site: Rectum

## 2017-12-11 MED ORDER — ADULT MULTIVITAMIN W/MINERALS CH
1.0000 | ORAL_TABLET | Freq: Every day | ORAL | Status: DC
  Filled 2017-12-11: qty 1

## 2017-12-11 MED ORDER — LACTATED RINGERS IV SOLN: 1000.0000 mL | Freq: Three times a day (TID) | INTRAVENOUS | Status: AC | PRN

## 2017-12-11 MED ORDER — ROCURONIUM BROMIDE 50 MG/5ML IV SOSY
PREFILLED_SYRINGE | INTRAVENOUS | Status: AC
  Filled 2017-12-11: qty 5

## 2017-12-11 MED ORDER — PREDNISONE 5 MG PO TABS
5.0000 mg | ORAL_TABLET | Freq: Every day | ORAL | Status: DC
  Administered 2017-12-12 – 2017-12-17 (×6): 5 mg via ORAL
  Filled 2017-12-11 (×6): qty 1

## 2017-12-11 MED ORDER — ALVIMOPAN 12 MG PO CAPS
12.0000 mg | ORAL_CAPSULE | Freq: Once | ORAL | Status: AC
  Administered 2017-12-11: 12 mg via ORAL
  Filled 2017-12-11: qty 1

## 2017-12-11 MED ORDER — ENOXAPARIN SODIUM 40 MG/0.4ML ~~LOC~~ SOLN
40.0000 mg | SUBCUTANEOUS | Status: DC
  Administered 2017-12-12 – 2017-12-17 (×6): 40 mg via SUBCUTANEOUS
  Filled 2017-12-11 (×6): qty 0.4

## 2017-12-11 MED ORDER — PHENOL 1.4 % MT LIQD: 1.0000 | OROMUCOSAL | Status: DC | PRN

## 2017-12-11 MED ORDER — HYDROCORTISONE 2.5 % RE CREA
1.0000 "application " | TOPICAL_CREAM | Freq: Four times a day (QID) | RECTAL | Status: DC | PRN
  Filled 2017-12-11: qty 28.35

## 2017-12-11 MED ORDER — LACTATED RINGERS IV BOLUS (SEPSIS): 1000.0000 mL | Freq: Three times a day (TID) | INTRAVENOUS | Status: AC | PRN

## 2017-12-11 MED ORDER — POLYETHYLENE GLYCOL 3350 17 GM/SCOOP PO POWD
1.0000 | Freq: Once | ORAL | Status: DC
  Filled 2017-12-11: qty 255

## 2017-12-11 MED ORDER — DEXTROSE 5 % IV SOLN
2.0000 g | Freq: Two times a day (BID) | INTRAVENOUS | Status: AC
  Administered 2017-12-11: 2 g via INTRAVENOUS
  Filled 2017-12-11: qty 2

## 2017-12-11 MED ORDER — DEXAMETHASONE SODIUM PHOSPHATE 10 MG/ML IJ SOLN
INTRAMUSCULAR | Status: AC
  Filled 2017-12-11: qty 1

## 2017-12-11 MED ORDER — MENTHOL 3 MG MT LOZG: 1.0000 | LOZENGE | OROMUCOSAL | Status: DC | PRN

## 2017-12-11 MED ORDER — SODIUM CHLORIDE 0.9 % IV SOLN
INTRAVENOUS | Status: DC
  Filled 2017-12-11: qty 6

## 2017-12-11 MED ORDER — MEPERIDINE HCL 50 MG/ML IJ SOLN: 6.2500 mg | INTRAMUSCULAR | Status: DC | PRN

## 2017-12-11 MED ORDER — KETAMINE HCL 10 MG/ML IJ SOLN
INTRAMUSCULAR | Status: DC | PRN
  Administered 2017-12-11: 30 mg via INTRAVENOUS
  Administered 2017-12-11: 10 mg via INTRAVENOUS

## 2017-12-11 MED ORDER — HYDROMORPHONE HCL 1 MG/ML IJ SOLN
INTRAMUSCULAR | Status: AC
  Filled 2017-12-11: qty 1

## 2017-12-11 MED ORDER — GABAPENTIN 300 MG PO CAPS
300.0000 mg | ORAL_CAPSULE | ORAL | Status: AC
  Administered 2017-12-11: 300 mg via ORAL
  Filled 2017-12-11: qty 1

## 2017-12-11 MED ORDER — HYDROCODONE-ACETAMINOPHEN 7.5-325 MG PO TABS: 1.0000 | ORAL_TABLET | Freq: Once | ORAL | Status: DC | PRN

## 2017-12-11 MED ORDER — BISACODYL 5 MG PO TBEC
20.0000 mg | DELAYED_RELEASE_TABLET | Freq: Once | ORAL | Status: DC
  Filled 2017-12-11: qty 4

## 2017-12-11 MED ORDER — BUPIVACAINE-EPINEPHRINE (PF) 0.25% -1:200000 IJ SOLN
INTRAMUSCULAR | Status: DC | PRN
  Administered 2017-12-11: 50 mL

## 2017-12-11 MED ORDER — ACETAMINOPHEN 500 MG PO TABS
1000.0000 mg | ORAL_TABLET | Freq: Three times a day (TID) | ORAL | Status: DC
  Administered 2017-12-11 – 2017-12-13 (×5): 1000 mg via ORAL
  Filled 2017-12-11 (×5): qty 2

## 2017-12-11 MED ORDER — ROCURONIUM BROMIDE 50 MG/5ML IV SOSY
PREFILLED_SYRINGE | INTRAVENOUS | Status: DC | PRN
  Administered 2017-12-11: 50 mg via INTRAVENOUS
  Administered 2017-12-11: 10 mg via INTRAVENOUS

## 2017-12-11 MED ORDER — 0.9 % SODIUM CHLORIDE (POUR BTL) OPTIME
TOPICAL | Status: DC | PRN
  Administered 2017-12-11: 2000 mL

## 2017-12-11 MED ORDER — BUPIVACAINE-EPINEPHRINE 0.25% -1:200000 IJ SOLN
INTRAMUSCULAR | Status: AC
  Filled 2017-12-11: qty 1

## 2017-12-11 MED ORDER — ENOXAPARIN SODIUM 40 MG/0.4ML ~~LOC~~ SOLN
40.0000 mg | Freq: Once | SUBCUTANEOUS | Status: AC
  Administered 2017-12-11: 40 mg via SUBCUTANEOUS
  Filled 2017-12-11: qty 0.4

## 2017-12-11 MED ORDER — PROPOFOL 10 MG/ML IV BOLUS
INTRAVENOUS | Status: AC
  Filled 2017-12-11: qty 20

## 2017-12-11 MED ORDER — SUGAMMADEX SODIUM 200 MG/2ML IV SOLN
INTRAVENOUS | Status: DC | PRN
  Administered 2017-12-11: 200 mg via INTRAVENOUS

## 2017-12-11 MED ORDER — LACTATED RINGERS IR SOLN
Status: DC | PRN
  Administered 2017-12-11: 1000 mL

## 2017-12-11 MED ORDER — LIDOCAINE HCL 2 % IJ SOLN
INTRAMUSCULAR | Status: AC
  Filled 2017-12-11: qty 20

## 2017-12-11 MED ORDER — ASPIRIN EC 81 MG PO TBEC
81.0000 mg | DELAYED_RELEASE_TABLET | ORAL | Status: DC
  Administered 2017-12-13 – 2017-12-16 (×2): 81 mg via ORAL
  Filled 2017-12-11 (×2): qty 1

## 2017-12-11 MED ORDER — GUAIFENESIN-DM 100-10 MG/5ML PO SYRP
10.0000 mL | ORAL_SOLUTION | ORAL | Status: DC | PRN
Start: 2017-12-11 — End: 2017-12-17

## 2017-12-11 MED ORDER — MAGIC MOUTHWASH
15.0000 mL | Freq: Four times a day (QID) | ORAL | Status: DC | PRN
  Administered 2017-12-16: 15 mL via ORAL
  Filled 2017-12-11: qty 15

## 2017-12-11 MED ORDER — SACCHAROMYCES BOULARDII 250 MG PO CAPS
250.0000 mg | ORAL_CAPSULE | Freq: Two times a day (BID) | ORAL | Status: DC
  Administered 2017-12-11 – 2017-12-17 (×12): 250 mg via ORAL
  Filled 2017-12-11 (×12): qty 1

## 2017-12-11 MED ORDER — HYDROCORTISONE 1 % EX CREA: 1.0000 "application " | TOPICAL_CREAM | Freq: Three times a day (TID) | CUTANEOUS | Status: DC | PRN

## 2017-12-11 MED ORDER — DEXAMETHASONE SODIUM PHOSPHATE 10 MG/ML IJ SOLN
INTRAMUSCULAR | Status: DC | PRN
  Administered 2017-12-11: 6 mg via INTRAVENOUS

## 2017-12-11 MED ORDER — HYDROMORPHONE HCL 1 MG/ML IJ SOLN
0.5000 mg | INTRAMUSCULAR | Status: DC | PRN
  Administered 2017-12-11 (×2): 0.5 mg via INTRAVENOUS
  Administered 2017-12-14: 1 mg via INTRAVENOUS
  Filled 2017-12-11 (×3): qty 1

## 2017-12-11 MED ORDER — HYOSCYAMINE SULFATE 0.125 MG SL SUBL
0.1250 mg | SUBLINGUAL_TABLET | Freq: Four times a day (QID) | SUBLINGUAL | Status: DC | PRN
  Filled 2017-12-11: qty 1

## 2017-12-11 MED ORDER — LACTATED RINGERS IV SOLN
INTRAVENOUS | Status: DC
  Administered 2017-12-11 (×2): via INTRAVENOUS

## 2017-12-11 MED ORDER — HYDROMORPHONE HCL 1 MG/ML IJ SOLN
0.2500 mg | INTRAMUSCULAR | Status: DC | PRN
  Administered 2017-12-11 (×3): 0.5 mg via INTRAVENOUS

## 2017-12-11 MED ORDER — LORATADINE 10 MG PO TABS
10.0000 mg | ORAL_TABLET | Freq: Every day | ORAL | Status: DC
  Administered 2017-12-12 – 2017-12-17 (×6): 10 mg via ORAL
  Filled 2017-12-11 (×6): qty 1

## 2017-12-11 MED ORDER — SUCCINYLCHOLINE CHLORIDE 200 MG/10ML IV SOSY
PREFILLED_SYRINGE | INTRAVENOUS | Status: AC
  Filled 2017-12-11: qty 10

## 2017-12-11 MED ORDER — FENTANYL CITRATE (PF) 100 MCG/2ML IJ SOLN
INTRAMUSCULAR | Status: AC
  Filled 2017-12-11: qty 2

## 2017-12-11 MED ORDER — DIPHENHYDRAMINE HCL 50 MG/ML IJ SOLN: 12.5000 mg | Freq: Four times a day (QID) | INTRAMUSCULAR | Status: DC | PRN

## 2017-12-11 MED ORDER — METHYLENE BLUE 0.5 % INJ SOLN
INTRAVENOUS | Status: AC
  Filled 2017-12-11: qty 10

## 2017-12-11 MED ORDER — GABAPENTIN 300 MG PO CAPS
300.0000 mg | ORAL_CAPSULE | Freq: Two times a day (BID) | ORAL | Status: DC
  Administered 2017-12-11 – 2017-12-15 (×9): 300 mg via ORAL
  Filled 2017-12-11 (×9): qty 1

## 2017-12-11 MED ORDER — EPHEDRINE SULFATE-NACL 50-0.9 MG/10ML-% IV SOSY
PREFILLED_SYRINGE | INTRAVENOUS | Status: DC | PRN
  Administered 2017-12-11 (×2): 5 mg via INTRAVENOUS

## 2017-12-11 MED ORDER — ZOLPIDEM TARTRATE 5 MG PO TABS: 5.0000 mg | ORAL_TABLET | Freq: Every evening | ORAL | Status: DC | PRN

## 2017-12-11 MED ORDER — BUPIVACAINE LIPOSOME 1.3 % IJ SUSP
INTRAMUSCULAR | Status: DC | PRN
  Administered 2017-12-11: 20 mL

## 2017-12-11 MED ORDER — SODIUM CHLORIDE 0.9 % IV SOLN
INTRAVENOUS | Status: DC
  Administered 2017-12-11: 16:00:00 via INTRAVENOUS

## 2017-12-11 MED ORDER — ONDANSETRON HCL 4 MG/2ML IJ SOLN
INTRAMUSCULAR | Status: AC
  Filled 2017-12-11: qty 2

## 2017-12-11 MED ORDER — EPHEDRINE 5 MG/ML INJ
INTRAVENOUS | Status: AC
  Filled 2017-12-11: qty 10

## 2017-12-11 MED ORDER — TRAMADOL HCL 50 MG PO TABS: 50.0000 mg | ORAL_TABLET | Freq: Four times a day (QID) | ORAL | 0 refills | Status: DC | PRN

## 2017-12-11 MED ORDER — PROPOFOL 10 MG/ML IV BOLUS
INTRAVENOUS | Status: DC | PRN
  Administered 2017-12-11: 130 mg via INTRAVENOUS

## 2017-12-11 MED ORDER — MIDAZOLAM HCL 5 MG/5ML IJ SOLN
INTRAMUSCULAR | Status: DC | PRN
  Administered 2017-12-11: 1 mg via INTRAVENOUS

## 2017-12-11 MED ORDER — NEOMYCIN SULFATE 500 MG PO TABS
1000.0000 mg | ORAL_TABLET | ORAL | Status: DC
  Filled 2017-12-11: qty 2

## 2017-12-11 MED ORDER — METRONIDAZOLE 500 MG PO TABS
1000.0000 mg | ORAL_TABLET | ORAL | Status: DC
  Filled 2017-12-11: qty 2

## 2017-12-11 MED ORDER — LIDOCAINE 2% (20 MG/ML) 5 ML SYRINGE
INTRAMUSCULAR | Status: DC | PRN
  Administered 2017-12-11: 100 mg via INTRAVENOUS

## 2017-12-11 MED ORDER — SUGAMMADEX SODIUM 200 MG/2ML IV SOLN
INTRAVENOUS | Status: AC
  Filled 2017-12-11: qty 2

## 2017-12-11 MED ORDER — KETAMINE HCL 10 MG/ML IJ SOLN
INTRAMUSCULAR | Status: AC
  Filled 2017-12-11: qty 1

## 2017-12-11 MED ORDER — POLYVINYL ALCOHOL 1.4 % OP SOLN: 2.0000 [drp] | OPHTHALMIC | Status: DC | PRN

## 2017-12-11 MED ORDER — LIDOCAINE 2% (20 MG/ML) 5 ML SYRINGE
INTRAMUSCULAR | Status: DC | PRN
  Administered 2017-12-11: 1.5 mg/kg/h via INTRAVENOUS

## 2017-12-11 MED ORDER — ACETAMINOPHEN 10 MG/ML IV SOLN: 1000.0000 mg | Freq: Once | INTRAVENOUS | Status: DC | PRN

## 2017-12-11 MED ORDER — ONDANSETRON HCL 4 MG PO TABS
4.0000 mg | ORAL_TABLET | Freq: Four times a day (QID) | ORAL | Status: DC | PRN
  Administered 2017-12-16 (×2): 4 mg via ORAL
  Filled 2017-12-11 (×2): qty 1

## 2017-12-11 MED ORDER — ONDANSETRON HCL 4 MG/2ML IJ SOLN
4.0000 mg | Freq: Four times a day (QID) | INTRAMUSCULAR | Status: DC | PRN
  Administered 2017-12-11 – 2017-12-15 (×11): 4 mg via INTRAVENOUS
  Filled 2017-12-11 (×11): qty 2

## 2017-12-11 MED ORDER — PROMETHAZINE HCL 25 MG/ML IJ SOLN: 6.2500 mg | INTRAMUSCULAR | Status: DC | PRN

## 2017-12-11 MED ORDER — LIDOCAINE 2% (20 MG/ML) 5 ML SYRINGE
INTRAMUSCULAR | Status: AC
  Filled 2017-12-11: qty 5

## 2017-12-11 MED ORDER — PHENYLEPHRINE 40 MCG/ML (10ML) SYRINGE FOR IV PUSH (FOR BLOOD PRESSURE SUPPORT)
PREFILLED_SYRINGE | INTRAVENOUS | Status: DC | PRN
  Administered 2017-12-11 (×4): 40 ug via INTRAVENOUS
  Administered 2017-12-11: 120 ug via INTRAVENOUS
  Administered 2017-12-11: 40 ug via INTRAVENOUS
  Administered 2017-12-11: 80 ug via INTRAVENOUS

## 2017-12-11 MED ORDER — SIMVASTATIN 40 MG PO TABS
40.0000 mg | ORAL_TABLET | Freq: Every day | ORAL | Status: DC
  Administered 2017-12-11 – 2017-12-16 (×6): 40 mg via ORAL
  Filled 2017-12-11 (×6): qty 2

## 2017-12-11 MED ORDER — CALCIUM CARBONATE-VITAMIN D 500-200 MG-UNIT PO TABS
1.0000 | ORAL_TABLET | Freq: Two times a day (BID) | ORAL | Status: DC
  Administered 2017-12-11 – 2017-12-17 (×12): 1 via ORAL
  Filled 2017-12-11 (×12): qty 1

## 2017-12-11 MED ORDER — FENTANYL CITRATE (PF) 250 MCG/5ML IJ SOLN
INTRAMUSCULAR | Status: AC
  Filled 2017-12-11: qty 5

## 2017-12-11 MED ORDER — ALVIMOPAN 12 MG PO CAPS
12.0000 mg | ORAL_CAPSULE | Freq: Two times a day (BID) | ORAL | Status: DC
  Administered 2017-12-12 – 2017-12-14 (×5): 12 mg via ORAL
  Filled 2017-12-11 (×6): qty 1

## 2017-12-11 MED ORDER — VITAMIN D3 25 MCG (1000 UNIT) PO TABS
2000.0000 [IU] | ORAL_TABLET | Freq: Every day | ORAL | Status: DC
  Administered 2017-12-12 – 2017-12-17 (×6): 2000 [IU] via ORAL
  Filled 2017-12-11 (×13): qty 2

## 2017-12-11 MED ORDER — MIDAZOLAM HCL 2 MG/2ML IJ SOLN
INTRAMUSCULAR | Status: AC
  Filled 2017-12-11: qty 2

## 2017-12-11 MED ORDER — ALUM & MAG HYDROXIDE-SIMETH 200-200-20 MG/5ML PO SUSP
30.0000 mL | Freq: Four times a day (QID) | ORAL | Status: DC | PRN
  Administered 2017-12-13 – 2017-12-15 (×4): 30 mL via ORAL
  Filled 2017-12-11 (×5): qty 30

## 2017-12-11 MED ORDER — ACETAMINOPHEN 500 MG PO TABS
1000.0000 mg | ORAL_TABLET | ORAL | Status: AC
  Administered 2017-12-11: 1000 mg via ORAL
  Filled 2017-12-11: qty 2

## 2017-12-11 MED ORDER — LIP MEDEX EX OINT
1.0000 "application " | TOPICAL_OINTMENT | Freq: Two times a day (BID) | CUTANEOUS | Status: DC
  Administered 2017-12-11 – 2017-12-16 (×7): 1 via TOPICAL
  Filled 2017-12-11 (×3): qty 7

## 2017-12-11 MED ORDER — SODIUM CHLORIDE 0.9 % IV SOLN
INTRAVENOUS | Status: DC | PRN
  Administered 2017-12-11: 1000 mL

## 2017-12-11 MED ORDER — DIPHENHYDRAMINE HCL 12.5 MG/5ML PO ELIX: 12.5000 mg | ORAL_SOLUTION | Freq: Four times a day (QID) | ORAL | Status: DC | PRN

## 2017-12-11 MED ORDER — METOPROLOL TARTRATE 5 MG/5ML IV SOLN: 5.0000 mg | Freq: Four times a day (QID) | INTRAVENOUS | Status: DC | PRN

## 2017-12-11 MED ORDER — BUPIVACAINE LIPOSOME 1.3 % IJ SUSP
20.0000 mL | INTRAMUSCULAR | Status: DC
  Filled 2017-12-11: qty 20

## 2017-12-11 MED ORDER — FENTANYL CITRATE (PF) 100 MCG/2ML IJ SOLN
INTRAMUSCULAR | Status: DC | PRN
  Administered 2017-12-11: 50 ug via INTRAVENOUS
  Administered 2017-12-11: 100 ug via INTRAVENOUS
  Administered 2017-12-11 (×4): 50 ug via INTRAVENOUS

## 2017-12-11 MED ORDER — HYDROXYCHLOROQUINE SULFATE 200 MG PO TABS
400.0000 mg | ORAL_TABLET | Freq: Every day | ORAL | Status: DC
  Administered 2017-12-12 – 2017-12-17 (×6): 400 mg via ORAL
  Filled 2017-12-11 (×6): qty 2

## 2017-12-11 MED ORDER — PSYLLIUM 95 % PO PACK
1.0000 | PACK | Freq: Every day | ORAL | Status: DC
Start: 2017-12-12 — End: 2017-12-13
  Administered 2017-12-12: 1 via ORAL
  Filled 2017-12-11: qty 1

## 2017-12-11 MED ORDER — METOCLOPRAMIDE HCL 5 MG/ML IJ SOLN
5.0000 mg | Freq: Four times a day (QID) | INTRAMUSCULAR | Status: DC | PRN
Start: 2017-12-11 — End: 2017-12-17
  Administered 2017-12-11 – 2017-12-16 (×2): 10 mg via INTRAVENOUS
  Filled 2017-12-11 (×2): qty 2

## 2017-12-11 MED ORDER — CEFOTETAN DISODIUM-DEXTROSE 2-2.08 GM-%(50ML) IV SOLR
2.0000 g | INTRAVENOUS | Status: AC
  Administered 2017-12-11: 2 g via INTRAVENOUS
  Filled 2017-12-11: qty 50

## 2017-12-11 MED ORDER — ONDANSETRON HCL 4 MG/2ML IJ SOLN
INTRAMUSCULAR | Status: DC | PRN
  Administered 2017-12-11: 4 mg via INTRAVENOUS

## 2017-12-11 SURGICAL SUPPLY — 97 items
APPLIER CLIP 5 13 M/L LIGAMAX5 (MISCELLANEOUS)
APPLIER CLIP ROT 10 11.4 M/L (STAPLE)
BLADE EXTENDED COATED 6.5IN (ELECTRODE) ×3 IMPLANT
CANNULA REDUC XI 12-8 STAPL (CANNULA) ×1
CANNULA REDUCER 12-8 DVNC XI (CANNULA) ×2 IMPLANT
CELLS DAT CNTRL 66122 CELL SVR (MISCELLANEOUS) IMPLANT
CHLORAPREP W/TINT 26ML (MISCELLANEOUS) ×3 IMPLANT
CLIP APPLIE 5 13 M/L LIGAMAX5 (MISCELLANEOUS) IMPLANT
CLIP APPLIE ROT 10 11.4 M/L (STAPLE) IMPLANT
CLIP VESOLOCK LG 6/CT PURPLE (CLIP) IMPLANT
CLIP VESOLOCK MED LG 6/CT (CLIP) IMPLANT
COVER SURGICAL LIGHT HANDLE (MISCELLANEOUS) ×3 IMPLANT
COVER TIP SHEARS 8 DVNC (MISCELLANEOUS) ×2 IMPLANT
COVER TIP SHEARS 8MM DA VINCI (MISCELLANEOUS) ×1
DECANTER SPIKE VIAL GLASS SM (MISCELLANEOUS) ×3 IMPLANT
DEVICE TROCAR PUNCTURE CLOSURE (ENDOMECHANICALS) IMPLANT
DRAIN CHANNEL 19F RND (DRAIN) ×3 IMPLANT
DRAPE ARM DVNC X/XI (DISPOSABLE) ×6 IMPLANT
DRAPE COLUMN DVNC XI (DISPOSABLE) ×2 IMPLANT
DRAPE DA VINCI XI ARM (DISPOSABLE) ×3
DRAPE DA VINCI XI COLUMN (DISPOSABLE) ×1
DRAPE SURG IRRIG POUCH 19X23 (DRAPES) ×3 IMPLANT
DRSG OPSITE POSTOP 4X10 (GAUZE/BANDAGES/DRESSINGS) IMPLANT
DRSG OPSITE POSTOP 4X6 (GAUZE/BANDAGES/DRESSINGS) ×3 IMPLANT
DRSG OPSITE POSTOP 4X8 (GAUZE/BANDAGES/DRESSINGS) IMPLANT
DRSG TEGADERM 2-3/8X2-3/4 SM (GAUZE/BANDAGES/DRESSINGS) ×3 IMPLANT
DRSG TEGADERM 4X4.75 (GAUZE/BANDAGES/DRESSINGS) ×3 IMPLANT
ELECT PENCIL ROCKER SW 15FT (MISCELLANEOUS) ×3 IMPLANT
ELECT REM PT RETURN 15FT ADLT (MISCELLANEOUS) ×3 IMPLANT
ENDOLOOP SUT PDS II  0 18 (SUTURE)
ENDOLOOP SUT PDS II 0 18 (SUTURE) IMPLANT
EVACUATOR SILICONE 100CC (DRAIN) ×3 IMPLANT
GAUZE SPONGE 2X2 8PLY STRL LF (GAUZE/BANDAGES/DRESSINGS) ×2 IMPLANT
GAUZE SPONGE 4X4 12PLY STRL (GAUZE/BANDAGES/DRESSINGS) IMPLANT
GLOVE ECLIPSE 8.0 STRL XLNG CF (GLOVE) ×15 IMPLANT
GLOVE INDICATOR 8.0 STRL GRN (GLOVE) ×15 IMPLANT
GOWN STRL REUS W/TWL XL LVL3 (GOWN DISPOSABLE) ×24 IMPLANT
GRASPER ENDOPATH ANVIL 10MM (MISCELLANEOUS) IMPLANT
GRASPER SUT TROCAR 14GX15 (MISCELLANEOUS) IMPLANT
HOLDER FOLEY CATH W/STRAP (MISCELLANEOUS) ×3 IMPLANT
IRRIG SUCT STRYKERFLOW 2 WTIP (MISCELLANEOUS) ×3
IRRIGATION SUCT STRKRFLW 2 WTP (MISCELLANEOUS) ×2 IMPLANT
IRRIGATOR SUCT 8 DISP DVNC XI (IRRIGATION / IRRIGATOR) IMPLANT
IRRIGATOR SUCTION 8MM XI DISP (IRRIGATION / IRRIGATOR)
KIT PROCEDURE DA VINCI SI (MISCELLANEOUS) ×1
KIT PROCEDURE DVNC SI (MISCELLANEOUS) ×2 IMPLANT
LUBRICANT JELLY K Y 4OZ (MISCELLANEOUS) ×3 IMPLANT
NEEDLE INSUFFLATION 14GA 120MM (NEEDLE) ×3 IMPLANT
PACK CARDIOVASCULAR III (CUSTOM PROCEDURE TRAY) ×3 IMPLANT
PACK COLON (CUSTOM PROCEDURE TRAY) ×3 IMPLANT
PAD POSITIONING PINK XL (MISCELLANEOUS) ×3 IMPLANT
PORT LAP GEL ALEXIS MED 5-9CM (MISCELLANEOUS) ×3 IMPLANT
RTRCTR WOUND ALEXIS 18CM MED (MISCELLANEOUS)
SCISSORS LAP 5X35 DISP (ENDOMECHANICALS) ×3 IMPLANT
SEAL CANN UNIV 5-8 DVNC XI (MISCELLANEOUS) ×8 IMPLANT
SEAL XI 5MM-8MM UNIVERSAL (MISCELLANEOUS) ×4
SEALER VESSEL DA VINCI XI (MISCELLANEOUS) ×1
SEALER VESSEL EXT DVNC XI (MISCELLANEOUS) ×2 IMPLANT
SLEEVE ADV FIXATION 5X100MM (TROCAR) ×3 IMPLANT
SOLUTION ELECTROLUBE (MISCELLANEOUS) ×3 IMPLANT
SPONGE GAUZE 2X2 STER 10/PKG (GAUZE/BANDAGES/DRESSINGS) ×1
STAPLER 45 BLU RELOAD XI (STAPLE) IMPLANT
STAPLER 45 BLUE RELOAD XI (STAPLE)
STAPLER 45 GREEN RELOAD XI (STAPLE) ×2
STAPLER 45 GRN RELOAD XI (STAPLE) ×4 IMPLANT
STAPLER CANNULA SEAL DVNC XI (STAPLE) ×2 IMPLANT
STAPLER CANNULA SEAL XI (STAPLE) ×1
STAPLER SHEATH (SHEATH) ×1
STAPLER SHEATH ENDOWRIST DVNC (SHEATH) ×2 IMPLANT
SUT MNCRL AB 4-0 PS2 18 (SUTURE) ×3 IMPLANT
SUT PDS AB 1 CTX 36 (SUTURE) ×6 IMPLANT
SUT PDS AB 1 TP1 96 (SUTURE) IMPLANT
SUT PDS AB 2-0 CT2 27 (SUTURE) IMPLANT
SUT PROLENE 0 CT 2 (SUTURE) ×3 IMPLANT
SUT PROLENE 2 0 KS (SUTURE) IMPLANT
SUT PROLENE 2 0 SH DA (SUTURE) ×3 IMPLANT
SUT SILK 2 0 (SUTURE) ×1
SUT SILK 2 0 SH CR/8 (SUTURE) ×3 IMPLANT
SUT SILK 2-0 18XBRD TIE 12 (SUTURE) ×2 IMPLANT
SUT SILK 3 0 (SUTURE) ×1
SUT SILK 3 0 SH CR/8 (SUTURE) ×3 IMPLANT
SUT SILK 3-0 18XBRD TIE 12 (SUTURE) ×2 IMPLANT
SUT V-LOC BARB 180 2/0GR6 GS22 (SUTURE)
SUT VIC AB 3-0 SH 18 (SUTURE) IMPLANT
SUT VIC AB 3-0 SH 27 (SUTURE) ×1
SUT VIC AB 3-0 SH 27XBRD (SUTURE) ×2 IMPLANT
SUT VICRYL 0 UR6 27IN ABS (SUTURE) ×3 IMPLANT
SUTURE V-LC BRB 180 2/0GR6GS22 (SUTURE) IMPLANT
SYR 10ML LL (SYRINGE) ×3 IMPLANT
SYS LAPSCP GELPORT 120MM (MISCELLANEOUS)
SYSTEM LAPSCP GELPORT 120MM (MISCELLANEOUS) IMPLANT
TAPE UMBILICAL COTTON 1/8X30 (MISCELLANEOUS) ×3 IMPLANT
TOWEL OR NON WOVEN STRL DISP B (DISPOSABLE) ×3 IMPLANT
TRAY FOLEY CATH 14FRSI W/METER (CATHETERS) ×3 IMPLANT
TROCAR ADV FIXATION 5X100MM (TROCAR) ×3 IMPLANT
TUBING CONNECTING 10 (TUBING) ×6 IMPLANT
TUBING INSUFFLATION 10FT LAP (TUBING) ×3 IMPLANT

## 2017-12-11 NOTE — Interval H&P Note (Signed)
History and Physical Interval Note:  12/11/2017 8:13 AM  Morgan Morgan [name]  has presented today for surgery, with the diagnosis of sigmoid colon diverticulitis with abscess  The various methods of treatment have been discussed with the patient and family. After consideration of risks, benefits and other options for treatment, the patient has consented to  Procedure(s): XI ROBOTIC [place] (N/A) RIDGE PROCTOSCOPY (N/A) as a surgical intervention .    I have re-reviewed the the patient's records, history, medications, and allergies.  I have re-examined the patient.  I again discussed intraoperative plans and goals of post-operative recovery.  The patient agrees to proceed.  Morgan Morgan [name]  07/01/1951 [id number]  Patient Morgan [name] Team: [name], MD as PCP - General (Family Medicine) Michael Boston, MD as Consulting Physician (General Surgery) Irene Shipper, MD as Consulting Physician (Gastroenterology) Wyatt Portela, MD as Consulting Physician (Hematology and Oncology) Michel Bickers, MD as Consulting Physician (Infectious Diseases) Gavin Pound, MD as Consulting Physician (Rheumatology)  Patient Active Problem List   Diagnosis Date Noted  . Immunosuppressed status ([place]) 10/08/2017    Priority: High  . Abscess of sigmoid colon due to diverticulitis 11/04/2017  . Rheumatoid arthritis involving multiple sites with positive rheumatoid factor ([place]) 10/31/2017  . Obesity with body mass index 30 or greater 01/25/2017  . Current chronic use of systemic steroids 06/08/2016  . Uses hearing aid 12/09/2015  . Leukocytosis 08/27/2014  . Fatty liver disease, nonalcoholic 35/46/5681  . HEARING LOSS 10/31/2008  . Benign colon polyp 10/20/2008  . Essential hypertension 10/20/2008  . Diverticulosis of colon 10/20/2008  . Mixed hyperlipidemia 04/23/2008  . VENOUS INSUFFICIENCY 04/23/2008  . Allergic rhinitis 04/23/2008  . Gastroesophageal reflux disease  without esophagitis 04/23/2008    Past Medical History:  Diagnosis Date  . Abnormal chest x-ray   . Abnormal electrocardiogram   . Allergic rhinitis   . Cancer ([place])    Skin non melanoma  . Colon polyp    polypoid colorectal mucosa/adenomatous  . Complication of anesthesia   . Contact dermatitis due to poison ivy   . Cystitis   . Diverticulosis of colon   . Dysrhythmia    skips at times  . Fatty liver disease, nonalcoholic   . GERD (gastroesophageal reflux disease)    occasional  . Hearing loss   . Hemorrhoids   . Hyperlipidemia   . Hypertension   . Lymphocytosis   . Overweight(278.02)   . Pneumonia    2017  . PONV (postoperative nausea and vomiting)   . RA (rheumatoid arthritis) ([facility])    Rheumatoid  . Venous insufficiency     Past Surgical History:  Procedure Laterality Date  . CESAREAN SECTION     [phone]  . chemical and laser endovenous ablation of LE veins  2008, 2009, 2010, 2011   Dr Eilleen Kempf et al  . FOOT SURGERY     left  . INNER EAR SURGERY Right    childhood; right, ear drum repair  . NASAL SEPTUM SURGERY    . TOTAL ABDOMINAL HYSTERECTOMY  12/86    Social History   Socioeconomic History  . Marital status: Married    Spouse name: Morgan Morgan [name]  . Number of children: 4  . Years of education: Not on file  . Highest education level: Not on file  Social Needs  . Financial resource strain: Not on file  . Food insecurity - worry: Not on file  . Food insecurity - inability: Not on  file  . Transportation needs - medical: Not on file  . Transportation needs - non-medical: Not on file  Occupational History  . Occupation: Community education officer: [employer]  Tobacco Use  . Smoking status: Former Smoker    Packs/day: 0.50    Years: 15.00    Pack years: 7.50    Types: Cigarettes    Last attempt to quit: 12/03/2001    Years since quitting: 16.0  . Smokeless tobacco: Never Used  Substance and Sexual Activity  . Alcohol use: Yes    Comment:  occasional  . Drug use: No  . Sexual activity: Yes  Other Topics Concern  . Not on file  Social History Narrative   Married to [place], with children and grandchildren. 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 Hyperlipidemia Mother   . Hypertension Mother   . COPD Mother   . Arthritis Mother   . Heart disease Mother   . Atrial fibrillation Mother   . Diabetes Sister   . Healthy Sister   . Heart disease Sister        sister #1  . Breast cancer Other        half-sister  . Liver disease Sister        sister #1 - hx of liver transplant 14+ yrs ago  . Diabetes Maternal Uncle   . Heart failure Brother   . Diabetes Maternal Aunt        x 2  . Heart failure Son   . Colon cancer Neg Hx     Facility-Administered Medications Prior to Admission  Medication Dose Route Frequency Provider Last Rate Last Dose  . 0.9 %  sodium chloride infusion  500 mL Intravenous Once Irene Shipper, MD       Medications Prior to Admission  Medication Sig Dispense Refill Last Dose  . aspirin 81 MG tablet Take 81 mg by mouth 3 (three) times a week.    12/10/2017 at 0730  . calcium-vitamin D (OSCAL WITH D) 500-200 MG-UNIT tablet Take 1 tablet by mouth 2 (two) times daily.   12/10/2017 at 2200  . Cholecalciferol (EQL VITAMIN D3) 2000 units CAPS Take 2,000 Units by mouth daily.   12/10/2017 at 0700  . fenofibrate 160 MG tablet Take 1 tablet (160 mg total) by mouth daily. (Patient taking differently: Take 160 mg by mouth at bedtime. ) 90 tablet 3 12/10/2017 at 2200  . fexofenadine (ALLEGRA) 180 MG tablet Take 180 mg by mouth daily.   12/11/2017 at 0500  . hydrochlorothiazide (HYDRODIURIL) 25 MG tablet Take 25 mg by mouth daily.   12/10/2017 at 0700  . hydroxychloroquine (PLAQUENIL) 200 MG tablet Take 400 mg by mouth daily.   12/11/2017 at 0500  . hyoscyamine (LEVSIN SL) 0.125 MG SL tablet Take 1 tablet (0.125 mg total) by mouth every 6 (six) hours as  needed. (Patient taking differently: Take 0.125 mg by mouth every 6 (six) hours as needed for cramping. ) 100 tablet 3 12/09/2017  . Hypromellose (ARTIFICIAL TEARS OP) Place 1 drop into both eyes daily as needed (for dry eyes).   12/10/2017 at Unknown time  . metroNIDAZOLE (FLAGYL) 500 MG tablet Take 1,000 mg by mouth See admin instructions. Take 1000 mg by mouth at 1400, 1500 and 2200 the day prior to colon operation  0 12/10/2017 at 2200  . Multiple Vitamin (MULTIVITAMIN) tablet  Take 1 tablet by mouth daily.     12/10/2017 at 0700  . neomycin (MYCIFRADIN) 500 MG tablet Take 1,000 mg by mouth See admin instructions. Take 1000 mg by mouth at 1400, 1500 and 2200 the day before surgery  0 12/10/2017 at 2200  . ondansetron (ZOFRAN) 4 MG tablet Take 1 tablet (4 mg total) every 6 (six) hours as needed by mouth for nausea. 20 tablet 0 12/10/2017 at 1500  . polyethylene glycol powder (GLYCOLAX/MIRALAX) powder Take 255 g by mouth daily. (Patient taking differently: Take 17 g by mouth daily. ) 255 g 3 12/10/2017 at 1400  . predniSONE (DELTASONE) 5 MG tablet Take 5 mg by mouth daily.  3 12/10/2017 at 0700  . Probiotic Product (PROBIOTIC PO) Take by mouth daily.   12/10/2017 at 0700  . simvastatin (ZOCOR) 40 MG tablet Take 1 tablet (40 mg total) by mouth at bedtime. 90 tablet 3 12/10/2017 at 2200  . acetaminophen (TYLENOL) 500 MG tablet Take 500 mg by mouth every 6 (six) hours as needed for moderate pain or headache.    More than a month at Unknown time  . traMADol (ULTRAM) 50 MG tablet Take 1 tablet (50 mg total) every 6 (six) hours as needed by mouth for severe pain. (Patient not taking: Reported on 12/09/2017) 14 tablet 0 More than a month at Unknown time    Current Facility-Administered Medications  Medication Dose Route Frequency Provider Last Rate Last Dose  . bisacodyl (DULCOLAX) EC tablet 20 mg  20 mg Oral Once Michael Boston, MD      . bupivacaine liposome (EXPAREL) 1.3 % injection 266 mg  20 mL Infiltration On Call to OR  Michael Boston, MD      . cefoTEtan in Dextrose 5% (CEFOTAN) IVPB 2 g  2 g Intravenous On Call to OR Michael Boston, MD      . clindamycin (CLEOCIN) 900 mg, gentamicin (GARAMYCIN) 240 mg in sodium chloride 0.9 % 1,000 mL for intraperitoneal lavage   Intraperitoneal To OR Michael Boston, MD      . lactated ringers infusion   Intravenous Continuous Barnet [place], MD 75 mL/hr at 12/11/17 0723    . neomycin (MYCIFRADIN) tablet 1,000 mg  1,000 mg Oral 3 times per day Michael Boston, MD       And  . metroNIDAZOLE (FLAGYL) tablet 1,000 mg  1,000 mg Oral 3 times per day Michael Boston, MD      . polyethylene glycol powder (GLYCOLAX/MIRALAX) container 255 g  1 Container Oral Once Michael Boston, MD         Allergies  Allergen Reactions  . Remicade [Infliximab] Swelling and Other (See Comments)    Facial swelling, throat was closing up.     BP (!) 141/70   Pulse 70   Temp 97.9 F (36.6 C) (Oral)   Resp 18   Ht 5\' 4"  (1.626 m)   Wt 84.4 kg (186 lb)   SpO2 98%   BMI 31.93 kg/m   Labs: Results for orders placed or performed in visit on 12/09/17 (from the past 48 hour(s))  CBC with Differential/Platelet     Status: Abnormal   Collection Time: 12/09/17  9:29 AM  Result Value Ref Range   WBC 12.9 (H) 4.0 - 10.5 K/uL   RBC 4.31 3.87 - 5.11 Mil/uL   Hemoglobin 12.9 12.0 - 15.0 g/dL   HCT 39.0 36.0 - 46.0 %   MCV 90.5 78.0 - 100.0 fl   MCHC 33.0 30.0 -  36.0 g/dL   RDW 15.6 (H) 11.5 - 15.5 %   Platelets 166.0 150.0 - 400.0 K/uL   Neutrophils Relative % 60.9 43.0 - 77.0 %   Lymphocytes Relative 17.5 12.0 - 46.0 %   Monocytes Relative 20.7 (H) 3.0 - 12.0 %   Eosinophils Relative 0.4 0.0 - 5.0 %   Basophils Relative 0.5 0.0 - 3.0 %   Neutro Abs 7.8 (H) 1.4 - 7.7 K/uL   Lymphs Abs 2.2 0.7 - 4.0 K/uL   Monocytes Absolute 2.7 (H) 0.1 - 1.0 K/uL   Eosinophils Absolute 0.1 0.0 - 0.7 K/uL   Basophils Absolute 0.1 0.0 - 0.1 K/uL  Comprehensive metabolic panel     Status: Abnormal   Collection  Time: 12/09/17  9:29 AM  Result Value Ref Range   Sodium 139 135 - 145 mEq/L   Potassium 4.3 3.5 - 5.1 mEq/L   Chloride 99 96 - 112 mEq/L   CO2 32 19 - 32 mEq/L   Glucose, Bld 92 70 - 99 mg/dL   BUN 21 6 - 23 mg/dL   Creatinine, Ser 0.84 0.40 - 1.20 mg/dL   Total Bilirubin 0.4 0.2 - 1.2 mg/dL   Alkaline Phosphatase 58 39 - 117 U/L   AST 23 0 - 37 U/L   ALT 18 0 - 35 U/L   Total Protein 7.1 6.0 - 8.3 g/dL   Albumin 4.2 3.5 - 5.2 g/dL   Calcium 10.9 (H) 8.4 - 10.5 mg/dL   GFR 71.52 >60.00 mL/min  Lipid panel     Status: None   Collection Time: 12/09/17  9:29 AM  Result Value Ref Range   Cholesterol 139 0 - 200 mg/dL    Comment: ATP III Classification       Desirable:  < 200 mg/dL               Borderline High:  200 - 239 mg/dL          High:  > = 240 mg/dL   Triglycerides 121.0 0.0 - 149.0 mg/dL    Comment: Normal:  <150 mg/dLBorderline High:  150 - 199 mg/dL   HDL 44.60 >39.00 mg/dL   VLDL 24.2 0.0 - 40.0 mg/dL   LDL Cholesterol 70 0 - 99 mg/dL   Total CHOL/HDL Ratio 3     Comment:                Men          Women1/2 Average Risk     3.4          3.3Average Risk          5.0          4.42X Average Risk          9.6          7.13X Average Risk          15.0          11.0                       NonHDL 94.10     Comment: NOTE:  Non-HDL goal should be 30 mg/dL higher than patient's LDL goal (i.e. LDL goal of < 70 mg/dL, would have non-HDL goal of < 100 mg/dL)    Imaging / Studies: Ct Abdomen Pelvis W Contrast  Result Date: 11/13/2017 CLINICAL DATA:  Follow-up diverticular abscess EXAM: CT ABDOMEN AND PELVIS WITH CONTRAST TECHNIQUE: Multidetector CT imaging of the  abdomen and pelvis was performed using the standard protocol following bolus administration of intravenous contrast. CONTRAST:  100 mL Isovue-300 COMPARISON:  CTs from 10/11/2017 and 10/21/2017 FINDINGS: Lower chest: No acute abnormality. Hepatobiliary: The liver is diffusely fatty infiltrated. The gallbladder is well  distended and within normal limits. Pancreas: Unremarkable. No pancreatic ductal dilatation or surrounding inflammatory changes. Spleen: Normal in size without focal abnormality. Adrenals/Urinary Tract: The adrenal glands are within normal limits. No renal or ureteral stones are seen. The bladder is partially distended. Stomach/Bowel: Diverticular change of the colon is again identified. Diffuse wall thickening is noted throughout the sigmoid colon. No definitive acute diverticulitis is seen. The previously noted fluid collection involving the vaginal cuff is no longer identified. Air is noted in this region currently. There is however persistent bladder wall thickening superiorly and anteriorly with a small fluid collection identified within the bladder wall. It is better organized on the current exam than on the previous study. It measures approximately 13 mm and greatest dimension. This is best visualized on image number 60 of series 601. The appendix is within normal limits. Vascular/Lymphatic: Aortic atherosclerosis. No enlarged abdominal or pelvic lymph nodes. Reproductive: Uterus has been surgically removed. Previously seen fluid collection in the vaginal cuff has resolved. Other: No abdominal wall hernia or abnormality. No abdominopelvic ascites. Musculoskeletal: Mild degenerative changes of the lumbar spine are noted. IMPRESSION: Resolution of previously seen fluid collection within the vaginal cuff. Area of bladder wall thickening anteriorly and superiorly best visualized on the coronal imaging. A small fluid collection is noted within the bladder wall better organized than on the prior exam measuring 13 mm. Continued follow-up is recommended. No air is noted within the bladder to suggest persistent fistula. Persistent diverticular change of the sigmoid colon with wall thickening. Electronically Signed   By: Inez Catalina M.D.   On: 11/13/2017 09:50     .[name], M.D., F.A.C.S. Gastrointestinal  and Minimally Invasive Surgery [facility] [address], [place], [place] [address] [phone] Main / Paging  12/11/2017 8:13 AM    The patient's history has been reviewed, patient examined, no change in status, stable for surgery.  I have reviewed the patient's chart and labs.  Questions were answered to the patient's satisfaction.     [name]

## 2017-12-11 NOTE — H&P (Signed)
[name]  Location: [facility] Patient #: [id number] DOB: 11/20/1951 Married / Language: [name] / Race: White Female  Patient Care Team: [name], MD as PCP - General (Family Medicine) Michael Boston, MD as Consulting Physician (General Surgery) Irene Shipper, MD as Consulting Physician (Gastroenterology) Wyatt Portela, MD as Consulting Physician (Hematology and Oncology) Michel Bickers, MD as Consulting Physician (Infectious Diseases) Gavin Pound, MD as Consulting Physician (Rheumatology)   History of Present Illness Morgan Hector MD; 11/04/2017 6:03 PM) The patient is a 67 year old female who presents with diverticulitis. Note for "Diverticulitis": ` ` ` Patient sent for surgical consultation  Chief Complaint: Pelvic abscess presumed diverticulitis in an immunosuppressed patient  The patient is a pleasant female with severe rheumatoid arthritis. Usually immunosuppressed. Came in with abdominal pain and found to have pelvic abscess and presumed diverticulitis. Plastered between rectosigmoid colon and bladder. Not amenable to percutaneous drainage. Placed on antibiotics. Symptoms improved. Abscess smaller from 4x3x2 to 3x1x1cm. Infectious disease consultation made. Placed on IV antibiotics for 4 weeks post-d/c.  She returns 2 weeks after discharge. Her energy level is not normal but is improving. Appetite is better. No fevers or chills. No nausea or vomiting. She does not feel normal, but is better. She does have a history of colon polyps and is due for colonoscopy soon. Has seen gastroenterology to consider screening colonoscopy prior to surgery. No pneumaturia. No increased vaginal bleeding nor drainage. No pyuria. No hematuria.  (Review of systems as stated in this history (HPI) or in the review of systems. Otherwise all other 12 point ROS are negative)   Past Surgical History (Tanisha A. Owens Shark, [place]; 11/04/2017 10:24  AM) Cesarean Section - Multiple  Colon Polyp Removal - Colonoscopy  Foot Surgery  Left. Hysterectomy (due to cancer) - Partial  Hysterectomy (not due to cancer) - Partial   Diagnostic Studies History (Tanisha A. Owens Shark, [place]; 11/04/2017 10:24 AM) Colonoscopy  1-5 years ago Mammogram  within last year Pap Smear  1-5 years ago  Allergies (Tanisha A. Owens Shark, [place]; 11/04/2017 10:25 AM) No Known Drug Allergies 11/04/2017 Allergies Reconciled   Medication History (Tanisha A. Owens Shark, [place]; 11/04/2017 10:26 AM) Ondansetron HCl (4MG  Tablet, Oral) Active. Cephalexin (500MG  Capsule, Oral) Active. Fenofibrate (160MG  Tablet, Oral) Active. Fluconazole (150MG  Tablet, Oral) Active. HydroCHLOROthiazide (25MG  Tablet, Oral) Active. Hydroxychloroquine Sulfate (200MG  Tablet, Oral) Active. Hyoscyamine Sulfate (0.125MG  Tab Sublingual, Sublingual) Active. MetroNIDAZOLE (500MG  Tablet, Oral) Active. PredniSONE (5MG  Tablet, Oral) Active. Simvastatin (40MG  Tablet, Oral) Active. Medications Reconciled  Social History (Tanisha A. Owens Shark, [place]; 11/04/2017 10:24 AM) Alcohol use  Occasional alcohol use. Caffeine use  Carbonated beverages, Coffee, Tea. No drug use  Tobacco use  Former smoker.  Family History (Tanisha A. Owens Shark, [place]; 11/04/2017 10:24 AM) Alcohol Abuse  Mother. Arthritis  Mother, Sister. Breast Cancer  Sister. Diabetes Mellitus  Sister. Heart Disease  Mother, Sister. Heart disease in female family member before age 106  Hypertension  Mother, Sister. Melanoma  Sister. Migraine Headache  Sister. Respiratory Condition  Mother.  Pregnancy / Birth History (Tanisha A. Owens Shark, [place]; 11/04/2017 10:24 AM) Age at menarche  1 years. Age of menopause  31-50 Contraceptive History  Oral contraceptives. Gravida  3 Length (months) of breastfeeding  3-6 Maternal age  62-35 Para  4  Other Problems (Tanisha A. Owens Shark, [place]; 11/04/2017 10:24 AM) Arthritis  Cancer   Diverticulosis  Gastroesophageal Reflux Disease  General anesthesia - complications  Hemorrhoids  High blood pressure  Hypercholesterolemia  Review of Systems (Tanisha A. Brown RMA; 11/04/2017 10:24 AM) General Present- Fatigue. Not Present- Appetite Loss, Chills, Fever, Night Sweats, Weight Gain and Weight Loss. Skin Not Present- Change in Wart/Mole, Dryness, Hives, Jaundice, New Lesions, Non-Healing Wounds, Rash and Ulcer. HEENT Present- Hearing Loss and Wears glasses/contact lenses. Not Present- Earache, Hoarseness, Nose Bleed, Oral Ulcers, Ringing in the Ears, Seasonal Allergies, Sinus Pain, Sore Throat, Visual Disturbances and Yellow Eyes. Respiratory Not Present- Bloody sputum, Chronic Cough, Difficulty Breathing, Snoring and Wheezing. Breast Not Present- Breast Mass, Breast Pain, Nipple Discharge and Skin Changes. Cardiovascular Not Present- Chest Pain, Difficulty Breathing Lying Down, Leg Cramps, Palpitations, Rapid Heart Rate, Shortness of Breath and Swelling of Extremities. Gastrointestinal Present- Abdominal Pain and Bloating. Not Present- Bloody Stool, Change in Bowel Habits, Chronic diarrhea, Constipation, Difficulty Swallowing, Excessive gas, Gets full quickly at meals, Hemorrhoids, Indigestion, Nausea, Rectal Pain and Vomiting. Female Genitourinary Present- Frequency. Not Present- Nocturia, Painful Urination, Pelvic Pain and Urgency. Musculoskeletal Present- Joint Pain and Joint Stiffness. Not Present- Back Pain, Muscle Pain, Muscle Weakness and Swelling of Extremities. Neurological Not Present- Decreased Memory, Fainting, Headaches, Numbness, Seizures, Tingling, Tremor, Trouble walking and Weakness. Psychiatric Not Present- Anxiety, Bipolar, Change in Sleep Pattern, Depression, Fearful and Frequent crying. Endocrine Not Present- Cold Intolerance, Excessive Hunger, Hair Changes, Heat Intolerance, Hot flashes and New Diabetes. Hematology Present- Blood Thinners and Easy  Bruising. Not Present- Excessive bleeding, Gland problems, HIV and Persistent Infections.  Vitals (Tanisha A. Brown RMA; 11/04/2017 10:25 AM) 11/04/2017 10:24 AM Weight: 183.7 lb Height: 64in Body Surface Area: 1.89 m Body Mass Index: 31.53 kg/m  Temp.: 98.41F  Pulse: 90 (Regular)  BP: 132/86 (Sitting, Left Arm, Standard)   BP (!) 141/70   Pulse 70   Temp 97.9 F (36.6 C) (Oral)   Resp 18   Ht 5\' 4"  (1.626 m)   Wt 84.4 kg (186 lb)   SpO2 98%   BMI 31.93 kg/m      Physical Exam Morgan Hector MD; 11/04/2017 6:00 PM) General Mental Status-Alert. General Appearance-Not in acute distress, Not Sickly. Orientation-Oriented X3. Hydration-Well hydrated. Voice-Normal.  Integumentary Global Assessment Upon inspection and palpation of skin surfaces of the - Axillae: non-tender, no inflammation or ulceration, no drainage. and Distribution of scalp and body hair is normal. General Characteristics Temperature - normal warmth is noted.  Head and Neck Head-normocephalic, atraumatic with no lesions or palpable masses. Face Global Assessment - atraumatic, no absence of expression. Neck Global Assessment - no abnormal movements, no bruit auscultated on the right, no bruit auscultated on the left, no decreased range of motion, non-tender. Trachea-midline. Thyroid Gland Characteristics - non-tender.  Eye Eyeball - Left-Extraocular movements intact, No Nystagmus. Eyeball - Right-Extraocular movements intact, No Nystagmus. Cornea - Left-No Hazy. Cornea - Right-No Hazy. Sclera/Conjunctiva - Left-No scleral icterus, No Discharge. Sclera/Conjunctiva - Right-No scleral icterus, No Discharge. Pupil - Left-Direct reaction to light normal. Pupil - Right-Direct reaction to light normal.  ENMT Ears Pinna - Left - no drainage observed, no generalized tenderness observed. Right - no drainage observed, no generalized tenderness observed. Nose and  Sinuses External Inspection of the Nose - no destructive lesion observed. Inspection of the nares - Left - quiet respiration. Right - quiet respiration. Mouth and Throat Lips - Upper Lip - no fissures observed, no pallor noted. Lower Lip - no fissures observed, no pallor noted. Nasopharynx - no discharge present. Oral Cavity/Oropharynx - Tongue - no dryness observed. Oral Mucosa - no cyanosis observed. Hypopharynx - no evidence  of airway distress observed.  Chest and Lung Exam Inspection Movements - Normal and Symmetrical. Accessory muscles - No use of accessory muscles in breathing. Palpation Palpation of the chest reveals - Non-tender. Auscultation Breath sounds - Normal and Clear.  Cardiovascular Auscultation Rhythm - Regular. Murmurs & Other Heart Sounds - Auscultation of the heart reveals - No Murmurs and No Systolic Clicks.  Abdomen Inspection Inspection of the abdomen reveals - No Visible peristalsis and No Abnormal pulsations. Umbilicus - No Bleeding, No Urine drainage. Palpation/Percussion Palpation and Percussion of the abdomen reveal - Soft, Non Tender, No Rebound tenderness, No Rigidity (guarding) and No Cutaneous hyperesthesia. Note: Abdomen obese but soft. Nontender to exam. Not severely distended. No distasis recti. No umbilical or other anterior abdominal wall hernias   Female Genitourinary Sexual Maturity Tanner 5 - Adult hair pattern. Note: Nontender. No inguinal hernias. No inguinal lymphadenopathy. No major vaginal bleeding nor foul discharge   Rectal Note: deferred   Peripheral Vascular Upper Extremity Inspection - Left - No Cyanotic nailbeds, Not Ischemic. Right - No Cyanotic nailbeds, Not Ischemic.  Neurologic Neurologic evaluation reveals -normal attention span and ability to concentrate, able to name objects and repeat phrases. Appropriate fund of knowledge , normal sensation and normal coordination. Mental Status Affect - not angry, not  paranoid. Cranial Nerves-Normal Bilaterally. Gait-Normal.  Neuropsychiatric Mental status exam performed with findings of-able to articulate well with normal speech/language, rate, volume and coherence, thought content normal with ability to perform basic computations and apply abstract reasoning and no evidence of hallucinations, delusions, obsessions or homicidal/suicidal ideation.  Musculoskeletal Global Assessment Spine, Ribs and Pelvis - no instability, subluxation or laxity. Right Upper Extremity - no instability, subluxation or laxity.  Lymphatic Head & Neck  General Head & Neck Lymphatics: Bilateral - Description - No Localized lymphadenopathy. Axillary  General Axillary Region: Bilateral - Description - No Localized lymphadenopathy. Femoral & Inguinal  Generalized Femoral & Inguinal Lymphatics: Left - Description - No Localized lymphadenopathy. Right - Description - No Localized lymphadenopathy.    Assessment & Plan  DIVERTICULITIS OF LARGE INTESTINE WITH ABSCESS WITHOUT BLEEDING (K57.20) Impression: Gradually recovering in this immunosuppressed female with a diverticular abscess on bladder slowly with antibiotic.  Complete IV antibiotics. 4 weeks IV ertapenem per ID Dr Megan Salon. Follow-up with infectious disease Dr. Megan Salon next week. Obtain CAT scan just beforehand. See if the abscess is continuing to getting smaller or resolved.  At some point, she would benefit from segmental resection of the rectosigmoid colon containing the diverticulitis and abscess. 6 week point = later this month. Ideally would get colonoscopy beforehand given her history of colon polyps and her being due for colonoscopy anyway. She is due to get colonoscopy later this month, 12/19. Dr Henrene Pastor.  She is increased risk for leak and ostomy since she is immunosupressed. Hopefully since the abscess is getting smaller and she is feeling stronger, more likely that it is walled off and minimal so that  we can do one stage surgery. Holding immunosuppression to just 5mg  prednisone should help as well.  Ready for surgery  Current Plans Follow Up - Call [facility] office after tests / studies doneto discuss further plans Follow-up after evaluation by consultant [facility] (Z01.818) Current Plans You are being scheduled for surgery- Our schedulers will call you.  You should hear from our office's scheduling department within 5 working days about the location, date, and time of surgery. We try to make accommodations for  patient's preferences in scheduling surgery, but sometimes the OR schedule or the surgeon's schedule prevents [place] from making those accommodations.  If you have not heard from our office [phone]) in 5 working days, call the office and ask for your surgeon's nurse.  If you have other questions about your diagnosis, plan, or surgery, call the office and ask for your surgeon's nurse.  Written instructions provided The anatomy & physiology of the digestive tract was discussed. The pathophysiology of the colon was discussed. Natural history risks without surgery was discussed. I feel the risks of no intervention will lead to serious problems that outweigh the operative risks; therefore, I recommended a partial colectomy to remove the pathology. Minimally invasive (Robotic/Laparoscopic) & open techniques were discussed.  Risks such as bleeding, infection, abscess, leak, reoperation, possible ostomy, hernia, heart attack, death, and other risks were discussed. I noted a good likelihood this will help address the problem. Goals of post-operative recovery were discussed as well. Need for adequate nutrition, daily bowel regimen and healthy physical activity, to optimize recovery was noted as well. We will work to minimize complications. Educational materials were available as well. Questions were answered.  The patient expresses understanding & wishes to proceed with surgery.  Pt Education - [facility] Colon Bowel Prep 2018 ERAS/Miralax/Antibiotics Started Neomycin Sulfate 500MG , 2 (two) Tablet SEE NOTE, #6, 11/04/2017, No Refill. Local Order: TAKE TWO TABLETS AT 2 PM, 3 PM, AND 10 PM THE DAY PRIOR TO SURGERY Started Flagyl 500MG , 2 (two) Tablet SEE NOTE, #6, 11/04/2017, No Refill. Local Order: Take at 2pm, 3pm, and 10pm the day prior to your colon operation Pt Education - Pamphlet Given - Laparoscopic Colorectal Surgery: discussed with patient and provided information. Pt Education - [facility] Colectomy post-op instructions: discussed with patient and provided information.  Morgan [name], M.D., F.A.C.S. Gastrointestinal and Minimally Invasive Surgery [facility] [address] [place], [place] [address] [phone] Main / Paging

## 2017-12-11 NOTE — Op Note (Signed)
12/11/2017  11:33 AM  PATIENT:  Morgan [name]  67 y.o. female  Patient [name] Team: [name], MD as PCP - General (Family Medicine) Michael Boston, MD as Consulting Physician (General Surgery) Irene Shipper, MD as Consulting Physician (Gastroenterology) Wyatt Portela, MD as Consulting Physician (Hematology and Oncology) Michel Bickers, MD as Consulting Physician (Infectious Diseases) Gavin Pound, MD as Consulting Physician (Rheumatology)  PRE-OPERATIVE DIAGNOSIS:  sigmoid colon diverticulitis with abscess  POST-OPERATIVE DIAGNOSIS:  Rectosigmoid colon diverticulitis with abscess  PROCEDURE:   XI ROBOTIC ASSISTED LOW ANTERIOR RESECTION OF RECTOSIGMOID COLON BILATERAL SALPINGO OOPHORECTOMY RIDGED PROCTOSCOPY ROBOTIC LYSIS OF ADHESIONS X 75 MIN (1/3 CASE)  SURGEON:  [name], MD  ASSISTANT: Leighton Ruff, MD, FACS. An experienced assistant was required given the standard of surgical [name] given the complexity of the case.  This assistant was needed for exposure, dissection, suctioning, retraction, resection, anastomosis, instrument exchange, etc.  ANESTHESIA:   local and general  EBL:  Total I/O In: 1700 [I.V.:1700] Out: 225 [Urine:125; Blood:100]  Delay start of Pharmacological VTE agent (>24hrs) due to surgical blood loss or risk of bleeding:  no  DRAINS: (19Fr) Blake drain(s) in the PELVIS   SPECIMEN:   RECTOSIGMOID COLON Left & right adnexae  DISPOSITION OF SPECIMEN:  PATHOLOGY  COUNTS:  YES  PLAN OF [name]: Admit to inpatient   PATIENT DISPOSITION:  PACU - hemodynamically stable.  INDICATION:    Chronically immunosuppressed patient for severe rheumatoid arthritis who developed pain and had rectosigmoid abscess and perforation.  Workup consistent with diverticulitis.  Not amenable to percutaneous drainage.  Gradually improved with IV antibiotics and holding depression.  Endoscopy ruled out malignancy.  Given the severe nature of the inflamed region  and persistent fluid collection, I recommended segmental resection:  The anatomy & physiology of the digestive tract was discussed.  The pathophysiology was discussed.  Natural history risks without surgery was discussed.   I worked to give an overview of the disease and the frequent need to have multispecialty involvement.  I feel the risks of no intervention will lead to serious problems that outweigh the operative risks; therefore, I recommended a partial colectomy to remove the pathology.  Laparoscopic & open techniques were discussed.   Risks such as bleeding, infection, abscess, leak, reoperation, possible ostomy, hernia, heart attack, death, and other risks were discussed.  I noted a good likelihood this will help address the problem.   Goals of post-operative recovery were discussed as well.  We will work to minimize complications.  Educational materials on the pathology had been given in the office.  Questions were answered.    The patient expressed understanding & wished to proceed with surgery.  OR FINDINGS:   Patient had very inflamed rectosigmoid colon going down to mid rectum.  Left adnexa obliterated and densely adherent to it right adnexa moderately adherent as well.  Dense adhesions to left distal ureter bladder and vaginal cuff.  No obvious metastatic disease on visceral parietal peritoneum or liver.  The anastomosis rests 10 cm from the anal verge by rigid proctoscopy.  DESCRIPTION:   Informed consent was confirmed.  The patient underwent general anaesthesia without difficulty.  The patient was positioned appropriately.  VTE prevention in place.  The patient's abdomen was clipped, prepped, & draped in a sterile fashion.  Surgical timeout confirmed our plan.  The patient was positioned in reverse Trendelenburg.  Abdominal entry was gained using Varess technique in the left upper abdomen.  Entry was clean.  I induced carbon dioxide insufflation.  Camera inspection revealed no  injury.  8 mm robotic port placed in the right upper abdomen.  Extra ports were carefully placed under direct laparoscopic visualization.  Gets easily see a shortened sigmoid colon very inflamed.  Dense adhesions on the left pelvic brim and bladder.  Anterior pelvis obliterated with inflammation and adhesions.  I reflected the greater omentum and the upper abdomen the small bowel in the upper abdomen.  The patient was carefully positioned.  The Intuitive daVinci robot was carefully docked with camera & instruments carefully placed.  The patient had very inflamed rectosigmoid colon.  I began to mobilize the left colon in a lateral medial fashion starting the mid descending colon and inferiorly.  Used focused sharp dissection with robotic scissors.  We freed some adhesions of the inflamed colon off the left pelvic brim.  Down to the adnexa.  Rather obliterated.  Came into a clear fluid pocket in the left anterior pelvis.  Seem consistent with an ovarian cyst.  Adhesions became more dense on the mid lateral pelvic wall, so I decided to come medial lateral for further dissection.   I scored the base of peritoneum of the medial side of the mesentery of the left colon from the ligament of Treitz to the peritoneal reflection of the mid rectum.   I elevated the sigmoid mesentery and entered into the retro-mesenteric plane. We were able to identify the left ureter and gonadal vessels. We kept those posterior within the retroperitoneum and elevated the left colon mesentery off that. I did isolate the inferior mesenteric artery (IMA) pedicle but did not ligate it yet.  I continued distally and got into the avascular plane posterior to the mesorectum. This allowed me to help mobilize the rectum as well by freeing the mesorectum off the sacrum.  I mobilized the peritoneal coverings towards the peritoneal reflection on both the right and left sides of the rectum.  I stayed away from the right and left ureters.  I kept the  lateral vascular pedicles to the rectum intact.  I worked to mobilize the rectosigmoid mesentery off the left pelvic brim.  I skeletonized the lymph nodes off the inferior mesenteric artery pedicle.  I went down to its takeoff from the aorta.  I isolated the inferior mesenteric vein off of the ligament of Treitz just cephalad to that as well.  After confirming the left ureter was out of the way, I went ahead and ligated the inferior mesenteric artery pedicle just near its takeoff from the aorta.  I did thorough retroperitoneal mobilization of the whole left colon up to the splenic flexure and to free adhesions off the left kidney as well.  Mobilized the left colon lateral to medial fashion up towards the splenic flexure.  Got better mobility, so I do not feel that we had to more aggressively ligate the inferior mesenteric vein.  With better left colon and rectosigmoid mobilization I could finally free off the rectosigmoid colon off the left pelvic brim.  The fallopian tube and ovary rather obliterated and densely adherent to the rectosigmoid mesentery already, so therefore ended up resecting it after carefully skeletonized and transecting off the ovarian vessels & bladder.  Encountered a small pocket of necrotic fat anterior midline.  Easily aspirated.  Correlated with the prior fluid collection consistent with abscess.  Gradually we were able to circumferentially mobilize the rectosigmoid colon off the pelvis.  Right adnexa rather adherent to being transected off as well after  carefully skeletonizing the ovarian pedicle and staying away from the right ureter.   Sigmoid colon was quite shrunken from the inflammation.  No sigmoid curve but rather straight.    We irrigated closely copiously.  I assured hemostasis with bipolar cautery.  We had the bladder infiltrated with methylene blue-colered isotonic fluid.  Distended to 350 mL with good bladder distention.  Moderate inflation scarring on the left posterior  dome but no evidence of any leak anywhere.  Inspected the vaginal cuff.  Some scarring and mild narrowing.  Freed off the rectal cuff on the anterior pelvic brim and anterior peritoneal reflection.  That allowed [place] more mobility of the rectum..  We ensured hemostasis. I skeletonized the mesorectum at the junction at the proximal rectum for the distal point of resection.  I mobilized the left colon in a lateral to medial fashion off the line of Toldt up towards the splenic flexure to ensure good mobilization of the remaining left colon to reach into the pelvis.  I skeletonized & transected mesorectum and transected at the proximal/mid rectum using a robotic 45 mm stapler through a 12 mm port in the suprapubic midline region.  95% across the first firing.  One extra firing for the left lateral corner.  I chose a region at the descending/sigmoid junction that was soft and easily reached down to the rectal stump.  I transected the mesentery of the colon radially to preserve remaining colon blood supply.  Did irrigation.  Hemostasis was improved.  I created an extraction incision through a small Pfannenstiel incision in the suprapubic region.  Placed a wound protector.  I was able to eviscerate the rectosigmoid and descending colon out the wound.   I clamped the colon proximal to this area using a reusable pursestringer device.  Passed a 2-0 Keith needle. I transected at the descending/sigmoid junction with a scalpel. I got healthy bleeding mucosa.  We sent the rectosigmoid colon specimen off to go to pathology.  We sized the colon orifice.  I chose a 33 EEA anvil stapler system.  I reinforced the prolene pursestring with interrupted silk suture.  I placed the anvil to the open end of the proximal remaining colon and closed around it using the pursestring.    We did copious irrigation with crystalloid solution.  Hemostasis was good.  The distal end of the remaining colon easily reached down to the rectal stump,  therefore, splenic flexure mobilization was not needed.      Dr Marcello Moores scrubbed down and did gentle anal dilation with blunt tipped EEA sizers and eventually advanced the EEA stapler up the rectal stump. The spike was brought out at the provimal end of the rectal stump under direct visualization.  I attached the anvil of the proximal colon the spike of the stapler. Anvil was tightened down and held clamped for 60 seconds. The EEA stapler was fired and held clamped for 30 seconds. The stapler was released & removed. We noted an excellent proximal ring.  Rectal ring was thin anteriorly but intact.  Blue stitch is in the proximal ring.  Dr Marcello Moores did rigid proctoscopy noted the anastomosis was at 10 cm from the anal verge consistent with the proximal rectum.  Some oozing at the staple line at first but calmed down.  We did a final irrigation of antibiotic solution (900 mg clindamycin/240 mg gentamicin in a liter of crystalloid) & held that for the pelvic air leak test .  The rectum was insufflated the rectum while  clamping the colon proximal to that anastomosis.  There was a negative air leak test. There was no tension of mesentery or bowel at the anastomosis.   Tissues looked viable.  Ureters & bowel uninjured.  The anastomosis looked healthy.  I placed a drain given the fluid pockets and bloody case.  Endoluminal gas was evacuated.  Ports & wound protector removed.  We changed gloves & redraped the patient per colon SSI prevention protocol.  We aspirated the antibiotic irrigation.  Hemostasis was good.  Sterile unused instruments were used from this point.  I closed the skin at the port sites using Monocryl stitch and sterile dressing.  I closed the extraction wound using a 0 Vicryl vertical peritoneal closure and a #1 PDS transverse anterior rectal fascial closure like a small Pfannenstiel closure. I closed the skin with some interrupted Monocryl stitches. I placed antibiotic-soaked wicks into the closure at the  corners x2.  I placed sterile dressings.     Patient is being extubated go to recovery room. I had discussed postop [name] with the patient in detail the office & in the holding area with the patient and her husband. Instructions are written.  I discussed operative findings, updated the patient's status, discussed probable steps to recovery, and gave postoperative recommendations to the patient's family.  Recommendations were made.  Questions were answered.  They expressed understanding & appreciation.   [name], M.D., F.A.C.S. Gastrointestinal and Minimally Invasive Surgery [facility] [address], [place], [place] [address] [phone] Main / Paging

## 2017-12-11 NOTE — Anesthesia Postprocedure Evaluation (Signed)
Anesthesia Post Note  Patient: Morgan [name]  Procedure(s) Performed: XI ROBOTIC ASSISTED LOW ANTERIOR RESECTION OF RECTOSIGMOID COLON WITH BILATERAL SALPINGO OOPHORECTOMY (N/A Abdomen) RIDGED PROCTOSCOPY (N/A Rectum) LAPAROSCOPIC LYSIS OF ADHESIONS (N/A Abdomen)     Patient location during evaluation: PACU Anesthesia Type: General Level of consciousness: awake and alert Pain management: pain level controlled Vital Signs Assessment: post-procedure vital signs reviewed and stable Respiratory status: spontaneous breathing, nonlabored ventilation, respiratory function stable and patient connected to nasal cannula oxygen Cardiovascular status: blood pressure returned to baseline and stable Postop Assessment: no apparent nausea or vomiting Anesthetic complications: no    Last Vitals:  Vitals:   12/11/17 1430 12/11/17 1445  BP: 118/67 (!) 147/72  Pulse:  82  Resp:  16  Temp: (!) 36.4 C 36.4 C  SpO2:  100%    Last Pain:  Vitals:   12/11/17 1430  TempSrc:   PainSc: 3                  Barnet Glasgow

## 2017-12-11 NOTE — OR Nursing (Signed)
I had to reinsert a new foley due to leakage at foley port site on foley bag tubing.  Mohammed Kindle, RN, BSN 3

## 2017-12-11 NOTE — Discharge Instructions (Signed)
SURGERY: POST OP INSTRUCTIONS °(Surgery for small bowel obstruction, colon resection, etc) ° ° °###################################################################### ° °EAT °Gradually transition to a high fiber diet with a fiber supplement over the next few days after discharge ° °WALK °Walk an hour a day.  Control your pain to do that.   ° °CONTROL PAIN °Control pain so that you can walk, sleep, tolerate sneezing/coughing, go up/down stairs. ° °HAVE A BOWEL MOVEMENT DAILY °Keep your bowels regular to avoid problems.  OK to try a laxative to override constipation.  OK to use an antidairrheal to slow down diarrhea.  Call if not better after 2 tries ° °CALL IF YOU HAVE PROBLEMS/CONCERNS °Call if you are still struggling despite following these instructions. °Call if you have concerns not answered by these instructions ° °###################################################################### ° ° °DIET °Follow a light diet the first few days at home.  Start with a bland diet such as soups, liquids, starchy foods, low fat foods, etc.  If you feel full, bloated, or constipated, stay on a ful liquid or pureed/blenderized diet for a few days until you feel better and no longer constipated. °Be sure to drink plenty of fluids every day to avoid getting dehydrated (feeling dizzy, not urinating, etc.). °Gradually add a fiber supplement to your diet over the next week.  Gradually get back to a regular solid diet.  Avoid fast food or heavy meals the first week as you are more likely to get nauseated. °It is expected for your digestive tract to need a few months to get back to normal.  It is common for your bowel movements and stools to be irregular.  You will have occasional bloating and cramping that should eventually fade away.  Until you are eating solid food normally, off all pain medications, and back to regular activities; your bowels will not be normal. °Focus on eating a low-fat, high fiber diet the rest of your life  (See Getting to Good Bowel Health, below). ° °CARE of your INCISION or WOUND °It is good for closed incision and even open wounds to be washed every day.  Shower every day.  Short baths are fine.  Wash the incisions and wounds clean with soap & water.    °If you have a closed incision(s), wash the incision with soap & water every day.  You may leave closed incisions open to air if it is dry.   You may cover the incision with clean gauze & replace it after your daily shower for comfort. °If you have skin tapes (Steristrips) or skin glue (Dermabond) on your incision, leave them in place.  They will fall off on their own like a scab.  You may trim any edges that curl up with clean scissors.  If you have staples, set up an appointment for them to be removed in the office in 10 days after surgery.  °If you have a drain, wash around the skin exit site with soap & water and place a new dressing of gauze or band aid around the skin every day.  Keep the drain site clean & dry.    °If you have an open wound with packing, see wound care instructions.  In general, it is encouraged that you remove your dressing and packing, shower with soap & water, and replace your dressing once a day.  Pack the wound with clean gauze moistened with normal (0.9%) saline to keep the wound moist & uninfected.  Pressure on the dressing for 30 minutes will stop most wound   bleeding.  Eventually your body will heal & pull the open wound closed over the next few months.  °Raw open wounds will occasionally bleed or secrete yellow drainage until it heals closed.  Drain sites will drain a little until the drain is removed.  Even closed incisions can have mild bleeding or drainage the first few days until the skin edges scab over & seal.   °If you have an open wound with a wound vac, see wound vac care instructions. ° ° ° ° °ACTIVITIES as tolerated °Start light daily activities --- self-care, walking, climbing stairs-- beginning the day after surgery.   Gradually increase activities as tolerated.  Control your pain to be active.  Stop when you are tired.  Ideally, walk several times a day, eventually an hour a day.   °Most people are back to most day-to-day activities in a few weeks.  It takes 4-8 weeks to get back to unrestricted, intense activity. °If you can walk 30 minutes without difficulty, it is safe to try more intense activity such as jogging, treadmill, bicycling, low-impact aerobics, swimming, etc. °Save the most intensive and strenuous activity for last (Usually 4-8 weeks after surgery) such as sit-ups, heavy lifting, contact sports, etc.  Refrain from any intense heavy lifting or straining until you are off narcotics for pain control.  You will have off days, but things should improve week-by-week. °DO NOT PUSH THROUGH PAIN.  Let pain be your guide: If it hurts to do something, don't do it.  Pain is your body warning you to avoid that activity for another week until the pain goes down. °You may drive when you are no longer taking narcotic prescription pain medication, you can comfortably wear a seatbelt, and you can safely make sudden turns/stops to protect yourself without hesitating due to pain. °You may have sexual intercourse when it is comfortable. If it hurts to do something, stop. ° °MEDICATIONS °Take your usually prescribed home medications unless otherwise directed.   °Blood thinners:  °Usually you can restart any strong blood thinners after the second postoperative day.  It is OK to take aspirin right away.    ° If you are on strong blood thinners (warfarin/Coumadin, Plavix, Xerelto, Eliquis, Pradaxa, etc), discuss with your surgeon, medicine PCP, and/or cardiologist for instructions on when to restart the blood thinner & if blood monitoring is needed (PT/INR blood check, etc).   ° ° °PAIN CONTROL °Pain after surgery or related to activity is often due to strain/injury to muscle, tendon, nerves and/or incisions.  This pain is usually  short-term and will improve in a few months.  °To help speed the process of healing and to get back to regular activity more quickly, DO THE FOLLOWING THINGS TOGETHER: °1. Increase activity gradually.  DO NOT PUSH THROUGH PAIN °2. Use Ice and/or Heat °3. Try Gentle Massage and/or Stretching °4. Take over the counter pain medication °5. Take Narcotic prescription pain medication for more severe pain ° °Good pain control = faster recovery.  It is better to take more medicine to be more active than to stay in bed all day to avoid medications. °1.  Increase activity gradually °Avoid heavy lifting at first, then increase to lifting as tolerated over the next 6 weeks. °Do not “push through” the pain.  Listen to your body and avoid positions and maneuvers than reproduce the pain.  Wait a few days before trying something more intense °Walking an hour a day is encouraged to help your body recover faster   and more safely.  Start slowly and stop when getting sore.  If you can walk 30 minutes without stopping or pain, you can try more intense activity (running, jogging, aerobics, cycling, swimming, treadmill, sex, sports, weightlifting, etc.) °Remember: If it hurts to do it, then don’t do it! °2. Use Ice and/or Heat °You will have swelling and bruising around the incisions.  This will take several weeks to resolve. °Ice packs or heating pads (6-8 times a day, 30-60 minutes at a time) will help sooth soreness & bruising. °Some people prefer to use ice alone, heat alone, or alternate between ice & heat.  Experiment and see what works best for you.  Consider trying ice for the first few days to help decrease swelling and bruising; then, switch to heat to help relax sore spots and speed recovery. °Shower every day.  Short baths are fine.  It feels good!  Keep the incisions and wounds clean with soap & water.   °3. Try Gentle Massage and/or Stretching °Massage at the area of pain many times a day °Stop if you feel pain - do not  overdo it °4. Take over the counter pain medication °This helps the muscle and nerve tissues become less irritable and calm down faster °Choose ONE of the following over-the-counter anti-inflammatory medications: °Acetaminophen 500mg tabs (Tylenol) 1-2 pills with every meal and just before bedtime (avoid if you have liver problems or if you have acetaminophen in you narcotic prescription) °Naproxen 220mg tabs (ex. Aleve, Naprosyn) 1-2 pills twice a day (avoid if you have kidney, stomach, IBD, or bleeding problems) °Ibuprofen 200mg tabs (ex. Advil, Motrin) 3-4 pills with every meal and just before bedtime (avoid if you have kidney, stomach, IBD, or bleeding problems) °Take with food/snack several times a day as directed for at least 2 weeks to help keep pain / soreness down & more manageable. °5. Take Narcotic prescription pain medication for more severe pain °A prescription for strong pain control is often given to you upon discharge (for example: oxycodone/Percocet, hydrocodone/Norco/Vicodin, or tramadol/Ultram) °Take your pain medication as prescribed. °Be mindful that most narcotic prescriptions contain Tylenol (acetaminophen) as well - avoid taking too much Tylenol. °If you are having problems/concerns with the prescription medicine (does not control pain, nausea, vomiting, rash, itching, etc.), please call us [phone] to see if we need to switch you to a different pain medicine that will work better for you and/or control your side effects better. °If you need a refill on your pain medication, you must call the office before 4 pm and on weekdays only.  By federal law, prescriptions for narcotics cannot be called into a pharmacy.  They must be filled out on paper & picked up from our office by the patient or authorized caretaker.  Prescriptions cannot be filled after 4 pm nor on weekends.   ° °WHEN TO CALL US [phone] °Severe uncontrolled or worsening pain  °Fever over 101 F (38.5 C) °Concerns with  the incision: Worsening pain, redness, rash/hives, swelling, bleeding, or drainage °Reactions / problems with new medications (itching, rash, hives, nausea, etc.) °Nausea and/or vomiting °Difficulty urinating °Difficulty breathing °Worsening fatigue, dizziness, lightheadedness, blurred vision °Other concerns °If you are not getting better after two weeks or are noticing you are getting worse, contact our office [phone] for further advice.  We may need to adjust your medications, re-evaluate you in the office, send you to the emergency room, or see what other things we can do to help. °The   clinic staff is available to answer your questions during regular business hours (8:30am-5pm).  Please don’t hesitate to call and ask to speak to one of our nurses for clinical concerns.    °A surgeon from [facility] is always on call at the hospitals 24 hours/day °If you have a medical emergency, go to the nearest emergency room or call 911. ° °FOLLOW UP in our office °One the day of your discharge from the hospital (or the next business weekday), please call [facility] to set up or confirm an appointment to see your surgeon in the office for a follow-up appointment.  Usually it is 2-3 weeks after your surgery.   °If you have skin staples at your incision(s), let the office know so we can set up a time in the office for the nurse to remove them (usually around 10 days after surgery). °Make sure that you call for appointments the day of discharge (or the next business weekday) from the hospital to ensure a convenient appointment time. °IF YOU HAVE DISABILITY OR FAMILY LEAVE FORMS, BRING THEM TO THE OFFICE FOR PROCESSING.  DO NOT GIVE THEM TO YOUR DOCTOR. ° °[facility] °[address], [address], [place],   [address] ? °[phone] - Main °[phone] - Toll Free,  [phone] - Fax °[email or web address] ° °GETTING TO GOOD BOWEL HEALTH. °It is  expected for your digestive tract to need a few months to get back to normal.  It is common for your bowel movements and stools to be irregular.  You will have occasional bloating and cramping that should eventually fade away.  Until you are eating solid food normally, off all pain medications, and back to regular activities; your bowels will not be normal.   °Avoiding constipation °The goal: ONE SOFT BOWEL MOVEMENT A DAY!    °Drink plenty of fluids.  Choose water first. °TAKE A FIBER SUPPLEMENT EVERY DAY THE REST OF YOUR LIFE °During your first week back home, gradually add back a fiber supplement every day °Experiment which form you can tolerate.   There are many forms such as powders, tablets, wafers, gummies, etc °Psyllium bran (Metamucil), methylcellulose (Citrucel), Miralax or Glycolax, Benefiber, Flax Seed.  °Adjust the dose week-by-week (1/2 dose/day to 6 doses a day) until you are moving your bowels 1-2 times a day.  Cut back the dose or try a different fiber product if it is giving you problems such as diarrhea or bloating. °Sometimes a laxative is needed to help jump-start bowels if constipated until the fiber supplement can help regulate your bowels.  If you are tolerating eating & you are farting, it is okay to try a gentle laxative such as double dose MiraLax, prune juice, or Milk of Magnesia.  Avoid using laxatives too often. °Stool softeners can sometimes help counteract the constipating effects of narcotic pain medicines.  It can also cause diarrhea, so avoid using for too long. °If you are still constipated despite taking fiber daily, eating solids, and a few doses of laxatives, call our office. °Controlling diarrhea °Try drinking liquids and eating bland foods for a few days to avoid stressing your intestines further. °Avoid dairy products (especially milk & ice cream) for a short time.  The intestines often can lose the ability to digest lactose when stressed. °Avoid foods that cause gassiness or  bloating.  Typical foods include beans and other legumes, cabbage, broccoli, and dairy foods.  Avoid greasy, spicy, fast foods.  Every person has   some sensitivity to other foods, so listen to your body and avoid those foods that trigger problems for you. °Probiotics (such as active yogurt, Align, etc) may help repopulate the intestines and colon with normal bacteria and calm down a sensitive digestive tract °Adding a fiber supplement gradually can help thicken stools by absorbing excess fluid and retrain the intestines to act more normally.  Slowly increase the dose over a few weeks.  Too much fiber too soon can backfire and cause cramping & bloating. °It is okay to try and slow down diarrhea with a few doses of antidiarrheal medicines.   °Bismuth subsalicylate (ex. Kayopectate, Pepto Bismol) for a few doses can help control diarrhea.  Avoid if pregnant.   °Loperamide (Imodium) can slow down diarrhea.  Start with one tablet (2mg) first.  Avoid if you are having fevers or severe pain.  °ILEOSTOMY PATIENTS WILL HAVE CHRONIC DIARRHEA since their colon is not in use.    °Drink plenty of liquids.  You will need to drink even more glasses of water/liquid a day to avoid getting dehydrated. °Record output from your ileostomy.  Expect to empty the bag every 3-4 hours at first.  Most people with a permanent ileostomy empty their bag 4-6 times at the least.   °Use antidiarrheal medicine (especially Imodium) several times a day to avoid getting dehydrated.  Start with a dose at bedtime & breakfast.  Adjust up or down as needed.  Increase antidiarrheal medications as directed to avoid emptying the bag more than 8 times a day (every 3 hours). °Work with your wound ostomy nurse to learn care for your ostomy.  See ostomy care instructions. °TROUBLESHOOTING IRREGULAR BOWELS °1) Start with a soft & bland diet. No spicy, greasy, or fried foods.  °2) Avoid gluten/wheat or dairy products from diet to see if symptoms improve. °3) Miralax  17gm or flax seed mixed in 8oz. water or juice-daily. May use 2-4 times a day as needed. °4) Gas-X, Phazyme, etc. as needed for gas & bloating.  °5) Prilosec (omeprazole) over-the-counter as needed °6)  Consider probiotics (Align, Activa, etc) to help calm the bowels down ° °Call your doctor if you are getting worse or not getting better.  Sometimes further testing (cultures, endoscopy, X-ray studies, CT scans, bloodwork, etc.) may be needed to help diagnose and treat the cause of the diarrhea. °[facility] °[address], [address], [place], [place]  [address] °[phone] - Main.    °[phone]  - Toll Free.   [phone] - Fax °[email or web address] ° ° °Diverticulitis °Diverticulitis is infection or inflammation of small pouches (diverticula) in the colon that form due to a condition called diverticulosis. Diverticula can trap stool (feces) and bacteria, causing infection and inflammation. °Diverticulitis may cause severe stomach pain and diarrhea. It may lead to tissue damage in the colon that causes bleeding. The diverticula may also burst (rupture) and cause infected stool to enter other areas of the abdomen. °Complications of diverticulitis can include: °· Bleeding. °· Severe infection. °· Severe pain. °· Rupture (perforation) of the colon. °· Blockage (obstruction) of the colon. ° °What are the causes? °This condition is caused by stool becoming trapped in the diverticula, which allows bacteria to grow in the diverticula. This leads to inflammation and infection. °What increases the risk? °You are more likely to develop this condition if: °· You have diverticulosis. The risk for diverticulosis increases if: °? You are overweight or obese. °? You use tobacco products. °? You do   not get enough exercise. °· You eat a diet that does not include enough fiber. High-fiber foods include fruits, vegetables, beans, nuts, and whole grains. ° °What are the signs or  symptoms? °Symptoms of this condition may include: °· Pain and tenderness in the abdomen. The pain is normally located on the left side of the abdomen, but it may occur in other areas. °· Fever and chills. °· Bloating. °· Cramping. °· Nausea. °· Vomiting. °· Changes in bowel routines. °· Blood in your stool. ° °How is this diagnosed? °This condition is diagnosed based on: °· Your medical history. °· A physical exam. °· Tests to make sure there is nothing else causing your condition. These tests may include: °? Blood tests. °? Urine tests. °? Imaging tests of the abdomen, including X-rays, ultrasounds, MRIs, or CT scans. ° °How is this treated? °Most cases of this condition are mild and can be treated at home. Treatment may include: °· Taking over-the-counter pain medicines. °· Following a clear liquid diet. °· Taking antibiotic medicines by mouth. °· Rest. ° °More severe cases may need to be treated at a hospital. Treatment may include: °· Not eating or drinking. °· Taking prescription pain medicine. °· Receiving antibiotic medicines through an IV tube. °· Receiving fluids and nutrition through an IV tube. °· Surgery. ° °When your condition is under control, your health care provider may recommend that you have a colonoscopy. This is an exam to look at the entire large intestine. During the exam, a lubricated, bendable tube is inserted into the anus and then passed into the rectum, colon, and other parts of the large intestine. A colonoscopy can show how severe your diverticula are and whether something else may be causing your symptoms. °Follow these instructions at home: °Medicines °· Take over-the-counter and prescription medicines only as told by your health care provider. These include fiber supplements, probiotics, and stool softeners. °· If you were prescribed an antibiotic medicine, take it as told by your health care provider. Do not stop taking the antibiotic even if you start to feel better. °· Do not  drive or use heavy machinery while taking prescription pain medicine. °General instructions °· Follow a full liquid diet or another diet as directed by your health care provider. After your symptoms improve, your health care provider may tell you to change your diet. He or she may recommend that you eat a diet that contains at least 25 g (25 grams) of fiber daily. Fiber makes it easier to pass stool. Healthy sources of fiber include: °? Berries. One cup contains 4-8 grams of fiber. °? Beans or lentils. One half cup contains 5-8 grams of fiber. °? Green vegetables. One cup contains 4 grams of fiber. °· Exercise for at least 30 minutes, 3 times each week. You should exercise hard enough to raise your heart rate and break a sweat. °· Keep all follow-up visits as told by your health care provider. This is important. You may need a colonoscopy. °Contact a health care provider if: °· Your pain does not improve. °· You have a hard time drinking or eating food. °· Your bowel movements do not return to normal. °Get help right away if: °· Your pain gets worse. °· Your symptoms do not get better with treatment. °· Your symptoms suddenly get worse. °· You have a fever. °· You vomit more than one time. °· You have stools that are bloody, black, or tarry. °Summary °· Diverticulitis is infection or inflammation of small pouches (  diverticula) in the colon that form due to a condition called diverticulosis. Diverticula can trap stool (feces) and bacteria, causing infection and inflammation. °· You are at higher risk for this condition if you have diverticulosis and you eat a diet that does not include enough fiber. °· Most cases of this condition are mild and can be treated at home. More severe cases may need to be treated at a hospital. °· When your condition is under control, your health care provider may recommend that you have an exam called a colonoscopy. This exam can show how severe your diverticula are and whether something  else may be causing your symptoms. °This information is not intended to replace advice given to you by your health care provider. Make sure you discuss any questions you have with your health care provider. °Document Released: 08/29/2005 Document Revised: 12/22/2016 Document Reviewed: 12/22/2016 °Elsevier Interactive Patient Education © 2018 [employer] ° °

## 2017-12-11 NOTE — Anesthesia Procedure Notes (Signed)
Procedure Name: Intubation Date/Time: 12/11/2017 8:40 AM Performed by: Montel Clock, CRNA Pre-anesthesia Checklist: Patient identified, Emergency Drugs available, Suction available, Patient being monitored and Timeout performed Patient Re-evaluated:Patient Re-evaluated prior to induction Oxygen Delivery Method: Circle system utilized Preoxygenation: Pre-oxygenation with 100% oxygen Induction Type: IV induction Ventilation: Mask ventilation without difficulty and Oral airway inserted - appropriate to patient size Laryngoscope Size: Mac and 3 Grade View: Grade II Tube type: Oral Tube size: 7.0 mm Number of attempts: 1 Airway Equipment and Method: Stylet Placement Confirmation: ETT inserted through vocal cords under direct vision,  positive ETCO2 and breath sounds checked- equal and bilateral Secured at: 21 cm Tube secured with: Tape Dental Injury: Teeth and Oropharynx as per pre-operative assessment  Comments: Grade 2 view with downward laryngeal pressure. Easy mask.

## 2017-12-11 NOTE — Transfer of Care (Signed)
Immediate Anesthesia Transfer of Care Note  Patient: Morgan [name]  Procedure(s) Performed: XI ROBOTIC ASSISTED LOW ANTERIOR RESECTION OF RECTOSIGMOID COLON WITH BILATERAL SALPINGO OOPHORECTOMY (N/A Abdomen) RIDGED PROCTOSCOPY (N/A Rectum) LAPAROSCOPIC LYSIS OF ADHESIONS (N/A Abdomen)  Patient Location: PACU  Anesthesia Type:General  Level of Consciousness: sedated and patient cooperative  Airway & Oxygen Therapy: Patient Spontanous Breathing and Patient connected to face mask oxygen  Post-op Assessment: Report given to RN and Post -op Vital signs reviewed and stable  Post vital signs: Reviewed and stable  Last Vitals:  Vitals:   12/11/17 0709  BP: (!) 141/70  Pulse: 70  Resp: 18  Temp: 36.6 C  SpO2: 98%    Last Pain:  Vitals:   12/11/17 0731  TempSrc:   PainSc: 1          Complications: No apparent anesthesia complications

## 2017-12-12 ENCOUNTER — Other Ambulatory Visit: Payer: Self-pay

## 2017-12-12 LAB — CBC
HCT: 30.5 % — ABNORMAL LOW (ref 36.0–46.0)
Hemoglobin: 9.9 g/dL — ABNORMAL LOW (ref 12.0–15.0)
MCH: 29.7 pg (ref 26.0–34.0)
MCHC: 32.5 g/dL (ref 30.0–36.0)
MCV: 91.6 fL (ref 78.0–100.0)
PLATELETS: 151 10*3/uL (ref 150–400)
RBC: 3.33 MIL/uL — ABNORMAL LOW (ref 3.87–5.11)
RDW: 15.5 % (ref 11.5–15.5)
WBC: 32.7 10*3/uL — AB (ref 4.0–10.5)

## 2017-12-12 LAB — BASIC METABOLIC PANEL
Anion gap: 8 (ref 5–15)
BUN: 11 mg/dL (ref 6–20)
CALCIUM: 9 mg/dL (ref 8.9–10.3)
CO2: 27 mmol/L (ref 22–32)
CREATININE: 0.79 mg/dL (ref 0.44–1.00)
Chloride: 105 mmol/L (ref 101–111)
GFR calc Af Amer: >60 mL/min (ref >60)
GFR calc non Af Amer: >60 mL/min (ref >60)
Glucose, Bld: 91 mg/dL (ref 65–99)
Potassium: 3.7 mmol/L (ref 3.5–5.1)
SODIUM: 140 mmol/L (ref 135–145)

## 2017-12-12 LAB — MAGNESIUM: Magnesium: 1.6 mg/dL — ABNORMAL LOW (ref 1.7–2.4)

## 2017-12-12 MED ORDER — SODIUM CHLORIDE 0.9 % IV SOLN: 250.0000 mL | INTRAVENOUS | Status: DC | PRN

## 2017-12-12 MED ORDER — SODIUM CHLORIDE 0.9% FLUSH: 3.0000 mL | INTRAVENOUS | Status: DC | PRN

## 2017-12-12 MED ORDER — TRAMADOL HCL 50 MG PO TABS
50.0000 mg | ORAL_TABLET | Freq: Four times a day (QID) | ORAL | Status: DC | PRN
  Administered 2017-12-12: 100 mg via ORAL
  Administered 2017-12-12: 50 mg via ORAL
  Administered 2017-12-12 – 2017-12-15 (×7): 100 mg via ORAL
  Filled 2017-12-12: qty 1
  Filled 2017-12-12: qty 2
  Filled 2017-12-12: qty 1
  Filled 2017-12-12 (×2): qty 2
  Filled 2017-12-12: qty 1
  Filled 2017-12-12 (×4): qty 2

## 2017-12-12 MED ORDER — MAGNESIUM SULFATE 2 GM/50ML IV SOLN
2.0000 g | Freq: Once | INTRAVENOUS | Status: AC
  Administered 2017-12-12: 2 g via INTRAVENOUS
  Filled 2017-12-12: qty 50

## 2017-12-12 MED ORDER — METHOCARBAMOL 500 MG PO TABS
1000.0000 mg | ORAL_TABLET | Freq: Four times a day (QID) | ORAL | Status: DC | PRN
  Administered 2017-12-14 – 2017-12-15 (×2): 1000 mg via ORAL
  Filled 2017-12-12 (×2): qty 2

## 2017-12-12 MED ORDER — METHOCARBAMOL 1000 MG/10ML IJ SOLN
1000.0000 mg | Freq: Four times a day (QID) | INTRAVENOUS | Status: DC | PRN
  Administered 2017-12-12: 1000 mg via INTRAVENOUS
  Filled 2017-12-12: qty 10

## 2017-12-12 MED ORDER — SODIUM CHLORIDE 0.9% FLUSH
3.0000 mL | Freq: Two times a day (BID) | INTRAVENOUS | Status: DC
  Administered 2017-12-12 – 2017-12-16 (×7): 3 mL via INTRAVENOUS

## 2017-12-12 MED ORDER — ADULT MULTIVITAMIN W/MINERALS CH
1.0000 | ORAL_TABLET | Freq: Every day | ORAL | Status: DC
  Administered 2017-12-12 – 2017-12-17 (×6): 1 via ORAL
  Filled 2017-12-12 (×6): qty 1

## 2017-12-12 NOTE — Progress Notes (Signed)
[address] [place], [place] [address] Phone: [phone]  FAX: [place] Sch[place]auer [phone] Apr 18, 1951  CARE TEAM:  PCP: [name], MD  Outpatient Care Team: Patient Care Team: [name], MD as PCP - General (Family Medicine) Michael Boston, MD as Consulting Physician (General Surgery) Irene Shipper, MD as Consulting Physician (Gastroenterology) Wyatt Portela, MD as Consulting Physician (Hematology and Oncology) Michel Bickers, MD as Consulting Physician (Infectious Diseases) Gavin Pound, MD as Consulting Physician (Rheumatology)  Inpatient Treatment Team: Treatment Team: Attending Provider: Michael Boston, MD; Technician: Rudi Heap, NT; Registered Nurse: Sunny Schlein, RN; Registered Nurse: Mortimer Fries, RN; Physical Therapist: Fuller Mandril, PT   Problem List:   Principal Problem:   Diverticulitis with contained abscess status post robotic low anterior rectosigmoid resection 12/11/2017 Active Problems:   Immunosuppressed status ([place])   Essential hypertension   Gastroesophageal reflux disease without esophagitis   Fatty liver disease, nonalcoholic   Current chronic use of systemic steroids   Rheumatoid arthritis involving multiple sites with positive rheumatoid factor ([place])   Obesity with body mass index 30 or greater   Abscess of sigmoid colon due to diverticulitis   1 Day Post-Op  12/11/2017  POST-OPERATIVE DIAGNOSIS:  Rectosigmoid colon diverticulitis with abscess  PROCEDURE:   XI ROBOTIC ASSISTED LOW ANTERIOR RESECTION OF RECTOSIGMOID COLON BILATERAL SALPINGO OOPHORECTOMY RIDGED PROCTOSCOPY ROBOTIC LYSIS OF ADHESIONS X 75 MIN (1/3 CASE)  SURGEON:  [name], MD   Assessment  Recovering  Plan:  Stop IV fluids.  IV fluid bolus for back off.  Hold off on home diuretics for now.  Advance diet.  Increase physical activity  level.  Blood pressure control.  Pain control for rheumatoid and osteoarthritis and postoperative pain.  Follow hemoglobin closely.  Low magnesium: Correct  GERD: Maalox as needed and follow.  VTE prophylaxis- SCDs, etc. Lovenox.  Follow hemoglobin  Mobilize as tolerated to help recovery  Follow-up on pathology.  I discussed operative findings, updated the patient's status, discussed probable steps to recovery, and gave postoperative recommendations to the patient.  Recommendations were made.  Questions were answered.  She expressed understanding & appreciation.   20 minutes spent in review, evaluation, examination, counseling, and coordination of care.  More than 50% of that time was spent in counseling.  [name], M.D., F.A.C.S. Gastrointestinal and Minimally Invasive Surgery [facility] [address], [address] [place], [place] [address] [phone] Main / Paging   12/12/2017    Subjective: (Chief complaint)  Stood up and walked to doorway.  Sore in left corner of Pfannenstiel incision.  Pain controlled.  No nausea or vomiting.  Tolerating liquids.  Objective:  Vital signs:  Vitals:   12/11/17 2030 12/11/17 2200 12/12/17 0200 12/12/17 0500  BP: (!) 153/63 (!) 147/81 137/63   Pulse: 69 72 69   Resp: _0 Temp: 97.7 F (36.5 C) 98.7 F (37.1 C) 98.3 F (36.8 C)   TempSrc: Oral Oral Oral   SpO2: 100% 100% 100%   Weight:    86.7 kg (191 lb 2.2 oz)  Height:        Last BM Date: 12/11/17(early this morning)  Intake/Output   Yesterday:  01/09 0701 - 01/10 0700 In: 3655.8 [P.O.:240; I.V.:3415.8] Out: 915 [Urine:400; Drains:415; Blood:100] This shift:  No intake/output data recorded.  Bowel function:  Flatus: No  BM:  No  Drain: Serosanguinous   Physical Exam:  General: Pt awake/alert/oriented x4 in no acute distress.  Mildly groggy at first but then perks up. Eyes: PERRL, normal EOM.  Sclera clear.  No  icterus Neuro: CN II-XII intact w/o focal sensory/motor deficits. Lymph: No head/neck/groin lymphadenopathy Psych:  No delerium/psychosis/paranoia HENT: Normocephalic, Mucus membranes moist.  No thrush Neck: Supple, No tracheal deviation Chest:  No chest wall pain w good excursion CV:  Pulses intact.  Regular rhythm MS: Normal AROM mjr joints.  No obvious deformity  Abdomen: Soft.  Nondistended.  Tenderness at Left side of Pfannenstiel suprapubic incision.  Dressing clean..  No evidence of peritonitis.  No incarcerated hernias.  Ext:  No deformity.  No mjr edema.  No cyanosis Skin: No petechiae / purpura  Results:   Labs: Results for orders placed or performed during the hospital encounter of 12/11/17 (from the past 48 hour(s))  Hemoglobin A1c     Status: None   Collection Time: 12/11/17  7:10 AM  Result Value Ref Range   Hgb A1c MFr Bld 5.4 4.8 - 5.6 %    Comment: (NOTE) Pre diabetes:          5.7%-6.4% Diabetes:              >6.4% Glycemic control for   <7.0% adults with diabetes    Mean Plasma Glucose 108.28 mg/dL    Comment: Performed at [facility], [place] [address] [place], [place] [address]  Basic metabolic panel     Status: None   Collection Time: 12/12/17  5:02 AM  Result Value Ref Range   Sodium 140 135 - 145 mmol/L   Potassium 3.7 3.5 - 5.1 mmol/L   Chloride 105 101 - 111 mmol/L   CO2 27 22 - 32 mmol/L   Glucose, Bld 91 65 - 99 mg/dL   BUN 11 6 - 20 mg/dL   Creatinine, Ser 0.79 0.44 - 1.00 mg/dL   Calcium 9.0 8.9 - 10.3 mg/dL   GFR calc non Af Amer >60 >60 mL/min   GFR calc Af Amer >60 >60 mL/min    Comment: (NOTE) The eGFR has been calculated using the CKD EPI equation. This calculation has not been validated in all clinical situations. eGFR's persistently <60 mL/min signify possible Chronic Kidney Disease.    Anion gap 8 5 - 15  CBC     Status: Abnormal   Collection Time: 12/12/17  5:02 AM  Result Value Ref Range   WBC 32.7 (H) 4.0 - 10.5 K/uL    RBC 3.33 (L) 3.87 - 5.11 MIL/uL   Hemoglobin 9.9 (L) 12.0 - 15.0 g/dL   HCT 30.5 (L) 36.0 - 46.0 %   MCV 91.6 78.0 - 100.0 fL   MCH 29.7 26.0 - 34.0 pg   MCHC 32.5 30.0 - 36.0 g/dL   RDW 15.5 11.5 - 15.5 %   Platelets 151 150 - 400 K/uL  Magnesium     Status: Abnormal   Collection Time: 12/12/17  5:02 AM  Result Value Ref Range   Magnesium 1.6 (L) 1.7 - 2.4 mg/dL    Imaging / Studies: No results found.  Medications / Allergies: per chart  Antibiotics: Anti-infectives (From admission, onward)   Start     Dose/Rate Route Frequency Ordered Stop   12/12/17 1000  hydroxychloroquine (PLAQUENIL) tablet 400 mg     400 mg Oral Daily 12/11/17 1438     12/11/17 2100  cefoTEtan (CEFOTAN) 2 g in dextrose 5 %  50 mL IVPB     2 g 100 mL/hr over 30 Minutes Intravenous Every 12 hours 12/11/17 1438 12/11/17 2335   12/11/17 1115  clindamycin (CLEOCIN) 900 mg, gentamicin (GARAMYCIN) 240 mg in sodium chloride 0.9 % 1,000 mL for intraperitoneal lavage  Status:  Discontinued       As needed 12/11/17 1115 12/11/17 1142   12/11/17 0700  clindamycin (CLEOCIN) 900 mg, gentamicin (GARAMYCIN) 240 mg in sodium chloride 0.9 % 1,000 mL for intraperitoneal lavage  Status:  Discontinued      Intraperitoneal To Surgery 12/11/17 0651 12/11/17 1436   12/11/17 0651  cefoTEtan in Dextrose 5% (CEFOTAN) IVPB 2 g     2 g Intravenous On call to O.R. 12/11/17 9574 12/11/17 0852   12/11/17 0651  neomycin (MYCIFRADIN) tablet 1,000 mg  Status:  Discontinued     1,000 mg Oral 3 times per day 12/11/17 0651 12/11/17 1436   12/11/17 0651  metroNIDAZOLE (FLAGYL) tablet 1,000 mg  Status:  Discontinued     1,000 mg Oral 3 times per day 12/11/17 0651 12/11/17 1436        Note: Portions of this report may have been transcribed using voice recognition software. Every effort was made to ensure accuracy; however, inadvertent computerized transcription errors may be present.   Any transcriptional errors that result from this  process are unintentional.     [name], M.D., F.A.C.S. Gastrointestinal and Minimally Invasive Surgery [facility] [address], [place], [place] [address] [phone] Main / Paging   12/12/2017

## 2017-12-12 NOTE — Progress Notes (Signed)
PT Cancellation Note  Patient Details Name: Morgan [name] MRN: [id number] DOB: 02/21/1951   Cancelled Treatment:    Reason Eval/Treat Not Completed: PT screened, no needs identified, will sign off, ambulating multiple times with faMILY.   Claretha Cooper 12/12/2017, 1:58 PM Tresa Endo PT [phone]

## 2017-12-13 ENCOUNTER — Encounter (HOSPITAL_COMMUNITY): Payer: Self-pay | Admitting: Surgery

## 2017-12-13 LAB — CBC
HCT: 29 % — ABNORMAL LOW (ref 36.0–46.0)
HEMOGLOBIN: 9.5 g/dL — AB (ref 12.0–15.0)
MCH: 29.7 pg (ref 26.0–34.0)
MCHC: 32.8 g/dL (ref 30.0–36.0)
MCV: 90.6 fL (ref 78.0–100.0)
PLATELETS: 136 10*3/uL — AB (ref 150–400)
RBC: 3.2 MIL/uL — ABNORMAL LOW (ref 3.87–5.11)
RDW: 15.6 % — ABNORMAL HIGH (ref 11.5–15.5)
WBC: 24 10*3/uL — AB (ref 4.0–10.5)

## 2017-12-13 LAB — CREATININE, SERUM
Creatinine, Ser: 0.65 mg/dL (ref 0.44–1.00)
GFR calc non Af Amer: >60 mL/min (ref >60)

## 2017-12-13 LAB — MAGNESIUM: MAGNESIUM: 1.9 mg/dL (ref 1.7–2.4)

## 2017-12-13 LAB — POTASSIUM: POTASSIUM: 3.7 mmol/L (ref 3.5–5.1)

## 2017-12-13 MED ORDER — FENOFIBRATE 160 MG PO TABS
160.0000 mg | ORAL_TABLET | Freq: Every day | ORAL | Status: DC
  Administered 2017-12-13 – 2017-12-16 (×4): 160 mg via ORAL
  Filled 2017-12-13 (×4): qty 1

## 2017-12-13 MED ORDER — ACETAMINOPHEN 500 MG PO TABS
1000.0000 mg | ORAL_TABLET | Freq: Four times a day (QID) | ORAL | Status: DC
  Administered 2017-12-13 – 2017-12-17 (×14): 1000 mg via ORAL
  Filled 2017-12-13 (×14): qty 2

## 2017-12-13 MED ORDER — HYDROCHLOROTHIAZIDE 25 MG PO TABS
25.0000 mg | ORAL_TABLET | Freq: Every day | ORAL | Status: DC
  Administered 2017-12-13 – 2017-12-17 (×5): 25 mg via ORAL
  Filled 2017-12-13 (×5): qty 1

## 2017-12-13 MED ORDER — HYOSCYAMINE SULFATE 0.125 MG SL SUBL
0.1250 mg | SUBLINGUAL_TABLET | Freq: Once | SUBLINGUAL | Status: AC
  Administered 2017-12-13: 0.125 mg via ORAL
  Filled 2017-12-13: qty 1

## 2017-12-13 MED ORDER — POLYETHYLENE GLYCOL 3350 17 G PO PACK
17.0000 g | PACK | Freq: Every day | ORAL | Status: DC
  Filled 2017-12-13 (×2): qty 1

## 2017-12-13 MED ORDER — POLYETHYLENE GLYCOL 3350 17 GM/SCOOP PO POWD
17.0000 g | Freq: Every day | ORAL | Status: DC
  Filled 2017-12-13: qty 255

## 2017-12-13 NOTE — Progress Notes (Signed)
S/p robotic LAR  Path benign - diverticulitis I told the pt the good news  Copy of pathology report given

## 2017-12-13 NOTE — Progress Notes (Signed)
[facility] [place]  [address] [place], [place] [address] Phone: [phone]  FAX: [place] [phone] [birth year]  CARE TEAM:  PCP: [name], MD  Outpatient Care Team: Patient Care Team: [name], MD as PCP - General (Family Medicine) Michael Boston, MD as Consulting Physician (General Surgery) Irene Shipper, MD as Consulting Physician (Gastroenterology) Wyatt Portela, MD as Consulting Physician (Hematology and Oncology) Michel Bickers, MD as Consulting Physician (Infectious Diseases) Gavin Pound, MD as Consulting Physician (Rheumatology)  Inpatient Treatment Team: Treatment Team: Attending Provider: Michael Boston, MD; Technician: Rudi Heap, NT; Case Manager: Guadalupe Maple, RN; Registered Nurse: Rossie Muskrat, RN; Technician: Estrellita Ludwig, NT; Technician: Reuel Derby, NT   Problem List:   Principal Problem:   Diverticulitis with abscess status post robotic low anterior rectosigmoid resection 12/11/2017 Active Problems:   Immunosuppressed status ([place])   Essential hypertension   Gastroesophageal reflux disease without esophagitis   Fatty liver disease, nonalcoholic   Current chronic use of systemic steroids   Rheumatoid arthritis involving multiple sites with positive rheumatoid factor ([place])   Obesity with body mass index 30 or greater   Abscess of sigmoid colon due to diverticulitis   2 Days Post-Op  12/11/2017  POST-OPERATIVE DIAGNOSIS:  Rectosigmoid colon diverticulitis with abscess  PROCEDURE:   XI ROBOTIC ASSISTED LOW ANTERIOR RESECTION OF RECTOSIGMOID COLON BILATERAL SALPINGO OOPHORECTOMY RIDGED PROCTOSCOPY ROBOTIC LYSIS OF ADHESIONS X 75 MIN (1/3 CASE)  SURGEON:  [name], MD   Assessment  Recovering  Plan:  Advance diet to solids.  Increase physical activity level.  Walking better  Blood pressure control.  Restart home meds w  diuretic  Pain control for rheumatoid and osteoarthritis and postoperative pain.  Low magnesium: Corrected  Inc WBC prob from Decadron periop - falling  D/C dressing POD#3 = Sat AM.  Most likely drain out on day of d/c  GERD: Maalox as needed and follow.  VTE prophylaxis- SCDs, etc. Lovenox.  Follow hemoglobin  Mobilize as tolerated to help recovery  Pathology c/w diverticulitis with BSO fallopian tube cysts - copy of pathology given to patient.  I updated the patient's status to the patient and nurse.  Recommendations were made.  Questions were answered.  They expressed understanding & appreciation.  D/C patient from hospital when patient meets criteria (anticipate in 1-2 day(s)):  Tolerating oral intake well Ambulating well Adequate pain control without IV medications Urinating  Having flatus Disposition planning in place    30 minutes spent in review, evaluation, examination, counseling, and coordination of care.  More than 50% of that time was spent in counseling.  [name], M.D., F.A.C.S. Gastrointestinal and Minimally Invasive Surgery [facility] [address], [place], [place] [address] [phone] Main / Paging   12/13/2017    Subjective: (Chief complaint)  Walking in hallways Sore LLQ abdomen - better overall.  PO meds mostly handling it Scant flatus Belching.  No bloating/N/V Tol fulls - not adv to solids  Objective:  Vital signs:  Vitals:   12/12/17 0919 12/12/17 1400 12/12/17 2244 12/13/17 0538  BP: 121/60 140/61 (!) 137/54 (!) 139/52  Pulse: 72 75 77 74  Resp: _0 Temp: 98.6 F (37 C) 98.5 F (36.9 C) (!) 96.7 F (35.9 C) (!) 97.1 F (36.2 C)  TempSrc: Oral Oral Oral Oral  SpO2: 99% 99% 100% 100%  Weight:  88.7 kg (195 lb 8.8 oz)  Height:        Last BM Date: 12/11/17  Intake/Output   Yesterday:  01/10 0701 - 01/11 0700 In: 243 [P.O.:180; I.V.:3; IV Piggyback:60] Out: 2730  [Urine:2650; Drains:80] This shift:  No intake/output data recorded.  Bowel function:  Flatus: YES  BM:  No  Drain: Serosanguinous   Physical Exam:  General: Pt awake/alert/oriented x4 in no acute distress.   Eyes: PERRL, normal EOM.  Sclera clear.  No icterus Neuro: CN II-XII intact w/o focal sensory/motor deficits. Lymph: No head/neck/groin lymphadenopathy Psych:  No delerium/psychosis/paranoia HENT: Normocephalic, Mucus membranes moist.  No thrush Neck: Supple, No tracheal deviation Chest:  No chest wall pain w good excursion CV:  Pulses intact.  Regular rhythm MS: Normal AROM mjr joints.  No obvious deformity  Abdomen: Soft.  Mildy distended.  Tenderness at LLQ abdomen.  Dressings clean..  No evidence of peritonitis.  No incarcerated hernias.  Ext:  No deformity.  No mjr edema.  No cyanosis Skin: No petechiae / purpura  Results:   Labs: Results for orders placed or performed during the hospital encounter of 12/11/17 (from the past 48 hour(s))  Basic metabolic panel     Status: None   Collection Time: 12/12/17  5:02 AM  Result Value Ref Range   Sodium 140 135 - 145 mmol/L   Potassium 3.7 3.5 - 5.1 mmol/L   Chloride 105 101 - 111 mmol/L   CO2 27 22 - 32 mmol/L   Glucose, Bld 91 65 - 99 mg/dL   BUN 11 6 - 20 mg/dL   Creatinine, Ser 0.79 0.44 - 1.00 mg/dL   Calcium 9.0 8.9 - 10.3 mg/dL   GFR calc non Af Amer >60 >60 mL/min   GFR calc Af Amer >60 >60 mL/min    Comment: (NOTE) The eGFR has been calculated using the CKD EPI equation. This calculation has not been validated in all clinical situations. eGFR's persistently <60 mL/min signify possible Chronic Kidney Disease.    Anion gap 8 5 - 15  CBC     Status: Abnormal   Collection Time: 12/12/17  5:02 AM  Result Value Ref Range   WBC 32.7 (H) 4.0 - 10.5 K/uL   RBC 3.33 (L) 3.87 - 5.11 MIL/uL   Hemoglobin 9.9 (L) 12.0 - 15.0 g/dL   HCT 30.5 (L) 36.0 - 46.0 %   MCV 91.6 78.0 - 100.0 fL   MCH 29.7 26.0 - 34.0 pg    MCHC 32.5 30.0 - 36.0 g/dL   RDW 15.5 11.5 - 15.5 %   Platelets 151 150 - 400 K/uL  Magnesium     Status: Abnormal   Collection Time: 12/12/17  5:02 AM  Result Value Ref Range   Magnesium 1.6 (L) 1.7 - 2.4 mg/dL  Creatinine, serum     Status: None   Collection Time: 12/13/17  5:25 AM  Result Value Ref Range   Creatinine, Ser 0.65 0.44 - 1.00 mg/dL   GFR calc non Af Amer >60 >60 mL/min   GFR calc Af Amer >60 >60 mL/min    Comment: (NOTE) The eGFR has been calculated using the CKD EPI equation. This calculation has not been validated in all clinical situations. eGFR's persistently <60 mL/min signify possible Chronic Kidney Disease.   Potassium     Status: None   Collection Time: 12/13/17  5:25 AM  Result Value Ref Range   Potassium 3.7 3.5 - 5.1 mmol/L  Magnesium       Status: None   Collection Time: 12/13/17  5:25 AM  Result Value Ref Range   Magnesium 1.9 1.7 - 2.4 mg/dL  CBC     Status: Abnormal   Collection Time: 12/13/17  5:25 AM  Result Value Ref Range   WBC 24.0 (H) 4.0 - 10.5 K/uL   RBC 3.20 (L) 3.87 - 5.11 MIL/uL   Hemoglobin 9.5 (L) 12.0 - 15.0 g/dL   HCT 29.0 (L) 36.0 - 46.0 %   MCV 90.6 78.0 - 100.0 fL   MCH 29.7 26.0 - 34.0 pg   MCHC 32.8 30.0 - 36.0 g/dL   RDW 15.6 (H) 11.5 - 15.5 %   Platelets 136 (L) 150 - 400 K/uL    Imaging / Studies: No results found.  Medications / Allergies: per chart  Antibiotics: Anti-infectives (From admission, onward)   Start     Dose/Rate Route Frequency Ordered Stop   12/12/17 1000  hydroxychloroquine (PLAQUENIL) tablet 400 mg     400 mg Oral Daily 12/11/17 1438     12/11/17 2100  cefoTEtan (CEFOTAN) 2 g in dextrose 5 % 50 mL IVPB     2 g 100 mL/hr over 30 Minutes Intravenous Every 12 hours 12/11/17 1438 12/11/17 2335   12/11/17 1115  clindamycin (CLEOCIN) 900 mg, gentamicin (GARAMYCIN) 240 mg in sodium chloride 0.9 % 1,000 mL for intraperitoneal lavage  Status:  Discontinued       As needed 12/11/17 1115 12/11/17 1142    12/11/17 0700  clindamycin (CLEOCIN) 900 mg, gentamicin (GARAMYCIN) 240 mg in sodium chloride 0.9 % 1,000 mL for intraperitoneal lavage  Status:  Discontinued      Intraperitoneal To Surgery 12/11/17 0651 12/11/17 1436   12/11/17 0651  cefoTEtan in Dextrose 5% (CEFOTAN) IVPB 2 g     2 g Intravenous On call to O.R. 12/11/17 0651 12/11/17 0852   12/11/17 0651  neomycin (MYCIFRADIN) tablet 1,000 mg  Status:  Discontinued     1,000 mg Oral 3 times per day 12/11/17 0651 12/11/17 1436   12/11/17 0651  metroNIDAZOLE (FLAGYL) tablet 1,000 mg  Status:  Discontinued     1,000 mg Oral 3 times per day 12/11/17 0651 12/11/17 1436        Note: Portions of this report may have been transcribed using voice recognition software. Every effort was made to ensure accuracy; however, inadvertent computerized transcription errors may be present.   Any transcriptional errors that result from this process are unintentional.     Steven C. Gross, M.D., F.A.C.S. Gastrointestinal and Minimally Invasive Surgery [facility] [address], [address] , [place] [address] [phone] Main / Paging   12/13/2017  

## 2017-12-14 NOTE — Progress Notes (Signed)
[facility]  Assessment & Plan: POD#3 XI ROBOTIC ASSISTED LOW ANTERIOR RESECTION OF RECTOSIGMOID COLON BILATERAL SALPINGO OOPHORECTOMY RIDGED PROCTOSCOPY ROBOTICLYSIS OF ADHESIONS X 75 MIN (1/3 CASE)   Encouraged OOB, ambulation in halls  Limited po intake - will monitor  Drain in place - will remove at time of discharge per Dr. Johney Maine' instructions        Earnstine Regal, MD, [facility]       Office: [phone]    Chief Complaint: Diverticulitis with abscess  Subjective: Patient up in chair, family at bedside.  Limited po intake, some nausea, no emesis.  Passing limited flatus, no BM's.  Objective: Vital signs in last 24 hours: Temp:  [97.9 F (36.6 C)-98.5 F (36.9 C)] 98.5 F (36.9 C) (01/12 0540) Pulse Rate:  [71-83] 83 (01/12 0540) Resp:  [16-18] 16 (01/12 0540) BP: (113-122)/(56-58) 113/58 (01/12 0540) SpO2:  [97 %-100 %] 97 % (01/12 0540) Weight:  [83.1 kg (183 lb 3.2 oz)-88.6 kg (195 lb 5.2 oz)] 83.1 kg (183 lb 3.2 oz) (01/12 0700) Last BM Date: 12/14/17  Intake/Output from previous day: 01/11 0701 - 01/12 0700 In: 700 [P.O.:590; I.V.:110] Out: 2184 [Urine:2050; Drains:134] Intake/Output this shift: Total I/O In: 0  Out: 38 [Urine:30; Drains:8]  Physical Exam: HEENT - sclerae clear, mucous membranes moist Neck - soft Chest - clear bilaterally Cor - RRR Abdomen - soft, obese; wounds dry and intact; JP with moderate serosanguinous output Ext - no edema, non-tender Neuro - alert & oriented, no focal deficits  Lab Results:  Recent Labs    12/12/17 0502 12/13/17 0525  WBC 32.7* 24.0*  HGB 9.9* 9.5*  HCT 30.5* 29.0*  PLT 151 136*   BMET Recent Labs    12/12/17 0502 12/13/17 0525  NA 140  --   K 3.7 3.7  CL 105  --   CO2 27  --   GLUCOSE 91  --   BUN 11  --   CREATININE 0.79 0.65  CALCIUM 9.0  --    PT/INR No results for input(s): LABPROT, INR in the last 72  hours. Comprehensive Metabolic Panel:    Component Value Date/Time   NA 140 12/12/2017 0502   NA 139 12/09/2017 0929   NA 139 06/28/2017 0851   NA 140 12/28/2016 0910   K 3.7 12/13/2017 0525   K 3.7 12/12/2017 0502   K 3.8 06/28/2017 0851   K 3.9 12/28/2016 0910   CL 105 12/12/2017 0502   CL 99 12/09/2017 0929   CO2 27 12/12/2017 0502   CO2 32 12/09/2017 0929   CO2 28 06/28/2017 0851   CO2 28 12/28/2016 0910   BUN 11 12/12/2017 0502   BUN 21 12/09/2017 0929   BUN 21.4 06/28/2017 0851   BUN 24.5 12/28/2016 0910   CREATININE 0.65 12/13/2017 0525   CREATININE 0.79 12/12/2017 0502   CREATININE 1.0 06/28/2017 0851   CREATININE 0.8 12/28/2016 0910   GLUCOSE 91 12/12/2017 0502   GLUCOSE 92 12/09/2017 0929   GLUCOSE 94 06/28/2017 0851   GLUCOSE 92 12/28/2016 0910   CALCIUM 9.0 12/12/2017 0502   CALCIUM 10.9 (H) 12/09/2017 0929   CALCIUM 10.8 (H) 06/28/2017 0851   CALCIUM 10.4 12/28/2016 0910   AST 23 12/09/2017 0929   AST 25 10/11/2017 0437   AST 29 06/28/2017 0851   AST 27 12/28/2016 0910   ALT 18 12/09/2017 0929   ALT 17  10/11/2017 0437   ALT 19 06/28/2017 0851   ALT 25 12/28/2016 0910   ALKPHOS 58 12/09/2017 0929   ALKPHOS 45 10/11/2017 0437   ALKPHOS 49 06/28/2017 0851   ALKPHOS 60 12/28/2016 0910   BILITOT 0.4 12/09/2017 0929   BILITOT 0.6 10/11/2017 0437   BILITOT 0.68 06/28/2017 0851   BILITOT 0.51 12/28/2016 0910   PROT 7.1 12/09/2017 0929   PROT 7.4 10/11/2017 0437   PROT 7.9 06/28/2017 0851   PROT 7.7 12/28/2016 0910   ALBUMIN 4.2 12/09/2017 0929   ALBUMIN 3.5 10/11/2017 0437   ALBUMIN 4.2 06/28/2017 0851   ALBUMIN 4.0 12/28/2016 0910    Studies/Results: No results found.    Deavin Forst M 12/14/2017  Patient ID: [name], female   DOB: [birth year], 67 y.o.   MRN: [id number]

## 2017-12-15 LAB — CBC
HCT: 28.9 % — ABNORMAL LOW (ref 36.0–46.0)
HEMOGLOBIN: 9.6 g/dL — AB (ref 12.0–15.0)
MCH: 29.7 pg (ref 26.0–34.0)
MCHC: 33.2 g/dL (ref 30.0–36.0)
MCV: 89.5 fL (ref 78.0–100.0)
PLATELETS: 124 10*3/uL — AB (ref 150–400)
RBC: 3.23 MIL/uL — ABNORMAL LOW (ref 3.87–5.11)
RDW: 15.6 % — ABNORMAL HIGH (ref 11.5–15.5)
WBC: 29.6 10*3/uL — ABNORMAL HIGH (ref 4.0–10.5)

## 2017-12-15 NOTE — Progress Notes (Signed)
General Surgery [facility]  Assessment & Plan: POD#4 - status post: XI ROBOTIC ASSISTED LOW ANTERIOR RESECTION OF RECTOSIGMOID COLON BILATERAL SALPINGO OOPHORECTOMY RIDGED PROCTOSCOPY ROBOTICLYSIS OF ADHESIONS X 75 MIN (1/3 CASE)              Encouraged OOB, ambulation in halls             Some nausea - Rx with Zofran last night             Limited po intake - will monitor and encourage increased intake             Drain in place - will remove at time of discharge per Dr. Johney Maine' instructions             Some blood in BM's - will ask nurse to document and monitor             Will check CBC today  Not quite ready for discharge today - patient would like to stay another day.        Earnstine Regal, MD, [facility]       Office: [phone]    Chief Complaint: Diverticular disease  Subjective: Patient up at side of bed, uncomfortable, some nausea.  Loose BM this AM with blood.  Limited po intake.  Objective: Vital signs in last 24 hours: Temp:  [97.9 F (36.6 C)-98.6 F (37 C)] 98.6 F (37 C) (01/13 0549) Pulse Rate:  [80-95] 95 (01/13 0549) Resp:  [16-18] 18 (01/13 0549) BP: (118-137)/(48-80) 137/61 (01/13 0549) SpO2:  [97 %-100 %] 98 % (01/13 0549) Last BM Date: 12/14/17  Intake/Output from previous day: 01/12 0701 - 01/13 0700 In: 0  Out: 1540 [Urine:1430; Drains:110] Intake/Output this shift: No intake/output data recorded.  Physical Exam: HEENT - sclerae clear, mucous membranes moist Neck - soft Chest - clear bilaterally Cor - RRR Abdomen - soft, mild distension; few BS present; wounds dry and intact; JP drain with thin serosanguinous Ext - no edema, non-tender Neuro - alert & oriented, no focal deficits  Lab Results:  Recent Labs    12/13/17 0525  WBC 24.0*  HGB 9.5*  HCT 29.0*  PLT 136*   BMET Recent Labs    12/13/17 0525  K 3.7  CREATININE 0.65   PT/INR No results for input(s):  LABPROT, INR in the last 72 hours. Comprehensive Metabolic Panel:    Component Value Date/Time   NA 140 12/12/2017 0502   NA 139 12/09/2017 0929   NA 139 06/28/2017 0851   NA 140 12/28/2016 0910   K 3.7 12/13/2017 0525   K 3.7 12/12/2017 0502   K 3.8 06/28/2017 0851   K 3.9 12/28/2016 0910   CL 105 12/12/2017 0502   CL 99 12/09/2017 0929   CO2 27 12/12/2017 0502   CO2 32 12/09/2017 0929   CO2 28 06/28/2017 0851   CO2 28 12/28/2016 0910   BUN 11 12/12/2017 0502   BUN 21 12/09/2017 0929   BUN 21.4 06/28/2017 0851   BUN 24.5 12/28/2016 0910   CREATININE 0.65 12/13/2017 0525   CREATININE 0.79 12/12/2017 0502   CREATININE 1.0 06/28/2017 0851   CREATININE 0.8 12/28/2016 0910   GLUCOSE 91 12/12/2017 0502   GLUCOSE 92 12/09/2017 0929   GLUCOSE 94 06/28/2017 0851   GLUCOSE 92 12/28/2016 0910   CALCIUM 9.0 12/12/2017 0502   CALCIUM 10.9 (H) 12/09/2017 0929   CALCIUM  10.8 (H) 06/28/2017 0851   CALCIUM 10.4 12/28/2016 0910   AST 23 12/09/2017 0929   AST 25 10/11/2017 0437   AST 29 06/28/2017 0851   AST 27 12/28/2016 0910   ALT 18 12/09/2017 0929   ALT 17 10/11/2017 0437   ALT 19 06/28/2017 0851   ALT 25 12/28/2016 0910   ALKPHOS 58 12/09/2017 0929   ALKPHOS 45 10/11/2017 0437   ALKPHOS 49 06/28/2017 0851   ALKPHOS 60 12/28/2016 0910   BILITOT 0.4 12/09/2017 0929   BILITOT 0.6 10/11/2017 0437   BILITOT 0.68 06/28/2017 0851   BILITOT 0.51 12/28/2016 0910   PROT 7.1 12/09/2017 0929   PROT 7.4 10/11/2017 0437   PROT 7.9 06/28/2017 0851   PROT 7.7 12/28/2016 0910   ALBUMIN 4.2 12/09/2017 0929   ALBUMIN 3.5 10/11/2017 0437   ALBUMIN 4.2 06/28/2017 0851   ALBUMIN 4.0 12/28/2016 0910    Studies/Results: No results found.    Morgan [name] 12/15/2017  Patient ID: Morgan [name], female   DOB: Apr 19, 1951, 67 y.o.   MRN: [id number]

## 2017-12-16 MED ORDER — PSYLLIUM 95 % PO PACK
1.0000 | PACK | Freq: Two times a day (BID) | ORAL | Status: DC
  Administered 2017-12-16 – 2017-12-17 (×3): 1 via ORAL
  Filled 2017-12-16 (×3): qty 1

## 2017-12-16 MED ORDER — SIMETHICONE 40 MG/0.6ML PO SUSP
40.0000 mg | Freq: Four times a day (QID) | ORAL | Status: DC
  Administered 2017-12-16 – 2017-12-17 (×6): 40 mg via ORAL
  Filled 2017-12-16 (×8): qty 0.6

## 2017-12-16 MED ORDER — ENSURE SURGERY PO LIQD
237.0000 mL | Freq: Two times a day (BID) | ORAL | Status: DC
  Administered 2017-12-16 – 2017-12-17 (×3): 237 mL via ORAL
  Filled 2017-12-16 (×4): qty 237

## 2017-12-16 NOTE — Progress Notes (Signed)
[facility]  [address] [place], [place] [address] Phone: [phone]  FAX: Grayson Encinas [phone] [birth year]  CARE TEAM:  PCP: [name], MD  Outpatient Care Team: Patient Care Team: [name], MD as PCP - General (Family Medicine) Michael Boston, MD as Consulting Physician (General Surgery) Irene Shipper, MD as Consulting Physician (Gastroenterology) Wyatt Portela, MD as Consulting Physician (Hematology and Oncology) Michel Bickers, MD as Consulting Physician (Infectious Diseases) Gavin Pound, MD as Consulting Physician (Rheumatology)  Inpatient Treatment Team: Treatment Team: Attending Provider: Michael Boston, MD; Technician: Rudi Heap, NT; Registered Nurse: Rossie Muskrat, RN; Technician: Estrellita Ludwig, NT; Technician: Reuel Derby, NT; Technician: Abbe Amsterdam, NT; Registered Nurse: Pennie Rushing, RN; Registered Nurse: Babs Sciara, RN   Problem List:   Principal Problem:   Diverticulitis with abscess status post robotic low anterior rectosigmoid resection 12/11/2017 Active Problems:   Immunosuppressed status ([place])   Essential hypertension   Gastroesophageal reflux disease without esophagitis   Fatty liver disease, nonalcoholic   Current chronic use of systemic steroids   Rheumatoid arthritis involving multiple sites with positive rheumatoid factor ([place])   Obesity with body mass index 30 or greater   Abscess of sigmoid colon due to diverticulitis   5 Days Post-Op  12/11/2017  POST-OPERATIVE DIAGNOSIS:  Rectosigmoid colon diverticulitis with abscess  PROCEDURE:   XI ROBOTIC ASSISTED LOW ANTERIOR RESECTION OF RECTOSIGMOID COLON BILATERAL SALPINGO OOPHORECTOMY RIDGED PROCTOSCOPY ROBOTIC LYSIS OF ADHESIONS X 75 MIN (1/3 CASE)  SURGEON:  [name], MD   Assessment  Ileus  Plan:  Diet as tolerated.  Soft  Add supp shakes  Bowel regimen - switch  off Miralax  Blood pressure control.  Home meds w diuretic  Pain control for rheumatoid and osteoarthritis and postoperative pain.  Better overall  Inc WBC Decadron periop  D/C drain  GERD: Maalox as needed and follow.  VTE prophylaxis- SCDs, etc. Lovenox.  Follow hemoglobin  Mobilize as tolerated to help recovery.  Improved  Pathology c/w diverticulitis with BSO fallopian tube cysts - copy of pathology given to patient.     I updated the patient's status to the patient, spouse, and nurses.  Recommendations were made.  Questions were answered.  They expressed understanding & appreciation.  D/C patient from hospital when patient meets criteria (anticipate in ?? day(s)):  Tolerating oral intake well Ambulating well Adequate pain control without IV medications Urinating  Having flatus/BMs Disposition planning in place    30 minutes spent in review, evaluation, examination, counseling, and coordination of care.  More than 50% of that time was spent in counseling.  [name], M.D., F.A.C.S. Gastrointestinal and Minimally Invasive Surgery [facility] [address] [place], [place] [address] [phone] Main / Paging   12/16/2017    Subjective: (Chief complaint)  Early satiety/bloating w belching- cannot tolerate much PO Larger loose BM yest AM - occ blood in BMs Walking in hallways better Sore only at drain - better.  PO meds mostly handling it RNs & husband in room   Objective:  Vital signs:  Vitals:   12/15/17 0549 12/15/17 1318 12/15/17 2126 12/16/17 0517  BP: 137/61 134/65 (!) 135/55 130/60  Pulse: 95 84 81 81  Resp: 18  18 18   Temp: 98.6 F (37 C) (!) 97.4 F (36.3 C) 98 F (36.7 C) 99 F (37.2 C)  TempSrc: Oral  Oral Oral Oral  SpO2: 98% 95% 100% 94%  Weight:  86.9 kg (191 lb 9.3 oz)    Height:        Last BM Date: 12/15/17  Intake/Output   Yesterday:  01/13 0701 - 01/14 0700 In: 480  [P.O.:480] Out: 1873 [Urine:1800; Drains:73] This shift:  No intake/output data recorded.  Bowel function:  Flatus: YES  BM:  No  Drain: Serosanguinous   Physical Exam:  General: Pt awake/alert/oriented x4 in no acute distress.   Eyes: PERRL, normal EOM.  Sclera clear.  No icterus Neuro: CN II-XII intact w/o focal sensory/motor deficits. Lymph: No head/neck/groin lymphadenopathy Psych:  No delerium/psychosis/paranoia HENT: Normocephalic, Mucus membranes moist.  No thrush Neck: Supple, No tracheal deviation Chest:  No chest wall pain w good excursion CV:  Pulses intact.  Regular rhythm MS: Normal AROM mjr joints.  No obvious deformity  Abdomen: Soft.  Nondistended.  Tenderness at RLQ drain site only.  No other abd pain.  Incisions c/d/i.  No evidence of peritonitis.  No incarcerated hernias.  Ext:  No deformity.  No mjr edema.  No cyanosis Skin: No petechiae / purpura  Results:   Labs: Results for orders placed or performed during the hospital encounter of 12/11/17 (from the past 48 hour(s))  CBC     Status: Abnormal   Collection Time: 12/15/17 10:15 AM  Result Value Ref Range   WBC 29.6 (H) 4.0 - 10.5 K/uL    Comment: CONSISTENT WITH PREVIOUS RESULT   RBC 3.23 (L) 3.87 - 5.11 MIL/uL   Hemoglobin 9.6 (L) 12.0 - 15.0 g/dL   HCT 28.9 (L) 36.0 - 46.0 %   MCV 89.5 78.0 - 100.0 fL   MCH 29.7 26.0 - 34.0 pg   MCHC 33.2 30.0 - 36.0 g/dL   RDW 15.6 (H) 11.5 - 15.5 %   Platelets 124 (L) 150 - 400 K/uL    Imaging / Studies: No results found.  Medications / Allergies: per chart  Antibiotics: Anti-infectives (From admission, onward)   Start     Dose/Rate Route Frequency Ordered Stop   12/12/17 1000  hydroxychloroquine (PLAQUENIL) tablet 400 mg     400 mg Oral Daily 12/11/17 1438     12/11/17 2100  cefoTEtan (CEFOTAN) 2 g in dextrose 5 % 50 mL IVPB     2 g 100 mL/hr over 30 Minutes Intravenous Every 12 hours 12/11/17 1438 12/11/17 2335   12/11/17 1115  clindamycin  (CLEOCIN) 900 mg, gentamicin (GARAMYCIN) 240 mg in sodium chloride 0.9 % 1,000 mL for intraperitoneal lavage  Status:  Discontinued       As needed 12/11/17 1115 12/11/17 1142   12/11/17 0700  clindamycin (CLEOCIN) 900 mg, gentamicin (GARAMYCIN) 240 mg in sodium chloride 0.9 % 1,000 mL for intraperitoneal lavage  Status:  Discontinued      Intraperitoneal To Surgery 12/11/17 0651 12/11/17 1436   12/11/17 0651  cefoTEtan in Dextrose 5% (CEFOTAN) IVPB 2 g     2 g Intravenous On call to O.R. 12/11/17 4098 12/11/17 0852   12/11/17 0651  neomycin (MYCIFRADIN) tablet 1,000 mg  Status:  Discontinued     1,000 mg Oral 3 times per day 12/11/17 0651 12/11/17 1436   12/11/17 0651  metroNIDAZOLE (FLAGYL) tablet 1,000 mg  Status:  Discontinued     1,000 mg Oral 3 times per day 12/11/17 0651 12/11/17 1436        Note: Portions of this report may have been transcribed using voice recognition software.  Every effort was made to ensure accuracy; however, inadvertent computerized transcription errors may be present.   Any transcriptional errors that result from this process are unintentional.     [name], M.D., F.A.C.S. Gastrointestinal and Minimally Invasive Surgery [facility] [address] [place], [place] [address] [phone] Main / Paging   12/16/2017

## 2017-12-17 LAB — BASIC METABOLIC PANEL
Anion gap: 9 (ref 5–15)
BUN: 9 mg/dL (ref 6–20)
CALCIUM: 9.2 mg/dL (ref 8.9–10.3)
CO2: 30 mmol/L (ref 22–32)
CREATININE: 0.58 mg/dL (ref 0.44–1.00)
Chloride: 95 mmol/L — ABNORMAL LOW (ref 101–111)
GFR calc non Af Amer: >60 mL/min (ref >60)
Glucose, Bld: 98 mg/dL (ref 65–99)
Potassium: 3.1 mmol/L — ABNORMAL LOW (ref 3.5–5.1)
SODIUM: 134 mmol/L — AB (ref 135–145)

## 2017-12-17 LAB — CBC
HCT: 27.7 % — ABNORMAL LOW (ref 36.0–46.0)
Hemoglobin: 9.2 g/dL — ABNORMAL LOW (ref 12.0–15.0)
MCH: 29.6 pg (ref 26.0–34.0)
MCHC: 33.2 g/dL (ref 30.0–36.0)
MCV: 89.1 fL (ref 78.0–100.0)
Platelets: 130 10*3/uL — ABNORMAL LOW (ref 150–400)
RBC: 3.11 MIL/uL — AB (ref 3.87–5.11)
RDW: 16 % — ABNORMAL HIGH (ref 11.5–15.5)
WBC: 25.3 10*3/uL — AB (ref 4.0–10.5)

## 2017-12-17 NOTE — Discharge Summary (Signed)
Physician Discharge Summary  Patient ID: Morgan [name] MRN: [id number] DOB/AGE: 69-May-1952  69 y.o.  Admit date: 12/11/2017 Discharge date: 12/17/2017   Patient Care Team: [name], MD as PCP - General (Family Medicine) Michael Boston, MD as Consulting Physician (General Surgery) Irene Shipper, MD as Consulting Physician (Gastroenterology) Wyatt Portela, MD as Consulting Physician (Hematology and Oncology) Michel Bickers, MD as Consulting Physician (Infectious Diseases) Gavin Pound, MD as Consulting Physician (Rheumatology)  Discharge Diagnoses:  Principal Problem:   Diverticulitis with abscess status post robotic low anterior rectosigmoid resection 12/11/2017 Active Problems:   Immunosuppressed status ([place])   Essential hypertension   Gastroesophageal reflux disease without esophagitis   Fatty liver disease, nonalcoholic   Current chronic use of systemic steroids   Rheumatoid arthritis involving multiple sites with positive rheumatoid factor ([place])   Obesity with body mass index 30 or greater   Abscess of sigmoid colon due to diverticulitis   6 Days Post-Op  12/11/2017  POST-OPERATIVE DIAGNOSIS:   sigmoid colon diverticulitis with abscess  SURGERY:  12/11/2017  Procedure(s): XI ROBOTIC ASSISTED LOW ANTERIOR RESECTION OF RECTOSIGMOID COLON WITH BILATERAL SALPINGO OOPHORECTOMY RIDGED PROCTOSCOPY LAPAROSCOPIC LYSIS OF ADHESIONS  SURGEON:    Surgeon(s): Leighton Ruff, MD Michael Boston, MD  Consults: None  Hospital Course:   The patient underwent the surgery above.  Postoperatively, the patient gradually mobilized and advanced to a solid diet.  She does struggle with some hematochezia.  Also some nausea and bloating.  Pain and other symptoms were treated aggressively.    By the time of discharge, the patient was walking well the hallways, eating food, having flatus.  Pain was well-controlled on an oral medications.  Dressings removed.  Drain removed.  Feeling  much better.  Based on meeting discharge criteria and continuing to recover, I felt it was safe for the patient to be discharged from the hospital to further recover with close followup.  The patient and nursing agreed as well.  Postoperative recommendations were discussed in detail.  They are written as well.  Discharged Condition: good  Disposition:  Follow-up Information    Michael Boston, MD. Schedule an appointment as soon as possible for a visit in 2 weeks.   Specialty:  General Surgery Why:  To follow up after your operation, To follow up after your hospital stay Contact information: [place] [address] [phone]           06-Home-Health Care Svc  Discharge Instructions    Call MD for:   Complete by:  As directed    FEVER > 101.5 F  (temperatures < 101.5 F are not significant)   Call MD for:   Complete by:  As directed    FEVER > 101.5 F  (temperatures < 101.5 F are not significant)   Call MD for:  extreme fatigue   Complete by:  As directed    Call MD for:  extreme fatigue   Complete by:  As directed    Call MD for:  persistant dizziness or light-headedness   Complete by:  As directed    Call MD for:  persistant dizziness or light-headedness   Complete by:  As directed    Call MD for:  persistant nausea and vomiting   Complete by:  As directed    Call MD for:  persistant nausea and vomiting   Complete by:  As directed    Call MD for:  redness, tenderness, or signs of infection (pain,  swelling, redness, odor or green/yellow discharge around incision site)   Complete by:  As directed    Call MD for:  redness, tenderness, or signs of infection (pain, swelling, redness, odor or green/yellow discharge around incision site)   Complete by:  As directed    Call MD for:  severe uncontrolled pain   Complete by:  As directed    Call MD for:  severe uncontrolled pain   Complete by:  As directed    Diet - low sodium heart healthy   Complete  by:  As directed    Follow a light diet the first few days at home.   Start with a bland diet such as soups, liquids, starchy foods, low fat foods, etc.   If you feel full, bloated, or constipated, stay on a full liquid or pureed/blenderized diet for a few days until you feel better and no longer constipated. Be sure to drink plenty of fluids every day to avoid getting dehydrated (feeling dizzy, not urinating, etc.). Gradually add a fiber supplement to your diet   Diet - low sodium heart healthy   Complete by:  As directed    Follow a light diet the first few days at home.   Start with a bland diet such as soups, liquids, starchy foods, low fat foods, etc.   If you feel full, bloated, or constipated, stay on a full liquid or pureed/blenderized diet for a few days until you feel better and no longer constipated. Be sure to drink plenty of fluids every day to avoid getting dehydrated (feeling dizzy, not urinating, etc.). Gradually add a fiber supplement to your diet   Discharge instructions   Complete by:  As directed    See Discharge Instructions If you are not getting better after two weeks or are noticing you are getting worse, contact our office [phone] for further advice.  We may need to adjust your medications, re-evaluate you in the office, send you to the emergency room, or see what other things we can do to help. The clinic staff is available to answer your questions during regular business hours (8:30am-5pm).  Please don't hesitate to call and ask to speak to one of our nurses for clinical concerns.    A surgeon from [facility] is always on call at the hospitals 24 hours/day If you have a medical emergency, go to the nearest emergency room or call 911.   Discharge instructions   Complete by:  As directed    See Discharge Instructions If you are not getting better after two weeks or are noticing you are getting worse, contact our office [phone] for further  advice.  We may need to adjust your medications, re-evaluate you in the office, send you to the emergency room, or see what other things we can do to help. The clinic staff is available to answer your questions during regular business hours (8:30am-5pm).  Please don't hesitate to call and ask to speak to one of our nurses for clinical concerns.    A surgeon from [facility] is always on call at the hospitals 24 hours/day If you have a medical emergency, go to the nearest emergency room or call 911.   Driving Restrictions   Complete by:  As directed    You may drive when you are no longer taking narcotic prescription pain medication, you can comfortably wear a seatbelt, and you can safely make sudden turns/stops to protect yourself without hesitating due to  pain.   Driving Restrictions   Complete by:  As directed    You may drive when you are no longer taking narcotic prescription pain medication, you can comfortably wear a seatbelt, and you can safely make sudden turns/stops to protect yourself without hesitating due to pain.   Increase activity slowly   Complete by:  As directed    Start light daily activities --- self-care, walking, climbing stairs- beginning the day after surgery.  Gradually increase activities as tolerated.  Control your pain to be active.  Stop when you are tired.  Ideally, walk several times a day, eventually an hour a day.   Most people are back to most day-to-day activities in a few weeks.  It takes 4-8 weeks to get back to unrestricted, intense activity. If you can walk 30 minutes without difficulty, it is safe to try more intense activity such as jogging, treadmill, bicycling, low-impact aerobics, swimming, etc. Save the most intensive and strenuous activity for last (Usually 4-8 weeks after surgery) such as sit-ups, heavy lifting, contact sports, etc.  Refrain from any intense heavy lifting or straining until you are off narcotics for pain control.  You will  have off days, but things should improve week-by-week. DO NOT PUSH THROUGH PAIN.  Let pain be your guide: If it hurts to do something, don't do it.  Pain is your body warning you to avoid that activity for another week until the pain goes down.   Increase activity slowly   Complete by:  As directed    Start light daily activities --- self-care, walking, climbing stairs- beginning the day after surgery.  Gradually increase activities as tolerated.  Control your pain to be active.  Stop when you are tired.  Ideally, walk several times a day, eventually an hour a day.   Most people are back to most day-to-day activities in a few weeks.  It takes 4-8 weeks to get back to unrestricted, intense activity. If you can walk 30 minutes without difficulty, it is safe to try more intense activity such as jogging, treadmill, bicycling, low-impact aerobics, swimming, etc. Save the most intensive and strenuous activity for last (Usually 4-8 weeks after surgery) such as sit-ups, heavy lifting, contact sports, etc.  Refrain from any intense heavy lifting or straining until you are off narcotics for pain control.  You will have off days, but things should improve week-by-week. DO NOT PUSH THROUGH PAIN.  Let pain be your guide: If it hurts to do something, don't do it.  Pain is your body warning you to avoid that activity for another week until the pain goes down.   Lifting restrictions   Complete by:  As directed    If you can walk 30 minutes without difficulty, it is safe to try more intense activity such as jogging, treadmill, bicycling, low-impact aerobics, swimming, etc. Save the most intensive and strenuous activity for last (Usually 4-8 weeks after surgery) such as sit-ups, heavy lifting, contact sports, etc.  Refrain from any intense heavy lifting or straining until you are off narcotics for pain control.  You will have off days, but things should improve week-by-week. DO NOT PUSH THROUGH PAIN.  Let pain be your  guide: If it hurts to do something, don't do it.  Pain is your body warning you to avoid that activity for another week until the pain goes down.   Lifting restrictions   Complete by:  As directed    If you can walk 30 minutes without  difficulty, it is safe to try more intense activity such as jogging, treadmill, bicycling, low-impact aerobics, swimming, etc. Save the most intensive and strenuous activity for last (Usually 4-8 weeks after surgery) such as sit-ups, heavy lifting, contact sports, etc.  Refrain from any intense heavy lifting or straining until you are off narcotics for pain control.  You will have off days, but things should improve week-by-week. DO NOT PUSH THROUGH PAIN.  Let pain be your guide: If it hurts to do something, don't do it.  Pain is your body warning you to avoid that activity for another week until the pain goes down.   May walk up steps   Complete by:  As directed    May walk up steps   Complete by:  As directed    No wound care   Complete by:  As directed    It is good for closed incision and even open wounds to be washed every day.  Shower every day.  Short baths are fine.  Wash the incisions and wounds clean with soap & water.    If you have a closed incision(s), wash the incision with soap & water every day.  You may leave closed incisions open to air if it is dry.   You may cover the incision with clean gauze & replace it after your daily shower for comfort. If you have skin tapes (Steristrips) or skin glue (Dermabond) on your incision, leave them in place.  They will fall off on their own like a scab.  You may trim any edges that curl up with clean scissors.  If you have staples, set up an appointment for them to be removed in the office in 10 days after surgery.  If you have a drain, wash around the skin exit site with soap & water and place a new dressing of gauze or band aid around the skin every day.  Keep the drain site clean & dry.   No wound care   Complete  by:  As directed    It is good for closed incision and even open wounds to be washed every day.  Shower every day.  Short baths are fine.  Wash the incisions and wounds clean with soap & water.    If you have a closed incision(s), wash the incision with soap & water every day.  You may leave closed incisions open to air if it is dry.   You may cover the incision with clean gauze & replace it after your daily shower for comfort. If you have skin tapes (Steristrips) or skin glue (Dermabond) on your incision, leave them in place.  They will fall off on their own like a scab.  You may trim any edges that curl up with clean scissors.  If you have staples, set up an appointment for them to be removed in the office in 10 days after surgery.  If you have a drain, wash around the skin exit site with soap & water and place a new dressing of gauze or band aid around the skin every day.  Keep the drain site clean & dry.   Sexual Activity Restrictions   Complete by:  As directed    You may have sexual intercourse when it is comfortable. If it hurts to do something, stop.   Sexual Activity Restrictions   Complete by:  As directed    You may have sexual intercourse when it is comfortable. If it hurts to do something, stop.  Allergies as of 12/17/2017      Reactions   Remicade [infliximab] Swelling, Other (See Comments)   Facial swelling, throat was closing up.       Medication List    STOP taking these medications   metroNIDAZOLE 500 MG tablet Commonly known as:  FLAGYL   neomycin 500 MG tablet Commonly known as:  MYCIFRADIN   polyethylene glycol powder powder Commonly known as:  GLYCOLAX/MIRALAX     TAKE these medications   acetaminophen 500 MG tablet Commonly known as:  TYLENOL Take 500 mg by mouth every 6 (six) hours as needed for moderate pain or headache.   ARTIFICIAL TEARS OP Place 1 drop into both eyes daily as needed (for dry eyes).   aspirin 81 MG tablet Take 81 mg by mouth 3  (three) times a week.   calcium-vitamin D 500-200 MG-UNIT tablet Commonly known as:  OSCAL WITH D Take 1 tablet by mouth 2 (two) times daily.   EQL VITAMIN D3 2000 units Caps Generic drug:  Cholecalciferol Take 2,000 Units by mouth daily.   fenofibrate 160 MG tablet Take 1 tablet (160 mg total) by mouth daily. What changed:  when to take this   fexofenadine 180 MG tablet Commonly known as:  ALLEGRA Take 180 mg by mouth daily.   hydrochlorothiazide 25 MG tablet Commonly known as:  HYDRODIURIL Take 25 mg by mouth daily.   hydroxychloroquine 200 MG tablet Commonly known as:  PLAQUENIL Take 400 mg by mouth daily.   hyoscyamine 0.125 MG SL tablet Commonly known as:  LEVSIN SL Take 1 tablet (0.125 mg total) by mouth every 6 (six) hours as needed. What changed:  reasons to take this   multivitamin tablet Take 1 tablet by mouth daily.   ondansetron 4 MG tablet Commonly known as:  ZOFRAN Take 1 tablet (4 mg total) every 6 (six) hours as needed by mouth for nausea.   predniSONE 5 MG tablet Commonly known as:  DELTASONE Take 5 mg by mouth daily.   PROBIOTIC PO Take by mouth daily.   simvastatin 40 MG tablet Commonly known as:  ZOCOR Take 1 tablet (40 mg total) by mouth at bedtime.   traMADol 50 MG tablet Commonly known as:  ULTRAM Take 1-2 tablets (50-100 mg total) by mouth every 6 (six) hours as needed for moderate pain or severe pain. What changed:    how much to take  reasons to take this       Significant Diagnostic Studies:  Results for orders placed or performed during the hospital encounter of 12/11/17 (from the past 72 hour(s))  CBC     Status: Abnormal   Collection Time: 12/15/17 10:15 AM  Result Value Ref Range   WBC 29.6 (H) 4.0 - 10.5 K/uL    Comment: CONSISTENT WITH PREVIOUS RESULT   RBC 3.23 (L) 3.87 - 5.11 MIL/uL   Hemoglobin 9.6 (L) 12.0 - 15.0 g/dL   HCT 28.9 (L) 36.0 - 46.0 %   MCV 89.5 78.0 - 100.0 fL   MCH 29.7 26.0 - 34.0 pg   MCHC  33.2 30.0 - 36.0 g/dL   RDW 15.6 (H) 11.5 - 15.5 %   Platelets 124 (L) 150 - 400 K/uL  Basic metabolic panel     Status: Abnormal   Collection Time: 12/17/17  4:32 AM  Result Value Ref Range   Sodium 134 (L) 135 - 145 mmol/L   Potassium 3.1 (L) 3.5 - 5.1 mmol/L   Chloride 95 (L) 101 -  111 mmol/L   CO2 30 22 - 32 mmol/L   Glucose, Bld 98 65 - 99 mg/dL   BUN 9 6 - 20 mg/dL   Creatinine, Ser 0.58 0.44 - 1.00 mg/dL   Calcium 9.2 8.9 - 10.3 mg/dL   GFR calc non Af Amer >60 >60 mL/min   GFR calc Af Amer >60 >60 mL/min    Comment: (NOTE) The eGFR has been calculated using the CKD EPI equation. This calculation has not been validated in all clinical situations. eGFR's persistently <60 mL/min signify possible Chronic Kidney Disease.    Anion gap 9 5 - 15  CBC     Status: Abnormal   Collection Time: 12/17/17  4:32 AM  Result Value Ref Range   WBC 25.3 (H) 4.0 - 10.5 K/uL   RBC 3.11 (L) 3.87 - 5.11 MIL/uL   Hemoglobin 9.2 (L) 12.0 - 15.0 g/dL   HCT 27.7 (L) 36.0 - 46.0 %   MCV 89.1 78.0 - 100.0 fL   MCH 29.6 26.0 - 34.0 pg   MCHC 33.2 30.0 - 36.0 g/dL   RDW 16.0 (H) 11.5 - 15.5 %   Platelets 130 (L) 150 - 400 K/uL    No results found.  Discharge Exam: Blood pressure (!) 119/55, pulse 87, temperature 98.4 F (36.9 C), temperature source Oral, resp. rate 15, height '5\' 4"'$  (1.626 m), weight 82 kg (180 lb 12.4 oz), SpO2 95 %.  General: Pt awake/alert/oriented x4 in No acute distress Eyes: PERRL, normal EOM.  Sclera clear.  No icterus Neuro: CN II-XII intact w/o focal sensory/motor deficits. Lymph: No head/neck/groin lymphadenopathy Psych:  No delerium/psychosis/paranoia HENT: Normocephalic, Mucus membranes moist.  No thrush Neck: Supple, No tracheal deviation Chest: No chest wall pain w good excursion CV:  Pulses intact.  Regular rhythm MS: Normal AROM mjr joints.  No obvious deformity Abdomen: Soft.  Nondistended.  Nontender.  Incisions closed.  Drain removed.  RLQ Prolene drain  stitch still in place.  Nursing to remove.  No evidence of peritonitis.  No incarcerated hernias. Ext:  SCDs BLE.  No mjr edema.  No cyanosis Skin: No petechiae / purpura  Past Medical History:  Diagnosis Date  . Abnormal chest x-ray   . Abnormal electrocardiogram   . Allergic rhinitis   . Cancer ([place])    Skin non melanoma  . Colon polyp    polypoid colorectal mucosa/adenomatous  . Complication of anesthesia   . Contact dermatitis due to poison ivy   . Cystitis   . Diverticulosis of colon   . Dysrhythmia    skips at times  . Fatty liver disease, nonalcoholic   . GERD (gastroesophageal reflux disease)    occasional  . Hearing loss   . Hemorrhoids   . Hyperlipidemia   . Hypertension   . Lymphocytosis   . Overweight(278.02)   . Pneumonia    2017  . PONV (postoperative nausea and vomiting)   . RA (rheumatoid arthritis) ([facility])    Rheumatoid  . Venous insufficiency     Past Surgical History:  Procedure Laterality Date  . CESAREAN SECTION     [phone]  . chemical and laser endovenous ablation of LE veins  2008, 2009, 2010, 2011   Dr Eilleen Kempf et al  . FOOT SURGERY     left  . INNER EAR SURGERY Right    childhood; right, ear drum repair  . LAPAROSCOPIC LYSIS OF ADHESIONS N/A 12/11/2017   Procedure: LAPAROSCOPIC LYSIS OF ADHESIONS;  Surgeon: Michael Boston, MD;  Location: [facility];  Service: General;  Laterality: N/A;  . NASAL SEPTUM SURGERY    . PROCTOSCOPY N/A 12/11/2017   Procedure: RIDGED PROCTOSCOPY;  Surgeon: Michael Boston, MD;  Location: [facility];  Service: General;  Laterality: N/A;  . TOTAL ABDOMINAL HYSTERECTOMY  12/86    Social History   Socioeconomic History  . Marital status: Married    Spouse name: [name]  . Number of children: 4  . Years of education: Not on file  . Highest education level: Not on file  Social Needs  . Financial resource strain: Not on file  . Food insecurity - worry: Not on file  . Food insecurity - inability: Not on file  .  Transportation needs - medical: Not on file  . Transportation needs - non-medical: Not on file  Occupational History  . Occupation: Community education officer: [employer]  Tobacco Use  . Smoking status: Former Smoker    Packs/day: 0.50    Years: 15.00    Pack years: 7.50    Types: Cigarettes    Last attempt to quit: 12/03/2001    Years since quitting: 16.0  . Smokeless tobacco: Never Used  Substance and Sexual Activity  . Alcohol use: Yes    Comment: occasional  . Drug use: No  . Sexual activity: Yes  Other Topics Concern  . Not on file  Social History Narrative   Married to [place], with children and grandchildren. 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 Hyperlipidemia Mother   . Hypertension Mother   . COPD Mother   . Arthritis Mother   . Heart disease Mother   . Atrial fibrillation Mother   . Diabetes Sister   . Healthy Sister   . Heart disease Sister        sister #1  . Breast cancer Other        half-sister  . Liver disease Sister        sister #1 - hx of liver transplant 14+ yrs ago  . Diabetes Maternal Uncle   . Heart failure Brother   . Diabetes Maternal Aunt        x 2  . Heart failure Son   . Colon cancer Neg Hx     Current Facility-Administered Medications  Medication Dose Route Frequency Provider Last Rate Last Dose  . 0.9 %  sodium chloride infusion  250 mL Intravenous PRN Michael Boston, MD      . acetaminophen (TYLENOL) tablet 1,000 mg  1,000 mg Oral QID Michael Boston, MD   1,000 mg at 12/16/17 1742  . alum & mag hydroxide-simeth (MAALOX/MYLANTA) 200-200-20 MG/5ML suspension 30 mL  30 mL Oral Q6H PRN Michael Boston, MD   30 mL at 12/15/17 1649  . aspirin EC tablet 81 mg  81 mg Oral Once per day on Mon Wed Fri Rosetta Rupnow, MD   81 mg at 12/16/17 1015  . calcium-vitamin D (OSCAL WITH D) 500-200 MG-UNIT per tablet 1 tablet  1 tablet Oral BID Michael Boston, MD   1 tablet  at 12/16/17 2200  . cholecalciferol (VITAMIN D) tablet 2,000 Units  2,000 Units Oral Daily Michael Boston, MD   2,000 Units at 12/16/17 [id number]  . diphenhydrAMINE (BENADRYL) 12.5 MG/5ML elixir 12.5 mg  12.5 mg Oral Q6H PRN Michael Boston, MD       Or  . diphenhydrAMINE (BENADRYL) injection 12.5  mg  12.5 mg Intravenous Q6H PRN Michael Boston, MD      . enoxaparin (LOVENOX) injection 40 mg  40 mg Subcutaneous Q24H Michael Boston, MD   40 mg at 12/16/17 0927  . feeding supplement (ENSURE SURGERY) liquid 237 mL  237 mL Oral BID BM Michael Boston, MD   237 mL at 12/16/17 1413  . fenofibrate tablet 160 mg  160 mg Oral Ardeen Fillers, MD   160 mg at 12/16/17 2200  . guaiFENesin-dextromethorphan (ROBITUSSIN DM) 100-10 MG/5ML syrup 10 mL  10 mL Oral Q4H PRN Michael Boston, MD      . hydrochlorothiazide (HYDRODIURIL) tablet 25 mg  25 mg Oral Daily Michael Boston, MD   25 mg at 12/16/17 7591  . hydrocortisone (ANUSOL-HC) 2.5 % rectal cream 1 application  1 application Topical QID PRN Michael Boston, MD      . hydrocortisone cream 1 % 1 application  1 application Topical TID PRN Michael Boston, MD      . HYDROmorphone (DILAUDID) injection 0.5-2 mg  0.5-2 mg Intravenous Q2H PRN Michael Boston, MD   1 mg at 12/14/17 2109  . hydroxychloroquine (PLAQUENIL) tablet 400 mg  400 mg Oral Daily Michael Boston, MD   400 mg at 12/16/17 6384  . hyoscyamine (LEVSIN SL) SL tablet 0.125 mg  0.125 mg Oral Q6H PRN Michael Boston, MD      . lip balm (CARMEX) ointment 1 application  1 application Topical BID Michael Boston, MD   1 application at 66/59/93 2200  . loratadine (CLARITIN) tablet 10 mg  10 mg Oral Daily Michael Boston, MD   10 mg at 12/16/17 0926  . magic mouthwash  15 mL Oral QID PRN Minda Ditto, RPH   15 mL at 12/16/17 0927  . menthol-cetylpyridinium (CEPACOL) lozenge 3 mg  1 lozenge Oral PRN Nyoka Cowden, Terri L, RPH       Or  . phenol (CHLORASEPTIC) mouth spray 1-2 spray  1-2 spray Mouth/Throat PRN Nyoka Cowden, Terri L, RPH      .  methocarbamol (ROBAXIN) 1,000 mg in dextrose 5 % 50 mL IVPB  1,000 mg Intravenous Q6H PRN Michael Boston, MD   Stopped at 12/12/17 0901  . methocarbamol (ROBAXIN) tablet 1,000 mg  1,000 mg Oral Q6H PRN Michael Boston, MD   1,000 mg at 12/15/17 2159  . metoCLOPramide (REGLAN) injection 5-10 mg  5-10 mg Intravenous Q6H PRN Michael Boston, MD   10 mg at 12/16/17 2058  . metoprolol tartrate (LOPRESSOR) injection 5 mg  5 mg Intravenous Q6H PRN Michael Boston, MD      . multivitamin with minerals tablet 1 tablet  1 tablet Oral Daily Michael Boston, MD   1 tablet at 12/16/17 0926  . ondansetron (ZOFRAN) tablet 4 mg  4 mg Oral Q6H PRN Michael Boston, MD   4 mg at 12/16/17 1746   Or  . ondansetron (ZOFRAN) injection 4 mg  4 mg Intravenous Q6H PRN Michael Boston, MD   4 mg at 12/15/17 2205  . polyvinyl alcohol (LIQUIFILM TEARS) 1.4 % ophthalmic solution 2 drop  2 drop Both Eyes PRN Michael Boston, MD      . predniSONE (DELTASONE) tablet 5 mg  5 mg Oral Daily Michael Boston, MD   5 mg at 12/16/17 0926  . psyllium (HYDROCIL/METAMUCIL) packet 1 packet  1 packet Oral BID Michael Boston, MD   1 packet at 12/16/17 2200  . saccharomyces boulardii (FLORASTOR) capsule 250 mg  250 mg Oral BID Michael Boston,  MD   250 mg at 12/16/17 2200  . simethicone (MYLICON) 40 AT/5.5DD suspension 40 mg  40 mg Oral QID Michael Boston, MD   40 mg at 12/16/17 2200  . simvastatin (ZOCOR) tablet 40 mg  40 mg Oral Ardeen Fillers, MD   40 mg at 12/16/17 2200  . sodium chloride flush (NS) 0.9 % injection 3 mL  3 mL Intravenous Gorden Harms, MD   3 mL at 12/16/17 2202  . sodium chloride flush (NS) 0.9 % injection 3 mL  3 mL Intravenous PRN Michael Boston, MD      . traMADol Veatrice Bourbon) tablet 50-100 mg  50-100 mg Oral Q6H PRN Michael Boston, MD   100 mg at 12/15/17 2159  . zolpidem (AMBIEN) tablet 5 mg  5 mg Oral QHS PRN Michael Boston, MD         Allergies  Allergen Reactions  . Remicade [Infliximab] Swelling and Other (See Comments)     Facial swelling, throat was closing up.     Signed: Morton Peters, M.D., F.A.C.S. Gastrointestinal and Minimally Invasive [facility] [address] [place], [place] [address] [phone] Main / Paging   12/17/2017, 7:37 AM

## 2017-12-17 NOTE — Progress Notes (Signed)
Pt alert and oriented. Tolerating diet. D/C instructions given, all questions answered. Pt d/cd home with spouse.

## 2017-12-19 ENCOUNTER — Ambulatory Visit: Payer: BLUE CROSS/BLUE SHIELD | Admitting: Internal Medicine

## 2017-12-19 ENCOUNTER — Encounter: Payer: Self-pay | Admitting: Family Medicine

## 2017-12-19 ENCOUNTER — Encounter: Payer: Self-pay | Admitting: Oncology

## 2017-12-23 ENCOUNTER — Telehealth: Payer: Self-pay | Admitting: Medical Oncology

## 2017-12-23 ENCOUNTER — Other Ambulatory Visit: Payer: Self-pay | Admitting: Family Medicine

## 2017-12-23 NOTE — Telephone Encounter (Signed)
Request to postpone 6 month f/u appt with Shadad this week due to recent abdominal surgery- told her I will cancel appt and find out when Dr Alen Blew wants to see her for f/u.

## 2017-12-24 ENCOUNTER — Telehealth: Payer: Self-pay | Admitting: *Deleted

## 2017-12-24 NOTE — Telephone Encounter (Signed)
Per Dr. Alen Blew, we can cancel her January MD appointment, but keep the lab appointment. Schedule her out six months for lab and MD appointment. Patient verbalized understanding. LOS sent to schedulers.

## 2017-12-24 NOTE — Telephone Encounter (Signed)
Next available in 01/2018

## 2017-12-26 ENCOUNTER — Ambulatory Visit: Payer: BLUE CROSS/BLUE SHIELD | Admitting: Oncology

## 2017-12-26 ENCOUNTER — Inpatient Hospital Stay: Payer: BLUE CROSS/BLUE SHIELD | Attending: Oncology

## 2017-12-26 DIAGNOSIS — D696 Thrombocytopenia, unspecified: Secondary | ICD-10-CM

## 2017-12-26 LAB — CBC WITH DIFFERENTIAL/PLATELET
BASOS PCT: 0 %
Basophils Absolute: 0 10*3/uL (ref 0.0–0.1)
EOS ABS: 0.2 10*3/uL (ref 0.0–0.5)
Eosinophils Relative: 1 %
HCT: 33.7 % — ABNORMAL LOW (ref 34.8–46.6)
Hemoglobin: 11 g/dL — ABNORMAL LOW (ref 11.6–15.9)
LYMPHS ABS: 2.3 10*3/uL (ref 0.9–3.3)
Lymphocytes Relative: 10 %
MCH: 28.8 pg (ref 25.1–34.0)
MCHC: 32.5 g/dL (ref 31.5–36.0)
MCV: 88.5 fL (ref 79.5–101.0)
MONO ABS: 5.3 10*3/uL — AB (ref 0.1–0.9)
Monocytes Relative: 23 %
NEUTROS PCT: 66 %
Neutro Abs: 15.2 10*3/uL — ABNORMAL HIGH (ref 1.5–6.5)
PLATELETS: 208 10*3/uL (ref 145–400)
RBC: 3.81 MIL/uL (ref 3.70–5.45)
RDW: 15.9 % (ref 11.2–16.1)
WBC: 23 10*3/uL — ABNORMAL HIGH (ref 3.9–10.3)

## 2017-12-26 LAB — COMPREHENSIVE METABOLIC PANEL
ALK PHOS: 75 U/L (ref 40–150)
ALT: 17 U/L (ref 0–55)
AST: 24 U/L (ref 5–34)
Albumin: 3.6 g/dL (ref 3.5–5.0)
Anion gap: 9 (ref 3–11)
BUN: 17 mg/dL (ref 7–26)
CALCIUM: 10.6 mg/dL — AB (ref 8.4–10.4)
CO2: 27 mmol/L (ref 22–29)
CREATININE: 1 mg/dL (ref 0.60–1.10)
Chloride: 103 mmol/L (ref 98–109)
GFR, EST NON AFRICAN AMERICAN: 57 mL/min — AB (ref >60)
Glucose, Bld: 99 mg/dL (ref 70–140)
Potassium: 4.2 mmol/L (ref 3.3–4.7)
SODIUM: 139 mmol/L (ref 136–145)
Total Bilirubin: 0.4 mg/dL (ref 0.2–1.2)
Total Protein: 7.2 g/dL (ref 6.4–8.3)

## 2017-12-27 ENCOUNTER — Telehealth: Payer: Self-pay | Admitting: Oncology

## 2017-12-27 NOTE — Telephone Encounter (Signed)
Scheduled appt per 1/22 sch message - patient is aware of apt date and time.

## 2018-01-15 DIAGNOSIS — M0609 Rheumatoid arthritis without rheumatoid factor, multiple sites: Secondary | ICD-10-CM | POA: Diagnosis not present

## 2018-01-16 DIAGNOSIS — M79642 Pain in left hand: Secondary | ICD-10-CM | POA: Diagnosis not present

## 2018-01-16 DIAGNOSIS — M2550 Pain in unspecified joint: Secondary | ICD-10-CM | POA: Diagnosis not present

## 2018-01-16 DIAGNOSIS — M79641 Pain in right hand: Secondary | ICD-10-CM | POA: Diagnosis not present

## 2018-01-16 DIAGNOSIS — M0609 Rheumatoid arthritis without rheumatoid factor, multiple sites: Secondary | ICD-10-CM | POA: Diagnosis not present

## 2018-01-31 DIAGNOSIS — Z1231 Encounter for screening mammogram for malignant neoplasm of breast: Secondary | ICD-10-CM | POA: Diagnosis not present

## 2018-01-31 DIAGNOSIS — Z01419 Encounter for gynecological examination (general) (routine) without abnormal findings: Secondary | ICD-10-CM | POA: Diagnosis not present

## 2018-01-31 DIAGNOSIS — Z9049 Acquired absence of other specified parts of digestive tract: Secondary | ICD-10-CM | POA: Insufficient documentation

## 2018-01-31 DIAGNOSIS — Z6832 Body mass index (BMI) 32.0-32.9, adult: Secondary | ICD-10-CM | POA: Diagnosis not present

## 2018-02-01 ENCOUNTER — Encounter: Payer: Self-pay | Admitting: Family Medicine

## 2018-02-01 DIAGNOSIS — Z13820 Encounter for screening for osteoporosis: Secondary | ICD-10-CM

## 2018-02-03 DIAGNOSIS — M0609 Rheumatoid arthritis without rheumatoid factor, multiple sites: Secondary | ICD-10-CM | POA: Diagnosis not present

## 2018-02-03 NOTE — Telephone Encounter (Signed)
Dr. Jonni Sanger,    Please see [place].   AP, LPN

## 2018-02-03 NOTE — Addendum Note (Signed)
Addended by: Billey Chang on: 02/03/2018 10:00 AM   Modules accepted: Orders

## 2018-02-04 ENCOUNTER — Telehealth: Payer: Self-pay | Admitting: Family Medicine

## 2018-02-04 DIAGNOSIS — M810 Age-related osteoporosis without current pathological fracture: Secondary | ICD-10-CM

## 2018-02-04 NOTE — Telephone Encounter (Signed)
Copied from [place]. Topic: Quick Communication - See Telephone Encounter >> Feb 04, 2018  4:25 PM Lolita Rieger, RMA wrote: CRM for notification. See Telephone encounter for:   02/04/18.Clarise Cruz from breast called and stated that the DX needs to be changed for pt bone density should be just osteoporosis please change dx code and reorder

## 2018-02-05 ENCOUNTER — Telehealth: Payer: Self-pay | Admitting: Oncology

## 2018-02-05 ENCOUNTER — Inpatient Hospital Stay: Payer: BLUE CROSS/BLUE SHIELD | Attending: Oncology | Admitting: Oncology

## 2018-02-05 ENCOUNTER — Telehealth: Payer: Self-pay | Admitting: Emergency Medicine

## 2018-02-05 ENCOUNTER — Inpatient Hospital Stay: Payer: BLUE CROSS/BLUE SHIELD

## 2018-02-05 ENCOUNTER — Other Ambulatory Visit: Payer: Self-pay | Admitting: *Deleted

## 2018-02-05 VITALS — BP 144/67 | HR 72 | Temp 97.8°F | Resp 18 | Ht 64.0 in | Wt 188.6 lb

## 2018-02-05 DIAGNOSIS — K5720 Diverticulitis of large intestine with perforation and abscess without bleeding: Secondary | ICD-10-CM

## 2018-02-05 DIAGNOSIS — D72828 Other elevated white blood cell count: Secondary | ICD-10-CM

## 2018-02-05 DIAGNOSIS — M069 Rheumatoid arthritis, unspecified: Secondary | ICD-10-CM | POA: Diagnosis not present

## 2018-02-05 DIAGNOSIS — D72821 Monocytosis (symptomatic): Secondary | ICD-10-CM

## 2018-02-05 LAB — CBC WITH DIFFERENTIAL/PLATELET
BASOS PCT: 0 %
Basophils Absolute: 0 10*3/uL (ref 0.0–0.1)
EOS PCT: 0 %
Eosinophils Absolute: 0 10*3/uL (ref 0.0–0.5)
HEMATOCRIT: 39 % (ref 34.8–46.6)
Hemoglobin: 12.6 g/dL (ref 11.6–15.9)
LYMPHS ABS: 2 10*3/uL (ref 0.9–3.3)
Lymphocytes Relative: 15 %
MCH: 29.8 pg (ref 25.1–34.0)
MCHC: 32.4 g/dL (ref 31.5–36.0)
MCV: 91.8 fL (ref 79.5–101.0)
MONOS PCT: 20 %
Monocytes Absolute: 2.6 10*3/uL — ABNORMAL HIGH (ref 0.1–0.9)
NEUTROS PCT: 65 %
Neutro Abs: 8.4 10*3/uL — ABNORMAL HIGH (ref 1.5–6.5)
Platelets: 103 10*3/uL — ABNORMAL LOW (ref 145–400)
RBC: 4.24 MIL/uL (ref 3.70–5.45)
RDW: 16.7 % — ABNORMAL HIGH (ref 11.2–14.5)
WBC: 13 10*3/uL — AB (ref 3.9–10.3)

## 2018-02-05 LAB — COMPREHENSIVE METABOLIC PANEL
ALBUMIN: 4.3 g/dL (ref 3.5–5.0)
ALT: 24 U/L (ref 0–55)
AST: 32 U/L (ref 5–34)
Alkaline Phosphatase: 50 U/L (ref 40–150)
Anion gap: 10 (ref 3–11)
BUN: 17 mg/dL (ref 7–26)
CHLORIDE: 105 mmol/L (ref 98–109)
CO2: 29 mmol/L (ref 22–29)
Calcium: 11 mg/dL — ABNORMAL HIGH (ref 8.4–10.4)
Creatinine, Ser: 1.1 mg/dL (ref 0.60–1.10)
GFR calc Af Amer: 58 mL/min — ABNORMAL LOW (ref >60)
GFR, EST NON AFRICAN AMERICAN: 50 mL/min — AB (ref >60)
Glucose, Bld: 73 mg/dL (ref 70–140)
POTASSIUM: 4.4 mmol/L (ref 3.5–5.1)
SODIUM: 144 mmol/L (ref 136–145)
Total Bilirubin: 0.5 mg/dL (ref 0.2–1.2)
Total Protein: 7.4 g/dL (ref 6.4–8.3)

## 2018-02-05 NOTE — Telephone Encounter (Signed)
New Order sent to [facility]

## 2018-02-05 NOTE — Telephone Encounter (Signed)
Appointments scheduled AVS/Calendar Printed per 3/6 los

## 2018-02-05 NOTE — Progress Notes (Signed)
Hematology and Oncology Follow Up Visit  Morgan [name] [id number] 30-Jun-1951 67 y.o. 02/05/2018 10:42 AM [name], MDAndy, [name], MD   Principle Diagnosis: 67 year old woman with leukocytosis and thrombocytopenia. This was diagnosed in January 2015 due to reactive etiology related to rheumatoid arthritis. No evidence of a primary hematological disorder.   Current therapy: Observation and surveillance.  Interim History:  Morgan [name] is here for a follow-up visit. Since the last visit, she underwent low anterior resection of a rectosigmoid colon because of diverticulitis and abscess formation and likely fistula formation. She had that surgery completed on 12/11/2017 by Dr. gross with excellent tolerance and no complications. She recovered reasonably well and have resumed work-related duties. Her appetite is improved and is eating active normal.  Her white cell count expectantly was elevated during this process related to her diverticulitis but is currently declining appropriately. She did have a flareup of her rheumatoid arthritis and required steroid intramuscular injection.   She has not reported any headaches, blurry vision, syncope or seizures. She does not report any fevers, chills or weight loss. She denies chest pain, palpitation, orthopnea or leg edema. She does not report any cough, wheezing or hemoptysis. She does not report any nausea, vomiting, abdominal pain or early satiety. She does not report any frequency urgency or hesitancy. She does not report any lymphadenopathy or petechiae. She does report occasional ecchymosis on her skin. Rest of her review of systems is negative.  Medications:  I personally reviewed her medication is unchanged today. Current Outpatient Medications  Medication Sig Dispense Refill  . acetaminophen (TYLENOL) 500 MG tablet Take 500 mg by mouth every 6 (six) hours as needed for moderate pain or headache.     Marland Kitchen aspirin 81 MG tablet Take 81 mg by mouth  3 (three) times a week.     . calcium-vitamin D (OSCAL WITH D) 500-200 MG-UNIT tablet Take 1 tablet by mouth 2 (two) times daily.    . Cholecalciferol (EQL VITAMIN D3) 2000 units CAPS Take 2,000 Units by mouth daily.    . fenofibrate 160 MG tablet TAKE ONE TABLET (160 MG TOTAL) BY MOUTH AT BEDTIME. 90 tablet 3  . fexofenadine (ALLEGRA) 180 MG tablet Take 180 mg by mouth daily.    . hydrochlorothiazide (HYDRODIURIL) 25 MG tablet TAKE 1 TABLET BY MOUTH EVERY DAY 90 tablet 3  . hydroxychloroquine (PLAQUENIL) 200 MG tablet Take 400 mg by mouth daily.    . Hypromellose (ARTIFICIAL TEARS OP) Place 1 drop into both eyes daily as needed (for dry eyes).    . Multiple Vitamin (MULTIVITAMIN) tablet Take 1 tablet by mouth daily.      . predniSONE (DELTASONE) 5 MG tablet Take 5 mg by mouth daily.  3  . Probiotic Product (PROBIOTIC PO) Take by mouth daily.    . simvastatin (ZOCOR) 40 MG tablet TAKE ONE TABLET (40 MG TOTAL) BY MOUTH AT BEDTIME. 90 tablet 3  . hyoscyamine (LEVSIN SL) 0.125 MG SL tablet Take 1 tablet (0.125 mg total) by mouth every 6 (six) hours as needed. (Patient not taking: Reported on 02/05/2018) 100 tablet 3  . ondansetron (ZOFRAN) 4 MG tablet Take 1 tablet (4 mg total) every 6 (six) hours as needed by mouth for nausea. (Patient not taking: Reported on 02/05/2018) 20 tablet 0  . traMADol (ULTRAM) 50 MG tablet Take 1-2 tablets (50-100 mg total) by mouth every 6 (six) hours as needed for moderate pain or severe pain. (Patient not taking: Reported on 02/05/2018)  30 tablet 0   No current facility-administered medications for this visit.      Allergies:  Allergies  Allergen Reactions  . Remicade [Infliximab] Swelling and Other (See Comments)    Facial swelling, throat was closing up.     Past Medical History, Surgical history, Social history, and Family History reviewed and unchanged.   Physical Exam: Blood pressure (!) 144/67, pulse 72, temperature 97.8 F (36.6 C), temperature source  Oral, resp. rate 18, height _0  (1.626 m), weight 188 lb 9.6 oz (85.5 kg), SpO2 100 %. ECOG: 0 General appearance: Alert, awake woman without distress. Head: Atraumatic without abnormalities. Eyes: No scleral icterus. Oropharynx: No oral ulcers or thrush. Lymph nodes: Cervical, supraclavicular, and axillary nodes normal. Heart:regular rate and rhythm, S1, S2 normal, no murmur, click, rub or gallop Lung: clear to auscultation without rhonchi, wheezes or dullness to percussion. Abdomin: Soft, nontender, nondistended with good bowel sounds. No rebound or guarding. Musculoskeletal: No joint deformity or effusion. Skin examination: Ecchymosis noted on her hands. No petechiae.  Lab Results: Lab Results  Component Value Date   WBC 13.0 (H) 02/05/2018   HGB 12.6 02/05/2018   HCT 39.0 02/05/2018   MCV 91.8 02/05/2018   PLT 103 (L) 02/05/2018     Chemistry      Component Value Date/Time   NA 139 12/26/2017 0846   NA 139 06/28/2017 0851   K 4.2 12/26/2017 0846   K 3.8 06/28/2017 0851   CL 103 12/26/2017 0846   CO2 27 12/26/2017 0846   CO2 28 06/28/2017 0851   BUN 17 12/26/2017 0846   BUN 21.4 06/28/2017 0851   CREATININE 1.00 12/26/2017 0846   CREATININE 1.0 06/28/2017 0851      Component Value Date/Time   CALCIUM 10.6 (H) 12/26/2017 0846   CALCIUM 10.8 (H) 06/28/2017 0851   ALKPHOS 75 12/26/2017 0846   ALKPHOS 49 06/28/2017 0851   AST 24 12/26/2017 0846   AST 29 06/28/2017 0851   ALT 17 12/26/2017 0846   ALT 19 06/28/2017 0851   BILITOT 0.4 12/26/2017 0846   BILITOT 0.68 06/28/2017 0851       Impression and Plan:  67 year old woman with the following issues:  1. Leukocytosis with Monocytosis: This appears to be reactive related to her autoimmune disease and steroids. Myelodysplastic syndrome or CMML would be considered less likely.    Her white cell count today was personally reviewed and appears to be close to normal after her recent elevation noted after  diverticulitis. Her total white cell count a month ago was 23,000 and currently is 13. These findings do not suggest hematological rather a reactive process.  The plan is to continue to monitor this periodically and consider bone marrow biopsy if any signs of hematological disorder is suspected.  2. Thrombocytopenia: Related to autoimmune etiology in her rheumatoid arthritis. Count is above 100,000 in no active bleeding.  3. Diverticulitis, status post surgical resection in January 2019. Fully healed at this time.  4. Followup: Will be in 6 months  15  minutes was spent with the patient face-to-face today.  More than 50% of time was dedicated to patient counseling, education and answering questions regarding her care.   Zola Button, MD 3/6/201910:42 AM

## 2018-02-05 NOTE — Telephone Encounter (Signed)
Order changed and faxed to breast center    AP, LPN

## 2018-02-11 ENCOUNTER — Encounter: Payer: Self-pay | Admitting: Family Medicine

## 2018-02-11 ENCOUNTER — Ambulatory Visit
Admission: RE | Admit: 2018-02-11 | Discharge: 2018-02-11 | Disposition: A | Discharge status: Home / Self Care | Payer: BLUE CROSS/BLUE SHIELD | Source: Ambulatory Visit | Attending: Family Medicine | Admitting: Family Medicine

## 2018-02-11 DIAGNOSIS — M810 Age-related osteoporosis without current pathological fracture: Secondary | ICD-10-CM

## 2018-02-11 DIAGNOSIS — Z780 Asymptomatic menopausal state: Secondary | ICD-10-CM | POA: Diagnosis not present

## 2018-03-04 ENCOUNTER — Ambulatory Visit (INDEPENDENT_AMBULATORY_CARE_PROVIDER_SITE_OTHER): Payer: BLUE CROSS/BLUE SHIELD | Admitting: Orthopaedic Surgery

## 2018-03-04 ENCOUNTER — Ambulatory Visit (INDEPENDENT_AMBULATORY_CARE_PROVIDER_SITE_OTHER): Payer: BLUE CROSS/BLUE SHIELD

## 2018-03-04 ENCOUNTER — Encounter (INDEPENDENT_AMBULATORY_CARE_PROVIDER_SITE_OTHER): Payer: Self-pay | Admitting: Orthopaedic Surgery

## 2018-03-04 VITALS — Ht 64.0 in | Wt 186.0 lb

## 2018-03-04 DIAGNOSIS — R0781 Pleurodynia: Secondary | ICD-10-CM | POA: Diagnosis not present

## 2018-03-04 DIAGNOSIS — M79601 Pain in right arm: Secondary | ICD-10-CM

## 2018-03-04 NOTE — Progress Notes (Signed)
Office Visit Note   Patient: Morgan [name]           Date of Birth: [birth year]           MRN: [id number] Visit Date: 03/04/2018              Requested by: [name], MD [address] [place], [place] [address] PCP: [name], MD   Assessment & Plan: Visit Diagnoses:  1. Rib pain on left side   2. Right arm pain     Plan: Impression is right shoulder and left rib contusion.  Recommend symptomatic treatment with heat and ice and Tylenol.  Patient instructed to return if there are any worsening.  Follow-Up Instructions: Return if symptoms worsen or fail to improve.   Orders:  Orders Placed This Encounter  Procedures  . XR Ribs Unilateral Left  . XR Humerus Right   No orders of the defined types were placed in this encounter.     Procedures: No procedures performed   Clinical Data: No additional findings.   Subjective: Chief Complaint  Patient presents with  . Chest - Pain    Left ribs  . Right Upper Arm - Pain    Patient is 67 year old female comes in for right shoulder and left thoracic pain status post mechanical fall 2 days ago while gardening.  She denies any chest pain or shortness of breath.  She denies any numbness and tingling.  She denies any neck pain or loss of consciousness.   Review of Systems  Constitutional: Negative.   HENT: Negative.   Eyes: Negative.   Respiratory: Negative.   Cardiovascular: Negative.   Endocrine: Negative.   Musculoskeletal: Negative.   Neurological: Negative.   Hematological: Negative.   Psychiatric/Behavioral: Negative.   All other systems reviewed and are negative.    Objective: Vital Signs: Ht 5\' 4"  (1.626 m)   Wt 186 lb (84.4 kg)   BMI 31.93 kg/m   Physical Exam  Constitutional: She is oriented to person, place, and time. She appears well-developed and well-nourished.  HENT:  Head: Normocephalic and atraumatic.  Eyes: EOM are normal.  Neck: Neck supple.  Pulmonary/Chest: Effort normal.    Abdominal: Soft.  Neurological: She is alert and oriented to person, place, and time.  Skin: Skin is warm. Capillary refill takes less than 2 seconds.  Psychiatric: She has a normal mood and affect. Her behavior is normal. Judgment and thought content normal.  Nursing note and vitals reviewed.   Ortho Exam Right shoulder exam shows full range of motion with mild discomfort.  She has mild tenderness to palpation of the deltoids.  Exam is otherwise nonfocal. Left chest exam shows no bruising or open skin lesions.  She does have tenderness directly over couple of the ribs.  She is not in any respiratory distress. Specialty Comments:  No specialty comments available.  Imaging: Xr Humerus Right  Result Date: 03/04/2018 No acute or structural abnormalities  Xr Ribs Unilateral Left  Result Date: 03/04/2018 No acute or structural abnormalities    PMFS History: Patient Active Problem List   Diagnosis Date Noted  . H/O partial resection of colon 01/31/2018  . Diverticulitis with abscess status post robotic low anterior rectosigmoid resection 12/11/2017 12/11/2017  . Abscess of sigmoid colon due to diverticulitis 11/04/2017  . Rheumatoid arthritis involving multiple sites with positive rheumatoid factor ([place]) 10/31/2017  . Immunosuppressed status ([place]) 10/08/2017  . Obesity with body mass index 30 or greater 01/25/2017  .  Current chronic use of systemic steroids 06/08/2016  . Uses hearing aid 12/09/2015  . Leukocytosis 08/27/2014  . Fatty liver disease, nonalcoholic 48/54/6270  . HEARING LOSS 10/31/2008  . Benign colon polyp 10/20/2008  . Essential hypertension 10/20/2008  . Diverticulosis of colon 10/20/2008  . Mixed hyperlipidemia 04/23/2008  . VENOUS INSUFFICIENCY 04/23/2008  . Allergic rhinitis 04/23/2008  . Gastroesophageal reflux disease without esophagitis 04/23/2008   Past Medical History:  Diagnosis Date  . Abnormal chest x-ray   . Abnormal electrocardiogram   . Allergic  rhinitis   . Cancer ([place])    Skin non melanoma  . Colon polyp    polypoid colorectal mucosa/adenomatous  . Complication of anesthesia   . Contact dermatitis due to poison ivy   . Cystitis   . Diverticulosis of colon   . Dysrhythmia    skips at times  . Fatty liver disease, nonalcoholic   . GERD (gastroesophageal reflux disease)    occasional  . Hearing loss   . Hemorrhoids   . Hyperlipidemia   . Hypertension   . Lymphocytosis   . Overweight(278.02)   . Pneumonia    2017  . PONV (postoperative nausea and vomiting)   . RA (rheumatoid arthritis) ([facility])    Rheumatoid  . Venous insufficiency     Family History  Problem Relation Age of Onset  . Hyperlipidemia Mother   . Hypertension Mother   . COPD Mother   . Arthritis Mother   . Heart disease Mother   . Atrial fibrillation Mother   . Diabetes Sister   . Healthy Sister   . Heart disease Sister        sister #1  . Breast cancer Other        half-sister  . Liver disease Sister        sister #1 - hx of liver transplant 14+ yrs ago  . Diabetes Maternal Uncle   . Heart failure Brother   . Diabetes Maternal Aunt        x 2  . Heart failure Son   . Colon cancer Neg Hx     Past Surgical History:  Procedure Laterality Date  . CESAREAN SECTION     [id number]  . chemical and laser endovenous ablation of LE veins  2008, 2009, 2010, 2011   Dr Eilleen Kempf et al  . FOOT SURGERY     left  . INNER EAR SURGERY Right    childhood; right, ear drum repair  . LAPAROSCOPIC LYSIS OF ADHESIONS N/A 12/11/2017   Procedure: LAPAROSCOPIC LYSIS OF ADHESIONS;  Surgeon: Michael Boston, MD;  Location: [facility];  Service: General;  Laterality: N/A;  . NASAL SEPTUM SURGERY    . PROCTOSCOPY N/A 12/11/2017   Procedure: RIDGED PROCTOSCOPY;  Surgeon: Michael Boston, MD;  Location: [facility];  Service: General;  Laterality: N/A;  . TOTAL ABDOMINAL HYSTERECTOMY  12/86   Social History   Occupational History  . Occupation: Community education officer:  [employer]  Tobacco Use  . Smoking status: Former Smoker    Packs/day: 0.50    Years: 15.00    Pack years: 7.50    Types: Cigarettes    Last attempt to quit: 12/03/2001    Years since quitting: 16.2  . Smokeless tobacco: Never Used  Substance and Sexual Activity  . Alcohol use: Yes    Comment: occasional  . Drug use: No  . Sexual activity: Yes

## 2018-03-12 DIAGNOSIS — Z79899 Other long term (current) drug therapy: Secondary | ICD-10-CM | POA: Diagnosis not present

## 2018-03-12 DIAGNOSIS — M0609 Rheumatoid arthritis without rheumatoid factor, multiple sites: Secondary | ICD-10-CM | POA: Diagnosis not present

## 2018-03-25 ENCOUNTER — Encounter: Payer: Self-pay | Admitting: Family Medicine

## 2018-05-07 ENCOUNTER — Encounter: Payer: Self-pay | Admitting: Family Medicine

## 2018-05-07 NOTE — Telephone Encounter (Signed)
Please advise. Patient does not have a appt for here as of yet.   Thanks  AP

## 2018-05-08 DIAGNOSIS — M0609 Rheumatoid arthritis without rheumatoid factor, multiple sites: Secondary | ICD-10-CM | POA: Diagnosis not present

## 2018-05-08 DIAGNOSIS — Z79899 Other long term (current) drug therapy: Secondary | ICD-10-CM | POA: Diagnosis not present

## 2018-05-12 ENCOUNTER — Telehealth: Payer: Self-pay | Admitting: Oncology

## 2018-05-12 NOTE — Telephone Encounter (Signed)
Patient called to reschedule  °

## 2018-05-14 DIAGNOSIS — Z79899 Other long term (current) drug therapy: Secondary | ICD-10-CM | POA: Diagnosis not present

## 2018-05-14 DIAGNOSIS — M0609 Rheumatoid arthritis without rheumatoid factor, multiple sites: Secondary | ICD-10-CM | POA: Diagnosis not present

## 2018-05-14 DIAGNOSIS — M2550 Pain in unspecified joint: Secondary | ICD-10-CM | POA: Diagnosis not present

## 2018-05-15 DIAGNOSIS — D045 Carcinoma in situ of skin of trunk: Secondary | ICD-10-CM | POA: Diagnosis not present

## 2018-05-15 DIAGNOSIS — D485 Neoplasm of uncertain behavior of skin: Secondary | ICD-10-CM | POA: Diagnosis not present

## 2018-05-15 DIAGNOSIS — L570 Actinic keratosis: Secondary | ICD-10-CM | POA: Diagnosis not present

## 2018-06-09 ENCOUNTER — Other Ambulatory Visit: Payer: Self-pay

## 2018-06-09 ENCOUNTER — Ambulatory Visit: Payer: BLUE CROSS/BLUE SHIELD | Admitting: Family Medicine

## 2018-06-09 ENCOUNTER — Encounter: Payer: Self-pay | Admitting: Family Medicine

## 2018-06-09 VITALS — BP 122/64 | HR 61 | Temp 98.1°F | Ht 64.0 in | Wt 191.8 lb

## 2018-06-09 DIAGNOSIS — I10 Essential (primary) hypertension: Secondary | ICD-10-CM

## 2018-06-09 NOTE — Progress Notes (Signed)
Subjective  CC:  Chief Complaint  Patient presents with  . Hypertension    doing well, no complaints    HPI: Morgan [name] is a 67 y.o. female who presents to the office today to address the problems listed above in the chief complaint.  Hypertension f/u: Control is good . Pt reports she is doing well. taking medications as instructed, no medication side effects noted, no TIAs, no chest pain on exertion, no dyspnea on exertion, no swelling of ankles. Feels well. She denies adverse effects from his BP medications. Compliance with medication is good.   Assessment  1. Essential hypertension      Plan    Hypertension f/u: BP control is well controlled. Continue hctz.  Education regarding management of these chronic disease states was given. Management strategies discussed on successive visits include dietary and exercise recommendations, goals of achieving and maintaining IBW, and lifestyle modifications aiming for adequate sleep and minimizing stressors.   Follow up: Return in about 6 months (around 12/10/2018) for complete physical.  No orders of the defined types were placed in this encounter.  No orders of the defined types were placed in this encounter.     BP Readings from Last 3 Encounters:  06/09/18 122/64  02/05/18 (!) 144/67  12/17/17 (!) 119/55   Wt Readings from Last 3 Encounters:  06/09/18 191 lb 12.8 oz (87 kg)  03/04/18 186 lb (84.4 kg)  02/05/18 188 lb 9.6 oz (85.5 kg)    Lab Results  Component Value Date   CHOL 139 12/09/2017   CHOL 144 11/18/2013   CHOL 149 01/13/2013   Lab Results  Component Value Date   HDL 44.60 12/09/2017   HDL 43.10 11/18/2013   HDL 51.70 01/13/2013   Lab Results  Component Value Date   LDLCALC 70 12/09/2017   LDLCALC 80 11/18/2013   LDLCALC 76 01/13/2013   Lab Results  Component Value Date   TRIG 121.0 12/09/2017   TRIG 103.0 11/18/2013   TRIG 106.0 01/13/2013   Lab Results  Component Value Date   CHOLHDL 3  12/09/2017   CHOLHDL 3 11/18/2013   CHOLHDL 3 01/13/2013   Lab Results  Component Value Date   LDLDIRECT 100.9 05/28/2007   LDLDIRECT 109.2 01/28/2007   Lab Results  Component Value Date   CREATININE 1.10 02/05/2018   BUN 17 02/05/2018   NA 144 02/05/2018   K 4.4 02/05/2018   CL 105 02/05/2018   CO2 29 02/05/2018    The 10-year ASCVD risk score Mikey Bussing DC Jr., et al., 2013) is: 9.6%   Values used to calculate the score:     Age: 81 years     Sex: Female     Is Non-Hispanic African American: No     Diabetic: No     Tobacco smoker: No     Systolic Blood Pressure: 017 mmHg     Is BP treated: Yes     HDL Cholesterol: 44.6 mg/dL     Total Cholesterol: 139 mg/dL  I reviewed the patients updated PMH, FH, and SocHx.    Patient Active Problem List   Diagnosis Date Noted  . H/O partial resection of colon 01/31/2018  . Diverticulitis with abscess status post robotic low anterior rectosigmoid resection 12/11/2017 12/11/2017  . Abscess of sigmoid colon due to diverticulitis 11/04/2017  . Rheumatoid arthritis involving multiple sites with positive rheumatoid factor ([place]) 10/31/2017  . Immunosuppressed status ([place]) 10/08/2017  . Obesity with body mass index 30 or  greater 01/25/2017  . Current chronic use of systemic steroids 06/08/2016  . Uses hearing aid 12/09/2015  . Leukocytosis 08/27/2014  . Fatty liver disease, nonalcoholic 23/30/0762  . HEARING LOSS 10/31/2008  . Benign colon polyp 10/20/2008  . Essential hypertension 10/20/2008  . Diverticulosis of colon 10/20/2008  . Mixed hyperlipidemia 04/23/2008  . VENOUS INSUFFICIENCY 04/23/2008  . Allergic rhinitis 04/23/2008  . Gastroesophageal reflux disease without esophagitis 04/23/2008    Allergies: Remicade [infliximab]  Social History: Patient  reports that she quit smoking about 16 years ago. Her smoking use included cigarettes. She has a 7.50 pack-year smoking history. She has never used smokeless tobacco. She reports that  she drinks alcohol. She reports that she does not use drugs.  Current Meds  Medication Sig  . acetaminophen (TYLENOL) 500 MG tablet Take 500 mg by mouth every 6 (six) hours as needed for moderate pain or headache.   Marland Kitchen aspirin 81 MG tablet Take 81 mg by mouth 3 (three) times a week.   . calcium-vitamin D (OSCAL WITH D) 500-200 MG-UNIT tablet Take 1 tablet by mouth 2 (two) times daily.  . Cholecalciferol (EQL VITAMIN D3) 2000 units CAPS Take 2,000 Units by mouth daily.  . fenofibrate 160 MG tablet TAKE ONE TABLET (160 MG TOTAL) BY MOUTH AT BEDTIME.  . fexofenadine (ALLEGRA) 180 MG tablet Take 180 mg by mouth daily.  . Golimumab (SIMPONI [place]) Inject into the skin.  . hydrochlorothiazide (HYDRODIURIL) 25 MG tablet TAKE 1 TABLET BY MOUTH EVERY DAY  . hydroxychloroquine (PLAQUENIL) 200 MG tablet Take 400 mg by mouth daily.  . hyoscyamine (LEVSIN SL) 0.125 MG SL tablet Take 1 tablet (0.125 mg total) by mouth every 6 (six) hours as needed.  . Hypromellose (ARTIFICIAL TEARS OP) Place 1 drop into both eyes daily as needed (for dry eyes).  . Multiple Vitamin (MULTIVITAMIN) tablet Take 1 tablet by mouth daily.    . ondansetron (ZOFRAN) 4 MG tablet Take 1 tablet (4 mg total) every 6 (six) hours as needed by mouth for nausea.  . predniSONE (DELTASONE) 5 MG tablet Take 5 mg by mouth daily.  . Probiotic Product (PROBIOTIC PO) Take by mouth daily.  . simvastatin (ZOCOR) 40 MG tablet TAKE ONE TABLET (40 MG TOTAL) BY MOUTH AT BEDTIME.  . traMADol (ULTRAM) 50 MG tablet Take 1-2 tablets (50-100 mg total) by mouth every 6 (six) hours as needed for moderate pain or severe pain.    Review of Systems: Cardiovascular: negative for chest pain, palpitations, leg swelling, orthopnea Respiratory: negative for SOB, wheezing or persistent cough Gastrointestinal: negative for abdominal pain Genitourinary: negative for dysuria or gross hematuria  Objective  Vitals: BP 122/64   Pulse 61   Temp 98.1 F (36.7 C)   Ht 5'  4" (1.626 m)   Wt 191 lb 12.8 oz (87 kg)   SpO2 97%   BMI 32.92 kg/m  General: no acute distress  Psych:  Alert and oriented, normal mood and affect HEENT:  Normocephalic, atraumatic, supple neck  Cardiovascular:  RRR without murmur. no edema Respiratory:  Good breath sounds bilaterally, CTAB with normal respiratory effort Skin:  Warm, no rashes Neurologic:   Mental status is normal  Commons side effects, risks, benefits, and alternatives for medications and treatment plan prescribed today were discussed, and the patient expressed understanding of the given instructions. Patient is instructed to call or message via MyChart if he/she has any questions or concerns regarding our treatment plan. No barriers to understanding were identified. We  discussed Red Flag symptoms and signs in detail. Patient expressed understanding regarding what to do in case of urgent or emergency type symptoms.   Medication list was reconciled, printed and provided to the patient in AVS. Patient instructions and summary information was reviewed with the patient as documented in the AVS. This note was prepared with assistance of Dragon voice recognition software. Occasional wrong-word or sound-a-like substitutions may have occurred due to the inherent limitations of voice recognition software

## 2018-06-09 NOTE — Patient Instructions (Signed)
Please return in January 2020 for your annual complete physical; please come fasting.   If you have any questions or concerns, please don't hesitate to send me a message via MyChart or call the office at [phone]. Thank you for visiting with [place] today! It's our pleasure caring for you.

## 2018-06-10 DIAGNOSIS — D045 Carcinoma in situ of skin of trunk: Secondary | ICD-10-CM | POA: Diagnosis not present

## 2018-06-18 ENCOUNTER — Encounter (HOSPITAL_BASED_OUTPATIENT_CLINIC_OR_DEPARTMENT_OTHER): Payer: Self-pay | Admitting: *Deleted

## 2018-06-18 ENCOUNTER — Ambulatory Visit: Payer: BLUE CROSS/BLUE SHIELD | Admitting: Physician Assistant

## 2018-06-18 ENCOUNTER — Emergency Department (HOSPITAL_BASED_OUTPATIENT_CLINIC_OR_DEPARTMENT_OTHER)
Admission: EM | Admit: 2018-06-18 | Discharge: 2018-06-18 | Disposition: A | Discharge status: Home / Self Care | Payer: BLUE CROSS/BLUE SHIELD | Attending: Emergency Medicine | Admitting: Emergency Medicine

## 2018-06-18 ENCOUNTER — Emergency Department (HOSPITAL_BASED_OUTPATIENT_CLINIC_OR_DEPARTMENT_OTHER): Payer: BLUE CROSS/BLUE SHIELD

## 2018-06-18 ENCOUNTER — Other Ambulatory Visit: Payer: Self-pay

## 2018-06-18 DIAGNOSIS — M069 Rheumatoid arthritis, unspecified: Secondary | ICD-10-CM | POA: Insufficient documentation

## 2018-06-18 DIAGNOSIS — R109 Unspecified abdominal pain: Secondary | ICD-10-CM | POA: Diagnosis not present

## 2018-06-18 DIAGNOSIS — R1031 Right lower quadrant pain: Secondary | ICD-10-CM | POA: Insufficient documentation

## 2018-06-18 DIAGNOSIS — K5900 Constipation, unspecified: Secondary | ICD-10-CM | POA: Diagnosis not present

## 2018-06-18 DIAGNOSIS — I10 Essential (primary) hypertension: Secondary | ICD-10-CM | POA: Diagnosis not present

## 2018-06-18 DIAGNOSIS — Z7982 Long term (current) use of aspirin: Secondary | ICD-10-CM | POA: Diagnosis not present

## 2018-06-18 DIAGNOSIS — Z79899 Other long term (current) drug therapy: Secondary | ICD-10-CM | POA: Insufficient documentation

## 2018-06-18 DIAGNOSIS — Z87891 Personal history of nicotine dependence: Secondary | ICD-10-CM | POA: Diagnosis not present

## 2018-06-18 LAB — COMPREHENSIVE METABOLIC PANEL
ALK PHOS: 44 U/L (ref 38–126)
ALT: 23 U/L (ref 0–44)
AST: 35 U/L (ref 15–41)
Albumin: 4.2 g/dL (ref 3.5–5.0)
Anion gap: 10 (ref 5–15)
BILIRUBIN TOTAL: 0.6 mg/dL (ref 0.3–1.2)
BUN: 19 mg/dL (ref 8–23)
CHLORIDE: 102 mmol/L (ref 98–111)
CO2: 27 mmol/L (ref 22–32)
Calcium: 9.8 mg/dL (ref 8.9–10.3)
Creatinine, Ser: 0.86 mg/dL (ref 0.44–1.00)
GLUCOSE: 99 mg/dL (ref 70–99)
POTASSIUM: 3.8 mmol/L (ref 3.5–5.1)
Sodium: 139 mmol/L (ref 135–145)
Total Protein: 7.3 g/dL (ref 6.5–8.1)

## 2018-06-18 LAB — URINALYSIS, ROUTINE W REFLEX MICROSCOPIC
Bilirubin Urine: NEGATIVE
GLUCOSE, UA: NEGATIVE mg/dL
Ketones, ur: NEGATIVE mg/dL
LEUKOCYTES UA: NEGATIVE
Nitrite: NEGATIVE
PROTEIN: NEGATIVE mg/dL
Specific Gravity, Urine: <1.005 — ABNORMAL LOW (ref 1.005–1.030)
pH: 7 (ref 5.0–8.0)

## 2018-06-18 LAB — CBC WITH DIFFERENTIAL/PLATELET
BASOS PCT: 1 %
Basophils Absolute: 0.1 10*3/uL (ref 0.0–0.1)
EOS PCT: 1 %
Eosinophils Absolute: 0.1 10*3/uL (ref 0.0–0.7)
HEMATOCRIT: 38.5 % (ref 36.0–46.0)
HEMOGLOBIN: 12.9 g/dL (ref 12.0–15.0)
Lymphocytes Relative: 15 %
Lymphs Abs: 1.7 10*3/uL (ref 0.7–4.0)
MCH: 31.9 pg (ref 26.0–34.0)
MCHC: 33.5 g/dL (ref 30.0–36.0)
MCV: 95.3 fL (ref 78.0–100.0)
MONO ABS: 2.8 10*3/uL — AB (ref 0.1–1.0)
MONOS PCT: 24 %
NEUTROS PCT: 59 %
Neutro Abs: 6.9 10*3/uL (ref 1.7–7.7)
Platelets: 85 10*3/uL — ABNORMAL LOW (ref 150–400)
RBC: 4.04 MIL/uL (ref 3.87–5.11)
RDW: 15.5 % (ref 11.5–15.5)
WBC: 11.6 10*3/uL — AB (ref 4.0–10.5)

## 2018-06-18 LAB — LIPASE, BLOOD: LIPASE: 42 U/L (ref 11–51)

## 2018-06-18 LAB — URINALYSIS, MICROSCOPIC (REFLEX)

## 2018-06-18 MED ORDER — DICYCLOMINE HCL 20 MG PO TABS: 20.0000 mg | ORAL_TABLET | Freq: Two times a day (BID) | ORAL | 0 refills | Status: DC

## 2018-06-18 MED ORDER — ONDANSETRON HCL 4 MG/2ML IJ SOLN: 4.0000 mg | Freq: Once | INTRAMUSCULAR | Status: DC | PRN

## 2018-06-18 MED ORDER — IOPAMIDOL (ISOVUE-300) INJECTION 61%
100.0000 mL | Freq: Once | INTRAVENOUS | Status: AC | PRN
  Administered 2018-06-18: 100 mL via INTRAVENOUS

## 2018-06-18 MED ORDER — FENTANYL CITRATE (PF) 100 MCG/2ML IJ SOLN: 100.0000 ug | Freq: Once | INTRAMUSCULAR | Status: DC | PRN

## 2018-06-18 MED ORDER — SODIUM CHLORIDE 0.9 % IV BOLUS
500.0000 mL | Freq: Once | INTRAVENOUS | Status: AC
  Administered 2018-06-18: 500 mL via INTRAVENOUS

## 2018-06-18 NOTE — ED Notes (Signed)
Pt refuses pain med at this time.

## 2018-06-18 NOTE — ED Triage Notes (Signed)
Pt reports right sided abd pain, intermittent yesterday, since this am constant, waxing and waning. Pt reports normal for her bm's, denies n/v/f

## 2018-06-18 NOTE — Discharge Instructions (Addendum)
Take 6 capfuls of miralax in 32 oz of fluid before bed.  Abdominal (belly) pain can be caused by many things. Your caregiver performed an examination and possibly ordered blood/urine tests and imaging (CT scan, x-rays, ultrasound). Many cases can be observed and treated at home after initial evaluation in the emergency department. Even though you are being discharged home, abdominal pain can be unpredictable. Therefore, you need a repeated exam if your pain does not resolve, returns, or worsens. Most patients with abdominal pain don't have to be admitted to the hospital or have surgery, but serious problems like appendicitis and gallbladder attacks can start out as nonspecific pain. Many abdominal conditions cannot be diagnosed in one visit, so follow-up evaluations are very important. SEEK IMMEDIATE MEDICAL ATTENTION IF: The pain does not go away or becomes severe.  A temperature above 101 develops.  Repeated vomiting occurs (multiple episodes).  The pain becomes localized to portions of the abdomen. The right side could possibly be appendicitis. In an adult, the left lower portion of the abdomen could be colitis or diverticulitis.  Blood is being passed in stools or vomit (bright red or black tarry stools).  Return also if you develop chest pain, difficulty breathing, dizziness or fainting, or become confused, poorly responsive, or inconsolable (young children).

## 2018-06-18 NOTE — ED Notes (Signed)
ED Provider at bedside. 

## 2018-06-18 NOTE — ED Provider Notes (Signed)
[facility] EMERGENCY DEPARTMENT Provider Note   CSN: [id number] Arrival date & time: 06/18/18  1015     History   Chief Complaint Chief Complaint  Patient presents with  . Abdominal Pain    HPI Morgan [name] is a 67 y.o. female who presents the emergency department chief complaint of abdominal pain.  She is a past medical history of rheumatoid arthritis and chronic immune suppression on prednisone and golimumab.  Patient also has a surgical history of partial colectomy secondary to colitis.  She had bilateral oophorectomy at that time and reanastomosis of the colon.  Patient complains of right lower quadrant abdominal pain.  She had onset of symptoms around 12 PM yesterday.  They were mild at the time but have worsened progressively.  She states that the pain is constant, 4 out of 10 but at times worse and sharp.  Nothing seems to make the pain worse or better.  She has not taken anything for the pain.  She denies urinary symptoms, vaginal symptoms, fever or chills.  She denies diarrhea or constipation.  HPI  Past Medical History:  Diagnosis Date  . Abnormal chest x-ray   . Abnormal electrocardiogram   . Allergic rhinitis   . Cancer ([place])    Skin non melanoma  . Colon polyp    polypoid colorectal mucosa/adenomatous  . Complication of anesthesia   . Contact dermatitis due to poison ivy   . Cystitis   . Diverticulosis of colon   . Dysrhythmia    skips at times  . Fatty liver disease, nonalcoholic   . GERD (gastroesophageal reflux disease)    occasional  . Hearing loss   . Hemorrhoids   . Hyperlipidemia   . Hypertension   . Lymphocytosis   . Overweight(278.02)   . Pneumonia    2017  . PONV (postoperative nausea and vomiting)   . RA (rheumatoid arthritis) ([facility])    Rheumatoid  . Venous insufficiency     Patient Active Problem List   Diagnosis Date Noted  . H/O partial resection of colon 01/31/2018  . Diverticulitis with abscess status post robotic low  anterior rectosigmoid resection 12/11/2017 12/11/2017  . Abscess of sigmoid colon due to diverticulitis 11/04/2017  . Rheumatoid arthritis involving multiple sites with positive rheumatoid factor ([place]) 10/31/2017  . Immunosuppressed status ([place]) 10/08/2017  . Obesity with body mass index 30 or greater 01/25/2017  . Current chronic use of systemic steroids 06/08/2016  . Uses hearing aid 12/09/2015  . Leukocytosis 08/27/2014  . Fatty liver disease, nonalcoholic 58/08/9832  . HEARING LOSS 10/31/2008  . Benign colon polyp 10/20/2008  . Essential hypertension 10/20/2008  . Diverticulosis of colon 10/20/2008  . Mixed hyperlipidemia 04/23/2008  . VENOUS INSUFFICIENCY 04/23/2008  . Allergic rhinitis 04/23/2008  . Gastroesophageal reflux disease without esophagitis 04/23/2008    Past Surgical History:  Procedure Laterality Date  . CESAREAN SECTION     [phone]  . chemical and laser endovenous ablation of LE veins  2008, 2009, 2010, 2011   Dr Eilleen Kempf et al  . FOOT SURGERY     left  . INNER EAR SURGERY Right    childhood; right, ear drum repair  . LAPAROSCOPIC LYSIS OF ADHESIONS N/A 12/11/2017   Procedure: LAPAROSCOPIC LYSIS OF ADHESIONS;  Surgeon: Michael Boston, MD;  Location: [facility];  Service: General;  Laterality: N/A;  . NASAL SEPTUM SURGERY    . PROCTOSCOPY N/A 12/11/2017   Procedure: RIDGED PROCTOSCOPY;  Surgeon: Michael Boston, MD;  Location:  [facility] ORS;  Service: General;  Laterality: N/A;  . TOTAL ABDOMINAL HYSTERECTOMY  12/86     OB History   None      Home Medications    Prior to Admission medications   Medication Sig Start Date End Date Taking? Authorizing Provider  acetaminophen (TYLENOL) 500 MG tablet Take 500 mg by mouth every 6 (six) hours as needed for moderate pain or headache.     [provider]  aspirin 81 MG tablet Take 81 mg by mouth 3 (three) times a week.     [provider]  calcium-vitamin D (OSCAL WITH D) 500-200 MG-UNIT tablet Take 1  tablet by mouth 2 (two) times daily.    [provider]  Cholecalciferol (EQL VITAMIN D3) 2000 units CAPS Take 2,000 Units by mouth daily.    [provider]  clobetasol cream (TEMOVATE) 0.05 % APPLY TO AFFECTED AREA TWICE A DAY AS NEEDED 06/03/18   [provider]  fenofibrate 160 MG tablet TAKE ONE TABLET (160 MG TOTAL) BY MOUTH AT BEDTIME. 12/23/17   [name], MD  fexofenadine (ALLEGRA) 180 MG tablet Take 180 mg by mouth daily.    [provider]  Golimumab ([place]) Inject into the skin.    [provider]  hydrochlorothiazide (HYDRODIURIL) 25 MG tablet TAKE 1 TABLET BY MOUTH EVERY DAY 12/23/17   [name], MD  hydroxychloroquine (PLAQUENIL) 200 MG tablet Take 400 mg by mouth daily.    [provider]  hyoscyamine (LEVSIN SL) 0.125 MG SL tablet Take 1 tablet (0.125 mg total) by mouth every 6 (six) hours as needed. 09/30/17   Levin Erp, PA  Hypromellose (ARTIFICIAL TEARS OP) Place 1 drop into both eyes daily as needed (for dry eyes).    [provider]  Multiple Vitamin (MULTIVITAMIN) tablet Take 1 tablet by mouth daily.      [provider]  ondansetron (ZOFRAN) 4 MG tablet Take 1 tablet (4 mg total) every 6 (six) hours as needed by mouth for nausea. 10/14/17   [name], MD  predniSONE (DELTASONE) 5 MG tablet Take 5 mg by mouth daily. 08/24/14   [provider]  Probiotic Product (PROBIOTIC PO) Take by mouth daily.    [provider]  simvastatin (ZOCOR) 40 MG tablet TAKE ONE TABLET (40 MG TOTAL) BY MOUTH AT BEDTIME. 12/23/17   [name], MD  traMADol (ULTRAM) 50 MG tablet Take 1-2 tablets (50-100 mg total) by mouth every 6 (six) hours as needed for moderate pain or severe pain. 12/11/17   Michael Boston, MD    Family History Family History  Problem Relation Age of Onset  . Hyperlipidemia Mother   . Hypertension Mother   . COPD Mother   . Arthritis Mother   . Heart  disease Mother   . Atrial fibrillation Mother   . Diabetes Sister   . Healthy Sister   . Heart disease Sister        sister #1  . Breast cancer Other        half-sister  . Liver disease Sister        sister #1 - hx of liver transplant 14+ yrs ago  . Diabetes Maternal Uncle   . Heart failure Brother   . Diabetes Maternal Aunt        x 2  . Heart failure Son   . Colon cancer Neg Hx     Social History Social History   Tobacco Use  .  Smoking status: Former Smoker    Packs/day: 0.50    Years: 15.00    Pack years: 7.50    Types: Cigarettes    Last attempt to quit: 12/03/2001    Years since quitting: 16.5  . Smokeless tobacco: Never Used  Substance Use Topics  . Alcohol use: Yes    Comment: occasional  . Drug use: No     Allergies   Remicade [infliximab]   Review of Systems Review of Systems  Ten systems reviewed and are negative for acute change, except as noted in the HPI.   Physical Exam Updated Vital Signs Ht 5\' 4"  (1.626 m)   Wt 85.7 kg (189 lb)   BMI 32.44 kg/m   Physical Exam  Constitutional: She is oriented to person, place, and time. She appears well-developed and well-nourished. No distress.  HENT:  Head: Normocephalic and atraumatic.  Eyes: Conjunctivae are normal. No scleral icterus.  Neck: Normal range of motion.  Cardiovascular: Normal rate, regular rhythm and normal heart sounds. Exam reveals no gallop and no friction rub.  No murmur heard. Pulmonary/Chest: Effort normal and breath sounds normal. No respiratory distress.  Abdominal: Soft. Bowel sounds are normal. She exhibits no distension and no mass. There is tenderness in the right lower quadrant. There is no rebound, no guarding and no CVA tenderness.  + rovsings - rebound, obturator, or psoas sign  Neurological: She is alert and oriented to person, place, and time.  Skin: Skin is warm and dry. Capillary refill takes less than 2 seconds. She is not diaphoretic.  Psychiatric: Her behavior is  normal.  Nursing note and vitals reviewed.    ED Treatments / Results  Labs (all labs ordered are listed, but only abnormal results are displayed) Labs Reviewed - No data to display  EKG None  Radiology No results found.  Procedures Procedures (including critical care time)  Medications Ordered in ED Medications - No data to display   Initial Impression / Assessment and Plan / ED Course  I have reviewed the triage vital signs and the nursing notes.  Pertinent labs & imaging results that were available during my care of the patient were reviewed by me and considered in my medical decision making (see chart for details).     Patient CT scan shows large stool burden and otherwise no acute findings to account for the patient's abdominal pain today.  Her labs are reviewed and are otherwise reassuring.  Patient is advised to use her MiraLAX at home and do 6 capfuls in 32 ounces of water tonight before bed and then 1-3 capfuls daily and 12 ounces of water to continue moving her bowels effectively.  I discussed return precautions with the patient.  I doubt any emergent cause of her abdominal pain including appendicitis, diverticulitis, ovarian torsion, kidney stone.  Patient appears appropriate for discharge at this time.  I discussed return precautions.  Final Clinical Impressions(s) / ED Diagnoses   Final diagnoses:  None    ED Discharge Orders    None       Margarita Mail, PA-C 06/18/18 Zoe Lan, MD 06/19/18 1030

## 2018-06-20 ENCOUNTER — Telehealth: Payer: Self-pay | Admitting: Internal Medicine

## 2018-06-20 NOTE — Telephone Encounter (Signed)
Reviewed with pt that the ER recommend that she take 1-3 doses of miralax daily to make sure she is moving her bowels. Pt verbalized understanding.

## 2018-06-30 ENCOUNTER — Encounter: Payer: Self-pay | Admitting: Family Medicine

## 2018-07-03 DIAGNOSIS — M0609 Rheumatoid arthritis without rheumatoid factor, multiple sites: Secondary | ICD-10-CM | POA: Diagnosis not present

## 2018-08-08 ENCOUNTER — Other Ambulatory Visit: Payer: BLUE CROSS/BLUE SHIELD

## 2018-08-08 ENCOUNTER — Ambulatory Visit: Payer: BLUE CROSS/BLUE SHIELD | Admitting: Oncology

## 2018-08-12 ENCOUNTER — Inpatient Hospital Stay: Payer: BLUE CROSS/BLUE SHIELD | Admitting: Oncology

## 2018-08-12 ENCOUNTER — Telehealth: Payer: Self-pay

## 2018-08-12 ENCOUNTER — Inpatient Hospital Stay: Payer: BLUE CROSS/BLUE SHIELD | Attending: Oncology

## 2018-08-12 VITALS — BP 135/73 | HR 61 | Temp 98.1°F | Resp 18 | Ht 64.0 in | Wt 196.2 lb

## 2018-08-12 DIAGNOSIS — K5720 Diverticulitis of large intestine with perforation and abscess without bleeding: Secondary | ICD-10-CM

## 2018-08-12 DIAGNOSIS — D72821 Monocytosis (symptomatic): Secondary | ICD-10-CM

## 2018-08-12 DIAGNOSIS — D72829 Elevated white blood cell count, unspecified: Secondary | ICD-10-CM | POA: Diagnosis not present

## 2018-08-12 DIAGNOSIS — D696 Thrombocytopenia, unspecified: Secondary | ICD-10-CM | POA: Insufficient documentation

## 2018-08-12 DIAGNOSIS — M069 Rheumatoid arthritis, unspecified: Secondary | ICD-10-CM | POA: Diagnosis not present

## 2018-08-12 DIAGNOSIS — D72828 Other elevated white blood cell count: Secondary | ICD-10-CM

## 2018-08-12 LAB — CBC WITH DIFFERENTIAL (CANCER CENTER ONLY)
Basophils Absolute: 0.1 10*3/uL (ref 0.0–0.1)
Basophils Relative: 1 %
EOS PCT: 1 %
Eosinophils Absolute: 0 10*3/uL (ref 0.0–0.5)
HEMATOCRIT: 38.9 % (ref 34.8–46.6)
Hemoglobin: 12.9 g/dL (ref 11.6–15.9)
LYMPHS PCT: 18 %
Lymphs Abs: 1.7 10*3/uL (ref 0.9–3.3)
MCH: 31.4 pg (ref 25.1–34.0)
MCHC: 33.2 g/dL (ref 31.5–36.0)
MCV: 94.6 fL (ref 79.5–101.0)
MONO ABS: 2.6 10*3/uL — AB (ref 0.1–0.9)
MONOS PCT: 27 %
NEUTROS ABS: 5.3 10*3/uL (ref 1.5–6.5)
Neutrophils Relative %: 53 %
PLATELETS: 84 10*3/uL — AB (ref 145–400)
RBC: 4.11 MIL/uL (ref 3.70–5.45)
RDW: 15.8 % — AB (ref 11.2–14.5)
WBC: 9.7 10*3/uL (ref 3.9–10.3)

## 2018-08-12 NOTE — Telephone Encounter (Signed)
Printed avs and calender of upcoming appointment/. Per 9/10 los 

## 2018-08-12 NOTE — Progress Notes (Signed)
Hematology and Oncology Follow Up Visit  Morgan [name] [id number] 11-15-1951 67 y.o. 08/12/2018 8:26 AM Morgan [name], MDAndy, Morgan Fetch, MD   Principle Diagnosis: 67 year old woman with:  1. Leukocytosis: Diagnosed in January 2015 and likely related to reactive process.  2.  Thrombocytopenia: Fluctuating in nature and related to autoimmune process in the setting of rheumatoid arthritis.    Current therapy: Active surveillance.  Interim History:  Morgan [name] presents today for a follow-up visit.  Since the last visit, she reports no major changes in her health.  Her rheumatoid arthritis continues to be under control currently on prednisone 5 mg daily with Plaquenil.  He denies any recent bleeding issues including epistaxis, hematochezia or melena.  Her performance status and activity level remains unchanged.  Her appetite is excellent and she has gained weight.     She has not reported any headaches, blurry vision, syncope or seizures.  She denies any dizziness or alteration in mental status.  She does not report any fevers, chills or weight loss. She denies chest pain, palpitation, orthopnea or leg edema. She does not report any cough, wheezing or hemoptysis. She does not report any nausea, vomiting, abdominal pain.  She denies any change in bowel habits.. She does not report any frequency urgency or hesitancy. She does not report any lymphadenopathy or petechiae.  She denies any bleeding or clotting tendencies.  She denies any changes in her mood.  Rest of her review of systems is negative.  Medications:  I personally reviewed her medication is unchanged today. Current Outpatient Medications  Medication Sig Dispense Refill  . acetaminophen (TYLENOL) 500 MG tablet Take 500 mg by mouth every 6 (six) hours as needed for moderate pain or headache.     Marland Kitchen aspirin 81 MG tablet Take 81 mg by mouth 3 (three) times a week.     . calcium-vitamin D (OSCAL WITH D) 500-200 MG-UNIT tablet Take 1  tablet by mouth 2 (two) times daily.    . Cholecalciferol (EQL VITAMIN D3) 2000 units CAPS Take 2,000 Units by mouth daily.    . clobetasol cream (TEMOVATE) 0.05 % APPLY TO AFFECTED AREA TWICE A DAY AS NEEDED  3  . dicyclomine (BENTYL) 20 MG tablet Take 1 tablet (20 mg total) by mouth 2 (two) times daily. 20 tablet 0  . fenofibrate 160 MG tablet TAKE ONE TABLET (160 MG TOTAL) BY MOUTH AT BEDTIME. 90 tablet 3  . fexofenadine (ALLEGRA) 180 MG tablet Take 180 mg by mouth daily.    . Golimumab ([place]PON[place]) Inject into the skin.    . hydrochlorothiazide (HYDRODIURIL) 25 MG tablet TAKE 1 TABLET BY MOUTH EVERY DAY 90 tablet 3  . hydroxychloroquine (PLAQUENIL) 200 MG tablet Take 400 mg by mouth daily.    . hyoscyamine (LEVSIN SL) 0.125 MG SL tablet Take 1 tablet (0.125 mg total) by mouth every 6 (six) hours as needed. 100 tablet 3  . Hypromellose (ARTIFICIAL TEARS OP) Place 1 drop into both eyes daily as needed (for dry eyes).    . Multiple Vitamin (MULTIVITAMIN) tablet Take 1 tablet by mouth daily.      . ondansetron (ZOFRAN) 4 MG tablet Take 1 tablet (4 mg total) every 6 (six) hours as needed by mouth for nausea. 20 tablet 0  . predniSONE (DELTASONE) 5 MG tablet Take 5 mg by mouth daily.  3  . Probiotic Product (PROBIOTIC PO) Take by mouth daily.    . simvastatin (ZOCOR) 40 MG tablet TAKE ONE TABLET (  40 MG TOTAL) BY MOUTH AT BEDTIME. 90 tablet 3  . traMADol (ULTRAM) 50 MG tablet Take 1-2 tablets (50-100 mg total) by mouth every 6 (six) hours as needed for moderate pain or severe pain. 30 tablet 0   No current facility-administered medications for this visit.      Allergies:  Allergies  Allergen Reactions  . Remicade [Infliximab] Swelling and Other (See Comments)    Facial swelling, throat was closing up.     Past Medical History, Surgical history, Social history, and Family History reviewed and unchanged.   Physical Exam: Blood pressure 135/73, pulse 61, temperature 98.1 F (36.7 C),  temperature source Oral, resp. rate 18, height 5\' 4"  (1.626 m), weight 196 lb 3.2 oz (89 kg), SpO2 100 %.    ECOG: 0   General appearance: Comfortable appearing without any discomfort Head: Normocephalic without any trauma Oropharynx: Mucous membranes are moist and pink without any thrush or ulcers. Eyes: Pupils are equal and round reactive to light. Lymph nodes: No cervical, supraclavicular, inguinal or axillary lymphadenopathy.   Heart:regular rate and rhythm.  S1 and S2 without leg edema. Lung: Clear without any rhonchi or wheezes.  No dullness to percussion. Abdomin: Soft, nontender, nondistended with good bowel sounds.  No hepatosplenomegaly. Musculoskeletal: No joint deformity or effusion.  Full range of motion noted. Neurological: No deficits noted on motor, sensory and deep tendon reflex exam. Skin: No petechial rash or dryness.  Appeared moist.   Lab Results: Lab Results  Component Value Date   WBC 11.6 (H) 06/18/2018   HGB 12.9 06/18/2018   HCT 38.5 06/18/2018   MCV 95.3 06/18/2018   PLT 85 (L) 06/18/2018     Chemistry      Component Value Date/Time   NA 139 06/18/2018 1134   NA 139 06/28/2017 0851   K 3.8 06/18/2018 1134   K 3.8 06/28/2017 0851   CL 102 06/18/2018 1134   CO2 27 06/18/2018 1134   CO2 28 06/28/2017 0851   BUN 19 06/18/2018 1134   BUN 21.4 06/28/2017 0851   CREATININE 0.86 06/18/2018 1134   CREATININE 1.0 06/28/2017 0851      Component Value Date/Time   CALCIUM 9.8 06/18/2018 1134   CALCIUM 10.8 (H) 06/28/2017 0851   ALKPHOS 44 06/18/2018 1134   ALKPHOS 49 06/28/2017 0851   AST 35 06/18/2018 1134   AST 29 06/28/2017 0851   ALT 23 06/18/2018 1134   ALT 19 06/28/2017 0851   BILITOT 0.6 06/18/2018 1134   BILITOT 0.68 06/28/2017 0851       Impression and Plan:  67 year old woman with:   1. Leukocytosis with Monocytosis: Her white cell count is normal today with 9.7 increased monocytosis.  The differential diagnosis was reviewed again  and appears to be reactive in nature related to her chronic inflammatory condition rather than a hematological disorder.  Other etiologies such as multiple operative disorder or myelodysplastic syndrome are possibilities but are considered less likely.  I recommended continue monitoring at this time given these findings.    2. Thrombocytopenia: Likely autoimmune etiology related to rheumatoid arthritis.  Her platelet count around 84,000 without any active bleeding.  I recommended continued monitoring at this time.  The natural course of these findings were reviewed today and treatment options were also discussed.  These were include higher doses of prednisone, rituximab and IVIG.  I also discussed indication for treatments including platelet count of less than 50,000 or active bleeding.  3.  Rheumatoid arthritis: Appears under  control at this time with Plaquenil and low-dose prednisone.  4. Followup: Will be in 6 months  15  minutes was spent with the patient face-to-face today.  More than 50% of time was dedicated to reviewing her laboratory data, the natural course of this disease, differential diagnosis and management options.   Zola Button, MD 9/10/20198:26 AM

## 2018-08-15 ENCOUNTER — Other Ambulatory Visit: Payer: Self-pay | Admitting: Physician Assistant

## 2018-08-28 DIAGNOSIS — Z79899 Other long term (current) drug therapy: Secondary | ICD-10-CM | POA: Diagnosis not present

## 2018-08-28 DIAGNOSIS — M0609 Rheumatoid arthritis without rheumatoid factor, multiple sites: Secondary | ICD-10-CM | POA: Diagnosis not present

## 2018-09-01 ENCOUNTER — Encounter: Payer: Self-pay | Admitting: Family Medicine

## 2018-09-09 ENCOUNTER — Encounter: Payer: Self-pay | Admitting: Family Medicine

## 2018-09-11 ENCOUNTER — Ambulatory Visit (INDEPENDENT_AMBULATORY_CARE_PROVIDER_SITE_OTHER): Payer: BLUE CROSS/BLUE SHIELD

## 2018-09-11 DIAGNOSIS — Z23 Encounter for immunization: Secondary | ICD-10-CM | POA: Diagnosis not present

## 2018-09-15 ENCOUNTER — Ambulatory Visit: Payer: BLUE CROSS/BLUE SHIELD

## 2018-10-09 DIAGNOSIS — H7201 Central perforation of tympanic membrane, right ear: Secondary | ICD-10-CM | POA: Diagnosis not present

## 2018-10-23 DIAGNOSIS — M0609 Rheumatoid arthritis without rheumatoid factor, multiple sites: Secondary | ICD-10-CM | POA: Diagnosis not present

## 2018-10-27 ENCOUNTER — Other Ambulatory Visit: Payer: Self-pay | Admitting: Otolaryngology

## 2018-10-28 ENCOUNTER — Encounter (HOSPITAL_BASED_OUTPATIENT_CLINIC_OR_DEPARTMENT_OTHER): Payer: Self-pay

## 2018-10-28 ENCOUNTER — Other Ambulatory Visit: Payer: Self-pay

## 2018-10-29 ENCOUNTER — Encounter (HOSPITAL_BASED_OUTPATIENT_CLINIC_OR_DEPARTMENT_OTHER)
Admission: RE | Admit: 2018-10-29 | Discharge: 2018-10-29 | Disposition: A | Discharge status: Home / Self Care | Payer: BLUE CROSS/BLUE SHIELD | Source: Ambulatory Visit | Attending: Otolaryngology | Admitting: Otolaryngology

## 2018-10-29 DIAGNOSIS — I872 Venous insufficiency (chronic) (peripheral): Secondary | ICD-10-CM | POA: Diagnosis not present

## 2018-10-29 DIAGNOSIS — Z87891 Personal history of nicotine dependence: Secondary | ICD-10-CM | POA: Diagnosis not present

## 2018-10-29 DIAGNOSIS — I10 Essential (primary) hypertension: Secondary | ICD-10-CM | POA: Diagnosis not present

## 2018-10-29 DIAGNOSIS — H7291 Unspecified perforation of tympanic membrane, right ear: Secondary | ICD-10-CM | POA: Diagnosis not present

## 2018-10-29 DIAGNOSIS — M069 Rheumatoid arthritis, unspecified: Secondary | ICD-10-CM | POA: Diagnosis not present

## 2018-10-29 DIAGNOSIS — K219 Gastro-esophageal reflux disease without esophagitis: Secondary | ICD-10-CM | POA: Diagnosis not present

## 2018-10-29 DIAGNOSIS — E785 Hyperlipidemia, unspecified: Secondary | ICD-10-CM | POA: Diagnosis not present

## 2018-10-29 DIAGNOSIS — Z7982 Long term (current) use of aspirin: Secondary | ICD-10-CM | POA: Diagnosis not present

## 2018-10-29 DIAGNOSIS — Z7952 Long term (current) use of systemic steroids: Secondary | ICD-10-CM | POA: Diagnosis not present

## 2018-10-29 LAB — BASIC METABOLIC PANEL
ANION GAP: 10 (ref 5–15)
BUN: 18 mg/dL (ref 8–23)
CALCIUM: 10.1 mg/dL (ref 8.9–10.3)
CO2: 27 mmol/L (ref 22–32)
Chloride: 102 mmol/L (ref 98–111)
Creatinine, Ser: 1.07 mg/dL — ABNORMAL HIGH (ref 0.44–1.00)
GFR calc Af Amer: >60 mL/min (ref >60)
GFR, EST NON AFRICAN AMERICAN: 53 mL/min — AB (ref >60)
GLUCOSE: 81 mg/dL (ref 70–99)
POTASSIUM: 4.2 mmol/L (ref 3.5–5.1)
Sodium: 139 mmol/L (ref 135–145)

## 2018-11-03 ENCOUNTER — Other Ambulatory Visit: Payer: Self-pay

## 2018-11-03 ENCOUNTER — Encounter (HOSPITAL_BASED_OUTPATIENT_CLINIC_OR_DEPARTMENT_OTHER): Payer: Self-pay | Admitting: Anesthesiology

## 2018-11-03 ENCOUNTER — Ambulatory Visit (HOSPITAL_BASED_OUTPATIENT_CLINIC_OR_DEPARTMENT_OTHER): Payer: BLUE CROSS/BLUE SHIELD | Admitting: Anesthesiology

## 2018-11-03 ENCOUNTER — Encounter (HOSPITAL_BASED_OUTPATIENT_CLINIC_OR_DEPARTMENT_OTHER)
Admission: RE | Disposition: A | Discharge status: Home / Self Care | Payer: Self-pay | Source: Ambulatory Visit | Attending: Otolaryngology

## 2018-11-03 ENCOUNTER — Ambulatory Visit (HOSPITAL_BASED_OUTPATIENT_CLINIC_OR_DEPARTMENT_OTHER)
Admission: RE | Admit: 2018-11-03 | Discharge: 2018-11-03 | Disposition: A | Discharge status: Home / Self Care | Payer: BLUE CROSS/BLUE SHIELD | Source: Ambulatory Visit | Attending: Otolaryngology | Admitting: Otolaryngology

## 2018-11-03 DIAGNOSIS — I872 Venous insufficiency (chronic) (peripheral): Secondary | ICD-10-CM | POA: Insufficient documentation

## 2018-11-03 DIAGNOSIS — Z7982 Long term (current) use of aspirin: Secondary | ICD-10-CM | POA: Insufficient documentation

## 2018-11-03 DIAGNOSIS — I10 Essential (primary) hypertension: Secondary | ICD-10-CM | POA: Diagnosis not present

## 2018-11-03 DIAGNOSIS — M069 Rheumatoid arthritis, unspecified: Secondary | ICD-10-CM | POA: Diagnosis not present

## 2018-11-03 DIAGNOSIS — Z87891 Personal history of nicotine dependence: Secondary | ICD-10-CM | POA: Insufficient documentation

## 2018-11-03 DIAGNOSIS — Z7952 Long term (current) use of systemic steroids: Secondary | ICD-10-CM | POA: Diagnosis not present

## 2018-11-03 DIAGNOSIS — E785 Hyperlipidemia, unspecified: Secondary | ICD-10-CM | POA: Diagnosis not present

## 2018-11-03 DIAGNOSIS — K219 Gastro-esophageal reflux disease without esophagitis: Secondary | ICD-10-CM | POA: Insufficient documentation

## 2018-11-03 DIAGNOSIS — H7201 Central perforation of tympanic membrane, right ear: Secondary | ICD-10-CM | POA: Diagnosis not present

## 2018-11-03 DIAGNOSIS — H7291 Unspecified perforation of tympanic membrane, right ear: Secondary | ICD-10-CM | POA: Diagnosis not present

## 2018-11-03 DIAGNOSIS — E782 Mixed hyperlipidemia: Secondary | ICD-10-CM | POA: Diagnosis not present

## 2018-11-03 HISTORY — PX: TYMPANOPLASTY: SHX33

## 2018-11-03 SURGERY — TYMPANOPLASTY
Anesthesia: General | Site: Ear | Laterality: Right

## 2018-11-03 MED ORDER — EPHEDRINE SULFATE 50 MG/ML IJ SOLN
INTRAMUSCULAR | Status: DC | PRN
  Administered 2018-11-03 (×2): 10 mg via INTRAVENOUS

## 2018-11-03 MED ORDER — CIPROFLOXACIN-DEXAMETHASONE 0.3-0.1 % OT SUSP
OTIC | Status: DC | PRN
  Administered 2018-11-03: 7 [drp] via OTIC

## 2018-11-03 MED ORDER — FENTANYL CITRATE (PF) 100 MCG/2ML IJ SOLN
INTRAMUSCULAR | Status: AC
  Filled 2018-11-03: qty 2

## 2018-11-03 MED ORDER — BACITRACIN ZINC 500 UNIT/GM EX OINT
TOPICAL_OINTMENT | CUTANEOUS | Status: DC | PRN
  Administered 2018-11-03: 1 via TOPICAL

## 2018-11-03 MED ORDER — DEXAMETHASONE SODIUM PHOSPHATE 4 MG/ML IJ SOLN
INTRAMUSCULAR | Status: DC | PRN
  Administered 2018-11-03: 10 mg via INTRAVENOUS

## 2018-11-03 MED ORDER — BACITRACIN ZINC 500 UNIT/GM EX OINT
TOPICAL_OINTMENT | CUTANEOUS | Status: AC
  Filled 2018-11-03: qty 28.35

## 2018-11-03 MED ORDER — PROMETHAZINE HCL 25 MG/ML IJ SOLN: 6.2500 mg | INTRAMUSCULAR | Status: DC | PRN

## 2018-11-03 MED ORDER — BACITRACIN-NEOMYCIN-POLYMYXIN 400-5-5000 EX OINT
TOPICAL_OINTMENT | CUTANEOUS | Status: AC
  Filled 2018-11-03: qty 1

## 2018-11-03 MED ORDER — SCOPOLAMINE 1 MG/3DAYS TD PT72: 1.0000 | MEDICATED_PATCH | Freq: Once | TRANSDERMAL | Status: DC | PRN

## 2018-11-03 MED ORDER — MIDAZOLAM HCL 2 MG/2ML IJ SOLN
1.0000 mg | INTRAMUSCULAR | Status: DC | PRN
  Administered 2018-11-03: 1 mg via INTRAVENOUS

## 2018-11-03 MED ORDER — LIDOCAINE-EPINEPHRINE 2 %-1:100000 IJ SOLN
INTRAMUSCULAR | Status: AC
  Filled 2018-11-03: qty 1

## 2018-11-03 MED ORDER — OXYMETAZOLINE HCL 0.05 % NA SOLN
NASAL | Status: AC
  Filled 2018-11-03: qty 15

## 2018-11-03 MED ORDER — CIPROFLOXACIN-DEXAMETHASONE 0.3-0.1 % OT SUSP
OTIC | Status: AC
  Filled 2018-11-03: qty 7.5

## 2018-11-03 MED ORDER — DEXAMETHASONE SODIUM PHOSPHATE 10 MG/ML IJ SOLN
INTRAMUSCULAR | Status: AC
  Filled 2018-11-03: qty 1

## 2018-11-03 MED ORDER — CEFAZOLIN SODIUM-DEXTROSE 2-4 GM/100ML-% IV SOLN
INTRAVENOUS | Status: AC
  Filled 2018-11-03: qty 100

## 2018-11-03 MED ORDER — ONDANSETRON HCL 4 MG/2ML IJ SOLN
INTRAMUSCULAR | Status: DC | PRN
  Administered 2018-11-03: 4 mg via INTRAVENOUS

## 2018-11-03 MED ORDER — ONDANSETRON HCL 4 MG/2ML IJ SOLN
INTRAMUSCULAR | Status: AC
  Filled 2018-11-03: qty 2

## 2018-11-03 MED ORDER — LACTATED RINGERS IV SOLN
INTRAVENOUS | Status: DC
  Administered 2018-11-03: 10:00:00 via INTRAVENOUS

## 2018-11-03 MED ORDER — HYDROCODONE-ACETAMINOPHEN 5-325 MG PO TABS: 1.0000 | ORAL_TABLET | Freq: Four times a day (QID) | ORAL | 0 refills | Status: DC | PRN

## 2018-11-03 MED ORDER — LIDOCAINE-EPINEPHRINE 1 %-1:100000 IJ SOLN
INTRAMUSCULAR | Status: AC
  Filled 2018-11-03: qty 1

## 2018-11-03 MED ORDER — MIDAZOLAM HCL 2 MG/2ML IJ SOLN
INTRAMUSCULAR | Status: AC
  Filled 2018-11-03: qty 2

## 2018-11-03 MED ORDER — FENTANYL CITRATE (PF) 100 MCG/2ML IJ SOLN
50.0000 ug | INTRAMUSCULAR | Status: AC | PRN
  Administered 2018-11-03: 25 ug via INTRAVENOUS
  Administered 2018-11-03: 50 ug via INTRAVENOUS
  Administered 2018-11-03: 25 ug via INTRAVENOUS

## 2018-11-03 MED ORDER — FENTANYL CITRATE (PF) 100 MCG/2ML IJ SOLN
25.0000 ug | INTRAMUSCULAR | Status: DC | PRN
  Administered 2018-11-03: 50 ug via INTRAVENOUS

## 2018-11-03 MED ORDER — LIDOCAINE 2% (20 MG/ML) 5 ML SYRINGE
INTRAMUSCULAR | Status: DC | PRN
  Administered 2018-11-03: 60 mg via INTRAVENOUS
  Administered 2018-11-03: 40 mg via INTRAVENOUS

## 2018-11-03 MED ORDER — CEFAZOLIN SODIUM-DEXTROSE 2-4 GM/100ML-% IV SOLN
2.0000 g | INTRAVENOUS | Status: AC
  Administered 2018-11-03: 2 g via INTRAVENOUS

## 2018-11-03 MED ORDER — PROPOFOL 10 MG/ML IV BOLUS
INTRAVENOUS | Status: DC | PRN
  Administered 2018-11-03: 140 mg via INTRAVENOUS

## 2018-11-03 MED ORDER — LIDOCAINE-EPINEPHRINE 1 %-1:100000 IJ SOLN
INTRAMUSCULAR | Status: DC | PRN
  Administered 2018-11-03: 3 mL

## 2018-11-03 MED ORDER — SCOPOLAMINE 1 MG/3DAYS TD PT72
MEDICATED_PATCH | TRANSDERMAL | Status: AC
  Filled 2018-11-03: qty 1

## 2018-11-03 SURGICAL SUPPLY — 71 items
BENZOIN TINCTURE PRP APPL 2/3 (GAUZE/BANDAGES/DRESSINGS) ×2 IMPLANT
BLADE CLIPPER SURG (BLADE) IMPLANT
BLADE EAR TYMPAN 2.5 60D BEAV (BLADE) ×2 IMPLANT
BLADE EYE SICKLE 84 5 BEAV (BLADE) ×2 IMPLANT
BLADE NEEDLE 3 SS STRL (BLADE) IMPLANT
BLADE SURG 15 STRL LF DISP TIS (BLADE) IMPLANT
BLADE SURG 15 STRL SS (BLADE)
BNDG CONFORM 3 STRL LF (GAUZE/BANDAGES/DRESSINGS) IMPLANT
BNDG CONFORM 4 STRL LF (GAUZE/BANDAGES/DRESSINGS) IMPLANT
BNDG GAUZE ELAST 4 BULKY (GAUZE/BANDAGES/DRESSINGS) IMPLANT
CANISTER SUCT 1200ML W/VALVE (MISCELLANEOUS) ×2 IMPLANT
CLEANER CAUTERY TIP 5X5 PAD (MISCELLANEOUS) IMPLANT
CORD BIPOLAR FORCEPS 12FT (ELECTRODE) IMPLANT
COTTONBALL LRG STERILE PKG (GAUZE/BANDAGES/DRESSINGS) ×2 IMPLANT
COVER WAND RF STERILE (DRAPES) IMPLANT
DECANTER SPIKE VIAL GLASS SM (MISCELLANEOUS) ×2 IMPLANT
DEPRESSOR TONGUE BLADE STERILE (MISCELLANEOUS) IMPLANT
DERMABOND ADVANCED (GAUZE/BANDAGES/DRESSINGS) ×1
DERMABOND ADVANCED .7 DNX12 (GAUZE/BANDAGES/DRESSINGS) ×1 IMPLANT
DRAIN PENROSE 1/4X12 LTX STRL (WOUND CARE) ×2 IMPLANT
DRAPE EENT ADH APERT 31X51 STR (DRAPES) ×2 IMPLANT
DRAPE MICROSCOPE WILD 40.5X102 (DRAPES) ×2 IMPLANT
DRAPE SURG 17X23 STRL (DRAPES) ×2 IMPLANT
DRESSING ADAPTIC 1/2  N-ADH (PACKING) IMPLANT
DRSG GLASSCOCK MASTOID ADT (GAUZE/BANDAGES/DRESSINGS) IMPLANT
DRSG GLASSCOCK MASTOID PED (GAUZE/BANDAGES/DRESSINGS) IMPLANT
DRSG TELFA 3X8 NADH (GAUZE/BANDAGES/DRESSINGS) IMPLANT
ELECT COATED BLADE 2.86 ST (ELECTRODE) ×2 IMPLANT
ELECT REM PT RETURN 9FT ADLT (ELECTROSURGICAL) ×2
ELECTRODE REM PT RTRN 9FT ADLT (ELECTROSURGICAL) ×1 IMPLANT
GAUZE 4X4 16PLY RFD (DISPOSABLE) IMPLANT
GAUZE PACKING 1/2X5YD (GAUZE/BANDAGES/DRESSINGS) IMPLANT
GAUZE SPONGE 4X4 12PLY STRL (GAUZE/BANDAGES/DRESSINGS) IMPLANT
GAUZE SPONGE 4X4 12PLY STRL LF (GAUZE/BANDAGES/DRESSINGS) IMPLANT
GAUZE VASELINE FOILPK 1/2 X 72 (GAUZE/BANDAGES/DRESSINGS) IMPLANT
GLOVE BIO SURGEON STRL SZ7.5 (GLOVE) ×2 IMPLANT
GOWN STRL REUS W/ TWL LRG LVL3 (GOWN DISPOSABLE) ×2 IMPLANT
GOWN STRL REUS W/TWL LRG LVL3 (GOWN DISPOSABLE) ×2
IV CATH AUTO 14GX1.75 SAFE ORG (IV SOLUTION) IMPLANT
IV NS 500ML (IV SOLUTION) ×1
IV NS 500ML BAXH (IV SOLUTION) ×1 IMPLANT
IV SET EXT 30 76VOL 4 MALE LL (IV SETS) ×2 IMPLANT
NDL SAFETY ECLIPSE 18X1.5 (NEEDLE) ×1 IMPLANT
NEEDLE HYPO 18GX1.5 SHARP (NEEDLE) ×1
NEEDLE HYPO 25X1 1.5 SAFETY (NEEDLE) ×2 IMPLANT
NS IRRIG 1000ML POUR BTL (IV SOLUTION) ×2 IMPLANT
PACK BASIN DAY SURGERY FS (CUSTOM PROCEDURE TRAY) ×2 IMPLANT
PACK ENT DAY SURGERY (CUSTOM PROCEDURE TRAY) ×2 IMPLANT
PAD CLEANER CAUTERY TIP 5X5 (MISCELLANEOUS)
PENCIL BUTTON HOLSTER BLD 10FT (ELECTRODE) ×2 IMPLANT
PUNCH BIOPSY DERMAL 4MM (MISCELLANEOUS) IMPLANT
SHEET MEDIUM DRAPE 40X70 STRL (DRAPES) IMPLANT
SLEEVE SCD COMPRESS KNEE MED (MISCELLANEOUS) IMPLANT
SPONGE SURGIFOAM ABS GEL 12-7 (HEMOSTASIS) IMPLANT
STAPLER VISISTAT 35W (STAPLE) IMPLANT
SUT ETHILON 2 0 FS 18 (SUTURE) IMPLANT
SUT ETHILON 5 0 P 3 18 (SUTURE)
SUT NYLON ETHILON 5-0 P-3 1X18 (SUTURE) IMPLANT
SUT PLAIN 5 0 P 3 18 (SUTURE) IMPLANT
SUT PLAIN 6 0 TG1408 (SUTURE) IMPLANT
SUT PROLENE 5 0 PS 2 (SUTURE) IMPLANT
SUT SILK 3 0 SH 30 (SUTURE) IMPLANT
SUT VIC AB 3-0 FS2 27 (SUTURE) IMPLANT
SUT VIC AB 5-0 P-3 18X BRD (SUTURE) IMPLANT
SUT VIC AB 5-0 P3 18 (SUTURE)
SUT VICRYL 4-0 PS2 18IN ABS (SUTURE) ×2 IMPLANT
SYR 5ML LL (SYRINGE) IMPLANT
SYR BULB 3OZ (MISCELLANEOUS) IMPLANT
TAPE UMBILICAL 1/8 X36 TWILL (MISCELLANEOUS) IMPLANT
TOWEL GREEN STERILE FF (TOWEL DISPOSABLE) ×4 IMPLANT
TRAY DSU PREP LF (CUSTOM PROCEDURE TRAY) ×2 IMPLANT

## 2018-11-03 NOTE — Brief Op Note (Signed)
11/03/2018  11:23 AM  PATIENT:  [name]  67 y.o. female  PRE-OPERATIVE DIAGNOSIS:  Central perforation of tympanic membrane  POST-OPERATIVE DIAGNOSIS:  Central perforation of tympanic membrane  PROCEDURE:  Procedure(s): TYMPANOPLASTY (Right)  SURGEON:  Surgeon(s) and Role:    Melida Quitter, MD - Primary  PHYSICIAN ASSISTANT:   ASSISTANTS: none   ANESTHESIA:   general  EBL: Minimal  BLOOD ADMINISTERED:none  DRAINS: none   LOCAL MEDICATIONS USED:  LIDOCAINE   SPECIMEN:  No Specimen  DISPOSITION OF SPECIMEN:  N/A  COUNTS:  YES  TOURNIQUET:  * No tourniquets in log *  DICTATION: .Other Dictation: Dictation Number [id number]  PLAN OF CARE: Discharge to home after PACU  PATIENT DISPOSITION:  PACU - hemodynamically stable.   Delay start of Pharmacological VTE agent (>24hrs) due to surgical blood loss or risk of bleeding: no

## 2018-11-03 NOTE — Anesthesia Procedure Notes (Signed)
Procedure Name: LMA Insertion Date/Time: 11/03/2018 9:54 AM Performed by: Maryella Shivers, CRNA Pre-anesthesia Checklist: Patient identified, Emergency Drugs available, Suction available and Patient being monitored Patient Re-evaluated:Patient Re-evaluated prior to induction Oxygen Delivery Method: Circle system utilized Preoxygenation: Pre-oxygenation with 100% oxygen Induction Type: IV induction Ventilation: Mask ventilation without difficulty LMA: LMA inserted LMA Size: 4.0 Number of attempts: 1 Airway Equipment and Method: Bite block Placement Confirmation: positive ETCO2 Tube secured with: Tape Dental Injury: Teeth and Oropharynx as per pre-operative assessment

## 2018-11-03 NOTE — Anesthesia Preprocedure Evaluation (Addendum)
Anesthesia Evaluation  Patient identified by MRN, date of birth, ID band Patient awake    Reviewed: Allergy & Precautions, NPO status , Patient's Chart, lab work & pertinent test results  History of Anesthesia Complications (+) PONV and history of anesthetic complications  Airway Mallampati: II  TM Distance: >3 FB Neck ROM: Full    Dental  (+) Teeth Intact, Dental Advisory Given   Pulmonary former smoker,    Pulmonary exam normal breath sounds clear to auscultation       Cardiovascular hypertension, Pt. on medications Normal cardiovascular exam Rhythm:Regular Rate:Normal     Neuro/Psych negative neurological ROS     GI/Hepatic Neg liver ROS, GERD  Controlled,  Endo/Other  BMI 33.5  Renal/GU negative Renal ROS     Musculoskeletal  (+) Arthritis , Rheumatoid disorders,    Abdominal   Peds  Hematology negative hematology ROS (+)   Anesthesia Other Findings Day of surgery medications reviewed with the patient.  Reproductive/Obstetrics                            Anesthesia Physical Anesthesia Plan  ASA: II  Anesthesia Plan: General   Post-op Pain Management:    Induction: Intravenous  PONV Risk Score and Plan: 4 or greater and Ondansetron, Dexamethasone, Treatment may vary due to age or medical condition and Midazolam  Airway Management Planned: LMA  Additional Equipment:   Intra-op Plan:   Post-operative Plan: Extubation in OR  Informed Consent: I have reviewed the patients History and Physical, chart, labs and discussed the procedure including the risks, benefits and alternatives for the proposed anesthesia with the patient or authorized representative who has indicated his/her understanding and acceptance.   Dental advisory given  Plan Discussed with: CRNA  Anesthesia Plan Comments:       Anesthesia Quick Evaluation

## 2018-11-03 NOTE — Discharge Instructions (Signed)

## 2018-11-03 NOTE — Op Note (Signed)
NAME: Morgan [name], Morgan [name] MEDICAL RECORD ZO:[id number] ACCOUNT [id number] DATE OF BIRTH:December 01, 1951 FACILITY: [facility] LOCATION: [facility] PHYSICIAN:Cali Cuartas Guido Sander, MD  OPERATIVE REPORT  DATE OF PROCEDURE:  11/03/2018  PREOPERATIVE DIAGNOSIS:  Right tympanic membrane perforation.  POSTOPERATIVE DIAGNOSIS:  Right tympanic membrane perforation.  PROCEDURE:  Right type 1 tympanoplasty.  SURGEON:  Melida Quitter, MD  ANESTHESIA:  General endotracheal anesthesia.  COMPLICATIONS:  None.  INDICATIONS:  The patient is a 67 year old female who underwent right tympanoplasty in her youth and more recently was noted to have a recurrence of perforation about 2 years ago that has persisted.  She has intermittent otorrhea.  The perforation does  not appear to be affecting her hearing by audiological exam.  She presents to the operating room for surgical management.  FINDINGS:  Much of the tympanic membrane had sclerosis.  There is a perforation of the inferior tympanic membrane with thin squamous covering over approximately 2/3 of the perforation and the perforation totalling about 30% of the tympanic membrane.  The  middle ear structures were normal including intact ossicles.  DESCRIPTION OF PROCEDURE:  The patient was identified in the holding room, informed consent having been obtained including discussion of risks, benefits, alternatives.  The patient was brought to the operative suite and put the operative table in supine  position.  Anesthesia was induced and the patient was intubated by the anesthesia team without difficulty.  The patient was given intravenous antibiotics during the case.  The eyes were taped closed.  The right ear was inspected.  Some of the hair was  shaved from behind the ear and the ear was then prepped and draped in sterile fashion.  The postauricular incision site was injected with local anesthetic and then the canal was injected in 4 quadrants using a speculum.  After a couple  minutes, the ear  was inspected under the operating microscope.  The perforation edge was freshened with a Rosen needle and cup forceps removing the thin layer of squamous material from the tympanic membrane perforation.  After this was completed, the postauricular  incision was made with a 15 blade scalpel extending superiorly from the previous scar.  Bovie electrocautery was used to divide tissues down to the temporalis fascia.  Soft tissues were elevated off the fascia.  A rectangular piece of fascia was then  collected using a 15 blade followed by scissors.  This was placed in the fascial press for a couple of minutes and then opened under a heat lamp.  The graft site was copiously irrigated with saline.  It was closed in the subcutaneous layer using 4-0  Vicryl suture in a simple interrupted fashion and Dermabond on the skin.  The ear was inspected again, under the operating microscope and radial incisions were made using a sickle knives at 7 o'clock and 12 o'clock.  The posterior, circumferential  incision was made with a tympanoplasty blade and the tympanomeatal flap was then elevated including the annulus out of its groove keeping the chordae tympani intact.  With the flap raised anteriorly, the graft was then inspected and trimmed into shape.   It was then laid down into the middle ear and passed anteriorly under the perforation.  The middle ear was then packed with Ciprodex soaked Gelfoam pressing the graft laterally.  The graft and flap were laid down and the graft was seen to be out of  position and was then removed and placed back in the fascial  and back under the heat lamp.  The  middle ear was cleared of Gelfoam.  The graft was then again laid into the middle ear and pressed anteriorly with the middle ear packed with the Gelfoam once  again.  This time, with laying down of the flap and graft, the graft was found to be in perfect position.  The medial canal was then packed with Gelfoam as well  as covering the canal incisions.  Drapes were removed.  The patient was cleaned off.  A  cotton ball coated in bacitracin ointment was added to the ear.    She was returned to Anesthesia for wakeup and was extubated and taken to the recovery room in stable condition.  AN/NUANCE  D:11/03/2018 T:11/03/2018 JOB:[id number]

## 2018-11-03 NOTE — H&P (Signed)
Morgan [name] is an 67 y.o. female.   Chief Complaint: Right TM perforation HPI: 67 year old female who underwent tympanoplasty of the right ear in her youth.  A recurrent perforation was noted in 2017.  She has draining from the ear now and then.  Hearing does not appear to be affected significantly.  She presents for surgical management.  Past Medical History:  Diagnosis Date  . Abnormal chest x-ray   . Abnormal electrocardiogram   . Allergic rhinitis   . Cancer ([place])    Skin non melanoma  . Colon polyp    polypoid colorectal mucosa/adenomatous  . Complication of anesthesia   . Contact dermatitis due to poison ivy   . Cystitis   . Diverticulosis of colon   . Dysrhythmia    skips at times  . Fatty liver disease, nonalcoholic   . GERD (gastroesophageal reflux disease)    occasional  . Hearing loss   . Hemorrhoids   . Hyperlipidemia   . Hypertension   . Lymphocytosis   . Overweight(278.02)   . Pneumonia    2017  . PONV (postoperative nausea and vomiting)   . RA (rheumatoid arthritis) (HCC)    Rheumatoid  . Venous insufficiency     Past Surgical History:  Procedure Laterality Date  . CESAREAN SECTION     [phone]  . chemical and laser endovenous ablation of LE veins  2008, 2009, 2010, 2011   Dr Eilleen Kempf et al  . FOOT SURGERY     left  . INNER EAR SURGERY Right    childhood; right, ear drum repair  . LAPAROSCOPIC LYSIS OF ADHESIONS N/A 12/11/2017   Procedure: LAPAROSCOPIC LYSIS OF ADHESIONS;  Surgeon: Michael Boston, MD;  Location: [facility];  Service: General;  Laterality: N/A;  . NASAL SEPTUM SURGERY    . PROCTOSCOPY N/A 12/11/2017   Procedure: RIDGED PROCTOSCOPY;  Surgeon: Michael Boston, MD;  Location: [facility];  Service: General;  Laterality: N/A;  . TOTAL ABDOMINAL HYSTERECTOMY  12/86    Family History  Problem Relation Age of Onset  . Hyperlipidemia Mother   . Hypertension Mother   . COPD Mother   . Arthritis Mother   . Heart disease Mother   . Atrial  fibrillation Mother   . Diabetes Sister   . Healthy Sister   . Heart disease Sister        sister #1  . Breast cancer Other        half-sister  . Liver disease Sister        sister #1 - hx of liver transplant 14+ yrs ago  . Diabetes Maternal Uncle   . Heart failure Brother   . Diabetes Maternal Aunt        x 2  . Heart failure Son   . Colon cancer Neg Hx    Social History:  reports that she quit smoking about 16 years ago. Her smoking use included cigarettes. She has a 7.50 pack-year smoking history. She has never used smokeless tobacco. She reports that she drinks alcohol. She reports that she does not use drugs.  Allergies:  Allergies  Allergen Reactions  . Remicade [Infliximab] Swelling and Other (See Comments)    Facial swelling, throat was closing up.     Medications Prior to Admission  Medication Sig Dispense Refill  . acetaminophen (TYLENOL) 500 MG tablet Take 500 mg by mouth every 6 (six) hours as needed for moderate pain or headache.     Marland Kitchen aspirin 81  MG tablet Take 81 mg by mouth 3 (three) times a week.     . calcium-vitamin D (OSCAL WITH D) 500-200 MG-UNIT tablet Take 1 tablet by mouth 2 (two) times daily.    . clobetasol cream (TEMOVATE) 0.05 % APPLY TO AFFECTED AREA TWICE A DAY AS NEEDED  3  . fenofibrate 160 MG tablet TAKE ONE TABLET (160 MG TOTAL) BY MOUTH AT BEDTIME. 90 tablet 3  . fexofenadine (ALLEGRA) 180 MG tablet Take 180 mg by mouth daily.    . Golimumab (SIMPONI [facility]) Inject into the skin.     . hydrochlorothiazide (HYDRODIURIL) 25 MG tablet TAKE 1 TABLET BY MOUTH EVERY DAY 90 tablet 3  . hydroxychloroquine (PLAQUENIL) 200 MG tablet Take 400 mg by mouth daily.    . Hypromellose (ARTIFICIAL TEARS OP) Place 1 drop into both eyes daily as needed (for dry eyes).    . Multiple Vitamin (MULTIVITAMIN) tablet Take 1 tablet by mouth daily.      . predniSONE (DELTASONE) 5 MG tablet Take 5 mg by mouth daily.  3  . Probiotic Product (PROBIOTIC PO) Take by mouth daily.     . simvastatin (ZOCOR) 40 MG tablet TAKE ONE TABLET (40 MG TOTAL) BY MOUTH AT BEDTIME. 90 tablet 3  . dicyclomine (BENTYL) 20 MG tablet Take 1 tablet (20 mg total) by mouth 2 (two) times daily. 20 tablet 0  . hyoscyamine (LEVSIN SL) 0.125 MG SL tablet TAKE 1 TABLET BY MOUTH EVERY 6 HOURS AS NEEDED. 100 tablet 3  . ondansetron (ZOFRAN) 4 MG tablet Take 1 tablet (4 mg total) every 6 (six) hours as needed by mouth for nausea. 20 tablet 0  . traMADol (ULTRAM) 50 MG tablet Take 1-2 tablets (50-100 mg total) by mouth every 6 (six) hours as needed for moderate pain or severe pain. 30 tablet 0    No results found for this or any previous visit (from the past 48 hour(s)). No results found.  Review of Systems  All other systems reviewed and are negative.   Height 5\' 4"  (1.626 m), weight 88.5 kg. Physical Exam  Constitutional: She is oriented to person, place, and time. She appears well-developed and well-nourished. No distress.  HENT:  Head: Normocephalic and atraumatic.  Right Ear: External ear normal.  Left Ear: External ear normal.  Nose: Nose normal.  Mouth/Throat: Oropharynx is clear and moist.  Right TM with 20% central dry perforation and with moderate sclerosis.  Eyes: Pupils are equal, round, and reactive to light. Conjunctivae and EOM are normal.  Neck: Normal range of motion. Neck supple.  Cardiovascular: Normal rate.  Respiratory: Effort normal.  Musculoskeletal: Normal range of motion.  Neurological: She is alert and oriented to person, place, and time. No cranial nerve deficit.  Skin: Skin is warm and dry.  Psychiatric: She has a normal mood and affect. Her behavior is normal. Judgment and thought content normal.     Assessment/Plan Right tympanic membrane perforation  To OR for right tympanoplasty.  Melida Quitter, MD 11/03/2018, 9:16 AM

## 2018-11-03 NOTE — Anesthesia Postprocedure Evaluation (Signed)
Anesthesia Post Note  Patient: Morgan [name]  Procedure(s) Performed: TYMPANOPLASTY (Right Ear)     Patient location during evaluation: PACU Anesthesia Type: General Level of consciousness: awake and alert Pain management: pain level controlled Vital Signs Assessment: post-procedure vital signs reviewed and stable Respiratory status: spontaneous breathing, nonlabored ventilation and respiratory function stable Cardiovascular status: blood pressure returned to baseline and stable Postop Assessment: no apparent nausea or vomiting Anesthetic complications: no    Last Vitals:  Vitals:   11/03/18 1145 11/03/18 1255  BP: (!) 116/59 (!) 137/59  Pulse: 73 68  Resp: 12 18  Temp:  36.8 C  SpO2: 93% 95%    Last Pain:  Vitals:   11/03/18 1255  TempSrc:   PainSc: 2                  Lynda Rainwater

## 2018-11-03 NOTE — Transfer of Care (Signed)
Immediate Anesthesia Transfer of Care Note  Patient: Morgan [name]  Procedure(s) Performed: TYMPANOPLASTY (Right Ear)  Patient Location: PACU  Anesthesia Type:General  Level of Consciousness: awake, alert  and oriented  Airway & Oxygen Therapy: Patient Spontanous Breathing and Patient connected to face mask oxygen  Post-op Assessment: Report given to RN and Post -op Vital signs reviewed and stable  Post vital signs: Reviewed and stable  Last Vitals:  Vitals Value Taken Time  BP 117/56 11/03/2018 11:31 AM  Temp    Pulse 73 11/03/2018 11:33 AM  Resp 18 11/03/2018 11:33 AM  SpO2 98 % 11/03/2018 11:33 AM  Vitals shown include unvalidated device data.  Last Pain:  Vitals:   11/03/18 0926  TempSrc: Oral  PainSc: 0-No pain      Patients Stated Pain Goal: 3 (33/35/45 6256)  Complications: No apparent anesthesia complications

## 2018-11-04 ENCOUNTER — Encounter (HOSPITAL_BASED_OUTPATIENT_CLINIC_OR_DEPARTMENT_OTHER): Payer: Self-pay | Admitting: Otolaryngology

## 2018-11-13 DIAGNOSIS — M2550 Pain in unspecified joint: Secondary | ICD-10-CM | POA: Diagnosis not present

## 2018-11-13 DIAGNOSIS — Z79899 Other long term (current) drug therapy: Secondary | ICD-10-CM | POA: Diagnosis not present

## 2018-11-13 DIAGNOSIS — M0609 Rheumatoid arthritis without rheumatoid factor, multiple sites: Secondary | ICD-10-CM | POA: Diagnosis not present

## 2018-11-18 ENCOUNTER — Other Ambulatory Visit: Payer: Self-pay | Admitting: Family Medicine

## 2018-11-20 ENCOUNTER — Ambulatory Visit (INDEPENDENT_AMBULATORY_CARE_PROVIDER_SITE_OTHER): Payer: BLUE CROSS/BLUE SHIELD | Admitting: Physician Assistant

## 2018-11-20 ENCOUNTER — Other Ambulatory Visit: Payer: Self-pay

## 2018-11-20 ENCOUNTER — Encounter: Payer: Self-pay | Admitting: Physician Assistant

## 2018-11-20 VITALS — BP 132/78 | HR 78 | Temp 98.2°F | Resp 16 | Ht 64.0 in | Wt 196.0 lb

## 2018-11-20 DIAGNOSIS — J019 Acute sinusitis, unspecified: Secondary | ICD-10-CM | POA: Diagnosis not present

## 2018-11-20 DIAGNOSIS — B9689 Other specified bacterial agents as the cause of diseases classified elsewhere: Secondary | ICD-10-CM | POA: Diagnosis not present

## 2018-11-20 MED ORDER — BENZONATATE 100 MG PO CAPS: 100.0000 mg | ORAL_CAPSULE | Freq: Three times a day (TID) | ORAL | 0 refills | Status: DC | PRN

## 2018-11-20 MED ORDER — AMOXICILLIN-POT CLAVULANATE 875-125 MG PO TABS: 1.0000 | ORAL_TABLET | Freq: Two times a day (BID) | ORAL | 0 refills | Status: DC

## 2018-11-20 MED ORDER — FLUTICASONE PROPIONATE 50 MCG/ACT NA SUSP: 2.0000 | Freq: Every day | NASAL | 0 refills | Status: DC

## 2018-11-20 NOTE — Progress Notes (Signed)
Patient presents to clinic today c/o > 1 week of post-nasal drip, dry cough, sinus pressure and now having sinus pain with maxillary facial pain with R>L. Recently had TM repair of R ear. Denies fever, chills. Denies recent travel. Has taken advil for symptoms. Is planning on leaving on a cruise next Monday.   Past Medical History:  Diagnosis Date  . Abnormal chest x-ray   . Abnormal electrocardiogram   . Allergic rhinitis   . Cancer ([place])    Skin non melanoma  . Colon polyp    polypoid colorectal mucosa/adenomatous  . Complication of anesthesia   . Contact dermatitis due to poison ivy   . Cystitis   . Diverticulosis of colon   . Dysrhythmia    skips at times  . Fatty liver disease, nonalcoholic   . GERD (gastroesophageal reflux disease)    occasional  . Hearing loss   . Hemorrhoids   . Hyperlipidemia   . Hypertension   . Lymphocytosis   . Overweight(278.02)   . Pneumonia    2017  . PONV (postoperative nausea and vomiting)   . RA (rheumatoid arthritis) (HCC)    Rheumatoid  . Venous insufficiency     Current Outpatient Medications on File Prior to Visit  Medication Sig Dispense Refill  . acetaminophen (TYLENOL) 500 MG tablet Take 500 mg by mouth every 6 (six) hours as needed for moderate pain or headache.     Marland Kitchen aspirin 81 MG tablet Take 81 mg by mouth 3 (three) times a week.     . calcium-vitamin D (OSCAL WITH D) 500-200 MG-UNIT tablet Take 1 tablet by mouth 2 (two) times daily.    . clobetasol cream (TEMOVATE) 0.05 % APPLY TO AFFECTED AREA TWICE A DAY AS NEEDED  3  . dicyclomine (BENTYL) 20 MG tablet Take 1 tablet (20 mg total) by mouth 2 (two) times daily. 20 tablet 0  . fenofibrate 160 MG tablet TAKE ONE TABLET (160 MG TOTAL) BY MOUTH AT BEDTIME. 90 tablet 3  . fexofenadine (ALLEGRA) 180 MG tablet Take 180 mg by mouth daily.    . Golimumab (SIMPONI [place]) Inject into the skin.     . hydrochlorothiazide (HYDRODIURIL) 25 MG tablet TAKE 1 TABLET BY MOUTH EVERY DAY 90  tablet 3  . HYDROcodone-acetaminophen (NORCO/VICODIN) 5-325 MG tablet Take 1-2 tablets by mouth every 6 (six) hours as needed for moderate pain. 20 tablet 0  . hydroxychloroquine (PLAQUENIL) 200 MG tablet Take 200 mg by mouth daily.     . hyoscyamine (LEVSIN SL) 0.125 MG SL tablet TAKE 1 TABLET BY MOUTH EVERY 6 HOURS AS NEEDED. 100 tablet 3  . Hypromellose (ARTIFICIAL TEARS OP) Place 1 drop into both eyes daily as needed (for dry eyes).    . Multiple Vitamin (MULTIVITAMIN) tablet Take 1 tablet by mouth daily.      Marland Kitchen ofloxacin (FLOXIN) 0.3 % OTIC solution Place 5 drops into the right ear daily.    . ondansetron (ZOFRAN) 4 MG tablet Take 1 tablet (4 mg total) every 6 (six) hours as needed by mouth for nausea. 20 tablet 0  . predniSONE (DELTASONE) 5 MG tablet Take 5 mg by mouth daily.  3  . Probiotic Product (PROBIOTIC PO) Take by mouth daily.    . simvastatin (ZOCOR) 40 MG tablet TAKE ONE TABLET (40 MG TOTAL) BY MOUTH AT BEDTIME. 90 tablet 3  . traMADol (ULTRAM) 50 MG tablet Take 1-2 tablets (50-100 mg total) by mouth every 6 (six) hours as  needed for moderate pain or severe pain. 30 tablet 0   No current facility-administered medications on file prior to visit.     Allergies  Allergen Reactions  . Remicade [Infliximab] Swelling and Other (See Comments)    Facial swelling, throat was closing up.     Family History  Problem Relation Age of Onset  . Hyperlipidemia Mother   . Hypertension Mother   . COPD Mother   . Arthritis Mother   . Heart disease Mother   . Atrial fibrillation Mother   . Diabetes Sister   . Healthy Sister   . Heart disease Sister        sister #1  . Breast cancer Other        half-sister  . Liver disease Sister        sister #1 - hx of liver transplant 14+ yrs ago  . Diabetes Maternal Uncle   . Heart failure Brother   . Diabetes Maternal Aunt        x 2  . Heart failure Son   . Colon cancer Neg Hx     Social History   Socioeconomic History  . Marital  status: Married    Spouse name: [name]  . Number of children: 4  . Years of education: Not on file  . Highest education level: Not on file  Occupational History  . Occupation: Community education officer: [employer]  . Financial resource strain: Not on file  . Food insecurity:    Worry: Not on file    Inability: Not on file  . Transportation needs:    Medical: Not on file    Non-medical: Not on file  Tobacco Use  . Smoking status: Former Smoker    Packs/day: 0.50    Years: 15.00    Pack years: 7.50    Types: Cigarettes    Last attempt to quit: 12/03/2001    Years since quitting: 16.9  . Smokeless tobacco: Never Used  Substance and Sexual Activity  . Alcohol use: Yes    Comment: occasional  . Drug use: No  . Sexual activity: Yes  Lifestyle  . Physical activity:    Days per week: Not on file    Minutes per session: Not on file  . Stress: Not on file  Relationships  . Social connections:    Talks on phone: Not on file    Gets together: Not on file    Attends religious service: Not on file    Active member of club or organization: Not on file    Attends meetings of clubs or organizations: Not on file    Relationship status: Not on file  Other Topics Concern  . Not on file  Social History Narrative   Married to [place], with children and grandchildren. 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  All other ROS are negative.  BP 132/78   Pulse 78   Temp 98.2 F (36.8 C) (Oral)   Resp 16   Ht 5\' 4"  (1.626 m)   Wt 196 lb (88.9 kg)   SpO2 99%   BMI 33.64 kg/m   Physical Exam Vitals signs reviewed.  Constitutional:      Appearance: Normal appearance.  HENT:     Head: Normocephalic and atraumatic.     Right Ear: Tympanic membrane normal.     Left Ear: Tympanic membrane normal.  Nose: Congestion and rhinorrhea present.     Right Sinus: Maxillary sinus tenderness present.     Left Sinus:  Maxillary sinus tenderness present.     Mouth/Throat:     Pharynx: Oropharynx is clear.  Eyes:     Conjunctiva/sclera: Conjunctivae normal.  Neck:     Musculoskeletal: Neck supple.  Cardiovascular:     Rate and Rhythm: Normal rate and regular rhythm.  Pulmonary:     Effort: Pulmonary effort is normal.     Breath sounds: Normal breath sounds.  Neurological:     Mental Status: She is alert.     Recent Results (from the past 2160 hour(s))  Basic metabolic panel     Status: Abnormal   Collection Time: 10/29/18  9:30 AM  Result Value Ref Range   Sodium 139 135 - 145 mmol/L   Potassium 4.2 3.5 - 5.1 mmol/L   Chloride 102 98 - 111 mmol/L   CO2 27 22 - 32 mmol/L   Glucose, Bld 81 70 - 99 mg/dL   BUN 18 8 - 23 mg/dL   Creatinine, Ser 1.07 (H) 0.44 - 1.00 mg/dL   Calcium 10.1 8.9 - 10.3 mg/dL   GFR calc non Af Amer 53 (L) >60 mL/min   GFR calc Af Amer >60 >60 mL/min   Anion gap 10 5 - 15    Comment: Performed at [facility] [address], [place], [place] [address]    Assessment/Plan: 1. Acute bacterial sinusitis Rx Augmentin.  Increase fluids.  Rest.  Saline nasal spray.  Probiotic.  Mucinex as directed.  Humidifier in bedroom. Tessalon and Flonase per orders.  Call or return to clinic if symptoms are not improving.  - amoxicillin-clavulanate (AUGMENTIN) 875-125 MG tablet; Take 1 tablet by mouth 2 (two) times daily.  Dispense: 14 tablet; Refill: 0 - fluticasone (FLONASE) 50 MCG/ACT nasal spray; Place 2 sprays into both nostrils daily.  Dispense: 16 g; Refill: 0 - benzonatate (TESSALON) 100 MG capsule; Take 1 capsule (100 mg total) by mouth 3 (three) times daily as needed for cough.  Dispense: 30 capsule; Refill: 0   Leeanne Rio, PA-C

## 2018-11-20 NOTE — Patient Instructions (Signed)
Please take antibiotic as directed.  Increase fluid intake.  Use Saline nasal spray.  Take a daily multivitamin. Start the [facility] daily. The Tessalon should help with cough.  Place a humidifier in the bedroom.  Please call or return clinic if symptoms are not improving.  Sinusitis Sinusitis is redness, soreness, and swelling (inflammation) of the paranasal sinuses. Paranasal sinuses are air pockets within the bones of your face (beneath the eyes, the middle of the forehead, or above the eyes). In healthy paranasal sinuses, mucus is able to drain out, and air is able to circulate through them by way of your nose. However, when your paranasal sinuses are inflamed, mucus and air can become trapped. This can allow bacteria and other germs to grow and cause infection. Sinusitis can develop quickly and last only a short time (acute) or continue over a long period (chronic). Sinusitis that lasts for more than 12 weeks is considered chronic.  CAUSES  Causes of sinusitis include:  Allergies.  Structural abnormalities, such as displacement of the cartilage that separates your nostrils (deviated septum), which can decrease the air flow through your nose and sinuses and affect sinus drainage.  Functional abnormalities, such as when the small hairs (cilia) that line your sinuses and help remove mucus do not work properly or are not present. SYMPTOMS  Symptoms of acute and chronic sinusitis are the same. The primary symptoms are pain and pressure around the affected sinuses. Other symptoms include:  Upper toothache.  Earache.  Headache.  Bad breath.  Decreased sense of smell and taste.  A cough, which worsens when you are lying flat.  Fatigue.  Fever.  Thick drainage from your nose, which often is green and may contain pus (purulent).  Swelling and warmth over the affected sinuses. DIAGNOSIS  Your caregiver will perform a physical exam. During the exam, your caregiver may:  Look in your nose  for signs of abnormal growths in your nostrils (nasal polyps).  Tap over the affected sinus to check for signs of infection.  View the inside of your sinuses (endoscopy) with a special imaging device with a light attached (endoscope), which is inserted into your sinuses. If your caregiver suspects that you have chronic sinusitis, one or more of the following tests may be recommended:  Allergy tests.  Nasal culture A sample of mucus is taken from your nose and sent to a lab and screened for bacteria.  Nasal cytology A sample of mucus is taken from your nose and examined by your caregiver to determine if your sinusitis is related to an allergy. TREATMENT  Most cases of acute sinusitis are related to a viral infection and will resolve on their own within 10 days. Sometimes medicines are prescribed to help relieve symptoms (pain medicine, decongestants, nasal steroid sprays, or saline sprays).  However, for sinusitis related to a bacterial infection, your caregiver will prescribe antibiotic medicines. These are medicines that will help kill the bacteria causing the infection.  Rarely, sinusitis is caused by a fungal infection. In theses cases, your caregiver will prescribe antifungal medicine. For some cases of chronic sinusitis, surgery is needed. Generally, these are cases in which sinusitis recurs more than 3 times per year, despite other treatments. HOME CARE INSTRUCTIONS   Drink plenty of water. Water helps thin the mucus so your sinuses can drain more easily.  Use a humidifier.  Inhale steam 3 to 4 times a day (for example, sit in the bathroom with the shower running).  Apply a warm, moist  washcloth to your face 3 to 4 times a day, or as directed by your caregiver.  Use saline nasal sprays to help moisten and clean your sinuses.  Take over-the-counter or prescription medicines for pain, discomfort, or fever only as directed by your caregiver. SEEK IMMEDIATE MEDICAL CARE IF:  You  have increasing pain or severe headaches.  You have nausea, vomiting, or drowsiness.  You have swelling around your face.  You have vision problems.  You have a stiff neck.  You have difficulty breathing. MAKE SURE YOU:   Understand these instructions.  Will watch your condition.  Will get help right away if you are not doing well or get worse. Document Released: 11/19/2005 Document Revised: 02/11/2012 Document Reviewed: 12/04/2011 [facility] Patient Information 2014 [place], [place].

## 2018-12-05 ENCOUNTER — Other Ambulatory Visit: Payer: Self-pay | Admitting: Family Medicine

## 2018-12-08 ENCOUNTER — Telehealth: Payer: Self-pay

## 2018-12-08 ENCOUNTER — Encounter: Payer: Self-pay | Admitting: Family Medicine

## 2018-12-08 ENCOUNTER — Other Ambulatory Visit: Payer: Self-pay

## 2018-12-08 ENCOUNTER — Telehealth: Payer: Self-pay | Admitting: Family Medicine

## 2018-12-08 ENCOUNTER — Ambulatory Visit: Payer: BLUE CROSS/BLUE SHIELD | Admitting: Family Medicine

## 2018-12-08 VITALS — BP 138/76 | HR 70 | Temp 99.0°F | Resp 18 | Ht 64.0 in | Wt 200.2 lb

## 2018-12-08 DIAGNOSIS — I10 Essential (primary) hypertension: Secondary | ICD-10-CM | POA: Diagnosis not present

## 2018-12-08 DIAGNOSIS — K760 Fatty (change of) liver, not elsewhere classified: Secondary | ICD-10-CM

## 2018-12-08 DIAGNOSIS — M0579 Rheumatoid arthritis with rheumatoid factor of multiple sites without organ or systems involvement: Secondary | ICD-10-CM | POA: Diagnosis not present

## 2018-12-08 DIAGNOSIS — E669 Obesity, unspecified: Secondary | ICD-10-CM | POA: Diagnosis not present

## 2018-12-08 DIAGNOSIS — E782 Mixed hyperlipidemia: Secondary | ICD-10-CM | POA: Diagnosis not present

## 2018-12-08 DIAGNOSIS — Z0000 Encounter for general adult medical examination without abnormal findings: Secondary | ICD-10-CM

## 2018-12-08 DIAGNOSIS — D849 Immunodeficiency, unspecified: Secondary | ICD-10-CM

## 2018-12-08 DIAGNOSIS — D696 Thrombocytopenia, unspecified: Secondary | ICD-10-CM | POA: Diagnosis not present

## 2018-12-08 DIAGNOSIS — D899 Disorder involving the immune mechanism, unspecified: Secondary | ICD-10-CM | POA: Diagnosis not present

## 2018-12-08 LAB — TSH: TSH: 2.55 u[IU]/mL (ref 0.35–4.50)

## 2018-12-08 LAB — COMPREHENSIVE METABOLIC PANEL
ALT: 18 U/L (ref 0–35)
AST: 27 U/L (ref 0–37)
Albumin: 4.5 g/dL (ref 3.5–5.2)
Alkaline Phosphatase: 45 U/L (ref 39–117)
BUN: 20 mg/dL (ref 6–23)
CO2: 31 mEq/L (ref 19–32)
Calcium: 10.7 mg/dL — ABNORMAL HIGH (ref 8.4–10.5)
Chloride: 99 mEq/L (ref 96–112)
Creatinine, Ser: 0.88 mg/dL (ref 0.40–1.20)
GFR: 67.59 mL/min (ref >60.00)
Glucose, Bld: 96 mg/dL (ref 70–99)
Potassium: 3.8 mEq/L (ref 3.5–5.1)
Sodium: 140 mEq/L (ref 135–145)
Total Bilirubin: 0.5 mg/dL (ref 0.2–1.2)
Total Protein: 7.1 g/dL (ref 6.0–8.3)

## 2018-12-08 LAB — CBC WITH DIFFERENTIAL/PLATELET
Basophils Absolute: 0.1 10*3/uL (ref 0.0–0.1)
Basophils Relative: 0.3 % (ref 0.0–3.0)
Eosinophils Absolute: 0.1 10*3/uL (ref 0.0–0.7)
Eosinophils Relative: 0.5 % (ref 0.0–5.0)
HCT: 41.3 % (ref 36.0–46.0)
Hemoglobin: 13.7 g/dL (ref 12.0–15.0)
Lymphocytes Relative: 14.1 % (ref 12.0–46.0)
Lymphs Abs: 3.1 10*3/uL (ref 0.7–4.0)
MCHC: 33.1 g/dL (ref 30.0–36.0)
MCV: 93.6 fl (ref 78.0–100.0)
Monocytes Absolute: 4.9 10*3/uL — ABNORMAL HIGH (ref 0.1–1.0)
Monocytes Relative: 22.2 % — ABNORMAL HIGH (ref 3.0–12.0)
Neutro Abs: 13.8 10*3/uL — ABNORMAL HIGH (ref 1.4–7.7)
Neutrophils Relative %: 62.9 % (ref 43.0–77.0)
Platelets: 140 10*3/uL — ABNORMAL LOW (ref 150.0–400.0)
RBC: 4.42 Mil/uL (ref 3.87–5.11)
RDW: 15.5 % (ref 11.5–15.5)

## 2018-12-08 LAB — LIPID PANEL
CHOLESTEROL: 157 mg/dL (ref 0–200)
HDL: 66.8 mg/dL (ref >39.00)
LDL Cholesterol: 72 mg/dL (ref 0–99)
NonHDL: 90.41
Total CHOL/HDL Ratio: 2
Triglycerides: 90 mg/dL (ref 0.0–149.0)
VLDL: 18 mg/dL (ref 0.0–40.0)

## 2018-12-08 MED ORDER — AZITHROMYCIN 250 MG PO TABS: ORAL_TABLET | ORAL | 0 refills | Status: DC

## 2018-12-08 MED ORDER — DOXYCYCLINE HYCLATE 100 MG PO TABS: 100.0000 mg | ORAL_TABLET | Freq: Two times a day (BID) | ORAL | 0 refills | Status: AC

## 2018-12-08 MED ORDER — ONDANSETRON HCL 4 MG PO TABS: 4.0000 mg | ORAL_TABLET | Freq: Four times a day (QID) | ORAL | 0 refills | Status: DC | PRN

## 2018-12-08 NOTE — Telephone Encounter (Signed)
Please advise 

## 2018-12-08 NOTE — Telephone Encounter (Signed)
Critical: White Cell Count 21.9

## 2018-12-08 NOTE — Telephone Encounter (Signed)
Not a [facility] pt. Routing to [employer].

## 2018-12-08 NOTE — Patient Instructions (Addendum)
Please return in 6 months for follow up of your hypertension.  I've sent in an antibiotic for your bronchitis.   Please schedule your colonoscopy and your mammogram is due in March.  I will release your lab results to you on your MyChart account with further instructions. Please reply with any questions.    If you have any questions or concerns, please don't hesitate to send me a message via MyChart or call the office at [phone]. Thank you for visiting with [place] today! It's our pleasure caring for you.

## 2018-12-08 NOTE — Telephone Encounter (Signed)
Changed to doxy

## 2018-12-08 NOTE — Telephone Encounter (Signed)
Copied from [place] [id number]. Topic: Quick Communication - See Telephone Encounter >> Dec 08, 2018  9:01 AM Blase Mess A wrote: CRM for notification. See Telephone encounter for: 12/08/18.  Ebony Hail from [facility] on [address] is calling regarding  the prescription for the z-pak received today. The Z-pak can interact with hydroxychloroquine (PLAQUENIL) 200 MG tablet [[id number]] . Please call with a verbal ok or send in a new script. Please advise [phone]

## 2018-12-08 NOTE — Progress Notes (Signed)
Subjective  Chief Complaint  Patient presents with  . Annual Exam  . Hypertension  . Hyperlipidemia    HPI: Morgan [name] is a 68 y.o. female who presents to [place] at [facility] today for a Female Wellness Visit. She also has the concerns and/or needs as listed above in the chief complaint. These will be addressed in addition to the Health Maintenance Visit.   Wellness Visit: annual visit with health maintenance review and exam without Pap   Health maintenance: Due for repeat colonoscopy.  Patient will schedule with [name].  Last colonoscopy was equivocal due to problems with diverticular abscess.  Due for mammogram in March.  Bone density screening is up-to-date.  Feeling well: Energy level is good.  Weight is stable.  Mood is happy.  She is not a candidate for the Shingrix vaccination due to her immunosuppression.  Other immunizations are up-to-date Chronic disease f/u and/or acute problem visit: (deemed necessary to be done in addition to the wellness visit):  Hypertension and hyperlipidemia have been well controlled.  Here for recheck.  Fasting for lab work today.  Tolerating all medications without adverse side effects.  No symptoms of angina or heart failure.  No lower extremity edema.  Mild hypercalcemia at last visit on hydrochlorothiazide.  No symptoms of hypercalcemia.  Recheck today.  History of fatty liver, no right upper quadrant pain  Rheumatoid arthritis on immunosuppressants including prednisone and Plaquenil.  Mild thrombocytopenia due to medications.  Has follow-up with hematology.  Symptoms are fairly well controlled.  No red hot swollen painful joints at this time.  Pain does limit activity levels.  24 to 48-hour history of productive cough with postnasal drainage, chest congestion without myalgias or sore throat.  No shortness of breath  Assessment  1. Annual physical exam   2. Rheumatoid arthritis involving multiple sites with  positive rheumatoid factor (HCC)   3. Immunosuppressed status ([place])   4. Obesity with body mass index 30 or greater   5. Fatty liver disease, nonalcoholic   6. Essential hypertension   7. Mixed hyperlipidemia   8. Hypercalcemia   9. Thrombocytopenia ([place]) Chronic     Plan  Female Wellness Visit:  Age appropriate Health Maintenance and Prevention measures were discussed with patient. Included topics are cancer screening recommendations, ways to keep healthy (see AVS) including dietary and exercise recommendations, regular eye and dental care, use of seat belts, and avoidance of moderate alcohol use and tobacco use.  Patient to schedule colonoscopy and mammogram.  BMI: discussed patient's BMI and encouraged positive lifestyle modifications to help get to or maintain a target BMI.  HM needs and immunizations were addressed and ordered. See below for orders. See HM and immunization section for updates.  Routine labs and screening tests ordered including cmp, cbc and lipids where appropriate.  Discussed recommendations regarding Vit D and calcium supplementation (see AVS)  Chronic disease management visit and/or acute problem visit:  Hypertension well-controlled continue current medications.  If hypercalcemia is worse, will stop HCTZ.  Hyperlipidemia: Recheck fasting lipids today on statin.  Recheck LFTs with history of fatty liver.  Rheumatoid arthritis: Stable.  Monitoring blood counts.  Bronchitis, cover with antibiotics due to immunosuppression. Follow up: Return in about 6 months (around 06/08/2019) for follow up Hypertension.  Orders Placed This Encounter  Procedures  . CBC with Differential/Platelet  . Comprehensive metabolic panel  . Lipid panel  . TSH  . PTH, intact (no Ca)   Meds ordered this encounter  Medications  . ondansetron (ZOFRAN) 4 MG tablet    Sig: Take 1 tablet (4 mg total) by mouth every 6 (six) hours as needed for nausea.    Dispense:  20 tablet     Refill:  0  . azithromycin (ZITHROMAX) 250 MG tablet    Sig: Take 2 tabs today, then 1 tab daily for 4 days    Dispense:  1 each    Refill:  0      Lifestyle: Body mass index is 34.36 kg/m. Wt Readings from Last 3 Encounters:  12/08/18 200 lb 3.2 oz (90.8 kg)  11/20/18 196 lb (88.9 kg)  11/03/18 198 lb 13.7 oz (90.2 kg)    Patient Active Problem List   Diagnosis Date Noted  . H/O partial resection of colon 01/31/2018  . Diverticulitis with abscess status post robotic low anterior rectosigmoid resection 12/11/2017 12/11/2017  . Rheumatoid arthritis involving multiple sites with positive rheumatoid factor ([place]) 10/31/2017  . Immunosuppressed status ([place]) 10/08/2017  . Obesity with body mass index 30 or greater 01/25/2017  . Current chronic use of systemic steroids 06/08/2016  . Uses hearing aid 12/09/2015  . Leukocytosis 08/27/2014  . Thrombocytopenia ([place]) 11/23/2013  . Fatty liver disease, nonalcoholic 31/51/7616  . HEARING LOSS 10/31/2008  . Benign colon polyp 10/20/2008  . Essential hypertension 10/20/2008  . Diverticulosis of colon 10/20/2008  . Mixed hyperlipidemia 04/23/2008  . VENOUS INSUFFICIENCY 04/23/2008  . Allergic rhinitis 04/23/2008  . Gastroesophageal reflux disease without esophagitis 04/23/2008   Morning Health Maintenance  Topic Date Due  . COLONOSCOPY  11/20/2018  . MAMMOGRAM  02/03/2019  . DEXA SCAN  02/11/2021  . TETANUS/TDAP  12/10/2027  . INFLUENZA VACCINE  Completed  . Hepatitis C Screening  Completed  . PNA vac Low Risk Adult  Completed   Immunization History  Administered Date(s) Administered  . Influenza Split 01/09/2012, 08/25/2012, 09/30/2013  . Influenza Whole 12/12/2009  . Influenza, High Dose Seasonal PF 09/03/2016, 09/19/2017, 09/11/2018  . Influenza-Unspecified 08/03/2014, 08/04/2015, 08/03/2016, 09/16/2017  . Pneumococcal Conjugate-13 08/27/2014  . Pneumococcal Polysaccharide-23 12/09/2015  . Tdap 12/09/2017   We updated and  reviewed the patient's past history in detail and it is documented below. Allergies: Patient is allergic to remicade [infliximab]. Past Medical History Patient  has a past medical history of Abnormal chest x-ray, Abnormal electrocardiogram, Abscess of sigmoid colon due to diverticulitis (11/04/2017), Allergic rhinitis, Cancer ([place]), Colon polyp, Complication of anesthesia, Contact dermatitis due to poison ivy, Cystitis, Diverticulosis of colon, Dysrhythmia, Fatty liver disease, nonalcoholic, GERD (gastroesophageal reflux disease), Hearing loss, Hemorrhoids, Hyperlipidemia, Hypertension, Lymphocytosis, Overweight(278.02), Pneumonia, PONV (postoperative nausea and vomiting), RA (rheumatoid arthritis) ([place]), and Venous insufficiency. Past Surgical History Patient  has a past surgical history that includes Total abdominal hysterectomy (12/86); Inner ear surgery (Right); Nasal septum surgery; Foot surgery; chemical and laser endovenous ablation of LE veins (2008, 2009, 2010, 2011); Cesarean section; Proctoscopy (N/A, 12/11/2017); Laparoscopic lysis of adhesions (N/A, 12/11/2017); and Tympanoplasty (Right, 11/03/2018). Family History: Patient family history includes Arthritis in her mother; Atrial fibrillation in her mother; Breast cancer in an other family member; COPD in her mother; Diabetes in her maternal aunt, maternal uncle, and sister; Healthy in her sister; Heart disease in her mother and sister; Heart failure in her brother and son; Hyperlipidemia in her mother; Hypertension in her mother; Liver disease in her sister. Social History:  Patient  reports that she quit smoking about 17 years ago. Her smoking use included cigarettes. She has a 7.50 pack-year smoking  history. She has never used smokeless tobacco. She reports current alcohol use. She reports that she does not use drugs.  Review of Systems: Constitutional: negative for fever or malaise Ophthalmic: negative for photophobia, double vision or loss  of vision Cardiovascular: negative for chest pain, dyspnea on exertion, or new LE swelling Respiratory: negative for SOB or persistent cough Gastrointestinal: negative for abdominal pain, change in bowel habits or melena Genitourinary: negative for dysuria or gross hematuria, no abnormal uterine bleeding or disharge Musculoskeletal: negative for new gait disturbance or muscular weakness Integumentary: negative for new or persistent rashes, no breast lumps Neurological: negative for TIA or stroke symptoms Psychiatric: negative for SI or delusions Allergic/Immunologic: negative for hives  Patient Care Team    Relationship Specialty Notifications Start End  [name], MD PCP - General Family Medicine  09/01/14   Michael Boston, MD Consulting Physician General Surgery  10/08/17   Irene Shipper, MD Consulting Physician Gastroenterology  10/10/17   Wyatt Portela, MD Consulting Physician Hematology and Oncology  10/10/17   Michel Bickers, MD Consulting Physician Infectious Diseases  11/04/17   Gavin Pound, MD Consulting Physician Rheumatology  11/04/17     Objective  Vitals: BP 138/76   Pulse 70   Temp 99 F (37.2 C) (Oral)   Resp 18   Ht 5\' 4"  (1.626 m)   Wt 200 lb 3.2 oz (90.8 kg)   SpO2 99%   BMI 34.36 kg/m  General:  Well developed, well nourished, no acute distress, congestion is present with coughing Psych:  Alert and orientedx3,normal mood and affect HEENT:  Normocephalic, atraumatic, non-icteric sclera, PERRL, oropharynx is clear without mass or exudate, supple neck without adenopathy, mass or thyromegaly Cardiovascular:  Normal S1, S2, RRR without gallop, rub or murmur, nondisplaced PMI Respiratory:  Good breath sounds bilaterally, CTAB with normal respiratory effort Gastrointestinal: normal bowel sounds, soft, non-tender, no noted masses. No HSM MSK: no deformities, contusions. Joints are without erythema or swelling. Spine and CVA region are nontender Skin:  Warm,  prednisone dermopathy present Neurologic:    Mental status is normal. CN 2-11 are normal. Gross motor and sensory exams are normal. Normal gait. No tremor Breast Exam: No mass, skin retraction or nipple discharge is appreciated in either breast. No axillary adenopathy. Fibrocystic changes are not noted     Commons side effects, risks, benefits, and alternatives for medications and treatment plan prescribed today were discussed, and the patient expressed understanding of the given instructions. Patient is instructed to call or message via MyChart if he/she has any questions or concerns regarding our treatment plan. No barriers to understanding were identified. We discussed Red Flag symptoms and signs in detail. Patient expressed understanding regarding what to do in case of urgent or emergency type symptoms.   Medication list was reconciled, printed and provided to the patient in AVS. Patient instructions and summary information was reviewed with the patient as documented in the AVS. This note was prepared with assistance of Dragon voice recognition software. Occasional wrong-word or sound-a-like substitutions may have occurred due to the inherent limitations of voice recognition software

## 2018-12-09 LAB — PARATHYROID HORMONE, INTACT (NO CA): PTH: 13 pg/mL — ABNORMAL LOW (ref 14–64)

## 2018-12-11 ENCOUNTER — Encounter: Payer: Self-pay | Admitting: Family Medicine

## 2018-12-12 ENCOUNTER — Other Ambulatory Visit: Payer: Self-pay | Admitting: *Deleted

## 2018-12-12 DIAGNOSIS — D72829 Elevated white blood cell count, unspecified: Secondary | ICD-10-CM

## 2018-12-17 ENCOUNTER — Other Ambulatory Visit: Payer: Self-pay | Admitting: Obstetrics and Gynecology

## 2018-12-17 ENCOUNTER — Encounter: Payer: Self-pay | Admitting: Internal Medicine

## 2018-12-17 DIAGNOSIS — Z1231 Encounter for screening mammogram for malignant neoplasm of breast: Secondary | ICD-10-CM

## 2018-12-18 DIAGNOSIS — Z79899 Other long term (current) drug therapy: Secondary | ICD-10-CM | POA: Diagnosis not present

## 2018-12-18 DIAGNOSIS — M0609 Rheumatoid arthritis without rheumatoid factor, multiple sites: Secondary | ICD-10-CM | POA: Diagnosis not present

## 2018-12-19 ENCOUNTER — Other Ambulatory Visit: Payer: Self-pay | Admitting: Emergency Medicine

## 2018-12-19 DIAGNOSIS — B9689 Other specified bacterial agents as the cause of diseases classified elsewhere: Secondary | ICD-10-CM

## 2018-12-19 DIAGNOSIS — J019 Acute sinusitis, unspecified: Principal | ICD-10-CM

## 2018-12-19 MED ORDER — FLUTICASONE PROPIONATE 50 MCG/ACT NA SUSP: 2.0000 | Freq: Every day | NASAL | 6 refills | Status: DC

## 2018-12-25 ENCOUNTER — Ambulatory Visit (AMBULATORY_SURGERY_CENTER): Payer: Self-pay | Admitting: *Deleted

## 2018-12-25 ENCOUNTER — Encounter: Payer: Self-pay | Admitting: Internal Medicine

## 2018-12-25 ENCOUNTER — Telehealth: Payer: Self-pay | Admitting: *Deleted

## 2018-12-25 VITALS — Ht 64.0 in | Wt 201.0 lb

## 2018-12-25 DIAGNOSIS — Z86010 Personal history of colonic polyps: Secondary | ICD-10-CM

## 2018-12-25 MED ORDER — NA SULFATE-K SULFATE-MG SULF 17.5-3.13-1.6 GM/177ML PO SOLN: 1.0000 | Freq: Once | ORAL | 0 refills | Status: AC

## 2018-12-25 NOTE — Telephone Encounter (Signed)
No problems.  Okay to proceed with colonoscopy.  Thank you for the notification

## 2018-12-25 NOTE — Progress Notes (Signed)
No egg or soy allergy known to patient  issues with past sedation with any surgeries  or procedures of PONV--  no intubation problems  No diet pills per patient No home 02 use per patient  No blood thinners per patient  Pt denies issues with constipation  No A fib or A flutter  EMMI video sent to pt's e mail - pt declined  Pt has had a low platelet count of 84 4 months ago- then 12-08-2018 up to 140- WBC 21.9  Then 1-16 platelets back to 89 per pt- pt has labs tomorrow 1-24  to repeat - TE to Dr Henrene Pastor to make him aware

## 2018-12-25 NOTE — Telephone Encounter (Signed)
Pt notified proceed as scheduled  Morgan [name]

## 2018-12-25 NOTE — Telephone Encounter (Signed)
Dr Henrene Pastor,  I saw Morgan [name] this morning in [place].  She told me she had a low platelet count of 84 4 months ago due to her RA- - then 12-08-2018  Platelets up to 140- WBC 21.9  Then 1-16 platelets back to 89 per pt- pt has repeat labs tomorrow 1-24.  She has a colon scheduled with you on 01-08-2019 Thursday for a hx of colon polyps-  I wanted you to be aware and ask if there was anything I needed to tell her prior to her colon .  Thanks so much for your time, Marijean Niemann

## 2018-12-26 ENCOUNTER — Encounter: Payer: Self-pay | Admitting: Family Medicine

## 2018-12-26 ENCOUNTER — Other Ambulatory Visit (INDEPENDENT_AMBULATORY_CARE_PROVIDER_SITE_OTHER): Payer: BLUE CROSS/BLUE SHIELD

## 2018-12-26 DIAGNOSIS — D72829 Elevated white blood cell count, unspecified: Secondary | ICD-10-CM

## 2018-12-26 LAB — CBC WITH DIFFERENTIAL/PLATELET
Basophils Absolute: 0 10*3/uL (ref 0.0–0.1)
Basophils Relative: 0.3 % (ref 0.0–3.0)
Eosinophils Absolute: 0.1 10*3/uL (ref 0.0–0.7)
Eosinophils Relative: 0.8 % (ref 0.0–5.0)
HCT: 40 % (ref 36.0–46.0)
Hemoglobin: 13.4 g/dL (ref 12.0–15.0)
Lymphocytes Relative: 26.4 % (ref 12.0–46.0)
Lymphs Abs: 2.9 10*3/uL (ref 0.7–4.0)
MCHC: 33.4 g/dL (ref 30.0–36.0)
MCV: 92.5 fl (ref 78.0–100.0)
Monocytes Absolute: 2.8 10*3/uL — ABNORMAL HIGH (ref 0.1–1.0)
Monocytes Relative: 25.1 % — ABNORMAL HIGH (ref 3.0–12.0)
NEUTROS PCT: 47.4 % (ref 43.0–77.0)
Neutro Abs: 5.2 10*3/uL (ref 1.4–7.7)
Platelets: 103 10*3/uL — ABNORMAL LOW (ref 150.0–400.0)
RBC: 4.33 Mil/uL (ref 3.87–5.11)
RDW: 15.6 % — ABNORMAL HIGH (ref 11.5–15.5)
WBC: 11.1 10*3/uL — ABNORMAL HIGH (ref 4.0–10.5)

## 2018-12-31 ENCOUNTER — Telehealth: Payer: Self-pay | Admitting: *Deleted

## 2018-12-31 DIAGNOSIS — L299 Pruritus, unspecified: Secondary | ICD-10-CM | POA: Diagnosis not present

## 2018-12-31 DIAGNOSIS — L910 Hypertrophic scar: Secondary | ICD-10-CM | POA: Diagnosis not present

## 2018-12-31 DIAGNOSIS — L853 Xerosis cutis: Secondary | ICD-10-CM | POA: Diagnosis not present

## 2018-12-31 DIAGNOSIS — L570 Actinic keratosis: Secondary | ICD-10-CM | POA: Diagnosis not present

## 2018-12-31 NOTE — Telephone Encounter (Signed)
Medical records faxed to [facility]; RI# [phone]

## 2019-01-05 ENCOUNTER — Encounter: Payer: Self-pay | Admitting: Oncology

## 2019-01-08 ENCOUNTER — Ambulatory Visit (AMBULATORY_SURGERY_CENTER): Payer: BLUE CROSS/BLUE SHIELD | Admitting: Internal Medicine

## 2019-01-08 ENCOUNTER — Encounter: Payer: Self-pay | Admitting: Internal Medicine

## 2019-01-08 VITALS — BP 145/59 | HR 68 | Temp 96.4°F | Resp 12 | Ht 64.0 in | Wt 201.0 lb

## 2019-01-08 DIAGNOSIS — Z86010 Personal history of colonic polyps: Secondary | ICD-10-CM | POA: Diagnosis not present

## 2019-01-08 MED ORDER — SODIUM CHLORIDE 0.9 % IV SOLN: 500.0000 mL | Freq: Once | INTRAVENOUS | Status: DC

## 2019-01-08 NOTE — Progress Notes (Signed)
PT taken to PACU. Monitors in place. VSS. Report given to RN. 

## 2019-01-08 NOTE — Progress Notes (Signed)
Pt's states no medical or surgical changes since previsit or office visit. 

## 2019-01-08 NOTE — Patient Instructions (Signed)
Please read handouts provided. Continue present medications.     YOU HAD AN ENDOSCOPIC PROCEDURE TODAY AT [facility]:   Refer to the procedure report that was given to you for any specific questions about what was found during the examination.  If the procedure report does not answer your questions, please call your gastroenterologist to clarify.  If you requested that your care partner not be given the details of your procedure findings, then the procedure report has been included in a sealed envelope for you to review at your convenience later.  YOU SHOULD EXPECT: Some feelings of bloating in the abdomen. Passage of more gas than usual.  Walking can help get rid of the air that was put into your GI tract during the procedure and reduce the bloating. If you had a lower endoscopy (such as a colonoscopy or flexible sigmoidoscopy) you may notice spotting of blood in your stool or on the toilet paper. If you underwent a bowel prep for your procedure, you may not have a normal bowel movement for a few days.  Please Note:  You might notice some irritation and congestion in your nose or some drainage.  This is from the oxygen used during your procedure.  There is no need for concern and it should clear up in a day or so.  SYMPTOMS TO REPORT IMMEDIATELY:   Following lower endoscopy (colonoscopy or flexible sigmoidoscopy):  Excessive amounts of blood in the stool  Significant tenderness or worsening of abdominal pains  Swelling of the abdomen that is new, acute  Fever of 100F or higher   For urgent or emergent issues, a gastroenterologist can be reached at any hour by calling [phone].   DIET:  We do recommend a small meal at first, but then you may proceed to your regular diet.  Drink plenty of fluids but you should avoid alcoholic beverages for 24 hours.  ACTIVITY:  You should plan to take it easy for the rest of today and you should NOT DRIVE or use heavy machinery  until tomorrow (because of the sedation medicines used during the test).    FOLLOW UP: Our staff will call the number listed on your records the next business day following your procedure to check on you and address any questions or concerns that you may have regarding the information given to you following your procedure. If we do not reach you, we will leave a message.  However, if you are feeling well and you are not experiencing any problems, there is no need to return our call.  We will assume that you have returned to your regular daily activities without incident.  If any biopsies were taken you will be contacted by phone or by letter within the next 1-3 weeks.  Please call us at [phone] if you have not heard about the biopsies in 3 weeks.    SIGNATURES/CONFIDENTIALITY: You and/or your care partner have signed paperwork which will be entered into your electronic medical record.  These signatures attest to the fact that that the information above on your After Visit Summary has been reviewed and is understood.  Full responsibility of the confidentiality of this discharge information lies with you and/or your care-partner.

## 2019-01-08 NOTE — Op Note (Signed)
[place] Patient Name: Morgan [name] Procedure Date: 01/08/2019 9:59 AM MRN: [id number] Endoscopist: Docia Chuck. Henrene Pastor , MD Age: 68 Referring MD:  Date of Birth: 26-Aug-1951 Gender: Female Account #: [id number] Procedure:                Colonoscopy Indications:              High risk colon cancer surveillance: Personal                            history of multiple (3 or more) adenomas. Previous                            examinations 2002, 2005, 2011, 2013, incomplete                            exam 2018 followed by rectosigmoid colonic                            resection for complicated diverticular disease                            January 2019. Medicines:                Monitored Anesthesia Care Procedure:                Pre-Anesthesia Assessment:                           - Prior to the procedure, a History and Physical                            was performed, and patient medications and                            allergies were reviewed. The patient's tolerance of                            previous anesthesia was also reviewed. The risks                            and benefits of the procedure and the sedation                            options and risks were discussed with the patient.                            All questions were answered, and informed consent                            was obtained. Prior Anticoagulants: The patient has                            taken no previous anticoagulant or antiplatelet  agents. ASA Grade Assessment: II - A patient with                            mild systemic disease. After reviewing the risks                            and benefits, the patient was deemed in                            satisfactory condition to undergo the procedure.                           After obtaining informed consent, the colonoscope                            was passed under direct vision. Throughout the             procedure, the patient's blood pressure, pulse, and                            oxygen saturations were monitored continuously. The                            Colonoscope was introduced through the anus and                            advanced to the the cecum, identified by                            appendiceal orifice and ileocecal valve. The                            ileocecal valve, appendiceal orifice, and rectum                            were photographed. The quality of the bowel                            preparation was excellent. The colonoscopy was                            performed without difficulty. The patient tolerated                            the procedure well. The bowel preparation used was                            SUPREP. Scope In: 10:07:48 AM Scope Out: 10:18:03 AM Scope Withdrawal Time: 0 hours 8 minutes 33 seconds  Total Procedure Duration: 0 hours 10 minutes 15 seconds  Findings:                 Scattered medium-mouthed diverticula were found in  the entire colon.                           The surgical anastomosis in the rectosigmoid region                            is healthy-appearing. No polyps identified. The                            exam was otherwise without abnormality on direct                            and retroflexion views. Complications:            No immediate complications. Estimated blood loss:                            None. Estimated Blood Loss:     Estimated blood loss: none. Impression:               - Diverticulosis in the entire examined colon.                           - The examination was otherwise normal on direct                            and retroflexion views.                           - No specimens collected. Recommendation:           - Repeat colonoscopy in 5 years for surveillance                            (history of multiple adenomas).                           - Patient has a  contact number available for                            emergencies. The signs and symptoms of potential                            delayed complications were discussed with the                            patient. Return to normal activities tomorrow.                            Written discharge instructions were provided to the                            patient.                           - Resume previous diet.                           -  Continue present medications. Docia Chuck. Henrene Pastor, MD 01/08/2019 10:26:35 AM This report has been signed electronically.

## 2019-01-09 ENCOUNTER — Telehealth: Payer: Self-pay

## 2019-01-09 NOTE — Telephone Encounter (Signed)
  Follow up Call-  Call back number 01/08/2019 11/20/2017  Post procedure Call Back phone  # [phone]  Permission to leave phone message Yes Yes  Some recent data might be hidden     Patient questions:  Do you have a fever, pain , or abdominal swelling? No. Pain Score  0 *  Have you tolerated food without any problems? Yes.    Have you been able to return to your normal activities? Yes.    Do you have any questions about your discharge instructions: Diet   No. Medications  No. Follow up visit  No.  Do you have questions or concerns about your Care? No.  Actions: * If pain score is 4 or above: No action needed, pain <4.

## 2019-02-02 DIAGNOSIS — H7201 Central perforation of tympanic membrane, right ear: Secondary | ICD-10-CM | POA: Diagnosis not present

## 2019-02-02 DIAGNOSIS — H903 Sensorineural hearing loss, bilateral: Secondary | ICD-10-CM | POA: Insufficient documentation

## 2019-02-05 ENCOUNTER — Ambulatory Visit
Admission: RE | Admit: 2019-02-05 | Discharge: 2019-02-05 | Disposition: A | Discharge status: Home / Self Care | Payer: BLUE CROSS/BLUE SHIELD | Source: Ambulatory Visit | Attending: Obstetrics and Gynecology | Admitting: Obstetrics and Gynecology

## 2019-02-05 DIAGNOSIS — Z1231 Encounter for screening mammogram for malignant neoplasm of breast: Secondary | ICD-10-CM

## 2019-02-09 ENCOUNTER — Encounter: Payer: Self-pay | Admitting: Oncology

## 2019-02-10 ENCOUNTER — Telehealth: Payer: Self-pay | Admitting: Oncology

## 2019-02-10 ENCOUNTER — Inpatient Hospital Stay: Payer: BLUE CROSS/BLUE SHIELD | Attending: Oncology

## 2019-02-10 ENCOUNTER — Inpatient Hospital Stay: Payer: BLUE CROSS/BLUE SHIELD | Admitting: Oncology

## 2019-02-10 VITALS — BP 161/69 | HR 73 | Temp 98.3°F | Resp 18 | Ht 64.0 in | Wt 202.9 lb

## 2019-02-10 DIAGNOSIS — D696 Thrombocytopenia, unspecified: Secondary | ICD-10-CM | POA: Insufficient documentation

## 2019-02-10 DIAGNOSIS — K5732 Diverticulitis of large intestine without perforation or abscess without bleeding: Secondary | ICD-10-CM

## 2019-02-10 DIAGNOSIS — M069 Rheumatoid arthritis, unspecified: Secondary | ICD-10-CM | POA: Diagnosis not present

## 2019-02-10 DIAGNOSIS — D72821 Monocytosis (symptomatic): Secondary | ICD-10-CM | POA: Insufficient documentation

## 2019-02-10 DIAGNOSIS — R040 Epistaxis: Secondary | ICD-10-CM | POA: Diagnosis not present

## 2019-02-10 LAB — CBC WITH DIFFERENTIAL (CANCER CENTER ONLY)
Abs Immature Granulocytes: 0.84 10*3/uL — ABNORMAL HIGH (ref 0.00–0.07)
Basophils Absolute: 0.1 10*3/uL (ref 0.0–0.1)
Basophils Relative: 1 %
Eosinophils Absolute: 0 10*3/uL (ref 0.0–0.5)
Eosinophils Relative: 0 %
HCT: 39.8 % (ref 36.0–46.0)
Hemoglobin: 13 g/dL (ref 12.0–15.0)
Immature Granulocytes: 6 %
Lymphocytes Relative: 17 %
Lymphs Abs: 2.3 10*3/uL (ref 0.7–4.0)
MCH: 31.1 pg (ref 26.0–34.0)
MCHC: 32.7 g/dL (ref 30.0–36.0)
MCV: 95.2 fL (ref 80.0–100.0)
MONOS PCT: 22 %
Monocytes Absolute: 2.9 10*3/uL — ABNORMAL HIGH (ref 0.1–1.0)
Neutro Abs: 7.2 10*3/uL (ref 1.7–7.7)
Neutrophils Relative %: 54 %
Platelet Count: 90 10*3/uL — ABNORMAL LOW (ref 150–400)
RBC: 4.18 MIL/uL (ref 3.87–5.11)
RDW: 15.5 % (ref 11.5–15.5)
WBC Count: 13.3 10*3/uL — ABNORMAL HIGH (ref 4.0–10.5)
nRBC: 0 % (ref 0.0–0.2)

## 2019-02-10 NOTE — Progress Notes (Signed)
Hematology and Oncology Follow Up Visit  KATHERYNE GORR [id number] 10-Sep-1951 68 y.o. 02/10/2019 8:35 AM [name], MDAndy, [name], MD   Principle Diagnosis: 68 year old woman with:  1.  Monocytosis diagnosed in 2015 appears to be reactive related due to chronic inflammatory conditions such as rheumatoid arthritis.  2.  Thrombocytopenia: Autoimmune in nature and does not require any treatment.     Current therapy: Active surveillance.  Interim History:  Ms. Renken returns today for a repeat evaluation.  Since the last visit, she reports no major changes in her health.  She continues to eat well and perform activities of daily living.  She has reported some increased joint stiffness associated with rheumatoid arthritis because of decreased doses of Plaquenil.  She has reported periodic nosebleeds that have been mild and easily controlled but they have increased in frequency.  She has not had to go to the emergency department or have it evaluated.  She denies any other bleeding including hematochezia, melena or hematemesis.     Patient denied any alteration mental status, neuropathy, confusion or dizziness.  Denies any headaches or lethargy.  Denies any night sweats, weight loss or changes in appetite.  Denied orthopnea, dyspnea on exertion or chest discomfort.  Denies shortness of breath, difficulty breathing hemoptysis or cough.  Denies any abdominal distention, nausea, early satiety or dyspepsia.  Denies any hematuria, frequency, dysuria or nocturia.  Denies any skin irritation, dryness or rash.  Denies any ecchymosis or petechiae.  Denies any lymphadenopathy or clotting.  Denies any heat or cold intolerance.  Denies any anxiety or depression.  Remaining review of system is negative.   Medications:  I personally reviewed her medication is unchanged today. Current Outpatient Medications  Medication Sig Dispense Refill  . acetaminophen (TYLENOL) 500 MG tablet Take 500 mg by mouth every  6 (six) hours as needed for moderate pain or headache.     . Ascorbic Acid (VITAMIN C) 1000 MG tablet Take 1,000 mg by mouth daily.    Marland Kitchen aspirin 81 MG tablet Take 81 mg by mouth 3 (three) times a week.     . benzonatate (TESSALON) 100 MG capsule Take 1 capsule (100 mg total) by mouth 3 (three) times daily as needed for cough. 30 capsule 0  . calcium-vitamin D (OSCAL WITH D) 500-200 MG-UNIT tablet Take 1 tablet by mouth 2 (two) times daily.    . cholecalciferol (VITAMIN D3) 25 MCG (1000 UT) tablet Take 2,000 Units by mouth daily.    Marland Kitchen doxepin (SINEQUAN) 10 MG capsule     . fenofibrate 160 MG tablet TAKE ONE TABLET (160 MG TOTAL) BY MOUTH AT BEDTIME. 90 tablet 0  . fexofenadine (ALLEGRA) 180 MG tablet Take 180 mg by mouth daily.    . fluorouracil (EFUDEX) 5 % cream     . fluticasone (FLONASE) 50 MCG/ACT nasal spray Place 2 sprays into both nostrils daily. (Patient not taking: Reported on 12/25/2018) 16 g 6  . Golimumab (SIMPONI [place]) Inject into the skin.     . hydrochlorothiazide (HYDRODIURIL) 25 MG tablet TAKE 1 TABLET BY MOUTH EVERY DAY 90 tablet 3  . hydroxychloroquine (PLAQUENIL) 200 MG tablet Take 200 mg by mouth daily.     . hyoscyamine (LEVSIN SL) 0.125 MG SL tablet TAKE 1 TABLET BY MOUTH EVERY 6 HOURS AS NEEDED. (Patient not taking: Reported on 12/25/2018) 100 tablet 3  . Hypromellose (ARTIFICIAL TEARS OP) Place 1 drop into both eyes daily as needed (for dry eyes).    Marland Kitchen  Multiple Vitamin (MULTIVITAMIN) tablet Take 1 tablet by mouth daily.      . ondansetron (ZOFRAN) 4 MG tablet Take 1 tablet (4 mg total) by mouth every 6 (six) hours as needed for nausea. (Patient not taking: Reported on 12/25/2018) 20 tablet 0  . predniSONE (DELTASONE) 5 MG tablet Take 5 mg by mouth daily.  3  . Probiotic Product (PROBIOTIC PO) Take by mouth daily.    . promethazine (PHENERGAN) 12.5 MG tablet     . simvastatin (ZOCOR) 40 MG tablet TAKE ONE TABLET (40 MG TOTAL) BY MOUTH AT BEDTIME. 90 tablet 3   No current  facility-administered medications for this visit.      Allergies:  Allergies  Allergen Reactions  . Remicade [Infliximab] Swelling and Other (See Comments)    Facial swelling, throat was closing up.     Past Medical History, Surgical history, Social history, and Family History reviewed and unchanged.   Physical Exam: Blood pressure (!) 161/69, pulse 73, temperature 98.3 F (36.8 C), temperature source Oral, resp. rate 18, height 5\' 4"  (1.626 m), weight 202 lb 14.4 oz (92 kg), SpO2 100 %.   ECOG: 0   General appearance: Comfortable appearing without any discomfort Head: Normocephalic without any trauma Oropharynx: Mucous membranes are moist and pink without any thrush or ulcers. Eyes: Pupils are equal and round reactive to light. Lymph nodes: No cervical, supraclavicular, inguinal or axillary lymphadenopathy.   Heart:regular rate and rhythm.  S1 and S2 without leg edema. Lung: Clear without any rhonchi or wheezes.  No dullness to percussion. Abdomin: Soft, nontender, nondistended with good bowel sounds.  No hepatosplenomegaly. Musculoskeletal: No joint deformity or effusion.  Full range of motion noted. Neurological: No deficits noted on motor, sensory and deep tendon reflex exam. Skin: Mild ecchymosis noted.   Lab Results: Lab Results  Component Value Date   WBC 11.1 (H) 12/26/2018   HGB 13.4 12/26/2018   HCT 40.0 12/26/2018   MCV 92.5 12/26/2018   PLT 103.0 (L) 12/26/2018     Chemistry      Component Value Date/Time   NA 140 12/08/2018 0851   NA 139 06/28/2017 0851   K 3.8 12/08/2018 0851   K 3.8 06/28/2017 0851   CL 99 12/08/2018 0851   CO2 31 12/08/2018 0851   CO2 28 06/28/2017 0851   BUN 20 12/08/2018 0851   BUN 21.4 06/28/2017 0851   CREATININE 0.88 12/08/2018 0851   CREATININE 1.0 06/28/2017 0851      Component Value Date/Time   CALCIUM 10.7 (H) 12/08/2018 0851   CALCIUM 10.8 (H) 06/28/2017 0851   ALKPHOS 45 12/08/2018 0851   ALKPHOS 49 06/28/2017  0851   AST 27 12/08/2018 0851   AST 29 06/28/2017 0851   ALT 18 12/08/2018 0851   ALT 19 06/28/2017 0851   BILITOT 0.5 12/08/2018 0851   BILITOT 0.68 06/28/2017 0851       Impression and Plan:  68 year old woman with:  1. Monocytosis: Diagnosed in 2015 and appears to be reactive in nature and fluctuating.  No evidence of a lymphoproliferative disorder noted on peripheral blood flow cytometry in 2015.  The differential diagnosis was reviewed again today and other considerations are considered less likely.  Myelodysplastic syndrome with monocytic features are considered less likely but remains a possibility.  Her CBC from today showed mild leukocytosis with differential that is pending.  At this time I recommend continue monitoring without any need for further intervention.  2. Thrombocytopenia: Autoimmune in nature with  mild and fluctuating platelet counts.  CBC in January 2020 had fluctuated between 140 and 103 without any active bleeding.  Her platelet count today is 90,000 which is consistent with her baseline.  I do not believe that her epistaxis is related to her mild thrombocytopenia.  She has adequate platelet count to withstand any procedure or bleeding issues.  She had a colonoscopy recently without any bleeding noted.  3. Diverticulitis: She status post colonoscopy completed in January 2020.  4.  Epistaxis: Does not appear to be related to hematological issue.  I recommended evaluation by ENT to rule out dry mucous membranes or sinus issues.  5. Followup: Will be in 6 months  15  minutes was spent with the patient face-to-face today.  More than 50% of time was spent on reviewing disease status update, reviewing laboratory data, reviewing differential diagnosis and answering questions regarding future plan of care.   Zola Button, MD 3/10/20208:35 AM

## 2019-02-10 NOTE — Telephone Encounter (Signed)
Gave avs and calendar ° °

## 2019-02-12 DIAGNOSIS — M0609 Rheumatoid arthritis without rheumatoid factor, multiple sites: Secondary | ICD-10-CM | POA: Diagnosis not present

## 2019-02-25 DIAGNOSIS — H10412 Chronic giant papillary conjunctivitis, left eye: Secondary | ICD-10-CM | POA: Diagnosis not present

## 2019-02-26 DIAGNOSIS — L299 Pruritus, unspecified: Secondary | ICD-10-CM | POA: Diagnosis not present

## 2019-02-26 DIAGNOSIS — L570 Actinic keratosis: Secondary | ICD-10-CM | POA: Diagnosis not present

## 2019-02-28 ENCOUNTER — Other Ambulatory Visit: Payer: Self-pay | Admitting: Family Medicine

## 2019-03-16 DIAGNOSIS — D696 Thrombocytopenia, unspecified: Secondary | ICD-10-CM | POA: Diagnosis not present

## 2019-03-16 DIAGNOSIS — M0609 Rheumatoid arthritis without rheumatoid factor, multiple sites: Secondary | ICD-10-CM | POA: Diagnosis not present

## 2019-03-16 DIAGNOSIS — M2550 Pain in unspecified joint: Secondary | ICD-10-CM | POA: Diagnosis not present

## 2019-03-16 DIAGNOSIS — Z79899 Other long term (current) drug therapy: Secondary | ICD-10-CM | POA: Diagnosis not present

## 2019-03-18 ENCOUNTER — Encounter: Payer: Self-pay | Admitting: Family Medicine

## 2019-03-18 DIAGNOSIS — B029 Zoster without complications: Secondary | ICD-10-CM | POA: Diagnosis not present

## 2019-03-26 ENCOUNTER — Telehealth: Payer: Self-pay | Admitting: Family Medicine

## 2019-03-26 ENCOUNTER — Encounter: Payer: Self-pay | Admitting: Family Medicine

## 2019-03-26 ENCOUNTER — Ambulatory Visit (INDEPENDENT_AMBULATORY_CARE_PROVIDER_SITE_OTHER): Payer: BLUE CROSS/BLUE SHIELD | Admitting: Family Medicine

## 2019-03-26 ENCOUNTER — Other Ambulatory Visit: Payer: Self-pay

## 2019-03-26 VITALS — BP 126/75 | Wt 200.0 lb

## 2019-03-26 DIAGNOSIS — B029 Zoster without complications: Secondary | ICD-10-CM

## 2019-03-26 NOTE — Telephone Encounter (Signed)
Pt has been scheduled.  °

## 2019-03-26 NOTE — Progress Notes (Signed)
Virtual Visit via Video Note  Subjective  CC:  Chief Complaint  Patient presents with  . Herpes Zoster    Completed Valtrex 4/21, and reports that in the past 2 days she has noticed new areas     I connected with [name] on 03/26/19 at  9:20 AM EDT by a video enabled telemedicine application and verified that I am speaking with the correct person using two identifiers. Location patient: Home Location provider: [facility], Office Persons participating in the virtual visit: Ena Dawley, MD Lilli Light, [place] discussed the limitations of evaluation and management by telemedicine and the availability of in person appointments. The patient expressed understanding and agreed to proceed. HPI: Morgan [name] is a 68 y.o. female who was contacted today to address the problems listed above in the chief complaint. . I reviewed mychart messages and recent telephone calls regarding new dx of shingles. Started on her chin last week. Treated with valtrex x 7 days by derm. Hasn't has much pain fortunately. Noted yesterday a few more spots on chin. No other complications: no f/c/s, eye or ear pain. No sob or confusion. Nl appetite. She is immunosuppressed due to RA meds.   Assessment  1. Herpes zoster without complication      Plan   shingles:  Doing fine. Educated and reassured. Skin care discussed. No more antivirals recommended. F/u if worsens.  I discussed the assessment and treatment plan with the patient. The patient was provided an opportunity to ask questions and all were answered. The patient agreed with the plan and demonstrated an understanding of the instructions.   The patient was advised to call back or seek an in-person evaluation if the symptoms worsen or if the condition fails to improve as anticipated. Follow up: f/u as scheduled.   06/01/2019  No orders of the defined types were placed in this encounter.     I reviewed the  patients updated PMH, FH, and SocHx.    Patient Active Problem List   Diagnosis Date Noted  . Bilateral sensorineural hearing loss 02/02/2019  . Hypercalcemia 12/11/2018  . H/O partial resection of colon 01/31/2018  . Diverticulitis with abscess status post robotic low anterior rectosigmoid resection 12/11/2017 12/11/2017  . Rheumatoid arthritis involving multiple sites with positive rheumatoid factor ([place]) 10/31/2017  . Immunosuppressed status ([place]) 10/08/2017  . Obesity with body mass index 30 or greater 01/25/2017  . Current chronic use of systemic steroids 06/08/2016  . Uses hearing aid 12/09/2015  . Leukocytosis 08/27/2014  . Thrombocytopenia ([place]) 11/23/2013  . Fatty liver disease, nonalcoholic 17/61/6073  . Benign colon polyp 10/20/2008  . Essential hypertension 10/20/2008  . Diverticulosis of colon 10/20/2008  . Mixed hyperlipidemia 04/23/2008  . VENOUS INSUFFICIENCY 04/23/2008  . Allergic rhinitis 04/23/2008  . Gastroesophageal reflux disease without esophagitis 04/23/2008   Current Meds  Medication Sig  . calcium-vitamin D (OSCAL WITH D) 500-200 MG-UNIT tablet Take 1 tablet by mouth 2 (two) times daily.  . cholecalciferol (VITAMIN D3) 25 MCG (1000 UT) tablet Take 2,000 Units by mouth daily.  Marland Kitchen doxepin (SINEQUAN) 10 MG capsule   . fenofibrate 160 MG tablet TAKE ONE TABLET (160 MG TOTAL) BY MOUTH AT BEDTIME.  . fexofenadine (ALLEGRA) 180 MG tablet Take 180 mg by mouth daily.  . fluticasone (FLONASE) 50 MCG/ACT nasal spray Place 2 sprays into both nostrils daily.  . Golimumab (SIMPONI [place]) Inject into the skin.   . hydrochlorothiazide (  HYDRODIURIL) 25 MG tablet TAKE 1 TABLET BY MOUTH EVERY DAY  . hydroxychloroquine (PLAQUENIL) 200 MG tablet Take 200 mg by mouth daily.   . hyoscyamine (LEVSIN SL) 0.125 MG SL tablet TAKE 1 TABLET BY MOUTH EVERY 6 HOURS AS NEEDED.  Marland Kitchen Hypromellose (ARTIFICIAL TEARS OP) Place 1 drop into both eyes daily as needed (for dry eyes).  . Multiple  Vitamin (MULTIVITAMIN) tablet Take 1 tablet by mouth daily.    . ondansetron (ZOFRAN) 4 MG tablet Take 1 tablet (4 mg total) by mouth every 6 (six) hours as needed for nausea.  . predniSONE (DELTASONE) 5 MG tablet Take 5 mg by mouth daily.  . Probiotic Product (PROBIOTIC PO) Take by mouth daily.  . promethazine (PHENERGAN) 12.5 MG tablet   . simvastatin (ZOCOR) 40 MG tablet TAKE ONE TABLET (40 MG TOTAL) BY MOUTH AT BEDTIME.    Allergies: Patient is allergic to remicade [infliximab]. Family History: Patient family history includes Arthritis in her mother; Atrial fibrillation in her mother; Breast cancer in an other family member; COPD in her mother; Diabetes in her maternal aunt, maternal uncle, and sister; Healthy in her sister; Heart disease in her mother and sister; Heart failure in her brother and son; Hyperlipidemia in her mother; Hypertension in her mother; Liver disease in her sister. Social History:  Patient  reports that she quit smoking about 17 years ago. Her smoking use included cigarettes. She has a 7.50 pack-year smoking history. She has never used smokeless tobacco. She reports current alcohol use. She reports that she does not use drugs.  Review of Systems: Constitutional: Negative for fever malaise or anorexia Cardiovascular: negative for chest pain Respiratory: negative for SOB or persistent cough Gastrointestinal: negative for abdominal pain  OBJECTIVE Vitals: BP 126/75   Wt 200 lb (90.7 kg)   BMI 34.33 kg/m  General: no acute distress , A&Ox3, appears well Skin: left chin and lower face with one crusted scabbed lesion and several vesicles on erythematous base w/o signs of infection  [name], MD

## 2019-03-26 NOTE — Patient Instructions (Signed)
Please follow up as scheduled for your next visit with me: 06/01/2019   If you have any questions or concerns, please don't hesitate to send me a message via MyChart or call the office at [phone]. Thank you for visiting with Morgan [name] today! It's our pleasure caring for you.

## 2019-03-26 NOTE — Telephone Encounter (Signed)
Copied from [place] [id number]. Topic: Appointment Scheduling - Scheduling Inquiry for Clinic >> Mar 25, 2019  4:24 PM Rutherford Nail, [place] wrote: Reason for CRM: Patient calling and would like to see Dr Jonni Sanger Patient calling and states that she would like to schedule a virtual visit with Dr Jonni Sanger. States that she went to the dermatologist last Wednesday and was diagnosed with shingles- 1 spot under her chin. States that she had been messaging Dr Jonni Sanger about her concern that it might spread. States that Dr Jonni Sanger said if it didn't clear up she would see her on a virtual visit. States that she finished the medication that the dermatologist put her on yesterday and the place that she had originally on her chin is starting to go away, but it is spreading further up chin and on cheeks. Please advise.   CB#: [phone]

## 2019-04-02 ENCOUNTER — Encounter: Payer: Self-pay | Admitting: Family Medicine

## 2019-04-06 ENCOUNTER — Other Ambulatory Visit: Payer: Self-pay | Admitting: Family Medicine

## 2019-04-09 DIAGNOSIS — Z79899 Other long term (current) drug therapy: Secondary | ICD-10-CM | POA: Diagnosis not present

## 2019-04-09 DIAGNOSIS — M0609 Rheumatoid arthritis without rheumatoid factor, multiple sites: Secondary | ICD-10-CM | POA: Diagnosis not present

## 2019-04-24 ENCOUNTER — Encounter: Payer: Self-pay | Admitting: Family Medicine

## 2019-04-24 ENCOUNTER — Other Ambulatory Visit: Payer: Self-pay

## 2019-04-24 ENCOUNTER — Ambulatory Visit (INDEPENDENT_AMBULATORY_CARE_PROVIDER_SITE_OTHER): Payer: BLUE CROSS/BLUE SHIELD | Admitting: Family Medicine

## 2019-04-24 VITALS — BP 126/65 | Temp 97.1°F | Wt 206.0 lb

## 2019-04-24 DIAGNOSIS — S46211A Strain of muscle, fascia and tendon of other parts of biceps, right arm, initial encounter: Secondary | ICD-10-CM | POA: Diagnosis not present

## 2019-04-24 NOTE — Patient Instructions (Addendum)
Please follow up if symptoms do not improve or as needed.   

## 2019-04-24 NOTE — Progress Notes (Signed)
Virtual Visit via Video Note  Subjective  CC:  Chief Complaint  Patient presents with  . Right arm pain    Happened yesterday upper arm, pulled muscle reaching in grocery cart. She has tried Aleve, Essenital oil, and a mix of camphor, wintergreen and menthol.     I connected with [name] on 04/24/19 at  3:00 PM EDT by a video enabled telemedicine application and verified that I am speaking with the correct person using two identifiers. Location patient: Home Location provider: [facility], Office Persons participating in the virtual visit: Ena Dawley, MD Lilli Light, [place] discussed the limitations of evaluation and management by telemedicine and the availability of in person appointments. The patient expressed understanding and agreed to proceed. HPI: Morgan [name] is a 68 y.o. female who was contacted today to address the problems listed above in the chief complaint. Marland Kitchen DOI 5/21: as above, was lifting heavy package of chicken from [employer] cart and felt acute pain in bicpe; arm twisted due to the weight of the package. Still has use of hand / arm but has pain with certain mvt. No weakness. No numbness. No bruising or lumps/masses.  Assessment  1. Strain of right biceps, initial encounter      Plan   Bicep strain:  Routine guidance. RICE, rest, stretches, nsaids and time.  I discussed the assessment and treatment plan with the patient. The patient was provided an opportunity to ask questions and all were answered. The patient agreed with the plan and demonstrated an understanding of the instructions.   The patient was advised to call back or seek an in-person evaluation if the symptoms worsen or if the condition fails to improve as anticipated. Follow up: Return if symptoms worsen or fail to improve.  06/01/2019  No orders of the defined types were placed in this encounter.     I reviewed the patients updated PMH,  FH, and SocHx.    Patient Active Problem List   Diagnosis Date Noted  . Bilateral sensorineural hearing loss 02/02/2019  . Hypercalcemia 12/11/2018  . H/O partial resection of colon 01/31/2018  . Diverticulitis with abscess status post robotic low anterior rectosigmoid resection 12/11/2017 12/11/2017  . Rheumatoid arthritis involving multiple sites with positive rheumatoid factor ([place]) 10/31/2017  . Immunosuppressed status ([place]) 10/08/2017  . Obesity with body mass index 30 or greater 01/25/2017  . Current chronic use of systemic steroids 06/08/2016  . Uses hearing aid 12/09/2015  . Leukocytosis 08/27/2014  . Thrombocytopenia ([place]) 11/23/2013  . Fatty liver disease, nonalcoholic 24/58/0998  . Benign colon polyp 10/20/2008  . Essential hypertension 10/20/2008  . Diverticulosis of colon 10/20/2008  . Mixed hyperlipidemia 04/23/2008  . VENOUS INSUFFICIENCY 04/23/2008  . Allergic rhinitis 04/23/2008  . Gastroesophageal reflux disease without esophagitis 04/23/2008   Current Meds  Medication Sig  . acetaminophen (TYLENOL) 500 MG tablet Take 500 mg by mouth every 6 (six) hours as needed for moderate pain or headache.   . Ascorbic Acid (VITAMIN C) 1000 MG tablet Take 1,000 mg by mouth daily.  Marland Kitchen aspirin 81 MG tablet Take 81 mg by mouth 3 (three) times a week.   . calcium-vitamin D (OSCAL WITH D) 500-200 MG-UNIT tablet Take 1 tablet by mouth 2 (two) times daily.  . cholecalciferol (VITAMIN D3) 25 MCG (1000 UT) tablet Take 2,000 Units by mouth daily.  Marland Kitchen doxepin (SINEQUAN) 10 MG capsule   .  fenofibrate 160 MG tablet TAKE ONE TABLET (160 MG TOTAL) BY MOUTH AT BEDTIME.  . fexofenadine (ALLEGRA) 180 MG tablet Take 180 mg by mouth daily.  . fluorouracil (EFUDEX) 5 % cream   . fluticasone (FLONASE) 50 MCG/ACT nasal spray Place 2 sprays into both nostrils daily.  . Golimumab (SIMPONI [place]) Inject into the skin.   . hydrochlorothiazide (HYDRODIURIL) 25 MG tablet TAKE 1 TABLET BY MOUTH EVERY DAY  .  hydroxychloroquine (PLAQUENIL) 200 MG tablet Take 200 mg by mouth daily.   . hyoscyamine (LEVSIN SL) 0.125 MG SL tablet TAKE 1 TABLET BY MOUTH EVERY 6 HOURS AS NEEDED.  Marland Kitchen Hypromellose (ARTIFICIAL TEARS OP) Place 1 drop into both eyes daily as needed (for dry eyes).  . Multiple Vitamin (MULTIVITAMIN) tablet Take 1 tablet by mouth daily.    . ondansetron (ZOFRAN) 4 MG tablet Take 1 tablet (4 mg total) by mouth every 6 (six) hours as needed for nausea.  . predniSONE (DELTASONE) 5 MG tablet Take 5 mg by mouth daily.  . Probiotic Product (PROBIOTIC PO) Take by mouth daily.  . promethazine (PHENERGAN) 12.5 MG tablet   . simvastatin (ZOCOR) 40 MG tablet TAKE ONE TABLET (40 MG TOTAL) BY MOUTH AT BEDTIME.  . [DISCONTINUED] valACYclovir (VALTREX) 1000 MG tablet TAKE 1 TABLET BY MOUTH THREE TIMES A DAY FOR 7 DAYS    Allergies: Patient is allergic to remicade [infliximab]. Family History: Patient family history includes Arthritis in her mother; Atrial fibrillation in her mother; Breast cancer in an other family member; COPD in her mother; Diabetes in her maternal aunt, maternal uncle, and sister; Healthy in her sister; Heart disease in her mother and sister; Heart failure in her brother and son; Hyperlipidemia in her mother; Hypertension in her mother; Liver disease in her sister. Social History:  Patient  reports that she quit smoking about 17 years ago. Her smoking use included cigarettes. She has a 7.50 pack-year smoking history. She has never used smokeless tobacco. She reports current alcohol use. She reports that she does not use drugs.  Review of Systems: Constitutional: Negative for fever malaise or anorexia Cardiovascular: negative for chest pain Respiratory: negative for SOB or persistent cough Gastrointestinal: negative for abdominal pain  OBJECTIVE Vitals: BP 126/65   Temp (!) 97.1 F (36.2 C) (Oral)   Wt 206 lb (93.4 kg)   BMI 35.36 kg/m  General: no acute distress , A&Ox3 Has FROM  at right shoulder. Full strength with bicep and tricep but pain with resisted bicep self testing.   [name], MD

## 2019-05-27 ENCOUNTER — Other Ambulatory Visit: Payer: Self-pay | Admitting: Family Medicine

## 2019-05-28 DIAGNOSIS — H00012 Hordeolum externum right lower eyelid: Secondary | ICD-10-CM | POA: Diagnosis not present

## 2019-05-28 DIAGNOSIS — L570 Actinic keratosis: Secondary | ICD-10-CM | POA: Diagnosis not present

## 2019-06-01 ENCOUNTER — Encounter: Payer: Self-pay | Admitting: Family Medicine

## 2019-06-01 ENCOUNTER — Other Ambulatory Visit: Payer: Self-pay

## 2019-06-01 ENCOUNTER — Ambulatory Visit (INDEPENDENT_AMBULATORY_CARE_PROVIDER_SITE_OTHER): Payer: BC Managed Care – PPO

## 2019-06-01 ENCOUNTER — Ambulatory Visit: Payer: BC Managed Care – PPO | Admitting: Family Medicine

## 2019-06-01 VITALS — BP 126/74 | HR 72 | Temp 98.4°F | Resp 16 | Ht 64.0 in | Wt 205.8 lb

## 2019-06-01 DIAGNOSIS — S4991XA Unspecified injury of right shoulder and upper arm, initial encounter: Secondary | ICD-10-CM | POA: Diagnosis not present

## 2019-06-01 DIAGNOSIS — I10 Essential (primary) hypertension: Secondary | ICD-10-CM

## 2019-06-01 DIAGNOSIS — S46011D Strain of muscle(s) and tendon(s) of the rotator cuff of right shoulder, subsequent encounter: Secondary | ICD-10-CM | POA: Diagnosis not present

## 2019-06-01 DIAGNOSIS — M25511 Pain in right shoulder: Secondary | ICD-10-CM | POA: Diagnosis not present

## 2019-06-01 DIAGNOSIS — E782 Mixed hyperlipidemia: Secondary | ICD-10-CM

## 2019-06-01 NOTE — Telephone Encounter (Signed)
Please cal to schedule

## 2019-06-01 NOTE — Progress Notes (Signed)
Subjective  CC:  Chief Complaint  Patient presents with  . Hypertension  . Shoulder Pain    Right side, was seen and evaluated on 5/22.. Still having pain constantly worse at times with certain movement.. Takes Tylenol or Advil for increased pain  . Hyperlipidemia    HPI: Morgan [name] is a 68 y.o. female who presents to the office today to address the problems listed above in the chief complaint.  Hypertension f/u: Control is fair . Pt reports she is doing well. taking medications as instructed, no medication side effects noted, no TIAs, no chest pain on exertion, no dyspnea on exertion, no swelling of ankles. She denies adverse effects from his BP medications. Compliance with medication is good.   HLD: on meds and has been well controlled. No AEs  Right shoulder pain: see last note 5/22. Still with moderate pain daily. Limited ROM: does well until tries to lift arm over head or reach forward. Pain awakens her at night. No neck pain or radicular sxs. No loss of strength. Used nsaids, ice and rest w/o resolution.   Assessment  1. Essential hypertension   2. Mixed hyperlipidemia   3. Acute pain of right shoulder   4. Traumatic tear of right rotator cuff, unspecified tear extent, subsequent encounter      Plan    Hypertension f/u: BP control is well controlled. Continue same  Hyperlipidemia f/u: well controlled.   Right shoulder pain: exam with decreased abduction, + empty can testing, decreased bicep strength due to pain or weakness and ant shoulder ttp: ? RC tear or bicep tear. Check xray but warrants MRI - to ortho if needed. Pt declines PT/steroid injection at this time. Will await imaging results. Ice and prn nsaids.  Education regarding management of these chronic disease states was given. Management strategies discussed on successive visits include dietary and exercise recommendations, goals of achieving and maintaining IBW, and lifestyle modifications aiming for adequate  sleep and minimizing stressors.   Follow up: Return in about 6 months (around 12/01/2019) for complete physical, follow up Hypertension.  Orders Placed This Encounter  Procedures  . DG Shoulder Right  . MR Shoulder Right Wo Contrast   No orders of the defined types were placed in this encounter.     BP Readings from Last 3 Encounters:  06/01/19 126/74  04/24/19 126/65  03/26/19 126/75   Wt Readings from Last 3 Encounters:  06/01/19 205 lb 12.8 oz (93.4 kg)  04/24/19 206 lb (93.4 kg)  03/26/19 200 lb (90.7 kg)    Lab Results  Component Value Date   CHOL 157 12/08/2018   CHOL 139 12/09/2017   CHOL 144 11/18/2013   Lab Results  Component Value Date   HDL 66.80 12/08/2018   HDL 44.60 12/09/2017   HDL 43.10 11/18/2013   Lab Results  Component Value Date   LDLCALC 72 12/08/2018   LDLCALC 70 12/09/2017   LDLCALC 80 11/18/2013   Lab Results  Component Value Date   TRIG 90.0 12/08/2018   TRIG 121.0 12/09/2017   TRIG 103.0 11/18/2013   Lab Results  Component Value Date   CHOLHDL 2 12/08/2018   CHOLHDL 3 12/09/2017   CHOLHDL 3 11/18/2013   Lab Results  Component Value Date   LDLDIRECT 100.9 05/28/2007   LDLDIRECT 109.2 01/28/2007   Lab Results  Component Value Date   CREATININE 0.88 12/08/2018   BUN 20 12/08/2018   NA 140 12/08/2018   K 3.8 12/08/2018  CL 99 12/08/2018   CO2 31 12/08/2018    The 10-year ASCVD risk score Mikey Bussing DC Jr., et al., 2013) is: 11%   Values used to calculate the score:     Age: 63 years     Sex: Female     Is Non-Hispanic African American: No     Diabetic: No     Tobacco smoker: No     Systolic Blood Pressure: 244 mmHg     Is BP treated: Yes     HDL Cholesterol: 66.8 mg/dL     Total Cholesterol: 157 mg/dL  I reviewed the patients updated PMH, FH, and SocHx.    Patient Active Problem List   Diagnosis Date Noted  . Bilateral sensorineural hearing loss 02/02/2019  . Hypercalcemia 12/11/2018  . H/O partial resection of  colon 01/31/2018  . Diverticulitis with abscess status post robotic low anterior rectosigmoid resection 12/11/2017 12/11/2017  . Rheumatoid arthritis involving multiple sites with positive rheumatoid factor ([place]) 10/31/2017  . Immunosuppressed status ([place]) 10/08/2017  . Obesity with body mass index 30 or greater 01/25/2017  . Current chronic use of systemic steroids 06/08/2016  . Uses hearing aid 12/09/2015  . Leukocytosis 08/27/2014  . Thrombocytopenia ([place]) 11/23/2013  . Fatty liver disease, nonalcoholic 12/05/7251  . Benign colon polyp 10/20/2008  . Essential hypertension 10/20/2008  . Diverticulosis of colon 10/20/2008  . Mixed hyperlipidemia 04/23/2008  . VENOUS INSUFFICIENCY 04/23/2008  . Allergic rhinitis 04/23/2008  . Gastroesophageal reflux disease without esophagitis 04/23/2008    Allergies: Remicade [infliximab]  Social History: Patient  reports that she quit smoking about 17 years ago. Her smoking use included cigarettes. She has a 7.50 pack-year smoking history. She has never used smokeless tobacco. She reports current alcohol use. She reports that she does not use drugs.  Current Meds  Medication Sig  . acetaminophen (TYLENOL) 500 MG tablet Take 500 mg by mouth every 6 (six) hours as needed for moderate pain or headache.   . Ascorbic Acid (VITAMIN C) 1000 MG tablet Take 1,000 mg by mouth daily.  Marland Kitchen aspirin 81 MG tablet Take 81 mg by mouth 3 (three) times a week.   . calcium-vitamin D (OSCAL WITH D) 500-200 MG-UNIT tablet Take 1 tablet by mouth 2 (two) times daily.  . cephALEXin (KEFLEX) 500 MG capsule   . cholecalciferol (VITAMIN D3) 25 MCG (1000 UT) tablet Take 2,000 Units by mouth daily.  Marland Kitchen doxepin (SINEQUAN) 10 MG capsule   . fenofibrate 160 MG tablet TAKE 1 TABLET AT BEDTIME  . fexofenadine (ALLEGRA) 180 MG tablet Take 180 mg by mouth daily.  . fluticasone (FLONASE) 50 MCG/ACT nasal spray Place 2 sprays into both nostrils daily.  . Golimumab (SIMPONI [place]) Inject into  the skin.   . hydrochlorothiazide (HYDRODIURIL) 25 MG tablet TAKE 1 TABLET BY MOUTH EVERY DAY  . hydroxychloroquine (PLAQUENIL) 200 MG tablet Take 200 mg by mouth daily.   . Hypromellose (ARTIFICIAL TEARS OP) Place 1 drop into both eyes daily as needed (for dry eyes).  . Multiple Vitamin (MULTIVITAMIN) tablet Take 1 tablet by mouth daily.    . ondansetron (ZOFRAN) 4 MG tablet Take 1 tablet (4 mg total) by mouth every 6 (six) hours as needed for nausea.  . predniSONE (DELTASONE) 5 MG tablet Take 5 mg by mouth daily.  . Probiotic Product (PROBIOTIC PO) Take by mouth daily.  . promethazine (PHENERGAN) 12.5 MG tablet   . simvastatin (ZOCOR) 40 MG tablet TAKE ONE TABLET (40 MG TOTAL) BY MOUTH  AT BEDTIME.    Review of Systems: Cardiovascular: negative for chest pain, palpitations, leg swelling, orthopnea Respiratory: negative for SOB, wheezing or persistent cough Gastrointestinal: negative for abdominal pain Genitourinary: negative for dysuria or gross hematuria  Objective  Vitals: BP 126/74   Pulse 72   Temp 98.4 F (36.9 C) (Oral)   Resp 16   Ht 5\' 4"  (1.626 m)   Wt 205 lb 12.8 oz (93.4 kg)   SpO2 97%   BMI 35.33 kg/m  General: no acute distress  Psych:  Alert and oriented, normal mood and affect HEENT:  Normocephalic, atraumatic, supple neck  Cardiovascular:  RRR without murmur. no edema Respiratory:  Good breath sounds bilaterally, CTAB with normal respiratory effort Skin:  Warm, no rashes MSK: right shoulder ant ttp, FROM passive, active ROM almost full abduction but limited by pain, decreased bicep strength, neg empty can and neg drop test. Nl grip. Nl elbow.  Commons side effects, risks, benefits, and alternatives for medications and treatment plan prescribed today were discussed, and the patient expressed understanding of the given instructions. Patient is instructed to call or message via MyChart if he/she has any questions or concerns regarding our treatment plan. No barriers  to understanding were identified. We discussed Red Flag symptoms and signs in detail. Patient expressed understanding regarding what to do in case of urgent or emergency type symptoms.   Medication list was reconciled, printed and provided to the patient in AVS. Patient instructions and summary information was reviewed with the patient as documented in the AVS. This note was prepared with assistance of Dragon voice recognition software. Occasional wrong-word or sound-a-like substitutions may have occurred due to the inherent limitations of voice recognition software

## 2019-06-01 NOTE — Patient Instructions (Signed)
Please return in 6 months for your annual complete physical; please come fasting.  I have ordered an MRI for your right shoulder. We will call you to schedule.   If you have any questions or concerns, please don't hesitate to send me a message via MyChart or call the office at [phone]. Thank you for visiting with Morgan [name] today! It's our pleasure caring for you.

## 2019-06-03 ENCOUNTER — Encounter: Payer: Self-pay | Admitting: Family Medicine

## 2019-06-03 ENCOUNTER — Other Ambulatory Visit: Payer: Self-pay

## 2019-06-03 ENCOUNTER — Ambulatory Visit (INDEPENDENT_AMBULATORY_CARE_PROVIDER_SITE_OTHER): Payer: BC Managed Care – PPO | Admitting: Family Medicine

## 2019-06-03 VITALS — BP 126/68 | HR 79 | Temp 98.2°F | Resp 16 | Ht 64.0 in | Wt 205.0 lb

## 2019-06-03 DIAGNOSIS — S46011D Strain of muscle(s) and tendon(s) of the rotator cuff of right shoulder, subsequent encounter: Secondary | ICD-10-CM

## 2019-06-03 DIAGNOSIS — M25511 Pain in right shoulder: Secondary | ICD-10-CM

## 2019-06-03 MED ORDER — TRIAMCINOLONE ACETONIDE 40 MG/ML IJ SUSP
40.0000 mg | Freq: Once | INTRAMUSCULAR | Status: AC
  Administered 2019-06-03: 40 mg via INTRAMUSCULAR

## 2019-06-03 NOTE — Patient Instructions (Signed)
Please follow up if symptoms do not improve or as needed.   You had a steroid injection today.   Things to be aware of after this injection are listed below:  You may experience no significant improvement or even a slight worsening in your symptoms during the first 24 to 48 hours.  After that we expect your symptoms to improve gradually over the next 2 weeks for the medicine to have its maximal effect.  You should continue to have improvement out to 6 weeks after your injection.  I recommend icing the site of the injection for 20 minutes  1-2 times the day of your injection  You may shower but no swimming, tub bath or Jacuzzi for 24 hours.  If your bandage falls off this does not need to be replaced.  It is appropriate to remove the bandage after 4 hours.  You may resume light activities as tolerated.     POSSIBLE PROCEDURE SIDE EFFECTS: The side effects of the injection are usually fairly minimal however if you may experience some of the following side effects that are usually self-limited and will is off on their own.  If you are concerned please feel free to call the office with questions:             Increased numbness or tingling             Nausea or vomiting             Swelling or bruising at the injection site    Please call our office if if you experience any of the following symptoms over the next week as these can be signs of infection:              Fever greater than 100.5F             Significant swelling at the injection site             Significant redness or drainage from the injection site      

## 2019-06-03 NOTE — Progress Notes (Signed)
Here for recommended steroid injection of right shoulder for presumed RC tendinopathy or tear. See note from 2 days ago. Nl xray reviewed.   Shoulder Injection Procedure Note  Pre-operative Diagnosis: right RC tear or tendinopathy  Post-operative Diagnosis: same  Indications: Symptomatic relief and failed conservative measures  Anesthesia: Lidocaine 1% without epinephrine without added sodium bicarbonate  Procedure Details   Verbal consent was obtained for the procedure. The shoulder was prepped with alcohol  and the skin was anesthetized with cold spray. Using a 22 gauge needle the subacromial space was injected with 4 mL 1% lidocaine and 1 mL of triamcinolone (KENALOG) 40mg /ml under the posterior aspect of the acromion. The injection site was cleansed with topical isopropyl alcohol and a dressing was applied.  Complications:  None; patient tolerated the procedure well.

## 2019-06-03 NOTE — Addendum Note (Signed)
Addended by: Layla Barter on: 06/03/2019 04:50 PM   Modules accepted: Orders

## 2019-06-04 ENCOUNTER — Ambulatory Visit: Payer: BC Managed Care – PPO | Admitting: Family Medicine

## 2019-06-04 DIAGNOSIS — M0609 Rheumatoid arthritis without rheumatoid factor, multiple sites: Secondary | ICD-10-CM | POA: Diagnosis not present

## 2019-06-04 DIAGNOSIS — Z79899 Other long term (current) drug therapy: Secondary | ICD-10-CM | POA: Diagnosis not present

## 2019-06-23 ENCOUNTER — Ambulatory Visit
Admission: RE | Admit: 2019-06-23 | Discharge: 2019-06-23 | Disposition: A | Discharge status: Home / Self Care | Payer: BC Managed Care – PPO | Source: Ambulatory Visit | Attending: Family Medicine | Admitting: Family Medicine

## 2019-06-23 ENCOUNTER — Other Ambulatory Visit: Payer: Self-pay

## 2019-06-23 DIAGNOSIS — M75111 Incomplete rotator cuff tear or rupture of right shoulder, not specified as traumatic: Secondary | ICD-10-CM | POA: Diagnosis not present

## 2019-06-23 DIAGNOSIS — M19011 Primary osteoarthritis, right shoulder: Secondary | ICD-10-CM | POA: Diagnosis not present

## 2019-06-23 DIAGNOSIS — S46011D Strain of muscle(s) and tendon(s) of the rotator cuff of right shoulder, subsequent encounter: Secondary | ICD-10-CM

## 2019-06-23 DIAGNOSIS — M25511 Pain in right shoulder: Secondary | ICD-10-CM

## 2019-06-24 ENCOUNTER — Other Ambulatory Visit: Payer: Self-pay | Admitting: *Deleted

## 2019-06-24 DIAGNOSIS — M75121 Complete rotator cuff tear or rupture of right shoulder, not specified as traumatic: Secondary | ICD-10-CM

## 2019-06-24 NOTE — Progress Notes (Signed)
Please call patient: I have reviewed his/her MRI results. MRI shows tow complete rotator cuff tears! Please refer to Dr. Onnie Graham at [facility]. Thanks.

## 2019-07-02 ENCOUNTER — Other Ambulatory Visit: Payer: Self-pay | Admitting: *Deleted

## 2019-07-02 MED ORDER — FENOFIBRATE 160 MG PO TABS: 160.0000 mg | ORAL_TABLET | Freq: Every day | ORAL | 0 refills | Status: DC

## 2019-07-06 DIAGNOSIS — M75101 Unspecified rotator cuff tear or rupture of right shoulder, not specified as traumatic: Secondary | ICD-10-CM | POA: Diagnosis not present

## 2019-07-06 DIAGNOSIS — M25511 Pain in right shoulder: Secondary | ICD-10-CM | POA: Diagnosis not present

## 2019-07-10 DIAGNOSIS — M75101 Unspecified rotator cuff tear or rupture of right shoulder, not specified as traumatic: Secondary | ICD-10-CM | POA: Diagnosis not present

## 2019-07-10 DIAGNOSIS — M25511 Pain in right shoulder: Secondary | ICD-10-CM | POA: Diagnosis not present

## 2019-07-10 DIAGNOSIS — M25611 Stiffness of right shoulder, not elsewhere classified: Secondary | ICD-10-CM | POA: Diagnosis not present

## 2019-07-14 ENCOUNTER — Encounter: Payer: Self-pay | Admitting: Family Medicine

## 2019-07-14 DIAGNOSIS — M25511 Pain in right shoulder: Secondary | ICD-10-CM | POA: Diagnosis not present

## 2019-07-14 DIAGNOSIS — M75101 Unspecified rotator cuff tear or rupture of right shoulder, not specified as traumatic: Secondary | ICD-10-CM | POA: Insufficient documentation

## 2019-07-14 DIAGNOSIS — M25611 Stiffness of right shoulder, not elsewhere classified: Secondary | ICD-10-CM | POA: Diagnosis not present

## 2019-07-16 DIAGNOSIS — M25511 Pain in right shoulder: Secondary | ICD-10-CM | POA: Diagnosis not present

## 2019-07-16 DIAGNOSIS — M25611 Stiffness of right shoulder, not elsewhere classified: Secondary | ICD-10-CM | POA: Diagnosis not present

## 2019-07-16 DIAGNOSIS — M75101 Unspecified rotator cuff tear or rupture of right shoulder, not specified as traumatic: Secondary | ICD-10-CM | POA: Diagnosis not present

## 2019-07-20 DIAGNOSIS — Z79899 Other long term (current) drug therapy: Secondary | ICD-10-CM | POA: Diagnosis not present

## 2019-07-20 DIAGNOSIS — M150 Primary generalized (osteo)arthritis: Secondary | ICD-10-CM | POA: Diagnosis not present

## 2019-07-20 DIAGNOSIS — M0609 Rheumatoid arthritis without rheumatoid factor, multiple sites: Secondary | ICD-10-CM | POA: Diagnosis not present

## 2019-07-20 DIAGNOSIS — M2550 Pain in unspecified joint: Secondary | ICD-10-CM | POA: Diagnosis not present

## 2019-07-21 DIAGNOSIS — M25511 Pain in right shoulder: Secondary | ICD-10-CM | POA: Diagnosis not present

## 2019-07-21 DIAGNOSIS — M75101 Unspecified rotator cuff tear or rupture of right shoulder, not specified as traumatic: Secondary | ICD-10-CM | POA: Diagnosis not present

## 2019-07-21 DIAGNOSIS — M25611 Stiffness of right shoulder, not elsewhere classified: Secondary | ICD-10-CM | POA: Diagnosis not present

## 2019-07-23 DIAGNOSIS — M25511 Pain in right shoulder: Secondary | ICD-10-CM | POA: Diagnosis not present

## 2019-07-23 DIAGNOSIS — M75101 Unspecified rotator cuff tear or rupture of right shoulder, not specified as traumatic: Secondary | ICD-10-CM | POA: Diagnosis not present

## 2019-07-23 DIAGNOSIS — M25611 Stiffness of right shoulder, not elsewhere classified: Secondary | ICD-10-CM | POA: Diagnosis not present

## 2019-07-29 DIAGNOSIS — M25511 Pain in right shoulder: Secondary | ICD-10-CM | POA: Diagnosis not present

## 2019-07-29 DIAGNOSIS — M25611 Stiffness of right shoulder, not elsewhere classified: Secondary | ICD-10-CM | POA: Diagnosis not present

## 2019-07-29 DIAGNOSIS — M75101 Unspecified rotator cuff tear or rupture of right shoulder, not specified as traumatic: Secondary | ICD-10-CM | POA: Diagnosis not present

## 2019-07-30 DIAGNOSIS — M0609 Rheumatoid arthritis without rheumatoid factor, multiple sites: Secondary | ICD-10-CM | POA: Diagnosis not present

## 2019-08-05 DIAGNOSIS — M25511 Pain in right shoulder: Secondary | ICD-10-CM | POA: Diagnosis not present

## 2019-08-05 DIAGNOSIS — M75101 Unspecified rotator cuff tear or rupture of right shoulder, not specified as traumatic: Secondary | ICD-10-CM | POA: Diagnosis not present

## 2019-08-05 DIAGNOSIS — M25611 Stiffness of right shoulder, not elsewhere classified: Secondary | ICD-10-CM | POA: Diagnosis not present

## 2019-08-12 ENCOUNTER — Ambulatory Visit: Payer: BLUE CROSS/BLUE SHIELD | Admitting: Oncology

## 2019-08-12 ENCOUNTER — Other Ambulatory Visit: Payer: BLUE CROSS/BLUE SHIELD

## 2019-08-19 DIAGNOSIS — M75101 Unspecified rotator cuff tear or rupture of right shoulder, not specified as traumatic: Secondary | ICD-10-CM | POA: Diagnosis not present

## 2019-08-21 ENCOUNTER — Encounter: Payer: Self-pay | Admitting: Family Medicine

## 2019-08-26 ENCOUNTER — Other Ambulatory Visit: Payer: Self-pay

## 2019-08-26 ENCOUNTER — Inpatient Hospital Stay: Payer: Medicare Other | Attending: Oncology

## 2019-08-26 ENCOUNTER — Inpatient Hospital Stay (HOSPITAL_BASED_OUTPATIENT_CLINIC_OR_DEPARTMENT_OTHER): Payer: Medicare Other | Admitting: Oncology

## 2019-08-26 VITALS — BP 150/62 | HR 73 | Temp 98.2°F | Resp 18 | Ht 64.0 in | Wt 211.4 lb

## 2019-08-26 DIAGNOSIS — D72821 Monocytosis (symptomatic): Secondary | ICD-10-CM | POA: Insufficient documentation

## 2019-08-26 DIAGNOSIS — D693 Immune thrombocytopenic purpura: Secondary | ICD-10-CM | POA: Insufficient documentation

## 2019-08-26 DIAGNOSIS — Z79899 Other long term (current) drug therapy: Secondary | ICD-10-CM | POA: Insufficient documentation

## 2019-08-26 DIAGNOSIS — D72829 Elevated white blood cell count, unspecified: Secondary | ICD-10-CM | POA: Diagnosis not present

## 2019-08-26 DIAGNOSIS — D696 Thrombocytopenia, unspecified: Secondary | ICD-10-CM

## 2019-08-26 LAB — CMP (CANCER CENTER ONLY)
ALT: 31 U/L (ref 0–44)
AST: 37 U/L (ref 15–41)
Albumin: 4.5 g/dL (ref 3.5–5.0)
Alkaline Phosphatase: 49 U/L (ref 38–126)
Anion gap: 13 (ref 5–15)
BUN: 23 mg/dL (ref 8–23)
CO2: 29 mmol/L (ref 22–32)
Calcium: 10.5 mg/dL — ABNORMAL HIGH (ref 8.9–10.3)
Chloride: 101 mmol/L (ref 98–111)
Creatinine: 1 mg/dL (ref 0.44–1.00)
GFR, Est AFR Am: >60 mL/min (ref >60)
GFR, Estimated: 57 mL/min — ABNORMAL LOW (ref >60)
Glucose, Bld: 93 mg/dL (ref 70–99)
Potassium: 4 mmol/L (ref 3.5–5.1)
Sodium: 143 mmol/L (ref 135–145)
Total Bilirubin: 0.5 mg/dL (ref 0.3–1.2)
Total Protein: 7.7 g/dL (ref 6.5–8.1)

## 2019-08-26 LAB — CBC WITH DIFFERENTIAL (CANCER CENTER ONLY)
Abs Immature Granulocytes: 0.63 10*3/uL — ABNORMAL HIGH (ref 0.00–0.07)
Basophils Absolute: 0 10*3/uL (ref 0.0–0.1)
Basophils Relative: 0 %
Eosinophils Absolute: 0 10*3/uL (ref 0.0–0.5)
Eosinophils Relative: 0 %
HCT: 42.2 % (ref 36.0–46.0)
Hemoglobin: 13.8 g/dL (ref 12.0–15.0)
Immature Granulocytes: 5 %
Lymphocytes Relative: 16 %
Lymphs Abs: 2.1 10*3/uL (ref 0.7–4.0)
MCH: 31.3 pg (ref 26.0–34.0)
MCHC: 32.7 g/dL (ref 30.0–36.0)
MCV: 95.7 fL (ref 80.0–100.0)
Monocytes Absolute: 3.1 10*3/uL — ABNORMAL HIGH (ref 0.1–1.0)
Monocytes Relative: 25 %
Neutro Abs: 6.7 10*3/uL (ref 1.7–7.7)
Neutrophils Relative %: 54 %
Platelet Count: 146 10*3/uL — ABNORMAL LOW (ref 150–400)
RBC: 4.41 MIL/uL (ref 3.87–5.11)
RDW: 15.3 % (ref 11.5–15.5)
WBC Count: 12.6 10*3/uL — ABNORMAL HIGH (ref 4.0–10.5)
nRBC: 0 % (ref 0.0–0.2)

## 2019-08-26 NOTE — Progress Notes (Signed)
Hematology and Oncology Follow Up Visit  Morgan [name] [id number] 23-Mar-1951 68 y.o. 08/26/2019 9:45 AM [name], MDAndy, Morgan Fetch, MD   Principle Diagnosis: 68 year old woman with:  1.  Leukocytosis with monocytosis diagnosed in 2015 due to reactive process and autoimmune disease. 2.  Autoimmune thrombocytopenia with fluctuating platelet count but does not require any treatment.  This was noted in 2015.   Current therapy: Active surveillance.  Interim History:  [name] is here for a follow-up visit.  Since last visit, she reports no major changes in her health.  She is scheduled to have a right shoulder replacement given the increased hip pain and deterioration that is not improved with physical therapy.  She continues to be on Plaquenil and prednisone without any exacerbation related to her autoimmune nephritis.  She denies any bleeding or thrombosis episodes.  He denies any recurrent infections or hospitalizations.  He denied headaches, blurry vision, syncope or seizures.  Denies any fevers, chills or sweats.  Denied chest pain, palpitation, orthopnea or leg edema.  Denied cough, wheezing or hemoptysis.  Denied nausea, vomiting or abdominal pain.  Denies any constipation or diarrhea.  Denies any frequency urgency or hesitancy.  Denies any arthralgias or myalgias.  Denies any skin rashes or lesions.  Denies any bleeding or clotting tendency.  Denies any easy bruising.  Denies any hair or nail changes.  Denies any anxiety or depression.  Remaining review of system is negative.    Medications: Without any changes on review. Current Outpatient Medications  Medication Sig Dispense Refill  . acetaminophen (TYLENOL) 500 MG tablet Take 500 mg by mouth every 6 (six) hours as needed for moderate pain or headache.     . Ascorbic Acid (VITAMIN C) 1000 MG tablet Take 1,000 mg by mouth daily.    Marland Kitchen aspirin 81 MG tablet Take 81 mg by mouth 3 (three) times a week.     . calcium-vitamin D  (OSCAL WITH D) 500-200 MG-UNIT tablet Take 1 tablet by mouth 2 (two) times daily.    . cephALEXin (KEFLEX) 500 MG capsule     . cholecalciferol (VITAMIN D3) 25 MCG (1000 UT) tablet Take 2,000 Units by mouth daily.    Marland Kitchen doxepin (SINEQUAN) 10 MG capsule     . fenofibrate 160 MG tablet Take 1 tablet (160 mg total) by mouth at bedtime. 90 tablet 0  . fexofenadine (ALLEGRA) 180 MG tablet Take 180 mg by mouth daily.    . fluticasone (FLONASE) 50 MCG/ACT nasal spray Place 2 sprays into both nostrils daily. 16 g 6  . Golimumab (SIMPONI ) Inject into the skin.     . hydrochlorothiazide (HYDRODIURIL) 25 MG tablet TAKE 1 TABLET BY MOUTH EVERY DAY 90 tablet 3  . hydroxychloroquine (PLAQUENIL) 200 MG tablet Take 200 mg by mouth daily.     . Hypromellose (ARTIFICIAL TEARS OP) Place 1 drop into both eyes daily as needed (for dry eyes).    . Multiple Vitamin (MULTIVITAMIN) tablet Take 1 tablet by mouth daily.      . ondansetron (ZOFRAN) 4 MG tablet Take 1 tablet (4 mg total) by mouth every 6 (six) hours as needed for nausea. 20 tablet 0  . predniSONE (DELTASONE) 5 MG tablet Take 5 mg by mouth daily.  3  . Probiotic Product (PROBIOTIC PO) Take by mouth daily.    . promethazine (PHENERGAN) 12.5 MG tablet     . simvastatin (ZOCOR) 40 MG tablet TAKE ONE TABLET (40 MG TOTAL) BY MOUTH  AT BEDTIME. 90 tablet 3   No current facility-administered medications for this visit.      Allergies:  Allergies  Allergen Reactions  . Remicade [Infliximab] Swelling and Other (See Comments)    Facial swelling, throat was closing up.     Past Medical History, Surgical history, Social history, and Family History updated without any changes.   Physical Exam: Blood pressure (!) 150/62, pulse 73, temperature 98.2 F (36.8 C), temperature source Temporal, resp. rate 18, height 5\' 4"  (1.626 m), weight 211 lb 6.4 oz (95.9 kg), SpO2 99 %.   ECOG: 0   . General appearance: Alert, awake without any distress. Head:  Atraumatic without abnormalities Oropharynx: Without any thrush or ulcers. Eyes: No scleral icterus. Lymph nodes: No lymphadenopathy noted in the cervical, supraclavicular, or axillary nodes Heart:regular rate and rhythm, without any murmurs or gallops.   Lung: Clear to auscultation without any rhonchi, wheezes or dullness to percussion. Abdomin: Soft, nontender without any shifting dullness or ascites. Musculoskeletal: No clubbing or cyanosis. Neurological: No motor or sensory deficits. Skin: No rashes or lesions.    Lab Results: Lab Results  Component Value Date   WBC 12.6 (H) 08/26/2019   HGB 13.8 08/26/2019   HCT 42.2 08/26/2019   MCV 95.7 08/26/2019   PLT 146 (L) 08/26/2019     Chemistry      Component Value Date/Time   NA 140 12/08/2018 0851   NA 139 06/28/2017 0851   K 3.8 12/08/2018 0851   K 3.8 06/28/2017 0851   CL 99 12/08/2018 0851   CO2 31 12/08/2018 0851   CO2 28 06/28/2017 0851   BUN 20 12/08/2018 0851   BUN 21.4 06/28/2017 0851   CREATININE 0.88 12/08/2018 0851   CREATININE 1.0 06/28/2017 0851      Component Value Date/Time   CALCIUM 10.7 (H) 12/08/2018 0851   CALCIUM 10.8 (H) 06/28/2017 0851   ALKPHOS 45 12/08/2018 0851   ALKPHOS 49 06/28/2017 0851   AST 27 12/08/2018 0851   AST 29 06/28/2017 0851   ALT 18 12/08/2018 0851   ALT 19 06/28/2017 0851   BILITOT 0.5 12/08/2018 0851   BILITOT 0.68 06/28/2017 0851       Impression and Plan:  68 year old woman with:  1.  Leukocytosis diagnosed in 2015.  Her differential showed monocytosis likely reactive in nature.  Her work-up is a been unrevealing for a lymphoproliferative disorder.     Laboratory data reviewed from today continues to show mild leukocytosis with slight increase in the absolute monocyte count.  The relative monocytosis about 25% which is relatively stable.  The differential diagnosis of these findings were reviewed again.  These are likely indicate reactive process rather than a  lymphoproliferative disorder or myeloproliferative disorder.  Myelodysplastic syndrome is also considered less likely at this time.  I recommended continued observation and surveillance and repeat laboratory testing periodically.  2. Thrombocytopenia: Platelet count today is close to normal range.  This appears to be immune mediated and fluctuating in nature.  No intervention is needed.  She is not at increased risk of bleeding at this time.  3.  Scheduled shoulder surgery: I see no contraindication from a hematology standpoint.  Her platelet count is adequate without any increased risk of bleeding.  4. Followup: In 6 to 8 months for repeat evaluation.  15  minutes was spent with the patient face-to-face today.  More than 50% of time was dedicated to reviewing laboratory data, discussing differential diagnosis and management options for  the future.   Zola Button, MD 9/23/20209:45 AM

## 2019-08-27 DIAGNOSIS — L570 Actinic keratosis: Secondary | ICD-10-CM | POA: Diagnosis not present

## 2019-08-27 DIAGNOSIS — L309 Dermatitis, unspecified: Secondary | ICD-10-CM | POA: Diagnosis not present

## 2019-08-31 NOTE — Progress Notes (Signed)
PCP - Billey Chang Cardiologist -   Chest x-ray -  EKG - 12-06-17 epic Stress Test -  ECHO -  Cardiac Cath -   Sleep Study -  CPAP -   Fasting Blood Sugar -  Checks Blood Sugar _____ times a day  Blood Thinner Instructions: Aspirin Instructions: Last Dose:  Anesthesia review:   Patient denies shortness of breath, fever, cough and chest pain at PAT appointment   Patient verbalized understanding of instructions that were given to them at the PAT appointment. Patient was also instructed that they will need to review over the PAT instructions again at home before surgery.

## 2019-08-31 NOTE — Patient Instructions (Signed)
DUE TO COVID-19 ONLY ONE VISITOR IS ALLOWED TO COME WITH YOU AND STAY IN THE WAITING ROOM ONLY DURING PRE OP AND PROCEDURE DAY OF SURGERY. THE 1 VISITOR MAY VISIT WITH YOU AFTER SURGERY IN YOUR PRIVATE ROOM DURING VISITING HOURS ONLY!  YOU NEED TO HAVE A COVID 19 TEST ON_______ @_______ , THIS TEST MUST BE DONE BEFORE SURGERY, COME  [place], [place] , [address].  ([place]) ONCE YOUR COVID TEST IS COMPLETED, PLEASE BEGIN THE QUARANTINE INSTRUCTIONS AS OUTLINED IN YOUR HANDOUT.                Morgan [name]  08/31/2019   Your procedure is scheduled on: 09-10-19   Report to [facility] Main  Entrance   Report to admitting at        0730  AM     Call this number if you have problems the morning of surgery [phone]    Remember: NO SOLID FOOD AFTER MIDNIGHT THE NIGHT PRIOR TO SURGERY. NOTHING BY MOUTH EXCEPT CLEAR LIQUIDS UNTIL     0700 am . PLEASE FINISH ENSURE DRINK PER SURGEON ORDER  WHICH NEEDS TO BE COMPLETED AT     0700 am then nothing by mouth .     CLEAR LIQUID DIET   Foods Allowed                                                                                         Foods Excluded  Coffee and tea, regular and decaf                                                      liquids that you cannot  Plain Jell-O any favor except red or purple                                       see through such as: Fruit ices (not with fruit pulp)                                                             milk, soups, orange juice  Iced Popsicles                                                             All solid food Carbonated beverages, regular and diet  Cranberry, grape and apple juices Sports drinks like Gatorade Lightly seasoned clear broth or consume(fat free) Sugar, honey syrup  _____________________________________________________________________     BRUSH YOUR TEETH MORNING OF SURGERY AND RINSE YOUR MOUTH  OUT, NO CHEWING GUM CANDY OR MINTS.     Take these medicines the morning of surgery with A SIP OF WATER: prednisone, allegra, doxepin, tylenol if needed                                 You may not have any metal on your body including hair pins and              piercings  Do not wear jewelry, make-up, lotions, powders or perfumes, deodorant             Do not wear nail polish on your fingernails.  Do not shave  48 hours prior to surgery.               Do not bring valuables to the hospital. [place].  Contacts, dentures or bridgework may not be worn into surgery.               Please read over the following fact sheets you were given: _____________________________________________________________________           [facility] - Preparing for Surgery Before surgery, you can play an important role.  Because skin is not sterile, your skin needs to be as free of germs as possible.  You can reduce the number of germs on your skin by washing with CHG (chlorahexidine gluconate) soap before surgery.  CHG is an antiseptic cleaner which kills germs and bonds with the skin to continue killing germs even after washing. Please DO NOT use if you have an allergy to CHG or antibacterial soaps.  If your skin becomes reddened/irritated stop using the CHG and inform your nurse when you arrive at Short Stay. Do not shave (including legs and underarms) for at least 48 hours prior to the first CHG shower.  You may shave your face/neck. Please follow these instructions carefully:  1.  Shower with CHG Soap the night before surgery and the  morning of Surgery.  2.  If you choose to wash your hair, wash your hair first as usual with your  normal  shampoo.  3.  After you shampoo, rinse your hair and body thoroughly to remove the  shampoo.                           4.  Use CHG as you would any other liquid soap.  You can apply chg directly  to the skin and wash                        Gently with a scrungie or clean washcloth.  5.  Apply the CHG Soap to your body ONLY FROM THE NECK DOWN.   Do not use on face/ open                           Wound or open sores. Avoid contact with eyes, ears mouth and genitals (private parts).  Wash face,  Genitals (private parts) with your normal soap.             6.  Wash thoroughly, paying special attention to the area where your surgery  will be performed.  7.  Thoroughly rinse your body with warm water from the neck down.  8.  DO NOT shower/wash with your normal soap after using and rinsing off  the CHG Soap.                9.  Pat yourself dry with a clean towel.            10.  Wear clean pajamas.            11.  Place clean sheets on your bed the night of your first shower and do not  sleep with pets. Day of Surgery : Do not apply any lotions/deodorants the morning of surgery.  Please wear clean clothes to the hospital/surgery center.  FAILURE TO FOLLOW THESE INSTRUCTIONS MAY RESULT IN THE CANCELLATION OF YOUR SURGERY PATIENT SIGNATURE_________________________________  NURSE SIGNATURE__________________________________  ________________________________________________________________________   Morgan [name]  An incentive spirometer is a tool that can help keep your lungs clear and active. This tool measures how well you are filling your lungs with each breath. Taking long deep breaths may help reverse or decrease the chance of developing breathing (pulmonary) problems (especially infection) following:  A long period of time when you are unable to move or be active. BEFORE THE PROCEDURE   If the spirometer includes an indicator to show your best effort, your nurse or respiratory therapist will set it to a desired goal.  If possible, sit up straight or lean slightly forward. Try not to slouch.  Hold the incentive spirometer in an upright position. INSTRUCTIONS FOR USE  1. Sit on the  edge of your bed if possible, or sit up as far as you can in bed or on a chair. 2. Hold the incentive spirometer in an upright position. 3. Breathe out normally. 4. Place the mouthpiece in your mouth and seal your lips tightly around it. 5. Breathe in slowly and as deeply as possible, raising the piston or the ball toward the top of the column. 6. Hold your breath for 3-5 seconds or for as long as possible. Allow the piston or ball to fall to the bottom of the column. 7. Remove the mouthpiece from your mouth and breathe out normally. 8. Rest for a few seconds and repeat Steps 1 through 7 at least 10 times every 1-2 hours when you are awake. Take your time and take a few normal breaths between deep breaths. 9. The spirometer may include an indicator to show your best effort. Use the indicator as a goal to work toward during each repetition. 10. After each set of 10 deep breaths, practice coughing to be sure your lungs are clear. If you have an incision (the cut made at the time of surgery), support your incision when coughing by placing a pillow or rolled up towels firmly against it. Once you are able to get out of bed, walk around indoors and cough well. You may stop using the incentive spirometer when instructed by your caregiver.  RISKS AND COMPLICATIONS  Take your time so you do not get dizzy or light-headed.  If you are in pain, you may need to take or ask for pain medication before doing incentive spirometry. It is harder to take a deep breath if you are having pain.  AFTER USE  Rest and breathe slowly and easily.  It can be helpful to keep track of a log of your progress. Your caregiver can provide you with a simple table to help with this. If you are using the spirometer at home, follow these instructions: [place] IF:   You are having difficultly using the spirometer.  You have trouble using the spirometer as often as instructed.  Your pain medication is not giving enough  relief while using the spirometer.  You develop fever of 100.5 F (38.1 C) or higher. SEEK IMMEDIATE MEDICAL CARE IF:   You cough up bloody sputum that had not been present before.  You develop fever of 102 F (38.9 C) or greater.  You develop worsening pain at or near the incision site. MAKE SURE YOU:   Understand these instructions.  Will watch your condition.  Will get help right away if you are not doing well or get worse. Document Released: 04/01/2007 Document Revised: 02/11/2012 Document Reviewed: 06/02/2007 [facility] Patient Information 2014 [place], [place].   ________________________________________________________________________

## 2019-09-01 ENCOUNTER — Encounter (HOSPITAL_COMMUNITY)
Admission: RE | Admit: 2019-09-01 | Discharge: 2019-09-01 | Disposition: A | Discharge status: Home / Self Care | Payer: BC Managed Care – PPO | Source: Ambulatory Visit | Attending: Orthopedic Surgery | Admitting: Orthopedic Surgery

## 2019-09-01 ENCOUNTER — Encounter (HOSPITAL_COMMUNITY): Payer: Self-pay

## 2019-09-01 ENCOUNTER — Other Ambulatory Visit: Payer: Self-pay

## 2019-09-01 DIAGNOSIS — Z01812 Encounter for preprocedural laboratory examination: Secondary | ICD-10-CM | POA: Insufficient documentation

## 2019-09-01 LAB — CBC
HCT: 41.1 % (ref 36.0–46.0)
Hemoglobin: 13.2 g/dL (ref 12.0–15.0)
MCH: 31.1 pg (ref 26.0–34.0)
MCHC: 32.1 g/dL (ref 30.0–36.0)
MCV: 96.9 fL (ref 80.0–100.0)
Platelets: 107 10*3/uL — ABNORMAL LOW (ref 150–400)
RBC: 4.24 MIL/uL (ref 3.87–5.11)
RDW: 15.5 % (ref 11.5–15.5)
WBC: 15 10*3/uL — ABNORMAL HIGH (ref 4.0–10.5)
nRBC: 0 % (ref 0.0–0.2)

## 2019-09-01 LAB — BASIC METABOLIC PANEL
Anion gap: 10 (ref 5–15)
BUN: 21 mg/dL (ref 8–23)
CO2: 26 mmol/L (ref 22–32)
Calcium: 9.7 mg/dL (ref 8.9–10.3)
Chloride: 106 mmol/L (ref 98–111)
Creatinine, Ser: 0.84 mg/dL (ref 0.44–1.00)
GFR calc Af Amer: >60 mL/min (ref >60)
GFR calc non Af Amer: >60 mL/min (ref >60)
Glucose, Bld: 91 mg/dL (ref 70–99)
Potassium: 4 mmol/L (ref 3.5–5.1)
Sodium: 142 mmol/L (ref 135–145)

## 2019-09-01 LAB — SURGICAL PCR SCREEN
MRSA, PCR: NEGATIVE
Staphylococcus aureus: NEGATIVE

## 2019-09-02 ENCOUNTER — Telehealth: Payer: Self-pay | Admitting: Oncology

## 2019-09-02 NOTE — Telephone Encounter (Signed)
Called and left msg. Mailed printout  °

## 2019-09-07 ENCOUNTER — Other Ambulatory Visit (HOSPITAL_COMMUNITY)
Admission: RE | Admit: 2019-09-07 | Discharge: 2019-09-07 | Disposition: A | Discharge status: Home / Self Care | Payer: Medicare Other | Source: Ambulatory Visit | Attending: Orthopedic Surgery | Admitting: Orthopedic Surgery

## 2019-09-07 DIAGNOSIS — Z20828 Contact with and (suspected) exposure to other viral communicable diseases: Secondary | ICD-10-CM | POA: Diagnosis not present

## 2019-09-07 DIAGNOSIS — Z01812 Encounter for preprocedural laboratory examination: Secondary | ICD-10-CM | POA: Insufficient documentation

## 2019-09-08 LAB — NOVEL CORONAVIRUS, NAA (HOSP ORDER, SEND-OUT TO REF LAB; TAT 18-24 HRS): SARS-CoV-2, NAA: NOT DETECTED

## 2019-09-09 NOTE — Progress Notes (Signed)
Pt. Aware of time change arrive at 0530 NPO at 0430 ensure drink completed .

## 2019-09-10 ENCOUNTER — Encounter (HOSPITAL_COMMUNITY): Payer: Self-pay

## 2019-09-10 ENCOUNTER — Inpatient Hospital Stay (HOSPITAL_COMMUNITY): Payer: BC Managed Care – PPO | Admitting: Physician Assistant

## 2019-09-10 ENCOUNTER — Encounter (HOSPITAL_COMMUNITY)
Admission: RE | Disposition: A | Discharge status: Home / Self Care | Payer: Self-pay | Source: Ambulatory Visit | Attending: Orthopedic Surgery

## 2019-09-10 ENCOUNTER — Other Ambulatory Visit: Payer: Self-pay

## 2019-09-10 ENCOUNTER — Inpatient Hospital Stay (HOSPITAL_COMMUNITY): Payer: BC Managed Care – PPO | Admitting: Certified Registered"

## 2019-09-10 ENCOUNTER — Inpatient Hospital Stay (HOSPITAL_COMMUNITY)
Admission: RE | Admit: 2019-09-10 | Discharge: 2019-09-11 | DRG: 483 | Disposition: A | Discharge status: Home / Self Care | Payer: BC Managed Care – PPO | Source: Ambulatory Visit | Attending: Orthopedic Surgery | Admitting: Orthopedic Surgery

## 2019-09-10 DIAGNOSIS — Z833 Family history of diabetes mellitus: Secondary | ICD-10-CM

## 2019-09-10 DIAGNOSIS — M75101 Unspecified rotator cuff tear or rupture of right shoulder, not specified as traumatic: Principal | ICD-10-CM | POA: Diagnosis present

## 2019-09-10 DIAGNOSIS — G8918 Other acute postprocedural pain: Secondary | ICD-10-CM | POA: Diagnosis not present

## 2019-09-10 DIAGNOSIS — Z85828 Personal history of other malignant neoplasm of skin: Secondary | ICD-10-CM

## 2019-09-10 DIAGNOSIS — H903 Sensorineural hearing loss, bilateral: Secondary | ICD-10-CM | POA: Diagnosis present

## 2019-09-10 DIAGNOSIS — Z7982 Long term (current) use of aspirin: Secondary | ICD-10-CM | POA: Diagnosis not present

## 2019-09-10 DIAGNOSIS — Z96611 Presence of right artificial shoulder joint: Secondary | ICD-10-CM

## 2019-09-10 DIAGNOSIS — M069 Rheumatoid arthritis, unspecified: Secondary | ICD-10-CM | POA: Diagnosis not present

## 2019-09-10 DIAGNOSIS — I10 Essential (primary) hypertension: Secondary | ICD-10-CM | POA: Diagnosis present

## 2019-09-10 DIAGNOSIS — M75121 Complete rotator cuff tear or rupture of right shoulder, not specified as traumatic: Secondary | ICD-10-CM | POA: Diagnosis not present

## 2019-09-10 DIAGNOSIS — Z803 Family history of malignant neoplasm of breast: Secondary | ICD-10-CM

## 2019-09-10 DIAGNOSIS — K760 Fatty (change of) liver, not elsewhere classified: Secondary | ICD-10-CM | POA: Diagnosis present

## 2019-09-10 DIAGNOSIS — Z8261 Family history of arthritis: Secondary | ICD-10-CM

## 2019-09-10 DIAGNOSIS — Z8249 Family history of ischemic heart disease and other diseases of the circulatory system: Secondary | ICD-10-CM | POA: Diagnosis not present

## 2019-09-10 DIAGNOSIS — Z79899 Other long term (current) drug therapy: Secondary | ICD-10-CM

## 2019-09-10 DIAGNOSIS — E785 Hyperlipidemia, unspecified: Secondary | ICD-10-CM | POA: Diagnosis not present

## 2019-09-10 DIAGNOSIS — K219 Gastro-esophageal reflux disease without esophagitis: Secondary | ICD-10-CM | POA: Diagnosis not present

## 2019-09-10 DIAGNOSIS — Z87891 Personal history of nicotine dependence: Secondary | ICD-10-CM

## 2019-09-10 DIAGNOSIS — K5730 Diverticulosis of large intestine without perforation or abscess without bleeding: Secondary | ICD-10-CM | POA: Diagnosis present

## 2019-09-10 DIAGNOSIS — E782 Mixed hyperlipidemia: Secondary | ICD-10-CM | POA: Diagnosis not present

## 2019-09-10 HISTORY — DX: Presence of right artificial shoulder joint: Z96.611

## 2019-09-10 HISTORY — PX: REVERSE SHOULDER ARTHROPLASTY: SHX5054

## 2019-09-10 SURGERY — ARTHROPLASTY, SHOULDER, TOTAL, REVERSE
Anesthesia: General | Site: Shoulder | Laterality: Right

## 2019-09-10 MED ORDER — FENTANYL CITRATE (PF) 100 MCG/2ML IJ SOLN
INTRAMUSCULAR | Status: DC | PRN
  Administered 2019-09-10: 25 ug via INTRAVENOUS
  Administered 2019-09-10: 50 ug via INTRAVENOUS

## 2019-09-10 MED ORDER — TRANEXAMIC ACID-NACL 1000-0.7 MG/100ML-% IV SOLN
1000.0000 mg | INTRAVENOUS | Status: AC
  Administered 2019-09-10: 1000 mg via INTRAVENOUS
  Filled 2019-09-10: qty 100

## 2019-09-10 MED ORDER — OXYCODONE HCL 5 MG PO TABS
10.0000 mg | ORAL_TABLET | ORAL | Status: DC | PRN
  Administered 2019-09-10 – 2019-09-11 (×3): 10 mg via ORAL
  Filled 2019-09-10 (×3): qty 2

## 2019-09-10 MED ORDER — LIDOCAINE 2% (20 MG/ML) 5 ML SYRINGE
INTRAMUSCULAR | Status: DC | PRN
  Administered 2019-09-10: 60 mg via INTRAVENOUS

## 2019-09-10 MED ORDER — ALUM & MAG HYDROXIDE-SIMETH 200-200-20 MG/5ML PO SUSP: 30.0000 mL | ORAL | Status: DC | PRN

## 2019-09-10 MED ORDER — PREDNISONE 5 MG PO TABS
5.0000 mg | ORAL_TABLET | Freq: Every day | ORAL | Status: DC
  Administered 2019-09-11: 5 mg via ORAL
  Filled 2019-09-10: qty 1

## 2019-09-10 MED ORDER — MIDAZOLAM HCL 2 MG/2ML IJ SOLN
INTRAMUSCULAR | Status: AC
  Filled 2019-09-10: qty 2

## 2019-09-10 MED ORDER — CEFAZOLIN SODIUM-DEXTROSE 2-4 GM/100ML-% IV SOLN
2.0000 g | INTRAVENOUS | Status: AC
  Administered 2019-09-10: 2 g via INTRAVENOUS
  Filled 2019-09-10: qty 100

## 2019-09-10 MED ORDER — DOXEPIN HCL 10 MG PO CAPS
10.0000 mg | ORAL_CAPSULE | Freq: Two times a day (BID) | ORAL | Status: DC
  Administered 2019-09-10 – 2019-09-11 (×2): 10 mg via ORAL
  Filled 2019-09-10 (×2): qty 1

## 2019-09-10 MED ORDER — ACETAMINOPHEN 325 MG PO TABS: 325.0000 mg | ORAL_TABLET | Freq: Four times a day (QID) | ORAL | Status: DC | PRN

## 2019-09-10 MED ORDER — PHENYLEPHRINE 40 MCG/ML (10ML) SYRINGE FOR IV PUSH (FOR BLOOD PRESSURE SUPPORT)
PREFILLED_SYRINGE | INTRAVENOUS | Status: AC
  Filled 2019-09-10: qty 10

## 2019-09-10 MED ORDER — ONDANSETRON HCL 4 MG/2ML IJ SOLN: 4.0000 mg | Freq: Four times a day (QID) | INTRAMUSCULAR | Status: DC | PRN

## 2019-09-10 MED ORDER — METHOCARBAMOL 500 MG PO TABS
500.0000 mg | ORAL_TABLET | Freq: Four times a day (QID) | ORAL | Status: DC | PRN
  Administered 2019-09-10 – 2019-09-11 (×2): 500 mg via ORAL
  Filled 2019-09-10 (×2): qty 1

## 2019-09-10 MED ORDER — OXYCODONE HCL 5 MG PO TABS: 5.0000 mg | ORAL_TABLET | Freq: Once | ORAL | Status: DC | PRN

## 2019-09-10 MED ORDER — FENTANYL CITRATE (PF) 100 MCG/2ML IJ SOLN: 25.0000 ug | INTRAMUSCULAR | Status: DC | PRN

## 2019-09-10 MED ORDER — BISACODYL 5 MG PO TBEC: 5.0000 mg | DELAYED_RELEASE_TABLET | Freq: Every day | ORAL | Status: DC | PRN

## 2019-09-10 MED ORDER — MENTHOL 3 MG MT LOZG: 1.0000 | LOZENGE | OROMUCOSAL | Status: DC | PRN

## 2019-09-10 MED ORDER — PHENYLEPHRINE 40 MCG/ML (10ML) SYRINGE FOR IV PUSH (FOR BLOOD PRESSURE SUPPORT)
PREFILLED_SYRINGE | INTRAVENOUS | Status: DC | PRN
  Administered 2019-09-10: 120 ug via INTRAVENOUS
  Administered 2019-09-10: 40 ug via INTRAVENOUS
  Administered 2019-09-10 (×2): 80 ug via INTRAVENOUS

## 2019-09-10 MED ORDER — SCOPOLAMINE 1 MG/3DAYS TD PT72
MEDICATED_PATCH | TRANSDERMAL | Status: AC
  Filled 2019-09-10: qty 1

## 2019-09-10 MED ORDER — BUPIVACAINE-EPINEPHRINE (PF) 0.5% -1:200000 IJ SOLN
INTRAMUSCULAR | Status: DC | PRN
  Administered 2019-09-10: 15 mL via PERINEURAL

## 2019-09-10 MED ORDER — HYDROXYCHLOROQUINE SULFATE 200 MG PO TABS
200.0000 mg | ORAL_TABLET | Freq: Every day | ORAL | Status: DC
  Administered 2019-09-10 – 2019-09-11 (×2): 200 mg via ORAL
  Filled 2019-09-10 (×3): qty 1

## 2019-09-10 MED ORDER — LACTATED RINGERS IV SOLN
INTRAVENOUS | Status: DC
  Administered 2019-09-10: 07:00:00 via INTRAVENOUS

## 2019-09-10 MED ORDER — METOCLOPRAMIDE HCL 5 MG PO TABS: 5.0000 mg | ORAL_TABLET | Freq: Three times a day (TID) | ORAL | Status: DC | PRN

## 2019-09-10 MED ORDER — MAGNESIUM CITRATE PO SOLN: 1.0000 | Freq: Once | ORAL | Status: DC | PRN

## 2019-09-10 MED ORDER — METHOCARBAMOL 500 MG IVPB - SIMPLE MED
500.0000 mg | Freq: Four times a day (QID) | INTRAVENOUS | Status: DC | PRN
  Filled 2019-09-10: qty 50

## 2019-09-10 MED ORDER — PROPOFOL 10 MG/ML IV BOLUS
INTRAVENOUS | Status: DC | PRN
  Administered 2019-09-10: 30 mg via INTRAVENOUS
  Administered 2019-09-10: 150 mg via INTRAVENOUS

## 2019-09-10 MED ORDER — LACTATED RINGERS IV SOLN
INTRAVENOUS | Status: DC
  Administered 2019-09-10: 12:00:00 via INTRAVENOUS

## 2019-09-10 MED ORDER — TEMAZEPAM 15 MG PO CAPS: 15.0000 mg | ORAL_CAPSULE | Freq: Every evening | ORAL | Status: DC | PRN

## 2019-09-10 MED ORDER — MIDAZOLAM HCL 2 MG/2ML IJ SOLN
INTRAMUSCULAR | Status: DC | PRN
  Administered 2019-09-10: 2 mg via INTRAVENOUS

## 2019-09-10 MED ORDER — FENTANYL CITRATE (PF) 100 MCG/2ML IJ SOLN
INTRAMUSCULAR | Status: AC
  Filled 2019-09-10: qty 2

## 2019-09-10 MED ORDER — DIPHENHYDRAMINE HCL 12.5 MG/5ML PO ELIX: 12.5000 mg | ORAL_SOLUTION | ORAL | Status: DC | PRN

## 2019-09-10 MED ORDER — DOCUSATE SODIUM 100 MG PO CAPS
100.0000 mg | ORAL_CAPSULE | Freq: Two times a day (BID) | ORAL | Status: DC
  Administered 2019-09-10 – 2019-09-11 (×3): 100 mg via ORAL
  Filled 2019-09-10 (×3): qty 1

## 2019-09-10 MED ORDER — OXYCODONE HCL 5 MG/5ML PO SOLN: 5.0000 mg | Freq: Once | ORAL | Status: DC | PRN

## 2019-09-10 MED ORDER — HYDROCHLOROTHIAZIDE 25 MG PO TABS
25.0000 mg | ORAL_TABLET | Freq: Every day | ORAL | Status: DC
  Administered 2019-09-10 – 2019-09-11 (×2): 25 mg via ORAL
  Filled 2019-09-10 (×2): qty 1

## 2019-09-10 MED ORDER — ASPIRIN 81 MG PO CHEW
81.0000 mg | CHEWABLE_TABLET | ORAL | Status: DC
  Administered 2019-09-11: 81 mg via ORAL
  Filled 2019-09-10: qty 1

## 2019-09-10 MED ORDER — METOCLOPRAMIDE HCL 5 MG/ML IJ SOLN: 5.0000 mg | Freq: Three times a day (TID) | INTRAMUSCULAR | Status: DC | PRN

## 2019-09-10 MED ORDER — LIDOCAINE 2% (20 MG/ML) 5 ML SYRINGE
INTRAMUSCULAR | Status: AC
  Filled 2019-09-10: qty 5

## 2019-09-10 MED ORDER — PANTOPRAZOLE SODIUM 40 MG PO TBEC
40.0000 mg | DELAYED_RELEASE_TABLET | Freq: Every day | ORAL | Status: DC
  Administered 2019-09-10 – 2019-09-11 (×2): 40 mg via ORAL
  Filled 2019-09-10 (×2): qty 1

## 2019-09-10 MED ORDER — SODIUM CHLORIDE 0.9 % IR SOLN
Status: DC | PRN
  Administered 2019-09-10: 1000 mL

## 2019-09-10 MED ORDER — ROCURONIUM BROMIDE 10 MG/ML (PF) SYRINGE
PREFILLED_SYRINGE | INTRAVENOUS | Status: AC
  Filled 2019-09-10: qty 10

## 2019-09-10 MED ORDER — HYDROMORPHONE HCL 1 MG/ML IJ SOLN: 0.5000 mg | INTRAMUSCULAR | Status: DC | PRN

## 2019-09-10 MED ORDER — DEXAMETHASONE SODIUM PHOSPHATE 10 MG/ML IJ SOLN
INTRAMUSCULAR | Status: DC | PRN
  Administered 2019-09-10: 8 mg via INTRAVENOUS

## 2019-09-10 MED ORDER — CHLORHEXIDINE GLUCONATE 4 % EX LIQD: 60.0000 mL | Freq: Once | CUTANEOUS | Status: DC

## 2019-09-10 MED ORDER — ROCURONIUM BROMIDE 10 MG/ML (PF) SYRINGE
PREFILLED_SYRINGE | INTRAVENOUS | Status: DC | PRN
  Administered 2019-09-10: 50 mg via INTRAVENOUS
  Administered 2019-09-10: 20 mg via INTRAVENOUS

## 2019-09-10 MED ORDER — ONDANSETRON HCL 4 MG PO TABS: 4.0000 mg | ORAL_TABLET | Freq: Four times a day (QID) | ORAL | Status: DC | PRN

## 2019-09-10 MED ORDER — BUPIVACAINE LIPOSOME 1.3 % IJ SUSP
INTRAMUSCULAR | Status: DC | PRN
  Administered 2019-09-10: 10 mL via PERINEURAL

## 2019-09-10 MED ORDER — POLYETHYLENE GLYCOL 3350 17 G PO PACK: 17.0000 g | PACK | Freq: Every day | ORAL | Status: DC | PRN

## 2019-09-10 MED ORDER — SUGAMMADEX SODIUM 200 MG/2ML IV SOLN
INTRAVENOUS | Status: DC | PRN
  Administered 2019-09-10: 200 mg via INTRAVENOUS

## 2019-09-10 MED ORDER — PROPOFOL 10 MG/ML IV BOLUS
INTRAVENOUS | Status: AC
  Filled 2019-09-10: qty 20

## 2019-09-10 MED ORDER — OXYCODONE HCL 5 MG PO TABS
5.0000 mg | ORAL_TABLET | ORAL | Status: DC | PRN
  Administered 2019-09-10 – 2019-09-11 (×2): 5 mg via ORAL
  Filled 2019-09-10 (×2): qty 1

## 2019-09-10 MED ORDER — PHENYLEPHRINE HCL (PRESSORS) 10 MG/ML IV SOLN
INTRAVENOUS | Status: AC
  Filled 2019-09-10: qty 1

## 2019-09-10 MED ORDER — SCOPOLAMINE 1 MG/3DAYS TD PT72
MEDICATED_PATCH | TRANSDERMAL | Status: DC | PRN
  Administered 2019-09-10: 1 via TRANSDERMAL

## 2019-09-10 MED ORDER — ONDANSETRON HCL 4 MG/2ML IJ SOLN
INTRAMUSCULAR | Status: DC | PRN
  Administered 2019-09-10: 4 mg via INTRAVENOUS

## 2019-09-10 MED ORDER — PHENOL 1.4 % MT LIQD: 1.0000 | OROMUCOSAL | Status: DC | PRN

## 2019-09-10 SURGICAL SUPPLY — 73 items
BAG ZIPLOCK 12X15 (MISCELLANEOUS) ×2 IMPLANT
BLADE SAW SGTL 83.5X18.5 (BLADE) ×2 IMPLANT
COOLER ICEMAN CLASSIC (MISCELLANEOUS) IMPLANT
COVER BACK TABLE 60X90IN (DRAPES) ×2 IMPLANT
COVER MAYO STAND STRL (DRAPES) ×1 IMPLANT
COVER SURGICAL LIGHT HANDLE (MISCELLANEOUS) ×2 IMPLANT
COVER WAND RF STERILE (DRAPES) IMPLANT
CUP SUT UNIV REVERS 36+2 RT (Cup) ×1 IMPLANT
DERMABOND ADVANCED (GAUZE/BANDAGES/DRESSINGS) ×1
DERMABOND ADVANCED .7 DNX12 (GAUZE/BANDAGES/DRESSINGS) ×1 IMPLANT
DRAPE INCISE IOBAN 66X45 STRL (DRAPES) IMPLANT
DRAPE ORTHO SPLIT 77X108 STRL (DRAPES) ×2
DRAPE SHEET LG 3/4 BI-LAMINATE (DRAPES) ×2 IMPLANT
DRAPE SURG 17X11 SM STRL (DRAPES) ×2 IMPLANT
DRAPE SURG ORHT 6 SPLT 77X108 (DRAPES) ×2 IMPLANT
DRAPE U-SHAPE 47X51 STRL (DRAPES) ×2 IMPLANT
DRESSING AQUACEL AG SP 3.5X10 (GAUZE/BANDAGES/DRESSINGS) IMPLANT
DRSG AQUACEL AG ADV 3.5X10 (GAUZE/BANDAGES/DRESSINGS) ×2 IMPLANT
DRSG AQUACEL AG SP 3.5X10 (GAUZE/BANDAGES/DRESSINGS) ×2
DURAPREP 26ML APPLICATOR (WOUND CARE) ×2 IMPLANT
ELECT BLADE TIP CTD 4 INCH (ELECTRODE) ×2 IMPLANT
ELECT REM PT RETURN 15FT ADLT (MISCELLANEOUS) ×2 IMPLANT
FACESHIELD WRAPAROUND (MASK) ×8 IMPLANT
FACESHIELD WRAPAROUND OR TEAM (MASK) ×4 IMPLANT
GLENOID UNI REV MOD 24 +2 LAT (Joint) ×1 IMPLANT
GLENOSPHERE 36 +4 LAT/24 (Joint) ×1 IMPLANT
GLOVE BIO SURGEON STRL SZ7.5 (GLOVE) ×2 IMPLANT
GLOVE BIO SURGEON STRL SZ8 (GLOVE) ×2 IMPLANT
GLOVE SS BIOGEL STRL SZ 7 (GLOVE) ×1 IMPLANT
GLOVE SS BIOGEL STRL SZ 7.5 (GLOVE) ×1 IMPLANT
GLOVE SUPERSENSE BIOGEL SZ 7 (GLOVE) ×1
GLOVE SUPERSENSE BIOGEL SZ 7.5 (GLOVE) ×1
GLOVE SURG SYN 7.0 (GLOVE) IMPLANT
GLOVE SURG SYN 7.0 PF PI (GLOVE) IMPLANT
GLOVE SURG SYN 7.5  E (GLOVE)
GLOVE SURG SYN 7.5 E (GLOVE) IMPLANT
GLOVE SURG SYN 7.5 PF PI (GLOVE) IMPLANT
GLOVE SURG SYN 8.0 (GLOVE) IMPLANT
GLOVE SURG SYN 8.0 PF PI (GLOVE) IMPLANT
GOWN STRL REUS W/TWL LRG LVL3 (GOWN DISPOSABLE) ×4 IMPLANT
INSERT HUMERAL 36 +6 (Shoulder) ×1 IMPLANT
KIT BASIN OR (CUSTOM PROCEDURE TRAY) ×2 IMPLANT
KIT TURNOVER KIT A (KITS) IMPLANT
MANIFOLD NEPTUNE II (INSTRUMENTS) ×2 IMPLANT
NDL TAPERED W/ NITINOL LOOP (MISCELLANEOUS) ×1 IMPLANT
NEEDLE TAPERED W/ NITINOL LOOP (MISCELLANEOUS) ×2 IMPLANT
NS IRRIG 1000ML POUR BTL (IV SOLUTION) ×2 IMPLANT
PACK SHOULDER (CUSTOM PROCEDURE TRAY) ×2 IMPLANT
PAD ARMBOARD 7.5X6 YLW CONV (MISCELLANEOUS) ×2 IMPLANT
PAD COLD SHLDR WRAP-ON (PAD) ×1 IMPLANT
PIN SET MODULAR GLENOID SYSTEM (PIN) ×1 IMPLANT
RESTRAINT HEAD UNIVERSAL NS (MISCELLANEOUS) ×2 IMPLANT
SCREW CENTRAL MODULAR 25 (Screw) ×1 IMPLANT
SCREW PERI LOCK 5.5X16 (Screw) ×2 IMPLANT
SCREW PERI LOCK 5.5X24 (Screw) ×1 IMPLANT
SCREW PERIPHERAL 5.5X28 LOCK (Screw) ×1 IMPLANT
SLING ARM FOAM STRAP LRG (SOFTGOODS) ×1 IMPLANT
SLING ARM FOAM STRAP MED (SOFTGOODS) IMPLANT
SPONGE LAP 18X18 RF (DISPOSABLE) IMPLANT
STEM HUMERAL UNI REVERS SZ9 (Stem) ×1 IMPLANT
SUCTION FRAZIER HANDLE 12FR (TUBING) ×1
SUCTION TUBE FRAZIER 12FR DISP (TUBING) ×1 IMPLANT
SUT FIBERWIRE #2 38 T-5 BLUE (SUTURE) ×2
SUT MNCRL AB 3-0 PS2 18 (SUTURE) ×2 IMPLANT
SUT MON AB 2-0 CT1 36 (SUTURE) ×2 IMPLANT
SUT VIC AB 1 CT1 36 (SUTURE) ×2 IMPLANT
SUTURE FIBERWR #2 38 T-5 BLUE (SUTURE) IMPLANT
SUTURE TAPE 1.3 40 TPR END (SUTURE) ×2 IMPLANT
SUTURETAPE 1.3 40 TPR END (SUTURE) ×6
TOWEL OR 17X26 10 PK STRL BLUE (TOWEL DISPOSABLE) ×2 IMPLANT
TOWEL OR NON WOVEN STRL DISP B (DISPOSABLE) ×2 IMPLANT
WATER STERILE IRR 1000ML POUR (IV SOLUTION) ×4 IMPLANT
YANKAUER SUCT BULB TIP 10FT TU (MISCELLANEOUS) ×2 IMPLANT

## 2019-09-10 NOTE — Anesthesia Procedure Notes (Signed)
Procedure Name: Intubation Date/Time: 09/10/2019 7:41 AM Performed by: Niel Hummer, CRNA Pre-anesthesia Checklist: Patient identified, Emergency Drugs available, Suction available and Patient being monitored Patient Re-evaluated:Patient Re-evaluated prior to induction Oxygen Delivery Method: Circle system utilized Preoxygenation: Pre-oxygenation with 100% oxygen Induction Type: IV induction Ventilation: Mask ventilation without difficulty Laryngoscope Size: Mac and 4 Grade View: Grade II Tube type: Oral Tube size: 7.5 mm Number of attempts: 1 Airway Equipment and Method: Stylet Placement Confirmation: ETT inserted through vocal cords under direct vision,  positive ETCO2 and breath sounds checked- equal and bilateral Secured at: 22 cm Tube secured with: Tape Dental Injury: Teeth and Oropharynx as per pre-operative assessment

## 2019-09-10 NOTE — Discharge Instructions (Signed)
° °Kevin M. Supple, M.D., F.A.A.O.S. °Orthopaedic Surgery °Specializing in Arthroscopic and Reconstructive °Surgery of the Shoulder °[phone] °[address] - [place], [place] [address] - Fax [phone] ° ° °POST-OP TOTAL SHOULDER REPLACEMENT INSTRUCTIONS ° °1. Call the office at [phone] to schedule your first post-op appointment 10-14 days from the date of your surgery. ° °2. The bandage over your incision is waterproof. You may begin showering with this dressing on. You may leave this dressing on until first follow up appointment within 2 weeks. We prefer you leave this dressing in place until follow up however after 5-7 days if you are having itching or skin irritation and would like to remove it you may do so. Go slow and tug at the borders gently to break the bond the dressing has with the skin. At this point if there is no drainage it is okay to go without a bandage or you may cover it with a light guaze and tape. You can also expect significant bruising around your shoulder that will drift down your arm and into your chest wall. This is very normal and should resolve over several days. ° ° 3. Wear your sling/immobilizer at all times except to perform the exercises below or to occasionally let your arm dangle by your side to stretch your elbow. You also need to sleep in your sling immobilizer until instructed otherwise. It is ok to remove your sling if you are sitting in a controlled environment and allow your arm to rest in a position of comfort by your side or on your lap with pillows to give your neck and skin a break from the sling. You may remove it to allow arm to dangle by side to shower. If you are up walking around and when you go to sleep at night you need to wear it. ° °4. Range of motion to your elbow, wrist, and hand are encouraged 3-5 times daily. Exercise to your hand and fingers helps to reduce swelling you may experience. ° °5. Utilize ice to the shoulder 3-5 times  minimum a day and additionally if you are experiencing pain. ° °6. Prescriptions for a pain medication and a muscle relaxant are provided for you. It is recommended that if you are experiencing pain that you pain medication alone is not controlling, add the muscle relaxant along with the pain medication which can give additional pain relief. The first 1-2 days is generally the most severe of your pain and then should gradually decrease. As your pain lessens it is recommended that you decrease your use of the pain medications to an "as needed basis'" only and to always comply with the recommended dosages of the pain medications. ° °7. Pain medications can produce constipation along with their use. If you experience this, the use of an over the counter stool softener or laxative daily is recommended.  ° °8. For additional questions or concerns, please do not hesitate to call the office. If after hours there is an answering service to forward your concerns to the physician on call. ° °9.Pain control following an exparel block ° °To help control your post-operative pain you received a nerve block  performed with Exparel which is a long acting anesthetic (numbing agent) which can provide pain relief and sensations of numbness (and relief of pain) in the operative shoulder and arm for up to 3 days. Sometimes it provides mixed relief, meaning you may still have numbness in certain areas of the arm but can still   be able to move  parts of that arm, hand, and fingers. We recommend that your prescribed pain medications  be used as needed. We do not feel it is necessary to "pre medicate" and "stay ahead" of pain.  Taking narcotic pain medications when you are not having any pain can lead to unnecessary and potentially dangerous side effects.   ° °10. Use the ice machine as much as possible in the first 5-7 days from surgery, then you can wean its use to as needed. The ice typically needs to be replaced every 6 hours, instead of  ice you can actually freeze water bottles to put in the cooler and then fill water around them to avoid having to purchase ice. You can have spare water bottles freezing to allow you to rotate them once they have melted. Try to have a thin shirt or light cloth or towel under the ice wrap to protect your skin. ° °POST-OP EXERCISES ° °Pendulum Exercises ° °Perform pendulum exercises while standing and bending at the waist. Support your uninvolved arm on a table or chair and allow your operated arm to hang freely. Make sure to do these exercises passively - not using you shoulder muscles. ° °Repeat 20 times. Do 3 sessions per day. ° ° ° ° °

## 2019-09-10 NOTE — Op Note (Signed)
09/10/2019  9:31 AM  PATIENT:   Morgan [name]  68 y.o. female  PRE-OPERATIVE DIAGNOSIS:  right shoulder chronic rotator cuff tear  POST-OPERATIVE DIAGNOSIS: Same  PROCEDURE: Right shoulder reverse arthroplasty utilizing a size 9 press-fit Arthrex stem with a posterior offset metaphysis, a +6 polyethylene insert, a 36/+4 glenosphere on a small/+2 baseplate  SURGEON:  Deaundre Allston, Morgan Clines M.D.  ASSISTANTS: Jenetta Loges, PA-C  ANESTHESIA:   General endotracheal and interscalene block with Exparel  EBL: 150 cc  SPECIMEN: None  Drains: None   PATIENT DISPOSITION:  PACU - hemodynamically stable.    PLAN OF [name]: Admit for overnight observation  Brief history:  Morgan [name] has been followed for chronic right shoulder pain with a recent acute exacerbation after lifting episode with subsequent profound loss of shoulder mobility and abruptly increased pain.  Recent MRIs confirms a massive and retracted tear of the rotator cuff musculature with significant atrophy and this combined with clinical findings suggests an acute on chronic rotator cuff tear.  She does have underlying chronic rheumatologic disease.  Due to her increasing pain and functional rotations she is brought to the operating this time for planned right shoulder reverse arthroplasty  Preoperatively I did counseled Morgan [name] regarding treatment options as well as the potential risks versus benefits thereof.  Possible surgical complications were reviewed including bleeding, infection, neurovascular injury, persistent pain, loss of motion, anesthetic complication, failure the implant, and possibly for additional surgery.  She understands, and accepts and agrees with our planned procedure.  Procedure in detail:  After undergoing routine preop evaluation patient received prophylactic antibiotics and interscalene block with Exparel was established in the holding area by the anesthesia department.  Placed supine on the operating table  and underwent the smooth induction of a general endotracheal anesthesia.  Placed into the beachchair position and appropriately padded and protected.  The right shoulder girdle region was sterilely prepped and draped in standard fashion.  Timeout was called.  An anterior deltopectoral approach to the right shoulder is made through a 10 cm incision.  Skin flaps are elevated dissection carried deeply the deltopectoral interval was then developed from proximal to distal with the vein taken laterally.  Upper centimeter the pectoralis major was tenotomized for enhanced exposure and the conjoined tendon was mobilized and retracted medially.  The biceps tendon was then unroofed and tenodesed at the upper border of the pectoralis major tendon the proximal segment was then excised and the subscapularis was then elevated from the lesser tuberosity with a margin tagged with a pair of suture tape sutures.  Capsular attachments were then divided in a subperiosteal fashion from the anterior and infra margins of the humeral neck and humeral head was then delivered through the wound.  Extra medullary guide was then used to outline the proposed humeral head resection which was then performed with an oscillating saw at approximate 20 degrees of retroversion.  Metal cap placed over the cut proximal humeral surface and the glenoid was then exposed with appropriate retractors and a circumferential labral resection was performed gaining complete visualization of the periphery of the glenoid.  A guide was then used to place a guidepin into the center of the glenoid with an approximately 10 degree inferior tilt and the glenoid was then reamed with both the central and peripheral reamers obtaining a stable bony bed for our baseplate.  The central drill hole was then prepared and tapped and our final baseplate with a 25 mm lag screw was then inserted  with excellent fit fixation.  All of the peripheral locking screws were then placed and then  a 36/+4 glenosphere was impacted onto the baseplate and a central locking screw was then placed.  At this point we then returned our attention to the proximal humerus where the canal was hand reamed and then ultimately broached up to a size 9.  The posterior offset reaming guide was then used to prepare the metaphysis and our trial was then placed and this showed good soft tissue balance.  At this point our trial was removed our final implant was then assembled and was then impacted into the humerus with excellent fit fixation.  Trial reductions at this point showed excellent soft tissue balance with a +6 polyethylene insert.  A final +6 poly-was then impacted onto the baseplate final reduction was then performed and we are very pleased with the overall stability and soft tissue balance.  We then mobilized the subscapularis and showed good elasticity so is repaired back to the metaphysis using the eyelets on the collar of our implant.  This point the arm easily achieved 30 degrees of external rotation without excessive tension of the subscapularis.  The wound was then once again copiously irrigated.  Hemostasis was obtained.  The deltopectoral interval was then reapproximated with a series of figure-of-eight and 1 Vicryl sutures.  2-0 Vicryl used for the subcu layer and intracuticular 3-0 Monocryl for the skin followed by Dermabond and Aquasol dressing right arm placed in a sling and the patient was awakened, extubated, and taken recovery in stable addition.  Jenetta Loges, PA-C was used as an Environmental consultant throughout this case essential for help with positioning the patient, positioning extremity, tissue manipulation, implantation of the prosthesis, wound closure, and intraoperative decision making.  Morgan Clines Malisha Mabey MD   Contact # [phone]

## 2019-09-10 NOTE — Anesthesia Postprocedure Evaluation (Signed)
Anesthesia Post Note  Patient: Morgan [name]  Procedure(s) Performed: REVERSE SHOULDER ARTHROPLASTY (Right Shoulder)     Patient location during evaluation: PACU Anesthesia Type: General Level of consciousness: awake and alert Pain management: pain level controlled Vital Signs Assessment: post-procedure vital signs reviewed and stable Respiratory status: spontaneous breathing, nonlabored ventilation, respiratory function stable and patient connected to nasal cannula oxygen Cardiovascular status: blood pressure returned to baseline and stable Postop Assessment: no apparent nausea or vomiting Anesthetic complications: no    Last Vitals:  Vitals:   09/10/19 1030 09/10/19 1054  BP: (!) 154/62 (!) 150/68  Pulse: 63 63  Resp: 15 16  Temp: 36.6 C   SpO2: 96% 100%    Last Pain:  Vitals:   09/10/19 1030  TempSrc:   PainSc: 0-No pain                 Xander Jutras S

## 2019-09-10 NOTE — H&P (Signed)
[name]    Chief Complaint: right shoulder chronic rotator cuff tear HPI: The patient is a 68 y.o. female with chronic right shoulder pain and restricted mobility related to massive, retracted, irreparable rotator cuff tear.  Past Medical History:  Diagnosis Date  . Abnormal chest x-ray   . Abnormal electrocardiogram   . Abscess of sigmoid colon due to diverticulitis 11/04/2017  . Allergic rhinitis   . Allergy   . Cancer ([place])    Skin non melanoma  . Cataract    forming   . Colon polyp    polypoid colorectal mucosa/adenomatous  . Complication of anesthesia    and cries  . Contact dermatitis due to poison ivy   . Cystitis   . Diverticulosis of colon   . Fatty liver disease, nonalcoholic   . GERD (gastroesophageal reflux disease)    occasional  . Hearing loss   . Hemorrhoids   . Hyperlipidemia   . Hypertension   . Lymphocytosis   . Overweight(278.02)   . Pneumonia    2017  . PONV (postoperative nausea and vomiting)   . RA (rheumatoid arthritis) (HCC)    Rheumatoid  . Venous insufficiency     Past Surgical History:  Procedure Laterality Date  . CESAREAN SECTION     [phone]  . chemical and laser endovenous ablation of LE veins  2008, 2009, 2010, 2011   Dr Eilleen Kempf et al  . COLONOSCOPY    . FOOT SURGERY     left  . INNER EAR SURGERY Right    childhood; right, ear drum repair  . LAPAROSCOPIC LYSIS OF ADHESIONS N/A 12/11/2017   Procedure: LAPAROSCOPIC LYSIS OF ADHESIONS;  Surgeon: Michael Boston, MD;  Location: [facility];  Service: General;  Laterality: N/A;  . NASAL SEPTUM SURGERY    . POLYPECTOMY    . PROCTOSCOPY N/A 12/11/2017   Procedure: RIDGED PROCTOSCOPY;  Surgeon: Michael Boston, MD;  Location: [facility];  Service: General;  Laterality: N/A;  . TOTAL ABDOMINAL HYSTERECTOMY  12/86  . TYMPANOPLASTY Right 11/03/2018   Procedure: TYMPANOPLASTY;  Surgeon: Melida Quitter, MD;  Location: [place];  Service: ENT;  Laterality: Right;    Family  History  Problem Relation Age of Onset  . Hyperlipidemia Mother   . Hypertension Mother   . COPD Mother   . Arthritis Mother   . Heart disease Mother   . Atrial fibrillation Mother   . Diabetes Sister   . Healthy Sister   . Heart disease Sister        sister #1  . Breast cancer Other        half-sister  . Liver disease Sister        sister #1 - hx of liver transplant 14+ yrs ago  . Diabetes Maternal Uncle   . Heart failure Brother   . Diabetes Maternal Aunt        x 2  . Heart failure Son   . Colon cancer Neg Hx   . Colon polyps Neg Hx   . Esophageal cancer Neg Hx   . Rectal cancer Neg Hx   . Stomach cancer Neg Hx     Social History:  reports that she quit smoking about 17 years ago. Her smoking use included cigarettes. She has a 7.50 pack-year smoking history. She has never used smokeless tobacco. She reports current alcohol use. She reports that she does not use drugs.   Medications Prior to Admission  Medication Sig Dispense Refill  .  Ascorbic Acid (VITAMIN C) 1000 MG tablet Take 1,000 mg by mouth daily.    Marland Kitchen aspirin 81 MG tablet Take 81 mg by mouth 3 (three) times a week.     . Calcium Carbonate-Vitamin D 600-400 MG-UNIT tablet Take 1 tablet by mouth 2 (two) times daily.     . Cholecalciferol (VITAMIN D) 50 MCG (2000 UT) CAPS Take 2,000 Units by mouth daily.     Marland Kitchen doxepin (SINEQUAN) 10 MG capsule Take 10 mg by mouth 2 (two) times daily.     . fenofibrate 160 MG tablet Take 1 tablet (160 mg total) by mouth at bedtime. 90 tablet 0  . fexofenadine (ALLEGRA) 180 MG tablet Take 180 mg by mouth daily.    . fluticasone (FLONASE) 50 MCG/ACT nasal spray Place 2 sprays into both nostrils daily. (Patient taking differently: Place 2 sprays into both nostrils daily as needed for allergies. ) 16 g 6  . hydrochlorothiazide (HYDRODIURIL) 25 MG tablet TAKE 1 TABLET BY MOUTH EVERY DAY 90 tablet 3  . hydroxychloroquine (PLAQUENIL) 200 MG tablet Take 200 mg by mouth daily.     .  Hypromellose (ARTIFICIAL TEARS OP) Place 1 drop into both eyes daily as needed (for dry eyes).    . Multiple Vitamin (MULTIVITAMIN) tablet Take 1 tablet by mouth daily.      . ondansetron (ZOFRAN) 4 MG tablet Take 1 tablet (4 mg total) by mouth every 6 (six) hours as needed for nausea. 20 tablet 0  . predniSONE (DELTASONE) 5 MG tablet Take 5 mg by mouth daily.  3  . promethazine (PHENERGAN) 12.5 MG tablet     . simvastatin (ZOCOR) 40 MG tablet TAKE ONE TABLET (40 MG TOTAL) BY MOUTH AT BEDTIME. (Patient taking differently: Take 40 mg by mouth every evening. ) 90 tablet 3  . acetaminophen (TYLENOL) 500 MG tablet Take 500 mg by mouth every 6 (six) hours as needed for moderate pain or headache.     . Golimumab (SIMPONI ) Inject into the skin.        Physical Exam: Right shoulder demonstrates profoundly restricted mobility with global weakness and severe pain as noted at recent office visits.  Preoperative MRI scan confirms a massive, retracted tear of the rotator cuff with associated atrophy and changes consistent with a rotator cuff tear arthropathy.  Vitals  Temp:  [99.1 F (37.3 C)] 99.1 F (37.3 C) (10/08 0606) Pulse Rate:  [70] 70 (10/08 0606) Resp:  [18] 18 (10/08 0606) BP: (132)/(59) 132/59 (10/08 0606) SpO2:  [97 %] 97 % (10/08 0606) Weight:  [95.7 kg] 95.7 kg (10/08 [id number])  Assessment/Plan  Impression: right shoulder chronic rotator cuff tear  Plan of Action: Procedure(s): REVERSE SHOULDER ARTHROPLASTY  Keyler Hoge M Pama Roskos 09/10/2019, 6:44 AM Contact # [phone]

## 2019-09-10 NOTE — Transfer of Care (Signed)
Immediate Anesthesia Transfer of Care Note  Patient: TIERNY LABRANCHE  Procedure(s) Performed: REVERSE SHOULDER ARTHROPLASTY (Right Shoulder)  Patient Location: PACU  Anesthesia Type:General  Level of Consciousness: awake, alert  and oriented  Airway & Oxygen Therapy: Patient Spontanous Breathing and Patient connected to face mask oxygen  Post-op Assessment: Report given to RN and Post -op Vital signs reviewed and stable  Post vital signs: Reviewed and stable  Last Vitals:  Vitals Value Taken Time  BP 129/65 09/10/19 0940  Temp    Pulse 65 09/10/19 0941  Resp 17 09/10/19 0941  SpO2 100 % 09/10/19 0941  Vitals shown include unvalidated device data.  Last Pain:  Vitals:   09/10/19 0633  TempSrc:   PainSc: 0-No pain      Patients Stated Pain Goal: 3 ([id number])  Complications: No apparent anesthesia complications

## 2019-09-10 NOTE — Anesthesia Preprocedure Evaluation (Signed)
Anesthesia Evaluation  Patient identified by MRN, date of birth, ID band Patient awake    Reviewed: Allergy & Precautions, H&P , NPO status , Patient's Chart, lab work & pertinent test results  History of Anesthesia Complications (+) PONV and history of anesthetic complications  Airway Mallampati: II   Neck ROM: full    Dental   Pulmonary former smoker,    breath sounds clear to auscultation       Cardiovascular hypertension,  Rhythm:regular Rate:Normal     Neuro/Psych    GI/Hepatic GERD  ,  Endo/Other    Renal/GU      Musculoskeletal  (+) Arthritis , Rheumatoid disorders,    Abdominal   Peds  Hematology   Anesthesia Other Findings   Reproductive/Obstetrics                             Anesthesia Physical Anesthesia Plan  ASA: II  Anesthesia Plan: General   Post-op Pain Management:  Regional for Post-op pain   Induction: Intravenous  PONV Risk Score and Plan: 4 or greater and Ondansetron, Dexamethasone, Scopolamine patch - Pre-op and Treatment may vary due to age or medical condition  Airway Management Planned: Oral ETT  Additional Equipment:   Intra-op Plan:   Post-operative Plan: Extubation in OR  Informed Consent: I have reviewed the patients History and Physical, chart, labs and discussed the procedure including the risks, benefits and alternatives for the proposed anesthesia with the patient or authorized representative who has indicated his/her understanding and acceptance.       Plan Discussed with: CRNA, Anesthesiologist and Surgeon  Anesthesia Plan Comments:         Anesthesia Quick Evaluation

## 2019-09-10 NOTE — Anesthesia Procedure Notes (Signed)
Anesthesia Regional Block: Interscalene brachial plexus block   Pre-Anesthetic Checklist: ,, timeout performed, Correct Patient, Correct Site, Correct Laterality, Correct Procedure, Correct Position, site marked, Risks and benefits discussed,  Surgical consent,  Pre-op evaluation,  At surgeon's request and post-op pain management  Laterality: Right  Prep: chloraprep       Needles:  Injection technique: Single-shot  Needle Type: Echogenic Stimulator Needle     Needle Length: 5cm  Needle Gauge: 22     Additional Needles:   Procedures:, nerve stimulator,,,,,,,   Nerve Stimulator or Paresthesia:  Response: biceps flexion, 0.45 mA,   Additional Responses:   Narrative:  Start time: 09/10/2019 7:02 AM End time: 09/10/2019 7:09 AM Injection made incrementally with aspirations every 5 mL.  Performed by: Personally  Anesthesiologist: Albertha Ghee, MD  Additional Notes: Functioning IV was confirmed and monitors were applied.  A 16mm 22ga Arrow echogenic stimulator needle was used. Sterile prep and drape,hand hygiene and sterile gloves were used.  Negative aspiration and negative test dose prior to incremental administration of local anesthetic. The patient tolerated the procedure well.  Ultrasound guidance: relevent anatomy identified, needle position confirmed, local anesthetic spread visualized around nerve(s), vascular puncture avoided.  Image printed for medical record.

## 2019-09-11 ENCOUNTER — Encounter (HOSPITAL_COMMUNITY): Payer: Self-pay | Admitting: Orthopedic Surgery

## 2019-09-11 MED ORDER — OXYCODONE-ACETAMINOPHEN 5-325 MG PO TABS: 1.0000 | ORAL_TABLET | ORAL | 0 refills | Status: DC | PRN

## 2019-09-11 MED ORDER — METHOCARBAMOL 500 MG PO TABS: 500.0000 mg | ORAL_TABLET | Freq: Three times a day (TID) | ORAL | 1 refills | Status: DC | PRN

## 2019-09-11 NOTE — Discharge Summary (Signed)
PATIENT ID:      Morgan [name]  MRN:     [id number] DOB/AGE:    70-22-1952 / 67 y.o.     DISCHARGE SUMMARY  ADMISSION DATE:    09/10/2019 DISCHARGE DATE:    ADMISSION DIAGNOSIS: right shoulder chronic rotator cuff tear Past Medical History:  Diagnosis Date  . Abnormal chest x-ray   . Abnormal electrocardiogram   . Abscess of sigmoid colon due to diverticulitis 11/04/2017  . Allergic rhinitis   . Allergy   . Cancer ([place])    Skin non melanoma  . Cataract    forming   . Colon polyp    polypoid colorectal mucosa/adenomatous  . Complication of anesthesia    and cries  . Contact dermatitis due to poison ivy   . Cystitis   . Diverticulosis of colon   . Fatty liver disease, nonalcoholic   . GERD (gastroesophageal reflux disease)    occasional  . Hearing loss   . Hemorrhoids   . Hyperlipidemia   . Hypertension   . Lymphocytosis   . Overweight(278.02)   . Pneumonia    2017  . PONV (postoperative nausea and vomiting)   . RA (rheumatoid arthritis) (HCC)    Rheumatoid  . Venous insufficiency     DISCHARGE DIAGNOSIS:   Active Problems:   S/P reverse total shoulder arthroplasty, right   PROCEDURE: Procedure(s): REVERSE SHOULDER ARTHROPLASTY on 09/10/2019  CONSULTS:    HISTORY:  See H&P in chart.  HOSPITAL COURSE:  Morgan [name] is a 68 y.o. admitted on 09/10/2019 with a diagnosis of right shoulder chronic rotator cuff tear.  They were brought to the operating room on 09/10/2019 and underwent Procedure(s): [place].    They were given perioperative antibiotics:  Anti-infectives (From admission, onward)   Start     Dose/Rate Route Frequency Ordered Stop   09/10/19 1400  hydroxychloroquine (PLAQUENIL) tablet 200 mg     200 mg Oral Daily 09/10/19 1115     09/10/19 0600  ceFAZolin (ANCEF) IVPB 2g/100 mL premix     2 g 200 mL/hr over 30 Minutes Intravenous On call to O.R. 09/10/19 [id number] 09/10/19 [id number]    .  Patient underwent the above named procedure  and tolerated it well. The following day they were hemodynamically stable and pain was controlled on oral analgesics. They were neurovascularly intact to the operative extremity. OT was ordered and worked with patient per protocol. They were medically and orthopaedically stable for discharge on .    DIAGNOSTIC STUDIES:  RECENT RADIOGRAPHIC STUDIES :  No results found.  RECENT VITAL SIGNS:   Patient Vitals for the past 24 hrs:  BP Temp Temp src Pulse Resp SpO2  09/11/19 0503 (!) 128/54 99.1 F (37.3 C) Oral 75 18 96 %  09/11/19 0101 (!) 133/52 99.2 F (37.3 C) Oral 77 18 98 %  09/10/19 2110 (!) 141/63 98.3 F (36.8 C) Oral 72 18 99 %  09/10/19 2107 (!) 141/63 98.3 F (36.8 C) Oral 72 - 99 %  09/10/19 1431 134/63 - - 70 14 100 %  09/10/19 1328 (!) 137/55 98 F (36.7 C) Oral 70 16 99 %  09/10/19 1230 138/69 97.8 F (36.6 C) Oral 74 16 96 %  09/10/19 1054 (!) 150/68 - - 63 16 100 %  09/10/19 1030 (!) 154/62 97.9 F (36.6 C) - 63 15 96 %  09/10/19 1015 (!) 144/69 - - 65 16 97 %  09/10/19 1000 130/66 - - 63 16  100 %  09/10/19 0945 (!) 144/62 97.7 F (36.5 C) - 65 16 100 %  .  RECENT EKG RESULTS:    Orders placed or performed during the hospital encounter of 12/06/17  . EKG 12 lead  . EKG 12 lead    DISCHARGE INSTRUCTIONS:    DISCHARGE MEDICATIONS:   Allergies as of 09/11/2019      Reactions   Remicade [infliximab] Swelling, Other (See Comments)   Facial swelling, throat was closing up.       Medication List    TAKE these medications   acetaminophen 500 MG tablet Commonly known as: TYLENOL Take 500 mg by mouth every 6 (six) hours as needed for moderate pain or headache.   ARTIFICIAL TEARS OP Place 1 drop into both eyes daily as needed (for dry eyes).   aspirin 81 MG tablet Take 81 mg by mouth 3 (three) times a week.   Calcium Carbonate-Vitamin D 600-400 MG-UNIT tablet Take 1 tablet by mouth 2 (two) times daily.   doxepin 10 MG capsule Commonly known as:  SINEQUAN Take 10 mg by mouth 2 (two) times daily.   fenofibrate 160 MG tablet Take 1 tablet (160 mg total) by mouth at bedtime.   fexofenadine 180 MG tablet Commonly known as: ALLEGRA Take 180 mg by mouth daily.   fluticasone 50 MCG/ACT nasal spray Commonly known as: FLONASE Place 2 sprays into both nostrils daily. What changed:   when to take this  reasons to take this   hydrochlorothiazide 25 MG tablet Commonly known as: HYDRODIURIL TAKE 1 TABLET BY MOUTH EVERY DAY   hydroxychloroquine 200 MG tablet Commonly known as: PLAQUENIL Take 200 mg by mouth daily.   methocarbamol 500 MG tablet Commonly known as: Robaxin Take 1 tablet (500 mg total) by mouth 3 (three) times daily as needed for muscle spasms.   multivitamin tablet Take 1 tablet by mouth daily.   ondansetron 4 MG tablet Commonly known as: ZOFRAN Take 1 tablet (4 mg total) by mouth every 6 (six) hours as needed for nausea.   oxyCODONE-acetaminophen 5-325 MG tablet Commonly known as: Percocet Take 1 tablet by mouth every 4 (four) hours as needed (max 6 q).   predniSONE 5 MG tablet Commonly known as: DELTASONE Take 5 mg by mouth daily.   promethazine 12.5 MG tablet Commonly known as: PHENERGAN   SIMPONI [place] Inject into the skin.   simvastatin 40 MG tablet Commonly known as: ZOCOR TAKE ONE TABLET (40 MG TOTAL) BY MOUTH AT BEDTIME. What changed: See the new instructions.   vitamin C 1000 MG tablet Take 1,000 mg by mouth daily.   Vitamin D 50 MCG (2000 UT) Caps Take 2,000 Units by mouth daily.       FOLLOW UP VISIT:   Follow-up Information    Justice Britain, MD.   Specialty: Orthopedic Surgery Why: call to be seen in 10 -14 days Contact information: [address] [place] [address] [id number]           DISCHARGE TO: Home   DISCHARGE CONDITION:  [place] for Dr. Lennette Bihari Supple 09/11/2019, 9:00 AM

## 2019-09-11 NOTE — Plan of Care (Signed)
  Problem: Education: Goal: Knowledge of the prescribed therapeutic regimen will improve Outcome: Adequate for Discharge Goal: Understanding of activity limitations/precautions following surgery will improve Outcome: Adequate for Discharge Goal: Individualized Educational Video(s) Outcome: Adequate for Discharge   Problem: Activity: Goal: Ability to tolerate increased activity will improve Outcome: Adequate for Discharge   Problem: Pain Management: Goal: Pain level will decrease with appropriate interventions Outcome: Adequate for Discharge   Problem: Education: Goal: Knowledge of General Education information will improve Description: Including pain rating scale, medication(s)/side effects and non-pharmacologic comfort measures Outcome: Adequate for Discharge   Problem: Health Behavior/Discharge Planning: Goal: Ability to manage health-related needs will improve Outcome: Adequate for Discharge   Problem: Clinical Measurements: Goal: Ability to maintain clinical measurements within normal limits will improve Outcome: Adequate for Discharge Goal: Will remain free from infection Outcome: Adequate for Discharge Goal: Diagnostic test results will improve Outcome: Adequate for Discharge Goal: Respiratory complications will improve Outcome: Adequate for Discharge Goal: Cardiovascular complication will be avoided Outcome: Adequate for Discharge   Problem: Activity: Goal: Risk for activity intolerance will decrease Outcome: Adequate for Discharge   Problem: Nutrition: Goal: Adequate nutrition will be maintained Outcome: Adequate for Discharge   Problem: Coping: Goal: Level of anxiety will decrease Outcome: Adequate for Discharge   Problem: Elimination: Goal: Will not experience complications related to bowel motility Outcome: Adequate for Discharge Goal: Will not experience complications related to urinary retention Outcome: Adequate for Discharge   Problem: Pain  Managment: Goal: General experience of comfort will improve Outcome: Adequate for Discharge   Problem: Safety: Goal: Ability to remain free from injury will improve Outcome: Adequate for Discharge   Problem: Skin Integrity: Goal: Risk for impaired skin integrity will decrease Outcome: Adequate for Discharge   

## 2019-09-11 NOTE — Evaluation (Signed)
Occupational Therapy Evaluation Patient Details Name: Morgan [name] MRN: [id number] DOB: 31-Jan-1951 Today's Date: 09/11/2019    History of Present Illness  68 year old female is s/p R RTSA   Clinical Impression   Pt was admitted for the above.  Husband present at end of session and reviewed all education. Both verbalize understanding of all.  Pt will follow up with Dr Morgan [name] for further rehab    Follow Up Recommendations  Follow surgeon's recommendation for DC plan and follow-up therapies    Equipment Recommendations  None recommended by OT    Recommendations for Other Services       Precautions / Restrictions Precautions Precautions: Shoulder Type of Shoulder Precautions: may be out of sling in controlled environment; may use arm for adls within the following parameters p/AA/AROM 20 ER 45 ABD and 60 FF.  may do pendulums and lap slides.  May move elbow wrist and fingers Restrictions Weight Bearing Restrictions: Yes RUE Weight Bearing: Non weight bearing      Mobility Bed Mobility               General bed mobility comments: min A for OOB  Transfers                 General transfer comment: supervision for sit to stand. pt states she walked to bathroom earlier without difficulty    Balance                                           ADL either performed or assessed with clinical judgement   ADL Overall ADL's : Needs assistance/impaired Eating/Feeding: Independent   Grooming: Min guard   Upper Body Bathing: Moderate assistance   Lower Body Bathing: Moderate assistance   Upper Body Dressing : Moderate assistance   Lower Body Dressing: Maximal assistance                 General ADL Comments: performed adl and exercises. Husband came at end of session and donned sling. Reviewed all education with him     Vision         Perception     Praxis      Pertinent Vitals/Pain Pain Assessment: No/denies pain     Hand  Dominance     Extremity/Trunk Assessment Upper Extremity Assessment Upper Extremity Assessment: RUE deficits/detail RUE Deficits / Details: able to move wrist and fingers.  starting to move elbow.l  pt is R dominant; LUE wfls           Communication Communication Communication: No difficulties   Cognition Arousal/Alertness: Awake/alert Behavior During Therapy: WFL for tasks assessed/performed Overall Cognitive Status: Within Functional Limits for tasks assessed                                     General Comments       Exercises     Shoulder Instructions      Home Living Family/patient expects to be discharged to:: Private residence Living Arrangements: Spouse/significant other Available Help at Discharge: Family Type of Home: House             Bathroom Shower/Tub: Teaching laboratory technician Toilet: Handicapped height     Home Equipment: Shower seat          Prior Functioning/Environment Level  of Independence: Independent                 OT Problem List:        OT Treatment/Interventions:      OT Goals(Current goals can be found in the care plan section) Acute Rehab OT Goals OT Goal Formulation: All assessment and education complete, DC therapy  OT Frequency:     Barriers to D/C:            Co-evaluation              AM-PAC OT "6 Clicks" Daily Activity     Outcome Measure Help from another person eating meals?: A Little Help from another person taking care of personal grooming?: A Little Help from another person toileting, which includes using toliet, bedpan, or urinal?: A Little Help from another person bathing (including washing, rinsing, drying)?: A Lot Help from another person to put on and taking off regular upper body clothing?: A Lot Help from another person to put on and taking off regular lower body clothing?: A Lot 6 Click Score: 15   End of Session    Activity Tolerance: Patient tolerated treatment  well Patient left: in bed;with call bell/phone within reach;with family/visitor present  OT Visit Diagnosis: Muscle weakness (generalized) (M62.81)                Time: [id number] OT Time Calculation (min): 46 min Charges:  OT General Charges $OT Visit: 1 Visit OT Evaluation $OT Eval Low Complexity: 1 Low OT Treatments $Self Care/Home Management : 23-37 mins  Morgan [name], OTR/L Acute Rehabilitation Services [phone] [facility] pager [phone] office 09/11/2019  Morgan [name] 09/11/2019, 10:55 AM

## 2019-09-11 NOTE — Progress Notes (Signed)
Discharge paperwork discussed with pt and husband at the bedside.  They demonstrated understanding. Pt to be escorted by wheelchair to main lobby.

## 2019-09-21 DIAGNOSIS — Z471 Aftercare following joint replacement surgery: Secondary | ICD-10-CM | POA: Diagnosis not present

## 2019-09-21 DIAGNOSIS — Z96611 Presence of right artificial shoulder joint: Secondary | ICD-10-CM | POA: Diagnosis not present

## 2019-09-24 DIAGNOSIS — Z79899 Other long term (current) drug therapy: Secondary | ICD-10-CM | POA: Diagnosis not present

## 2019-09-24 DIAGNOSIS — M0609 Rheumatoid arthritis without rheumatoid factor, multiple sites: Secondary | ICD-10-CM | POA: Diagnosis not present

## 2019-09-29 DIAGNOSIS — M25511 Pain in right shoulder: Secondary | ICD-10-CM | POA: Diagnosis not present

## 2019-09-29 DIAGNOSIS — M25611 Stiffness of right shoulder, not elsewhere classified: Secondary | ICD-10-CM | POA: Diagnosis not present

## 2019-09-30 ENCOUNTER — Encounter: Payer: Self-pay | Admitting: Oncology

## 2019-10-01 ENCOUNTER — Encounter: Payer: Self-pay | Admitting: Family Medicine

## 2019-10-01 ENCOUNTER — Other Ambulatory Visit: Payer: Self-pay

## 2019-10-01 ENCOUNTER — Ambulatory Visit (INDEPENDENT_AMBULATORY_CARE_PROVIDER_SITE_OTHER): Payer: BC Managed Care – PPO | Admitting: Family Medicine

## 2019-10-01 VITALS — BP 136/68 | HR 74 | Temp 97.7°F | Ht 64.0 in | Wt 209.2 lb

## 2019-10-01 DIAGNOSIS — R3915 Urgency of urination: Secondary | ICD-10-CM

## 2019-10-01 DIAGNOSIS — Z23 Encounter for immunization: Secondary | ICD-10-CM | POA: Diagnosis not present

## 2019-10-01 DIAGNOSIS — M25611 Stiffness of right shoulder, not elsewhere classified: Secondary | ICD-10-CM | POA: Diagnosis not present

## 2019-10-01 DIAGNOSIS — M25511 Pain in right shoulder: Secondary | ICD-10-CM | POA: Diagnosis not present

## 2019-10-01 LAB — POC URINALSYSI DIPSTICK (AUTOMATED)
Bilirubin, UA: NEGATIVE
Blood, UA: POSITIVE
Glucose, UA: NEGATIVE
Ketones, UA: NEGATIVE
Nitrite, UA: NEGATIVE
Protein, UA: NEGATIVE
Spec Grav, UA: 1.01 (ref 1.010–1.025)
Urobilinogen, UA: 0.2 E.U./dL
pH, UA: 7 (ref 5.0–8.0)

## 2019-10-01 MED ORDER — CEPHALEXIN 500 MG PO CAPS: 500.0000 mg | ORAL_CAPSULE | Freq: Two times a day (BID) | ORAL | 0 refills | Status: AC

## 2019-10-01 NOTE — Progress Notes (Signed)
   Chief Complaint:  Morgan [name] is a 68 y.o. female who presents today with a chief complaint of dysuria.   Assessment/Plan:  Dysuria UA and history consistent with UTI.  Will start empiric Keflex 500 mg twice daily for 7 days.  Check urine culture.  Encouraged good oral hydration.  Discussed reasons to return to care.  Follow-up as needed.    Subjective:  HPI:  Dysuria Started a few days ago.  Worsening.  Associated with urgency.  No treatments tried.  Feels a lot of prior UTIs.  No fevers or chills.  No nausea or vomiting.  No abdominal pain.  No back pain.  No other obvious aggravating or alleviating factors.  ROS: Per HPI  PMH: She reports that she quit smoking about 17 years ago. Her smoking use included cigarettes. She has a 7.50 pack-year smoking history. She has never used smokeless tobacco. She reports current alcohol use. She reports that she does not use drugs.      Objective:  Physical Exam: BP 136/68   Pulse 74   Temp 97.7 F (36.5 C)   Ht 5\' 4"  (1.626 m)   Wt 209 lb 3.2 oz (94.9 kg)   SpO2 97%   BMI 35.91 kg/m   Gen: NAD, resting comfortably CV: Regular rate and rhythm with no murmurs appreciated Pulm: Normal work of breathing, clear to auscultation bilaterally with no crackles, wheezes, or rhonchi GI: Normal bowel sounds present. Soft, Nontender, Nondistended. MSK: No edema, cyanosis, or clubbing noted.  No CVA tenderness. Skin: Warm, dry Neuro: Grossly normal, moves all extremities Psych: Normal affect and thought content  Results for orders placed or performed in visit on 10/01/19 (from the past 24 hour(s))  POCT Urinalysis Dipstick (Automated)     Status: Abnormal   Collection Time: 10/01/19  2:39 PM  Result Value Ref Range   Color, UA Yellow    Clarity, UA Clear    Glucose, UA Negative Negative   Bilirubin, UA Negative    Ketones, UA Negative    Spec Grav, UA 1.010 1.010 - 1.025   Blood, UA Positive    pH, UA 7.0 5.0 - 8.0   Protein, UA  Negative Negative   Urobilinogen, UA 0.2 0.2 or 1.0 E.U./dL   Nitrite, UA Negative    Leukocytes, UA Small (1+) (A) Negative        Caleb M. Jerline Pain, MD 10/01/2019 2:47 PM

## 2019-10-01 NOTE — Addendum Note (Signed)
Addended by: Loralyn Freshwater on: 10/01/2019 02:54 PM   Modules accepted: Orders

## 2019-10-01 NOTE — Patient Instructions (Signed)
It was nice to see you!  You have a urinary tract infection. Please start the antibiotic.  We will check a urine culture to make sure you do not have a resistant bacteria. We will call you if we need to change your medications.   Please make sure you are drinking plenty of fluids over the next few days.  If your symptoms do not improve over the next 5-7 days, or if they worsen, please let us know. Please also let us know if you have worsening back pain, fevers, chills, or body aches.   Take care, Dr Parker  

## 2019-10-03 LAB — URINE CULTURE
MICRO NUMBER:: 1045093
SPECIMEN QUALITY:: ADEQUATE

## 2019-10-05 NOTE — Progress Notes (Signed)
Please inform patient of the following:  Urine culture is negative. Would like for patient to finish her antibiotics and let us know if symptoms persist.  Algis Greenhouse. Jerline Pain, MD 10/05/2019 10:45 AM

## 2019-10-06 DIAGNOSIS — M25511 Pain in right shoulder: Secondary | ICD-10-CM | POA: Diagnosis not present

## 2019-10-06 DIAGNOSIS — M25611 Stiffness of right shoulder, not elsewhere classified: Secondary | ICD-10-CM | POA: Diagnosis not present

## 2019-10-07 NOTE — Telephone Encounter (Signed)
Copied from [place] [id number]. Topic: General - Other >> Oct 07, 2019 12:25 PM Leward Quan A wrote: Reason for CRM: Patient returned call to [facility] she can be reached at Ph#  [phone]

## 2019-10-08 DIAGNOSIS — L309 Dermatitis, unspecified: Secondary | ICD-10-CM | POA: Diagnosis not present

## 2019-10-09 DIAGNOSIS — M25611 Stiffness of right shoulder, not elsewhere classified: Secondary | ICD-10-CM | POA: Diagnosis not present

## 2019-10-09 DIAGNOSIS — M25511 Pain in right shoulder: Secondary | ICD-10-CM | POA: Diagnosis not present

## 2019-10-13 DIAGNOSIS — M25511 Pain in right shoulder: Secondary | ICD-10-CM | POA: Diagnosis not present

## 2019-10-13 DIAGNOSIS — M25611 Stiffness of right shoulder, not elsewhere classified: Secondary | ICD-10-CM | POA: Diagnosis not present

## 2019-10-15 DIAGNOSIS — M25611 Stiffness of right shoulder, not elsewhere classified: Secondary | ICD-10-CM | POA: Diagnosis not present

## 2019-10-15 DIAGNOSIS — M25511 Pain in right shoulder: Secondary | ICD-10-CM | POA: Diagnosis not present

## 2019-10-19 DIAGNOSIS — M25511 Pain in right shoulder: Secondary | ICD-10-CM | POA: Diagnosis not present

## 2019-10-19 DIAGNOSIS — M25611 Stiffness of right shoulder, not elsewhere classified: Secondary | ICD-10-CM | POA: Diagnosis not present

## 2019-10-21 DIAGNOSIS — M25511 Pain in right shoulder: Secondary | ICD-10-CM | POA: Diagnosis not present

## 2019-10-21 DIAGNOSIS — M25611 Stiffness of right shoulder, not elsewhere classified: Secondary | ICD-10-CM | POA: Diagnosis not present

## 2019-10-26 DIAGNOSIS — M25611 Stiffness of right shoulder, not elsewhere classified: Secondary | ICD-10-CM | POA: Diagnosis not present

## 2019-10-26 DIAGNOSIS — M25511 Pain in right shoulder: Secondary | ICD-10-CM | POA: Diagnosis not present

## 2019-10-28 DIAGNOSIS — Z79899 Other long term (current) drug therapy: Secondary | ICD-10-CM | POA: Diagnosis not present

## 2019-10-28 DIAGNOSIS — M0609 Rheumatoid arthritis without rheumatoid factor, multiple sites: Secondary | ICD-10-CM | POA: Diagnosis not present

## 2019-10-28 DIAGNOSIS — M2550 Pain in unspecified joint: Secondary | ICD-10-CM | POA: Diagnosis not present

## 2019-10-28 DIAGNOSIS — L409 Psoriasis, unspecified: Secondary | ICD-10-CM | POA: Diagnosis not present

## 2019-10-28 DIAGNOSIS — M25611 Stiffness of right shoulder, not elsewhere classified: Secondary | ICD-10-CM | POA: Diagnosis not present

## 2019-10-28 DIAGNOSIS — M25511 Pain in right shoulder: Secondary | ICD-10-CM | POA: Diagnosis not present

## 2019-11-02 DIAGNOSIS — M25611 Stiffness of right shoulder, not elsewhere classified: Secondary | ICD-10-CM | POA: Diagnosis not present

## 2019-11-02 DIAGNOSIS — M25511 Pain in right shoulder: Secondary | ICD-10-CM | POA: Diagnosis not present

## 2019-11-04 ENCOUNTER — Telehealth: Payer: Self-pay

## 2019-11-04 ENCOUNTER — Ambulatory Visit (INDEPENDENT_AMBULATORY_CARE_PROVIDER_SITE_OTHER): Payer: BC Managed Care – PPO | Admitting: Family Medicine

## 2019-11-04 ENCOUNTER — Encounter: Payer: Self-pay | Admitting: Family Medicine

## 2019-11-04 ENCOUNTER — Other Ambulatory Visit: Payer: Self-pay

## 2019-11-04 ENCOUNTER — Ambulatory Visit: Payer: Self-pay | Admitting: *Deleted

## 2019-11-04 VITALS — BP 110/66 | HR 68 | Temp 97.9°F | Ht 64.0 in | Wt 216.0 lb

## 2019-11-04 DIAGNOSIS — L03116 Cellulitis of left lower limb: Secondary | ICD-10-CM | POA: Diagnosis not present

## 2019-11-04 DIAGNOSIS — I83023 Varicose veins of left lower extremity with ulcer of ankle: Secondary | ICD-10-CM

## 2019-11-04 DIAGNOSIS — L97329 Non-pressure chronic ulcer of left ankle with unspecified severity: Secondary | ICD-10-CM

## 2019-11-04 LAB — CBC WITH DIFFERENTIAL/PLATELET
Basophils Absolute: 0 10*3/uL (ref 0.0–0.1)
Basophils Relative: 0.2 % (ref 0.0–3.0)
Eosinophils Absolute: 0.1 10*3/uL (ref 0.0–0.7)
Eosinophils Relative: 0.3 % (ref 0.0–5.0)
HCT: 36.8 % (ref 36.0–46.0)
Hemoglobin: 12.1 g/dL (ref 12.0–15.0)
Lymphocytes Relative: 9.8 % — ABNORMAL LOW (ref 12.0–46.0)
Lymphs Abs: 1.9 10*3/uL (ref 0.7–4.0)
MCHC: 33 g/dL (ref 30.0–36.0)
MCV: 91.9 fl (ref 78.0–100.0)
Monocytes Absolute: 5.7 10*3/uL — ABNORMAL HIGH (ref 0.1–1.0)
Monocytes Relative: 29.3 % — ABNORMAL HIGH (ref 3.0–12.0)
Neutro Abs: 11.6 10*3/uL — ABNORMAL HIGH (ref 1.4–7.7)
Neutrophils Relative %: 60.4 % (ref 43.0–77.0)
Platelets: 91 10*3/uL — ABNORMAL LOW (ref 150.0–400.0)
RBC: 4 Mil/uL (ref 3.87–5.11)
RDW: 16.6 % — ABNORMAL HIGH (ref 11.5–15.5)
WBC: 19.3 10*3/uL (ref 4.0–10.5)

## 2019-11-04 LAB — URIC ACID: Uric Acid, Serum: 4.8 mg/dL (ref 2.4–7.0)

## 2019-11-04 MED ORDER — CEPHALEXIN 250 MG PO CAPS: 250.0000 mg | ORAL_CAPSULE | Freq: Four times a day (QID) | ORAL | 0 refills | Status: DC

## 2019-11-04 NOTE — Progress Notes (Signed)
Patient: Morgan [name] MRN: [id number] DOB: Dec 20, 1950 PCP: [name], MD     Subjective:  Chief Complaint  Patient presents with  . pain in L foot x 24 hrs    area is very red and warm and tender to touch.  Hurts worse to walk on it.    HPI: The patient is a 68 y.o. female who presents today for pain in her left foot/ankle area on medial side. She states she had a red spot that she noticed yesterday afternoon. Redness has gotten worse and it hurts. She also feels like it is swollen. She states it has gotten a little bit better. Pain rated as a 3-4. Tender to walk on and tender to touch. She is on steroids daily for her RA (5mg  daily). She does feel like it hurts more when she stands for a while. She has been immobile for a while due to shoulder surgery. She has been trying to walk a lot for the last 6 weeks, but has been less active than normal. She denies any claudication. She is not a smoker. She does have hyperlipidemia. She has never had an ulcer before and has never had gout before. Denies injury//trauma to area.   No lipodermatosclerosis.   Review of Systems  Eyes: Negative for visual disturbance.  Respiratory: Negative for shortness of breath.   Cardiovascular: Positive for leg swelling. Negative for chest pain and palpitations.  Gastrointestinal: Negative for abdominal pain and nausea.  Skin:       Red, swollen and tender to touch area on L side of foot/ankle  Neurological: Negative for dizziness and headaches.    Allergies Patient is allergic to remicade [infliximab].  Past Medical History Patient  has a past medical history of Abnormal chest x-ray, Abnormal electrocardiogram, Abscess of sigmoid colon due to diverticulitis (11/04/2017), Allergic rhinitis, Allergy, Cancer ([place]), Cataract, Colon polyp, Complication of anesthesia, Contact dermatitis due to poison ivy, Cystitis, Diverticulosis of colon, Fatty liver disease, nonalcoholic, GERD (gastroesophageal reflux  disease), Hearing loss, Hemorrhoids, Hyperlipidemia, Hypertension, Lymphocytosis, Overweight(278.02), Pneumonia, PONV (postoperative nausea and vomiting), RA (rheumatoid arthritis) ([place]), and Venous insufficiency.  Surgical History Patient  has a past surgical history that includes Total abdominal hysterectomy (12/86); Inner ear surgery (Right); Nasal septum surgery; Foot surgery; chemical and laser endovenous ablation of LE veins (2008, 2009, 2010, 2011); Cesarean section; Proctoscopy (N/A, 12/11/2017); Laparoscopic lysis of adhesions (N/A, 12/11/2017); Tympanoplasty (Right, 11/03/2018); Colonoscopy; Polypectomy; and Reverse shoulder arthroplasty (Right, 09/10/2019).  Family History Pateint's family history includes Arthritis in her mother; Atrial fibrillation in her mother; Breast cancer in an other family member; COPD in her mother; Diabetes in her maternal aunt, maternal uncle, and sister; Healthy in her sister; Heart disease in her mother and sister; Heart failure in her brother and son; Hyperlipidemia in her mother; Hypertension in her mother; Liver disease in her sister.  Social History Patient  reports that she quit smoking about 17 years ago. Her smoking use included cigarettes. She has a 7.50 pack-year smoking history. She has never used smokeless tobacco. She reports current alcohol use. She reports that she does not use drugs.    Objective: Vitals:   11/04/19 1142  BP: 110/66  Pulse: 68  Temp: 97.9 F (36.6 C)  TempSrc: Skin  SpO2: 97%  Weight: 216 lb (98 kg)  Height: 5\' 4"  (1.626 m)    Body mass index is 37.08 kg/m.  Physical Exam Vitals signs reviewed.  Constitutional:      Appearance: Normal appearance.  She is obese.  HENT:     Head: Normocephalic and atraumatic.  Pulmonary:     Effort: Pulmonary effort is normal.  Musculoskeletal:     Comments: No swelling, erythema, tenderness in calf.   Skin:    Findings: Erythema (left medial lower leg, cirucular area of erythema  just superior to malelous. darker erythemaoutous circle that is about 2cm in diamter with surrounding erythema nad telangiectasias. tender and warm to touch. NOT on joint. she has varicose veins on lower le) present.  Neurological:     General: No focal deficit present.     Mental Status: She is alert and oriented to person, place, and time.        Assessment/plan: 1. Venous ulcer of ankle, left (HCC) Cellulitis vs. Beginning of venous ulcer. Clinically looks like beginning of ulcer. Treating for cellulitis as well and checking labs. Do not think gout since above joint, but will check uric acid level. Can continue with nsaids for pain. Also recommended conservative treatment with leg elevation, compression hose and exercise and weight loss. Precautions given.  - CBC with Differential/Platelet - Uric acid  2. Cellulitis of left leg  -7 day course of keflex. No concern for MRSA. If not getting better, she is to let us know by Friday.     This visit occurred during the SARS-CoV-2 public health emergency.  Safety protocols were in place, including screening questions prior to the visit, additional usage of staff PPE, and extensive cleaning of exam room while observing appropriate contact time as indicated for disinfecting solutions.     Return if symptoms worsen or fail to improve.   Orma Flaming, MD [place]   11/04/2019

## 2019-11-04 NOTE — Telephone Encounter (Signed)
Already addressed with patient.  Morgan Flaming, MD [place]

## 2019-11-04 NOTE — Patient Instructions (Signed)
Checking basic labs -putting you on antibiotic as well.   Venous Ulcer A venous ulcer is a shallow sore on your lower leg. Venous ulcer is the most common type of lower leg ulcer. You may have venous ulcers on one leg or on both legs. This condition most often develops around your ankles. This type of ulcer may last for a long time (chronic ulcer) or it may return often (recurrent ulcer). What are the causes? This condition is caused by poor blood flow in your legs. The poor flow causes blood to pool in your legs. This can break the skin, causing an ulcer. What increases the risk? You are more likely to develop this condition if:  You are 70 years of age or older.  You are female.  You are overweight.  You are not active.  You have had a leg ulcer in the past.  You have varicose veins.  You have clots in your lower leg veins (deep vein thrombosis).  You have inflammation of your leg veins (phlebitis).  You have recently been pregnant.  You smoke. What are the signs or symptoms? The main symptom of this condition is an open sore near your ankle. Other symptoms may include:  Swelling.  Thick skin.  Fluid coming from the ulcer.  Bleeding.  Itching.  Pain and swelling. This gets worse when you stand up and feels better when you raise your leg.  Blotchy skin.  Dark skin. How is this treated? This condition may be treated by:  Keeping your leg raised (elevated).  Wearing a type of bandage or stocking to keep pressure (compression) on the veins of your leg.  Taking medicines, including antibiotic medicines.  Cleaning your ulcer and removing any dead tissue from the wound.  Using bandages and wraps that have medicines in them to cover your ulcer.  Closing the wound using a piece of skin taken from another area of your body (graft). Follow these instructions at home: Medicines  Take or apply over-the-counter and prescription medicines only as told by your  doctor.  If you were prescribed an antibiotic medicine, take it as told by your doctor. Do not stop using the antibiotic even if you start to feel better.  Ask your doctor if you should take aspirin before long trips. Wound care  Follow instructions from your doctor about how to take care of your wound. Make sure you: ? Wash your hands with soap and water before and after you change your bandage (dressing). If you cannot use soap and water, use hand sanitizer. ? Change your bandage as told by your doctor. ? If you had a skin graft, leave stitches (sutures) in place. These may need to stay in place for 2 weeks or longer. ? Ask when you should remove your bandage. If your bandage is dry and sticks to your leg when you try to remove it, moisten or wet the bandage with saline solution or water to make it easier to remove.  Once your bandage is off, check your wound each day for signs of infection. Have a caregiver do this for you if you are not able to do it yourself. Check for: ? More redness, swelling, or pain. ? More fluid or blood. ? Warmth. ? Pus or a bad smell. Activity  Do not sit for a long time without moving. Get up to take short walks every 1-2 hours. This is important. Ask for help if you feel weak or unsteady.  Ask your doctor  what level of activity is safe for you.  Rest with your legs raised during the day. If you can, keep your legs above the level of your heart for 30 minutes, 3-4 times a day, or as told by your doctor.  Do not sit with your legs crossed. General instructions   Wear elastic stockings, compression stockings, or support hose as told by your doctor.  Raise the foot of your bed as told by your doctor.  Do not use any products that contain nicotine or tobacco, such as cigarettes, e-cigarettes, and chewing tobacco. If you need help quitting, ask your doctor.  Keep all follow-up visits as told by your doctor. This is important. Contact a doctor if:  Your  ulcer is getting larger or is not healing.  Your pain gets worse. Get help right away if:  You have more redness, swelling, or pain around your ulcer.  You have more fluid or blood coming from your ulcer.  Your ulcer feels warm to the touch.  You have pus or a bad smell coming from your ulcer.  You have a fever. Summary  A venous ulcer is a shallow sore on your lower leg.  Follow instructions from your doctor about how to take care of your wound.  Check your wound each day for signs of infection.  Take over-the-counter and prescription medicines only as told by your doctor.  Keep all follow-up visits as told by your doctor. This is important. This information is not intended to replace advice given to you by your health care provider. Make sure you discuss any questions you have with your health care provider. Document Released: 12/27/2004 Document Revised: 07/17/2018 Document Reviewed: 07/17/2018 Elsevier Patient Education  2020 [employer].

## 2019-11-04 NOTE — Telephone Encounter (Signed)
Yesterday afternoon patient had red spot above her left ankle- red and tender, warm to touch. Patient has had recent shoulder surgery.  Reason for Disposition . [1] Thigh, calf, or ankle swelling AND [2] only 1 side  Answer Assessment - Initial Assessment Questions 1. ONSET: "When did the pain start?"      Last night 2. LOCATION: "Where is the pain located?"      Left leg- above ankle 3. PAIN: "How bad is the pain?"    (Scale 1-10; or mild, moderate, severe)   -  MILD (1-3): doesn't interfere with normal activities    -  MODERATE (4-7): interferes with normal activities (e.g., work or school) or awakens from sleep, limping    -  SEVERE (8-10): excruciating pain, unable to do any normal activities, unable to walk     Still-4, walking 6-7 4. WORK OR EXERCISE: "Has there been any recent work or exercise that involved this part of the body?"      No injury- recent surgery 5. CAUSE: "What do you think is causing the leg pain?"     Possible clot 6. OTHER SYMPTOMS: "Do you have any other symptoms?" (e.g., chest pain, back pain, breathing difficulty, swelling, rash, fever, numbness, weakness)     no 7. PREGNANCY: "Is there any chance you are pregnant?" "When was your last menstrual period?"     n/a  Protocols used: LEG PAIN-A-AH

## 2019-11-04 NOTE — Telephone Encounter (Signed)
CRITICAL VALUE STICKER  CRITICAL VALUE: WBC-19.3  RECEIVER (on-site recipient of call): Honor Loh  DATE & TIME NOTIFIED: 11/04/19 @ 1445    MESSENGER (representative from lab): Unknown  MD NOTIFIED: Dr. Orma Flaming  TIME OF NOTIFICATION: 1500  RESPONSE: patient contacted via phone by Dr. Rogers Blocker

## 2019-11-04 NOTE — Telephone Encounter (Signed)
See note

## 2019-11-05 ENCOUNTER — Encounter: Payer: Self-pay | Admitting: Family Medicine

## 2019-11-05 DIAGNOSIS — M25611 Stiffness of right shoulder, not elsewhere classified: Secondary | ICD-10-CM | POA: Diagnosis not present

## 2019-11-05 DIAGNOSIS — M25511 Pain in right shoulder: Secondary | ICD-10-CM | POA: Diagnosis not present

## 2019-11-09 ENCOUNTER — Encounter: Payer: Self-pay | Admitting: Family Medicine

## 2019-11-09 ENCOUNTER — Ambulatory Visit (INDEPENDENT_AMBULATORY_CARE_PROVIDER_SITE_OTHER): Payer: BC Managed Care – PPO | Admitting: Family Medicine

## 2019-11-09 VITALS — BP 116/64 | HR 74 | Temp 97.6°F | Ht 64.0 in | Wt 216.0 lb

## 2019-11-09 DIAGNOSIS — L03116 Cellulitis of left lower limb: Secondary | ICD-10-CM | POA: Diagnosis not present

## 2019-11-09 DIAGNOSIS — L4050 Arthropathic psoriasis, unspecified: Secondary | ICD-10-CM

## 2019-11-09 DIAGNOSIS — D72828 Other elevated white blood cell count: Secondary | ICD-10-CM

## 2019-11-09 MED ORDER — PREDNISONE 10 MG PO TABS: ORAL_TABLET | ORAL | 0 refills | Status: DC

## 2019-11-09 NOTE — Patient Instructions (Signed)
If this is not improving, let me know!   If you have any questions or concerns, please don't hesitate to send me a message via MyChart or call the office at [phone]. Thank you for visiting with [place] today! It's our pleasure caring for you.

## 2019-11-09 NOTE — Progress Notes (Signed)
Subjective  CC:  Chief Complaint  Patient presents with  . Ulcer of Leg    HPI: Morgan [name] is a 68 y.o. female who presents to the office today to address the problems listed above in the chief complaint.  I've reviewed prior notes/pics and labs. Here for f/u of left medial leg pain with redness and swelling: treated for cellulitis with keflex; the redness has lessened. It is no longer warm. But still with pain, at posterior ankle and burning stinging pains on the medial lower leg. No f/c/s or systemic sxs. No posterior calf pain or swelling. No blistering. She has been actively working to get her psoriasis improved. She reports her legs were "terrible" with red flaking spots all over; the rheumatologist is now thinking she has overlapping psoriatic arthritis and will be changing her medication. She reports her psoriasis on her legs is now much better.   H/o reactive leukocytosis: always elevated with RA flared. Has been as high as 50K; most recently was 19k, no left shift.    Assessment  1. Psoriatic arthritis ([place])   2. Cellulitis of left leg   3. Other elevated white blood cell (WBC) count      Plan   Psoriatic arthritis with celluitis:  Suspect arthritis flare with secondary cellulitis from recent psoriasis flare. To complete keflex since cellulitis looks better and add pred for arthritis flare. advil if needed for pain. Monitor for improvement over the next 48-76 hours. Call if any new or worsening sxs develop. Patient understands and agrees with care plan.    Follow up:   12/01/2019  No orders of the defined types were placed in this encounter.  Meds ordered this encounter  Medications  . predniSONE (DELTASONE) 10 MG tablet    Sig: Take 4 tabs qd x 2 days, 3 qd x 2 days, 2 qd x 2d, 1qd x 3 days    Dispense:  21 tablet    Refill:  0      I reviewed the patients updated PMH, FH, and SocHx.    Patient Active Problem List   Diagnosis Date Noted  . S/P reverse  total shoulder arthroplasty, 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  . Bilateral sensorineural hearing loss 02/02/2019  . Hypercalcemia 12/11/2018  . H/O partial resection of colon 01/31/2018  . Diverticulitis with abscess status post robotic low anterior rectosigmoid resection 12/11/2017 12/11/2017  . Rheumatoid arthritis involving multiple sites with positive rheumatoid factor ([place]) 10/31/2017  . Immunosuppressed status ([place]) 10/08/2017  . Obesity with body mass index 30 or greater 01/25/2017  . Current chronic use of systemic steroids 06/08/2016  . Uses hearing aid 12/09/2015  . Leukocytosis 08/27/2014  . Thrombocytopenia ([place]) 11/23/2013  . Fatty liver disease, nonalcoholic [id number]  . Benign colon polyp 10/20/2008  . Essential hypertension 10/20/2008  . Diverticulosis of colon 10/20/2008  . Mixed hyperlipidemia 04/23/2008  . VENOUS INSUFFICIENCY 04/23/2008  . Allergic rhinitis 04/23/2008  . Gastroesophageal reflux disease without esophagitis 04/23/2008   Current Meds  Medication Sig  . acetaminophen (TYLENOL) 500 MG tablet Take 500 mg by mouth every 6 (six) hours as needed for moderate pain or headache.   . Ascorbic Acid (VITAMIN C) 1000 MG tablet Take 1,000 mg by mouth daily.  Mar[place] Kitchen aspirin 81 MG tablet Take 81 mg by mouth 3 (three) times a week.   . calcipotriene (DOVONOX) 0.005 % ointment APPLY TO AFFECTED AREA TWICE A DAY ON SATURDAY AND SUNDAY  . Calcium Carbonate-Vitamin D 600-400 MG-UNIT tablet Take 1  tablet by mouth 2 (two) times daily.   . cephALEXin (KEFLEX) 250 MG capsule Take 1 capsule (250 mg total) by mouth 4 (four) times daily.  . Cholecalciferol (VITAMIN D) 50 MCG (2000 UT) CAPS Take 2,000 Units by mouth daily.   . clobetasol ointment (TEMOVATE) 0.05 % APPLY TO AFFECTED AREA TWICE A DAY  . doxepin (SINEQUAN) 10 MG capsule Take 10 mg by mouth 2 (two) times daily.   . fenofibrate 160 MG tablet Take 1 tablet (160 mg total) by mouth at bedtime.  . fexofenadine (ALLEGRA) 180 MG tablet Take 180  mg by mouth daily.  . Golimumab (SIMPONI [place]) Inject into the skin.   . hydrochlorothiazide (HYDRODIURIL) 25 MG tablet TAKE 1 TABLET BY MOUTH EVERY DAY  . hydroxychloroquine (PLAQUENIL) 200 MG tablet Take 400 mg by mouth 2 (two) times daily.   . Hypromellose (ARTIFICIAL TEARS OP) Place 1 drop into both eyes daily as needed (for dry eyes).  . Multiple Vitamin (MULTIVITAMIN) tablet Take 1 tablet by mouth daily.    . predniSONE (DELTASONE) 5 MG tablet Take 5 mg by mouth daily.  . simvastatin (ZOCOR) 40 MG tablet TAKE ONE TABLET (40 MG TOTAL) BY MOUTH AT BEDTIME. (Patient taking differently: Take 40 mg by mouth every evening. )    Allergies: Patient is allergic to remicade [infliximab]. Family History: Patient family history includes Arthritis in her mother; Atrial fibrillation in her mother; Breast cancer in an other family member; COPD in her mother; Diabetes in her maternal aunt, maternal uncle, and sister; Healthy in her sister; Heart disease in her mother and sister; Heart failure in her brother and son; Hyperlipidemia in her mother; Hypertension in her mother; Liver disease in her sister. Social History:  Patient  reports that she quit smoking about 17 years ago. Her smoking use included cigarettes. She has a 7.50 pack-year smoking history. She has never used smokeless tobacco. She reports current alcohol use. She reports that she does not use drugs.  Review of Systems: Constitutional: Negative for fever malaise or anorexia Cardiovascular: negative for chest pain Respiratory: negative for SOB or persistent cough Gastrointestinal: negative for abdominal pain  Objective  Vitals: BP 116/64 (BP Location: Left Arm, Patient Position: Sitting, Cuff Size: Large)   Pulse 74   Temp 97.6 F (36.4 C) (Temporal)   Ht 5\' 4"  (1.626 m)   Wt 216 lb (98 kg)   SpO2 99%   BMI 37.08 kg/m  General: no acute distress , A&Ox3 Appears well.  Skin:  Warm,psoriatic plaques, small on bilateral legs. Medial  left ankle with erythematous area with mild induration and small bullae, mild ttp, no warmth or fluctuation or streaking, surrounding edema. Ankle: from with mild pain. Nl gait. No calf ttp or cords palpated. Right LE w/o erythema, + psoriatic plaques.   Lab Results  Component Value Date   WBC 19.3 Repeated and verified X2. (HH) 11/04/2019   HGB 12.1 11/04/2019   HCT 36.8 11/04/2019   MCV 91.9 11/04/2019   PLT 91.0 (L) 11/04/2019        Commons side effects, risks, benefits, and alternatives for medications and treatment plan prescribed today were discussed, and the patient expressed understanding of the given instructions. Patient is instructed to call or message via MyChart if he/she has any questions or concerns regarding our treatment plan. No barriers to understanding were identified. We discussed Red Flag symptoms and signs in detail. Patient expressed understanding regarding what to do in case of urgent or emergency type  symptoms.   Medication list was reconciled, printed and provided to the patient in AVS. Patient instructions and summary information was reviewed with the patient as documented in the AVS. This note was prepared with assistance of Dragon voice recognition software. Occasional wrong-word or sound-a-like substitutions may have occurred due to the inherent limitations of voice recognition software  This visit occurred during the SARS-CoV-2 public health emergency.  Safety protocols were in place, including screening questions prior to the visit, additional usage of staff PPE, and extensive cleaning of exam room while observing appropriate contact time as indicated for disinfecting solutions.

## 2019-11-10 DIAGNOSIS — M25611 Stiffness of right shoulder, not elsewhere classified: Secondary | ICD-10-CM | POA: Diagnosis not present

## 2019-11-10 DIAGNOSIS — M25511 Pain in right shoulder: Secondary | ICD-10-CM | POA: Diagnosis not present

## 2019-11-12 DIAGNOSIS — Z23 Encounter for immunization: Secondary | ICD-10-CM | POA: Diagnosis not present

## 2019-11-12 DIAGNOSIS — L409 Psoriasis, unspecified: Secondary | ICD-10-CM | POA: Diagnosis not present

## 2019-11-13 DIAGNOSIS — M25511 Pain in right shoulder: Secondary | ICD-10-CM | POA: Diagnosis not present

## 2019-11-13 DIAGNOSIS — M25611 Stiffness of right shoulder, not elsewhere classified: Secondary | ICD-10-CM | POA: Diagnosis not present

## 2019-11-17 DIAGNOSIS — M25511 Pain in right shoulder: Secondary | ICD-10-CM | POA: Diagnosis not present

## 2019-11-17 DIAGNOSIS — M25611 Stiffness of right shoulder, not elsewhere classified: Secondary | ICD-10-CM | POA: Diagnosis not present

## 2019-11-19 DIAGNOSIS — M25611 Stiffness of right shoulder, not elsewhere classified: Secondary | ICD-10-CM | POA: Diagnosis not present

## 2019-11-19 DIAGNOSIS — M25511 Pain in right shoulder: Secondary | ICD-10-CM | POA: Diagnosis not present

## 2019-11-20 ENCOUNTER — Other Ambulatory Visit: Payer: Self-pay | Admitting: Family Medicine

## 2019-11-23 DIAGNOSIS — M25511 Pain in right shoulder: Secondary | ICD-10-CM | POA: Diagnosis not present

## 2019-11-23 DIAGNOSIS — M25611 Stiffness of right shoulder, not elsewhere classified: Secondary | ICD-10-CM | POA: Diagnosis not present

## 2019-11-24 ENCOUNTER — Other Ambulatory Visit: Payer: Self-pay | Admitting: Family Medicine

## 2019-11-24 DIAGNOSIS — H903 Sensorineural hearing loss, bilateral: Secondary | ICD-10-CM | POA: Diagnosis not present

## 2019-11-26 DIAGNOSIS — M25511 Pain in right shoulder: Secondary | ICD-10-CM | POA: Diagnosis not present

## 2019-11-26 DIAGNOSIS — M25611 Stiffness of right shoulder, not elsewhere classified: Secondary | ICD-10-CM | POA: Diagnosis not present

## 2019-11-30 ENCOUNTER — Other Ambulatory Visit: Payer: Self-pay

## 2019-11-30 DIAGNOSIS — M25611 Stiffness of right shoulder, not elsewhere classified: Secondary | ICD-10-CM | POA: Diagnosis not present

## 2019-11-30 DIAGNOSIS — M25511 Pain in right shoulder: Secondary | ICD-10-CM | POA: Diagnosis not present

## 2019-12-01 ENCOUNTER — Ambulatory Visit (INDEPENDENT_AMBULATORY_CARE_PROVIDER_SITE_OTHER): Payer: BC Managed Care – PPO | Admitting: Family Medicine

## 2019-12-01 ENCOUNTER — Encounter: Payer: Self-pay | Admitting: Family Medicine

## 2019-12-01 VITALS — BP 118/72 | HR 77 | Temp 98.0°F | Ht 64.0 in | Wt 217.8 lb

## 2019-12-01 DIAGNOSIS — I10 Essential (primary) hypertension: Secondary | ICD-10-CM | POA: Diagnosis not present

## 2019-12-01 DIAGNOSIS — D849 Immunodeficiency, unspecified: Secondary | ICD-10-CM | POA: Diagnosis not present

## 2019-12-01 DIAGNOSIS — K219 Gastro-esophageal reflux disease without esophagitis: Secondary | ICD-10-CM

## 2019-12-01 DIAGNOSIS — H903 Sensorineural hearing loss, bilateral: Secondary | ICD-10-CM | POA: Diagnosis not present

## 2019-12-01 DIAGNOSIS — D72828 Other elevated white blood cell count: Secondary | ICD-10-CM | POA: Diagnosis not present

## 2019-12-01 DIAGNOSIS — M0579 Rheumatoid arthritis with rheumatoid factor of multiple sites without organ or systems involvement: Secondary | ICD-10-CM

## 2019-12-01 DIAGNOSIS — E782 Mixed hyperlipidemia: Secondary | ICD-10-CM | POA: Diagnosis not present

## 2019-12-01 DIAGNOSIS — T50905A Adverse effect of unspecified drugs, medicaments and biological substances, initial encounter: Secondary | ICD-10-CM

## 2019-12-01 LAB — COMPREHENSIVE METABOLIC PANEL
ALT: 26 U/L (ref 0–35)
AST: 32 U/L (ref 0–37)
Albumin: 4.2 g/dL (ref 3.5–5.2)
Alkaline Phosphatase: 48 U/L (ref 39–117)
BUN: 25 mg/dL — ABNORMAL HIGH (ref 6–23)
CO2: 29 mEq/L (ref 19–32)
Calcium: 9.8 mg/dL (ref 8.4–10.5)
Chloride: 102 mEq/L (ref 96–112)
Creatinine, Ser: 0.97 mg/dL (ref 0.40–1.20)
GFR: 56.67 mL/min — ABNORMAL LOW (ref >60.00)
Glucose, Bld: 90 mg/dL (ref 70–99)
Potassium: 3.9 mEq/L (ref 3.5–5.1)
Sodium: 140 mEq/L (ref 135–145)
Total Bilirubin: 0.4 mg/dL (ref 0.2–1.2)
Total Protein: 6.8 g/dL (ref 6.0–8.3)

## 2019-12-01 LAB — CBC WITH DIFFERENTIAL/PLATELET
Basophils Absolute: 0.1 10*3/uL (ref 0.0–0.1)
Basophils Relative: 0.6 % (ref 0.0–3.0)
Eosinophils Absolute: 0.1 10*3/uL (ref 0.0–0.7)
Eosinophils Relative: 0.7 % (ref 0.0–5.0)
HCT: 37.5 % (ref 36.0–46.0)
Hemoglobin: 12.5 g/dL (ref 12.0–15.0)
Lymphocytes Relative: 16.1 % (ref 12.0–46.0)
Lymphs Abs: 1.9 10*3/uL (ref 0.7–4.0)
MCHC: 33.3 g/dL (ref 30.0–36.0)
MCV: 90.6 fl (ref 78.0–100.0)
Monocytes Absolute: 3 10*3/uL — ABNORMAL HIGH (ref 0.1–1.0)
Monocytes Relative: 25.5 % — ABNORMAL HIGH (ref 3.0–12.0)
Neutro Abs: 6.7 10*3/uL (ref 1.4–7.7)
Neutrophils Relative %: 57.1 % (ref 43.0–77.0)
Platelets: 106 10*3/uL — ABNORMAL LOW (ref 150.0–400.0)
RBC: 4.14 Mil/uL (ref 3.87–5.11)
RDW: 16.3 % — ABNORMAL HIGH (ref 11.5–15.5)
WBC: 11.7 10*3/uL — ABNORMAL HIGH (ref 4.0–10.5)

## 2019-12-01 LAB — LIPID PANEL
Cholesterol: 138 mg/dL (ref 0–200)
HDL: 49.1 mg/dL (ref >39.00)
LDL Cholesterol: 66 mg/dL (ref 0–99)
NonHDL: 88.66
Total CHOL/HDL Ratio: 3
Triglycerides: 111 mg/dL (ref 0.0–149.0)
VLDL: 22.2 mg/dL (ref 0.0–40.0)

## 2019-12-01 LAB — TSH: TSH: 2.49 u[IU]/mL (ref 0.35–4.50)

## 2019-12-01 MED ORDER — ERYTHROMYCIN 5 MG/GM OP OINT: 1.0000 "application " | TOPICAL_OINTMENT | Freq: Three times a day (TID) | OPHTHALMIC | 0 refills | Status: DC

## 2019-12-01 NOTE — Patient Instructions (Signed)
Please return in 6 months for follow up of your hypertension.  I will release your lab results to you on your MyChart account with further instructions. Please reply with any questions.   If you have any questions or concerns, please don't hesitate to send me a message via MyChart or call the office at [phone]. Thank you for visiting with [place] today! It's our pleasure caring for you.

## 2019-12-01 NOTE — Progress Notes (Signed)
Subjective  CC:  Chief Complaint  Patient presents with  . Hypertension    6 month follow up  . Hyperlipidemia    HPI: Morgan [name] is a 68 y.o. female who presents to the office today to address the problems listed above in the chief complaint.  Hypertension f/u: Control is good . Pt reports she is doing well. taking medications as instructed, no medication side effects noted, no TIAs, no chest pain on exertion, no dyspnea on exertion, no swelling of ankles. She denies adverse effects from his BP medications. Compliance with medication is good.   Hypercalcemia, mild w/o sxs on hctz. She take calcium supplements regularly  HLD: on statin and tolerating well. Due for recheck. H/o NASH> monitoring lfts  RA on immunosuppressants: being very cautious due to covid pandemic. Understands risks. Would be high risk and due covid vaccine once available. RA is improving.  Psoriasis: started on cosentyx recenlty per rheum and feels it has been beneficial. No AEs at this time. Had 2 loading doses.   H/o elevated WBC, reactive   GERD is stable.   HM: screens are up to date.   Assessment  1. Essential hypertension   2. Hypercalcemia due to a drug   3. Immunosuppressed status ([place])   4. Other elevated white blood cell (WBC) count   5. Mixed hyperlipidemia   6. Rheumatoid arthritis involving multiple sites with positive rheumatoid factor (HCC)   7. Gastroesophageal reflux disease without esophagitis      Plan    Hypertension f/u: BP control is well controlled. Continue same meds. Check renal function and electrolytes.  RA and psoriasis: reviewed skin care; has steroid dermopathy. Improving on meds.  Hyperlipidemia f/u: recheck lipids on statin. Monitoring lfts.   Stye right eye: emycin oinment tid  Monitoring wbc.   High risk covid patient. Wants vaccine Education regarding management of these chronic disease states was given. Management strategies discussed on successive  visits include dietary and exercise recommendations, goals of achieving and maintaining IBW, and lifestyle modifications aiming for adequate sleep and minimizing stressors.   Follow up: 6 months for htn fu  Orders Placed This Encounter  Procedures  . CMP  . CBC w/Diff  . Lipids  . TSH   Meds ordered this encounter  Medications  . erythromycin ophthalmic ointment    Sig: Place 1 application into the left eye 3 (three) times daily.    Dispense:  3.5 g    Refill:  0      BP Readings from Last 3 Encounters:  12/01/19 118/72  11/09/19 116/64  11/04/19 110/66   Wt Readings from Last 3 Encounters:  12/01/19 217 lb 12.8 oz (98.8 kg)  11/09/19 216 lb (98 kg)  11/04/19 216 lb (98 kg)    Lab Results  Component Value Date   CHOL 138 12/01/2019   CHOL 157 12/08/2018   CHOL 139 12/09/2017   Lab Results  Component Value Date   HDL 49.10 12/01/2019   HDL 66.80 12/08/2018   HDL 44.60 12/09/2017   Lab Results  Component Value Date   LDLCALC 66 12/01/2019   LDLCALC 72 12/08/2018   LDLCALC 70 12/09/2017   Lab Results  Component Value Date   TRIG 111.0 12/01/2019   TRIG 90.0 12/08/2018   TRIG 121.0 12/09/2017   Lab Results  Component Value Date   CHOLHDL 3 12/01/2019   CHOLHDL 2 12/08/2018   CHOLHDL 3 12/09/2017   Lab Results  Component Value Date  LDLDIRECT 100.9 05/28/2007   LDLDIRECT 109.2 01/28/2007   Lab Results  Component Value Date   CREATININE 0.97 12/01/2019   BUN 25 (H) 12/01/2019   NA 140 12/01/2019   K 3.9 12/01/2019   CL 102 12/01/2019   CO2 29 12/01/2019    The 10-year ASCVD risk score Mikey Bussing DC Jr., et al., 2013) is: 9.9%   Values used to calculate the score:     Age: 64 years     Sex: Female     Is Non-Hispanic African American: No     Diabetic: No     Tobacco smoker: No     Systolic Blood Pressure: 123456 mmHg     Is BP treated: Yes     HDL Cholesterol: 49.1 mg/dL     Total Cholesterol: 138 mg/dL  I reviewed the patients updated PMH,  FH, and SocHx.    Patient Active Problem List   Diagnosis Date Noted  . 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 Tear of right rotator cuff 07/14/2019  . Bilateral sensorineural hearing loss 02/02/2019  . Hypercalcemia 12/11/2018  . H/O partial resection of colon 01/31/2018  . Diverticulitis with abscess status post robotic low anterior rectosigmoid resection 12/11/2017 12/11/2017  . Rheumatoid arthritis involving multiple sites with positive rheumatoid factor ([place]) 10/31/2017  . Immunosuppressed status ([place]) 10/08/2017  . Obesity with body mass index 30 or greater 01/25/2017  . Current chronic use of systemic steroids 06/08/2016  . Uses hearing aid 12/09/2015  . Leukocytosis 08/27/2014  . Thrombocytopenia ([place]) 11/23/2013  . Fatty liver disease, nonalcoholic [id number]  . Benign colon polyp 10/20/2008  . Essential hypertension 10/20/2008  . Diverticulosis of colon 10/20/2008  . Mixed hyperlipidemia 04/23/2008  . VENOUS INSUFFICIENCY 04/23/2008  . Allergic rhinitis 04/23/2008  . Gastroesophageal reflux disease without esophagitis 04/23/2008    Allergies: Remicade [infliximab]  Social History: Patient  reports that she quit smoking about 18 years ago. Her smoking use included cigarettes. She has a 7.50 pack-year smoking history. She has never used smokeless tobacco. She reports current alcohol use. She reports that she does not use drugs.  Current Meds  Medication Sig  . acetaminophen (TYLENOL) 500 MG tablet Take 500 mg by mouth every 6 (six) hours as needed for moderate pain or headache.   . Ascorbic Acid (VITAMIN C) 1000 MG tablet Take 1,000 mg by mouth daily.  Marland Kitchen aspirin 81 MG tablet Take 81 mg by mouth 3 (three) times a week.   . Calcium Carbonate-Vitamin D 600-400 MG-UNIT tablet Take 1 tablet by mouth 2 (two) times daily.   . Cholecalciferol (VITAMIN D) 50 MCG (2000 UT) CAPS Take 2,000 Units by mouth daily.   . clobetasol ointment (TEMOVATE) 0.05 % APPLY TO  AFFECTED AREA TWICE A DAY  . doxepin (SINEQUAN) 10 MG capsule Take 10 mg by mouth 2 (two) times daily.   . fenofibrate 160 MG tablet TAKE 1 TABLET (160 MG TOTAL) BY MOUTH AT BEDTIME.  . fexofenadine (ALLEGRA) 180 MG tablet Take 180 mg by mouth daily.  . hydrochlorothiazide (HYDRODIURIL) 25 MG tablet TAKE 1 TABLET BY MOUTH EVERY DAY  . hydroxychloroquine (PLAQUENIL) 200 MG tablet Take 400 mg by mouth 2 (two) times daily.   . Hypromellose (ARTIFICIAL TEARS OP) Place 1 drop into both eyes daily as needed (for dry eyes).  . Multiple Vitamin (MULTIVITAMIN) tablet Take 1 tablet by mouth daily.    . predniSONE (DELTASONE) 10 MG tablet Take 4 tabs qd x 2 days, 3  qd x 2 days, 2 qd x 2d, 1qd x 3 days  . predniSONE (DELTASONE) 5 MG tablet Take 5 mg by mouth daily.  . simvastatin (ZOCOR) 40 MG tablet TAKE 1 TABLET BY MOUTH EVERYDAY AT BEDTIME    Review of Systems: Cardiovascular: negative for chest pain, palpitations, leg swelling, orthopnea Respiratory: negative for SOB, wheezing or persistent cough Gastrointestinal: negative for abdominal pain Genitourinary: negative for dysuria or gross hematuria  Objective  Vitals: BP 118/72 (BP Location: Right Arm, Patient Position: Sitting, Cuff Size: Normal)   Pulse 77   Temp 98 F (36.7 C) (Temporal)   Ht 5\' 4"  (1.626 m)   Wt 217 lb 12.8 oz (98.8 kg)   SpO2 97%   BMI 37.39 kg/m  General: no acute distress  Psych:  Alert and oriented, normal mood and affect HEENT:  Normocephalic, atraumatic, supple neck  Cardiovascular:  RRR without murmur. no edema Respiratory:  Good breath sounds bilaterally, CTAB with normal respiratory effort Skin:  Warm, multiple ecchymosis, thin skin. Some flaking patches Neurologic:   Mental status is normal  Commons side effects, risks, benefits, and alternatives for medications and treatment plan prescribed today were discussed, and the patient expressed understanding of the given instructions. Patient is instructed to call  or message via MyChart if he/she has any questions or concerns regarding our treatment plan. No barriers to understanding were identified. We discussed Red Flag symptoms and signs in detail. Patient expressed understanding regarding what to do in case of urgent or emergency type symptoms.   Medication list was reconciled, printed and provided to the patient in AVS. Patient instructions and summary information was reviewed with the patient as documented in the AVS. This note was prepared with assistance of Dragon voice recognition software. Occasional wrong-word or sound-a-like substitutions may have occurred due to the inherent limitations of voice recognition software  This visit occurred during the SARS-CoV-2 public health emergency.  Safety protocols were in place, including screening questions prior to the visit, additional usage of staff PPE, and extensive cleaning of exam room while observing appropriate contact time as indicated for disinfecting solutions.

## 2019-12-02 DIAGNOSIS — M25611 Stiffness of right shoulder, not elsewhere classified: Secondary | ICD-10-CM | POA: Diagnosis not present

## 2019-12-02 DIAGNOSIS — Z96611 Presence of right artificial shoulder joint: Secondary | ICD-10-CM | POA: Diagnosis not present

## 2019-12-02 DIAGNOSIS — M25511 Pain in right shoulder: Secondary | ICD-10-CM | POA: Diagnosis not present

## 2019-12-02 DIAGNOSIS — Z471 Aftercare following joint replacement surgery: Secondary | ICD-10-CM | POA: Diagnosis not present

## 2019-12-08 DIAGNOSIS — M25611 Stiffness of right shoulder, not elsewhere classified: Secondary | ICD-10-CM | POA: Diagnosis not present

## 2019-12-08 DIAGNOSIS — M25511 Pain in right shoulder: Secondary | ICD-10-CM | POA: Diagnosis not present

## 2019-12-10 DIAGNOSIS — M25611 Stiffness of right shoulder, not elsewhere classified: Secondary | ICD-10-CM | POA: Diagnosis not present

## 2019-12-10 DIAGNOSIS — M25511 Pain in right shoulder: Secondary | ICD-10-CM | POA: Diagnosis not present

## 2019-12-14 DIAGNOSIS — M25511 Pain in right shoulder: Secondary | ICD-10-CM | POA: Diagnosis not present

## 2019-12-14 DIAGNOSIS — M25611 Stiffness of right shoulder, not elsewhere classified: Secondary | ICD-10-CM | POA: Diagnosis not present

## 2019-12-18 DIAGNOSIS — M25611 Stiffness of right shoulder, not elsewhere classified: Secondary | ICD-10-CM | POA: Diagnosis not present

## 2019-12-18 DIAGNOSIS — M25511 Pain in right shoulder: Secondary | ICD-10-CM | POA: Diagnosis not present

## 2019-12-22 DIAGNOSIS — M25511 Pain in right shoulder: Secondary | ICD-10-CM | POA: Diagnosis not present

## 2019-12-22 DIAGNOSIS — M25611 Stiffness of right shoulder, not elsewhere classified: Secondary | ICD-10-CM | POA: Diagnosis not present

## 2019-12-24 DIAGNOSIS — M25611 Stiffness of right shoulder, not elsewhere classified: Secondary | ICD-10-CM | POA: Diagnosis not present

## 2019-12-24 DIAGNOSIS — M25511 Pain in right shoulder: Secondary | ICD-10-CM | POA: Diagnosis not present

## 2019-12-28 ENCOUNTER — Encounter: Payer: Self-pay | Admitting: Family Medicine

## 2019-12-29 DIAGNOSIS — M25611 Stiffness of right shoulder, not elsewhere classified: Secondary | ICD-10-CM | POA: Diagnosis not present

## 2019-12-29 NOTE — Telephone Encounter (Signed)
Is there a website suggest?   Please advise.

## 2019-12-31 DIAGNOSIS — M25511 Pain in right shoulder: Secondary | ICD-10-CM | POA: Diagnosis not present

## 2019-12-31 DIAGNOSIS — M25611 Stiffness of right shoulder, not elsewhere classified: Secondary | ICD-10-CM | POA: Diagnosis not present

## 2020-01-02 ENCOUNTER — Ambulatory Visit: Payer: BC Managed Care – PPO

## 2020-01-05 DIAGNOSIS — M25611 Stiffness of right shoulder, not elsewhere classified: Secondary | ICD-10-CM | POA: Diagnosis not present

## 2020-01-07 ENCOUNTER — Ambulatory Visit: Payer: BC Managed Care – PPO

## 2020-01-07 DIAGNOSIS — M25611 Stiffness of right shoulder, not elsewhere classified: Secondary | ICD-10-CM | POA: Diagnosis not present

## 2020-01-12 DIAGNOSIS — L570 Actinic keratosis: Secondary | ICD-10-CM | POA: Diagnosis not present

## 2020-01-12 DIAGNOSIS — B353 Tinea pedis: Secondary | ICD-10-CM | POA: Diagnosis not present

## 2020-01-12 DIAGNOSIS — Z23 Encounter for immunization: Secondary | ICD-10-CM | POA: Diagnosis not present

## 2020-01-12 DIAGNOSIS — L578 Other skin changes due to chronic exposure to nonionizing radiation: Secondary | ICD-10-CM | POA: Diagnosis not present

## 2020-01-12 DIAGNOSIS — L409 Psoriasis, unspecified: Secondary | ICD-10-CM | POA: Diagnosis not present

## 2020-01-14 DIAGNOSIS — M25611 Stiffness of right shoulder, not elsewhere classified: Secondary | ICD-10-CM | POA: Diagnosis not present

## 2020-01-19 DIAGNOSIS — M25611 Stiffness of right shoulder, not elsewhere classified: Secondary | ICD-10-CM | POA: Diagnosis not present

## 2020-01-25 ENCOUNTER — Other Ambulatory Visit: Payer: Self-pay

## 2020-01-25 ENCOUNTER — Ambulatory Visit (INDEPENDENT_AMBULATORY_CARE_PROVIDER_SITE_OTHER): Payer: BC Managed Care – PPO | Admitting: Family Medicine

## 2020-01-25 ENCOUNTER — Encounter: Payer: Self-pay | Admitting: Family Medicine

## 2020-01-25 ENCOUNTER — Other Ambulatory Visit (HOSPITAL_COMMUNITY)
Admission: RE | Admit: 2020-01-25 | Discharge: 2020-01-25 | Disposition: A | Discharge status: Home / Self Care | Payer: BC Managed Care – PPO | Source: Ambulatory Visit | Attending: Family Medicine | Admitting: Family Medicine

## 2020-01-25 VITALS — BP 122/78 | HR 75 | Temp 97.5°F | Resp 15 | Wt 217.8 lb

## 2020-01-25 DIAGNOSIS — R300 Dysuria: Secondary | ICD-10-CM | POA: Diagnosis not present

## 2020-01-25 DIAGNOSIS — N898 Other specified noninflammatory disorders of vagina: Secondary | ICD-10-CM | POA: Insufficient documentation

## 2020-01-25 LAB — POCT URINALYSIS DIPSTICK
Bilirubin, UA: NEGATIVE
Blood, UA: POSITIVE
Glucose, UA: NEGATIVE
Ketones, UA: NEGATIVE
Leukocytes, UA: NEGATIVE
Nitrite, UA: NEGATIVE
Protein, UA: NEGATIVE
Spec Grav, UA: 1.01 (ref 1.010–1.025)
Urobilinogen, UA: 0.2 E.U./dL
pH, UA: 7.5 (ref 5.0–8.0)

## 2020-01-25 NOTE — Patient Instructions (Signed)
Please follow up if symptoms do not improve or as needed.   Your initial urine test is normal.  I will await the urine culture and vaginal swab test results. IF there is infection,  I will let you know and treat. These test results can take a few days to return, so if your symptoms worsen, please let me know.

## 2020-01-25 NOTE — Progress Notes (Signed)
Subjective   CC:  Chief Complaint  Patient presents with  . Urinary Tract Infection    x 4 days, frequnecy, abdominal pressure    HPI: Morgan [name] is a 69 y.o. female who presents to the office today to address the problems listed above in the chief complaint.  Patient reports dysuria and urinary frequency.  She has sensation of increased urinary pressure.  She denies fevers flank pain nausea vomiting or gross hematuria.  Symptoms have been present for several hours to days.  She denies history of interstitial cystitis.  She c/o mild vaginal itching without vaginal discharge or pelvic pain or rash or prolapse.   Assessment  1. Dysuria   2. Vagina itching      Plan   Dysuria: neg dipstick. Await culture. Treat if positive. If negative again, consider vaginal atrophy as cause of sxs  R/o yeast or bv given itching with lab test.   Follow up: No follow-ups on file.  Orders Placed This Encounter  Procedures  . Urine Culture  . POCT Urinalysis Dipstick   No orders of the defined types were placed in this encounter.     I reviewed the patients updated PMH, FH, and SocHx.    Patient Active Problem List   Diagnosis Date Noted  . 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 Tear of right rotator cuff 07/14/2019  . Bilateral sensorineural hearing loss 02/02/2019  . Hypercalcemia 12/11/2018  . H/O partial resection of colon 01/31/2018  . Diverticulitis with abscess status post robotic low anterior rectosigmoid resection 12/11/2017 12/11/2017  . Rheumatoid arthritis involving multiple sites with positive rheumatoid factor ([place]) 10/31/2017  . Immunosuppressed status ([place]) 10/08/2017  . Obesity with body mass index 30 or greater 01/25/2017  . Current chronic use of systemic steroids 06/08/2016  . Uses hearing aid 12/09/2015  . Leukocytosis 08/27/2014  . Thrombocytopenia ([place]) 11/23/2013  . Fatty liver disease, nonalcoholic [id number]  . Benign colon polyp  10/20/2008  . Essential hypertension 10/20/2008  . Diverticulosis of colon 10/20/2008  . Mixed hyperlipidemia 04/23/2008  . VENOUS INSUFFICIENCY 04/23/2008  . Allergic rhinitis 04/23/2008  . Gastroesophageal reflux disease without esophagitis 04/23/2008   Current Meds  Medication Sig  . acetaminophen (TYLENOL) 500 MG tablet Take 500 mg by mouth every 6 (six) hours as needed for moderate pain or headache.   Marland Kitchen aspirin 81 MG tablet Take 81 mg by mouth 3 (three) times a week.   . calcipotriene (DOVONOX) 0.005 % ointment APPLY TO AFFECTED AREA TWICE A DAY ON SATURDAY AND SUNDAY  . Calcium Carbonate-Vitamin D 600-400 MG-UNIT tablet Take 1 tablet by mouth 2 (two) times daily.   . Cholecalciferol (VITAMIN D) 50 MCG (2000 UT) CAPS Take 2,000 Units by mouth daily.   . clobetasol ointment (TEMOVATE) 0.05 % APPLY TO AFFECTED AREA TWICE A DAY  . COSENTYX SENSOREADY, 300 MG, 150 MG/ML SOAJ Inject 300 mg as directed once a week.  . doxepin (SINEQUAN) 10 MG capsule Take 10 mg by mouth 2 (two) times daily.   . fenofibrate 160 MG tablet TAKE 1 TABLET (160 MG TOTAL) BY MOUTH AT BEDTIME.  . fexofenadine (ALLEGRA) 180 MG tablet Take 180 mg by mouth daily.  . hydrochlorothiazide (HYDRODIURIL) 25 MG tablet TAKE 1 TABLET BY MOUTH EVERY DAY  . hydroxychloroquine (PLAQUENIL) 200 MG tablet Take 400 mg by mouth 2 (two) times daily.   . Hypromellose (ARTIFICIAL TEARS OP) Place 1 drop into both eyes daily as needed (for dry  eyes).  . Multiple Vitamin (MULTIVITAMIN) tablet Take 1 tablet by mouth daily.    . predniSONE (DELTASONE) 5 MG tablet Take 5 mg by mouth daily.  . simvastatin (ZOCOR) 40 MG tablet TAKE 1 TABLET BY MOUTH EVERYDAY AT BEDTIME    Review of Systems: Cardiovascular: negative for chest pain Respiratory: negative for SOB or persistent cough Gastrointestinal: negative for abdominal pain Constitutional: Negative for fever malaise or anorexia  Objective  Vitals: BP 122/78   Pulse 75   Temp (!) 97.5  F (36.4 C) (Temporal)   Resp 15   Wt 217 lb 12.8 oz (98.8 kg)   SpO2 98%   BMI 37.39 kg/m  General: no acute distress  Psych:  Alert and oriented, normal mood and affect Cardiovascular:  RRR without murmur or gallop. no peripheral edema Respiratory:  Good breath sounds bilaterally, CTAB with normal respiratory effort Gastrointestinal: soft, flat abdomen, normal active bowel sounds, no palpable masses, no hepatosplenomegaly, no appreciated hernias, NO CVAT, mild suprapubic ttp w/o rebound or guarding Skin:  Warm, no rashes Neurologic:   Mental status is normal. normal gait Office Visit on 01/25/2020  Component Date Value Ref Range Status  . Color, UA 01/25/2020 Yellow   Final  . Clarity, UA 01/25/2020 Clear   Final  . Glucose, UA 01/25/2020 Negative  Negative Final  . Bilirubin, UA 01/25/2020 Negative   Final  . Ketones, UA 01/25/2020 Negative   Final  . Spec Grav, UA 01/25/2020 1.010  1.010 - 1.025 Final  . Blood, UA 01/25/2020 Positive   Final  . pH, UA 01/25/2020 7.5  5.0 - 8.0 Final  . Protein, UA 01/25/2020 Negative  Negative Final  . Urobilinogen, UA 01/25/2020 0.2  0.2 or 1.0 E.U./dL Final  . Nitrite, UA 01/25/2020 Negative   Final  . Leukocytes, UA 01/25/2020 Negative  Negative Final    Commons side effects, risks, benefits, and alternatives for medications and treatment plan prescribed today were discussed, and the patient expressed understanding of the given instructions. Patient is instructed to call or message via MyChart if he/she has any questions or concerns regarding our treatment plan. No barriers to understanding were identified. We discussed Red Flag symptoms and signs in detail. Patient expressed understanding regarding what to do in case of urgent or emergency type symptoms.   Medication list was reconciled, printed and provided to the patient in AVS. Patient instructions and summary information was reviewed with the patient as documented in the AVS. This note  was prepared with assistance of Dragon voice recognition software. Occasional wrong-word or sound-a-like substitutions may have occurred due to the inherent limitations of voice recognition software

## 2020-01-26 LAB — URINE CULTURE
MICRO NUMBER:: 10173352
SPECIMEN QUALITY:: ADEQUATE

## 2020-01-27 ENCOUNTER — Other Ambulatory Visit: Payer: Self-pay | Admitting: Family Medicine

## 2020-01-27 ENCOUNTER — Encounter: Payer: Self-pay | Admitting: Family Medicine

## 2020-01-27 LAB — CERVICOVAGINAL ANCILLARY ONLY
Bacterial Vaginitis (gardnerella): NEGATIVE
Candida Glabrata: NEGATIVE
Candida Vaginitis: NEGATIVE
Comment: NEGATIVE
Comment: NEGATIVE
Comment: NEGATIVE

## 2020-01-27 MED ORDER — ESTROGENS, CONJUGATED 0.625 MG/GM VA CREA: 1.0000 | TOPICAL_CREAM | VAGINAL | 3 refills | Status: DC

## 2020-01-27 NOTE — Addendum Note (Signed)
Addended by: Billey Chang on: 01/27/2020 02:45 PM   Modules accepted: Orders

## 2020-01-28 ENCOUNTER — Ambulatory Visit: Payer: BC Managed Care – PPO

## 2020-01-28 DIAGNOSIS — M25611 Stiffness of right shoulder, not elsewhere classified: Secondary | ICD-10-CM | POA: Diagnosis not present

## 2020-02-02 DIAGNOSIS — M25611 Stiffness of right shoulder, not elsewhere classified: Secondary | ICD-10-CM | POA: Diagnosis not present

## 2020-02-04 DIAGNOSIS — M25611 Stiffness of right shoulder, not elsewhere classified: Secondary | ICD-10-CM | POA: Diagnosis not present

## 2020-02-11 DIAGNOSIS — M25611 Stiffness of right shoulder, not elsewhere classified: Secondary | ICD-10-CM | POA: Diagnosis not present

## 2020-02-15 ENCOUNTER — Other Ambulatory Visit: Payer: Self-pay | Admitting: Physician Assistant

## 2020-02-15 DIAGNOSIS — B9689 Other specified bacterial agents as the cause of diseases classified elsewhere: Secondary | ICD-10-CM

## 2020-02-16 DIAGNOSIS — M25611 Stiffness of right shoulder, not elsewhere classified: Secondary | ICD-10-CM | POA: Diagnosis not present

## 2020-02-18 DIAGNOSIS — M25611 Stiffness of right shoulder, not elsewhere classified: Secondary | ICD-10-CM | POA: Diagnosis not present

## 2020-02-22 DIAGNOSIS — Z96611 Presence of right artificial shoulder joint: Secondary | ICD-10-CM | POA: Diagnosis not present

## 2020-02-22 DIAGNOSIS — Z471 Aftercare following joint replacement surgery: Secondary | ICD-10-CM | POA: Diagnosis not present

## 2020-02-23 DIAGNOSIS — M25611 Stiffness of right shoulder, not elsewhere classified: Secondary | ICD-10-CM | POA: Diagnosis not present

## 2020-02-29 DIAGNOSIS — Z471 Aftercare following joint replacement surgery: Secondary | ICD-10-CM | POA: Diagnosis not present

## 2020-02-29 DIAGNOSIS — Z96611 Presence of right artificial shoulder joint: Secondary | ICD-10-CM | POA: Diagnosis not present

## 2020-02-29 DIAGNOSIS — M7062 Trochanteric bursitis, left hip: Secondary | ICD-10-CM | POA: Diagnosis not present

## 2020-03-08 DIAGNOSIS — M25611 Stiffness of right shoulder, not elsewhere classified: Secondary | ICD-10-CM | POA: Diagnosis not present

## 2020-03-09 ENCOUNTER — Other Ambulatory Visit: Payer: Self-pay | Admitting: Family Medicine

## 2020-03-10 ENCOUNTER — Other Ambulatory Visit: Payer: Self-pay | Admitting: Family Medicine

## 2020-03-10 DIAGNOSIS — M25611 Stiffness of right shoulder, not elsewhere classified: Secondary | ICD-10-CM | POA: Diagnosis not present

## 2020-03-17 DIAGNOSIS — M25611 Stiffness of right shoulder, not elsewhere classified: Secondary | ICD-10-CM | POA: Diagnosis not present

## 2020-03-18 DIAGNOSIS — S5011XA Contusion of right forearm, initial encounter: Secondary | ICD-10-CM | POA: Diagnosis not present

## 2020-03-18 DIAGNOSIS — S59911A Unspecified injury of right forearm, initial encounter: Secondary | ICD-10-CM | POA: Diagnosis not present

## 2020-03-18 DIAGNOSIS — Z23 Encounter for immunization: Secondary | ICD-10-CM | POA: Diagnosis not present

## 2020-03-18 DIAGNOSIS — I10 Essential (primary) hypertension: Secondary | ICD-10-CM | POA: Diagnosis not present

## 2020-03-18 DIAGNOSIS — S81012A Laceration without foreign body, left knee, initial encounter: Secondary | ICD-10-CM | POA: Diagnosis not present

## 2020-03-18 DIAGNOSIS — E7800 Pure hypercholesterolemia, unspecified: Secondary | ICD-10-CM | POA: Diagnosis not present

## 2020-03-18 DIAGNOSIS — S8991XA Unspecified injury of right lower leg, initial encounter: Secondary | ICD-10-CM | POA: Diagnosis not present

## 2020-03-18 DIAGNOSIS — W010XXA Fall on same level from slipping, tripping and stumbling without subsequent striking against object, initial encounter: Secondary | ICD-10-CM | POA: Diagnosis not present

## 2020-03-18 DIAGNOSIS — S8001XA Contusion of right knee, initial encounter: Secondary | ICD-10-CM | POA: Diagnosis not present

## 2020-03-22 ENCOUNTER — Encounter: Payer: Self-pay | Admitting: Family Medicine

## 2020-03-23 NOTE — Telephone Encounter (Signed)
Please have her schedule the appt for when she needs stitches out and we ill assess her then. Thanks

## 2020-03-25 ENCOUNTER — Inpatient Hospital Stay: Payer: BC Managed Care – PPO | Admitting: Oncology

## 2020-03-25 ENCOUNTER — Inpatient Hospital Stay: Payer: BC Managed Care – PPO

## 2020-04-01 ENCOUNTER — Ambulatory Visit: Payer: BC Managed Care – PPO | Admitting: Family Medicine

## 2020-04-01 ENCOUNTER — Encounter: Payer: Self-pay | Admitting: Family Medicine

## 2020-04-01 ENCOUNTER — Ambulatory Visit (INDEPENDENT_AMBULATORY_CARE_PROVIDER_SITE_OTHER): Payer: BC Managed Care – PPO | Admitting: Family Medicine

## 2020-04-01 ENCOUNTER — Other Ambulatory Visit: Payer: Self-pay

## 2020-04-01 ENCOUNTER — Ambulatory Visit: Payer: Self-pay

## 2020-04-01 VITALS — BP 122/74 | HR 82 | Temp 97.3°F | Ht 64.0 in | Wt 211.8 lb

## 2020-04-01 VITALS — BP 122/74 | HR 82 | Wt 211.6 lb

## 2020-04-01 DIAGNOSIS — M25561 Pain in right knee: Secondary | ICD-10-CM

## 2020-04-01 DIAGNOSIS — L02415 Cutaneous abscess of right lower limb: Secondary | ICD-10-CM

## 2020-04-01 DIAGNOSIS — M7041 Prepatellar bursitis, right knee: Secondary | ICD-10-CM

## 2020-04-01 DIAGNOSIS — M7110 Other infective bursitis, unspecified site: Secondary | ICD-10-CM | POA: Diagnosis not present

## 2020-04-01 MED ORDER — DOXYCYCLINE HYCLATE 100 MG PO TABS: 100.0000 mg | ORAL_TABLET | Freq: Two times a day (BID) | ORAL | 0 refills | Status: DC

## 2020-04-01 NOTE — Progress Notes (Signed)
Subjective:    I'm seeing this patient as a consultation for:  Dr. Yong [name] and Dr Morgan [name]. Note will be routed back to referring provider/PCP.  CC: R knee pain  I, Morgan [name], LAT, ATC, am serving as scribe for Dr. Lynne [name].  HPI: Pt is a 69 y/o female presenting w/ c/o R knee pain and swelling after suffering a fall 2 weeks ago.  She went to the [facility] ED near [facility] and received sutures in her L knee but only had some swelling and a visual "bump" on the R knee.  As of yesterday, pt reports getting redness and warmth in the R knee w/ redness and swelling running into her R lower leg.  She just saw her PCP earlier today who referred her to see Dr. Georgina [name] ASAP.    Past medical history, Surgical history, Family history, Social history, Allergies, and medications have been entered into the medical record, reviewed.   Review of Systems: No new headache, visual changes, nausea, vomiting, diarrhea, constipation, dizziness, abdominal pain, skin rash, fevers, chills, night sweats, weight loss, swollen lymph nodes, body aches, joint swelling, muscle aches, chest pain, shortness of breath, mood changes, visual or auditory hallucinations.   Objective:    Vitals:   04/01/20 1058  BP: 122/74  Pulse: 82  SpO2: 99%   General: Well Developed, well nourished, and in no acute distress.  Neuro/Psych: Alert and oriented x3, extra-ocular muscles intact, able to move all 4 extremities, sensation grossly intact. Skin: Warm and dry, no rashes noted.  Respiratory: Not using accessory muscles, speaking in full sentences, trachea midline.  Cardiovascular: Pulses palpable, no extremity edema. Abdomen: Does not appear distended. MSK: Left knee well-appearing laceration with suture repair at anterior left knee.  No wound erythema. Right knee large area of swelling anterior knee with erythema.  Tender to palpation.  Fluctuance palpated deeply. Normal knee motion.  Lab and Radiology  Results  Diagnostic Limited MSK Ultrasound of: Right knee Area of swelling is hypoechoic consistent with prepatellar bursitis. Impression: Right knee prepatellar bursitis   Prepatellar bursitis incision and drainage. Consent obtained and timeout performed. Skin cleaned with alcohol, and cold spray applied. 3 mL of lidocaine without epi injected achieving good anesthesia. Skin was again cleaned with alcohol.  An 18-gauge needle was used to access the cystic space.  Small amount of dark red fluid aspirated.  The fluid appeared to be quite thick and viscous consistent with clotted blood. Skin was again cleaned with rubbing alcohol. A sharp incision was made to the area of fluctuance. The incision was widened and clotted blood was expressed.  Clotted blood was cultured. Blunt dissection was used to break up loculations. Further clotted blood was expressed. Patient tolerated the procedure well. A dressing was applied.    Impression and Recommendations:    Assessment and Plan: 69 y.o. female with right knee prepatellar bursitis worsening recently.  Concerned that the sterile bursitis now has become septic based on worsening erythema pain and redness and swelling. Attempted to aspirate the bursa however the bursa sac was filled with clotted blood and not easily aspirated well.  Transition to a small incision and drainage.  Small amount of the clotted blood was sent for culture and patient was started empirically on doxycycline.  Recheck back with me in 1 week.  Precautions reviewed.  Tetanus up-to-date..   Orders Placed This Encounter  Procedures  . Wound culture    Standing Status:   Future    Standing  Expiration Date:   04/01/2021    Order Specific Question:   Source    Answer:   right knee septic prepatellar bursitis  . [place] LIMITED JOINT SPACE STRUCTURES LOW RIGHT(NO LINKED CHARGES)    Order Specific Question:   Reason for Exam (SYMPTOM  OR DIAGNOSIS REQUIRED)    Answer:   R knee pain     Order Specific Question:   Preferred imaging location?    Answer:   [place]   Meds ordered this encounter  Medications  . doxycycline (VIBRA-TABS) 100 MG tablet    Sig: Take 1 tablet (100 mg total) by mouth 2 (two) times daily for 7 days.    Dispense:  14 tablet    Refill:  0    Discussed warning signs or symptoms. Please see discharge instructions. Patient expresses understanding.   The above documentation has been reviewed and is accurate and complete Morgan [name]

## 2020-04-01 NOTE — Addendum Note (Signed)
Addended by: Marin Olp on: 04/01/2020 11:37 AM   Modules accepted: Orders

## 2020-04-01 NOTE — Patient Instructions (Addendum)
So sorry about your fall.   Keep your appointment on Monday with Dr. Jonni Sanger for Suture removal   If you start having any new symptoms (fever, chills, trouble breathing,  nausea or vomiting) go to ED.   Please go to Dr. Georgina Snell he will work you in now.   Address: [address], [place], [place] [address]

## 2020-04-01 NOTE — Addendum Note (Signed)
Addended by: Cresenciano Lick on: 04/01/2020 01:23 PM   Modules accepted: Orders

## 2020-04-01 NOTE — Progress Notes (Addendum)
Phone [phone] In person visit   Subjective:   Morgan [name] is a 69 y.o. year old very pleasant female patient who presents for/with See problem oriented charting Chief Complaint  Patient presents with  . Knot on knee    had fall 2 weeks ago    This visit occurred during the SARS-CoV-2 public health emergency.  Safety protocols were in place, including screening questions prior to the visit, additional usage of staff PPE, and extensive cleaning of exam room while observing appropriate contact time as indicated for disinfecting solutions.   Past Medical History-  Patient Active Problem List   Diagnosis Date Noted  . 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 Tear of right rotator cuff 07/14/2019  . Bilateral sensorineural hearing loss 02/02/2019  . Hypercalcemia 12/11/2018  . H/O partial resection of colon 01/31/2018  . Diverticulitis with abscess status post robotic low anterior rectosigmoid resection 12/11/2017 12/11/2017  . Rheumatoid arthritis involving multiple sites with positive rheumatoid factor ([place]) 10/31/2017  . Immunosuppressed status ([place]) 10/08/2017  . Obesity with body mass index 30 or greater 01/25/2017  . Current chronic use of systemic steroids 06/08/2016  . Uses hearing aid 12/09/2015  . Leukocytosis 08/27/2014  . Thrombocytopenia ([place]) 11/23/2013  . Fatty liver disease, nonalcoholic [id number]  . Benign colon polyp 10/20/2008  . Essential hypertension 10/20/2008  . Diverticulosis of colon 10/20/2008  . Mixed hyperlipidemia 04/23/2008  . VENOUS INSUFFICIENCY 04/23/2008  . Allergic rhinitis 04/23/2008  . Gastroesophageal reflux disease without esophagitis 04/23/2008    Medications- reviewed and updated Current Outpatient Medications  Medication Sig Dispense Refill  . acetaminophen (TYLENOL) 500 MG tablet Take 500 mg by mouth every 6 (six) hours as needed for moderate pain or headache.     Marland Kitchen aspirin 81 MG tablet Take 81 mg by mouth 3  (three) times a week.     . Calcium Carbonate-Vitamin D 600-400 MG-UNIT tablet Take 1 tablet by mouth 2 (two) times daily.     . Cholecalciferol (VITAMIN D) 50 MCG (2000 UT) CAPS Take 2,000 Units by mouth daily.     . COSENTYX SENSOREADY, 300 MG, 150 MG/ML SOAJ Inject 300 mg as directed once a week.    . doxepin (SINEQUAN) 10 MG capsule Take 10 mg by mouth 2 (two) times daily.     . fenofibrate 160 MG tablet TAKE 1 TABLET (160 MG TOTAL) BY MOUTH AT BEDTIME. 90 tablet 3  . fexofenadine (ALLEGRA) 180 MG tablet Take 180 mg by mouth daily.    . fluticasone (FLONASE) 50 MCG/ACT nasal spray Place 2 sprays into both nostrils daily as needed for allergies. 48 mL 2  . hydrochlorothiazide (HYDRODIURIL) 25 MG tablet TAKE 1 TABLET BY MOUTH EVERY DAY 90 tablet 3  . hydroxychloroquine (PLAQUENIL) 200 MG tablet Take 400 mg by mouth 2 (two) times daily.     . Hypromellose (ARTIFICIAL TEARS OP) Place 1 drop into both eyes daily as needed (for dry eyes).    . Multiple Vitamin (MULTIVITAMIN) tablet Take 1 tablet by mouth daily.      . predniSONE (DELTASONE) 5 MG tablet Take 5 mg by mouth daily.  3  . simvastatin (ZOCOR) 40 MG tablet TAKE 1 TABLET BY MOUTH EVERYDAY AT BEDTIME 90 tablet 3  . clobetasol ointment (TEMOVATE) 0.05 % APPLY TO AFFECTED AREA TWICE A DAY    . doxycycline (VIBRA-TABS) 100 MG tablet Take 1 tablet (100 mg total) by mouth 2 (two) times daily for 7 days. 14 tablet  0  . Golimumab (SIMPONI Wolfe) Inject into the skin.      No current facility-administered medications for this visit.     Objective:  BP 122/74   Pulse 82   Temp (!) 97.3 F (36.3 C) (Temporal)   Ht 5\' 4"  (1.626 m)   Wt 211 lb 12.8 oz (96.1 kg)   SpO2 99%   BMI 36.36 kg/m  Gen: NAD, resting comfortably  CV: RRR  Lungs: nonlabored, normal respiratory rate Abdomen: soft/nondistended       Assessment and Plan   # Knot on right knee/right knee pain S: Patient had fall two weeks ago. Went to ED for evaluation. Received  sutures in left knee. At time had bump on right knee.  This was stable in size over the last 2 weeks.   Then yesterday started getting red. Now warm to touch and very tender particularly on lower aspect. Denies any drainage. Redness is now going down into lower leg. She does take low dose ASA three times a week.  No fever/chills/body aches.  Patient does not feel ill overall. A/P: 69 year old female with rheumatoid arthritis on immunosuppressant presenting with erythema/warmth/tenderness over I believe the infrapatellar bursa-I am very concerned about septic bursitis.  Good range of motion of the knee-doubt septic arthritis.  I have spoken with Dr. [name] of with our sports medicine and he agrees to work patient in urgently this morning for complete work-up.  I do not suspect systemic illness at present.  In order to urgently get her to sports medicine we deferred doing any blood work.    Recommended follow up: Has follow-up visit with Dr. Jonni Sanger on Monday to remove sutures-we did discuss possibly removing these today the patient has already consulted with Dr. Jonni Sanger and wants to wait until Monday-I think that is reasonable Future Appointments  Date Time Provider [place]  04/04/2020  4:30 PM [name], MD [facility]  04/11/2020  9:00 AM Gregor Hams, MD [facility] None  04/28/2020  9:00 AM [facility] 2 [facility] None  04/28/2020  9:30 AM Wyatt Portela, MD [facility] None  05/31/2020  8:30 AM [name], MD [facility]    Lab/Order associations:   ICD-10-CM   1. Acute pain of right knee  M25.561 Ambulatory referral to Sports Medicine  2. Septic bursitis  M71.10 Ambulatory referral to Sports Medicine   Time Spent: 22 minutes of total time (10:16 AM- 10:38 AM) was spent on the date of the encounter performing the following actions: chart review prior to seeing the patient, obtaining history, performing a medically necessary exam, counseling on the treatment plan,  placing orders, and documenting in our EHR.   Return precautions advised.  Garret Reddish, MD

## 2020-04-01 NOTE — Patient Instructions (Signed)
Thank you for coming in today. Keep the wound compressed.  Keep the wound covered with ointment.  Recheck in about 1 week.  Start doxycycline twice daily.  Do not take it with calcium or iron.  OK to take with a little.    Skin Abscess  A skin abscess is an infected area on or under your skin that contains a collection of pus and other material. An abscess may also be called a furuncle, carbuncle, or boil. An abscess can occur in or on almost any part of your body. Some abscesses break open (rupture) on their own. Most continue to get worse unless they are treated. The infection can spread deeper into the body and eventually into your blood, which can make you feel ill. Treatment usually involves draining the abscess. What are the causes? An abscess occurs when germs, like bacteria, pass through your skin and cause an infection. This may be caused by:  A scrape or cut on your skin.  A puncture wound through your skin, including a needle injection or insect bite.  Blocked oil or sweat glands.  Blocked and infected hair follicles.  A cyst that forms beneath your skin (sebaceous cyst) and becomes infected. What increases the risk? This condition is more likely to develop in people who:  Have a weak body defense system (immune system).  Have diabetes.  Have dry and irritated skin.  Get frequent injections or use illegal IV drugs.  Have a foreign body in a wound, such as a splinter.  Have problems with their lymph system or veins. What are the signs or symptoms? Symptoms of this condition include:  A painful, firm bump under the skin.  A bump with pus at the top. This may break through the skin and drain. Other symptoms include:  Redness surrounding the abscess site.  Warmth.  Swelling of the lymph nodes (glands) near the abscess.  Tenderness.  A sore on the skin. How is this diagnosed? This condition may be diagnosed based on:  A physical exam.  Your medical  history.  A sample of pus. This may be used to find out what is causing the infection.  Blood tests.  Imaging tests, such as an ultrasound, CT scan, or MRI. How is this treated? A small abscess that drains on its own may not need treatment. Treatment for larger abscesses may include:  Moist heat or heat pack applied to the area several times a day.  A procedure to drain the abscess (incision and drainage).  Antibiotic medicines. For a severe abscess, you may first get antibiotics through an IV and then change to antibiotics by mouth. Follow these instructions at home: Medicines   Take over-the-counter and prescription medicines only as told by your health care provider.  If you were prescribed an antibiotic medicine, take it as told by your health care provider. Do not stop taking the antibiotic even if you start to feel better. Abscess care   If you have an abscess that has not drained, apply heat to the affected area. Use the heat source that your health care provider recommends, such as a moist heat pack or a heating pad. ? Place a towel between your skin and the heat source. ? Leave the heat on for 20-30 minutes. ? Remove the heat if your skin turns bright red. This is especially important if you are unable to feel pain, heat, or cold. You may have a greater risk of getting burned.  Follow instructions from your  health care provider about how to take care of your abscess. Make sure you: ? Cover the abscess with a bandage (dressing). ? Change your dressing or gauze as told by your health care provider. ? Wash your hands with soap and water before you change the dressing or gauze. If soap and water are not available, use hand sanitizer.  Check your abscess every day for signs of a worsening infection. Check for: ? More redness, swelling, or pain. ? More fluid or blood. ? Warmth. ? More pus or a bad smell. General instructions  To avoid spreading the infection: ? Do not  share personal care items, towels, or hot tubs with others. ? Avoid making skin contact with other people.  Keep all follow-up visits as told by your health care provider. This is important. Contact a health care provider if you have:  More redness, swelling, or pain around your abscess.  More fluid or blood coming from your abscess.  Warm skin around your abscess.  More pus or a bad smell coming from your abscess.  A fever.  Muscle aches.  Chills or a general ill feeling. Get help right away if you:  Have severe pain.  See red streaks on your skin spreading away from the abscess. Summary  A skin abscess is an infected area on or under your skin that contains a collection of pus and other material.  A small abscess that drains on its own may not need treatment.  Treatment for larger abscesses may include having a procedure to drain the abscess and taking an antibiotic. This information is not intended to replace advice given to you by your health care provider. Make sure you discuss any questions you have with your health care provider. Document Revised: 03/12/2019 Document Reviewed: 01/02/2018 Elsevier Patient Education  [place] Bursitis  Prepatellar bursitis is inflammation of the prepatellar bursa, which is a fluid-filled sac that cushions the kneecap (patella). Prepatellar bursitis happens when fluid builds up in this sac and causes it to swell. The condition causes knee pain. What are the causes? This condition may be caused by:  Constant pressure on the knees from kneeling.  A hit to the knee.  Falling on the knee.  Infection from bacteria.  Moving the knee often in a forceful way. What increases the risk? You are more likely to develop this condition if:  You play sports that have a high risk of falling on the knee or being hit on the knee. These include football, wrestling, basketball, or soccer.  You do work in which you kneel  for long periods of time, such as roofing, plumbing, or gardening.  You have another inflammatory condition, such as gout or rheumatoid arthritis. What are the signs or symptoms? The most common symptom of this condition is knee pain that gets better with rest. Other symptoms include:  Swelling on the front of the kneecap.  Warmth in the knee.  Tenderness with activity.  Redness in the knee.  Inability to bend the knee or to kneel. How is this diagnosed? This condition is diagnosed based on:  A physical exam. Your health care provider will compare your knees and check for tenderness and pain while moving your knee.  Your medical history.  Tests to check for infection. These may include blood tests and tests on the fluid in the bursa.  Imaging tests, such as X-ray, MRI, or ultrasound, to check for damage in the patella, or fluid buildup and  swelling in the bursa. How is this treated? This condition may be treated by:  Resting the knee.  Putting ice on the knee.  Taking medicines, such as: ? NSAIDs. These can help to reduce pain and swelling. ? Antibiotics. These may be needed if you have an infection. ? Steroids. These are used to reduce swelling and inflammation, and may be prescribed if other treatments are not helping.  Raising (elevating) the knee while resting.  Doing exercises to help you maintain movement (physical therapy). These may be recommended after pain and swelling improve.  Having a procedure to remove fluid from the bursa. This may be done if other treatments are not helping.  Having surgery to remove the bursa. This may be done if you have a severe infection or if the condition keeps coming back after treatment. Follow these instructions at home: Medicines  Take over-the-counter and prescription medicines only as told by your health care provider.  If you were prescribed an antibiotic medicine, take it as told by your health care provider. Do not stop  taking the antibiotic even if you start to feel better. Managing pain, stiffness, and swelling   If directed, put ice on the injured area. ? Put ice in a plastic bag. ? Place a towel between your skin and the bag. ? Leave the ice on for 20 minutes, 2-3 times a day.  Elevate the injured area above the level of your heart while you are sitting or lying down. Activity  Do not use the injured limb to support your body weight until your health care provider says that you can.  Rest your knee.  Avoid activities that cause knee pain.  Return to your normal activities as told by your health care provider. Ask your health care provider what activities are safe for you.  Do exercises as told by your health care provider. General instructions  Ask your health care provider when it is safe for you to drive.  Do not use any products that contain nicotine or tobacco, such as cigarettes, e-cigarettes, and chewing tobacco. These can delay healing. If you need help quitting, ask your health care provider.  Keep all follow-up visits as told by your health care provider. This is important. How is this prevented?  Warm up and stretch before being active.  Cool down and stretch after being active.  Give your body time to rest between periods of activity.  Maintain physical fitness, including strength and flexibility.  Be safe and responsible while being active. This will help you to avoid falls.  Wear knee pads if you have to kneel for a long period of time. Contact a health care provider if:  Your symptoms do not improve or get worse.  Your symptoms keep coming back after treatment.  You develop a fever and have warmth, redness, or swelling over your knee. Summary  Prepatellar bursitis is inflammation of the prepatellar bursa, which is a fluid-filled sac that cushions the kneecap (patella).  This condition may be caused by injury or constant pressure on the knee. It may also be caused  by an infection from bacteria.  Symptoms of this condition include pain, swelling, warmth, and tenderness in the knee.  Follow instructions from your health care provider about taking medicines, resting, and doing activities.  Contact your health care provider if your symptoms do not improve, get worse, or keep coming back after treatment. This information is not intended to replace advice given to you by your health care  provider. Make sure you discuss any questions you have with your health care provider. Document Revised: 03/13/2019 Document Reviewed: 01/29/2019 Elsevier Patient Education  [place].

## 2020-04-04 ENCOUNTER — Encounter: Payer: Self-pay | Admitting: Family Medicine

## 2020-04-04 ENCOUNTER — Other Ambulatory Visit: Payer: Self-pay

## 2020-04-04 ENCOUNTER — Ambulatory Visit (INDEPENDENT_AMBULATORY_CARE_PROVIDER_SITE_OTHER): Payer: BC Managed Care – PPO | Admitting: Family Medicine

## 2020-04-04 VITALS — BP 128/80 | HR 92 | Temp 97.3°F | Resp 18 | Ht 64.0 in | Wt 213.2 lb

## 2020-04-04 DIAGNOSIS — L089 Local infection of the skin and subcutaneous tissue, unspecified: Secondary | ICD-10-CM

## 2020-04-04 DIAGNOSIS — D849 Immunodeficiency, unspecified: Secondary | ICD-10-CM

## 2020-04-04 DIAGNOSIS — T148XXA Other injury of unspecified body region, initial encounter: Secondary | ICD-10-CM

## 2020-04-04 DIAGNOSIS — S51801D Unspecified open wound of right forearm, subsequent encounter: Secondary | ICD-10-CM | POA: Diagnosis not present

## 2020-04-04 DIAGNOSIS — M7041 Prepatellar bursitis, right knee: Secondary | ICD-10-CM | POA: Diagnosis not present

## 2020-04-04 LAB — WOUND CULTURE
GRAM STAIN:: NONE SEEN
MICRO NUMBER:: 10425834
RESULT:: NO GROWTH
SPECIMEN QUALITY:: ADEQUATE

## 2020-04-04 MED ORDER — AMOXICILLIN-POT CLAVULANATE 875-125 MG PO TABS: 1.0000 | ORAL_TABLET | Freq: Two times a day (BID) | ORAL | 0 refills | Status: DC

## 2020-04-04 NOTE — Patient Instructions (Signed)
Please return in 3-4 days.  Stop the doxy for now. Start augmentin twice daily starting tonight.   Bandage the knees during the day and leave open to air for a few hours every night.  Stop the vaseline or antibacterial ointments.  Use paper tape when you can.  Ice twice a day (the knees).  If you have any questions or concerns, please don't hesitate to send me a message via MyChart or call the office at [phone]. Thank you for visiting with [place] today! It's our pleasure caring for you.

## 2020-04-04 NOTE — Progress Notes (Signed)
Subjective  CC:  Chief Complaint  Patient presents with  . Knee Injury    Patient mentioned that when she was sitting at her desk working she noticed blood running down her leg. She stated that its alot more swollen than it has been.     HPI: Morgan [name] is a 69 y.o. female who presents to the office today to address the problems listed above in the chief complaint.  Pt feel approx 2 weeks sustaining wounds to right forearm, right knee, left knee: evaluated and treated in ER and received sutures for left knee lac and neg xrays. Since, had pain and swelling or right knee: evaluated here and at sports med several days ago; I reviewed those notes as well. Started on abx for presumed infection; prepatellar bursa with evacuation of old clot.  Since, culture has been negative. On doxy with decreasing redness of right knee but had propuslive bleeding once scab released over the weekend and today left knee with bleeding from wound site and more redness. No more trauma. Has been restful. No fevers or systemic sxs.  No visits with results within 1 Day(s) from this visit.  Latest known visit with results is:  Office Visit on 04/01/2020  Component Date Value Ref Range Status  . MICRO NUMBER: 04/01/2020 [id number]   Final  . SPECIMEN QUALITY: 04/01/2020 Adequate   Final  . SOURCE: 04/01/2020 KNEE, RIGHT   Final  . STATUS: 04/01/2020 FINAL   Final  . GRAM STAIN: 04/01/2020 No organisms or white blood cells seen   Final  . RESULT: 04/01/2020 No Growth   Final    Assessment  1. Wound infection, posttraumatic   2. Hemorrhagic prepatellar bursitis of right knee   3. Open wound of right forearm, subsequent encounter   4. Immunosuppressed status ([place])      Plan   Left knee wound infection:  Change to augmentin. Stop aspirin due to bleeding. Discussed wound care. Due to running sutures, did not release. Will recheck in 3-4 days. No abscess.   Prepatellar bursitis, traumatic and hemorrhagic: stop  aspirin. Improving. Mild pressure dressing. Ice  Right forearm: duoderm applied to skin tear. No infection present.   Follow up: 3-4 days for recheck     No orders of the defined types were placed in this encounter.  Meds ordered this encounter  Medications  . amoxicillin-clavulanate (AUGMENTIN) 875-125 MG tablet    Sig: Take 1 tablet by mouth 2 (two) times daily.    Dispense:  14 tablet    Refill:  0      I reviewed the patients updated PMH, FH, and SocHx.    Patient Active Problem List   Diagnosis Date Noted  . 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 Tear of right rotator cuff 07/14/2019  . Bilateral sensorineural hearing loss 02/02/2019  . Hypercalcemia 12/11/2018  . H/O partial resection of colon 01/31/2018  . Diverticulitis with abscess status post robotic low anterior rectosigmoid resection 12/11/2017 12/11/2017  . Rheumatoid arthritis involving multiple sites with positive rheumatoid factor ([place]) 10/31/2017  . Immunosuppressed status ([place]) 10/08/2017  . Obesity with body mass index 30 or greater 01/25/2017  . Current chronic use of systemic steroids 06/08/2016  . Uses hearing aid 12/09/2015  . Leukocytosis 08/27/2014  . Thrombocytopenia ([place]) 11/23/2013  . Fatty liver disease, nonalcoholic [id number]  . Benign colon polyp 10/20/2008  . Essential hypertension 10/20/2008  . Diverticulosis of colon 10/20/2008  . Mixed hyperlipidemia 04/23/2008  .  VENOUS INSUFFICIENCY 04/23/2008  . Allergic rhinitis 04/23/2008  . Gastroesophageal reflux disease without esophagitis 04/23/2008   Current Meds  Medication Sig  . acetaminophen (TYLENOL) 500 MG tablet Take 500 mg by mouth every 6 (six) hours as needed for moderate pain or headache.   Marland Kitchen aspirin 81 MG tablet Take 81 mg by mouth 3 (three) times a week.   . Calcium Carbonate-Vitamin D 600-400 MG-UNIT tablet Take 1 tablet by mouth 2 (two) times daily.   . Cholecalciferol (VITAMIN D) 50 MCG (2000 UT) CAPS  Take 2,000 Units by mouth daily.   . clobetasol ointment (TEMOVATE) 0.05 % APPLY TO AFFECTED AREA TWICE A DAY  . COSENTYX SENSOREADY, 300 MG, 150 MG/ML SOAJ Inject 300 mg as directed once a week.  . doxepin (SINEQUAN) 10 MG capsule Take 10 mg by mouth 2 (two) times daily.   . fenofibrate 160 MG tablet TAKE 1 TABLET (160 MG TOTAL) BY MOUTH AT BEDTIME.  . fexofenadine (ALLEGRA) 180 MG tablet Take 180 mg by mouth daily.  . fluticasone (FLONASE) 50 MCG/ACT nasal spray Place 2 sprays into both nostrils daily as needed for allergies.  . Golimumab (SIMPONI [place]) Inject into the skin.   . hydrochlorothiazide (HYDRODIURIL) 25 MG tablet TAKE 1 TABLET BY MOUTH EVERY DAY  . hydroxychloroquine (PLAQUENIL) 200 MG tablet Take 400 mg by mouth 2 (two) times daily.   . Hypromellose (ARTIFICIAL TEARS OP) Place 1 drop into both eyes daily as needed (for dry eyes).  . Multiple Vitamin (MULTIVITAMIN) tablet Take 1 tablet by mouth daily.    . predniSONE (DELTASONE) 5 MG tablet Take 5 mg by mouth daily.  . simvastatin (ZOCOR) 40 MG tablet TAKE 1 TABLET BY MOUTH EVERYDAY AT BEDTIME  . [DISCONTINUED] doxycycline (VIBRA-TABS) 100 MG tablet Take 1 tablet (100 mg total) by mouth 2 (two) times daily for 7 days.    Allergies: Patient is allergic to remicade [infliximab]. Family History: Patient family history includes Arthritis in her mother; Atrial fibrillation in her mother; Breast cancer in an other family member; COPD in her mother; Diabetes in her maternal aunt, maternal uncle, and sister; Healthy in her sister; Heart disease in her mother and sister; Heart failure in her brother and son; Hyperlipidemia in her mother; Hypertension in her mother; Liver disease in her sister. Social History:  Patient  reports that she quit smoking about 18 years ago. Her smoking use included cigarettes. She has a 7.50 pack-year smoking history. She has never used smokeless tobacco. She reports current alcohol use. She reports that she does  not use drugs.  Review of Systems: Constitutional: Negative for fever malaise or anorexia Cardiovascular: negative for chest pain Respiratory: negative for SOB or persistent cough Gastrointestinal: negative for abdominal pain  Objective  Vitals: BP 128/80   Pulse 92   Temp (!) 97.3 F (36.3 C) (Temporal)   Resp 18   Ht 5\' 4"  (1.626 m)   Wt 213 lb 3.2 oz (96.7 kg)   SpO2 98%   BMI 36.60 kg/m  General: no acute distress , A&Ox3 Respiratory:  Good breath sounds bilaterally, CTAB with normal respiratory effort Skin:  Warm, no rashes, multiple ecchymosis on forearms, thin skin (steroid dermopathy). Right volar forearm with 2x3" open wound/skin tear w/ clear edges, no pus, no active bleeding Right knee: prepatellar bursa is warm and swollen with scab at puncture site present, mild oozing blood. No pus. Minimal ttp.  Left knee: 3-4 inch wound with running sutures in place; edges are approximated  but not healed (sutures placed 4/16). Red macerated edges in right side w/ some serosanguinous drainage present. No frank pus or fluctuance. Mild surrounding erythema. FROM of knee.      Commons side effects, risks, benefits, and alternatives for medications and treatment plan prescribed today were discussed, and the patient expressed understanding of the given instructions. Patient is instructed to call or message via MyChart if he/she has any questions or concerns regarding our treatment plan. No barriers to understanding were identified. We discussed Red Flag symptoms and signs in detail. Patient expressed understanding regarding what to do in case of urgent or emergency type symptoms.   Medication list was reconciled, printed and provided to the patient in AVS. Patient instructions and summary information was reviewed with the patient as documented in the AVS. This note was prepared with assistance of Dragon voice recognition software. Occasional wrong-word or sound-a-like substitutions may have  occurred due to the inherent limitations of voice recognition software  This visit occurred during the SARS-CoV-2 public health emergency.  Safety protocols were in place, including screening questions prior to the visit, additional usage of staff PPE, and extensive cleaning of exam room while observing appropriate contact time as indicated for disinfecting solutions.

## 2020-04-04 NOTE — Progress Notes (Signed)
Wound culture is not growing any bacteria so far

## 2020-04-05 ENCOUNTER — Encounter: Payer: Self-pay | Admitting: Family Medicine

## 2020-04-07 ENCOUNTER — Other Ambulatory Visit: Payer: Self-pay

## 2020-04-07 ENCOUNTER — Ambulatory Visit (INDEPENDENT_AMBULATORY_CARE_PROVIDER_SITE_OTHER): Payer: BC Managed Care – PPO | Admitting: Family Medicine

## 2020-04-07 ENCOUNTER — Encounter: Payer: Self-pay | Admitting: Family Medicine

## 2020-04-07 VITALS — BP 118/64 | HR 72 | Temp 97.6°F | Resp 16

## 2020-04-07 DIAGNOSIS — D849 Immunodeficiency, unspecified: Secondary | ICD-10-CM | POA: Diagnosis not present

## 2020-04-07 DIAGNOSIS — M7041 Prepatellar bursitis, right knee: Secondary | ICD-10-CM | POA: Diagnosis not present

## 2020-04-07 DIAGNOSIS — S51801D Unspecified open wound of right forearm, subsequent encounter: Secondary | ICD-10-CM | POA: Diagnosis not present

## 2020-04-07 DIAGNOSIS — T148XXA Other injury of unspecified body region, initial encounter: Secondary | ICD-10-CM

## 2020-04-07 DIAGNOSIS — L089 Local infection of the skin and subcutaneous tissue, unspecified: Secondary | ICD-10-CM

## 2020-04-07 NOTE — Progress Notes (Signed)
Subjective  CC:  Chief Complaint  Patient presents with  . Wound Check    patient states she has been icing wound, thinks it is improving    HPI: Morgan [name] is a 69 y.o. female who presents to the office today to address the problems listed above in the chief complaint.  See last note  On augmentin and tolerating well. Less pus/drainage from knee wounds. No f/c/s. Sleeping better. Feeling better.    Assessment  1. Wound infection, posttraumatic   2. Hemorrhagic prepatellar bursitis of right knee   3. Open wound of right forearm, subsequent encounter   4. Immunosuppressed status ([place])      Plan   Wound infection, left knee:  Removed sutures and placed steri strips. Left 1" area open for drainage of infection; overall improving. Wound care again discussed. Complete augmentin  Right knee bursitis almost completely resolved. Hold asa another week  Right forearm wound; duoderm if they have it. If not, daily dressing changes  Follow up: Return in about 1 week (around 04/14/2020) for recheck.  05/31/2020  No orders of the defined types were placed in this encounter.  No orders of the defined types were placed in this encounter.     I reviewed the patients updated PMH, FH, and SocHx.    Patient Active Problem List   Diagnosis Date Noted  . 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 Tear of right rotator cuff 07/14/2019  . Bilateral sensorineural hearing loss 02/02/2019  . Hypercalcemia 12/11/2018  . H/O partial resection of colon 01/31/2018  . Diverticulitis with abscess status post robotic low anterior rectosigmoid resection 12/11/2017 12/11/2017  . Rheumatoid arthritis involving multiple sites with positive rheumatoid factor ([place]) 10/31/2017  . Immunosuppressed status ([place]) 10/08/2017  . Obesity with body mass index 30 or greater 01/25/2017  . Current chronic use of systemic steroids 06/08/2016  . Uses hearing aid 12/09/2015  . Leukocytosis  08/27/2014  . Thrombocytopenia ([place]) 11/23/2013  . Fatty liver disease, nonalcoholic [id number]  . Benign colon polyp 10/20/2008  . Essential hypertension 10/20/2008  . Diverticulosis of colon 10/20/2008  . Mixed hyperlipidemia 04/23/2008  . VENOUS INSUFFICIENCY 04/23/2008  . Allergic rhinitis 04/23/2008  . Gastroesophageal reflux disease without esophagitis 04/23/2008   Current Meds  Medication Sig  . acetaminophen (TYLENOL) 500 MG tablet Take 500 mg by mouth every 6 (six) hours as needed for moderate pain or headache.   Marland Kitchen amoxicillin-clavulanate (AUGMENTIN) 875-125 MG tablet Take 1 tablet by mouth 2 (two) times daily.  Marland Kitchen aspirin 81 MG tablet Take 81 mg by mouth 3 (three) times a week.   . Calcium Carbonate-Vitamin D 600-400 MG-UNIT tablet Take 1 tablet by mouth 2 (two) times daily.   . Cholecalciferol (VITAMIN D) 50 MCG (2000 UT) CAPS Take 2,000 Units by mouth daily.   . clobetasol ointment (TEMOVATE) 0.05 % APPLY TO AFFECTED AREA TWICE A DAY  . COSENTYX SENSOREADY, 300 MG, 150 MG/ML SOAJ Inject 300 mg as directed once a week.  . doxepin (SINEQUAN) 10 MG capsule Take 10 mg by mouth 2 (two) times daily.   . fenofibrate 160 MG tablet TAKE 1 TABLET (160 MG TOTAL) BY MOUTH AT BEDTIME.  . fexofenadine (ALLEGRA) 180 MG tablet Take 180 mg by mouth daily.  . fluticasone (FLONASE) 50 MCG/ACT nasal spray Place 2 sprays into both nostrils daily as needed for allergies.  . Golimumab (SIMPONI [place]) Inject into the skin.   . hydrochlorothiazide (HYDRODIURIL) 25 MG tablet TAKE  1 TABLET BY MOUTH EVERY DAY  . hydroxychloroquine (PLAQUENIL) 200 MG tablet Take 400 mg by mouth 2 (two) times daily.   . Hypromellose (ARTIFICIAL TEARS OP) Place 1 drop into both eyes daily as needed (for dry eyes).  . Multiple Vitamin (MULTIVITAMIN) tablet Take 1 tablet by mouth daily.    . predniSONE (DELTASONE) 5 MG tablet Take 5 mg by mouth daily.  . simvastatin (ZOCOR) 40 MG tablet TAKE 1 TABLET BY MOUTH EVERYDAY AT  BEDTIME    Allergies: Patient is allergic to remicade [infliximab]. Family History: Patient family history includes Arthritis in her mother; Atrial fibrillation in her mother; Breast cancer in an other family member; COPD in her mother; Diabetes in her maternal aunt, maternal uncle, and sister; Healthy in her sister; Heart disease in her mother and sister; Heart failure in her brother and son; Hyperlipidemia in her mother; Hypertension in her mother; Liver disease in her sister. Social History:  Patient  reports that she quit smoking about 18 years ago. Her smoking use included cigarettes. She has a 7.50 pack-year smoking history. She has never used smokeless tobacco. She reports current alcohol use. She reports that she does not use drugs.  Review of Systems: Constitutional: Negative for fever malaise or anorexia Cardiovascular: negative for chest pain Respiratory: negative for SOB or persistent cough Gastrointestinal: negative for abdominal pain  Objective  Vitals: BP 118/64   Pulse 72   Temp 97.6 F (36.4 C) (Temporal)   Resp 16   SpO2 98%  General: no acute distress , A&Ox3 Right forearm: tegaderm over wound with accumulated old brownish blood. Surround skin is clear  Right knee: scabbed lesion present over incision site. Minimal redness now.  Left knee: medial half of incision is clear and closed; lateral half with less pus drainage, still with surrounding erythema and ttp. No fluctuance.   Left knee incision was cleansed with betadine and running sutures were removed in routine fashion. steristrips applied to medial wound. Bandage placed.     Commons side effects, risks, benefits, and alternatives for medications and treatment plan prescribed today were discussed, and the patient expressed understanding of the given instructions. Patient is instructed to call or message via MyChart if he/she has any questions or concerns regarding our treatment plan. No barriers to understanding  were identified. We discussed Red Flag symptoms and signs in detail. Patient expressed understanding regarding what to do in case of urgent or emergency type symptoms.   Medication list was reconciled, printed and provided to the patient in AVS. Patient instructions and summary information was reviewed with the patient as documented in the AVS. This note was prepared with assistance of Dragon voice recognition software. Occasional wrong-word or sound-a-like substitutions may have occurred due to the inherent limitations of voice recognition software  This visit occurred during the SARS-CoV-2 public health emergency.  Safety protocols were in place, including screening questions prior to the visit, additional usage of staff PPE, and extensive cleaning of exam room while observing appropriate contact time as indicated for disinfecting solutions.

## 2020-04-11 ENCOUNTER — Ambulatory Visit: Payer: BC Managed Care – PPO | Admitting: Family Medicine

## 2020-04-11 ENCOUNTER — Encounter: Payer: Self-pay | Admitting: Family Medicine

## 2020-04-12 ENCOUNTER — Other Ambulatory Visit: Payer: Self-pay | Admitting: Family Medicine

## 2020-04-12 DIAGNOSIS — Z1231 Encounter for screening mammogram for malignant neoplasm of breast: Secondary | ICD-10-CM

## 2020-04-12 DIAGNOSIS — B353 Tinea pedis: Secondary | ICD-10-CM | POA: Diagnosis not present

## 2020-04-12 DIAGNOSIS — L859 Epidermal thickening, unspecified: Secondary | ICD-10-CM | POA: Diagnosis not present

## 2020-04-12 DIAGNOSIS — L570 Actinic keratosis: Secondary | ICD-10-CM | POA: Diagnosis not present

## 2020-04-15 ENCOUNTER — Encounter: Payer: Self-pay | Admitting: Family Medicine

## 2020-04-15 ENCOUNTER — Ambulatory Visit (INDEPENDENT_AMBULATORY_CARE_PROVIDER_SITE_OTHER): Payer: BC Managed Care – PPO | Admitting: Family Medicine

## 2020-04-15 ENCOUNTER — Other Ambulatory Visit: Payer: Self-pay

## 2020-04-15 VITALS — BP 130/76 | HR 71 | Temp 97.6°F | Resp 18 | Ht 64.0 in | Wt 212.4 lb

## 2020-04-15 DIAGNOSIS — T148XXA Other injury of unspecified body region, initial encounter: Secondary | ICD-10-CM

## 2020-04-15 DIAGNOSIS — S51801D Unspecified open wound of right forearm, subsequent encounter: Secondary | ICD-10-CM | POA: Diagnosis not present

## 2020-04-15 DIAGNOSIS — M7041 Prepatellar bursitis, right knee: Secondary | ICD-10-CM

## 2020-04-15 DIAGNOSIS — D849 Immunodeficiency, unspecified: Secondary | ICD-10-CM

## 2020-04-15 DIAGNOSIS — L089 Local infection of the skin and subcutaneous tissue, unspecified: Secondary | ICD-10-CM

## 2020-04-15 MED ORDER — DOXYCYCLINE HYCLATE 100 MG PO TABS
100.0000 mg | ORAL_TABLET | Freq: Two times a day (BID) | ORAL | 0 refills | Status: DC
Start: 2020-04-15 — End: 2020-04-28

## 2020-04-17 ENCOUNTER — Encounter: Payer: Self-pay | Admitting: Family Medicine

## 2020-04-17 NOTE — Progress Notes (Signed)
Subjective  CC:  Chief Complaint  Patient presents with  . Wound Check    Pain has improved. Feels a little better. Some drainage present.     HPI: Morgan [name] is a 69 y.o. female who presents to the office today to address the problems listed above in the chief complaint.  Left knee wound: still draining a pink clear fluid. Edges are more red. Less pain. No systemic fevers.   Right knee wound still puffy  Right arm wound: doing better    Assessment  1. Wound infection, posttraumatic   2. Hemorrhagic prepatellar bursitis of right knee   3. Open wound of right forearm, subsequent encounter   4. Immunosuppressed status ([place])      Plan   wounds:  Restart doxy for coverage of infection for left knee given immunosuppressed status and still draining. Wound care for other wounds. Improving.   Follow up: 2 weeks recheck  04/29/2020  No orders of the defined types were placed in this encounter.  Meds ordered this encounter  Medications  . doxycycline (VIBRA-TABS) 100 MG tablet    Sig: Take 1 tablet (100 mg total) by mouth 2 (two) times daily for 7 days.    Dispense:  14 tablet    Refill:  0      I reviewed the patients updated PMH, FH, and SocHx.    Patient Active Problem List   Diagnosis Date Noted  . 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 Tear of right rotator cuff 07/14/2019  . Bilateral sensorineural hearing loss 02/02/2019  . Hypercalcemia 12/11/2018  . H/O partial resection of colon 01/31/2018  . Diverticulitis with abscess status post robotic low anterior rectosigmoid resection 12/11/2017 12/11/2017  . Rheumatoid arthritis involving multiple sites with positive rheumatoid factor ([place]) 10/31/2017  . Immunosuppressed status ([place]) 10/08/2017  . Obesity with body mass index 30 or greater 01/25/2017  . Current chronic use of systemic steroids 06/08/2016  . Uses hearing aid 12/09/2015  . Leukocytosis 08/27/2014  . Thrombocytopenia ([place])  11/23/2013  . Fatty liver disease, nonalcoholic [id number]  . Benign colon polyp 10/20/2008  . Essential hypertension 10/20/2008  . Diverticulosis of colon 10/20/2008  . Mixed hyperlipidemia 04/23/2008  . VENOUS INSUFFICIENCY 04/23/2008  . Allergic rhinitis 04/23/2008  . Gastroesophageal reflux disease without esophagitis 04/23/2008   Current Meds  Medication Sig  . acetaminophen (TYLENOL) 500 MG tablet Take 500 mg by mouth every 6 (six) hours as needed for moderate pain or headache.   . Calcium Carbonate-Vitamin D 600-400 MG-UNIT tablet Take 1 tablet by mouth 2 (two) times daily.   . Cholecalciferol (VITAMIN D) 50 MCG (2000 UT) CAPS Take 2,000 Units by mouth daily.   . clobetasol ointment (TEMOVATE) 0.05 % as needed.   . COSENTYX SENSOREADY, 300 MG, 150 MG/ML SOAJ Inject 300 mg as directed. Once every 4 weeks  . doxepin (SINEQUAN) 10 MG capsule Take 10 mg by mouth 2 (two) times daily.   . fenofibrate 160 MG tablet TAKE 1 TABLET (160 MG TOTAL) BY MOUTH AT BEDTIME.  . fexofenadine (ALLEGRA) 180 MG tablet Take 180 mg by mouth daily.  . fluticasone (FLONASE) 50 MCG/ACT nasal spray Place 2 sprays into both nostrils daily as needed for allergies.  . hydrochlorothiazide (HYDRODIURIL) 25 MG tablet TAKE 1 TABLET BY MOUTH EVERY DAY  . hydroxychloroquine (PLAQUENIL) 200 MG tablet Take 400 mg by mouth 2 (two) times daily.   . Hypromellose (ARTIFICIAL TEARS OP) Place 1 drop into both  eyes daily as needed (for dry eyes).  . Multiple Vitamin (MULTIVITAMIN) tablet Take 1 tablet by mouth daily.    . predniSONE (DELTASONE) 5 MG tablet Take 5 mg by mouth daily.  . simvastatin (ZOCOR) 40 MG tablet TAKE 1 TABLET BY MOUTH EVERYDAY AT BEDTIME    Allergies: Patient is allergic to remicade [infliximab]. Family History: Patient family history includes Arthritis in her mother; Atrial fibrillation in her mother; Breast cancer in an other family member; COPD in her mother; Diabetes in her maternal aunt, maternal  uncle, and sister; Healthy in her sister; Heart disease in her mother and sister; Heart failure in her brother and son; Hyperlipidemia in her mother; Hypertension in her mother; Liver disease in her sister. Social History:  Patient  reports that she quit smoking about 18 years ago. Her smoking use included cigarettes. She has a 7.50 pack-year smoking history. She has never used smokeless tobacco. She reports current alcohol use. She reports that she does not use drugs.  Review of Systems: Constitutional: Negative for fever malaise or anorexia Cardiovascular: negative for chest pain Respiratory: negative for SOB or persistent cough Gastrointestinal: negative for abdominal pain  Objective  Vitals: BP 130/76   Pulse 71   Temp 97.6 F (36.4 C) (Temporal)   Resp 18   Ht 5\' 4"  (1.626 m)   Wt 212 lb 6.4 oz (96.3 kg)   SpO2 98%   BMI 36.46 kg/m  Left knee: medial 1/3 wound closed and clean with steri-strips intact; lateral 2/3 wound with yellowish exudate and ecchymotic wound edges. No fluctuance or warmth, + tender Right knee: incision site remains open w/o infection Right forearm skin tear/wound:clean pink granulation tissue w/o bleeding    Commons side effects, risks, benefits, and alternatives for medications and treatment plan prescribed today were discussed, and the patient expressed understanding of the given instructions. Patient is instructed to call or message via MyChart if he/she has any questions or concerns regarding our treatment plan. No barriers to understanding were identified. We discussed Red Flag symptoms and signs in detail. Patient expressed understanding regarding what to do in case of urgent or emergency type symptoms.   Medication list was reconciled, printed and provided to the patient in AVS. Patient instructions and summary information was reviewed with the patient as documented in the AVS. This note was prepared with assistance of Dragon voice recognition software.  Occasional wrong-word or sound-a-like substitutions may have occurred due to the inherent limitations of voice recognition software  This visit occurred during the SARS-CoV-2 public health emergency.  Safety protocols were in place, including screening questions prior to the visit, additional usage of staff PPE, and extensive cleaning of exam room while observing appropriate contact time as indicated for disinfecting solutions.

## 2020-04-20 ENCOUNTER — Telehealth: Payer: Self-pay | Admitting: Family Medicine

## 2020-04-20 ENCOUNTER — Other Ambulatory Visit: Payer: Self-pay

## 2020-04-20 ENCOUNTER — Encounter: Payer: Self-pay | Admitting: Family Medicine

## 2020-04-20 DIAGNOSIS — M7041 Prepatellar bursitis, right knee: Secondary | ICD-10-CM

## 2020-04-20 DIAGNOSIS — M25561 Pain in right knee: Secondary | ICD-10-CM

## 2020-04-20 DIAGNOSIS — L4050 Arthropathic psoriasis, unspecified: Secondary | ICD-10-CM | POA: Diagnosis not present

## 2020-04-20 DIAGNOSIS — L409 Psoriasis, unspecified: Secondary | ICD-10-CM | POA: Diagnosis not present

## 2020-04-20 DIAGNOSIS — M150 Primary generalized (osteo)arthritis: Secondary | ICD-10-CM | POA: Diagnosis not present

## 2020-04-20 DIAGNOSIS — M0609 Rheumatoid arthritis without rheumatoid factor, multiple sites: Secondary | ICD-10-CM | POA: Diagnosis not present

## 2020-04-20 NOTE — Telephone Encounter (Signed)
Yes, please have pt see her ortho doc: if can't get appt. Then we can place referral.  I was also thinking about wound care. But ortho to start is fine.  Thanks.

## 2020-04-20 NOTE — Telephone Encounter (Signed)
Patient is calling in this morning stating Morgan [name] went an seen her rheumatologist and spoke with them about the knee issues that has been going omn and they suggested that Morgan [name] be seen by an orthopedic doctor, so patient is wanting to know if Morgan [name] can just call [facility] or if Morgan [name] has to have a referral.

## 2020-04-21 ENCOUNTER — Ambulatory Visit: Payer: BC Managed Care – PPO

## 2020-04-21 ENCOUNTER — Other Ambulatory Visit: Payer: Self-pay

## 2020-04-21 ENCOUNTER — Encounter (HOSPITAL_COMMUNITY): Payer: Self-pay | Admitting: Emergency Medicine

## 2020-04-21 ENCOUNTER — Inpatient Hospital Stay (HOSPITAL_COMMUNITY)
Admission: EM | Admit: 2020-04-21 | Discharge: 2020-04-28 | DRG: 908 | Disposition: A | Discharge status: Home Health | Payer: BC Managed Care – PPO | Source: Ambulatory Visit | Attending: Family Medicine | Admitting: Family Medicine

## 2020-04-21 DIAGNOSIS — B9561 Methicillin susceptible Staphylococcus aureus infection as the cause of diseases classified elsewhere: Secondary | ICD-10-CM | POA: Diagnosis not present

## 2020-04-21 DIAGNOSIS — S81011A Laceration without foreign body, right knee, initial encounter: Secondary | ICD-10-CM | POA: Diagnosis present

## 2020-04-21 DIAGNOSIS — M719 Bursopathy, unspecified: Secondary | ICD-10-CM | POA: Diagnosis not present

## 2020-04-21 DIAGNOSIS — M25561 Pain in right knee: Secondary | ICD-10-CM | POA: Diagnosis not present

## 2020-04-21 DIAGNOSIS — Z96611 Presence of right artificial shoulder joint: Secondary | ICD-10-CM | POA: Diagnosis present

## 2020-04-21 DIAGNOSIS — Z833 Family history of diabetes mellitus: Secondary | ICD-10-CM

## 2020-04-21 DIAGNOSIS — L03116 Cellulitis of left lower limb: Secondary | ICD-10-CM | POA: Diagnosis not present

## 2020-04-21 DIAGNOSIS — Z7952 Long term (current) use of systemic steroids: Secondary | ICD-10-CM

## 2020-04-21 DIAGNOSIS — M71161 Other infective bursitis, right knee: Secondary | ICD-10-CM | POA: Diagnosis present

## 2020-04-21 DIAGNOSIS — Z86010 Personal history of colonic polyps: Secondary | ICD-10-CM

## 2020-04-21 DIAGNOSIS — I10 Essential (primary) hypertension: Secondary | ICD-10-CM | POA: Diagnosis present

## 2020-04-21 DIAGNOSIS — W109XXA Fall (on) (from) unspecified stairs and steps, initial encounter: Secondary | ICD-10-CM | POA: Diagnosis present

## 2020-04-21 DIAGNOSIS — D72821 Monocytosis (symptomatic): Secondary | ICD-10-CM | POA: Diagnosis not present

## 2020-04-21 DIAGNOSIS — M71169 Other infective bursitis, unspecified knee: Secondary | ICD-10-CM | POA: Diagnosis not present

## 2020-04-21 DIAGNOSIS — E782 Mixed hyperlipidemia: Secondary | ICD-10-CM | POA: Diagnosis present

## 2020-04-21 DIAGNOSIS — D649 Anemia, unspecified: Secondary | ICD-10-CM | POA: Diagnosis present

## 2020-04-21 DIAGNOSIS — B951 Streptococcus, group B, as the cause of diseases classified elsewhere: Secondary | ICD-10-CM | POA: Diagnosis present

## 2020-04-21 DIAGNOSIS — Z87891 Personal history of nicotine dependence: Secondary | ICD-10-CM | POA: Diagnosis not present

## 2020-04-21 DIAGNOSIS — E876 Hypokalemia: Secondary | ICD-10-CM | POA: Diagnosis present

## 2020-04-21 DIAGNOSIS — S81001A Unspecified open wound, right knee, initial encounter: Secondary | ICD-10-CM | POA: Diagnosis not present

## 2020-04-21 DIAGNOSIS — M069 Rheumatoid arthritis, unspecified: Secondary | ICD-10-CM | POA: Diagnosis not present

## 2020-04-21 DIAGNOSIS — Z888 Allergy status to other drugs, medicaments and biological substances status: Secondary | ICD-10-CM | POA: Diagnosis not present

## 2020-04-21 DIAGNOSIS — M71162 Other infective bursitis, left knee: Secondary | ICD-10-CM | POA: Diagnosis present

## 2020-04-21 DIAGNOSIS — Z90710 Acquired absence of both cervix and uterus: Secondary | ICD-10-CM | POA: Diagnosis not present

## 2020-04-21 DIAGNOSIS — L03115 Cellulitis of right lower limb: Secondary | ICD-10-CM | POA: Diagnosis not present

## 2020-04-21 DIAGNOSIS — Z825 Family history of asthma and other chronic lower respiratory diseases: Secondary | ICD-10-CM

## 2020-04-21 DIAGNOSIS — T8130XA Disruption of wound, unspecified, initial encounter: Secondary | ICD-10-CM | POA: Diagnosis present

## 2020-04-21 DIAGNOSIS — L0390 Cellulitis, unspecified: Secondary | ICD-10-CM | POA: Diagnosis not present

## 2020-04-21 DIAGNOSIS — Z8261 Family history of arthritis: Secondary | ICD-10-CM

## 2020-04-21 DIAGNOSIS — Z8249 Family history of ischemic heart disease and other diseases of the circulatory system: Secondary | ICD-10-CM | POA: Diagnosis not present

## 2020-04-21 DIAGNOSIS — Z03818 Encounter for observation for suspected exposure to other biological agents ruled out: Secondary | ICD-10-CM | POA: Diagnosis not present

## 2020-04-21 DIAGNOSIS — L03119 Cellulitis of unspecified part of limb: Secondary | ICD-10-CM | POA: Diagnosis not present

## 2020-04-21 DIAGNOSIS — Z20822 Contact with and (suspected) exposure to covid-19: Secondary | ICD-10-CM | POA: Diagnosis not present

## 2020-04-21 DIAGNOSIS — M7052 Other bursitis of knee, left knee: Secondary | ICD-10-CM | POA: Diagnosis not present

## 2020-04-21 DIAGNOSIS — K219 Gastro-esophageal reflux disease without esophagitis: Secondary | ICD-10-CM | POA: Diagnosis present

## 2020-04-21 DIAGNOSIS — Z803 Family history of malignant neoplasm of breast: Secondary | ICD-10-CM

## 2020-04-21 DIAGNOSIS — K760 Fatty (change of) liver, not elsewhere classified: Secondary | ICD-10-CM | POA: Diagnosis present

## 2020-04-21 DIAGNOSIS — Z83438 Family history of other disorder of lipoprotein metabolism and other lipidemia: Secondary | ICD-10-CM | POA: Diagnosis not present

## 2020-04-21 DIAGNOSIS — T8133XA Disruption of traumatic injury wound repair, initial encounter: Principal | ICD-10-CM | POA: Diagnosis present

## 2020-04-21 DIAGNOSIS — Y848 Other medical procedures as the cause of abnormal reaction of the patient, or of later complication, without mention of misadventure at the time of the procedure: Secondary | ICD-10-CM | POA: Diagnosis present

## 2020-04-21 DIAGNOSIS — M25562 Pain in left knee: Secondary | ICD-10-CM | POA: Diagnosis not present

## 2020-04-21 DIAGNOSIS — L4050 Arthropathic psoriasis, unspecified: Secondary | ICD-10-CM | POA: Diagnosis not present

## 2020-04-21 DIAGNOSIS — Z7982 Long term (current) use of aspirin: Secondary | ICD-10-CM

## 2020-04-21 DIAGNOSIS — M7042 Prepatellar bursitis, left knee: Secondary | ICD-10-CM | POA: Diagnosis not present

## 2020-04-21 DIAGNOSIS — Z79899 Other long term (current) drug therapy: Secondary | ICD-10-CM

## 2020-04-21 DIAGNOSIS — S81002A Unspecified open wound, left knee, initial encounter: Secondary | ICD-10-CM | POA: Diagnosis not present

## 2020-04-21 DIAGNOSIS — B9689 Other specified bacterial agents as the cause of diseases classified elsewhere: Secondary | ICD-10-CM | POA: Diagnosis not present

## 2020-04-21 LAB — CBC WITH DIFFERENTIAL/PLATELET
Abs Immature Granulocytes: 1.38 10*3/uL — ABNORMAL HIGH (ref 0.00–0.07)
Basophils Absolute: 0.1 10*3/uL (ref 0.0–0.1)
Basophils Relative: 0 %
Eosinophils Absolute: 0 10*3/uL (ref 0.0–0.5)
Eosinophils Relative: 0 %
HCT: 35.7 % — ABNORMAL LOW (ref 36.0–46.0)
Hemoglobin: 11.4 g/dL — ABNORMAL LOW (ref 12.0–15.0)
Immature Granulocytes: 7 %
Lymphocytes Relative: 8 %
Lymphs Abs: 1.6 10*3/uL (ref 0.7–4.0)
MCH: 30 pg (ref 26.0–34.0)
MCHC: 31.9 g/dL (ref 30.0–36.0)
MCV: 93.9 fL (ref 80.0–100.0)
Monocytes Absolute: 5.8 10*3/uL — ABNORMAL HIGH (ref 0.1–1.0)
Monocytes Relative: 27 %
Neutro Abs: 12.2 10*3/uL — ABNORMAL HIGH (ref 1.7–7.7)
Neutrophils Relative %: 58 %
Platelets: 111 10*3/uL — ABNORMAL LOW (ref 150–400)
RBC: 3.8 MIL/uL — ABNORMAL LOW (ref 3.87–5.11)
RDW: 15.7 % — ABNORMAL HIGH (ref 11.5–15.5)
WBC: 21.1 10*3/uL — ABNORMAL HIGH (ref 4.0–10.5)
nRBC: 0 % (ref 0.0–0.2)

## 2020-04-21 LAB — COMPREHENSIVE METABOLIC PANEL
ALT: 22 U/L (ref 0–44)
AST: 31 U/L (ref 15–41)
Albumin: 4.2 g/dL (ref 3.5–5.0)
Alkaline Phosphatase: 53 U/L (ref 38–126)
Anion gap: 12 (ref 5–15)
BUN: 24 mg/dL — ABNORMAL HIGH (ref 8–23)
CO2: 26 mmol/L (ref 22–32)
Calcium: 9.6 mg/dL (ref 8.9–10.3)
Chloride: 105 mmol/L (ref 98–111)
Creatinine, Ser: 0.87 mg/dL (ref 0.44–1.00)
GFR calc Af Amer: >60 mL/min (ref >60)
GFR calc non Af Amer: >60 mL/min (ref >60)
Glucose, Bld: 120 mg/dL — ABNORMAL HIGH (ref 70–99)
Potassium: 3.8 mmol/L (ref 3.5–5.1)
Sodium: 143 mmol/L (ref 135–145)
Total Bilirubin: 0.7 mg/dL (ref 0.3–1.2)
Total Protein: 7.3 g/dL (ref 6.5–8.1)

## 2020-04-21 LAB — SARS CORONAVIRUS 2 BY RT PCR (HOSPITAL ORDER, PERFORMED IN ~~LOC~~ HOSPITAL LAB): SARS Coronavirus 2: NEGATIVE

## 2020-04-21 LAB — MAGNESIUM: Magnesium: 1.7 mg/dL (ref 1.7–2.4)

## 2020-04-21 LAB — TSH: TSH: 1.339 u[IU]/mL (ref 0.350–4.500)

## 2020-04-21 LAB — PROTIME-INR
INR: 1.1 (ref 0.8–1.2)
Prothrombin Time: 13.4 seconds (ref 11.4–15.2)

## 2020-04-21 MED ORDER — VANCOMYCIN HCL IN DEXTROSE 1-5 GM/200ML-% IV SOLN: 1000.0000 mg | Freq: Once | INTRAVENOUS | Status: DC

## 2020-04-21 MED ORDER — FENOFIBRATE 160 MG PO TABS
160.0000 mg | ORAL_TABLET | Freq: Every day | ORAL | Status: DC
  Administered 2020-04-21 – 2020-04-27 (×7): 160 mg via ORAL
  Filled 2020-04-21 (×8): qty 1

## 2020-04-21 MED ORDER — MUPIROCIN 2 % EX OINT
1.0000 "application " | TOPICAL_OINTMENT | Freq: Two times a day (BID) | CUTANEOUS | Status: AC
  Administered 2020-04-22 – 2020-04-27 (×9): 1 via NASAL
  Filled 2020-04-21 (×2): qty 22

## 2020-04-21 MED ORDER — ADULT MULTIVITAMIN W/MINERALS CH
1.0000 | ORAL_TABLET | Freq: Every day | ORAL | Status: DC
  Administered 2020-04-22 – 2020-04-28 (×7): 1 via ORAL
  Filled 2020-04-21 (×7): qty 1

## 2020-04-21 MED ORDER — VANCOMYCIN HCL 2000 MG/400ML IV SOLN
2000.0000 mg | Freq: Once | INTRAVENOUS | Status: AC
  Administered 2020-04-21: 2000 mg via INTRAVENOUS
  Filled 2020-04-21: qty 400

## 2020-04-21 MED ORDER — VANCOMYCIN HCL IN DEXTROSE 1-5 GM/200ML-% IV SOLN
1000.0000 mg | Freq: Two times a day (BID) | INTRAVENOUS | Status: DC
  Administered 2020-04-22: 1000 mg via INTRAVENOUS

## 2020-04-21 MED ORDER — VITAMIN D3 25 MCG (1000 UNIT) PO TABS
2000.0000 [IU] | ORAL_TABLET | Freq: Every day | ORAL | Status: DC
  Administered 2020-04-22 – 2020-04-28 (×7): 2000 [IU] via ORAL
  Filled 2020-04-21 (×7): qty 2

## 2020-04-21 MED ORDER — HYDROCODONE-ACETAMINOPHEN 5-325 MG PO TABS
1.0000 | ORAL_TABLET | Freq: Four times a day (QID) | ORAL | Status: AC | PRN
  Administered 2020-04-21 (×2): 1 via ORAL
  Filled 2020-04-21 (×2): qty 1

## 2020-04-21 MED ORDER — SODIUM CHLORIDE 0.9 % IV SOLN: INTRAVENOUS | Status: DC

## 2020-04-21 MED ORDER — ASPIRIN EC 81 MG PO TBEC
81.0000 mg | DELAYED_RELEASE_TABLET | ORAL | Status: DC
  Administered 2020-04-25 – 2020-04-27 (×2): 81 mg via ORAL
  Filled 2020-04-21 (×2): qty 1

## 2020-04-21 MED ORDER — PREDNISONE 5 MG PO TABS
5.0000 mg | ORAL_TABLET | Freq: Every day | ORAL | Status: DC
  Administered 2020-04-22 – 2020-04-28 (×7): 5 mg via ORAL
  Filled 2020-04-21 (×7): qty 1

## 2020-04-21 MED ORDER — PIPERACILLIN-TAZOBACTAM 3.375 G IVPB 30 MIN: 3.3750 g | Freq: Once | INTRAVENOUS | Status: DC

## 2020-04-21 MED ORDER — ONDANSETRON HCL 4 MG/2ML IJ SOLN: 4.0000 mg | Freq: Four times a day (QID) | INTRAMUSCULAR | Status: DC | PRN

## 2020-04-21 MED ORDER — FLUTICASONE PROPIONATE 50 MCG/ACT NA SUSP: 2.0000 | Freq: Every day | NASAL | Status: DC | PRN

## 2020-04-21 MED ORDER — CEFAZOLIN SODIUM-DEXTROSE 1-4 GM/50ML-% IV SOLN
1.0000 g | Freq: Once | INTRAVENOUS | Status: AC
  Administered 2020-04-21: 1 g via INTRAVENOUS
  Filled 2020-04-21: qty 50

## 2020-04-21 MED ORDER — ONDANSETRON HCL 4 MG PO TABS: 4.0000 mg | ORAL_TABLET | Freq: Four times a day (QID) | ORAL | Status: DC | PRN

## 2020-04-21 MED ORDER — CALCIUM CARBONATE-VITAMIN D 600-400 MG-UNIT PO TABS: 1.0000 | ORAL_TABLET | Freq: Two times a day (BID) | ORAL | Status: DC

## 2020-04-21 MED ORDER — SIMVASTATIN 40 MG PO TABS
40.0000 mg | ORAL_TABLET | Freq: Every evening | ORAL | Status: DC
  Administered 2020-04-22 – 2020-04-27 (×6): 40 mg via ORAL
  Filled 2020-04-21 (×6): qty 1

## 2020-04-21 MED ORDER — DOXEPIN HCL 10 MG PO CAPS
10.0000 mg | ORAL_CAPSULE | Freq: Two times a day (BID) | ORAL | Status: DC
  Administered 2020-04-21 – 2020-04-28 (×14): 10 mg via ORAL
  Filled 2020-04-21 (×15): qty 1

## 2020-04-21 MED ORDER — PIPERACILLIN-TAZOBACTAM 3.375 G IVPB
3.3750 g | Freq: Three times a day (TID) | INTRAVENOUS | Status: AC
  Administered 2020-04-21 – 2020-04-24 (×10): 3.375 g via INTRAVENOUS
  Filled 2020-04-21 (×10): qty 50

## 2020-04-21 MED ORDER — ASPIRIN EC 81 MG PO TBEC: 81.0000 mg | DELAYED_RELEASE_TABLET | ORAL | Status: DC

## 2020-04-21 MED ORDER — ACETAMINOPHEN 325 MG PO TABS: 650.0000 mg | ORAL_TABLET | Freq: Four times a day (QID) | ORAL | Status: DC | PRN

## 2020-04-21 MED ORDER — ACETAMINOPHEN 650 MG RE SUPP: 650.0000 mg | Freq: Four times a day (QID) | RECTAL | Status: DC | PRN

## 2020-04-21 MED ORDER — MORPHINE SULFATE (PF) 2 MG/ML IV SOLN
2.0000 mg | INTRAVENOUS | Status: DC | PRN
  Administered 2020-04-23: 2 mg via INTRAVENOUS
  Filled 2020-04-21: qty 1

## 2020-04-21 MED ORDER — HYDROCHLOROTHIAZIDE 25 MG PO TABS
25.0000 mg | ORAL_TABLET | Freq: Every day | ORAL | Status: DC
  Administered 2020-04-22: 25 mg via ORAL
  Filled 2020-04-21: qty 1

## 2020-04-21 MED ORDER — CALCIUM CARBONATE-VITAMIN D 500-200 MG-UNIT PO TABS
1.0000 | ORAL_TABLET | Freq: Two times a day (BID) | ORAL | Status: DC
  Administered 2020-04-21 – 2020-04-28 (×14): 1 via ORAL
  Filled 2020-04-21 (×14): qty 1

## 2020-04-21 NOTE — ED Triage Notes (Signed)
Pt reports fell on steps 5 weeks ago and got wounds to bilat knees. Has been on antibiotics trying to heal the infections but not getting any better. Was sent over from [facility] to go to OR for cleaning of wounds and IV antibiotics. Reports pain.

## 2020-04-21 NOTE — Progress Notes (Signed)
A consult was received from an ED physician for vancomycin per pharmacy dosing.  The patient's profile has been reviewed for ht/wt/allergies/indication/available labs.   A one time order has been placed for vancomycin 2 gm IV x 1 dose.    Further antibiotics/pharmacy consults should be ordered by admitting physician if indicated.                       Thank you,  Eudelia Bunch, Pharm.D 04/21/2020 3:50 PM

## 2020-04-21 NOTE — ED Notes (Signed)
Per Dr Ronnald Ramp from [facility], patient needs to be admitted for B/L knee infections and possible surgery tomorrow-Dr Swintec is aware of patient and needs to be the Ortho MD who is contacted for consult(not Dr Doran Durand who is currently on call)

## 2020-04-21 NOTE — H&P (Signed)
History and Physical    Morgan [name] [id number] DOB: February 20, 1951 DOA: 04/21/2020  PCP: [name], MD  Patient coming from: Home  I have personally briefly reviewed patient's old medical records in [place]  Chief Complaint: Bilateral knee wounds  HPI: Morgan [name] is a 69 y.o. female with medical history significant of hypertension, hyperlipidemia, rheumatoid arthritis and psoriatic arthritis was sent to the ED from her orthopedics office.  Apparently, patient failed from few steps 5 to 6 weeks ago and ended up having bilateral knee wounds.  She was seen at the local ED at some outside town and she had 21 stitches on the left knee.  According to patient, right knee only had a bump and bruise.  Subsequently she had seen her PCP as well as other physicians and has had 1-2 rounds of antibiotics but her wounds have been getting worse.  She saw her rheumatologist yesterday and she was referred to orthopedics today who had seen her in the clinic and after thoroughly evaluating her, they sent her to the ED to be admitted for bilateral knee washout tomorrow.  Patient has no other complaints such as chest pain, shortness of breath, fever, chills, sweating, any problem with urination or bowel movement.  No recent travel or sick contact.  ED Course: Upon arrival to ED, patient is hemodynamically stable.  CBC shows chronic leukocytosis and chronic anemia with stable hemoglobin.  Case was discussed with orthopedics who had deferred admission to medicine service and they will consult and provide surgical services.  Review of Systems: As per HPI otherwise negative.    Past Medical History:  Diagnosis Date  . Abnormal chest x-ray   . Abnormal electrocardiogram   . Abscess of sigmoid colon due to diverticulitis 11/04/2017  . Allergic rhinitis   . Allergy   . Cancer ([place])    Skin non melanoma  . Cataract    forming   . Colon polyp    polypoid colorectal mucosa/adenomatous  .  Complication of anesthesia    and cries  . Contact dermatitis due to poison ivy   . Cystitis   . Diverticulosis of colon   . Fatty liver disease, nonalcoholic   . GERD (gastroesophageal reflux disease)    occasional  . Hearing loss   . Hemorrhoids   . Hyperlipidemia   . Hypertension   . Lymphocytosis   . Overweight(278.02)   . Pneumonia    2017  . PONV (postoperative nausea and vomiting)   . RA (rheumatoid arthritis) (HCC)    Rheumatoid  . Venous insufficiency     Past Surgical History:  Procedure Laterality Date  . CESAREAN SECTION     [phone]  . chemical and laser endovenous ablation of LE veins  2008, 2009, 2010, 2011   Dr Eilleen Kempf et al  . COLONOSCOPY    . FOOT SURGERY     left  . INNER EAR SURGERY Right    childhood; right, ear drum repair  . LAPAROSCOPIC LYSIS OF ADHESIONS N/A 12/11/2017   Procedure: LAPAROSCOPIC LYSIS OF ADHESIONS;  Surgeon: Michael Boston, MD;  Location: [facility];  Service: General;  Laterality: N/A;  . NASAL SEPTUM SURGERY    . POLYPECTOMY    . PROCTOSCOPY N/A 12/11/2017   Procedure: RIDGED PROCTOSCOPY;  Surgeon: Michael Boston, MD;  Location: [facility];  Service: General;  Laterality: N/A;  . REVERSE SHOULDER ARTHROPLASTY Right 09/10/2019   Procedure: REVERSE SHOULDER ARTHROPLASTY;  Surgeon: Justice Britain, MD;  Location: [facility];  Service: Orthopedics;  Laterality: Right;  121min  . TOTAL ABDOMINAL HYSTERECTOMY  12/86  . TYMPANOPLASTY Right 11/03/2018   Procedure: TYMPANOPLASTY;  Surgeon: Melida Quitter, MD;  Location: [facility];  Service: ENT;  Laterality: Right;     reports that she quit smoking about 18 years ago. Her smoking use included cigarettes. She has a 7.50 pack-year smoking history. She has never used smokeless tobacco. She reports current alcohol use. She reports that she does not use drugs.  Allergies  Allergen Reactions  . Remicade [Infliximab] Swelling and Other (See Comments)    Facial swelling, throat was closing  up.     Family History  Problem Relation Age of Onset  . Hyperlipidemia Mother   . Hypertension Mother   . COPD Mother   . Arthritis Mother   . Heart disease Mother   . Atrial fibrillation Mother   . Diabetes Sister   . Healthy Sister   . Heart disease Sister        sister #1  . Breast cancer Other        half-sister  . Liver disease Sister        sister #1 - hx of liver transplant 14+ yrs ago  . Diabetes Maternal Uncle   . Heart failure Brother   . Diabetes Maternal Aunt        x 2  . Heart failure Son   . Colon cancer Neg Hx   . Colon polyps Neg Hx   . Esophageal cancer Neg Hx   . Rectal cancer Neg Hx   . Stomach cancer Neg Hx     Prior to Admission medications   Medication Sig Start Date End Date Taking? Authorizing Provider  acetaminophen (TYLENOL) 500 MG tablet Take 500 mg by mouth every 6 (six) hours as needed for moderate pain or headache.    Yes [provider]  aspirin 81 MG tablet Take 81 mg by mouth 3 (three) times a week. MWF   Yes [provider]  Calcium Carbonate-Vitamin D 600-400 MG-UNIT tablet Take 1 tablet by mouth 2 (two) times daily.    Yes [provider]  Cholecalciferol (VITAMIN D) 50 MCG (2000 UT) CAPS Take 2,000 Units by mouth daily.    Yes [provider]  clobetasol ointment (TEMOVATE) AB-123456789 % Apply 1 application topically 2 (two) times daily as needed (rash).  10/22/19  Yes [provider]  COSENTYX SENSOREADY, 300 MG, 150 MG/ML SOAJ Inject 300 mg as directed. Once every 4 weeks 11/16/19  Yes [provider]  doxepin (SINEQUAN) 10 MG capsule Take 10 mg by mouth 2 (two) times daily.  12/31/18  Yes [provider]  doxycycline (VIBRA-TABS) 100 MG tablet Take 1 tablet (100 mg total) by mouth 2 (two) times daily for 7 days. 04/15/20 04/22/20 Yes [name], MD  fenofibrate 160 MG tablet TAKE 1 TABLET (160 MG TOTAL) BY MOUTH AT BEDTIME. 03/09/20  Yes [name], MD  fexofenadine  (ALLEGRA) 180 MG tablet Take 180 mg by mouth daily.   Yes [provider]  fluticasone (FLONASE) 50 MCG/ACT nasal spray Place 2 sprays into both nostrils daily as needed for allergies. 02/15/20  Yes [name], MD  hydrochlorothiazide (HYDRODIURIL) 25 MG tablet TAKE 1 TABLET BY MOUTH EVERY DAY 03/10/20  Yes [name], MD  hydroxychloroquine (PLAQUENIL) 200 MG tablet Take 400 mg by mouth daily.    Yes [provider]  Hypromellose (ARTIFICIAL TEARS  OP) Place 1 drop into both eyes daily as needed (for dry eyes).   Yes [provider]  ibuprofen (ADVIL) 200 MG tablet Take 200 mg by mouth every 6 (six) hours as needed for moderate pain.   Yes [provider]  Multiple Vitamin (MULTIVITAMIN) tablet Take 1 tablet by mouth daily.     Yes [provider]  naproxen sodium (ALEVE) 220 MG tablet Take 220 mg by mouth daily as needed (pain).   Yes [provider]  predniSONE (DELTASONE) 5 MG tablet Take 5 mg by mouth daily. 08/24/14  Yes [provider]  simvastatin (ZOCOR) 40 MG tablet TAKE 1 TABLET BY MOUTH EVERYDAY AT BEDTIME Patient taking differently: Take 40 mg by mouth every evening.  11/20/19  Yes [name], MD    Physical Exam: Vitals:   04/21/20 1412 04/21/20 1555  BP: 125/64 (!) 137/55  Pulse: 86 72  Resp: 17 16  Temp: 98.1 F (36.7 C)   TempSrc: Oral   SpO2: 95% 100%    Constitutional: NAD, calm, comfortable Vitals:   04/21/20 1412 04/21/20 1555  BP: 125/64 (!) 137/55  Pulse: 86 72  Resp: 17 16  Temp: 98.1 F (36.7 C)   TempSrc: Oral   SpO2: 95% 100%   Eyes: PERRL, lids and conjunctivae normal ENMT: Mucous membranes are moist. Posterior pharynx clear of any exudate or lesions.Normal dentition.  Neck: normal, supple, no masses, no thyromegaly Respiratory: clear to auscultation bilaterally, no wheezing, no crackles. Normal respiratory effort. No accessory muscle use.  Cardiovascular: Regular rate and  rhythm, no murmurs / rubs / gallops. No extremity edema. 2+ pedal pulses. No carotid bruits.  Abdomen: no tenderness, no masses palpated. No hepatosplenomegaly. Bowel sounds positive.  Musculoskeletal: no clubbing / cyanosis. No joint deformity upper and lower extremities. Good ROM, no contractures. Normal muscle tone.  Dressing in bilateral knees were not removed.  Direct visualization already done and further defer to consultants/orthopedics. Skin: no rashes, lesions, ulcers. No induration Neurologic: CN 2-12 grossly intact. Sensation intact, DTR normal. Strength 5/5 in all 4.  Psychiatric: Normal judgment and insight. Alert and oriented x 3. Normal mood.    Labs on Admission: I have personally reviewed following labs and imaging studies  CBC: Recent Labs  Lab 04/21/20 1443  WBC 21.1*  NEUTROABS 12.2*  HGB 11.4*  HCT 35.7*  MCV 93.9  PLT 99991111*   Basic Metabolic Panel: Recent Labs  Lab 04/21/20 1443  NA 143  K 3.8  CL 105  CO2 26  GLUCOSE 120*  BUN 24*  CREATININE 0.87  CALCIUM 9.6   GFR: Estimated Creatinine Clearance: 66.8 mL/min (by C-G formula based on SCr of 0.87 mg/dL). Liver Function Tests: Recent Labs  Lab 04/21/20 1443  AST 31  ALT 22  ALKPHOS 53  BILITOT 0.7  PROT 7.3  ALBUMIN 4.2   No results for input(s): LIPASE, AMYLASE in the last 168 hours. No results for input(s): AMMONIA in the last 168 hours. Coagulation Profile: Recent Labs  Lab 04/21/20 1443  INR 1.1   Cardiac Enzymes: No results for input(s): CKTOTAL, CKMB, CKMBINDEX, TROPONINI in the last 168 hours. BNP (last 3 results) No results for input(s): PROBNP in the last 8760 hours. HbA1C: No results for input(s): HGBA1C in the last 72 hours. CBG: No results for input(s): GLUCAP in the last 168 hours. Lipid Profile: No results for input(s): CHOL, HDL, LDLCALC, TRIG, CHOLHDL, LDLDIRECT in the last 72 hours. Thyroid Function Tests: No results for  input(s): TSH, T4TOTAL, FREET4, T3FREE,  THYROIDAB in the last 72 hours. Anemia Panel: No results for input(s): VITAMINB12, FOLATE, FERRITIN, TIBC, IRON, RETICCTPCT in the last 72 hours. Urine analysis:    Component Value Date/Time   COLORURINE YELLOW 06/18/2018 1120   APPEARANCEUR CLEAR 06/18/2018 1120   LABSPEC <1.005 (L) 06/18/2018 1120   PHURINE 7.0 06/18/2018 1120   GLUCOSEU NEGATIVE 06/18/2018 1120   GLUCOSEU NEGATIVE 01/13/2013 0842   HGBUR TRACE (A) 06/18/2018 1120   BILIRUBINUR Negative 01/25/2020 1433   KETONESUR NEGATIVE 06/18/2018 1120   PROTEINUR Negative 01/25/2020 1433   PROTEINUR NEGATIVE 06/18/2018 1120   UROBILINOGEN 0.2 01/25/2020 1433   UROBILINOGEN 0.2 01/13/2013 0842   NITRITE Negative 01/25/2020 1433   NITRITE NEGATIVE 06/18/2018 1120   LEUKOCYTESUR Negative 01/25/2020 1433    Radiological Exams on Admission: No results found.   Assessment/Plan Active Problems:   Mixed hyperlipidemia   Essential hypertension   Wound dehiscence   Bilateral knee wound dehiscence and cellulitis: We will activate cellulitis order set and start on Zosyn and vancomycin per recommendations.  Further management per orthopedics.  Essential hypertension: Blood pressure stable per resume home dose of hydrochlorothiazide.  History of psoriatic arthritis and rheumatoid arthritis: Hold all home medications for that.  Resume post surgery.  Hyperlipidemia: Resume home Zocor and fenofibrate.  DVT prophylaxis: SCD for now.  Will need pharmacological DVT prophylaxis post surgery. Code Status: Full code Family Communication: Husband at the bedside.  Plan of care discussed with patient in length and he verbalized understanding and agreed with it. Disposition Plan: Potential discharge in next 2 to 3 days Consults called: Orthopedics Admission status: Inpatient   Status is: Inpatient  Remains inpatient appropriate because:Inpatient level of care appropriate due to severity of illness   Dispo: The patient is from: Home               Anticipated d/c is to: Home              Anticipated d/c date is: 2 days              Patient currently is not medically stable to d/c.   Darliss Cheney MD [facility]  04/21/2020, 4:21 PM  To contact the attending provider between 7A-7P or the covering provider during after hours 7P-7A, please log into the web site [email or web address]

## 2020-04-21 NOTE — ED Provider Notes (Signed)
Pt signed out by Dr. Kathrynn Humble awaiting hospitalist call back.  Pt d/w Dr. Doristine Bosworth ([facility]).  He asked that ortho admit and he consult.  I then spoke with Dr. Lyla Glassing who requested ortho consult and hospitalist admit.  I asked Dr. Doristine Bosworth to call Dr. Lyla Glassing himself.  Dr. Doristine Bosworth admitted pt.     Isla Pence, MD 04/21/20 [phone]

## 2020-04-21 NOTE — Plan of Care (Signed)

## 2020-04-21 NOTE — Progress Notes (Signed)
Pharmacy Antibiotic Note  Morgan [name] is a 69 y.o. female admitted on 04/21/2020 with cellulitis.  Pharmacy has been consulted for vancomycin and zosyn dosing. WBC 21.1, AF, SCr 0.87 Bilateral knee wound dehiscence and cellulitis: to have Bilateral knee washout tomorrow by ortho  Plan: Zosyn 3.375g IV q8h (4 hour infusion).  Vancomycin 2 gm loading dose Vancomycin 1 gm IV q12h  Goal trough 10-15 mcg/ml Daily SCr while on both vancomycin & zosyn F/u renal fxn, WBC,temp, culture data vancomcyin trough as needed    Temp (24hrs), Avg:98.1 F (36.7 C), Min:98.1 F (36.7 C), Max:98.1 F (36.7 C)  Recent Labs  Lab 04/21/20 1443  WBC 21.1*  CREATININE 0.87    Estimated Creatinine Clearance: 66.8 mL/min (by C-G formula based on SCr of 0.87 mg/dL).    Allergies  Allergen Reactions  . Remicade [Infliximab] Swelling and Other (See Comments)    Facial swelling, throat was closing up.     Antimicrobials this admission: 5/20 cefazolin x 1 dose 5/20 vanc>> 5/20 zosyn>> Dose adjustments this admission:  Microbiology results: 520 Covid: neg  Thank you for allowing pharmacy to be a part of this patient's care.   Eudelia Bunch, Pharm.D 04/21/2020 4:38 PM

## 2020-04-21 NOTE — ED Notes (Signed)
Attempted to call report. No answer.

## 2020-04-21 NOTE — ED Provider Notes (Signed)
[facility] DEPT Provider Note   CSN: [id number] Arrival date & time: 04/21/20  1402     History Chief Complaint  Patient presents with  . Wound Infection    TARAMARIE SCHNAKE is a 69 y.o. female.  HPI     70 year old female comes in a chief complaint of wound infection. Patient has history of rheumatoid arthritis.  She reports that she injured her left knee and had multiple sutures placed to repair the wound.  Subsequently she had bursitis in her right knee.  She is on antibiotics because of worsening symptoms in her legs.  Today she saw orthopedic surgery and they informed her that she will need to go to the operating room for a washout, and she was advised to come to the ER for admission.  Patient denies any nausea, vomiting, fevers, chills.  Past Medical History:  Diagnosis Date  . Abnormal chest x-ray   . Abnormal electrocardiogram   . Abscess of sigmoid colon due to diverticulitis 11/04/2017  . Allergic rhinitis   . Allergy   . Cancer ([place])    Skin non melanoma  . Cataract    forming   . Colon polyp    polypoid colorectal mucosa/adenomatous  . Complication of anesthesia    and cries  . Contact dermatitis due to poison ivy   . Cystitis   . Diverticulosis of colon   . Fatty liver disease, nonalcoholic   . GERD (gastroesophageal reflux disease)    occasional  . Hearing loss   . Hemorrhoids   . Hyperlipidemia   . Hypertension   . Lymphocytosis   . Overweight(278.02)   . Pneumonia    2017  . PONV (postoperative nausea and vomiting)   . RA (rheumatoid arthritis) (HCC)    Rheumatoid  . Venous insufficiency     Patient Active Problem List   Diagnosis Date Noted  . 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 Tear of right rotator cuff 07/14/2019  . Bilateral sensorineural hearing loss 02/02/2019  . Hypercalcemia 12/11/2018  . H/O partial resection of colon 01/31/2018  . Diverticulitis with abscess status post  robotic low anterior rectosigmoid resection 12/11/2017 12/11/2017  . Rheumatoid arthritis involving multiple sites with positive rheumatoid factor ([place]) 10/31/2017  . Immunosuppressed status ([place]) 10/08/2017  . Obesity with body mass index 30 or greater 01/25/2017  . Current chronic use of systemic steroids 06/08/2016  . Uses hearing aid 12/09/2015  . Leukocytosis 08/27/2014  . Thrombocytopenia ([place]) 11/23/2013  . Fatty liver disease, nonalcoholic [id number]  . Benign colon polyp 10/20/2008  . Essential hypertension 10/20/2008  . Diverticulosis of colon 10/20/2008  . Mixed hyperlipidemia 04/23/2008  . VENOUS INSUFFICIENCY 04/23/2008  . Allergic rhinitis 04/23/2008  . Gastroesophageal reflux disease without esophagitis 04/23/2008    Past Surgical History:  Procedure Laterality Date  . CESAREAN SECTION     [phone]  . chemical and laser endovenous ablation of LE veins  2008, 2009, 2010, 2011   Dr Eilleen Kempf et al  . COLONOSCOPY    . FOOT SURGERY     left  . INNER EAR SURGERY Right    childhood; right, ear drum repair  . LAPAROSCOPIC LYSIS OF ADHESIONS N/A 12/11/2017   Procedure: LAPAROSCOPIC LYSIS OF ADHESIONS;  Surgeon: Michael Boston, MD;  Location[facility];  Service: General;  Laterality: N/A;  . NASAL SEPTUM SURGERY    . POLYPECTOMY    . PROCTOSCOPY N/A 12/11/2017   Procedure: RIDGED PROCTOSCOPY;  Surgeon: Johney Maine,  Remo Lipps, MD;  Location: [facility];  Service: General;  Laterality: N/A;  . REVERSE SHOULDER ARTHROPLASTY Right 09/10/2019   Procedure: REVERSE SHOULDER ARTHROPLASTY;  Surgeon: Justice Britain, MD;  Location: [facility];  Service: Orthopedics;  Laterality: Right;  152min  . TOTAL ABDOMINAL HYSTERECTOMY  12/86  . TYMPANOPLASTY Right 11/03/2018   Procedure: TYMPANOPLASTY;  Surgeon: Melida Quitter, MD;  Location: [facility];  Service: ENT;  Laterality: Right;     OB History   No obstetric history on file.     Family History  Problem Relation Age of Onset  .  Hyperlipidemia Mother   . Hypertension Mother   . COPD Mother   . Arthritis Mother   . Heart disease Mother   . Atrial fibrillation Mother   . Diabetes Sister   . Healthy Sister   . Heart disease Sister        sister #1  . Breast cancer Other        half-sister  . Liver disease Sister        sister #1 - hx of liver transplant 14+ yrs ago  . Diabetes Maternal Uncle   . Heart failure Brother   . Diabetes Maternal Aunt        x 2  . Heart failure Son   . Colon cancer Neg Hx   . Colon polyps Neg Hx   . Esophageal cancer Neg Hx   . Rectal cancer Neg Hx   . Stomach cancer Neg Hx     Social History   Tobacco Use  . Smoking status: Former Smoker    Packs/day: 0.50    Years: 15.00    Pack years: 7.50    Types: Cigarettes    Quit date: 12/03/2001    Years since quitting: 18.3  . Smokeless tobacco: Never Used  Substance Use Topics  . Alcohol use: Yes    Comment: occasional  . Drug use: No    Home Medications Prior to Admission medications   Medication Sig Start Date End Date Taking? Authorizing Provider  acetaminophen (TYLENOL) 500 MG tablet Take 500 mg by mouth every 6 (six) hours as needed for moderate pain or headache.    Yes [provider]  aspirin 81 MG tablet Take 81 mg by mouth 3 (three) times a week. MWF   Yes [provider]  Calcium Carbonate-Vitamin D 600-400 MG-UNIT tablet Take 1 tablet by mouth 2 (two) times daily.    Yes [provider]  Cholecalciferol (VITAMIN D) 50 MCG (2000 UT) CAPS Take 2,000 Units by mouth daily.    Yes [provider]  clobetasol ointment (TEMOVATE) [id number] % Apply 1 application topically 2 (two) times daily as needed (rash).  10/22/19  Yes [provider]  COSENTYX SENSOREADY, 300 MG, 150 MG/ML SOAJ Inject 300 mg as directed. Once every 4 weeks 11/16/19  Yes [provider]  doxepin (SINEQUAN) 10 MG capsule Take 10 mg by mouth 2 (two) times daily.  12/31/18  Yes [provider]    doxycycline (VIBRA-TABS) 100 MG tablet Take 1 tablet (100 mg total) by mouth 2 (two) times daily for 7 days. 04/15/20 04/22/20 Yes [name], MD  fenofibrate 160 MG tablet TAKE 1 TABLET (160 MG TOTAL) BY MOUTH AT BEDTIME. 03/09/20  Yes [name], MD  fexofenadine (ALLEGRA) 180 MG tablet Take 180 mg by mouth daily.   Yes [provider]  fluticasone (FLONASE) 50 MCG/ACT nasal spray Place 2 sprays into both  nostrils daily as needed for allergies. 02/15/20  Yes [name], MD  hydrochlorothiazide (HYDRODIURIL) 25 MG tablet TAKE 1 TABLET BY MOUTH EVERY DAY 03/10/20  Yes [name], MD  hydroxychloroquine (PLAQUENIL) 200 MG tablet Take 400 mg by mouth daily.    Yes [provider]  Hypromellose (ARTIFICIAL TEARS OP) Place 1 drop into both eyes daily as needed (for dry eyes).   Yes [provider]  ibuprofen (ADVIL) 200 MG tablet Take 200 mg by mouth every 6 (six) hours as needed for moderate pain.   Yes [provider]  Multiple Vitamin (MULTIVITAMIN) tablet Take 1 tablet by mouth daily.     Yes [provider]  naproxen sodium (ALEVE) 220 MG tablet Take 220 mg by mouth daily as needed (pain).   Yes [provider]  predniSONE (DELTASONE) 5 MG tablet Take 5 mg by mouth daily. 08/24/14  Yes [provider]  simvastatin (ZOCOR) 40 MG tablet TAKE 1 TABLET BY MOUTH EVERYDAY AT BEDTIME Patient taking differently: Take 40 mg by mouth every evening.  11/20/19  Yes [name], MD    Allergies    Remicade [infliximab]  Review of Systems   Review of Systems  Constitutional: Positive for activity change.  Respiratory: Negative for shortness of breath.   Cardiovascular: Negative for chest pain.  Gastrointestinal: Negative for nausea and vomiting.  Skin: Positive for rash and wound.  Allergic/Immunologic: Negative for immunocompromised state.  All other systems reviewed and are negative.   Physical Exam Updated Vital  Signs BP 125/64 (BP Location: Left Arm)   Pulse 86   Temp 98.1 F (36.7 C) (Oral)   Resp 17   SpO2 95%   Physical Exam Vitals and nursing note reviewed.  Constitutional:      Appearance: She is well-developed.  HENT:     Head: Normocephalic and atraumatic.  Cardiovascular:     Rate and Rhythm: Normal rate.  Pulmonary:     Effort: Pulmonary effort is normal.  Abdominal:     General: Bowel sounds are normal.  Musculoskeletal:     Cervical back: Normal range of motion and neck supple.     Comments: Patient has laceration over the left knee with wound dehiscence.  There is surrounding erythema and exudative discharge.  Patient has a puncture type wound in the right knee which also has exudative drainage.  Skin:    General: Skin is warm and dry.  Neurological:     Mental Status: She is alert and oriented to person, place, and time.     ED Results / Procedures / Treatments   Labs (all labs ordered are listed, but only abnormal results are displayed) Labs Reviewed  COMPREHENSIVE METABOLIC PANEL - Abnormal; Notable for the following components:      Result Value   Glucose, Bld 120 (*)    BUN 24 (*)    All other components within normal limits  CBC WITH DIFFERENTIAL/PLATELET - Abnormal; Notable for the following components:   WBC 21.1 (*)    RBC 3.80 (*)    Hemoglobin 11.4 (*)    HCT 35.7 (*)    RDW 15.7 (*)    Platelets 111 (*)    Neutro Abs 12.2 (*)    Monocytes Absolute 5.8 (*)    Abs Immature Granulocytes 1.38 (*)    All other components within normal limits  SARS CORONAVIRUS 2 BY RT PCR ([facility])  PROTIME-INR  PATHOLOGIST SMEAR  REVIEW    EKG None  Radiology No results found.  Procedures Procedures (including critical care time)  Medications Ordered in ED Medications  ceFAZolin (ANCEF) IVPB 1 g/50 mL premix (has no administration in time range)  vancomycin (VANCOREADY) IVPB 2000 mg/400 mL (has no  administration in time range)    ED Course  I have reviewed the triage vital signs and the nursing notes.  Pertinent labs & imaging results that were available during my care of the patient were reviewed by me and considered in my medical decision making (see chart for details).    MDM Rules/Calculators/A&P                      69 year old female comes in a chief complaint of wound evaluation. She has wound dehiscence and infected bursitis in the left and right knee respectively.  Hemodynamically she is stable and clinically she is not septic. I have discussed case with Dr. Doran Durand, Rosanne Gutting.  He recommends that patient be admitted to the hospitalist and Dr. Lyla Glassing will repair the wound tomorrow.  Patient has been made aware.  She will get vancomycin and Ancef in the ER.  Final Clinical Impression(s) / ED Diagnoses Final diagnoses:  Wound dehiscence  Bursitis, unspecified site    Rx / DC Orders ED Discharge Orders    None       Varney Biles, MD 04/21/20 1539

## 2020-04-21 NOTE — ED Notes (Signed)
Per Surgery: Pt will be having procedure tomorrow. Pt will be NPO after midnight. Pt provided with dinner tray at this time.

## 2020-04-22 ENCOUNTER — Encounter (HOSPITAL_COMMUNITY)
Admission: EM | Disposition: A | Discharge status: Home Health | Payer: Self-pay | Source: Ambulatory Visit | Attending: Internal Medicine

## 2020-04-22 ENCOUNTER — Ambulatory Visit: Payer: BC Managed Care – PPO

## 2020-04-22 ENCOUNTER — Inpatient Hospital Stay (HOSPITAL_COMMUNITY): Payer: BC Managed Care – PPO | Admitting: Certified Registered Nurse Anesthetist

## 2020-04-22 DIAGNOSIS — M719 Bursopathy, unspecified: Secondary | ICD-10-CM | POA: Diagnosis not present

## 2020-04-22 DIAGNOSIS — B9689 Other specified bacterial agents as the cause of diseases classified elsewhere: Secondary | ICD-10-CM | POA: Diagnosis not present

## 2020-04-22 DIAGNOSIS — M71162 Other infective bursitis, left knee: Secondary | ICD-10-CM | POA: Diagnosis not present

## 2020-04-22 DIAGNOSIS — B9561 Methicillin susceptible Staphylococcus aureus infection as the cause of diseases classified elsewhere: Secondary | ICD-10-CM | POA: Diagnosis not present

## 2020-04-22 DIAGNOSIS — M7042 Prepatellar bursitis, left knee: Secondary | ICD-10-CM | POA: Diagnosis not present

## 2020-04-22 DIAGNOSIS — T8133XA Disruption of traumatic injury wound repair, initial encounter: Secondary | ICD-10-CM | POA: Diagnosis not present

## 2020-04-22 DIAGNOSIS — S81001A Unspecified open wound, right knee, initial encounter: Secondary | ICD-10-CM | POA: Diagnosis not present

## 2020-04-22 DIAGNOSIS — T8130XA Disruption of wound, unspecified, initial encounter: Secondary | ICD-10-CM | POA: Diagnosis not present

## 2020-04-22 DIAGNOSIS — I10 Essential (primary) hypertension: Secondary | ICD-10-CM | POA: Diagnosis not present

## 2020-04-22 DIAGNOSIS — M71161 Other infective bursitis, right knee: Secondary | ICD-10-CM | POA: Diagnosis not present

## 2020-04-22 DIAGNOSIS — S81002A Unspecified open wound, left knee, initial encounter: Secondary | ICD-10-CM | POA: Diagnosis not present

## 2020-04-22 DIAGNOSIS — E782 Mixed hyperlipidemia: Secondary | ICD-10-CM | POA: Diagnosis not present

## 2020-04-22 HISTORY — PX: INCISION AND DRAINAGE: SHX5863

## 2020-04-22 LAB — CBC
HCT: 35.2 % — ABNORMAL LOW (ref 36.0–46.0)
Hemoglobin: 11.1 g/dL — ABNORMAL LOW (ref 12.0–15.0)
MCH: 29.9 pg (ref 26.0–34.0)
MCHC: 31.5 g/dL (ref 30.0–36.0)
MCV: 94.9 fL (ref 80.0–100.0)
Platelets: 97 10*3/uL — ABNORMAL LOW (ref 150–400)
RBC: 3.71 MIL/uL — ABNORMAL LOW (ref 3.87–5.11)
RDW: 16 % — ABNORMAL HIGH (ref 11.5–15.5)
WBC: 15.1 10*3/uL — ABNORMAL HIGH (ref 4.0–10.5)
nRBC: 0 % (ref 0.0–0.2)

## 2020-04-22 LAB — COMPREHENSIVE METABOLIC PANEL
ALT: 22 U/L (ref 0–44)
AST: 30 U/L (ref 15–41)
Albumin: 3.9 g/dL (ref 3.5–5.0)
Alkaline Phosphatase: 39 U/L (ref 38–126)
Anion gap: 11 (ref 5–15)
BUN: 20 mg/dL (ref 8–23)
CO2: 26 mmol/L (ref 22–32)
Calcium: 9.6 mg/dL (ref 8.9–10.3)
Chloride: 105 mmol/L (ref 98–111)
Creatinine, Ser: 1 mg/dL (ref 0.44–1.00)
GFR calc Af Amer: >60 mL/min (ref >60)
GFR calc non Af Amer: 57 mL/min — ABNORMAL LOW (ref >60)
Glucose, Bld: 66 mg/dL — ABNORMAL LOW (ref 70–99)
Potassium: 3.3 mmol/L — ABNORMAL LOW (ref 3.5–5.1)
Sodium: 142 mmol/L (ref 135–145)
Total Bilirubin: 0.8 mg/dL (ref 0.3–1.2)
Total Protein: 6.6 g/dL (ref 6.5–8.1)

## 2020-04-22 LAB — PATHOLOGIST SMEAR REVIEW

## 2020-04-22 LAB — SURGICAL PCR SCREEN
MRSA, PCR: NEGATIVE
Staphylococcus aureus: POSITIVE — AB

## 2020-04-22 SURGERY — INCISION AND DRAINAGE
Anesthesia: General | Site: Knee | Laterality: Bilateral

## 2020-04-22 MED ORDER — ONDANSETRON HCL 4 MG PO TABS
4.0000 mg | ORAL_TABLET | Freq: Four times a day (QID) | ORAL | Status: DC | PRN
  Administered 2020-04-27: 4 mg via ORAL
  Filled 2020-04-22: qty 1

## 2020-04-22 MED ORDER — DIPHENHYDRAMINE HCL 50 MG/ML IJ SOLN
INTRAMUSCULAR | Status: AC
  Filled 2020-04-22: qty 1

## 2020-04-22 MED ORDER — FENTANYL CITRATE (PF) 100 MCG/2ML IJ SOLN
INTRAMUSCULAR | Status: AC
  Filled 2020-04-22: qty 2

## 2020-04-22 MED ORDER — MIDAZOLAM HCL 2 MG/2ML IJ SOLN
INTRAMUSCULAR | Status: AC
  Filled 2020-04-22: qty 2

## 2020-04-22 MED ORDER — LIDOCAINE 2% (20 MG/ML) 5 ML SYRINGE
INTRAMUSCULAR | Status: AC
  Filled 2020-04-22: qty 15

## 2020-04-22 MED ORDER — CHLORHEXIDINE GLUCONATE 0.12 % MT SOLN
15.0000 mL | Freq: Once | OROMUCOSAL | Status: AC
  Administered 2020-04-22: 15 mL via OROMUCOSAL

## 2020-04-22 MED ORDER — MIDAZOLAM HCL 5 MG/5ML IJ SOLN
INTRAMUSCULAR | Status: DC | PRN
  Administered 2020-04-22: 1 mg via INTRAVENOUS

## 2020-04-22 MED ORDER — DOCUSATE SODIUM 100 MG PO CAPS
100.0000 mg | ORAL_CAPSULE | Freq: Two times a day (BID) | ORAL | Status: DC
  Administered 2020-04-22 – 2020-04-28 (×12): 100 mg via ORAL
  Filled 2020-04-22 (×12): qty 1

## 2020-04-22 MED ORDER — LIDOCAINE 2% (20 MG/ML) 5 ML SYRINGE
INTRAMUSCULAR | Status: DC | PRN
  Administered 2020-04-22: 100 mg via INTRAVENOUS

## 2020-04-22 MED ORDER — CEFAZOLIN SODIUM-DEXTROSE 2-4 GM/100ML-% IV SOLN
INTRAVENOUS | Status: AC
  Filled 2020-04-22: qty 100

## 2020-04-22 MED ORDER — HYDROMORPHONE HCL 1 MG/ML IJ SOLN
0.2500 mg | INTRAMUSCULAR | Status: DC | PRN
  Administered 2020-04-22 (×2): 0.5 mg via INTRAVENOUS
  Administered 2020-04-22 (×2): 0.25 mg via INTRAVENOUS
  Administered 2020-04-22: 0.5 mg via INTRAVENOUS

## 2020-04-22 MED ORDER — POVIDONE-IODINE 10 % EX SWAB: 2.0000 "application " | Freq: Once | CUTANEOUS | Status: DC

## 2020-04-22 MED ORDER — ONDANSETRON HCL 4 MG/2ML IJ SOLN
4.0000 mg | Freq: Four times a day (QID) | INTRAMUSCULAR | Status: DC | PRN
  Administered 2020-04-23 – 2020-04-24 (×3): 4 mg via INTRAVENOUS
  Filled 2020-04-22 (×3): qty 2

## 2020-04-22 MED ORDER — PROPOFOL 10 MG/ML IV BOLUS
INTRAVENOUS | Status: AC
  Filled 2020-04-22: qty 20

## 2020-04-22 MED ORDER — HYDROCODONE-ACETAMINOPHEN 5-325 MG PO TABS
1.0000 | ORAL_TABLET | Freq: Four times a day (QID) | ORAL | Status: AC | PRN
  Administered 2020-04-22 – 2020-04-23 (×2): 1 via ORAL
  Filled 2020-04-22 (×2): qty 1

## 2020-04-22 MED ORDER — CEFAZOLIN SODIUM-DEXTROSE 2-4 GM/100ML-% IV SOLN: 2.0000 g | INTRAVENOUS | Status: DC

## 2020-04-22 MED ORDER — FENTANYL CITRATE (PF) 100 MCG/2ML IJ SOLN
INTRAMUSCULAR | Status: DC | PRN
  Administered 2020-04-22 (×2): 50 ug via INTRAVENOUS
  Administered 2020-04-22 (×2): 25 ug via INTRAVENOUS
  Administered 2020-04-22 (×3): 50 ug via INTRAVENOUS

## 2020-04-22 MED ORDER — CHLORHEXIDINE GLUCONATE 4 % EX LIQD
60.0000 mL | Freq: Once | CUTANEOUS | Status: AC
  Administered 2020-04-22: 4 via TOPICAL
  Filled 2020-04-22: qty 60

## 2020-04-22 MED ORDER — ONDANSETRON HCL 4 MG/2ML IJ SOLN
INTRAMUSCULAR | Status: DC | PRN
  Administered 2020-04-22 (×2): 4 mg via INTRAVENOUS

## 2020-04-22 MED ORDER — ENOXAPARIN SODIUM 40 MG/0.4ML ~~LOC~~ SOLN
40.0000 mg | SUBCUTANEOUS | Status: DC
  Administered 2020-04-23 – 2020-04-28 (×5): 40 mg via SUBCUTANEOUS
  Filled 2020-04-22 (×5): qty 0.4

## 2020-04-22 MED ORDER — DEXAMETHASONE SODIUM PHOSPHATE 10 MG/ML IJ SOLN
INTRAMUSCULAR | Status: AC
  Filled 2020-04-22: qty 1

## 2020-04-22 MED ORDER — HYDROMORPHONE HCL 1 MG/ML IJ SOLN
INTRAMUSCULAR | Status: AC
  Filled 2020-04-22: qty 1

## 2020-04-22 MED ORDER — HYDROCORTISONE NA SUCCINATE PF 100 MG IJ SOLR
INTRAMUSCULAR | Status: DC | PRN
  Administered 2020-04-22: 100 mg via INTRAVENOUS

## 2020-04-22 MED ORDER — PROPOFOL 10 MG/ML IV BOLUS
INTRAVENOUS | Status: DC | PRN
  Administered 2020-04-22: 200 mg via INTRAVENOUS

## 2020-04-22 MED ORDER — METOCLOPRAMIDE HCL 5 MG/ML IJ SOLN: 5.0000 mg | Freq: Three times a day (TID) | INTRAMUSCULAR | Status: DC | PRN

## 2020-04-22 MED ORDER — HYDROCORTISONE NA SUCCINATE PF 100 MG IJ SOLR
INTRAMUSCULAR | Status: AC
  Filled 2020-04-22: qty 2

## 2020-04-22 MED ORDER — 0.9 % SODIUM CHLORIDE (POUR BTL) OPTIME
TOPICAL | Status: DC | PRN
  Administered 2020-04-22: 1000 mL

## 2020-04-22 MED ORDER — SODIUM CHLORIDE 0.9 % IR SOLN
Status: DC | PRN
  Administered 2020-04-22 (×2): 3000 mL

## 2020-04-22 MED ORDER — ONDANSETRON HCL 4 MG/2ML IJ SOLN
INTRAMUSCULAR | Status: AC
  Filled 2020-04-22: qty 2

## 2020-04-22 MED ORDER — VANCOMYCIN HCL 1250 MG/250ML IV SOLN
1250.0000 mg | INTRAVENOUS | Status: AC
  Administered 2020-04-22 – 2020-04-24 (×3): 1250 mg via INTRAVENOUS
  Filled 2020-04-22 (×3): qty 250

## 2020-04-22 MED ORDER — LACTATED RINGERS IV SOLN: INTRAVENOUS | Status: DC

## 2020-04-22 MED ORDER — METOCLOPRAMIDE HCL 5 MG PO TABS: 5.0000 mg | ORAL_TABLET | Freq: Three times a day (TID) | ORAL | Status: DC | PRN

## 2020-04-22 MED ORDER — PHENYLEPHRINE 40 MCG/ML (10ML) SYRINGE FOR IV PUSH (FOR BLOOD PRESSURE SUPPORT)
PREFILLED_SYRINGE | INTRAVENOUS | Status: DC | PRN
  Administered 2020-04-22 (×2): 80 ug via INTRAVENOUS

## 2020-04-22 SURGICAL SUPPLY — 38 items
BAG ZIPLOCK 12X15 (MISCELLANEOUS) ×2 IMPLANT
BANDAGE ESMARK 6X9 LF (GAUZE/BANDAGES/DRESSINGS) ×1 IMPLANT
BNDG ELASTIC 6X10 VLCR STRL LF (GAUZE/BANDAGES/DRESSINGS) ×4 IMPLANT
BNDG ESMARK 6X9 LF (GAUZE/BANDAGES/DRESSINGS) ×2
CHLORAPREP W/TINT 26 (MISCELLANEOUS) ×2 IMPLANT
COVER SURGICAL LIGHT HANDLE (MISCELLANEOUS) ×2 IMPLANT
COVER WAND RF STERILE (DRAPES) IMPLANT
CUFF TOURN SGL QUICK 34 (TOURNIQUET CUFF) ×2
CUFF TRNQT CYL 34X4.125X (TOURNIQUET CUFF) ×1 IMPLANT
DRAIN PENROSE 0.5X18 (DRAIN) ×2 IMPLANT
DRAPE SHEET LG 3/4 BI-LAMINATE (DRAPES) ×6 IMPLANT
DRSG AQUACEL AG ADV 3.5X10 (GAUZE/BANDAGES/DRESSINGS) ×2 IMPLANT
DRSG PAD ABDOMINAL 8X10 ST (GAUZE/BANDAGES/DRESSINGS) ×2 IMPLANT
ELECT REM PT RETURN 15FT ADLT (MISCELLANEOUS) ×2 IMPLANT
GAUZE SPONGE 4X4 12PLY STRL (GAUZE/BANDAGES/DRESSINGS) ×2 IMPLANT
GAUZE XEROFORM 5X9 LF (GAUZE/BANDAGES/DRESSINGS) ×2 IMPLANT
GLOVE BIO SURGEON STRL SZ8.5 (GLOVE) ×4 IMPLANT
GLOVE BIOGEL PI IND STRL 8.5 (GLOVE) ×1 IMPLANT
GLOVE BIOGEL PI INDICATOR 8.5 (GLOVE) ×1
GOWN SPEC L3 XXLG W/TWL (GOWN DISPOSABLE) ×2 IMPLANT
HANDPIECE INTERPULSE COAX TIP (DISPOSABLE) ×2
IMMOBILIZER KNEE 20 (SOFTGOODS) ×4
IMMOBILIZER KNEE 20 THIGH 36 (SOFTGOODS) ×2 IMPLANT
KIT TURNOVER KIT A (KITS) IMPLANT
MANIFOLD NEPTUNE II (INSTRUMENTS) ×2 IMPLANT
PACK TOTAL JOINT (CUSTOM PROCEDURE TRAY) ×2 IMPLANT
PADDING CAST COTTON 6X4 STRL (CAST SUPPLIES) ×2 IMPLANT
PROTECTOR NERVE ULNAR (MISCELLANEOUS) ×2 IMPLANT
SET HNDPC FAN SPRY TIP SCT (DISPOSABLE) ×1 IMPLANT
STAPLER VISISTAT 35W (STAPLE) IMPLANT
SUT ETHILON 2 0 PSLX (SUTURE) ×6 IMPLANT
SUT MNCRL AB 4-0 PS2 18 (SUTURE) IMPLANT
SUT VIC AB 1 CT1 36 (SUTURE) IMPLANT
SUT VIC AB 2-0 CT1 27 (SUTURE)
SUT VIC AB 2-0 CT1 TAPERPNT 27 (SUTURE) IMPLANT
SWAB COLLECTION DEVICE MRSA (MISCELLANEOUS) ×4 IMPLANT
SWAB CULTURE ESWAB REG 1ML (MISCELLANEOUS) ×4 IMPLANT
TOWEL OR 17X26 10 PK STRL BLUE (TOWEL DISPOSABLE) ×2 IMPLANT

## 2020-04-22 NOTE — Op Note (Signed)
OPERATIVE REPORT   04/22/2020  1:49 PM  PATIENT:  Morgan [name]   SURGEON:  Bertram Savin, MD  ASSISTANT:  Staff.   PREOPERATIVE DIAGNOSIS:  1.  Bilateral knee wound. 2.  Chronic septic prepatellar bursitis bilateral knees.  POSTOPERATIVE DIAGNOSIS:  Same.  PROCEDURE: 1.  Excisional debridement of skin and subcutaneous tissue bilateral knees. 2.  Closure of 5 cm wound over JP drain right knee. 3.  Application of wound VAC to left knee wound totaling 11 cm.  ANESTHESIA:   GETA.  ANTIBIOTICS: Receiving scheduled IV Zosyn and vancomycin.  IMPLANTS: None.  SPECIMENS:  1.  Left knee bursa fluid for aerobic, anaerobic, AFB, and fungus culture. 2.  Left knee bursal tissue for culture. 3.  Right knee bursa fluid for aerobic, anaerobic, AFB, and fungus culture. 4.  Right knee bursal tissue for culture.  COMPLICATIONS: None.  DISPOSITION: Stable to PACU.  SURGICAL INDICATIONS:  Morgan [name] is a 69 y.o. female with a history of rheumatoid arthritis on a biologic and prednisone who fell on both of her knees about 5 weeks ago.  On the left side, she had a transverse laceration that required repair.  She had an abrasion on the right side.  She states that about 2 weeks later, she developed infections on both sides.  She has seen multiple doctors and has been on 3 different antibiotics.  She ultimately saw Dr. Theda Sers in the office yesterday, and he arranged for admission to the hospital under the hospitalist service.  She was started on IV vancomycin and Zosyn.  I was asked to perform the initial debridement by Dr. Theda Sers.  She was indicated for debridement of bilateral knee wounds.  We discussed the risk, benefits, and alternatives.  PROCEDURE IN DETAIL: The patient was identified in the holding areas and 2 identifiers.  The surgical sites were marked by myself.  She was taken to the operating room, positioned on the operating room table.  General anesthesia was induced.  All  bony prominences were well-padded.  Bilateral lower extremities were prepped and draped in the normal sterile surgical fashion.  Timeout was called, verifying site and site of surgery.  I began with the right knee.  She had a subcentimeter draining sinus tract just medial to the patellar tendon.  Using a #10 blade, I ellipsed this wound out in a longitudinal fashion.  There was some necrotic bursal tissue, which I sent for tissue culture.  Bursal fluid was also swabbed for culture.  Using a rongeur I performed an excisional debridement of all necrotic subcutaneous and bursal tissue.  There was extensive degloving of the skin medially.  Once I was satisfied with the debridement, I then used 3 L of normal saline to irrigate using cystoscopy tubing.  Meticulous hemostasis was achieved with Bovie electrocautery.  The proximal and distal ends of the wound were closed with 2-0 nylon suture.  I left the center portion which corresponded to her original wound open and I placed 1/2 inch Penrose drain.  Dressing was placed with Xeroform, 4 x 4's, ABDs, cast padding and Ace wrap.  I then turned my attention to the left knee.  Using a #10 blade, I excised the entire previous laceration.  There was a small amount of necrotic bursal tissue which I sent for culture.  Bursal fluid was also sent for culture.  Using a rongeur, I performed an excisional debridement of a small amount of necrotic bursal and subcutaneous tissue.  Once I was  satisfied with the debridement, I irrigated 3 L of normal saline using cystoscopy tubing.  Meticulous hemostasis was achieved using Bovie electrocautery.  Black granufoam sponge was placed into the wound.  I loosely approximated the wound edges with 2-0 nylon suture to make later closure easier.  Wound VAC was applied according to manufacturer's instructions.  Suction was applied at 125 mmHg with excellent seal.  Ace wrap was applied.  Patient was then extubated, taken to the PACU in stable  condition.  Sponge, needle, and instrument counts were correct in the case x2.  There were no known complications.  POSTOPERATIVE PLAN: Weightbearing as tolerated bilateral lower extremities.  Knee immobilizer at all times bilaterally.  Lovenox for DVT prophylaxis. Mobilize with PT bed to chair transfers.  Continue IV vancomycin and Zosyn for now.  Follow intraoperative cultures and narrow antibiotics as appropriate.  Recommend ID consult for antibiotic selection and duration.  Plan to return to the operating room with Dr. Theda Sers on Sunday for I&D and closure of the left knee.  At the same time, the Penrose drain can be removed on the right side.  Dressing changes to the right knee as needed.

## 2020-04-22 NOTE — Anesthesia Procedure Notes (Signed)
Procedure Name: [place] Performed by: West Pugh, CRNA Pre-anesthesia Checklist: Patient identified, Emergency Drugs available, Suction available, Patient being monitored and Timeout performed Patient Re-evaluated:Patient Re-evaluated prior to induction Oxygen Delivery Method: Circle system utilized Preoxygenation: Pre-oxygenation with 100% oxygen Induction Type: IV induction LMA: LMA with gastric port inserted LMA Size: 4.0 Number of attempts: 1 Placement Confirmation: positive ETCO2 and breath sounds checked- equal and bilateral Tube secured with: Tape Dental Injury: Teeth and Oropharynx as per pre-operative assessment

## 2020-04-22 NOTE — Transfer of Care (Signed)
Immediate Anesthesia Transfer of Care Note  Patient: Morgan [name]  Procedure(s) Performed: INCISION AND DRAINAGE, WOUND VAC PLACEMENT ON LEFT KNEE (Bilateral Knee)  Patient Location: PACU  Anesthesia Type:General  Level of Consciousness: awake, alert  and patient cooperative  Airway & Oxygen Therapy: Patient Spontanous Breathing and Patient connected to face mask oxygen  Post-op Assessment: Report given to RN and Post -op Vital signs reviewed and stable  Post vital signs: Reviewed and stable  Last Vitals:  Vitals Value Taken Time  BP 150/68 04/22/20 1345  Temp    Pulse 71 04/22/20 1348  Resp 8 04/22/20 1348  SpO2 100 % 04/22/20 1348  Vitals shown include unvalidated device data.  Last Pain:  Vitals:   04/22/20 1107  TempSrc: Oral  PainSc:          Complications: No apparent anesthesia complications

## 2020-04-22 NOTE — Progress Notes (Signed)
Morgan [name]  [id number] DOB: 01/20/1951 DOA: 04/21/2020 PCP: [name], MD    Brief Narrative:  69 year old with a history of HTN, HLD, RA, and psoriatic arthritis who presented to the ED on referral from her orthopedist for evaluation of bilateral knee wounds.  She fell down steps a month and a half prior to this presentation and suffered bilateral knee wounds as a result.  She required stitches to the left knee at the time.  Despite 1-2 rounds of outpatient antibiotics her knee wounds have become more inflamed and irritated.  She was referred to orthopedic surgery by her primary care physician who upon seeing her in the clinic referred her to the ED to be admitted for planned bilateral knee washout procedures.  Significant Events: 5/20 admit via [facility]  Antimicrobials:  Zosyn 5/20 > Vancomycin 5/20 >   Subjective: Doing quite well post-op. States her pain is well controlled. Denies cp, n/v, or abdom pain.   Assessment & Plan:  Cellulitis/bilateral knee wounds -septic prepatellar bursitis Continue empiric antibiotics -operative management per orthopedic surgery  Hypokalemia Supplement and follow trend   Normocytic anemia Likely ACD due to smoldering infection - follow trend   HTN BP reasonably controlled - follow   Rheumatoid arthritis/psoriatic arthritis Treated with injectable biologic, prednisone, and Plaquenil  HLD  DVT prophylaxis: SCDs Code Status: FULL CODE Family Communication: spoke w/ husband at bedside  Status is: Inpatient  Remains inpatient appropriate because:IV treatments appropriate due to intensity of illness or inability to take PO   Dispo: The patient is from: Home              Anticipated d/c is to: Home              Anticipated d/c date is: > 3 days              Patient currently is not medically stable to d/c.  Consultants:  Orthopedics   Objective: Blood pressure (!) 115/50, pulse 68, temperature 98 F (36.7 C),  temperature source Oral, resp. rate 18, weight 99.3 kg, SpO2 96 %. No intake or output data in the 24 hours ending 04/22/20 0944 Filed Weights   04/22/20 0543  Weight: 99.3 kg    Examination: General: No acute respiratory distress Lungs: Clear to auscultation bilaterally without wheezes or crackles Cardiovascular: Regular rate and rhythm without murmur gallop or rub normal S1 and S2 Abdomen: Nontender, nondistended, soft, bowel sounds positive, no rebound, no ascites, no appreciable mass Extremities: trace B LE edema   CBC: Recent Labs  Lab 04/21/20 1443 04/22/20 0602  WBC 21.1* 15.1*  NEUTROABS 12.2*  --   HGB 11.4* 11.1*  HCT 35.7* 35.2*  MCV 93.9 94.9  PLT 111* 97*   Basic Metabolic Panel: Recent Labs  Lab 04/21/20 1443 04/22/20 0602  NA 143 142  K 3.8 3.3*  CL 105 105  CO2 26 26  GLUCOSE 120* 66*  BUN 24* 20  CREATININE 0.87 1.00  CALCIUM 9.6 9.6  MG 1.7  --    GFR: Estimated Creatinine Clearance: 59.1 mL/min (by C-G formula based on SCr of 1 mg/dL).  Liver Function Tests: Recent Labs  Lab 04/21/20 1443 04/22/20 0602  AST 31 30  ALT 22 22  ALKPHOS 53 39  BILITOT 0.7 0.8  PROT 7.3 6.6  ALBUMIN 4.2 3.9    Coagulation Profile: Recent Labs  Lab 04/21/20 1443  INR 1.1    HbA1C: Hgb A1c MFr Bld  Date/Time  Value Ref Range Status  12/11/2017 07:10 AM 5.4 4.8 - 5.6 % Final    Comment:    (NOTE) Pre diabetes:          5.7%-6.4% Diabetes:              >6.4% Glycemic control for   <7.0% adults with diabetes      Recent Results (from the past 240 hour(s))  SARS Coronavirus 2 by RT PCR (hospital order, performed in [facility] hospital lab) Nasopharyngeal Nasopharyngeal Swab     Status: None   Collection Time: 04/21/20  2:43 PM   Specimen: Nasopharyngeal Swab  Result Value Ref Range Status   SARS Coronavirus 2 NEGATIVE NEGATIVE Final    Comment: (NOTE) SARS-CoV-2 target nucleic acids are NOT DETECTED. The SARS-CoV-2 RNA is generally  detectable in upper and lower respiratory specimens during the acute phase of infection. The lowest concentration of SARS-CoV-2 viral copies this assay can detect is 250 copies / mL. A negative result does not preclude SARS-CoV-2 infection and should not be used as the sole basis for treatment or other patient management decisions.  A negative result may occur with improper specimen collection / handling, submission of specimen other than nasopharyngeal swab, presence of viral mutation(s) within the areas targeted by this assay, and inadequate number of viral copies (<250 copies / mL). A negative result must be combined with clinical observations, patient history, and epidemiological information. Fact Sheet for Patients:   StrictlyIdeas.no Fact Sheet for Healthcare Providers: BankingDealers.co.za This test is not yet approved or cleared  by the [place] FDA and has been authorized for detection and/or diagnosis of SARS-CoV-2 by FDA under an Emergency Use Authorization (EUA).  This EUA will remain in effect (meaning this test can be used) for the duration of the COVID-19 declaration under Section 564(b)(1) of the Act, 21 U.S.C. section 360bbb-3(b)(1), unless the authorization is terminated or revoked sooner. Performed at [facility], [place] [address], [place], [place] [address]   Surgical PCR screen     Status: Abnormal   Collection Time: 04/22/20  5:35 AM   Specimen: Nasal Mucosa; Nasal Swab  Result Value Ref Range Status   MRSA, PCR NEGATIVE NEGATIVE Final   Staphylococcus aureus POSITIVE (A) NEGATIVE Final    Comment: (NOTE) The Xpert SA Assay (FDA approved for NASAL specimens in patients 34 years of age and older), is one component of a comprehensive surveillance program. It is not intended to diagnose infection nor to guide or monitor treatment. Performed at [facility], [place] [address], [place], [place] [address]      Scheduled Meds: . Derrill Memo ON 04/25/2020] aspirin EC  81 mg Oral Once per day on Mon Wed Fri  . calcium-vitamin D  1 tablet Oral BID  . chlorhexidine  60 mL Topical Once  . cholecalciferol  2,000 Units Oral Daily  . doxepin  10 mg Oral BID  . fenofibrate  160 mg Oral QHS  . hydrochlorothiazide  25 mg Oral Daily  . multivitamin with minerals  1 tablet Oral Daily  . mupirocin ointment  1 application Nasal BID  . predniSONE  5 mg Oral Daily  . simvastatin  40 mg Oral QPM   Continuous Infusions: . sodium chloride 75 mL/hr at 04/21/20 1850  . piperacillin-tazobactam (ZOSYN)  IV 3.375 g (04/22/20 0824)  . vancomycin 1,000 mg (04/22/20 [id number])     LOS: 1 day   Cherene Altes, MD [facility] Office  [phone] Pager - Text Page per Shea Evans  If 7PM-7AM, please contact night-coverage per Amion 04/22/2020, 9:44 AM

## 2020-04-22 NOTE — H&P (View-Only) (Signed)
ORTHOPAEDIC CONSULTATION  REQUESTING PHYSICIAN: Cherene Altes, MD  PCP:  [name], MD  Chief Complaint: bilateral knee wounds  HPI: Morgan [name] is a 69 y.o. female with a history of rheumatoid arthritis on an injectable biologic, prednisone, and Plaquenil who fell about 5 weeks ago.  She sustained an abrasion to the right knee and a large transverse laceration to the left knee.  The left side was repaired with sutures.  She states that about 2 weeks later, she developed an infection bilaterally.  She has been treated with up to 3 different antibiotics.  She is currently on doxycycline.  She saw her rheumatologist earlier this week, who recommended orthopedic referral.  She was seen in the office yesterday by Dr. Theda Sers.  Due to her persistent bilateral knee infection, she was sent to the emergency department and admitted by the hospitalist for perioperative risk ratification and medical optimization.  She was started on IV Zosyn and vancomycin.  Orthopedic consultation was placed for management of bilateral knees.  Past Medical History:  Diagnosis Date  . Abnormal chest x-ray   . Abnormal electrocardiogram   . Abscess of sigmoid colon due to diverticulitis 11/04/2017  . Allergic rhinitis   . Allergy   . Cancer ([place])    Skin non melanoma  . Cataract    forming   . Colon polyp    polypoid colorectal mucosa/adenomatous  . Complication of anesthesia    and cries  . Contact dermatitis due to poison ivy   . Cystitis   . Diverticulosis of colon   . Fatty liver disease, nonalcoholic   . GERD (gastroesophageal reflux disease)    occasional  . Hearing loss   . Hemorrhoids   . Hyperlipidemia   . Hypertension   . Lymphocytosis   . Overweight(278.02)   . Pneumonia    2017  . PONV (postoperative nausea and vomiting)   . RA (rheumatoid arthritis) (H[facility])    Rheumatoid  . Venous insufficiency    Past Surgical History:  Procedure Laterality Date  . CESAREAN SECTION      [phone]  . chemical and laser endovenous ablation of LE veins  2008, 2009, 2010, 2011   Dr Eilleen Kempf et al  . COLONOSCOPY    . FOOT SURGERY     left  . INNER EAR SURGERY Right    childhood; right, ear drum repair  . LAPAROSCOPIC LYSIS OF ADHESIONS N/A 12/11/2017   Procedure: LAPAROSCOPIC LYSIS OF ADHESIONS;  Surgeon: Michael Boston, MD;  Location: [facility];  Service: General;  Laterality: N/A;  . NASAL SEPTUM SURGERY    . POLYPECTOMY    . PROCTOSCOPY N/A 12/11/2017   Procedure: RIDGED PROCTOSCOPY;  Surgeon: Michael Boston, MD;  Location: [facility];  Service: General;  Laterality: N/A;  . REVERSE SHOULDER ARTHROPLASTY Right 09/10/2019   Procedure: REVERSE SHOULDER ARTHROPLASTY;  Surgeon: Justice Britain, MD;  Location: [facility];  Service: Orthopedics;  Laterality: Right;  155min  . TOTAL ABDOMINAL HYSTERECTOMY  12/86  . TYMPANOPLASTY Right 11/03/2018   Procedure: TYMPANOPLASTY;  Surgeon: Melida Quitter, MD;  Location: [facility];  Service: ENT;  Laterality: Right;   Social History   Socioeconomic History  . Marital status: Married    Spouse name: [name]  . Number of children: 4  . Years of education: Not on file  . Highest education level: Not on file  Occupational History  . Occupation: Community education officer: [employer]  Tobacco  Use  . Smoking status: Former Smoker    Packs/day: 0.50    Years: 15.00    Pack years: 7.50    Types: Cigarettes    Quit date: 12/03/2001    Years since quitting: 18.3  . Smokeless tobacco: Never Used  Substance and Sexual Activity  . Alcohol use: Yes    Comment: occasional  . Drug use: No  . Sexual activity: Not Currently  Other Topics Concern  . Not on file  Social History Narrative   Married to [place], with children and grandchildren. 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 Difficulty of Paying Living Expenses:   Food Insecurity:   .  Worried About Charity fundraiser in the Last Year:   . Arboriculturist in the Last Year:   Transportation Needs:   . Film/video editor (Medical):   Marland Kitchen Lack of Transportation (Non-Medical):   Physical Activity:   . Days of Exercise per Week:   . Minutes of Exercise per Session:   Stress:   . Feeling of Stress :   Social Connections:   . Frequency of Communication with Friends and Family:   . Frequency of Social Gatherings with Friends and Family:   . Attends Religious Services:   . Active Member of Clubs or Organizations:   . Attends Archivist Meetings:   Marland Kitchen Marital Status:    Family History  Problem Relation Age of Onset  . Hyperlipidemia Mother   . Hypertension Mother   . COPD Mother   . Arthritis Mother   . Heart disease Mother   . Atrial fibrillation Mother   . Diabetes Sister   . Healthy Sister   . Heart disease Sister        sister #1  . Breast cancer Other        half-sister  . Liver disease Sister        sister #1 - hx of liver transplant 14+ yrs ago  . Diabetes Maternal Uncle   . Heart failure Brother   . Diabetes Maternal Aunt        x 2  . Heart failure Son   . Colon cancer Neg Hx   . Colon polyps Neg Hx   . Esophageal cancer Neg Hx   . Rectal cancer Neg Hx   . Stomach cancer Neg Hx    Allergies  Allergen Reactions  . Remicade [Infliximab] Swelling and Other (See Comments)    Facial swelling, throat was closing up.    Prior to Admission medications   Medication Sig Start Date End Date Taking? Authorizing Provider  acetaminophen (TYLENOL) 500 MG tablet Take 500 mg by mouth every 6 (six) hours as needed for moderate pain or headache.    Yes [provider]  aspirin 81 MG tablet Take 81 mg by mouth 3 (three) times a week. MWF   Yes [provider]  Calcium Carbonate-Vitamin D 600-400 MG-UNIT tablet Take 1 tablet by mouth 2 (two) times daily.    Yes [provider]  Cholecalciferol (VITAMIN D) 50 MCG (2000 UT) CAPS  Take 2,000 Units by mouth daily.    Yes [provider]  clobetasol ointment (TEMOVATE) [id number] % Apply 1 application topically 2 (two) times daily as needed (rash).  10/22/19  Yes [provider]  COSENTYX SENSOREADY, 300 MG, 150 MG/ML SOAJ Inject 300 mg as directed.  Once every 4 weeks 11/16/19  Yes [provider]  doxepin (SINEQUAN) 10 MG capsule Take 10 mg by mouth 2 (two) times daily.  12/31/18  Yes [provider]  doxycycline (VIBRA-TABS) 100 MG tablet Take 1 tablet (100 mg total) by mouth 2 (two) times daily for 7 days. 04/15/20 04/22/20 Yes [name], MD  fenofibrate 160 MG tablet TAKE 1 TABLET (160 MG TOTAL) BY MOUTH AT BEDTIME. 03/09/20  Yes [name], MD  fexofenadine (ALLEGRA) 180 MG tablet Take 180 mg by mouth daily.   Yes [provider]  fluticasone (FLONASE) 50 MCG/ACT nasal spray Place 2 sprays into both nostrils daily as needed for allergies. 02/15/20  Yes [name], MD  hydrochlorothiazide (HYDRODIURIL) 25 MG tablet TAKE 1 TABLET BY MOUTH EVERY DAY 03/10/20  Yes [name], MD  hydroxychloroquine (PLAQUENIL) 200 MG tablet Take 400 mg by mouth daily.    Yes [provider]  Hypromellose (ARTIFICIAL TEARS OP) Place 1 drop into both eyes daily as needed (for dry eyes).   Yes [provider]  ibuprofen (ADVIL) 200 MG tablet Take 200 mg by mouth every 6 (six) hours as needed for moderate pain.   Yes [provider]  Multiple Vitamin (MULTIVITAMIN) tablet Take 1 tablet by mouth daily.     Yes [provider]  naproxen sodium (ALEVE) 220 MG tablet Take 220 mg by mouth daily as needed (pain).   Yes [provider]  predniSONE (DELTASONE) 5 MG tablet Take 5 mg by mouth daily. 08/24/14  Yes [provider]  simvastatin (ZOCOR) 40 MG tablet TAKE 1 TABLET BY MOUTH EVERYDAY AT BEDTIME Patient taking differently: Take 40 mg by mouth every evening.  11/20/19  Yes [name], MD    No results found.  Positive ROS: All other systems have been reviewed and were otherwise negative with the exception of those mentioned in the HPI and as above.  Physical Exam: General: Alert, no acute distress Cardiovascular: No pedal edema Respiratory: No cyanosis, no use of accessory musculature GI: No organomegaly, abdomen is soft and non-tender Skin: No lesions in the area of chief complaint Neurologic: Sensation intact distally Psychiatric: Patient is competent for consent with normal mood and affect Lymphatic: No axillary or cervical lymphadenopathy  MUSCULOSKELETAL:   RLE: She has a subcentimeter draining sinus tract just inferior and medial to the patella.  There is surrounding erythema and swelling.  I am able to express seropurulent material.  She clearly has necrotic tissue in the prepatellar bursa.  Range of motion testing not performed due to active infection.  She can perform a straight leg raise.  No effusion.  LLE: There is a transverse laceration over the patella, totaling 7 cm.  The medial 3 centers is approximated with eschar.  The lateral 4 cm is dehisced.  There is necrotic prepatellar bursal tissue within the deep wound.  I am able to express purulent material.  Range of motion testing not performed due to anterior soft tissue compromise and infection.  She can perform a straight leg raise.  No effusion.  Neurovascularly intact  Assessment: Bilateral knee wounds with septic prepatellar bursitis, chronic. Rheumatoid arthritis. Immunocompromise host.  Plan: I discussed the findings with the patient.  She is going to require bilateral knee I&D's.  This is a challenging problem.  On the right side, the plan will be for I&D and closure over Penrose drain.  On the left side, she will need an extensive  debridement with delayed closure.  Plan for surgery today.  N.p.o.  All questions solicited and answered.    Bertram Savin, MD [phone]     04/22/2020 7:42 AM

## 2020-04-22 NOTE — Consult Note (Signed)
ORTHOPAEDIC CONSULTATION  REQUESTING PHYSICIAN: Cherene Altes, MD  PCP:  [name], MD  Chief Complaint: bilateral knee wounds  HPI: Morgan [name] is a 69 y.o. female with a history of rheumatoid arthritis on an injectable biologic, prednisone, and Plaquenil who fell about 5 weeks ago.  She sustained an abrasion to the right knee and a large transverse laceration to the left knee.  The left side was repaired with sutures.  She states that about 2 weeks later, she developed an infection bilaterally.  She has been treated with up to 3 different antibiotics.  She is currently on doxycycline.  She saw her rheumatologist earlier this week, who recommended orthopedic referral.  She was seen in the office yesterday by Dr. Theda Sers.  Due to her persistent bilateral knee infection, she was sent to the emergency department and admitted by the hospitalist for perioperative risk ratification and medical optimization.  She was started on IV Zosyn and vancomycin.  Orthopedic consultation was placed for management of bilateral knees.  Past Medical History:  Diagnosis Date  . Abnormal chest x-ray   . Abnormal electrocardiogram   . Abscess of sigmoid colon due to diverticulitis 11/04/2017  . Allergic rhinitis   . Allergy   . Cancer ([place])    Skin non melanoma  . Cataract    forming   . Colon polyp    polypoid colorectal mucosa/adenomatous  . Complication of anesthesia    and cries  . Contact dermatitis due to poison ivy   . Cystitis   . Diverticulosis of colon   . Fatty liver disease, nonalcoholic   . GERD (gastroesophageal reflux disease)    occasional  . Hearing loss   . Hemorrhoids   . Hyperlipidemia   . Hypertension   . Lymphocytosis   . Overweight(278.02)   . Pneumonia    2017  . PONV (postoperative nausea and vomiting)   . RA (rheumatoid arthritis) ([facility])    Rheumatoid  . Venous insufficiency    Past Surgical History:  Procedure Laterality Date  . CESAREAN SECTION      [phone]  . chemical and laser endovenous ablation of LE veins  2008, 2009, 2010, 2011   Dr Eilleen Kempf et al  . COLONOSCOPY    . FOOT SURGERY     left  . INNER EAR SURGERY Right    childhood; right, ear drum repair  . LAPAROSCOPIC LYSIS OF ADHESIONS N/A 12/11/2017   Procedure: LAPAROSCOPIC LYSIS OF ADHESIONS;  Surgeon: Michael Boston, MD;  Location: [facility];  Service: General;  Laterality: N/A;  . NASAL SEPTUM SURGERY    . POLYPECTOMY    . PROCTOSCOPY N/A 12/11/2017   Procedure: RIDGED PROCTOSCOPY;  Surgeon: Michael Boston, MD;  Location: [facility];  Service: General;  Laterality: N/A;  . REVERSE SHOULDER ARTHROPLASTY Right 09/10/2019   Procedure: REVERSE SHOULDER ARTHROPLASTY;  Surgeon: Justice Britain, MD;  Location: [facility];  Service: Orthopedics;  Laterality: Right;  156min  . TOTAL ABDOMINAL HYSTERECTOMY  12/86  . TYMPANOPLASTY Right 11/03/2018   Procedure: TYMPANOPLASTY;  Surgeon: Melida Quitter, MD;  Location: [facility];  Service: ENT;  Laterality: Right;   Social History   Socioeconomic History  . Marital status: Married    Spouse name: [name]  . Number of children: 4  . Years of education: Not on file  . Highest education level: Not on file  Occupational History  . Occupation: Community education officer: [employer]  Tobacco  Use  . Smoking status: Former Smoker    Packs/day: 0.50    Years: 15.00    Pack years: 7.50    Types: Cigarettes    Quit date: 12/03/2001    Years since quitting: 18.3  . Smokeless tobacco: Never Used  Substance and Sexual Activity  . Alcohol use: Yes    Comment: occasional  . Drug use: No  . Sexual activity: Not Currently  Other Topics Concern  . Not on file  Social History Narrative   Married to Prentice, with children and grandchildren. 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 Difficulty of Paying Living Expenses:   Food Insecurity:   .  Worried About Charity fundraiser in the Last Year:   . Arboriculturist in the Last Year:   Transportation Needs:   . Film/video editor (Medical):   Marland Kitchen Lack of Transportation (Non-Medical):   Physical Activity:   . Days of Exercise per Week:   . Minutes of Exercise per Session:   Stress:   . Feeling of Stress :   Social Connections:   . Frequency of Communication with Friends and Family:   . Frequency of Social Gatherings with Friends and Family:   . Attends Religious Services:   . Active Member of Clubs or Organizations:   . Attends Archivist Meetings:   Marland Kitchen Marital Status:    Family History  Problem Relation Age of Onset  . Hyperlipidemia Mother   . Hypertension Mother   . COPD Mother   . Arthritis Mother   . Heart disease Mother   . Atrial fibrillation Mother   . Diabetes Sister   . Healthy Sister   . Heart disease Sister        sister #1  . Breast cancer Other        half-sister  . Liver disease Sister        sister #1 - hx of liver transplant 14+ yrs ago  . Diabetes Maternal Uncle   . Heart failure Brother   . Diabetes Maternal Aunt        x 2  . Heart failure Son   . Colon cancer Neg Hx   . Colon polyps Neg Hx   . Esophageal cancer Neg Hx   . Rectal cancer Neg Hx   . Stomach cancer Neg Hx    Allergies  Allergen Reactions  . Remicade [Infliximab] Swelling and Other (See Comments)    Facial swelling, throat was closing up.    Prior to Admission medications   Medication Sig Start Date End Date Taking? Authorizing Provider  acetaminophen (TYLENOL) 500 MG tablet Take 500 mg by mouth every 6 (six) hours as needed for moderate pain or headache.    Yes [provider]  aspirin 81 MG tablet Take 81 mg by mouth 3 (three) times a week. MWF   Yes [provider]  Calcium Carbonate-Vitamin D 600-400 MG-UNIT tablet Take 1 tablet by mouth 2 (two) times daily.    Yes [provider]  Cholecalciferol (VITAMIN D) 50 MCG (2000 UT) CAPS  Take 2,000 Units by mouth daily.    Yes [provider]  clobetasol ointment (TEMOVATE) [id number] % Apply 1 application topically 2 (two) times daily as needed (rash).  10/22/19  Yes [provider]  COSENTYX SENSOREADY, 300 MG, 150 MG/ML SOAJ Inject 300 mg as directed.  Once every 4 weeks 11/16/19  Yes [provider]  doxepin (SINEQUAN) 10 MG capsule Take 10 mg by mouth 2 (two) times daily.  12/31/18  Yes [provider]  doxycycline (VIBRA-TABS) 100 MG tablet Take 1 tablet (100 mg total) by mouth 2 (two) times daily for 7 days. 04/15/20 04/22/20 Yes [name], MD  fenofibrate 160 MG tablet TAKE 1 TABLET (160 MG TOTAL) BY MOUTH AT BEDTIME. 03/09/20  Yes [name], MD  fexofenadine (ALLEGRA) 180 MG tablet Take 180 mg by mouth daily.   Yes [provider]  fluticasone (FLONASE) 50 MCG/ACT nasal spray Place 2 sprays into both nostrils daily as needed for allergies. 02/15/20  Yes [name], MD  hydrochlorothiazide (HYDRODIURIL) 25 MG tablet TAKE 1 TABLET BY MOUTH EVERY DAY 03/10/20  Yes [name], MD  hydroxychloroquine (PLAQUENIL) 200 MG tablet Take 400 mg by mouth daily.    Yes [provider]  Hypromellose (ARTIFICIAL TEARS OP) Place 1 drop into both eyes daily as needed (for dry eyes).   Yes [provider]  ibuprofen (ADVIL) 200 MG tablet Take 200 mg by mouth every 6 (six) hours as needed for moderate pain.   Yes [provider]  Multiple Vitamin (MULTIVITAMIN) tablet Take 1 tablet by mouth daily.     Yes [provider]  naproxen sodium (ALEVE) 220 MG tablet Take 220 mg by mouth daily as needed (pain).   Yes [provider]  predniSONE (DELTASONE) 5 MG tablet Take 5 mg by mouth daily. 08/24/14  Yes [provider]  simvastatin (ZOCOR) 40 MG tablet TAKE 1 TABLET BY MOUTH EVERYDAY AT BEDTIME Patient taking differently: Take 40 mg by mouth every evening.  11/20/19  Yes [name], MD    No results found.  Positive ROS: All other systems have been reviewed and were otherwise negative with the exception of those mentioned in the HPI and as above.  Physical Exam: General: Alert, no acute distress Cardiovascular: No pedal edema Respiratory: No cyanosis, no use of accessory musculature GI: No organomegaly, abdomen is soft and non-tender Skin: No lesions in the area of chief complaint Neurologic: Sensation intact distally Psychiatric: Patient is competent for consent with normal mood and affect Lymphatic: No axillary or cervical lymphadenopathy  MUSCULOSKELETAL:   RLE: She has a subcentimeter draining sinus tract just inferior and medial to the patella.  There is surrounding erythema and swelling.  I am able to express seropurulent material.  She clearly has necrotic tissue in the prepatellar bursa.  Range of motion testing not performed due to active infection.  She can perform a straight leg raise.  No effusion.  LLE: There is a transverse laceration over the patella, totaling 7 cm.  The medial 3 centers is approximated with eschar.  The lateral 4 cm is dehisced.  There is necrotic prepatellar bursal tissue within the deep wound.  I am able to express purulent material.  Range of motion testing not performed due to anterior soft tissue compromise and infection.  She can perform a straight leg raise.  No effusion.  Neurovascularly intact  Assessment: Bilateral knee wounds with septic prepatellar bursitis, chronic. Rheumatoid arthritis. Immunocompromise host.  Plan: I discussed the findings with the patient.  She is going to require bilateral knee I&D's.  This is a challenging problem.  On the right side, the plan will be for I&D and closure over Pen[place] drain.  On the left side, she will need an extensive  debridement with delayed closure.  Plan for surgery today.  N.p.o.  All questions solicited and answered.    Bertram Savin, MD [phone]     04/22/2020 7:42 AM

## 2020-04-22 NOTE — Anesthesia Preprocedure Evaluation (Signed)
Anesthesia Evaluation  Patient identified by MRN, date of birth, ID band Patient awake    Reviewed: Allergy & Precautions, NPO status   History of Anesthesia Complications (+) PONV  Airway Mallampati: II  TM Distance: >3 FB     Dental   Pulmonary pneumonia, former smoker,    breath sounds clear to auscultation       Cardiovascular hypertension,  Rhythm:Regular Rate:Normal     Neuro/Psych    GI/Hepatic Neg liver ROS, GERD  ,  Endo/Other  negative endocrine ROS  Renal/GU negative Renal ROS     Musculoskeletal   Abdominal   Peds  Hematology   Anesthesia Other Findings   Reproductive/Obstetrics                             Anesthesia Physical Anesthesia Plan  ASA: III  Anesthesia Plan: General   Post-op Pain Management:    Induction: Intravenous  PONV Risk Score and Plan: 4 or greater and Ondansetron, Midazolam and Dexamethasone  Airway Management Planned: LMA  Additional Equipment:   Intra-op Plan:   Post-operative Plan:   Informed Consent: I have reviewed the patients History and Physical, chart, labs and discussed the procedure including the risks, benefits and alternatives for the proposed anesthesia with the patient or authorized representative who has indicated his/her understanding and acceptance.     Dental advisory given  Plan Discussed with: Anesthesiologist and CRNA  Anesthesia Plan Comments:         Anesthesia Quick Evaluation

## 2020-04-22 NOTE — Progress Notes (Signed)
Pharmacy Antibiotic Note  Morgan [name] is a 69 y.o. female admitted on 04/21/2020 with B knee cellulitis.  Pharmacy has been consulted for vancomycin and zosyn dosing.  04/22/2020: -Day#2 abx -Afeb, WBC trending down -Plan for bilateral I&D today -Scr increased 0.87>1.0 (I/O not documented)  Plan: Continue Zosyn 3.375g IV q8h (4 hour infusion).  Empirically decrease Vanc 1250mg  IV q24h for rising Scr.  Target trough ~15 Daily SCr while on both vancomycin & zosyn Monitor renal function and cx data & pltc. Vancomycin levels as needed  Height: 5\' 4"  (162.6 cm) Weight: 99.3 kg (218 lb 14.7 oz) IBW/kg (Calculated) : 54.7  Temp (24hrs), Avg:98.2 F (36.8 C), Min:98 F (36.7 C), Max:98.5 F (36.9 C)  Recent Labs  Lab 04/21/20 1443 04/22/20 0602  WBC 21.1* 15.1*  CREATININE 0.87 1.00    Estimated Creatinine Clearance: 59.1 mL/min (by C-G formula based on SCr of 1 mg/dL).    Allergies  Allergen Reactions  . Remicade [Infliximab] Swelling and Other (See Comments)    Facial swelling, throat was closing up.     Antimicrobials this admission: 5/20 cefazolin x 1 dose 5/20 vanc>> 5/20 zosyn>>  Dose adjustments this admission: 5/21 Empirically decrease Vanc 1gm IV q12h to 1250mg  IV q24h  Microbiology results: 520 Covid: neg  Thank you for allowing pharmacy to be a part of this patient's care.  Netta Cedars, PharmD, BCPS 04/22/2020 10:32 AM

## 2020-04-22 NOTE — Anesthesia Postprocedure Evaluation (Signed)
Anesthesia Post Note  Patient: Morgan [name]  Procedure(s) Performed: INCISION AND DRAINAGE, WOUND VAC PLACEMENT ON LEFT KNEE (Bilateral Knee)     Patient location during evaluation: PACU Anesthesia Type: General Level of consciousness: awake Pain management: pain level controlled Respiratory status: spontaneous breathing Cardiovascular status: stable Postop Assessment: no apparent nausea or vomiting Anesthetic complications: no    Last Vitals:  Vitals:   04/22/20 1400 04/22/20 1415  BP: (!) 149/71 (!) 147/70  Pulse: 71 71  Resp: 15 13  Temp:    SpO2: 100% 100%    Last Pain:  Vitals:   04/22/20 1400  TempSrc:   PainSc: (P) 6                  Karletta Millay

## 2020-04-23 LAB — BASIC METABOLIC PANEL
Anion gap: 9 (ref 5–15)
BUN: 15 mg/dL (ref 8–23)
CO2: 28 mmol/L (ref 22–32)
Calcium: 9.1 mg/dL (ref 8.9–10.3)
Chloride: 101 mmol/L (ref 98–111)
Creatinine, Ser: 0.75 mg/dL (ref 0.44–1.00)
GFR calc Af Amer: >60 mL/min (ref >60)
GFR calc non Af Amer: >60 mL/min (ref >60)
Glucose, Bld: 97 mg/dL (ref 70–99)
Potassium: 3.6 mmol/L (ref 3.5–5.1)
Sodium: 138 mmol/L (ref 135–145)

## 2020-04-23 LAB — CBC
HCT: 33.3 % — ABNORMAL LOW (ref 36.0–46.0)
Hemoglobin: 10.6 g/dL — ABNORMAL LOW (ref 12.0–15.0)
MCH: 29.8 pg (ref 26.0–34.0)
MCHC: 31.8 g/dL (ref 30.0–36.0)
MCV: 93.5 fL (ref 80.0–100.0)
Platelets: 96 10*3/uL — ABNORMAL LOW (ref 150–400)
RBC: 3.56 MIL/uL — ABNORMAL LOW (ref 3.87–5.11)
RDW: 15.7 % — ABNORMAL HIGH (ref 11.5–15.5)
WBC: 24 10*3/uL — ABNORMAL HIGH (ref 4.0–10.5)
nRBC: 0 % (ref 0.0–0.2)

## 2020-04-23 LAB — MAGNESIUM: Magnesium: 1.7 mg/dL (ref 1.7–2.4)

## 2020-04-23 MED ORDER — HYDROCODONE-ACETAMINOPHEN 5-325 MG PO TABS
1.0000 | ORAL_TABLET | Freq: Four times a day (QID) | ORAL | Status: DC | PRN
  Administered 2020-04-23: 1 via ORAL
  Filled 2020-04-23: qty 1

## 2020-04-23 MED ORDER — HYDROCODONE-ACETAMINOPHEN 5-325 MG PO TABS
1.0000 | ORAL_TABLET | ORAL | Status: DC | PRN
  Administered 2020-04-23 – 2020-04-25 (×8): 2 via ORAL
  Administered 2020-04-26 – 2020-04-27 (×3): 1 via ORAL
  Administered 2020-04-27 (×2): 2 via ORAL
  Administered 2020-04-28 (×2): 1 via ORAL
  Filled 2020-04-23: qty 1
  Filled 2020-04-23: qty 2
  Filled 2020-04-23: qty 1
  Filled 2020-04-23 (×2): qty 2
  Filled 2020-04-23: qty 1
  Filled 2020-04-23 (×4): qty 2
  Filled 2020-04-23: qty 1
  Filled 2020-04-23 (×3): qty 2
  Filled 2020-04-23: qty 1

## 2020-04-23 NOTE — Progress Notes (Signed)
   Subjective:  Patient reports pain as mild.  No reported events overnight  Objective:   VITALS:   Vitals:   04/22/20 1721 04/22/20 1829 04/22/20 2114 04/23/20 0533  BP: 135/64 135/62 (!) 130/52 (!) 145/56  Pulse: 78 72 72 79  Resp: 18 18 16 17   Temp: 97.6 F (36.4 C) 98 F (36.7 C) 98.4 F (36.9 C) 98.6 F (37 C)  TempSrc: Oral Oral Oral Oral  SpO2: 98% 98% 99% 97%  Weight:      Height:         Lab Results  Component Value Date   WBC 24.0 (H) 04/23/2020   HGB 10.6 (L) 04/23/2020   HCT 33.3 (L) 04/23/2020   MCV 93.5 04/23/2020   PLT 96 (L) 04/23/2020   BMET    Component Value Date/Time   NA 138 04/23/2020 0542   NA 139 06/28/2017 0851   K 3.6 04/23/2020 0542   K 3.8 06/28/2017 0851   CL 101 04/23/2020 0542   CO2 28 04/23/2020 0542   CO2 28 06/28/2017 0851   GLUCOSE 97 04/23/2020 0542   GLUCOSE 94 06/28/2017 0851   BUN 15 04/23/2020 0542   BUN 21.4 06/28/2017 0851   CREATININE 0.75 04/23/2020 0542   CREATININE 1.00 08/26/2019 0902   CREATININE 1.0 06/28/2017 0851   CALCIUM 9.1 04/23/2020 0542   CALCIUM 10.8 (H) 06/28/2017 0851   GFRNONAA >60 04/23/2020 0542   GFRNONAA 57 (L) 08/26/2019 0902   GFRNONAA 61 11/18/2013 0813   GFRAA >60 04/23/2020 0542   GFRAA >60 08/26/2019 0902   GFRAA 71 11/18/2013 0813     Assessment/Plan: 1 Day Post-Op   Active Problems:   Mixed hyperlipidemia   Essential hypertension   Wound dehiscence  -Okay for weightbearing as tolerated to bilateral lower extremities and her knee immobilizers.  She should maintain the drain in the right knee.  -Wound VAC to the left knee.  -Plan for repeat debridement and hopeful closure of left knee tomorrow with my partner Dr. Theda Sers.  The right knee drain will be removed at that time.  -N.p.o. tonight at midnight.   Nicholes Stairs 04/23/2020, 9:58 AM   Geralynn Rile, MD [phone]

## 2020-04-23 NOTE — Progress Notes (Signed)
Morgan [name]  [id number] DOB: [birth year] DOA: 04/21/2020 PCP: [name], MD    Brief Narrative:  69 year old with a history of HTN, HLD, RA, and psoriatic arthritis who presented to the ED on referral from her orthopedist for evaluation of bilateral knee wounds.  She fell down steps a month and a half prior to this presentation and suffered bilateral knee wounds as a result.  She required stitches to the left knee at the time.  Despite 1-2 rounds of outpatient antibiotics her knee wounds have become more inflamed and irritated.  She was referred to orthopedic surgery by her primary care physician who upon seeing her in the clinic referred her to the ED to be admitted for planned bilateral knee washout procedures.  Significant Events: 5/20 admit via [facility]  Antimicrobials:  Zosyn 5/20 > Vancomycin 5/20 >   Subjective: Having difficulty with "the worst pain of my life" in the left knee.  Increase Vicodin dose at this time appears to be improving pain control.  Denies chest pain fevers or chills.  Mild nausea but no vomiting.  Assessment & Plan:  Cellulitis/bilateral knee wounds -septic prepatellar bursitis Continue empiric antibiotics -operative management per orthopedic surgery -to return to the OR tomorrow  Hypokalemia Corrected with supplementation  Normocytic anemia Likely ACD due to smoldering infection -hemoglobin relatively stable  HTN BP reasonably controlled - follow   Rheumatoid arthritis/psoriatic arthritis Treated with injectable biologic, prednisone, and Plaquenil -no evidence of an acute flare at present with meds on hold given severity of infection  HLD  DVT prophylaxis: SCDs Code Status: FULL CODE Family Communication: spoke w/ husband and daughter at bedside   Consultants:  Orthopedics   Objective: Blood pressure (!) 145/56, pulse 79, temperature 98.6 F (37 C), temperature source Oral, resp. rate 17, height 5\' 4"  (1.626 m), weight 99.3  kg, SpO2 97 %.  Intake/Output Summary (Last 24 hours) at 04/23/2020 0808 Last data filed at 04/23/2020 0500 Gross per 24 hour  Intake 2763.44 ml  Output 550 ml  Net 2213.44 ml   Filed Weights   04/22/20 0543 04/22/20 1005  Weight: 99.3 kg 99.3 kg    Examination: General: No acute respiratory distress Lungs: CTA B Cardiovascular: RRR Abdomen: Nontender, nondistended, soft, bowel sounds positive, no rebound, no ascites, no appreciable mass Extremities: trace B LE edema -knee immobilizer is in place bilateral  CBC: Recent Labs  Lab 04/21/20 1443 04/22/20 0602 04/23/20 0542  WBC 21.1* 15.1* 24.0*  NEUTROABS 12.2*  --   --   HGB 11.4* 11.1* 10.6*  HCT 35.7* 35.2* 33.3*  MCV 93.9 94.9 93.5  PLT 111* 97* 96*   Basic Metabolic Panel: Recent Labs  Lab 04/21/20 1443 04/22/20 0602 04/23/20 0542  NA 143 142 138  K 3.8 3.3* 3.6  CL 105 105 101  CO2 26 26 28   GLUCOSE 120* 66* 97  BUN 24* 20 15  CREATININE 0.87 1.00 0.75  CALCIUM 9.6 9.6 9.1  MG 1.7  --  1.7   GFR: Estimated Creatinine Clearance: 73.8 mL/min (by C-G formula based on SCr of 0.75 mg/dL).  Liver Function Tests: Recent Labs  Lab 04/21/20 1443 04/22/20 0602  AST 31 30  ALT 22 22  ALKPHOS 53 39  BILITOT 0.7 0.8  PROT 7.3 6.6  ALBUMIN 4.2 3.9    Coagulation Profile: Recent Labs  Lab 04/21/20 1443  INR 1.1    HbA1C: Hgb A1c MFr Bld  Date/Time Value Ref Range Status  12/11/2017  07:10 AM 5.4 4.8 - 5.6 % Final    Comment:    (NOTE) Pre diabetes:          5.7%-6.4% Diabetes:              >6.4% Glycemic control for   <7.0% adults with diabetes      Recent Results (from the past 240 hour(s))  SARS Coronavirus 2 by RT PCR (hospital order, performed in [facility] hospital lab) Nasopharyngeal Nasopharyngeal Swab     Status: None   Collection Time: 04/21/20  2:43 PM   Specimen: Nasopharyngeal Swab  Result Value Ref Range Status   SARS Coronavirus 2 NEGATIVE NEGATIVE Final    Comment:  (NOTE) SARS-CoV-2 target nucleic acids are NOT DETECTED. The SARS-CoV-2 RNA is generally detectable in upper and lower respiratory specimens during the acute phase of infection. The lowest concentration of SARS-CoV-2 viral copies this assay can detect is 250 copies / mL. A negative result does not preclude SARS-CoV-2 infection and should not be used as the sole basis for treatment or other patient management decisions.  A negative result may occur with improper specimen collection / handling, submission of specimen other than nasopharyngeal swab, presence of viral mutation(s) within the areas targeted by this assay, and inadequate number of viral copies (<250 copies / mL). A negative result must be combined with clinical observations, patient history, and epidemiological information. Fact Sheet for Patients:   StrictlyIdeas.no Fact Sheet for Healthcare Providers: BankingDealers.co.za This test is not yet approved or cleared  by the [place] FDA and has been authorized for detection and/or diagnosis of SARS-CoV-2 by FDA under an Emergency Use Authorization (EUA).  This EUA will remain in effect (meaning this test can be used) for the duration of the COVID-19 declaration under Section 564(b)(1) of the Act, 21 U.S.C. section 360bbb-3(b)(1), unless the authorization is terminated or revoked sooner. Performed at [facility], [place] [address], [place], [place] [address]   Surgical PCR screen     Status: Abnormal   Collection Time: 04/22/20  5:35 AM   Specimen: Nasal Mucosa; Nasal Swab  Result Value Ref Range Status   MRSA, PCR NEGATIVE NEGATIVE Final   Staphylococcus aureus POSITIVE (A) NEGATIVE Final    Comment: (NOTE) The Xpert SA Assay (FDA approved for NASAL specimens in patients 55 years of age and older), is one component of a comprehensive surveillance program. It is not intended to diagnose infection nor  to guide or monitor treatment. Performed at [facility], [place] [address] [place], [place] [address]   Aerobic/Anaerobic Culture (surgical/deep wound)     Status: None (Preliminary result)   Collection Time: 04/22/20 12:40 PM   Specimen: Soft Tissue, Other  Result Value Ref Range Status   Specimen Description   Final    KNEE LEFT FLUID Performed at [place] [address] [place], [place] [address]    Special Requests   Final    NONE Performed at [facility], [place] [address] [place], [place] [address]    Gram Stain   Final    MODERATE WBC PRESENT,BOTH PMN AND MONONUCLEAR FEW GRAM POSITIVE COCCI Performed at [facility], [place] [address] [place], [place] [address]    Culture PENDING  Incomplete   Report Status PENDING  Incomplete  Aerobic/Anaerobic Culture (surgical/deep wound)     Status: None (Preliminary result)   Collection Time: 04/22/20 12:40 PM   Specimen: Wound  Result Value Ref  Range Status   Specimen Description   Final    KNEE RIGHT FLUID Performed at [place] [address] [place], [place] [address]    Special Requests   Final    NONE Performed at [facility], [place] [address], [place], [place] [address]    Gram Stain   Final    ABUNDANT WBC PRESENT,BOTH PMN AND MONONUCLEAR NO ORGANISMS SEEN Performed at [facility], [place] [address], [place], [place] [address]    Culture PENDING  Incomplete   Report Status PENDING  Incomplete  Aerobic/Anaerobic Culture (surgical/deep wound)     Status: None (Preliminary result)   Collection Time: 04/22/20 12:40 PM   Specimen: Soft Tissue, Other  Result Value Ref Range Status   Specimen Description   Final    KNEE RIGHT TISSUE Performed at [facility] [address], [place], [place] [address]    Special Requests   Final    NONE Performed at [facility], [place]  [address] [place], [place] [address]    Gram Stain   Final    FEW WBC PRESENT,BOTH PMN AND MONONUCLEAR FEW GRAM POSITIVE COCCI Performed at [facility], [place] [address] [place], [place] [address]    Culture PENDING  Incomplete   Report Status PENDING  Incomplete  Aerobic/Anaerobic Culture (surgical/deep wound)     Status: None (Preliminary result)   Collection Time: 04/22/20 12:40 PM   Specimen: Wound  Result Value Ref Range Status   Specimen Description   Final    KNEE LEFT TISSUE Performed at [place] [address] [place], [place] [address]    Special Requests   Final    NONE Performed at [facility], [place] [address] [place], [place] [address]    Gram Stain   Final    ABUNDANT WBC PRESENT,BOTH PMN AND MONONUCLEAR FEW GRAM POSITIVE COCCI Performed at [facility], [place] [address], [place], [place] [address]    Culture PENDING  Incomplete   Report Status PENDING  Incomplete     Scheduled Meds: . [START ON 04/25/2020] aspirin EC  81 mg Oral Once per day on Mon Wed Fri  . calcium-vitamin D  1 tablet Oral BID  . cholecalciferol  2,000 Units Oral Daily  . docusate sodium  100 mg Oral BID  . doxepin  10 mg Oral BID  . enoxaparin (LOVENOX) injection  40 mg Subcutaneous Q24H  . fenofibrate  160 mg Oral QHS  . multivitamin with minerals  1 tablet Oral Daily  . mupirocin ointment  1 application Nasal BID  . predniSONE  5 mg Oral Daily  . simvastatin  40 mg Oral QPM   Continuous Infusions: . sodium chloride 75 mL/hr at 04/22/20 1733  . piperacillin-tazobactam (ZOSYN)  IV 3.375 g (04/23/20 0153)  . vancomycin 1,250 mg (04/22/20 2201)     LOS: 2 days   Cherene Altes, MD [facility] Office  [phone] Pager - Text Page per Amion  If 7PM-7AM, please contact night-coverage per Amion 04/23/2020, 8:08 AM

## 2020-04-24 ENCOUNTER — Encounter (HOSPITAL_COMMUNITY)
Admission: EM | Disposition: A | Discharge status: Home Health | Payer: Self-pay | Source: Ambulatory Visit | Attending: Internal Medicine

## 2020-04-24 ENCOUNTER — Inpatient Hospital Stay (HOSPITAL_COMMUNITY): Payer: BC Managed Care – PPO | Admitting: Certified Registered Nurse Anesthetist

## 2020-04-24 DIAGNOSIS — S81002A Unspecified open wound, left knee, initial encounter: Secondary | ICD-10-CM | POA: Diagnosis not present

## 2020-04-24 DIAGNOSIS — M7042 Prepatellar bursitis, left knee: Secondary | ICD-10-CM | POA: Diagnosis not present

## 2020-04-24 DIAGNOSIS — I10 Essential (primary) hypertension: Secondary | ICD-10-CM | POA: Diagnosis not present

## 2020-04-24 DIAGNOSIS — E782 Mixed hyperlipidemia: Secondary | ICD-10-CM | POA: Diagnosis not present

## 2020-04-24 HISTORY — PX: DRESSING CHANGE UNDER ANESTHESIA: SHX5237

## 2020-04-24 HISTORY — PX: INCISION AND DRAINAGE ABSCESS: SHX5864

## 2020-04-24 LAB — CBC
HCT: 32.8 % — ABNORMAL LOW (ref 36.0–46.0)
Hemoglobin: 10.3 g/dL — ABNORMAL LOW (ref 12.0–15.0)
MCH: 30.2 pg (ref 26.0–34.0)
MCHC: 31.4 g/dL (ref 30.0–36.0)
MCV: 96.2 fL (ref 80.0–100.0)
Platelets: 88 10*3/uL — ABNORMAL LOW (ref 150–400)
RBC: 3.41 MIL/uL — ABNORMAL LOW (ref 3.87–5.11)
RDW: 15.9 % — ABNORMAL HIGH (ref 11.5–15.5)
WBC: 22 10*3/uL — ABNORMAL HIGH (ref 4.0–10.5)
nRBC: 0 % (ref 0.0–0.2)

## 2020-04-24 LAB — ACID FAST SMEAR (AFB, MYCOBACTERIA)
Acid Fast Smear: NEGATIVE
Acid Fast Smear: NEGATIVE

## 2020-04-24 LAB — BASIC METABOLIC PANEL
Anion gap: 9 (ref 5–15)
BUN: 12 mg/dL (ref 8–23)
CO2: 28 mmol/L (ref 22–32)
Calcium: 9 mg/dL (ref 8.9–10.3)
Chloride: 104 mmol/L (ref 98–111)
Creatinine, Ser: 0.8 mg/dL (ref 0.44–1.00)
GFR calc Af Amer: >60 mL/min (ref >60)
GFR calc non Af Amer: >60 mL/min (ref >60)
Glucose, Bld: 95 mg/dL (ref 70–99)
Potassium: 3.8 mmol/L (ref 3.5–5.1)
Sodium: 141 mmol/L (ref 135–145)

## 2020-04-24 SURGERY — INCISION AND DRAINAGE, ABSCESS
Anesthesia: General | Site: Knee | Laterality: Right

## 2020-04-24 MED ORDER — FENTANYL CITRATE (PF) 100 MCG/2ML IJ SOLN
INTRAMUSCULAR | Status: AC
  Filled 2020-04-24: qty 2

## 2020-04-24 MED ORDER — 0.9 % SODIUM CHLORIDE (POUR BTL) OPTIME
TOPICAL | Status: DC | PRN
  Administered 2020-04-24: 1000 mL

## 2020-04-24 MED ORDER — PHENYLEPHRINE 40 MCG/ML (10ML) SYRINGE FOR IV PUSH (FOR BLOOD PRESSURE SUPPORT)
PREFILLED_SYRINGE | INTRAVENOUS | Status: DC | PRN
  Administered 2020-04-24 (×2): 80 ug via INTRAVENOUS

## 2020-04-24 MED ORDER — ONDANSETRON HCL 4 MG/2ML IJ SOLN
INTRAMUSCULAR | Status: AC
  Filled 2020-04-24: qty 2

## 2020-04-24 MED ORDER — MIDAZOLAM HCL 2 MG/2ML IJ SOLN
INTRAMUSCULAR | Status: AC
  Filled 2020-04-24: qty 2

## 2020-04-24 MED ORDER — SODIUM CHLORIDE 0.9 % IR SOLN
Status: DC | PRN
  Administered 2020-04-24: 4000 mL

## 2020-04-24 MED ORDER — LIDOCAINE 2% (20 MG/ML) 5 ML SYRINGE
INTRAMUSCULAR | Status: DC | PRN
  Administered 2020-04-24: 80 mg via INTRAVENOUS

## 2020-04-24 MED ORDER — PHENYLEPHRINE 40 MCG/ML (10ML) SYRINGE FOR IV PUSH (FOR BLOOD PRESSURE SUPPORT)
PREFILLED_SYRINGE | INTRAVENOUS | Status: AC
  Filled 2020-04-24: qty 10

## 2020-04-24 MED ORDER — LIDOCAINE 2% (20 MG/ML) 5 ML SYRINGE
INTRAMUSCULAR | Status: AC
  Filled 2020-04-24: qty 5

## 2020-04-24 MED ORDER — EPHEDRINE SULFATE-NACL 50-0.9 MG/10ML-% IV SOSY
PREFILLED_SYRINGE | INTRAVENOUS | Status: DC | PRN
  Administered 2020-04-24: 5 mg via INTRAVENOUS

## 2020-04-24 MED ORDER — LACTATED RINGERS IV SOLN: INTRAVENOUS | Status: DC | PRN

## 2020-04-24 MED ORDER — PROPOFOL 10 MG/ML IV BOLUS
INTRAVENOUS | Status: DC | PRN
  Administered 2020-04-24: 170 mg via INTRAVENOUS

## 2020-04-24 MED ORDER — FENTANYL CITRATE (PF) 100 MCG/2ML IJ SOLN
INTRAMUSCULAR | Status: DC | PRN
  Administered 2020-04-24 (×2): 25 ug via INTRAVENOUS
  Administered 2020-04-24 (×5): 50 ug via INTRAVENOUS

## 2020-04-24 MED ORDER — PROPOFOL 10 MG/ML IV BOLUS
INTRAVENOUS | Status: AC
  Filled 2020-04-24: qty 20

## 2020-04-24 MED ORDER — MIDAZOLAM HCL 2 MG/2ML IJ SOLN
INTRAMUSCULAR | Status: DC | PRN
  Administered 2020-04-24: 2 mg via INTRAVENOUS

## 2020-04-24 MED ORDER — FENTANYL CITRATE (PF) 100 MCG/2ML IJ SOLN
25.0000 ug | INTRAMUSCULAR | Status: DC | PRN
  Administered 2020-04-24: 50 ug via INTRAVENOUS

## 2020-04-24 MED ORDER — DEXAMETHASONE SODIUM PHOSPHATE 10 MG/ML IJ SOLN
INTRAMUSCULAR | Status: AC
  Filled 2020-04-24: qty 1

## 2020-04-24 MED ORDER — EPHEDRINE 5 MG/ML INJ
INTRAVENOUS | Status: AC
  Filled 2020-04-24: qty 10

## 2020-04-24 MED ORDER — DEXAMETHASONE SODIUM PHOSPHATE 4 MG/ML IJ SOLN
INTRAMUSCULAR | Status: DC | PRN
  Administered 2020-04-24: 5 mg via INTRAVENOUS

## 2020-04-24 MED ORDER — FENTANYL CITRATE (PF) 100 MCG/2ML IJ SOLN: 25.0000 ug | INTRAMUSCULAR | Status: DC | PRN

## 2020-04-24 MED ORDER — CEFAZOLIN SODIUM-DEXTROSE 2-4 GM/100ML-% IV SOLN
2.0000 g | Freq: Three times a day (TID) | INTRAVENOUS | Status: DC
  Administered 2020-04-25 – 2020-04-26 (×4): 2 g via INTRAVENOUS
  Filled 2020-04-24 (×5): qty 100

## 2020-04-24 MED ORDER — ONDANSETRON HCL 4 MG/2ML IJ SOLN
INTRAMUSCULAR | Status: DC | PRN
  Administered 2020-04-24: 4 mg via INTRAVENOUS

## 2020-04-24 SURGICAL SUPPLY — 46 items
BAG ZIPLOCK 12X15 (MISCELLANEOUS) ×3 IMPLANT
BANDAGE ESMARK 6X9 LF (GAUZE/BANDAGES/DRESSINGS) ×2 IMPLANT
BINDER ABDOMINAL 12 ML 46-62 (SOFTGOODS) ×2 IMPLANT
BLADE SAW SGTL 11.0X1.19X90.0M (BLADE) IMPLANT
BNDG ELASTIC 6X10 VLCR STRL LF (GAUZE/BANDAGES/DRESSINGS) ×2 IMPLANT
BNDG ESMARK 6X9 LF (GAUZE/BANDAGES/DRESSINGS) ×3
BNDG GAUZE ELAST 4 BULKY (GAUZE/BANDAGES/DRESSINGS) ×2 IMPLANT
COVER SURGICAL LIGHT HANDLE (MISCELLANEOUS) ×3 IMPLANT
COVER WAND RF STERILE (DRAPES) IMPLANT
CUFF TOURN SGL QUICK 18X4 (TOURNIQUET CUFF) ×2 IMPLANT
CUFF TOURN SGL QUICK 24 (TOURNIQUET CUFF) ×3
CUFF TOURN SGL QUICK 34 (TOURNIQUET CUFF) ×3
CUFF TRNQT CYL 24X4X16.5-23 (TOURNIQUET CUFF) ×2 IMPLANT
CUFF TRNQT CYL 34X4.125X (TOURNIQUET CUFF) ×2 IMPLANT
DRAIN PENROSE 0.5X18 (DRAIN) ×4 IMPLANT
DRSG PAD ABDOMINAL 8X10 ST (GAUZE/BANDAGES/DRESSINGS) ×3 IMPLANT
DURAPREP 26ML APPLICATOR (WOUND CARE) ×3 IMPLANT
ELECT REM PT RETURN 15FT ADLT (MISCELLANEOUS) ×3 IMPLANT
GAUZE SPONGE 4X4 12PLY STRL (GAUZE/BANDAGES/DRESSINGS) ×4 IMPLANT
GAUZE XEROFORM 1X8 LF (GAUZE/BANDAGES/DRESSINGS) ×2 IMPLANT
GLOVE BIOGEL M 7.0 STRL (GLOVE) IMPLANT
GLOVE BIOGEL PI IND STRL 7.5 (GLOVE) ×2 IMPLANT
GLOVE BIOGEL PI IND STRL 8.5 (GLOVE) ×2 IMPLANT
GLOVE BIOGEL PI INDICATOR 7.5 (GLOVE) ×1
GLOVE BIOGEL PI INDICATOR 8.5 (GLOVE) ×1
GLOVE ECLIPSE 8.0 STRL XLNG CF (GLOVE) IMPLANT
GLOVE ORTHO TXT STRL SZ7.5 (GLOVE) ×6 IMPLANT
GLOVE SURG ORTHO 8.0 STRL STRW (GLOVE) ×3 IMPLANT
GOWN STRL REUS W/TWL LRG LVL3 (GOWN DISPOSABLE) ×3 IMPLANT
GOWN STRL REUS W/TWL XL LVL3 (GOWN DISPOSABLE) ×6 IMPLANT
HANDPIECE INTERPULSE COAX TIP (DISPOSABLE) ×3
IMMOBILIZER KNEE 20 (SOFTGOODS) ×6 IMPLANT
IMMOBILIZER KNEE 20 THIGH 36 (SOFTGOODS) IMPLANT
KIT BASIN (CUSTOM PROCEDURE TRAY) ×3 IMPLANT
KIT TURNOVER KIT A (KITS) IMPLANT
MANIFOLD NEPTUNE II (INSTRUMENTS) ×3 IMPLANT
PACK ORTHO EXTREMITY (CUSTOM PROCEDURE TRAY) ×3 IMPLANT
PAD CAST 4YDX4 CTTN HI CHSV (CAST SUPPLIES) ×2 IMPLANT
PADDING CAST COTTON 4X4 STRL (CAST SUPPLIES)
PENCIL SMOKE EVACUATOR (MISCELLANEOUS) IMPLANT
PROTECTOR NERVE ULNAR (MISCELLANEOUS) ×3 IMPLANT
SET HNDPC FAN SPRY TIP SCT (DISPOSABLE) ×2 IMPLANT
SOL PREP PROV IODINE SCRUB 4OZ (MISCELLANEOUS) ×2 IMPLANT
SUT ETHILON 2 0 PS N (SUTURE) ×4 IMPLANT
SYR CONTROL 10ML LL (SYRINGE) ×2 IMPLANT
TOWEL OR 17X26 10 PK STRL BLUE (TOWEL DISPOSABLE) ×5 IMPLANT

## 2020-04-24 NOTE — Anesthesia Procedure Notes (Signed)
Procedure Name: LMA Insertion Date/Time: 04/24/2020 7:51 AM Performed by: Claudia Desanctis, CRNA Pre-anesthesia Checklist: Emergency Drugs available, Patient identified, Suction available and Patient being monitored Patient Re-evaluated:Patient Re-evaluated prior to induction Oxygen Delivery Method: Circle system utilized Preoxygenation: Pre-oxygenation with 100% oxygen Induction Type: IV induction Ventilation: Mask ventilation without difficulty LMA: LMA with gastric port inserted LMA Size: 4.0 Number of attempts: 1 Placement Confirmation: positive ETCO2 and breath sounds checked- equal and bilateral Tube secured with: Tape Dental Injury: Teeth and Oropharynx as per pre-operative assessment

## 2020-04-24 NOTE — Progress Notes (Signed)
Morgan [name]  [id number] DOB: 15-Dec-1950 DOA: 04/21/2020 PCP: [name], MD    Brief Narrative:  69 year old with a history of HTN, HLD, RA, and psoriatic arthritis who presented to the ED on referral from her orthopedist for evaluation of bilateral knee wounds.  She fell down steps a month and a half prior to this presentation and suffered bilateral knee wounds as a result.  She required stitches to the left knee at the time.  Despite 1-2 rounds of outpatient antibiotics her knee wounds have become more inflamed and irritated.  She was referred to orthopedic surgery by her primary care physician who upon seeing her in the clinic referred her to the ED to be admitted for planned bilateral knee washout procedures.  Significant Events: 5/20 admit [facility] 5/23 left knee I and D and wound closure   Antimicrobials:  Zosyn 5/20 > Vancomycin 5/20 >   Subjective: States she tolerated the second surgery very well.  Reports pain is well controlled.  Has no new complaints at this time.  Assessment & Plan:  Cellulitis/bilateral knee wounds -septic prepatellar bursitis - Staph aureus  operative management per orthopedic surgery -plan to narrow antibiotic spectrum tomorrow based upon culture data  Hypokalemia Corrected with supplementation  Normocytic anemia Likely ACD due to smoldering infection - hemoglobin relatively stable  HTN BP controlled - follow   Rheumatoid arthritis/psoriatic arthritis Treated with injectable biologic, prednisone, and Plaquenil - no evidence of an acute flare at present - meds on hold given severity of infection  HLD  DVT prophylaxis: SCDs Code Status: FULL CODE Family Communication: Spoke with patient and husband at bedside  Consultants:  Orthopedics   Objective: Blood pressure (!) 120/47, pulse 75, temperature 98.2 F (36.8 C), temperature source Oral, resp. rate 19, height 5\' 4"  (1.626 m), weight 100.2 kg, SpO2 93  %.  Intake/Output Summary (Last 24 hours) at 04/24/2020 0810 Last data filed at 04/24/2020 0647 Gross per 24 hour  Intake 472 ml  Output 2800 ml  Net -2328 ml   Filed Weights   04/22/20 0543 04/22/20 1005 04/24/20 0511  Weight: 99.3 kg 99.3 kg 100.2 kg    Examination: General: No acute respiratory distress Lungs: CTA B without wheezing Cardiovascular: RRR Abdomen: Soft, BS positive, nontender Extremities: trace B LE edema  CBC: Recent Labs  Lab 04/21/20 1443 04/21/20 1443 04/22/20 0602 04/23/20 0542 04/24/20 0600  WBC 21.1*   < > 15.1* 24.0* 22.0*  NEUTROABS 12.2*  --   --   --   --   HGB 11.4*   < > 11.1* 10.6* 10.3*  HCT 35.7*   < > 35.2* 33.3* 32.8*  MCV 93.9   < > 94.9 93.5 96.2  PLT 111*   < > 97* 96* 88*   < > = values in this interval not displayed.   Basic Metabolic Panel: Recent Labs  Lab 04/21/20 1443 04/21/20 1443 04/22/20 0602 04/23/20 0542 04/24/20 0600  NA 143   < > 142 138 141  K 3.8   < > 3.3* 3.6 3.8  CL 105   < > 105 101 104  CO2 26   < > 26 28 28   GLUCOSE 120*   < > 66* 97 95  BUN 24*   < > 20 15 12   CREATININE 0.87   < > 1.00 0.75 0.80  CALCIUM 9.6   < > 9.6 9.1 9.0  MG 1.7  --   --  1.7  --    < > =  values in this interval not displayed.   GFR: Estimated Creatinine Clearance: 74.2 mL/min (by C-G formula based on SCr of 0.8 mg/dL).  Liver Function Tests: Recent Labs  Lab 04/21/20 1443 04/22/20 0602  AST 31 30  ALT 22 22  ALKPHOS 53 39  BILITOT 0.7 0.8  PROT 7.3 6.6  ALBUMIN 4.2 3.9    Coagulation Profile: Recent Labs  Lab 04/21/20 1443  INR 1.1    HbA1C: Hgb A1c MFr Bld  Date/Time Value Ref Range Status  12/11/2017 07:10 AM 5.4 4.8 - 5.6 % Final    Comment:    (NOTE) Pre diabetes:          5.7%-6.4% Diabetes:              >6.4% Glycemic control for   <7.0% adults with diabetes      Recent Results (from the past 240 hour(s))  SARS Coronavirus 2 by RT PCR (hospital order, performed in [facility] hospital  lab) Nasopharyngeal Nasopharyngeal Swab     Status: None   Collection Time: 04/21/20  2:43 PM   Specimen: Nasopharyngeal Swab  Result Value Ref Range Status   SARS Coronavirus 2 NEGATIVE NEGATIVE Final    Comment: (NOTE) SARS-CoV-2 target nucleic acids are NOT DETECTED. The SARS-CoV-2 RNA is generally detectable in upper and lower respiratory specimens during the acute phase of infection. The lowest concentration of SARS-CoV-2 viral copies this assay can detect is 250 copies / mL. A negative result does not preclude SARS-CoV-2 infection and should not be used as the sole basis for treatment or other patient management decisions.  A negative result may occur with improper specimen collection / handling, submission of specimen other than nasopharyngeal swab, presence of viral mutation(s) within the areas targeted by this assay, and inadequate number of viral copies (<250 copies / mL). A negative result must be combined with clinical observations, patient history, and epidemiological information. Fact Sheet for Patients:   StrictlyIdeas.no Fact Sheet for Healthcare Providers: BankingDealers.co.za This test is not yet approved or cleared  by the [place] FDA and has been authorized for detection and/or diagnosis of SARS-CoV-2 by FDA under an Emergency Use Authorization (EUA).  This EUA will remain in effect (meaning this test can be used) for the duration of the COVID-19 declaration under Section 564(b)(1) of the Act, 21 U.S.C. section 360bbb-3(b)(1), unless the authorization is terminated or revoked sooner. Performed at [facility], [place] [address] [place], [place] [address]   Surgical PCR screen     Status: Abnormal   Collection Time: 04/22/20  5:35 AM   Specimen: Nasal Mucosa; Nasal Swab  Result Value Ref Range Status   MRSA, PCR NEGATIVE NEGATIVE Final   Staphylococcus aureus POSITIVE (A) NEGATIVE Final     Comment: (NOTE) The Xpert SA Assay (FDA approved for NASAL specimens in patients 50 years of age and older), is one component of a comprehensive surveillance program. It is not intended to diagnose infection nor to guide or monitor treatment. Performed at [facility], [place] [address] [place], [place] [address]   Aerobic/Anaerobic Culture (surgical/deep wound)     Status: None (Preliminary result)   Collection Time: 04/22/20 12:40 PM   Specimen: Soft Tissue, Other  Result Value Ref Range Status   Specimen Description   Final    KNEE LEFT FLUID Performed at [facility] [address], [place], [place] [address]    Special Requests   Final    NONE  Performed at [facility], [place] [address], [place], [place] [address]    Gram Stain   Final    MODERATE WBC PRESENT,BOTH PMN AND MONONUCLEAR FEW GRAM POSITIVE COCCI Performed at [facility], [place] [address] [place], [place] [address]    Culture FEW STAPHYLOCOCCUS AUREUS  Final   Report Status PENDING  Incomplete  Aerobic/Anaerobic Culture (surgical/deep wound)     Status: None (Preliminary result)   Collection Time: 04/22/20 12:40 PM   Specimen: Wound  Result Value Ref Range Status   Specimen Description   Final    KNEE RIGHT FLUID Performed at [facility] [address], [place], [place] [address]    Special Requests   Final    NONE Performed at [facility], [place] [address] [place], [place] [address]    Gram Stain   Final    ABUNDANT WBC PRESENT,BOTH PMN AND MONONUCLEAR NO ORGANISMS SEEN Performed at [facility], [place] [address], [place], [place] [address]    Culture FEW STAPHYLOCOCCUS AUREUS  Final   Report Status PENDING  Incomplete  Aerobic/Anaerobic Culture (surgical/deep wound)     Status: None (Preliminary result)   Collection Time: 04/22/20 12:40 PM   Specimen: Soft Tissue, Other  Result Value Ref Range  Status   Specimen Description   Final    KNEE RIGHT TISSUE Performed at [facility] [address] [place], [place] [address]    Special Requests   Final    NONE Performed at [facility], [place] [address], [place], [place] [address]    Gram Stain   Final    FEW WBC PRESENT,BOTH PMN AND MONONUCLEAR FEW GRAM POSITIVE COCCI Performed at [facility], [place] [address], [place], [place] [address]    Culture ABUNDANT STAPHYLOCOCCUS AUREUS  Final   Report Status PENDING  Incomplete  Aerobic/Anaerobic Culture (surgical/deep wound)     Status: None (Preliminary result)   Collection Time: 04/22/20 12:40 PM   Specimen: Wound  Result Value Ref Range Status   Specimen Description   Final    KNEE LEFT TISSUE Performed at [facility] [address], [place], [place] [address]    Special Requests   Final    NONE Performed at [facility], [place] [address] [place], [place] [address]    Gram Stain   Final    ABUNDANT WBC PRESENT,BOTH PMN AND MONONUCLEAR FEW GRAM POSITIVE COCCI Performed at [facility], [place] [address], [place], [place] [address]    Culture FEW STAPHYLOCOCCUS AUREUS  Final   Report Status PENDING  Incomplete  Acid Fast Smear (AFB)     Status: None   Collection Time: 04/22/20 12:40 PM   Specimen: Soft Tissue, Other  Result Value Ref Range Status   AFB Specimen Processing Comment  Final    Comment: Tissue Grinding and Digestion/Decontamination   Acid Fast Smear Negative  Final    Comment: (NOTE) Performed At: [facility] [place], [place] [id number] Rush Farmer MD [id number]    Source (AFB) RIGHT  Final    Comment: KNEE TISSUE Performed at [facility], [place] [address] [place], [place] [address]   Acid Fast Smear (AFB)     Status: None   Collection Time: 04/22/20 12:40 PM   Specimen: Wound  Result Value Ref Range Status   AFB  Specimen Processing Comment  Final  Comment: Tissue Grinding and Digestion/Decontamination   Acid Fast Smear Negative  Final    Comment: (NOTE) Performed At: [facility] [place], [place] [id number] Rush Farmer MD [id number]    Source (AFB) LEFT  Final    Comment: KNEE TISSUE Performed at [facility], [place] [address], [place], [place] [address]      Scheduled Meds: . [MAR Hold] aspirin EC  81 mg Oral Once per day on Mon Wed Fri  . [MAR Hold] calcium-vitamin D  1 tablet Oral BID  . [MAR Hold] cholecalciferol  2,000 Units Oral Daily  . [MAR Hold] docusate sodium  100 mg Oral BID  . [MAR Hold] doxepin  10 mg Oral BID  . [MAR Hold] enoxaparin (LOVENOX) injection  40 mg Subcutaneous Q24H  . [MAR Hold] fenofibrate  160 mg Oral QHS  . [MAR Hold] multivitamin with minerals  1 tablet Oral Daily  . [MAR Hold] mupirocin ointment  1 application Nasal BID  . [MAR Hold] predniSONE  5 mg Oral Daily  . [MAR Hold] simvastatin  40 mg Oral QPM   Continuous Infusions: . sodium chloride 75 mL/hr at 04/24/20 0303  . [MAR Hold] piperacillin-tazobactam (ZOSYN)  IV 3.375 g (04/24/20 0302)  . [MAR Hold] vancomycin 1,250 mg (04/23/20 2149)     LOS: 3 days   Cherene Altes, MD [facility] Office  [phone] Pager - Text Page per Amion  If 7PM-7AM, please contact night-coverage per Amion 04/24/2020, 8:10 AM

## 2020-04-24 NOTE — Op Note (Signed)
Preop diagnosis #1 left t knee second look irrigation debridement of open wound 2.  Right left knee knee dressing change advancements a Penrose drain Postop diagnosis 1 same 2 same Procedure #1 left knee irrigation debridement of open wound, excision of necrotic skin, wound closure over Penrose drain, 25 cm in length. #2 right knee dressing change advancement of Penrose drain Surgeon Hart Robinsons, MD Anesthesia General Estimated blood loss 50 cc Drains 1 Penrose right knee, 1 Penrose left knee, Complications none Cultures none Disposition PACU stable  Operative details patient was counseled in the holding area correct side identified marked signed appropriately IV antibiotics have been given and were on schedule.  Taken to the OR placed under general anesthesia.  Right lower extremity first examined dressing removed wound found to be healing without evidence of active infection serosanguineous primarily postop hematoma was expressed drain was advanced no purulence encountered redressed.  Timeout was done again for the left side dressing was removed sutures were removed sponge 1 wound VAC was removed no evidence of pus or active infection Giardia wound looked healthy central area had 2 cm of necrosis proximal and distal in the midportion.  Elevated prepped with Betadine draped in sterile fashion.  2 cm of necrotic skin was in the midportion proximal and distal this was sharply excised with a 15 blade but back to bleeding edges skin flaps were elevated copiously irrigated with pulsatile lavage then irrigated with irrisept.  Beginning lateral going medial followed by medial to lateral wound edges were approximated with mattress sutures loosely being very meticulous with skin edges in the central portion wound edges were advanced appropriately to be without tension and closed with 2-0 and 4-0 nylon.  Penrose drain was placed brought out the most dependent area.  Sterile dressing applied.  Sponge and  needle count correct no complications or problems disposition to PACU stable.  Discussed with husband postoperatively we will continue IV antibiotics until culture and sensitivities are complete.

## 2020-04-24 NOTE — Anesthesia Postprocedure Evaluation (Signed)
Anesthesia Post Note  Patient: Morgan [name]  Procedure(s) Performed: INCISION AND DRAINAGE LEFT KNEE (Left Knee) DRESSING CHANGE UNDER ANESTHESIA right knee (Right Knee)     Patient location during evaluation: PACU Anesthesia Type: General Level of consciousness: awake Pain management: pain level controlled Vital Signs Assessment: post-procedure vital signs reviewed and stable Respiratory status: spontaneous breathing Cardiovascular status: stable Postop Assessment: no apparent nausea or vomiting Anesthetic complications: no    Last Vitals:  Vitals:   04/24/20 0511 04/24/20 0915  BP: (!) 120/47 (!) 119/56  Pulse: 75 84  Resp: 19 13  Temp: 36.8 C 36.6 C  SpO2: 93% 100%    Last Pain:  Vitals:   04/24/20 0945  TempSrc:   PainSc: 4     LLE Motor Response: Responds to commands (04/24/20 0945) LLE Sensation: No numbness;No tingling (04/24/20 0945) RLE Motor Response: Responds to commands (04/24/20 0945) RLE Sensation: No numbness;No tingling (04/24/20 0945)      Santiago Graf

## 2020-04-24 NOTE — Anesthesia Preprocedure Evaluation (Signed)
Anesthesia Evaluation  Patient identified by MRN, date of birth, ID band Patient awake    Reviewed: Allergy & Precautions, NPO status , Patient's Chart, lab work & pertinent test results  History of Anesthesia Complications (+) PONV  Airway Mallampati: II  TM Distance: >3 FB     Dental   Pulmonary pneumonia, former smoker,    breath sounds clear to auscultation       Cardiovascular hypertension,  Rhythm:Regular Rate:Normal     Neuro/Psych    GI/Hepatic Neg liver ROS, GERD  ,  Endo/Other  negative endocrine ROS  Renal/GU negative Renal ROS     Musculoskeletal   Abdominal   Peds  Hematology negative hematology ROS (+)   Anesthesia Other Findings   Reproductive/Obstetrics                             Anesthesia Physical Anesthesia Plan  ASA: III  Anesthesia Plan: General   Post-op Pain Management:    Induction: Intravenous  PONV Risk Score and Plan: 0, 1, 2 and 3 and Ondansetron, Dexamethasone and Midazolam  Airway Management Planned: LMA  Additional Equipment:   Intra-op Plan:   Post-operative Plan: Extubation in OR  Informed Consent: I have reviewed the patients History and Physical, chart, labs and discussed the procedure including the risks, benefits and alternatives for the proposed anesthesia with the patient or authorized representative who has indicated his/her understanding and acceptance.     Dental advisory given  Plan Discussed with: CRNA and Anesthesiologist  Anesthesia Plan Comments:         Anesthesia Quick Evaluation

## 2020-04-24 NOTE — Progress Notes (Signed)
Subjective: Patient had a left knee I and D and wound closure this am. See OP note for details of surgery   Objective: Vital signs in last 24 hours: Temp:  [97.9 F (36.6 C)-99.4 F (37.4 C)] 97.9 F (36.6 C) (05/23 0915) Pulse Rate:  [75-84] 84 (05/23 0915) Resp:  [13-20] 13 (05/23 0915) BP: (119-130)/(45-56) 119/56 (05/23 0915) SpO2:  [93 %-100 %] 100 % (05/23 0915) Weight:  [100.2 kg] 100.2 kg (05/23 0511)  Intake/Output from previous day: 05/22 0701 - 05/23 0700 In: 472 [P.O.:472] Out: 2800 [Urine:2700; Drains:100] Intake/Output this shift: Total I/O In: 1100 [I.V.:1100] Out: 10 [Blood:10]  Recent Labs    04/21/20 1443 04/22/20 0602 04/23/20 0542 04/24/20 0600  HGB 11.4* 11.1* 10.6* 10.3*   Recent Labs    04/23/20 0542 04/24/20 0600  WBC 24.0* 22.0*  RBC 3.56* 3.41*  HCT 33.3* 32.8*  PLT 96* 88*   Recent Labs    04/23/20 0542 04/24/20 0600  NA 138 141  K 3.6 3.8  CL 101 104  CO2 28 28  BUN 15 12  CREATININE 0.75 0.80  GLUCOSE 97 95  CALCIUM 9.1 9.0   Recent Labs    04/21/20 1443  INR 1.1        Assessment/Plan: Left knee wound infection  Right knee wound infection:  Right knee Pen Rose advance, wound healing, no active signs of infection Left knee: left wound no evidence of active infection, closed over drain, necrotic tissue removed, cultures revealed staph aures, will continue vancomycin until sensativties returned, will re-look at wounds Tuesday, follow sensativities    Jaysha Lasure R Nevia Henkin 04/24/2020, 9:28 AM

## 2020-04-24 NOTE — Transfer of Care (Signed)
Immediate Anesthesia Transfer of Care Note  Patient: Morgan [name]  Procedure(s) Performed: INCISION AND DRAINAGE LEFT KNEE (Left Knee) DRESSING CHANGE UNDER ANESTHESIA right knee (Right Knee)  Patient Location: PACU  Anesthesia Type:General  Level of Consciousness: awake, alert , oriented and patient cooperative  Airway & Oxygen Therapy: Patient Spontanous Breathing and Patient connected to face mask  Post-op Assessment: Report given to RN and Post -op Vital signs reviewed and stable  Post vital signs: Reviewed and stable  Last Vitals:  Vitals Value Taken Time  BP    Temp    Pulse 83 04/24/20 0914  Resp 13 04/24/20 0914  SpO2 100 % 04/24/20 0914  Vitals shown include unvalidated device data.  Last Pain:  Vitals:   04/24/20 0511  TempSrc: Oral  PainSc:       Patients Stated Pain Goal: 2 ([id number])  Complications: No apparent anesthesia complications

## 2020-04-24 NOTE — Interval H&P Note (Signed)
History and Physical Interval Note:  04/24/2020 7:45 AM  Morgan [name]  has presented today for surgery, with the diagnosis of Septic left prepatella bursitis, open knee wound.  The various methods of treatment have been discussed with the patient and family. After consideration of risks, benefits and other options for treatment, the patient has consented to  Procedure(s): INCISION AND DRAINAGE LEFT KNEE (Left) as a surgical intervention.  The patient's history has been reviewed, patient examined, no change in status, stable for surgery.  I have reviewed the patient's chart and labs.  Questions were answered to the patient's satisfaction.     Sakira Dahmer ANDREW

## 2020-04-25 ENCOUNTER — Encounter: Payer: Self-pay | Admitting: Family Medicine

## 2020-04-25 LAB — AEROBIC/ANAEROBIC CULTURE W GRAM STAIN (SURGICAL/DEEP WOUND)

## 2020-04-25 LAB — BASIC METABOLIC PANEL
Anion gap: 5 (ref 5–15)
BUN: 15 mg/dL (ref 8–23)
CO2: 29 mmol/L (ref 22–32)
Calcium: 8.6 mg/dL — ABNORMAL LOW (ref 8.9–10.3)
Chloride: 106 mmol/L (ref 98–111)
Creatinine, Ser: 0.71 mg/dL (ref 0.44–1.00)
GFR calc Af Amer: >60 mL/min (ref >60)
GFR calc non Af Amer: >60 mL/min (ref >60)
Glucose, Bld: 100 mg/dL — ABNORMAL HIGH (ref 70–99)
Potassium: 4.2 mmol/L (ref 3.5–5.1)
Sodium: 140 mmol/L (ref 135–145)

## 2020-04-25 NOTE — Progress Notes (Signed)
Morgan [name]  [id number] DOB: [birth year] DOA: 04/21/2020 PCP: [name], MD    Brief Narrative:  69 year old with a history of HTN, HLD, RA, and psoriatic arthritis who presented to the ED on referral from her orthopedist for evaluation of bilateral knee wounds.  She fell down steps a month and a half prior to this presentation and suffered bilateral knee wounds as a result.  She required stitches to the left knee at the time.  Despite 1-2 rounds of outpatient antibiotics her knee wounds have become more inflamed and irritated.  She was referred to orthopedic surgery by her primary care physician who upon seeing her in the clinic referred her to the ED to be admitted for planned bilateral knee washout procedures.  Significant Events: 5/20 admit via [facility] 5/23 left knee I and D and wound closure   Antimicrobials:  Zosyn 5/20 > 5/23 Vancomycin 5/20 > 5/23 Ancef 5/24 >  Subjective: States she is doing "much better" again today.  States her pain is well controlled.  No chest pain or shortness of breath.  No nausea or vomiting.  Assessment & Plan:  Cellulitis/bilateral knee wounds -septic prepatellar bursitis - Staph aureus  operative management per Orthopedic Surgery -antibiotic spectrum narrowed to cefazolin today  Hypokalemia Corrected with supplementation  Normocytic anemia Likely ACD due to smoldering infection - hemoglobin stable  HTN BP controlled - follow   Rheumatoid arthritis/psoriatic arthritis Treated with injectable biologic, prednisone, and Plaquenil - no evidence of an acute flare at present - meds on hold given severity of infection  HLD  DVT prophylaxis: SCDs Code Status: FULL CODE Family Communication: No family present at time of exam today  Status is: Inpatient  Remains inpatient appropriate because:IV treatments appropriate due to intensity of illness or inability to take PO   Dispo: The patient is from: Home  Anticipated d/c is to: Home              Anticipated d/c date is: 3 days              Patient currently is not medically stable to d/c.   Consultants:  Orthopedics   Objective: Blood pressure (!) 120/51, pulse 69, temperature 97.8 F (36.6 C), temperature source Oral, resp. rate 19, height 5\' 4"  (1.626 m), weight 101.8 kg, SpO2 96 %.  Intake/Output Summary (Last 24 hours) at 04/25/2020 0714 Last data filed at 04/25/2020 0400 Gross per 24 hour  Intake 2064.31 ml  Output 710 ml  Net 1354.31 ml   Filed Weights   04/22/20 1005 04/24/20 0511 04/25/20 0530  Weight: 99.3 kg 100.2 kg 101.8 kg    Examination: General: No acute respiratory distress Lungs: CTA B  Cardiovascular: RRR Abdomen: Soft, BS positive, nontender Extremities: trace B LE edema without change  CBC: Recent Labs  Lab 04/21/20 1443 04/21/20 1443 04/22/20 0602 04/23/20 0542 04/24/20 0600  WBC 21.1*   < > 15.1* 24.0* 22.0*  NEUTROABS 12.2*  --   --   --   --   HGB 11.4*   < > 11.1* 10.6* 10.3*  HCT 35.7*   < > 35.2* 33.3* 32.8*  MCV 93.9   < > 94.9 93.5 96.2  PLT 111*   < > 97* 96* 88*   < > = values in this interval not displayed.   Basic Metabolic Panel: Recent Labs  Lab 04/21/20 1443 04/22/20 0602 04/23/20 0542 04/24/20 0600 04/25/20 0515  NA 143   < > 138 141  140  K 3.8   < > 3.6 3.8 4.2  CL 105   < > 101 104 106  CO2 26   < > 28 28 29   GLUCOSE 120*   < > 97 95 100*  BUN 24*   < > 15 12 15   CREATININE 0.87   < > 0.75 0.80 0.71  CALCIUM 9.6   < > 9.1 9.0 8.6*  MG 1.7  --  1.7  --   --    < > = values in this interval not displayed.   GFR: Estimated Creatinine Clearance: 74.8 mL/min (by C-G formula based on SCr of 0.71 mg/dL).  Liver Function Tests: Recent Labs  Lab 04/21/20 1443 04/22/20 0602  AST 31 30  ALT 22 22  ALKPHOS 53 39  BILITOT 0.7 0.8  PROT 7.3 6.6  ALBUMIN 4.2 3.9    Coagulation Profile: Recent Labs  Lab 04/21/20 1443  INR 1.1    HbA1C: Hgb A1c MFr Bld    Date/Time Value Ref Range Status  12/11/2017 07:10 AM 5.4 4.8 - 5.6 % Final    Comment:    (NOTE) Pre diabetes:          5.7%-6.4% Diabetes:              >6.4% Glycemic control for   <7.0% adults with diabetes      Recent Results (from the past 240 hour(s))  SARS Coronavirus 2 by RT PCR (hospital order, performed in [facility] hospital lab) Nasopharyngeal Nasopharyngeal Swab     Status: None   Collection Time: 04/21/20  2:43 PM   Specimen: Nasopharyngeal Swab  Result Value Ref Range Status   SARS Coronavirus 2 NEGATIVE NEGATIVE Final    Comment: (NOTE) SARS-CoV-2 target nucleic acids are NOT DETECTED. The SARS-CoV-2 RNA is generally detectable in upper and lower respiratory specimens during the acute phase of infection. The lowest concentration of SARS-CoV-2 viral copies this assay can detect is 250 copies / mL. A negative result does not preclude SARS-CoV-2 infection and should not be used as the sole basis for treatment or other patient management decisions.  A negative result may occur with improper specimen collection / handling, submission of specimen other than nasopharyngeal swab, presence of viral mutation(s) within the areas targeted by this assay, and inadequate number of viral copies (<250 copies / mL). A negative result must be combined with clinical observations, patient history, and epidemiological information. Fact Sheet for Patients:   StrictlyIdeas.no Fact Sheet for Healthcare Providers: BankingDealers.co.za This test is not yet approved or cleared  by the [place] FDA and has been authorized for detection and/or diagnosis of SARS-CoV-2 by FDA under an Emergency Use Authorization (EUA).  This EUA will remain in effect (meaning this test can be used) for the duration of the COVID-19 declaration under Section 564(b)(1) of the Act, 21 U.S.C. section 360bbb-3(b)(1), unless the authorization is terminated  or revoked sooner. Performed at [facility], [place] [address] [place], [place] [address]   Surgical PCR screen     Status: Abnormal   Collection Time: 04/22/20  5:35 AM   Specimen: Nasal Mucosa; Nasal Swab  Result Value Ref Range Status   MRSA, PCR NEGATIVE NEGATIVE Final   Staphylococcus aureus POSITIVE (A) NEGATIVE Final    Comment: (NOTE) The Xpert SA Assay (FDA approved for NASAL specimens in patients 40 years of age and older), is one component of a comprehensive surveillance program. It is not intended to diagnose infection  nor to guide or monitor treatment. Performed at [facility], [place] [address] [place], [place] [address]   Aerobic/Anaerobic Culture (surgical/deep wound)     Status: None (Preliminary result)   Collection Time: 04/22/20 12:40 PM   Specimen: Soft Tissue, Other  Result Value Ref Range Status   Specimen Description   Final    KNEE LEFT FLUID Performed at [facility] [address], [place], [place] [address]    Special Requests   Final    NONE Performed at [facility], [place] [address], [place], [place] [address]    Gram Stain   Final    MODERATE WBC PRESENT,BOTH PMN AND MONONUCLEAR FEW GRAM POSITIVE COCCI    Culture   Final    FEW STAPHYLOCOCCUS AUREUS HOLDING FOR POSSIBLE ANAEROBE Performed at [facility], [place] [address], [place], [place] [address]    Report Status PENDING  Incomplete   Organism ID, Bacteria STAPHYLOCOCCUS AUREUS  Final      Susceptibility   Staphylococcus aureus - MIC*    CIPROFLOXACIN <=0.5 SENSITIVE Sensitive     ERYTHROMYCIN <=0.25 SENSITIVE Sensitive     GENTAMICIN <=0.5 SENSITIVE Sensitive     OXACILLIN 0.5 SENSITIVE Sensitive     TETRACYCLINE >=16 RESISTANT Resistant     VANCOMYCIN 1 SENSITIVE Sensitive     TRIMETH/SULFA <=10 SENSITIVE Sensitive     CLINDAMYCIN <=0.25 SENSITIVE Sensitive     RIFAMPIN <=0.5 SENSITIVE Sensitive      Inducible Clindamycin NEGATIVE Sensitive     * FEW STAPHYLOCOCCUS AUREUS  Aerobic/Anaerobic Culture (surgical/deep wound)     Status: None (Preliminary result)   Collection Time: 04/22/20 12:40 PM   Specimen: Wound  Result Value Ref Range Status   Specimen Description   Final    KNEE RIGHT FLUID Performed at [facility] [address], [place], [place] [address]    Special Requests   Final    NONE Performed at [facility], [place] [address] [place], [place] [address]    Gram Stain   Final    ABUNDANT WBC PRESENT,BOTH PMN AND MONONUCLEAR NO ORGANISMS SEEN    Culture   Final    FEW STAPHYLOCOCCUS AUREUS RARE GROUP B STREP(S.AGALACTIAE)ISOLATED TESTING AGAINST S. AGALACTIAE NOT ROUTINELY PERFORMED DUE TO PREDICTABILITY OF AMP/PEN/VAN SUSCEPTIBILITY. Performed at [facility], [place] [address], [place], [place] [address]    Report Status PENDING  Incomplete   Organism ID, Bacteria STAPHYLOCOCCUS AUREUS  Final      Susceptibility   Staphylococcus aureus - MIC*    CIPROFLOXACIN <=0.5 SENSITIVE Sensitive     ERYTHROMYCIN <=0.25 SENSITIVE Sensitive     GENTAMICIN <=0.5 SENSITIVE Sensitive     OXACILLIN 0.5 SENSITIVE Sensitive     TETRACYCLINE >=16 RESISTANT Resistant     VANCOMYCIN 1 SENSITIVE Sensitive     TRIMETH/SULFA <=10 SENSITIVE Sensitive     CLINDAMYCIN <=0.25 SENSITIVE Sensitive     RIFAMPIN <=0.5 SENSITIVE Sensitive     Inducible Clindamycin NEGATIVE Sensitive     * FEW STAPHYLOCOCCUS AUREUS  Aerobic/Anaerobic Culture (surgical/deep wound)     Status: None (Preliminary result)   Collection Time: 04/22/20 12:40 PM   Specimen: Soft Tissue, Other  Result Value Ref Range Status   Specimen Description   Final    KNEE RIGHT TISSUE Performed at [place] [address] [place], [place] [address]    Special Requests   Final  NONE Performed at [facility], [place] [address],  [place], [place] [address]    Gram Stain   Final    FEW WBC PRESENT,BOTH PMN AND MONONUCLEAR FEW GRAM POSITIVE COCCI    Culture   Final    ABUNDANT STAPHYLOCOCCUS AUREUS MODERATE GROUP B STREP(S.AGALACTIAE)ISOLATED TESTING AGAINST S. AGALACTIAE NOT ROUTINELY PERFORMED DUE TO PREDICTABILITY OF AMP/PEN/VAN SUSCEPTIBILITY. Performed at [facility], [place] [address] [place], [place] [address]    Report Status PENDING  Incomplete   Organism ID, Bacteria STAPHYLOCOCCUS AUREUS  Final      Susceptibility   Staphylococcus aureus - MIC*    CIPROFLOXACIN <=0.5 SENSITIVE Sensitive     ERYTHROMYCIN 0.5 SENSITIVE Sensitive     GENTAMICIN <=0.5 SENSITIVE Sensitive     OXACILLIN 0.5 SENSITIVE Sensitive     TETRACYCLINE >=16 RESISTANT Resistant     VANCOMYCIN 1 SENSITIVE Sensitive     TRIMETH/SULFA <=10 SENSITIVE Sensitive     CLINDAMYCIN <=0.25 SENSITIVE Sensitive     RIFAMPIN <=0.5 SENSITIVE Sensitive     Inducible Clindamycin NEGATIVE Sensitive     * ABUNDANT STAPHYLOCOCCUS AUREUS  Aerobic/Anaerobic Culture (surgical/deep wound)     Status: None (Preliminary result)   Collection Time: 04/22/20 12:40 PM   Specimen: Wound  Result Value Ref Range Status   Specimen Description   Final    KNEE LEFT TISSUE Performed at [place] [address], [place], [place] [address]    Special Requests   Final    NONE Performed at [facility], [place] [address] [place], [place] [address]    Gram Stain   Final    ABUNDANT WBC PRESENT,BOTH PMN AND MONONUCLEAR FEW GRAM POSITIVE COCCI    Culture   Final    FEW STAPHYLOCOCCUS AUREUS RARE GROUP B STREP(S.AGALACTIAE)ISOLATED TESTING AGAINST S. AGALACTIAE NOT ROUTINELY PERFORMED DUE TO PREDICTABILITY OF AMP/PEN/VAN SUSCEPTIBILITY. Performed at [facility], [place] [address] [place], [place] [address]    Report Status PENDING  Incomplete   Organism ID, Bacteria STAPHYLOCOCCUS AUREUS  Final      Susceptibility     Staphylococcus aureus - MIC*    CIPROFLOXACIN <=0.5 SENSITIVE Sensitive     ERYTHROMYCIN <=0.25 SENSITIVE Sensitive     GENTAMICIN <=0.5 SENSITIVE Sensitive     OXACILLIN <=0.25 SENSITIVE Sensitive     TETRACYCLINE >=16 RESISTANT Resistant     VANCOMYCIN 1 SENSITIVE Sensitive     TRIMETH/SULFA <=10 SENSITIVE Sensitive     CLINDAMYCIN <=0.25 SENSITIVE Sensitive     RIFAMPIN <=0.5 SENSITIVE Sensitive     Inducible Clindamycin NEGATIVE Sensitive     * FEW STAPHYLOCOCCUS AUREUS  Acid Fast Smear (AFB)     Status: None   Collection Time: 04/22/20 12:40 PM   Specimen: Soft Tissue, Other  Result Value Ref Range Status   AFB Specimen Processing Comment  Final    Comment: Tissue Grinding and Digestion/Decontamination   Acid Fast Smear Negative  Final    Comment: (NOTE) Performed At: [facility] [place], [place] [id number] Rush Farmer MD [id number]    Source (AFB) RIGHT  Final    Comment: KNEE TISSUE Performed at [facility], [place] [address] [place], [place] [address]   Acid Fast Smear (AFB)     Status: None   Collection Time: 04/22/20 12:40 PM   Specimen: Wound  Result Value Ref Range Status   AFB Specimen Processing Comment  Final    Comment:  Tissue Grinding and Digestion/Decontamination   Acid Fast Smear Negative  Final    Comment: (NOTE) Performed At: [facility] [place], [place] [id number] Rush Farmer MD [id number]    Source (AFB) LEFT  Final    Comment: KNEE TISSUE Performed at [facility], [place] [address], [place], [place] [address]      Scheduled Meds: . aspirin EC  81 mg Oral Once per day on Mon Wed Fri  . calcium-vitamin D  1 tablet Oral BID  . cholecalciferol  2,000 Units Oral Daily  . docusate sodium  100 mg Oral BID  . doxepin  10 mg Oral BID  . enoxaparin (LOVENOX) injection  40 mg Subcutaneous Q24H  . fenofibrate  160 mg Oral QHS  . multivitamin with  minerals  1 tablet Oral Daily  . mupirocin ointment  1 application Nasal BID  . predniSONE  5 mg Oral Daily  . simvastatin  40 mg Oral QPM   Continuous Infusions: . sodium chloride 50 mL/hr at 04/24/20 1756  .  ceFAZolin (ANCEF) IV 2 g (04/25/20 0554)     LOS: 4 days   Cherene Altes, MD [facility] Office  [phone] Pager - Text Page per Amion  If 7PM-7AM, please contact night-coverage per Amion 04/25/2020, 7:14 AM

## 2020-04-25 NOTE — Progress Notes (Signed)
Patients BP 106/45. Attending physician notified, awaiting new orders.

## 2020-04-25 NOTE — Progress Notes (Addendum)
Patients BP 116/46. Second notification sent to attending physician no new orders.

## 2020-04-25 NOTE — TOC Initial Note (Signed)
Transition of Care [facility]) - Initial/Assessment Note    Patient Details  Name: Morgan [name] MRN: [id number] Date of Birth: 1951-05-08  Transition of Care [facility]) CM/SW Contact:    Trish Mage, LCSW Phone Number: 04/25/2020, 3:09 PM  Clinical Narrative:  Met with patient in follow up to PT recommendation of [facility].  Husband was bedside, and I was given permission to talk with him there.  They live together here in [place], and have the support of multiple family members.  Current DME is a walker with no wheels.  They are asking for walker with 5" wheels, 3 in 1 and wheelchair.  Will need orders at d/c.  She is hoping for [facility] in the home, but only has MCR part A and commercial insurance otherwise.  I explained the challenges with find [facility], and patient is willing to go to outpatient clinic as a back up plan. TOC will continue to follow during the course of hospitalization.                 Expected Discharge Plan: [place] Barriers to Discharge: No Barriers Identified   Patient Goals and CMS Choice Patient states their goals for this hospitalization and ongoing recovery are:: Go home      Expected Discharge Plan and Services Expected Discharge Plan: [place]   Discharge Planning Services: CM Consult Post Acute Care Choice: Durable Medical Equipment, Home Health Living arrangements for the past 2 months: Single Family Home                                      Prior Living Arrangements/Services Living arrangements for the past 2 months: Single Family Home Lives with:: Spouse Patient language and need for interpreter reviewed:: Yes Do you feel safe going back to the place where you live?: Yes      Need for Family Participation in Patient Care: Yes (Comment) Care giver support system in place?: Yes (comment)   Criminal Activity/Legal Involvement Pertinent to Current Situation/Hospitalization: No - Comment as needed  Activities of  Daily Living Home Assistive Devices/Equipment: Hearing aid, Eyeglasses ADL Screening (condition at time of admission) Patient's cognitive ability adequate to safely complete daily activities?: Yes Is the patient deaf or have difficulty hearing?: Yes Does the patient have difficulty seeing, even when wearing glasses/contacts?: No Does the patient have difficulty concentrating, remembering, or making decisions?: No Patient able to express need for assistance with ADLs?: Yes Does the patient have difficulty dressing or bathing?: No Independently performs ADLs?: Yes (appropriate for developmental age) Does the patient have difficulty walking or climbing stairs?: Yes Weakness of Legs: Both Weakness of Arms/Hands: None  Permission Sought/Granted Permission sought to share information with : Family Supports Permission granted to share information with : Yes, Verbal Permission Granted  Share Information with NAME: husband           Emotional Assessment Appearance:: Appears stated age Attitude/Demeanor/Rapport: Engaged Affect (typically observed): Appropriate Orientation: : Oriented to Self, Oriented to Place, Oriented to  Time, Oriented to Situation Alcohol / Substance Use: Not Applicable Psych Involvement: No (comment)  Admission diagnosis:  Wound dehiscence [T81.30XA] Bursitis, unspecified site [M71.9] Patient Active Problem List   Diagnosis Date Noted  . Wound dehiscence 04/21/2020  . 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 Tear of right rotator cuff 07/14/2019  . Bilateral sensorineural hearing  loss 02/02/2019  . Hypercalcemia 12/11/2018  . H/O partial resection of colon 01/31/2018  . Diverticulitis with abscess status post robotic low anterior rectosigmoid resection 12/11/2017 12/11/2017  . Rheumatoid arthritis involving multiple sites with positive rheumatoid factor ([place]) 10/31/2017  . Immunosuppressed status ([place]) 10/08/2017  . Obesity with body mass index  30 or greater 01/25/2017  . Current chronic use of systemic steroids 06/08/2016  . Uses hearing aid 12/09/2015  . Leukocytosis 08/27/2014  . Thrombocytopenia ([place]) 11/23/2013  . Fatty liver disease, nonalcoholic 57/32/2567  . Benign colon polyp 10/20/2008  . Essential hypertension 10/20/2008  . Diverticulosis of colon 10/20/2008  . Mixed hyperlipidemia 04/23/2008  . VENOUS INSUFFICIENCY 04/23/2008  . Allergic rhinitis 04/23/2008  . Gastroesophageal reflux disease without esophagitis 04/23/2008   PCP:  [name], MD Pharmacy:   [id number] [address] [id number]: [phone]: [phone]    Social Determinants of Health (SDOH) Interventions    Readmission Risk Interventions No flowsheet data found.

## 2020-04-25 NOTE — Evaluation (Addendum)
Physical Therapy Evaluation Patient Details Name: Morgan [name] MRN: [id number] DOB: 1951-11-15 Today's Date: 04/25/2020   History of Present Illness  69 yo female admitted to ED on 5/20 with bilateral knee infections, secondary to fall a few weeks ago. S/p I&D bilateral knees on 5/21 and 5/23, no longer has L knee wound vac on eval 5/24. PMH includes RA, HTN, HLD, skin cancer, venous insufficiency, R shoulder arthroplasty 09/2019.  Clinical Impression   Pt presents with LE pain L>R, unsteadiness in standing with history of falls, difficulty performing mobility tasks, and decreased activity tolerance. Pt to benefit from acute PT to address deficits. Pt required min-mod assist for bed mobility and transfer from sit to stand from very elevated bed height. PT expects pt to progress well with mobility, recommending HHPT with 24/7 assist from family post-acutely and mobility progression while acute. Per pt, plan is to progress pt to L KI only, presently pt has orders for both with transfer-only. May need w/c if pt remains in bilateral knee immobilizers and transfer-only level upon d/c. PT to progress mobility as tolerated, and will continue to follow acutely.   Of note, pt with bedrest order in at time of eval, initiated 5/20. Per RN, order to be discontinued and pt appropriate to mobilize OOB.     Follow Up Recommendations Home health PT;Supervision/Assistance - 24 hour    Equipment Recommendations  Rolling walker with 5" wheels;3in1 (PT)    Recommendations for Other Services       Precautions / Restrictions Precautions Precautions: Fall Required Braces or Orthoses: Knee Immobilizer - Left;Knee Immobilizer - Right Knee Immobilizer - Right: On at all times Knee Immobilizer - Left: On at all times Restrictions Weight Bearing Restrictions: No RLE Weight Bearing: Weight bearing as tolerated LLE Weight Bearing: Weight bearing as tolerated Other Position/Activity Restrictions: Per ortho  surgery note, "KI at all times, bed to chair transfers"      Mobility  Bed Mobility Overal bed mobility: Needs Assistance Bed Mobility: Supine to Sit     Supine to sit: HOB elevated;Mod assist     General bed mobility comments: Mod assist for LE management to EOB, scooting to EOB with use of bedpads and instructing pt in leaning laterally to offweight hips.  Transfers Overall transfer level: Needs assistance Equipment used: Rolling walker (2 wheeled) Transfers: Sit to/from [facility] Sit to Stand: Min assist;+2 physical assistance;+2 safety/equipment;From elevated surface Stand pivot transfers: Min assist;+2 physical assistance;+2 safety/equipment;From elevated surface       General transfer comment: Min assist +2 for power up, hip extension, and steadying. Verbal cuing for walking LEs within BOS when rising and walking LEs anteriorly with sitting, hand placement when rising/sitting. Pivotal steps taken to recliner with Min +2 for steadying, guiding pt to destination surface.  Ambulation/Gait             General Gait Details: not assessed this day, per surgeon note pt to start with transfer only  Stairs            Wheelchair Mobility    Modified Rankin (Stroke Patients Only)       Balance Overall balance assessment: Needs assistance;History of Falls Sitting-balance support: No upper extremity supported Sitting balance-Leahy Scale: Fair     Standing balance support: Bilateral upper extremity supported Standing balance-Leahy Scale: Poor Standing balance comment: reliant on external support  Pertinent Vitals/Pain Pain Assessment: 0-10 Pain Score: 6  Pain Location: L knee Pain Descriptors / Indicators: Sore;Discomfort;Grimacing;Guarding Pain Intervention(s): Limited activity within patient's tolerance;Repositioned;Monitored during session;Premedicated before session    Home Living Family/patient  expects to be discharged to:: Private residence Living Arrangements: Spouse/significant other Available Help at Discharge: Family Type of Home: House Home Access: Stairs to enter   [employer] of Steps: 3 on back porch with no rail, 5 at front of house Home Layout: Two level;Able to live on main level with bedroom/bathroom Home Equipment: Walker - standard      Prior Function Level of Independence: Independent with assistive device(s)         Comments: pt using standard walker for ambulation PTA, had multiple stitches and bilateral knee infections limiting mobility progression.     Hand Dominance   Dominant Hand: Right    Extremity/Trunk Assessment   Upper Extremity Assessment Upper Extremity Assessment: Overall WFL for tasks assessed    Lower Extremity Assessment Lower Extremity Assessment: Generalized weakness RLE Deficits / Details: able to hip abd/add, hip flex, and DF/PF RLE: Unable to fully assess due to immobilization LLE Deficits / Details: limited hip abd/add when moving LEs to EOB, able to initiate leg lift and perform ankle pumps LLE: Unable to fully assess due to immobilization    Cervical / Trunk Assessment Cervical / Trunk Assessment: Normal  Communication   Communication: No difficulties  Cognition Arousal/Alertness: Awake/alert Behavior During Therapy: WFL for tasks assessed/performed Overall Cognitive Status: Within Functional Limits for tasks assessed                                        General Comments      Exercises General Exercises - Lower Extremity Ankle Circles/Pumps: AROM;Both;15 reps;Seated   Assessment/Plan    PT Assessment Patient needs continued PT services  PT Problem List Decreased strength;Decreased mobility;Decreased activity tolerance;Decreased balance;Decreased range of motion;Decreased safety awareness;Decreased knowledge of use of DME;Pain       PT Treatment Interventions DME  instruction;Therapeutic activities;Gait training;Therapeutic exercise;Patient/family education;Balance training;Stair training;Neuromuscular re-education;Functional mobility training    PT Goals (Current goals can be found in the Care Plan section)  Acute Rehab PT Goals Patient Stated Goal: go home PT Goal Formulation: With patient Time For Goal Achievement: 05/09/20 Potential to Achieve Goals: Good    Frequency Min 5X/week   Barriers to discharge        Co-evaluation               AM-PAC PT "6 Clicks" Mobility  Outcome Measure Help needed turning from your back to your side while in a flat bed without using bedrails?: A Little Help needed moving from lying on your back to sitting on the side of a flat bed without using bedrails?: A Little Help needed moving to and from a bed to a chair (including a wheelchair)?: A Little Help needed standing up from a chair using your arms (e.g., wheelchair or bedside chair)?: A Little Help needed to walk in hospital room?: A Lot Help needed climbing 3-5 steps with a railing? : A Lot 6 Click Score: 16    End of Session Equipment Utilized During Treatment: Gait belt;Right knee immobilizer;Left knee immobilizer Activity Tolerance: Patient tolerated treatment well Patient left: in chair;with chair alarm set;with call bell/phone within reach Nurse Communication: Mobility status PT Visit Diagnosis: Other abnormalities of gait and mobility (R26.89);Muscle weakness (  generalized) (M62.81);Difficulty in walking, not elsewhere classified (R26.2)    Time: 1117-1140 PT Time Calculation (min) (ACUTE ONLY): 23 min   Charges:   PT Evaluation $PT Eval Low Complexity: 1 Low PT Treatments $Therapeutic Activity: 8-22 mins      Mardy Hoppe E, PT Acute Rehabilitation Services Pager [phone]  Office [phone]   Marv Alfrey D Elonda Husky 04/25/2020, 12:25 PM

## 2020-04-26 DIAGNOSIS — M71161 Other infective bursitis, right knee: Secondary | ICD-10-CM

## 2020-04-26 DIAGNOSIS — E782 Mixed hyperlipidemia: Secondary | ICD-10-CM

## 2020-04-26 DIAGNOSIS — M069 Rheumatoid arthritis, unspecified: Secondary | ICD-10-CM

## 2020-04-26 DIAGNOSIS — M71162 Other infective bursitis, left knee: Secondary | ICD-10-CM

## 2020-04-26 DIAGNOSIS — B9561 Methicillin susceptible Staphylococcus aureus infection as the cause of diseases classified elsewhere: Secondary | ICD-10-CM

## 2020-04-26 DIAGNOSIS — T8133XA Disruption of traumatic injury wound repair, initial encounter: Principal | ICD-10-CM

## 2020-04-26 DIAGNOSIS — B9689 Other specified bacterial agents as the cause of diseases classified elsewhere: Secondary | ICD-10-CM

## 2020-04-26 LAB — BASIC METABOLIC PANEL
Anion gap: 6 (ref 5–15)
BUN: 23 mg/dL (ref 8–23)
CO2: 28 mmol/L (ref 22–32)
Calcium: 8.6 mg/dL — ABNORMAL LOW (ref 8.9–10.3)
Chloride: 107 mmol/L (ref 98–111)
Creatinine, Ser: 0.78 mg/dL (ref 0.44–1.00)
GFR calc Af Amer: >60 mL/min (ref >60)
GFR calc non Af Amer: >60 mL/min (ref >60)
Glucose, Bld: 101 mg/dL — ABNORMAL HIGH (ref 70–99)
Potassium: 3.8 mmol/L (ref 3.5–5.1)
Sodium: 141 mmol/L (ref 135–145)

## 2020-04-26 LAB — CBC
HCT: 29.1 % — ABNORMAL LOW (ref 36.0–46.0)
Hemoglobin: 8.9 g/dL — ABNORMAL LOW (ref 12.0–15.0)
MCH: 29.7 pg (ref 26.0–34.0)
MCHC: 30.6 g/dL (ref 30.0–36.0)
MCV: 97 fL (ref 80.0–100.0)
Platelets: 74 10*3/uL — ABNORMAL LOW (ref 150–400)
RBC: 3 MIL/uL — ABNORMAL LOW (ref 3.87–5.11)
RDW: 15.9 % — ABNORMAL HIGH (ref 11.5–15.5)
WBC: 17.4 10*3/uL — ABNORMAL HIGH (ref 4.0–10.5)
nRBC: 0 % (ref 0.0–0.2)

## 2020-04-26 MED ORDER — SODIUM CHLORIDE 0.9 % IV SOLN
3.0000 g | Freq: Four times a day (QID) | INTRAVENOUS | Status: DC
  Administered 2020-04-26 – 2020-04-28 (×8): 3 g via INTRAVENOUS
  Filled 2020-04-26 (×2): qty 3
  Filled 2020-04-26 (×2): qty 8
  Filled 2020-04-26 (×2): qty 3
  Filled 2020-04-26: qty 8
  Filled 2020-04-26 (×2): qty 3
  Filled 2020-04-26: qty 8

## 2020-04-26 NOTE — Progress Notes (Signed)
Subjective: 2 Days Post-Op Procedure(s) (LRB): INCISION AND DRAINAGE LEFT KNEE (Left) DRESSING CHANGE UNDER ANESTHESIA right knee (Right) Patient reports pain as mild.   Patient is doing well. NO chest pain, shortness of breath Denies N/T in Bilateral lower extremities   Objective: Vital signs in last 24 hours: Temp:  [97.6 F (36.4 C)-98.2 F (36.8 C)] 97.6 F (36.4 C) (05/25 1511) Pulse Rate:  [73-80] 79 (05/25 1511) Resp:  [15-17] 16 (05/25 1511) BP: (121-142)/(52-58) 142/57 (05/25 1511) SpO2:  [97 %-100 %] 100 % (05/25 1511) Weight:  [103.4 kg] 103.4 kg (05/25 0500)  Intake/Output from previous day: 05/24 0701 - 05/25 0700 In: 240 [P.O.:240] Out: -  Intake/Output this shift: No intake/output data recorded.  Recent Labs    04/24/20 0600 04/26/20 0555  HGB 10.3* 8.9*   Recent Labs    04/24/20 0600 04/26/20 0555  WBC 22.0* 17.4*  RBC 3.41* 3.00*  HCT 32.8* 29.1*  PLT 88* 74*   Recent Labs    04/25/20 0515 04/26/20 0555  NA 140 141  K 4.2 3.8  CL 106 107  CO2 29 28  BUN 15 23  CREATININE 0.71 0.78  GLUCOSE 100* 101*  CALCIUM 8.6* 8.6*   No results for input(s): LABPT, INR in the last 72 hours.  On exam on right knee: incision site looks good no active signs of infection. Minimal drainage at site of drain. No drainage with ROM of right knee. Minimal ROM of right knee due to subjective stiffness. Calf is supple. NVI in right lower extremity On exam of left knee: incision site healing nicely. No active sign of infection. No drainage at this time. Limited ROM of left knee. Calf is supple. NVI in left lower extremity  Bilateral penrose drains removed at bedside today.    Assessment/Plan: 2 Days Post-Op Procedure(s) (LRB): INCISION AND DRAINAGE LEFT KNEE (Left) DRESSING CHANGE UNDER ANESTHESIA right knee (Right) -Cultures and sensativites are back, MSSA and streph. Would recommend IV abx while here and then switch to PO at discharge for a couple more  weeks.  -Dressings changed bedside today. Would recommend daily dressing changes: 4x4, ABD,  Krelex, ACE by nurse -PT for bilateral lower extremity. Right lower extremity gentle ROM as tolerated, left knee continue in immobilizer, WBAT -Okay for discharge when cleared by hospitalist. Would recommend follow up in the office in 1 week. -We will continue to monitor patient while here in hospital.     Drue Novel, PA-C [facility] 04/26/2020, 3:18 PM

## 2020-04-26 NOTE — Progress Notes (Signed)
PHARMACY NOTE -  Unasyn  Pharmacy has been assisting with dosing of Unasyn for wound infection.  Dosage remains stable at 3g IV q6 hr and need for further dosage adjustment appears unlikely at present given renal function at baseline  Pharmacy will sign off, following peripherally for culture results or dose adjustments. Please reconsult if a change in clinical status warrants re-evaluation of dosage.  Reuel Boom, PharmD, BCPS [phone] 04/26/2020, 1:57 PM

## 2020-04-26 NOTE — Progress Notes (Signed)
Morgan [name]  [id number] DOB: 02-20-1951 DOA: 04/21/2020 PCP: [name], MD    Brief Narrative:  69 year old with a history of HTN, HLD, RA, and psoriatic arthritis who presented to the ED on referral from her orthopedist for evaluation of bilateral knee wounds.  She fell down steps a month and a half prior to this presentation and suffered bilateral knee wounds as a result.  She required stitches to the left knee at the time.  Despite 1-2 rounds of outpatient antibiotics her knee wounds have become more inflamed and irritated.  She was referred to orthopedic surgery by her primary care physician who upon seeing her in the clinic referred her to the ED to be admitted for planned bilateral knee washout procedures.  Significant Events: 5/20 admit via [facility] 5/23 left knee I and D and wound closure   Antimicrobials:  Zosyn 5/20 > 5/23 Vancomycin 5/20 > 5/23 Ancef 5/24  Unasyn 5/25 >  Subjective: Resting comfortably in bed.  In good spirits.  Denies chest pain nausea vomiting abdominal pain.  States that her leg pain is well controlled at present.  Assessment & Plan:  Cellulitis/bilateral knee wounds -septic prepatellar bursitis - Staph aureus  operative management per Orthopedic Surgery -antibiotic spectrum narrowed to cefazolin 5/24 - ID asked to see the patient today and suggested changing to Unasyn to cover Prevotella and group B strep of unclear significance isolated and some of the cultures -plan is to change to Augmentin at time of discharge -wound care and therapy needs at discretion of orthopedics  Hypokalemia Corrected with supplementation  Normocytic anemia Likely ACD due to smoldering infection -recheck in a.m.  HTN BP controlled - follow   Rheumatoid arthritis/psoriatic arthritis Treated with injectable biologic, prednisone, and Plaquenil - no evidence of an acute flare at present - meds on hold given severity of infection  HLD  DVT prophylaxis:  SCDs Code Status: FULL CODE Family Communication: No family present at time of exam today  Status is: Inpatient  Remains inpatient appropriate because:IV treatments appropriate due to intensity of illness or inability to take PO   Dispo: The patient is from: Home              Anticipated d/c is to: Home              Anticipated d/c date is: depending upon progress w/ PT and wound care needs per Orthopedics               Patient currently is not medically stable to d/c.   Consultants:  Orthopedics   Objective: Blood pressure (!) 132/58, pulse 73, temperature 97.7 F (36.5 C), temperature source Oral, resp. rate 17, height 5\' 4"  (1.626 m), weight 103.4 kg, SpO2 97 %.  Intake/Output Summary (Last 24 hours) at 04/26/2020 0726 Last data filed at 04/25/2020 0900 Gross per 24 hour  Intake 240 ml  Output --  Net 240 ml   Filed Weights   04/24/20 0511 04/25/20 0530 04/26/20 0500  Weight: 100.2 kg 101.8 kg 103.4 kg    Examination: General: No acute respiratory distress Lungs: CTA B without wheezing Cardiovascular: RRR Abdomen: Soft, BS positive, nontender Extremities: No significant edema bilateral lower extremities  CBC: Recent Labs  Lab 04/21/20 1443 04/22/20 0602 04/23/20 0542 04/24/20 0600 04/26/20 0555  WBC 21.1*   < > 24.0* 22.0* 17.4*  NEUTROABS 12.2*  --   --   --   --   HGB 11.4*   < >  10.6* 10.3* 8.9*  HCT 35.7*   < > 33.3* 32.8* 29.1*  MCV 93.9   < > 93.5 96.2 97.0  PLT 111*   < > 96* 88* 74*   < > = values in this interval not displayed.   Basic Metabolic Panel: Recent Labs  Lab 04/21/20 1443 04/22/20 0602 04/23/20 0542 04/23/20 0542 04/24/20 0600 04/25/20 0515 04/26/20 0555  NA 143   < > 138   < > 141 140 141  K 3.8   < > 3.6   < > 3.8 4.2 3.8  CL 105   < > 101   < > 104 106 107  CO2 26   < > 28   < > 28 29 28   GLUCOSE 120*   < > 97   < > 95 100* 101*  BUN 24*   < > 15   < > 12 15 23   CREATININE 0.87   < > 0.75   < > 0.80 0.71 0.78  CALCIUM  9.6   < > 9.1   < > 9.0 8.6* 8.6*  MG 1.7  --  1.7  --   --   --   --    < > = values in this interval not displayed.   GFR: Estimated Creatinine Clearance: 75.6 mL/min (by C-G formula based on SCr of 0.78 mg/dL).  Liver Function Tests: Recent Labs  Lab 04/21/20 1443 04/22/20 0602  AST 31 30  ALT 22 22  ALKPHOS 53 39  BILITOT 0.7 0.8  PROT 7.3 6.6  ALBUMIN 4.2 3.9    Coagulation Profile: Recent Labs  Lab 04/21/20 1443  INR 1.1    HbA1C: Hgb A1c MFr Bld  Date/Time Value Ref Range Status  12/11/2017 07:10 AM 5.4 4.8 - 5.6 % Final    Comment:    (NOTE) Pre diabetes:          5.7%-6.4% Diabetes:              >6.4% Glycemic control for   <7.0% adults with diabetes      Recent Results (from the past 240 hour(s))  SARS Coronavirus 2 by RT PCR (hospital order, performed in [facility] hospital lab) Nasopharyngeal Nasopharyngeal Swab     Status: None   Collection Time: 04/21/20  2:43 PM   Specimen: Nasopharyngeal Swab  Result Value Ref Range Status   SARS Coronavirus 2 NEGATIVE NEGATIVE Final    Comment: (NOTE) SARS-CoV-2 target nucleic acids are NOT DETECTED. The SARS-CoV-2 RNA is generally detectable in upper and lower respiratory specimens during the acute phase of infection. The lowest concentration of SARS-CoV-2 viral copies this assay can detect is 250 copies / mL. A negative result does not preclude SARS-CoV-2 infection and should not be used as the sole basis for treatment or other patient management decisions.  A negative result may occur with improper specimen collection / handling, submission of specimen other than nasopharyngeal swab, presence of viral mutation(s) within the areas targeted by this assay, and inadequate number of viral copies (<250 copies / mL). A negative result must be combined with clinical observations, patient history, and epidemiological information. Fact Sheet for Patients:   StrictlyIdeas.no Fact Sheet  for Healthcare Providers: BankingDealers.co.za This test is not yet approved or cleared  by the [place] FDA and has been authorized for detection and/or diagnosis of SARS-CoV-2 by FDA under an Emergency Use Authorization (EUA).  This EUA will remain in effect (meaning this test can be used) for  the duration of the COVID-19 declaration under Section 564(b)(1) of the Act, 21 U.S.C. section 360bbb-3(b)(1), unless the authorization is terminated or revoked sooner. Performed at [facility], [place] [address], [place], [place] [address]   Surgical PCR screen     Status: Abnormal   Collection Time: 04/22/20  5:35 AM   Specimen: Nasal Mucosa; Nasal Swab  Result Value Ref Range Status   MRSA, PCR NEGATIVE NEGATIVE Final   Staphylococcus aureus POSITIVE (A) NEGATIVE Final    Comment: (NOTE) The Xpert SA Assay (FDA approved for NASAL specimens in patients 26 years of age and older), is one component of a comprehensive surveillance program. It is not intended to diagnose infection nor to guide or monitor treatment. Performed at [facility], [place] [address], [place], [place] [address]   Aerobic/Anaerobic Culture (surgical/deep wound)     Status: None   Collection Time: 04/22/20 12:40 PM   Specimen: Soft Tissue, Other  Result Value Ref Range Status   Specimen Description   Final    KNEE LEFT FLUID Performed at [facility] [address], [place], [place] [address]    Special Requests   Final    NONE Performed at [facility], [place] [address], [place], [place] [address]    Gram Stain   Final    MODERATE WBC PRESENT,BOTH PMN AND MONONUCLEAR FEW GRAM POSITIVE COCCI    Culture   Final    FEW STAPHYLOCOCCUS AUREUS RARE PREVOTELLA MELANINOGENICA BETA LACTAMASE POSITIVE Performed at [facility], [place] [address], [place], [place] [address]    Report Status 04/25/2020 FINAL  Final    Organism ID, Bacteria STAPHYLOCOCCUS AUREUS  Final      Susceptibility   Staphylococcus aureus - MIC*    CIPROFLOXACIN <=0.5 SENSITIVE Sensitive     ERYTHROMYCIN <=0.25 SENSITIVE Sensitive     GENTAMICIN <=0.5 SENSITIVE Sensitive     OXACILLIN 0.5 SENSITIVE Sensitive     TETRACYCLINE >=16 RESISTANT Resistant     VANCOMYCIN 1 SENSITIVE Sensitive     TRIMETH/SULFA <=10 SENSITIVE Sensitive     CLINDAMYCIN <=0.25 SENSITIVE Sensitive     RIFAMPIN <=0.5 SENSITIVE Sensitive     Inducible Clindamycin NEGATIVE Sensitive     * FEW STAPHYLOCOCCUS AUREUS  Aerobic/Anaerobic Culture (surgical/deep wound)     Status: None (Preliminary result)   Collection Time: 04/22/20 12:40 PM   Specimen: Wound  Result Value Ref Range Status   Specimen Description   Final    KNEE RIGHT FLUID Performed at [facility] [address] [place], [place] [address]    Special Requests   Final    NONE Performed at [facility], [place] [address] [place], [place] [address]    Gram Stain   Final    ABUNDANT WBC PRESENT,BOTH PMN AND MONONUCLEAR NO ORGANISMS SEEN Performed at [facility], [place] [address], [place], [place] [address]    Culture   Final    FEW STAPHYLOCOCCUS AUREUS RARE GROUP B STREP(S.AGALACTIAE)ISOLATED TESTING AGAINST S. AGALACTIAE NOT ROUTINELY PERFORMED DUE TO PREDICTABILITY OF AMP/PEN/VAN SUSCEPTIBILITY. NO ANAEROBES ISOLATED; CULTURE IN PROGRESS FOR 5 DAYS    Report Status PENDING  Incomplete   Organism ID, Bacteria STAPHYLOCOCCUS AUREUS  Final      Susceptibility   Staphylococcus aureus - MIC*    CIPROFLOXACIN <=0.5 SENSITIVE Sensitive     ERYTHROMYCIN <=0.25 SENSITIVE Sensitive     GENTAMICIN <=0.5 SENSITIVE Sensitive  OXACILLIN 0.5 SENSITIVE Sensitive     TETRACYCLINE >=16 RESISTANT Resistant     VANCOMYCIN 1 SENSITIVE Sensitive     TRIMETH/SULFA <=10 SENSITIVE Sensitive     CLINDAMYCIN <=0.25 SENSITIVE Sensitive     RIFAMPIN <=0.5  SENSITIVE Sensitive     Inducible Clindamycin NEGATIVE Sensitive     * FEW STAPHYLOCOCCUS AUREUS  Aerobic/Anaerobic Culture (surgical/deep wound)     Status: None (Preliminary result)   Collection Time: 04/22/20 12:40 PM   Specimen: Soft Tissue, Other  Result Value Ref Range Status   Specimen Description   Final    KNEE RIGHT TISSUE Performed at [facility] [address] [place], [place] [address]    Special Requests   Final    NONE Performed at [facility], [place] [address] [place], [place] [address]    Gram Stain   Final    FEW WBC PRESENT,BOTH PMN AND MONONUCLEAR FEW GRAM POSITIVE COCCI    Culture   Final    ABUNDANT STAPHYLOCOCCUS AUREUS MODERATE GROUP B STREP(S.AGALACTIAE)ISOLATED TESTING AGAINST S. AGALACTIAE NOT ROUTINELY PERFORMED DUE TO PREDICTABILITY OF AMP/PEN/VAN SUSCEPTIBILITY. Performed at [facility], [place] [address], [place], [place] [address]    Report Status PENDING  Incomplete   Organism ID, Bacteria STAPHYLOCOCCUS AUREUS  Final      Susceptibility   Staphylococcus aureus - MIC*    CIPROFLOXACIN <=0.5 SENSITIVE Sensitive     ERYTHROMYCIN 0.5 SENSITIVE Sensitive     GENTAMICIN <=0.5 SENSITIVE Sensitive     OXACILLIN 0.5 SENSITIVE Sensitive     TETRACYCLINE >=16 RESISTANT Resistant     VANCOMYCIN 1 SENSITIVE Sensitive     TRIMETH/SULFA <=10 SENSITIVE Sensitive     CLINDAMYCIN <=0.25 SENSITIVE Sensitive     RIFAMPIN <=0.5 SENSITIVE Sensitive     Inducible Clindamycin NEGATIVE Sensitive     * ABUNDANT STAPHYLOCOCCUS AUREUS  Aerobic/Anaerobic Culture (surgical/deep wound)     Status: None (Preliminary result)   Collection Time: 04/22/20 12:40 PM   Specimen: Wound  Result Value Ref Range Status   Specimen Description   Final    KNEE LEFT TISSUE Performed at [facility] [address], [place], [place] [address]    Special Requests   Final    NONE Performed at [facility], [place] [address] [place], [place] [address]    Gram Stain   Final    ABUNDANT WBC PRESENT,BOTH PMN AND MONONUCLEAR FEW GRAM POSITIVE COCCI    Culture   Final    FEW STAPHYLOCOCCUS AUREUS RARE GROUP B STREP(S.AGALACTIAE)ISOLATED TESTING AGAINST S. AGALACTIAE NOT ROUTINELY PERFORMED DUE TO PREDICTABILITY OF AMP/PEN/VAN SUSCEPTIBILITY. Performed at [facility], [place] [address], [place], [place] [address]    Report Status PENDING  Incomplete   Organism ID, Bacteria STAPHYLOCOCCUS AUREUS  Final      Susceptibility   Staphylococcus aureus - MIC*    CIPROFLOXACIN <=0.5 SENSITIVE Sensitive     ERYTHROMYCIN <=0.25 SENSITIVE Sensitive     GENTAMICIN <=0.5 SENSITIVE Sensitive     OXACILLIN <=0.25 SENSITIVE Sensitive     TETRACYCLINE >=16 RESISTANT Resistant     VANCOMYCIN 1 SENSITIVE Sensitive     TRIMETH/SULFA <=10 SENSITIVE Sensitive     CLINDAMYCIN <=0.25 SENSITIVE Sensitive     RIFAMPIN <=0.5 SENSITIVE Sensitive     Inducible Clindamycin NEGATIVE Sensitive     * FEW STAPHYLOCOCCUS AUREUS  Acid Fast Smear (AFB)     Status: None   Collection  Time: 04/22/20 12:40 PM   Specimen: Soft Tissue, Other  Result Value Ref Range Status   AFB Specimen Processing Comment  Final    Comment: Tissue Grinding and Digestion/Decontamination   Acid Fast Smear Negative  Final    Comment: (NOTE) Performed At: [facility] [place], [place] [id number] Rush Farmer MD [id number]    Source (AFB) RIGHT  Final    Comment: KNEE TISSUE Performed at [facility], [place] [address], [place], [place] [address]   Acid Fast Smear (AFB)     Status: None   Collection Time: 04/22/20 12:40 PM   Specimen: Wound  Result Value Ref Range Status   AFB Specimen Processing Comment  Final    Comment: Tissue Grinding and Digestion/Decontamination   Acid Fast Smear Negative  Final    Comment: (NOTE) Performed At: [facility] [place], [place] [id number] Rush Farmer MD [id number]    Source (AFB) LEFT  Final    Comment: KNEE TISSUE Performed at [facility], [place] [address], [place], [place] [address]      Scheduled Meds: . aspirin EC  81 mg Oral Once per day on Mon Wed Fri  . calcium-vitamin D  1 tablet Oral BID  . cholecalciferol  2,000 Units Oral Daily  . docusate sodium  100 mg Oral BID  . doxepin  10 mg Oral BID  . enoxaparin (LOVENOX) injection  40 mg Subcutaneous Q24H  . fenofibrate  160 mg Oral QHS  . multivitamin with minerals  1 tablet Oral Daily  . mupirocin ointment  1 application Nasal BID  . predniSONE  5 mg Oral Daily  . simvastatin  40 mg Oral QPM   Continuous Infusions: . sodium chloride 10 mL/hr at 04/26/20 0000  .  ceFAZolin (ANCEF) IV 2 g (04/26/20 0544)     LOS: 5 days   Cherene Altes, MD [facility] Office  [phone] Pager - Text Page per Amion  If 7PM-7AM, please contact night-coverage per Amion 04/26/2020, 7:26 AM

## 2020-04-26 NOTE — Progress Notes (Signed)
Physical Therapy Treatment Patient Details Name: Morgan [name] MRN: [id number] DOB: 19-Aug-1951 Today's Date: 04/26/2020    History of Present Illness 69 yo female admitted to ED on 5/20 with bilateral knee infections, secondary to fall a few weeks ago. S/p I&D bilateral knees on 5/21 and 5/23, no longer has L knee wound vac on eval 5/24. PMH includes RA, HTN, HLD, skin cancer, venous insufficiency, R shoulder arthroplasty 09/2019.    PT Comments    Patient making steady progress with Acute PT and performed repeated sit<>stands to facilitate proper sequencing. Pt was able to sequence steps and trunk lean from lower surface today to complete sit<>stand at EOB. Min assist provided for power up and to steady. Pt remained in safe position to RW during stand step transfer to recliner and ankle pumps reviewed. She reports her husband is working on setting up a ramp at the back entrance so she will not need to navigate stairs to get home. She will benefit from a wheelchair to be able to get up/down the ramp safely. Acute PT will continue to progress mobility as pt is able and once orthopedic surgeons clear her to progress beyond chair to bed mobility.     Follow Up Recommendations  Home health PT;Supervision/Assistance - 24 hour     Equipment Recommendations  Rolling walker with 5" wheels;3in1 (PT)    Recommendations for Other Services       Precautions / Restrictions Precautions Precautions: Fall Required Braces or Orthoses: Knee Immobilizer - Left;Knee Immobilizer - Right Knee Immobilizer - Right: On at all times Knee Immobilizer - Left: On at all times Restrictions Weight Bearing Restrictions: No RLE Weight Bearing: Weight bearing as tolerated LLE Weight Bearing: Weight bearing as tolerated Other Position/Activity Restrictions: Per ortho surgery note, "KI at all times, bed to chair transfers"(waiting to hear when mobility can progress further)    Mobility  Bed Mobility Overal bed  mobility: Needs Assistance Bed Mobility: Supine to Sit     Supine to sit: Min assist;HOB elevated     General bed mobility comments: cues for using bed rail and assist to mobilize Lt LE to EOB. Assist to raise trunk and pt able to use bil UE to scoot to EOB and get feet on floor.  Transfers Overall transfer level: Needs assistance Equipment used: Rolling walker (2 wheeled) Transfers: Sit to/from [facility] Sit to Stand: Min assist;+2 safety/equipment;From elevated surface Stand pivot transfers: Min assist;+2 safety/equipment;From elevated surface       General transfer comment: Verbal cues for sequencing steps to walk LE's posteriorly to stand and anteriorly to sit. VC's for "nose over toes" when initiating power up to rise to RW. pt able to sequence side steps in RW to move to recliner and maintained safe proximity to RW. Pt performed 5x sit<>stand at EOB for practice with sequencing and moved to recliner on last stand.   Ambulation/Gait        General Gait Details: not progressed to gait today, waiting on clarification from surgeons on when pt is safe to progress.   Stairs      Wheelchair Mobility    Modified Rankin (Stroke Patients Only)       Balance Overall balance assessment: Needs assistance;History of Falls Sitting-balance support: No upper extremity supported Sitting balance-Leahy Scale: Fair     Standing balance support: Bilateral upper extremity supported Standing balance-Leahy Scale: Poor Standing balance comment: reliant on external support           Cognition  Arousal/Alertness: Awake/alert Behavior During Therapy: WFL for tasks assessed/performed Overall Cognitive Status: Within Functional Limits for tasks assessed               Exercises General Exercises - Lower Extremity Ankle Circles/Pumps: AROM;Both;20 reps;Seated    General Comments        Pertinent Vitals/Pain Pain Assessment: 0-10 Pain Score: 2  Pain  Location: L knee Pain Descriptors / Indicators: Sore;Discomfort;Grimacing;Guarding Pain Intervention(s): Limited activity within patient's tolerance;Monitored during session;Repositioned           PT Goals (current goals can now be found in the care plan section) Acute Rehab PT Goals Patient Stated Goal: go home PT Goal Formulation: With patient Time For Goal Achievement: 05/09/20 Potential to Achieve Goals: Good Progress towards PT goals: Progressing toward goals    Frequency    Min 5X/week      PT Plan Current plan remains appropriate       AM-PAC PT "6 Clicks" Mobility   Outcome Measure  Help needed turning from your back to your side while in a flat bed without using bedrails?: A Little Help needed moving from lying on your back to sitting on the side of a flat bed without using bedrails?: A Little Help needed moving to and from a bed to a chair (including a wheelchair)?: A Little Help needed standing up from a chair using your arms (e.g., wheelchair or bedside chair)?: A Little Help needed to walk in hospital room?: A Little Help needed climbing 3-5 steps with a railing? : A Lot 6 Click Score: 17    End of Session Equipment Utilized During Treatment: Gait belt;Right knee immobilizer;Left knee immobilizer Activity Tolerance: Patient tolerated treatment well Patient left: in chair;with chair alarm set;with call bell/phone within reach Nurse Communication: Mobility status PT Visit Diagnosis: Other abnormalities of gait and mobility (R26.89);Muscle weakness (generalized) (M62.81);Difficulty in walking, not elsewhere classified (R26.2)     Time: [id number] PT Time Calculation (min) (ACUTE ONLY): 17 min  Charges:  $Therapeutic Activity: 8-22 mins                     Verner Mould, DPT Physical Therapist with [facility] [phone]  04/26/2020 1:48 PM

## 2020-04-26 NOTE — Consult Note (Signed)
[facility]       Reason for Consult: bursitis    Referring Physician: Dr. Thereasa Solo  Active Problems:   Mixed hyperlipidemia   Essential hypertension   Wound dehiscence   . aspirin EC  81 mg Oral Once per day on Mon Wed Fri  . calcium-vitamin D  1 tablet Oral BID  . cholecalciferol  2,000 Units Oral Daily  . docusate sodium  100 mg Oral BID  . doxepin  10 mg Oral BID  . enoxaparin (LOVENOX) injection  40 mg Subcutaneous Q24H  . fenofibrate  160 mg Oral QHS  . multivitamin with minerals  1 tablet Oral Daily  . mupirocin ointment  1 application Nasal BID  . predniSONE  5 mg Oral Daily  . simvastatin  40 mg Oral QPM    Recommendations: Ampicillin/sulbactam Continue Augmentin at discharge for 3 weeks total Will arrange ID follow up  Assessment: She developed bilateral knee infections involving the bursa with necrotic tissue on each side but fortunately did not involve the knee joint/septic arthritis.  Cultures on both sides are positive for MSSA but also Prevotella on one side and group B Strep on the other side of unknown significance.  Due to the polymicrobial nature, I will change her to ampicillin/sulbactam to include the anaerobic coverage and can use Augmentin at discharge.    Antibiotics: cefazolin  HPI: LYNNZIE OSHIELDS is a 69 y.o. female with RA and psoriatic arthritis came with a recent fall, landing on both knees.  She developed wounds on both and needed stitches of the left knee.  The wounds progressed despite antibiotics and sent to the ED for management by orthopedics.  She underwent debridement and noted septic perpatellar bursitis on both sides without joint involvement.  Cultures as above with MSSA, GBS and Prevotella.  MSSA in all cultures.  Improving now.    Review of Systems:  Constitutional: negative for fevers and chills Gastrointestinal: negative for diarrhea All other systems reviewed and are negative    Past Medical  History:  Diagnosis Date  . Abnormal chest x-ray   . Abnormal electrocardiogram   . Abscess of sigmoid colon due to diverticulitis 11/04/2017  . Allergic rhinitis   . Allergy   . Cancer ([facility])    Skin non melanoma  . Cataract    forming   . Colon polyp    polypoid colorectal mucosa/adenomatous  . Complication of anesthesia    and cries  . Contact dermatitis due to poison ivy   . Cystitis   . Diverticulosis of colon   . Fatty liver disease, nonalcoholic   . GERD (gastroesophageal reflux disease)    occasional  . Hearing loss   . Hemorrhoids   . Hyperlipidemia   . Hypertension   . Lymphocytosis   . Overweight(278.02)   . Pneumonia    2017  . PONV (postoperative nausea and vomiting)   . RA (rheumatoid arthritis) (H[facility])    Rheumatoid  . Venous insufficiency     Social History   Tobacco Use  . Smoking status: Former Smoker    Packs/day: 0.50    Years: 15.00    Pack years: 7.50    Types: Cigarettes    Quit date: 12/03/2001    Years since quitting: 18.4  . Smokeless tobacco: Never Used  Substance Use Topics  . Alcohol use: Yes    Comment: occasional  . Drug use: No    Family History  Problem Relation Age of  Onset  . Hyperlipidemia Mother   . Hypertension Mother   . COPD Mother   . Arthritis Mother   . Heart disease Mother   . Atrial fibrillation Mother   . Diabetes Sister   . Healthy Sister   . Heart disease Sister        sister #1  . Breast cancer Other        half-sister  . Liver disease Sister        sister #1 - hx of liver transplant 14+ yrs ago  . Diabetes Maternal Uncle   . Heart failure Brother   . Diabetes Maternal Aunt        x 2  . Heart failure Son   . Colon cancer Neg Hx   . Colon polyps Neg Hx   . Esophageal cancer Neg Hx   . Rectal cancer Neg Hx   . Stomach cancer Neg Hx     Allergies  Allergen Reactions  . Remicade [Infliximab] Swelling and Other (See Comments)    Facial swelling, throat was closing up.     Physical  Exam: Constitutional: in no apparent distress  Vitals:   04/25/20 2035 04/26/20 0448  BP: (!) 121/52 (!) 132/58  Pulse: 80 73  Resp: 15 17  Temp: 98.2 F (36.8 C) 97.7 F (36.5 C)  SpO2: 98% 97%   EYES: anicteric Cardiovascular: Cor RRR Respiratory: clear; Musculoskeletal: both legs wrapped Skin: negatives: no rash Neuro: non-focal  Lab Results  Component Value Date   WBC 17.4 (H) 04/26/2020   HGB 8.9 (L) 04/26/2020   HCT 29.1 (L) 04/26/2020   MCV 97.0 04/26/2020   PLT 74 (L) 04/26/2020    Lab Results  Component Value Date   CREATININE 0.78 04/26/2020   BUN 23 04/26/2020   NA 141 04/26/2020   K 3.8 04/26/2020   CL 107 04/26/2020   CO2 28 04/26/2020    Lab Results  Component Value Date   ALT 22 04/22/2020   AST 30 04/22/2020   ALKPHOS 39 04/22/2020     Microbiology: Recent Results (from the past 240 hour(s))  SARS Coronavirus 2 by RT PCR (hospital order, performed in [place] hospital lab) Nasopharyngeal Nasopharyngeal Swab     Status: None   Collection Time: 04/21/20  2:43 PM   Specimen: Nasopharyngeal Swab  Result Value Ref Range Status   SARS Coronavirus 2 NEGATIVE NEGATIVE Final    Comment: (NOTE) SARS-CoV-2 target nucleic acids are NOT DETECTED. The SARS-CoV-2 RNA is generally detectable in upper and lower respiratory specimens during the acute phase of infection. The lowest concentration of SARS-CoV-2 viral copies this assay can detect is 250 copies / mL. A negative result does not preclude SARS-CoV-2 infection and should not be used as the sole basis for treatment or other patient management decisions.  A negative result may occur with improper specimen collection / handling, submission of specimen other than nasopharyngeal swab, presence of viral mutation(s) within the areas targeted by this assay, and inadequate number of viral copies (<250 copies / mL). A negative result must be combined with clinical observations, patient history, and  epidemiological information. Fact Sheet for Patients:   StrictlyIdeas.no Fact Sheet for Healthcare Providers: BankingDealers.co.za This test is not yet approved or cleared  by the [place] FDA and has been authorized for detection and/or diagnosis of SARS-CoV-2 by FDA under an Emergency Use Authorization (EUA).  This EUA will remain in effect (meaning this test can be used) for the duration of  the COVID-19 declaration under Section 564(b)(1) of the Act, 21 U.S.C. section 360bbb-3(b)(1), unless the authorization is terminated or revoked sooner. Performed at [facility], [place] [address] [place], [place] [address]   Surgical PCR screen     Status: Abnormal   Collection Time: 04/22/20  5:35 AM   Specimen: Nasal Mucosa; Nasal Swab  Result Value Ref Range Status   MRSA, PCR NEGATIVE NEGATIVE Final   Staphylococcus aureus POSITIVE (A) NEGATIVE Final    Comment: (NOTE) The Xpert SA Assay (FDA approved for NASAL specimens in patients 54 years of age and older), is one component of a comprehensive surveillance program. It is not intended to diagnose infection nor to guide or monitor treatment. Performed at [facility], [place] [address], [place], [place] [address]   Fungus Culture With Stain     Status: None (Preliminary result)   Collection Time: 04/22/20 12:40 PM   Specimen: Wound  Result Value Ref Range Status   Fungus Stain Final report  Final    Comment: (NOTE) Performed At: [facility] [place], [place] [id number] Rush Farmer MD [id number]    Fungus (Mycology) Culture PENDING  Incomplete   Fungal Source RIGHT  Final    Comment: KNEE FLUID Performed at [facility], [place] [address] [place], [place] [address]   Fungus Culture With Stain     Status: None (Preliminary result)   Collection Time: 04/22/20 12:40 PM   Specimen: Soft Tissue, Other    Result Value Ref Range Status   Fungus Stain Final report  Final    Comment: (NOTE) Performed At: [facility] [place], [place] [id number] Rush Farmer MD [id number]    Fungus (Mycology) Culture PENDING  Incomplete   Fungal Source RIGHT  Final    Comment: KNEE TISSUE Performed at [facility], [place] [address] [place], [place] [address]   Fungus Culture With Stain     Status: None (Preliminary result)   Collection Time: 04/22/20 12:40 PM   Specimen: Wound  Result Value Ref Range Status   Fungus Stain Final report  Final    Comment: (NOTE) Performed At: [facility] [place], [place] [id number] Rush Farmer MD [id number]    Fungus (Mycology) Culture PENDING  Incomplete   Fungal Source LEFT  Final    Comment: KNEE TISSUE Performed at [facility], [place] [address], [place], [place] [address]   Aerobic/Anaerobic Culture (surgical/deep wound)     Status: None   Collection Time: 04/22/20 12:40 PM   Specimen: Soft Tissue, Other  Result Value Ref Range Status   Specimen Description   Final    KNEE LEFT FLUID Performed at [place] [address] [place], [place] [address]    Special Requests   Final    NONE Performed at [facility], [place] [address] [place], [place] [address]    Gram Stain   Final    MODERATE WBC PRESENT,BOTH PMN AND MONONUCLEAR FEW GRAM POSITIVE COCCI    Culture   Final    FEW STAPHYLOCOCCUS AUREUS RARE PREVOTELLA MELANINOGENICA BETA LACTAMASE POSITIVE Performed at [facility], [place] [address] [place], [place] [address]    Report Status 04/25/2020 FINAL  Final   Organism ID, Bacteria STAPHYLOCOCCUS AUREUS  Final      Susceptibility   Staphylococcus aureus - MIC*    CIPROFLOXACIN <=0.5 SENSITIVE Sensitive     ERYTHROMYCIN <=  0.25 SENSITIVE Sensitive     GENTAMICIN <=0.5 SENSITIVE Sensitive     OXACILLIN 0.5  SENSITIVE Sensitive     TETRACYCLINE >=16 RESISTANT Resistant     VANCOMYCIN 1 SENSITIVE Sensitive     TRIMETH/SULFA <=10 SENSITIVE Sensitive     CLINDAMYCIN <=0.25 SENSITIVE Sensitive     RIFAMPIN <=0.5 SENSITIVE Sensitive     Inducible Clindamycin NEGATIVE Sensitive     * FEW STAPHYLOCOCCUS AUREUS  Aerobic/Anaerobic Culture (surgical/deep wound)     Status: None (Preliminary result)   Collection Time: 04/22/20 12:40 PM   Specimen: Wound  Result Value Ref Range Status   Specimen Description   Final    KNEE RIGHT FLUID Performed at [facility] [address] [place], [place] [address]    Special Requests   Final    NONE Performed at [facility], [place] [address], [place], [place] [address]    Gram Stain   Final    ABUNDANT WBC PRESENT,BOTH PMN AND MONONUCLEAR NO ORGANISMS SEEN Performed at [facility], [place] [address], [place], [place] [address]    Culture   Final    FEW STAPHYLOCOCCUS AUREUS RARE GROUP B STREP(S.AGALACTIAE)ISOLATED TESTING AGAINST S. AGALACTIAE NOT ROUTINELY PERFORMED DUE TO PREDICTABILITY OF AMP/PEN/VAN SUSCEPTIBILITY. NO ANAEROBES ISOLATED; CULTURE IN PROGRESS FOR 5 DAYS    Report Status PENDING  Incomplete   Organism ID, Bacteria STAPHYLOCOCCUS AUREUS  Final      Susceptibility   Staphylococcus aureus - MIC*    CIPROFLOXACIN <=0.5 SENSITIVE Sensitive     ERYTHROMYCIN <=0.25 SENSITIVE Sensitive     GENTAMICIN <=0.5 SENSITIVE Sensitive     OXACILLIN 0.5 SENSITIVE Sensitive     TETRACYCLINE >=16 RESISTANT Resistant     VANCOMYCIN 1 SENSITIVE Sensitive     TRIMETH/SULFA <=10 SENSITIVE Sensitive     CLINDAMYCIN <=0.25 SENSITIVE Sensitive     RIFAMPIN <=0.5 SENSITIVE Sensitive     Inducible Clindamycin NEGATIVE Sensitive     * FEW STAPHYLOCOCCUS AUREUS  Aerobic/Anaerobic Culture (surgical/deep wound)     Status: None (Preliminary result)   Collection Time: 04/22/20 12:40 PM   Specimen: Soft Tissue, Other    Result Value Ref Range Status   Specimen Description   Final    KNEE RIGHT TISSUE Performed at [facility] [address] [place], [place] [address]    Special Requests   Final    NONE Performed at [facility], [place] [address] [place], [place] [address]    Gram Stain   Final    FEW WBC PRESENT,BOTH PMN AND MONONUCLEAR FEW GRAM POSITIVE COCCI Performed at [facility], [place] [address], [place], [place] [address]    Culture   Final    ABUNDANT STAPHYLOCOCCUS AUREUS MODERATE GROUP B STREP(S.AGALACTIAE)ISOLATED TESTING AGAINST S. AGALACTIAE NOT ROUTINELY PERFORMED DUE TO PREDICTABILITY OF AMP/PEN/VAN SUSCEPTIBILITY. NO ANAEROBES ISOLATED; CULTURE IN PROGRESS FOR 5 DAYS    Report Status PENDING  Incomplete   Organism ID, Bacteria STAPHYLOCOCCUS AUREUS  Final      Susceptibility   Staphylococcus aureus - MIC*    CIPROFLOXACIN <=0.5 SENSITIVE Sensitive     ERYTHROMYCIN 0.5 SENSITIVE Sensitive     GENTAMICIN <=0.5 SENSITIVE Sensitive     OXACILLIN 0.5 SENSITIVE Sensitive     TETRACYCLINE >=16 RESISTANT Resistant     VANCOMYCIN 1 SENSITIVE Sensitive     TRIMETH/SULFA <=10 SENSITIVE Sensitive     CLINDAMYCIN <=0.25 SENSITIVE Sensitive     RIFAMPIN <=  0.5 SENSITIVE Sensitive     Inducible Clindamycin NEGATIVE Sensitive     * ABUNDANT STAPHYLOCOCCUS AUREUS  Aerobic/Anaerobic Culture (surgical/deep wound)     Status: None (Preliminary result)   Collection Time: 04/22/20 12:40 PM   Specimen: Wound  Result Value Ref Range Status   Specimen Description   Final    KNEE LEFT TISSUE Performed at [place] [address], [place], [place] [address]    Special Requests   Final    NONE Performed at [facility], [place] [address], [place], [place] [address]    Gram Stain   Final    ABUNDANT WBC PRESENT,BOTH PMN AND MONONUCLEAR FEW GRAM POSITIVE COCCI Performed at [facility], [place] [address], [place], [place] [address]    Culture   Final    FEW STAPHYLOCOCCUS AUREUS RARE GROUP B STREP(S.AGALACTIAE)ISOLATED TESTING AGAINST S. AGALACTIAE NOT ROUTINELY PERFORMED DUE TO PREDICTABILITY OF AMP/PEN/VAN SUSCEPTIBILITY. NO ANAEROBES ISOLATED; CULTURE IN PROGRESS FOR 5 DAYS    Report Status PENDING  Incomplete   Organism ID, Bacteria STAPHYLOCOCCUS AUREUS  Final      Susceptibility   Staphylococcus aureus - MIC*    CIPROFLOXACIN <=0.5 SENSITIVE Sensitive     ERYTHROMYCIN <=0.25 SENSITIVE Sensitive     GENTAMICIN <=0.5 SENSITIVE Sensitive     OXACILLIN <=0.25 SENSITIVE Sensitive     TETRACYCLINE >=16 RESISTANT Resistant     VANCOMYCIN 1 SENSITIVE Sensitive     TRIMETH/SULFA <=10 SENSITIVE Sensitive     CLINDAMYCIN <=0.25 SENSITIVE Sensitive     RIFAMPIN <=0.5 SENSITIVE Sensitive     Inducible Clindamycin NEGATIVE Sensitive     * FEW STAPHYLOCOCCUS AUREUS  Acid Fast Smear (AFB)     Status: None   Collection Time: 04/22/20 12:40 PM   Specimen: Soft Tissue, Other  Result Value Ref Range Status   AFB Specimen Processing Comment  Final    Comment: Tissue Grinding and Digestion/Decontamination   Acid Fast Smear Negative  Final    Comment: (NOTE) Performed At: [facility] [place], [place] [id number] Rush Farmer MD [id number]    Source (AFB) RIGHT  Final    Comment: KNEE TISSUE Performed at [facility], [place] [address], [place], [place] [address]   Acid Fast Smear (AFB)     Status: None   Collection Time: 04/22/20 12:40 PM   Specimen: Wound  Result Value Ref Range Status   AFB Specimen Processing Comment  Final    Comment: Tissue Grinding and Digestion/Decontamination   Acid Fast Smear Negative  Final    Comment: (NOTE) Performed At: [facility] [place], [place] [id number] Rush Farmer MD [id number]    Source (AFB) LEFT  Final    Comment: KNEE TISSUE Performed at [facility], [place] [address], [place], [place] [address]   Fungus Culture Result     Status: None   Collection Time: 04/22/20 12:40 PM  Result Value Ref Range Status   Result 1 Comment  Final    Comment: (NOTE) KOH/Calcofluor preparation:  no fungus observed. Performed At: [facility] [place], [place] [id number] Rush Farmer MD [id number]   Fungus Culture Result     Status: None   Collection Time: 04/22/20 12:40 PM  Result Value Ref Range Status   Result 1 Comment  Final    Comment: (NOTE) KOH/Calcofluor preparation:  no fungus observed. Performed At: [facility] [place],  [place] [id number] Rush Farmer MD [id number]   Fungus Culture Result     Status: None   Collection Time: 04/22/20 12:40 PM  Result Value Ref Range Status   Result 1 Comment  Final    Comment: (NOTE) KOH/Calcofluor preparation:  no fungus observed. Performed At: [facility] [place], [place] [id number] Rush Farmer MD [id number]     Thayer Headings, [facility] [email or web address] 04/26/2020, 1:26 PM

## 2020-04-26 NOTE — TOC Progression Note (Signed)
Transition of Care [facility]) - Progression Note    Patient Details  Name: Morgan [name] MRN: [id number] Date of Birth: [birth year]  Transition of Care [facility]) CM/SW [place], [place] Phone Number: 04/26/2020, 9:48 AM  Clinical Narrative:   [place] Setting with [facility] to see if they will be able to service patient with home health services.  Awaiting call back. TOC will continue to follow during the course of hospitalization.     Expected Discharge Plan: [place] Barriers to Discharge: No Barriers Identified  Expected Discharge Plan and Services Expected Discharge Plan: [place]   Discharge Planning Services: CM Consult Post Acute Care Choice: Durable Medical Equipment, Home Health Living arrangements for the past 2 months: Single Family Home                                       Social Determinants of Health (SDOH) Interventions    Readmission Risk Interventions No flowsheet data found.

## 2020-04-27 DIAGNOSIS — M71169 Other infective bursitis, unspecified knee: Secondary | ICD-10-CM

## 2020-04-27 DIAGNOSIS — L03119 Cellulitis of unspecified part of limb: Secondary | ICD-10-CM

## 2020-04-27 DIAGNOSIS — L0390 Cellulitis, unspecified: Secondary | ICD-10-CM

## 2020-04-27 DIAGNOSIS — T8130XA Disruption of wound, unspecified, initial encounter: Secondary | ICD-10-CM | POA: Diagnosis not present

## 2020-04-27 DIAGNOSIS — I10 Essential (primary) hypertension: Secondary | ICD-10-CM | POA: Diagnosis not present

## 2020-04-27 LAB — DIFFERENTIAL
Abs Immature Granulocytes: 1.94 10*3/uL — ABNORMAL HIGH (ref 0.00–0.07)
Basophils Absolute: 0.1 10*3/uL (ref 0.0–0.1)
Basophils Relative: 1 %
Eosinophils Absolute: 0 10*3/uL (ref 0.0–0.5)
Eosinophils Relative: 0 %
Immature Granulocytes: 9 %
Lymphocytes Relative: 10 %
Lymphs Abs: 2 10*3/uL (ref 0.7–4.0)
Monocytes Absolute: 5.7 10*3/uL — ABNORMAL HIGH (ref 0.1–1.0)
Monocytes Relative: 28 %
Neutro Abs: 10.9 10*3/uL — ABNORMAL HIGH (ref 1.7–7.7)
Neutrophils Relative %: 52 %

## 2020-04-27 LAB — CBC
HCT: 29.5 % — ABNORMAL LOW (ref 36.0–46.0)
Hemoglobin: 9.4 g/dL — ABNORMAL LOW (ref 12.0–15.0)
MCH: 30.3 pg (ref 26.0–34.0)
MCHC: 31.9 g/dL (ref 30.0–36.0)
MCV: 95.2 fL (ref 80.0–100.0)
Platelets: 81 10*3/uL — ABNORMAL LOW (ref 150–400)
RBC: 3.1 MIL/uL — ABNORMAL LOW (ref 3.87–5.11)
RDW: 15.7 % — ABNORMAL HIGH (ref 11.5–15.5)
WBC: 20.5 10*3/uL — ABNORMAL HIGH (ref 4.0–10.5)
nRBC: 0.1 % (ref 0.0–0.2)

## 2020-04-27 LAB — AEROBIC/ANAEROBIC CULTURE W GRAM STAIN (SURGICAL/DEEP WOUND)

## 2020-04-27 NOTE — Progress Notes (Signed)
04/27/2020  1520 Per SW patient needs order for wheelchair with elevated leg rest. Notified MD to place order.

## 2020-04-27 NOTE — TOC Progression Note (Signed)
Transition of Care [facility]) - Progression Note    Patient Details  Name: KIMBERLA HASELTINE MRN: [id number] Date of Birth: [birth year]  Transition of Care [facility]) CM/SW [place], [place] Phone Number: 04/27/2020, 11:39 AM  Clinical Narrative:   [facility].  [facility] patient.  Continuing to look.  Addendum:  [facility] called back to [facility] patient.    Expected Discharge Plan: [place] Barriers to Discharge: No Barriers Identified  Expected Discharge Plan and Services Expected Discharge Plan: [place]   Discharge Planning Services: CM Consult Post Acute Care Choice: Durable Medical Equipment, Home Health Living arrangements for the past 2 months: Single Family Home                                       Social Determinants of Health (SDOH) Interventions    Readmission Risk Interventions No flowsheet data found.

## 2020-04-27 NOTE — Progress Notes (Addendum)
Physical Therapy Treatment Patient Details Name: Morgan [name] MRN: [id number] DOB: 1951/11/01 Today's Date: 04/27/2020    History of Present Illness 69 yo female admitted to ED on 5/20 with bilateral knee infections, secondary to fall a few weeks ago. S/p I&D bilateral knees on 5/21 and 5/23, no longer has L knee wound vac on eval 5/24. PMH includes RA, HTN, HLD, skin cancer, venous insufficiency, R shoulder arthroplasty 09/2019.    PT Comments    Patient has been cleared to progress mobility to gait training per Drue Novel, PA. She is no longer in Rt knee immobilizer and per ortho note from 04/26/20 and is safe to initiate gentle ROM on Rt LE. Pt ambulated ~ 30 feet today with slow cadence and cues to dorsiflex Lt ankle to improve foot clearance as she remains in knee immobilizer. Patient began gentle AROM for Rt knee to facilitate extension/flexion with no increased in pain. Pt and her husband are planning to rent a ramp for her to be able to safely enter her home and anticipate it will be delivered Thursday 04/28/20. She will benefit from a wheelchair with elevated leg rests to support her Lt LE as it is fixed in extension in knee immobilizer. This will allow her to safely discharge home with assist from her husband. She reports she does have 24/7 assist from him. Acute PT will continue to follow and progress patient as able and continue to educated on current precautions/restrictions.    Follow Up Recommendations  Home health PT;Supervision/Assistance - 24 hour     Equipment Recommendations  Rolling walker with 5" wheels;3in1 (PT);Wheelchair (measurements PT);Wheelchair cushion (measurements PT)    Recommendations for Other Services       Precautions / Restrictions Precautions Precautions: Fall Precaution Comments: per Ortho note on 5/25 "Right lower extremity gentle ROM as tolerated, left knee continue in immobilizer, WBAT" Required Braces or Orthoses: Knee Immobilizer -  Left;Knee Immobilizer - Right Knee Immobilizer - Right: (discontinued on 5/25 by MD) Knee Immobilizer - Left: On at all times Restrictions Weight Bearing Restrictions: No RLE Weight Bearing: Weight bearing as tolerated LLE Weight Bearing: Weight bearing as tolerated Other Position/Activity Restrictions: Per secure chat with Drue Novel, PA, pt is able to progress to gait training.(safe to perform gentle ROM on Rt LE per ortho note 04/26/20)    Mobility  Bed Mobility Overal bed mobility: Needs Assistance Bed Mobility: Supine to Sit     Supine to sit: HOB elevated;Min guard     General bed mobility comments: cues for use of gait belt to assist with Lt LE mobility and pt using bed rail. guarding for safety.   Transfers Overall transfer level: Needs assistance Equipment used: Rolling walker (2 wheeled) Transfers: Sit to/from Stand Sit to Stand: Min assist;From elevated surface         General transfer comment: pt no longer in immobilizer on Rt LE and able to use for power up. Pt able to initiate power up from low EOB with bil UE use. assist to steady with rising.   Ambulation/Gait Ambulation/Gait assistance: Min guard;Min assist;+2 safety/equipment(+2 for chair follow) Gait Distance (Feet): 30 Feet Assistive device: Rolling walker (2 wheeled) Gait Pattern/deviations: Step-to pattern;Decreased step length - right;Decreased step length - left;Decreased stride length Gait details: cues for safe step pattern in RW and to dorsiflex Lt ankle with step through to improve foot clearance during swing. pt with no overt LOB, easily fatigued. Gait velocity: decreased   General Gait Details: not  progressed to gait today, waiting on clarification from surgeons on when pt is safe to progress.   Stairs             Wheelchair Mobility    Modified Rankin (Stroke Patients Only)       Balance Overall balance assessment: Needs assistance;History of Falls Sitting-balance support:  No upper extremity supported Sitting balance-Leahy Scale: Fair     Standing balance support: Bilateral upper extremity supported Standing balance-Leahy Scale: Poor Standing balance comment: reliant on external support             Cognition Arousal/Alertness: Awake/alert Behavior During Therapy: WFL for tasks assessed/performed Overall Cognitive Status: Within Functional Limits for tasks assessed             Exercises Total Joint Exercises Ankle Circles/Pumps: AROM;Both;20 reps;Seated Quad Sets: Right;10 reps;Seated;AROM Heel Slides: AROM;Right;10 reps;Seated General Exercises - Lower Extremity Ankle Circles/Pumps: AROM;Both;20 reps;Seated    General Comments        Pertinent Vitals/Pain Pain Assessment: 0-10 Pain Score: 6  Pain Location: L knee Pain Descriptors / Indicators: Sore;Discomfort;Grimacing;Guarding Pain Intervention(s): Limited activity within patient's tolerance;Monitored during session;Repositioned;Ice applied           PT Goals (current goals can now be found in the care plan section) Acute Rehab PT Goals Patient Stated Goal: go home PT Goal Formulation: With patient Time For Goal Achievement: 05/09/20 Potential to Achieve Goals: Good Progress towards PT goals: Progressing toward goals    Frequency    Min 5X/week      PT Plan Current plan remains appropriate       AM-PAC PT "6 Clicks" Mobility   Outcome Measure  Help needed turning from your back to your side while in a flat bed without using bedrails?: A Little Help needed moving from lying on your back to sitting on the side of a flat bed without using bedrails?: A Little Help needed moving to and from a bed to a chair (including a wheelchair)?: A Little Help needed standing up from a chair using your arms (e.g., wheelchair or bedside chair)?: A Little Help needed to walk in hospital room?: A Little Help needed climbing 3-5 steps with a railing? : A Lot 6 Click Score: 17     End of Session Equipment Utilized During Treatment: Gait belt;Left knee immobilizer Activity Tolerance: Patient tolerated treatment well Patient left: in chair;with chair alarm set;with call bell/phone within reach Nurse Communication: Mobility status PT Visit Diagnosis: Other abnormalities of gait and mobility (R26.89);Muscle weakness (generalized) (M62.81);Difficulty in walking, not elsewhere classified (R26.2)     Time: [id number] PT Time Calculation (min) (ACUTE ONLY): 31 min  Charges:  $Gait Training: 8-22 mins $Therapeutic Exercise: 8-22 mins                     Verner Mould, DPT Physical Therapist with [facility] [phone]  04/27/2020 12:36 PM

## 2020-04-27 NOTE — Plan of Care (Signed)
  Problem: Activity: Goal: Risk for activity intolerance will decrease Outcome: Progressing   Problem: Nutrition: Goal: Adequate nutrition will be maintained Outcome: Progressing   Problem: Elimination: Goal: Will not experience complications related to bowel motility Outcome: Progressing   Problem: Pain Managment: Goal: General experience of comfort will improve Outcome: Progressing   

## 2020-04-27 NOTE — Progress Notes (Signed)
PROGRESS NOTE    Morgan [name]  [id number] DOB: 1951/04/24 DOA: 04/21/2020 PCP: [name], MD   Brief Narrative: Morgan [name] is a 69 y.o. female with a history of HTN, HLD, RA, and psoriatic arthritis. She presented secondary to bilateral knee wounds from her orthopedic surgeon in setting of chronic septic bursitis. Patient was treated with empiric antibiotics and underwent I&D in the OR by orthopedic surgery. Cultures significant for MSSA.   Assessment & Plan:   Principal Problem:   Septic prepatellar bursitis Active Problems:   Mixed hyperlipidemia   Essential hypertension   Wound dehiscence   Cellulitis   Cellulitis Bilateral knee wounds Septic prepatellar bursitis Patient underwent I&D in the operating room. Wound cultures significant for MSSA bilaterally in addition to Prevotella on left and Group B strep on right wound cultures . Patient was initially empirically treated with Vancomycin/Zosyn, transitioned to Cefazolin and is now transtioned to Unasyn. Recommendations for a total of 3 weeks of antibiotics. Patient receiving daily dressing changes per orthopedic surgery recommendations. WBAT per orthopedic surgery recommendations. -Orthopedic surgery recommendations: okay for discharge. Follow-up in 1 week -PT recommendations: home health PT, wheelchair, rolling walker, 3 in 1 commode -ID recommendations: Augmentin x 3 weeks total antibiotic therapy; outpatient ID follow-up -Continue Unasyn; transition to Augmentin tomorrow  Hypokalemia Resolved  Normocytic anemia Baseline around 11. Down to a low of 8.9 post surgery.   Essential hypertension Patient is on hydrochlorothiazide as an outpatient which was held. Blood pressure mildly uncontrolled.  Leukocytosis Patient has a history of persistent leukocytosis. Differential shows reduction in neutrophils from admission. Monocytosis present which is consistent with history.  Rheumatoid arthritis Psoriatic  arthritis Patient is on prednisone, Plaquenil and Cosentyx as an outpatient. Plaquenil held on admission secondary to infection. Consentyx given as a monthly injection outpatient. -Continue prednisone  Hyperlipidemia -Continue Simvastatin   DVT prophylaxis: Lovenox Code Status:   Code Status: Full Code Family Communication: None at bedside Disposition Plan: Discharge home tomorrow, 5/27. Not a safe discharge as home equipment not available until 5/27. Patient with physical limitations that prevent her from being able to safely navigate stairs.   Consultants:   Orthopedic surgery  Infectious disease  Procedures:   EXCISIONAL DEBRIDEMENT OF SKIN AND SUBCUTANEOUS TISSUE BILATERAL KNEES/WOUND VAC PLACEMENT (04/22/2020)  SECOND LOOK IRRIGATION DEBRIDEMENT(04/24/2020)  Antimicrobials:  Vancomycin  Zosyn  Cefazolin  Unasyn    Subjective: Knees feel better. Poor ambulation secondary to immobilizer and pain.  Objective: Vitals:   04/26/20 1511 04/26/20 2123 04/27/20 0424 04/27/20 0500  BP: (!) 142/57 (!) 142/56 132/60   Pulse: 79 80 70   Resp: 16 17 17    Temp: 97.6 F (36.4 C) 98.1 F (36.7 C) 97.8 F (36.6 C)   TempSrc: Oral Oral    SpO2: 100% 100% 97%   Weight:    102.1 kg  Height:        Intake/Output Summary (Last 24 hours) at 04/27/2020 1247 Last data filed at 04/27/2020 0700 Gross per 24 hour  Intake 1178.69 ml  Output 200 ml  Net 978.69 ml   Filed Weights   04/25/20 0530 04/26/20 0500 04/27/20 0500  Weight: 101.8 kg 103.4 kg 102.1 kg    Examination:  General exam: Appears calm and comfortable Respiratory system: Clear to auscultation. Respiratory effort normal. Cardiovascular system: S1 & S2 heard, RRR. No murmurs, rubs, gallops or clicks. Gastrointestinal system: Abdomen is nondistended, soft and nontender. No organomegaly or masses felt. Normal bowel sounds heard. Central nervous system:  Alert and oriented. No focal neurological  deficits. Musculoskeletal: Left leg in knee immobilizer. Right leg wrapped in ACE bandage Skin: No cyanosis. No rashes Psychiatry: Judgement and insight appear normal. Mood & affect appropriate.     Data Reviewed: I have personally reviewed following labs and imaging studies  CBC    Component Value Date/Time   WBC 20.5 (H) 04/27/2020 0533   RBC 3.10 (L) 04/27/2020 0533   HGB 9.4 (L) 04/27/2020 0533   HGB 13.8 08/26/2019 0902   HGB 13.7 06/28/2017 0851   HCT 29.5 (L) 04/27/2020 0533   HCT 40.9 06/28/2017 0851   PLT 81 (L) 04/27/2020 0533   PLT 146 (L) 08/26/2019 0902   PLT 112 (L) 06/28/2017 0851   MCV 95.2 04/27/2020 0533   MCV 89.3 06/28/2017 0851   MCH 30.3 04/27/2020 0533   MCHC 31.9 04/27/2020 0533   RDW 15.7 (H) 04/27/2020 0533   RDW 14.9 (H) 06/28/2017 0851   LYMPHSABS 2.0 04/27/2020 0533   LYMPHSABS 2.7 06/28/2017 0851   MONOABS 5.7 (H) 04/27/2020 0533   MONOABS 2.6 (H) 06/28/2017 0851   EOSABS 0.0 04/27/2020 0533   EOSABS 0.0 06/28/2017 0851   BASOSABS 0.1 04/27/2020 0533   BASOSABS 0.1 06/28/2017 0851     CMP     Component Value Date/Time   NA 141 04/26/2020 0555   NA 139 06/28/2017 0851   K 3.8 04/26/2020 0555   K 3.8 06/28/2017 0851   CL 107 04/26/2020 0555   CO2 28 04/26/2020 0555   CO2 28 06/28/2017 0851   GLUCOSE 101 (H) 04/26/2020 0555   GLUCOSE 94 06/28/2017 0851   BUN 23 04/26/2020 0555   BUN 21.4 06/28/2017 0851   CREATININE 0.78 04/26/2020 0555   CREATININE 1.00 08/26/2019 0902   CREATININE 1.0 06/28/2017 0851   CALCIUM 8.6 (L) 04/26/2020 0555   CALCIUM 10.8 (H) 06/28/2017 0851   PROT 6.6 04/22/2020 0602   PROT 7.9 06/28/2017 0851   ALBUMIN 3.9 04/22/2020 0602   ALBUMIN 4.2 06/28/2017 0851   AST 30 04/22/2020 0602   AST 37 08/26/2019 0902   AST 29 06/28/2017 0851   ALT 22 04/22/2020 0602   ALT 31 08/26/2019 0902   ALT 19 06/28/2017 0851   ALKPHOS 39 04/22/2020 0602   ALKPHOS 49 06/28/2017 0851   BILITOT 0.8 04/22/2020 0602    BILITOT 0.5 08/26/2019 0902   BILITOT 0.68 06/28/2017 0851   GFRNONAA >60 04/26/2020 0555   GFRNONAA 57 (L) 08/26/2019 0902   GFRNONAA 61 11/18/2013 0813   GFRAA >60 04/26/2020 0555   GFRAA >60 08/26/2019 0902   GFRAA 71 11/18/2013 0813    CBG (last 3)  No results for input(s): GLUCAP in the last 72 hours.   GFR: Estimated Creatinine Clearance: 75 mL/min (by C-G formula based on SCr of 0.78 mg/dL).  Estimated Creatinine Clearance: 75 mL/min (by C-G formula based on SCr of 0.78 mg/dL).  Liver Function Tests: Recent Labs  Lab 04/21/20 1443 04/22/20 0602  AST 31 30  ALT 22 22  ALKPHOS 53 39  BILITOT 0.7 0.8  PROT 7.3 6.6  ALBUMIN 4.2 3.9   No results for input(s): LIPASE, AMYLASE in the last 168 hours. No results for input(s): AMMONIA in the last 168 hours.  Coagulation Profile: Recent Labs  Lab 04/21/20 1443  INR 1.1    Recent Results (from the past 240 hour(s))  SARS Coronavirus 2 by RT PCR (hospital order, performed in [facility] hospital lab) Nasopharyngeal Nasopharyngeal Swab  Status: None   Collection Time: 04/21/20  2:43 PM   Specimen: Nasopharyngeal Swab  Result Value Ref Range Status   SARS Coronavirus 2 NEGATIVE NEGATIVE Final    Comment: (NOTE) SARS-CoV-2 target nucleic acids are NOT DETECTED. The SARS-CoV-2 RNA is generally detectable in upper and lower respiratory specimens during the acute phase of infection. The lowest concentration of SARS-CoV-2 viral copies this assay can detect is 250 copies / mL. A negative result does not preclude SARS-CoV-2 infection and should not be used as the sole basis for treatment or other patient management decisions.  A negative result may occur with improper specimen collection / handling, submission of specimen other than nasopharyngeal swab, presence of viral mutation(s) within the areas targeted by this assay, and inadequate number of viral copies (<250 copies / mL). A negative result must be combined with  clinical observations, patient history, and epidemiological information. Fact Sheet for Patients:   StrictlyIdeas.no Fact Sheet for Healthcare Providers: BankingDealers.co.za This test is not yet approved or cleared  by the [place] FDA and has been authorized for detection and/or diagnosis of SARS-CoV-2 by FDA under an Emergency Use Authorization (EUA).  This EUA will remain in effect (meaning this test can be used) for the duration of the COVID-19 declaration under Section 564(b)(1) of the Act, 21 U.S.C. section 360bbb-3(b)(1), unless the authorization is terminated or revoked sooner. Performed at [facility], [place] [address], [place], [place] [address]   Surgical PCR screen     Status: Abnormal   Collection Time: 04/22/20  5:35 AM   Specimen: Nasal Mucosa; Nasal Swab  Result Value Ref Range Status   MRSA, PCR NEGATIVE NEGATIVE Final   Staphylococcus aureus POSITIVE (A) NEGATIVE Final    Comment: (NOTE) The Xpert SA Assay (FDA approved for NASAL specimens in patients 84 years of age and older), is one component of a comprehensive surveillance program. It is not intended to diagnose infection nor to guide or monitor treatment. Performed at [facility], [place] [address], [place], [place] [address]   Fungus Culture With Stain     Status: None (Preliminary result)   Collection Time: 04/22/20 12:40 PM   Specimen: Wound  Result Value Ref Range Status   Fungus Stain Final report  Final    Comment: (NOTE) Performed At: [facility] [place], [place] [id number] Rush Farmer MD [id number]    Fungus (Mycology) Culture PENDING  Incomplete   Fungal Source RIGHT  Final    Comment: KNEE FLUID Performed at [facility], [place] [address], [place], [place] [address]   Fungus Culture With Stain     Status: None (Preliminary result)   Collection Time:  04/22/20 12:40 PM   Specimen: Soft Tissue, Other  Result Value Ref Range Status   Fungus Stain Final report  Final    Comment: (NOTE) Performed At: [facility] [place], [place] [id number] Rush Farmer MD [id number]    Fungus (Mycology) Culture PENDING  Incomplete   Fungal Source RIGHT  Final    Comment: KNEE TISSUE Performed at [facility], [place] [address], [place], [place] [address]   Fungus Culture With Stain     Status: None (Preliminary result)   Collection Time: 04/22/20 12:40 PM   Specimen: Wound  Result Value Ref Range Status   Fungus Stain Final report  Final    Comment: (NOTE) Performed At: [facility] [address] [place], [place] [id number] Perlie Gold  Derinda Late MD [id number]    Fungus (Mycology) Culture PENDING  Incomplete   Fungal Source LEFT  Final    Comment: KNEE TISSUE Performed at [place] [address] [place], [place] [address]   Aerobic/Anaerobic Culture (surgical/deep wound)     Status: None   Collection Time: 04/22/20 12:40 PM   Specimen: Soft Tissue, Other  Result Value Ref Range Status   Specimen Description   Final    KNEE LEFT FLUID Performed at [place] [address], [place], [place] [address]    Special Requests   Final    NONE Performed at [facility], [place] [address] [place], [place] [address]    Gram Stain   Final    MODERATE WBC PRESENT,BOTH PMN AND MONONUCLEAR FEW GRAM POSITIVE COCCI    Culture   Final    FEW STAPHYLOCOCCUS AUREUS RARE PREVOTELLA MELANINOGENICA BETA LACTAMASE POSITIVE Performed at [facility], [place] [address], [place], [place] [address]    Report Status 04/25/2020 FINAL  Final   Organism ID, Bacteria STAPHYLOCOCCUS AUREUS  Final      Susceptibility   Staphylococcus aureus - MIC*    CIPROFLOXACIN <=0.5 SENSITIVE Sensitive     ERYTHROMYCIN <=0.25 SENSITIVE Sensitive      GENTAMICIN <=0.5 SENSITIVE Sensitive     OXACILLIN 0.5 SENSITIVE Sensitive     TETRACYCLINE >=16 RESISTANT Resistant     VANCOMYCIN 1 SENSITIVE Sensitive     TRIMETH/SULFA <=10 SENSITIVE Sensitive     CLINDAMYCIN <=0.25 SENSITIVE Sensitive     RIFAMPIN <=0.5 SENSITIVE Sensitive     Inducible Clindamycin NEGATIVE Sensitive     * FEW STAPHYLOCOCCUS AUREUS  Aerobic/Anaerobic Culture (surgical/deep wound)     Status: None   Collection Time: 04/22/20 12:40 PM   Specimen: Wound  Result Value Ref Range Status   Specimen Description   Final    KNEE RIGHT FLUID Performed at [place] [address], [place], [place] [address]    Special Requests   Final    NONE Performed at [facility], [place] [address], [place], [place] [address]    Gram Stain   Final    ABUNDANT WBC PRESENT,BOTH PMN AND MONONUCLEAR NO ORGANISMS SEEN    Culture   Final    FEW STAPHYLOCOCCUS AUREUS RARE GROUP B STREP(S.AGALACTIAE)ISOLATED TESTING AGAINST S. AGALACTIAE NOT ROUTINELY PERFORMED DUE TO PREDICTABILITY OF AMP/PEN/VAN SUSCEPTIBILITY. NO ANAEROBES ISOLATED Performed at [facility], [place] [address] [place], [place] [address]    Report Status 04/27/2020 FINAL  Final   Organism ID, Bacteria STAPHYLOCOCCUS AUREUS  Final      Susceptibility   Staphylococcus aureus - MIC*    CIPROFLOXACIN <=0.5 SENSITIVE Sensitive     ERYTHROMYCIN <=0.25 SENSITIVE Sensitive     GENTAMICIN <=0.5 SENSITIVE Sensitive     OXACILLIN 0.5 SENSITIVE Sensitive     TETRACYCLINE >=16 RESISTANT Resistant     VANCOMYCIN 1 SENSITIVE Sensitive     TRIMETH/SULFA <=10 SENSITIVE Sensitive     CLINDAMYCIN <=0.25 SENSITIVE Sensitive     RIFAMPIN <=0.5 SENSITIVE Sensitive     Inducible Clindamycin NEGATIVE Sensitive     * FEW STAPHYLOCOCCUS AUREUS  Aerobic/Anaerobic Culture (surgical/deep wound)     Status: None   Collection Time: 04/22/20 12:40 PM   Specimen: Soft Tissue, Other  Result Value Ref  Range Status   Specimen Description   Final    KNEE RIGHT TISSUE Performed at [facility]  [place] [address], [place], [place] [address]    Special Requests   Final    NONE Performed at [facility], [place] [address], [place], [place] [address]    Gram Stain   Final    FEW WBC PRESENT,BOTH PMN AND MONONUCLEAR FEW GRAM POSITIVE COCCI    Culture   Final    ABUNDANT STAPHYLOCOCCUS AUREUS MODERATE GROUP B STREP(S.AGALACTIAE)ISOLATED TESTING AGAINST S. AGALACTIAE NOT ROUTINELY PERFORMED DUE TO PREDICTABILITY OF AMP/PEN/VAN SUSCEPTIBILITY. NO ANAEROBES ISOLATED Performed at [facility], [place] [address], [place], [place] [address]    Report Status 04/27/2020 FINAL  Final   Organism ID, Bacteria STAPHYLOCOCCUS AUREUS  Final      Susceptibility   Staphylococcus aureus - MIC*    CIPROFLOXACIN <=0.5 SENSITIVE Sensitive     ERYTHROMYCIN 0.5 SENSITIVE Sensitive     GENTAMICIN <=0.5 SENSITIVE Sensitive     OXACILLIN 0.5 SENSITIVE Sensitive     TETRACYCLINE >=16 RESISTANT Resistant     VANCOMYCIN 1 SENSITIVE Sensitive     TRIMETH/SULFA <=10 SENSITIVE Sensitive     CLINDAMYCIN <=0.25 SENSITIVE Sensitive     RIFAMPIN <=0.5 SENSITIVE Sensitive     Inducible Clindamycin NEGATIVE Sensitive     * ABUNDANT STAPHYLOCOCCUS AUREUS  Aerobic/Anaerobic Culture (surgical/deep wound)     Status: None   Collection Time: 04/22/20 12:40 PM   Specimen: Wound  Result Value Ref Range Status   Specimen Description   Final    KNEE LEFT TISSUE Performed at [facility] [address], [place], [place] [address]    Special Requests   Final    NONE Performed at [facility], [place] [address] [place], [place] [address]    Gram Stain   Final    ABUNDANT WBC PRESENT,BOTH PMN AND MONONUCLEAR FEW GRAM POSITIVE COCCI    Culture   Final    FEW STAPHYLOCOCCUS AUREUS RARE GROUP B STREP(S.AGALACTIAE)ISOLATED TESTING AGAINST S.  AGALACTIAE NOT ROUTINELY PERFORMED DUE TO PREDICTABILITY OF AMP/PEN/VAN SUSCEPTIBILITY. NO ANAEROBES ISOLATED Performed at [facility], [place] [address] [place], [place] [address]    Report Status 04/27/2020 FINAL  Final   Organism ID, Bacteria STAPHYLOCOCCUS AUREUS  Final      Susceptibility   Staphylococcus aureus - MIC*    CIPROFLOXACIN <=0.5 SENSITIVE Sensitive     ERYTHROMYCIN <=0.25 SENSITIVE Sensitive     GENTAMICIN <=0.5 SENSITIVE Sensitive     OXACILLIN <=0.25 SENSITIVE Sensitive     TETRACYCLINE >=16 RESISTANT Resistant     VANCOMYCIN 1 SENSITIVE Sensitive     TRIMETH/SULFA <=10 SENSITIVE Sensitive     CLINDAMYCIN <=0.25 SENSITIVE Sensitive     RIFAMPIN <=0.5 SENSITIVE Sensitive     Inducible Clindamycin NEGATIVE Sensitive     * FEW STAPHYLOCOCCUS AUREUS  Acid Fast Smear (AFB)     Status: None   Collection Time: 04/22/20 12:40 PM   Specimen: Soft Tissue, Other  Result Value Ref Range Status   AFB Specimen Processing Comment  Final    Comment: Tissue Grinding and Digestion/Decontamination   Acid Fast Smear Negative  Final    Comment: (NOTE) Performed At: [facility] [place], [place] [id number] Rush Farmer MD [id number]    Source (AFB) RIGHT  Final    Comment: KNEE TISSUE Performed at [facility], [place] [address] [place], [place] [address]   Acid Fast Smear (AFB)     Status: None   Collection Time:  04/22/20 12:40 PM   Specimen: Wound  Result Value Ref Range Status   AFB Specimen Processing Comment  Final    Comment: Tissue Grinding and Digestion/Decontamination   Acid Fast Smear Negative  Final    Comment: (NOTE) Performed At: [facility] [place], [place] [id number] Rush Farmer MD [id number]    Source (AFB) LEFT  Final    Comment: KNEE TISSUE Performed at [facility], [place] [address] [place], [place] [address]   Fungus Culture Result     Status: None    Collection Time: 04/22/20 12:40 PM  Result Value Ref Range Status   Result 1 Comment  Final    Comment: (NOTE) KOH/Calcofluor preparation:  no fungus observed. Performed At: [facility] [place], [place] [id number] Rush Farmer MD [id number]   Fungus Culture Result     Status: None   Collection Time: 04/22/20 12:40 PM  Result Value Ref Range Status   Result 1 Comment  Final    Comment: (NOTE) KOH/Calcofluor preparation:  no fungus observed. Performed At: [facility] [place], [place] [id number] Rush Farmer MD [id number]   Fungus Culture Result     Status: None   Collection Time: 04/22/20 12:40 PM  Result Value Ref Range Status   Result 1 Comment  Final    Comment: (NOTE) KOH/Calcofluor preparation:  no fungus observed. Performed At: [facility] [place], [place] [id number] Rush Farmer MD [id number]         Radiology Studies: No results found.      Scheduled Meds: . aspirin EC  81 mg Oral Once per day on Mon Wed Fri  . calcium-vitamin D  1 tablet Oral BID  . cholecalciferol  2,000 Units Oral Daily  . docusate sodium  100 mg Oral BID  . doxepin  10 mg Oral BID  . enoxaparin (LOVENOX) injection  40 mg Subcutaneous Q24H  . fenofibrate  160 mg Oral QHS  . multivitamin with minerals  1 tablet Oral Daily  . predniSONE  5 mg Oral Daily  . simvastatin  40 mg Oral QPM   Continuous Infusions: . sodium chloride Stopped (04/26/20 1546)  . ampicillin-sulbactam (UNASYN) IV 3 g (04/27/20 1122)     LOS: 6 days     Cordelia Poche, MD [facility] 04/27/2020, 12:47 PM  If 7PM-7AM, please contact night-coverage [email or web address]

## 2020-04-27 NOTE — Progress Notes (Signed)
Patient ID: Morgan [name], female   DOB: 01-Jun-1951, 69 y.o.   MRN: [id number]  Chart reviewed, discussed with patient yesterday and is okay to go home with home health. Continue same PT orders that were modified yesterday and continued daily dressing changes. Would recommend PO antibiotics x 2 weeks. Follow up in our office in 1 week, [facility] with Dr. Theda Sers.   Encouraged and answered questions with patient yesterday.

## 2020-04-27 NOTE — Progress Notes (Signed)
DME Justification Note  Patient Details Name: Morgan [name] MRN: [id number] DOB: [birth year]   Lightweight Wheelchair Justification  [name] is 69 y.o. female admitted on 5/20 with bilateral knee infections, secondary to fall a few weeks ago. She is now s/p I&D bilateral knees on 5/21 and 5/23. PMH includes RA, HTN, HLD, skin cancer, venous insufficiency, R shoulder arthroplasty 09/2019. [name]. Currently is limited by pain in bil knees, history of falls, LE weakness, and requires Lt knee immobilizer to maintain knee extension and prevent flexion. Her current restrictions impair her ability to perform daily activities like ambulate long distances and negotiate stairs to enter her home.  A walker alone will not resolve the issues with performing activities of daily living and functional mobility. A wheelchair will allow patient to safely perform daily activities. Due to Lt LE precautions the patient will require a wheelchair with elevating leg rests to support the Lt LE and knee brace. The patient can self propel in the home or has a caregiver who can provide assistance.       Verner Mould, DPT Physical Therapist with [facility] [phone]  04/27/2020 12:38 PM

## 2020-04-28 ENCOUNTER — Other Ambulatory Visit: Payer: BC Managed Care – PPO

## 2020-04-28 ENCOUNTER — Ambulatory Visit: Payer: BC Managed Care – PPO | Admitting: Oncology

## 2020-04-28 MED ORDER — HYDROXYCHLOROQUINE SULFATE 200 MG PO TABS: ORAL_TABLET | ORAL | Status: DC

## 2020-04-28 MED ORDER — SENNA 8.6 MG PO TABS
1.0000 | ORAL_TABLET | Freq: Every day | ORAL | 0 refills | Status: DC
Start: 2020-04-28 — End: 2020-05-31

## 2020-04-28 MED ORDER — OXYCODONE HCL 5 MG PO TABS: 5.0000 mg | ORAL_TABLET | ORAL | 0 refills | Status: AC | PRN

## 2020-04-28 MED ORDER — ASPIRIN EC 325 MG PO TBEC
325.0000 mg | DELAYED_RELEASE_TABLET | Freq: Every day | ORAL | 0 refills | Status: AC
Start: 2020-04-28 — End: 2020-05-19

## 2020-04-28 MED ORDER — POLYETHYLENE GLYCOL 3350 17 G PO PACK: 17.0000 g | PACK | Freq: Every day | ORAL | 0 refills | Status: AC

## 2020-04-28 MED ORDER — METHOCARBAMOL 500 MG PO TABS: 500.0000 mg | ORAL_TABLET | Freq: Four times a day (QID) | ORAL | 0 refills | Status: DC

## 2020-04-28 MED ORDER — AMOXICILLIN-POT CLAVULANATE 875-125 MG PO TABS: 1.0000 | ORAL_TABLET | Freq: Two times a day (BID) | ORAL | 0 refills | Status: AC

## 2020-04-28 NOTE — Progress Notes (Signed)
Pt is being discharged home. Discharge instructions including medications and follow up appointments given. Pt had no further questions at this time. 

## 2020-04-28 NOTE — Progress Notes (Signed)
Physical Therapy Treatment Patient Details Name: Morgan [name] MRN: [id number] DOB: 1951/09/23 Today's Date: 04/28/2020    History of Present Illness 69 yo female admitted to ED on 5/20 with bilateral knee infections, secondary to fall a few weeks ago. S/p I&D bilateral knees on 5/21 and 5/23, no longer has L knee wound vac on eval 5/24. PMH includes RA, HTN, HLD, skin cancer, venous insufficiency, R shoulder arthroplasty 09/2019.    PT Comments    Patient making good progress with acute PT and demonstrated good carryover for safe technique with sit<>stand transfers and gait using RW. Patient husband present for therapy and educated on safe guarding technique for transfers and gait and demonstrated good awareness/safety. Pt/spouse instructed on immobilizer management for positioning and securing with straps, educated on gentle ROM exercises for Rt knee, and on RW set up. All questions answered this session. Acute PT will continue to follow and progress as able in preparation for safe discharge home.    Follow Up Recommendations  Home health PT;Supervision/Assistance - 24 hour     Equipment Recommendations  Rolling walker with 5" wheels;3in1 (PT);Wheelchair (measurements PT);Wheelchair cushion (measurements PT)    Recommendations for Other Services       Precautions / Restrictions Precautions Precautions: Fall Precaution Comments: per Ortho note on 5/25 "Right lower extremity gentle ROM as tolerated, left knee continue in immobilizer, WBAT" Required Braces or Orthoses: Knee Immobilizer - Left;Knee Immobilizer - Right Knee Immobilizer - Right: (discontinued on 5/25 by MD) Knee Immobilizer - Left: On at all times Restrictions Weight Bearing Restrictions: No RLE Weight Bearing: Weight bearing as tolerated LLE Weight Bearing: Weight bearing as tolerated Other Position/Activity Restrictions: Per secure chat with Drue Novel, PA, pt is able to progress to gait training.(safe to  perform gentle ROM on Rt LE per ortho note 04/26/20)    Mobility  Bed Mobility Overal bed mobility: Needs Assistance Bed Mobility: Supine to Sit     Supine to sit: HOB elevated;Supervision     General bed mobility comments: pt using bed rail to pivot to EOB, no assist required for LE's and pt did not use belt this date.   Transfers Overall transfer level: Needs assistance Equipment used: Rolling walker (2 wheeled) Transfers: Sit to/from Stand Sit to Stand: Min guard;Min assist      General transfer comment: light min assist/guarding for safety with hand transition from EOB to RW. pt able to complete power up without assist. pt's husband present and educated on safe guarding position.   Ambulation/Gait Ambulation/Gait assistance: Min guard;+2 safety/equipment(+2 for chair follow)   Assistive device: Rolling walker (2 wheeled) Gait Pattern/deviations: Step-to pattern;Decreased step length - right;Decreased step length - left;Decreased stride length Gait velocity: decreased   General Gait Details: pt with good carryover for safe proximity and safe step pattern with RW. Pt's husband present and educated on safe guarding position during gait. cues for dorsiflexion on Lt LE to improve foot clearance and pt's husband educated on observing when pt mobilizing at home.   Stairs      Wheelchair Mobility    Modified Rankin (Stroke Patients Only)       Balance Overall balance assessment: Needs assistance;History of Falls Sitting-balance support: No upper extremity supported Sitting balance-Leahy Scale: Fair     Standing balance support: Bilateral upper extremity supported Standing balance-Leahy Scale: Poor Standing balance comment: reliant on external support           Cognition Arousal/Alertness: Awake/alert Behavior During Therapy: WFL for tasks assessed/performed  Overall Cognitive Status: Within Functional Limits for tasks assessed           Exercises Total Joint  Exercises Ankle Circles/Pumps: AROM;Both;20 reps;Seated Quad Sets: Right;10 reps;Seated;AROM Heel Slides: AROM;Right;10 reps;Seated    General Comments        Pertinent Vitals/Pain Pain Assessment: Faces Faces Pain Scale: Hurts a little bit Pain Location: L knee Pain Descriptors / Indicators: Sore;Discomfort;Grimacing;Guarding Pain Intervention(s): Limited activity within patient's tolerance;Monitored during session;Repositioned           PT Goals (current goals can now be found in the care plan section) Acute Rehab PT Goals Patient Stated Goal: go home PT Goal Formulation: With patient Time For Goal Achievement: 05/09/20 Potential to Achieve Goals: Good Progress towards PT goals: Progressing toward goals    Frequency    Min 5X/week      PT Plan Current plan remains appropriate       AM-PAC PT "6 Clicks" Mobility   Outcome Measure  Help needed turning from your back to your side while in a flat bed without using bedrails?: A Little Help needed moving from lying on your back to sitting on the side of a flat bed without using bedrails?: A Little Help needed moving to and from a bed to a chair (including a wheelchair)?: A Little Help needed standing up from a chair using your arms (e.g., wheelchair or bedside chair)?: A Little Help needed to walk in hospital room?: A Little Help needed climbing 3-5 steps with a railing? : A Lot 6 Click Score: 17    End of Session Equipment Utilized During Treatment: Gait belt;Left knee immobilizer Activity Tolerance: Patient tolerated treatment well Patient left: in chair;with chair alarm set;with call bell/phone within reach;with family/visitor present Nurse Communication: Mobility status PT Visit Diagnosis: Other abnormalities of gait and mobility (R26.89);Muscle weakness (generalized) (M62.81);Difficulty in walking, not elsewhere classified (R26.2)     Time: [id number] PT Time Calculation (min) (ACUTE ONLY): 32  min  Charges:  $Gait Training: 8-22 mins $Therapeutic Exercise: 8-22 mins                     Verner Mould, DPT Physical Therapist with [facility] [phone]  04/28/2020 3:17 PM

## 2020-04-28 NOTE — Discharge Instructions (Signed)
Morgan [name],  You were in the hospital with bilateral knee infections. This was treated with surgery and you will be on antibiotics for another two weeks. Please follow-up with the orthopedic surgeon and infectious disease physician.

## 2020-04-28 NOTE — Progress Notes (Signed)
Patient ID: Morgan [name], female   DOB: [birth year], 69 y.o.   MRN: [id number] Chart reviewed this afternoon: D/C home: will send in oxycodone prn pain, and robaxin prn muscle spasms.  Patient will need to start Aspirin 325 mg tabs twice daily for DVT prophylaxis.   Will follow up in the office in 2 weeks.

## 2020-04-28 NOTE — Discharge Summary (Signed)
Physician Discharge Summary  [name] [id number] DOB: [birth year] DOA: 04/21/2020  PCP: [name], MD  Admit date: 04/21/2020 Discharge date: 04/28/2020  Admitted From: Home Disposition: Home  Recommendations for Outpatient Follow-up:  1. Follow up with PCP in 1 week 2. Follow up with orthopedic surgery 3. Follow up with Infectious disease 4. Aspirin 325 mg BID x3 weeks per orthopedic surgery 5. Please follow up on the following pending results: None  Home Health: PT/OT Equipment/Devices: Rolling walker, 3 in 1, wheelchair  Discharge Condition: Stable CODE STATUS: Full code Diet recommendation: Heart healthy   Brief/Interim Summary:  Admission HPI written by Darliss Cheney, MD   Chief Complaint: Bilateral knee wounds  HPI: Morgan [name] is a 69 y.o. female with medical history significant of hypertension, hyperlipidemia, rheumatoid arthritis and psoriatic arthritis was sent to the ED from her orthopedics office.  Apparently, patient failed from few steps 5 to 6 weeks ago and ended up having bilateral knee wounds.  She was seen at the local ED at some outside town and she had 21 stitches on the left knee.  According to patient, right knee only had a bump and bruise.  Subsequently she had seen her PCP as well as other physicians and has had 1-2 rounds of antibiotics but her wounds have been getting worse.  She saw her rheumatologist yesterday and she was referred to orthopedics today who had seen her in the clinic and after thoroughly evaluating her, they sent her to the ED to be admitted for bilateral knee washout tomorrow.  Patient has no other complaints such as chest pain, shortness of breath, fever, chills, sweating, any problem with urination or bowel movement.  No recent travel or sick contact.  ED Course: Upon arrival to ED, patient is hemodynamically stable.  CBC shows chronic leukocytosis and chronic anemia with stable hemoglobin.  Case was discussed with  orthopedics who had deferred admission to medicine service and they will consult and provide surgical services.    Hospital course:  Cellulitis Bilateral knee wounds Septic prepatellar bursitis Patient underwent I&D in the operating room. Wound cultures significant for MSSA bilaterally in addition to Prevotella on left and Group B strep on right wound cultures . Patient was initially empirically treated with Vancomycin/Zosyn, transitioned to Cefazolin and is now transtioned to Unasyn. Recommendations for a total of 3 weeks of antibiotics. Patient receiving daily dressing changes per orthopedic surgery recommendations. WBAT per orthopedic surgery recommendations. PT/Ot recommending home health in addition to wheelchair, rolling walker, 3 in 1 commode. Discharged on Augmentin to continue 3 weeks total of antibiotics.  Hypokalemia Resolved  Normocytic anemia Baseline around 11. Down to a low of 8.9 post surgery.   Essential hypertension Patient is on hydrochlorothiazide as an outpatient which was held. Blood pressure mildly uncontrolled.  Leukocytosis Patient has a history of persistent leukocytosis. Differential shows reduction in neutrophils from admission. Monocytosis present which is consistent with history.  Rheumatoid arthritis Psoriatic arthritis Patient is on prednisone, Plaquenil and Cosentyx as an outpatient. Plaquenil held on admission secondary to infection. Consentyx given as a monthly injection outpatient. Continue prednisone. Discuss resumption of Plaquenil with outpatient physician.  Hyperlipidemia Continue Simvastatin  Discharge Diagnoses:  Principal Problem:   Septic prepatellar bursitis Active Problems:   Mixed hyperlipidemia   Essential hypertension   Wound dehiscence   Cellulitis    Discharge Instructions  Discharge Instructions    Call MD for:  severe uncontrolled pain   Complete by: As directed  Call MD for:  temperature >100.4   Complete by:  As directed      Allergies as of 04/28/2020      Reactions   Remicade [infliximab] Swelling, Other (See Comments)   Facial swelling, throat was closing up.       Medication List    STOP taking these medications   aspirin 81 MG tablet Replaced by: aspirin EC 325 MG tablet   doxycycline 100 MG tablet Commonly known as: VIBRA-TABS   ibuprofen 200 MG tablet Commonly known as: ADVIL     TAKE these medications   acetaminophen 500 MG tablet Commonly known as: TYLENOL Take 500 mg by mouth every 6 (six) hours as needed for moderate pain or headache.   amoxicillin-clavulanate 875-125 MG tablet Commonly known as: Augmentin Take 1 tablet by mouth 2 (two) times daily for 14 days.   ARTIFICIAL TEARS OP Place 1 drop into both eyes daily as needed (for dry eyes).   aspirin EC 325 MG tablet Take 1 tablet (325 mg total) by mouth daily for 21 days. Then resume home dose of aspirin 81 mg daily. Replaces: aspirin 81 MG tablet   Calcium Carbonate-Vitamin D 600-400 MG-UNIT tablet Take 1 tablet by mouth 2 (two) times daily.   clobetasol ointment 0.05 % Commonly known as: TEMOVATE Apply 1 application topically 2 (two) times daily as needed (rash).   Cosentyx Sensoready (300 MG) 150 MG/ML Soaj Generic drug: Secukinumab (300 MG Dose) Inject 300 mg as directed. Once every 4 weeks   doxepin 10 MG capsule Commonly known as: SINEQUAN Take 10 mg by mouth 2 (two) times daily.   fenofibrate 160 MG tablet TAKE 1 TABLET (160 MG TOTAL) BY MOUTH AT BEDTIME.   fexofenadine 180 MG tablet Commonly known as: ALLEGRA Take 180 mg by mouth daily.   fluticasone 50 MCG/ACT nasal spray Commonly known as: FLONASE Place 2 sprays into both nostrils daily as needed for allergies.   hydrochlorothiazide 25 MG tablet Commonly known as: HYDRODIURIL TAKE 1 TABLET BY MOUTH EVERY DAY   hydroxychloroquine 200 MG tablet Commonly known as: PLAQUENIL Hold. Please discuss with your rheumatologist prior to  restarting. What changed:   how much to take  how to take this  when to take this  additional instructions   methocarbamol 500 MG tablet Commonly known as: Robaxin Take 1 tablet (500 mg total) by mouth 4 (four) times daily.   multivitamin tablet Take 1 tablet by mouth daily.   naproxen sodium 220 MG tablet Commonly known as: ALEVE Take 220 mg by mouth daily as needed (pain).   oxyCODONE 5 MG immediate release tablet Commonly known as: Roxicodone Take 1 tablet (5 mg total) by mouth every 4 (four) hours as needed for up to 7 days.   polyethylene glycol 17 g packet Commonly known as: MiraLax Take 17 g by mouth daily.   predniSONE 5 MG tablet Commonly known as: DELTASONE Take 5 mg by mouth daily.   senna 8.6 MG Tabs tablet Commonly known as: SENOKOT Take 1 tablet (8.6 mg total) by mouth daily.   simvastatin 40 MG tablet Commonly known as: ZOCOR TAKE 1 TABLET BY MOUTH EVERYDAY AT BEDTIME What changed: See the new instructions.   Vitamin D 50 MCG (2000 UT) Caps Take 2,000 Units by mouth daily.            Durable Medical Equipment  (From admission, onward)         Start     Ordered   04/27/20  1955  For home use only DME lightweight manual wheelchair with seat cushion  Once    Comments: Patient suffers from bilateral septic bursitis which impairs their ability to perform daily activities like bathing and toileting in the home.  A cane, crutch or walker will not resolve  issue with performing activities of daily living. A wheelchair will allow patient to safely perform daily activities. Patient is not able to propel themselves in the home using a standard weight wheelchair due to endurance. Patient can self propel in the lightweight wheelchair. Length of need 6 months . Accessories: elevating leg rests (ELRs), wheel locks, extensions and anti-tippers.   04/27/20 1955   04/26/20 1505  For home use only DME 3 n 1  Once     04/26/20 1504   04/26/20 1504  For home  use only DME Walker rolling  Once    Question Answer Comment  Walker: With [facility]   Patient needs a walker to treat with the following condition Septic bursitis      04/26/20 1504         Follow-up [place] [place] Follow up.   Why: They will call you to arrange services Contact information: [place] [address] [phone]        Thayer Headings, MD. Schedule an appointment as soon as possible for a visit in 2 week(s).   Specialty: Infectious Diseases Contact information: [address] [place] [address] [phone]        Sydnee Cabal, MD. Schedule an appointment as soon as possible for a visit in 2 week(s).   Specialty: Orthopedic Surgery Contact information: [address] [place] [address]  [place] [address] [phone]          Allergies  Allergen Reactions  . Remicade [Infliximab] Swelling and Other (See Comments)    Facial swelling, throat was closing up.     Consultations:  Orthopedic surgery  Infectious disease   Procedures/Studies: [place] LIMITED JOINT SPACE STRUCTURES LOW RIGHT(NO LINKED CHARGES)  Result Date: 04/12/2020  Diagnostic Limited MSK Ultrasound of: Right knee Area of swelling is hypoechoic consistent with prepatellar bursitis. Impression: Right knee prepatellar bursitis     EXCISIONAL DEBRIDEMENT OF SKIN AND SUBCUTANEOUS TISSUE BILATERAL KNEES/WOUND VAC PLACEMENT (04/22/2020)  SECOND LOOK IRRIGATION DEBRIDEMENT(04/24/2020)   Subjective: Ambulation is improved. More confident.  Discharge Exam: Vitals:   04/28/20 0547 04/28/20 1501  BP: 140/61 (!) 146/58  Pulse: 80 84  Resp: 17 16  Temp: 98.1 F (36.7 C) 98.3 F (36.8 C)  SpO2: 95% 98%   Vitals:   04/27/20 2146 04/28/20 0547 04/28/20 0900 04/28/20 1501  BP: (!) 158/66 140/61  (!) 146/58  Pulse: 73 80  84  Resp: 17 17  16   Temp: 98.1 F (36.7 C) 98.1 F (36.7 C)  98.3 F (36.8  C)  TempSrc: Oral   Oral  SpO2: 95% 95%  98%  Weight:   99.9 kg   Height:        General: Pt is alert, awake, not in acute distress Cardiovascular: RRR, S1/S2 +, no rubs, no gallops Respiratory: CTA bilaterally, no wheezing, no rhonchi Abdominal: Soft, NT, ND, bowel sounds + Extremities: no edema, no cyanosis. Right leg in ACE bandages and left leg in knee immobilizer    The results of significant diagnostics from this hospitalization (including imaging, microbiology, ancillary and laboratory) are listed below for reference.     Microbiology: Recent  Results (from the past 240 hour(s))  SARS Coronavirus 2 by RT PCR (hospital order, performed in [facility] hospital lab) Nasopharyngeal Nasopharyngeal Swab     Status: None   Collection Time: 04/21/20  2:43 PM   Specimen: Nasopharyngeal Swab  Result Value Ref Range Status   SARS Coronavirus 2 NEGATIVE NEGATIVE Final    Comment: (NOTE) SARS-CoV-2 target nucleic acids are NOT DETECTED. The SARS-CoV-2 RNA is generally detectable in upper and lower respiratory specimens during the acute phase of infection. The lowest concentration of SARS-CoV-2 viral copies this assay can detect is 250 copies / mL. A negative result does not preclude SARS-CoV-2 infection and should not be used as the sole basis for treatment or other patient management decisions.  A negative result may occur with improper specimen collection / handling, submission of specimen other than nasopharyngeal swab, presence of viral mutation(s) within the areas targeted by this assay, and inadequate number of viral copies (<250 copies / mL). A negative result must be combined with clinical observations, patient history, and epidemiological information. Fact Sheet for Patients:   StrictlyIdeas.no Fact Sheet for Healthcare Providers: BankingDealers.co.za This test is not yet approved or cleared  by the [place] FDA and has  been authorized for detection and/or diagnosis of SARS-CoV-2 by FDA under an Emergency Use Authorization (EUA).  This EUA will remain in effect (meaning this test can be used) for the duration of the COVID-19 declaration under Section 564(b)(1) of the Act, 21 U.S.C. section 360bbb-3(b)(1), unless the authorization is terminated or revoked sooner. Performed at [facility], [place] [address] [place], [place] [address]   Surgical PCR screen     Status: Abnormal   Collection Time: 04/22/20  5:35 AM   Specimen: Nasal Mucosa; Nasal Swab  Result Value Ref Range Status   MRSA, PCR NEGATIVE NEGATIVE Final   Staphylococcus aureus POSITIVE (A) NEGATIVE Final    Comment: (NOTE) The Xpert SA Assay (FDA approved for NASAL specimens in patients 9 years of age and older), is one component of a comprehensive surveillance program. It is not intended to diagnose infection nor to guide or monitor treatment. Performed at [facility], [place] [address], [place], [place] [address]   Fungus Culture With Stain     Status: None (Preliminary result)   Collection Time: 04/22/20 12:40 PM   Specimen: Wound  Result Value Ref Range Status   Fungus Stain Final report  Final    Comment: (NOTE) Performed At: [facility] [place], [place] [id number] Rush Farmer MD [id number]    Fungus (Mycology) Culture PENDING  Incomplete   Fungal Source RIGHT  Final    Comment: KNEE FLUID Performed at [facility], [place] [address], [place], [place] [address]   Fungus Culture With Stain     Status: None (Preliminary result)   Collection Time: 04/22/20 12:40 PM   Specimen: Soft Tissue, Other  Result Value Ref Range Status   Fungus Stain Final report  Final    Comment: (NOTE) Performed At: [facility] [place], [place] [id number] Rush Farmer MD [id number]    Fungus (Mycology) Culture PENDING  Incomplete    Fungal Source RIGHT  Final    Comment: KNEE TISSUE Performed at [facility], [place] [address], [place], [place] [address]   Fungus Culture With Stain     Status: None (Preliminary result)   Collection Time: 04/22/20 12:40 PM   Specimen: Wound  Result Value  Ref Range Status   Fungus Stain Final report  Final    Comment: (NOTE) Performed At: [facility] [place], [place] [id number] Rush Farmer MD [id number]    Fungus (Mycology) Culture PENDING  Incomplete   Fungal Source LEFT  Final    Comment: KNEE TISSUE Performed at [facility], [place] [address] [place], [place] [address]   Aerobic/Anaerobic Culture (surgical/deep wound)     Status: None   Collection Time: 04/22/20 12:40 PM   Specimen: Soft Tissue, Other  Result Value Ref Range Status   Specimen Description   Final    KNEE LEFT FLUID Performed at [place] [address], [place], [place] [address]    Special Requests   Final    NONE Performed at [facility], [place] [address] [place], [place] [address]    Gram Stain   Final    MODERATE WBC PRESENT,BOTH PMN AND MONONUCLEAR FEW GRAM POSITIVE COCCI    Culture   Final    FEW STAPHYLOCOCCUS AUREUS RARE PREVOTELLA MELANINOGENICA BETA LACTAMASE POSITIVE Performed at [facility], [place] [address], [place], [place] [address]    Report Status 04/25/2020 FINAL  Final   Organism ID, Bacteria STAPHYLOCOCCUS AUREUS  Final      Susceptibility   Staphylococcus aureus - MIC*    CIPROFLOXACIN <=0.5 SENSITIVE Sensitive     ERYTHROMYCIN <=0.25 SENSITIVE Sensitive     GENTAMICIN <=0.5 SENSITIVE Sensitive     OXACILLIN 0.5 SENSITIVE Sensitive     TETRACYCLINE >=16 RESISTANT Resistant     VANCOMYCIN 1 SENSITIVE Sensitive     TRIMETH/SULFA <=10 SENSITIVE Sensitive     CLINDAMYCIN <=0.25 SENSITIVE Sensitive     RIFAMPIN <=0.5 SENSITIVE Sensitive     Inducible Clindamycin  NEGATIVE Sensitive     * FEW STAPHYLOCOCCUS AUREUS  Aerobic/Anaerobic Culture (surgical/deep wound)     Status: None   Collection Time: 04/22/20 12:40 PM   Specimen: Wound  Result Value Ref Range Status   Specimen Description   Final    KNEE RIGHT FLUID Performed at [place] [address], [place], [place] [address]    Special Requests   Final    NONE Performed at [facility], [place] [address] [place], [place] [address]    Gram Stain   Final    ABUNDANT WBC PRESENT,BOTH PMN AND MONONUCLEAR NO ORGANISMS SEEN    Culture   Final    FEW STAPHYLOCOCCUS AUREUS RARE GROUP B STREP(S.AGALACTIAE)ISOLATED TESTING AGAINST S. AGALACTIAE NOT ROUTINELY PERFORMED DUE TO PREDICTABILITY OF AMP/PEN/VAN SUSCEPTIBILITY. NO ANAEROBES ISOLATED Performed at [facility], [place] [address], [place], [place] [address]    Report Status 04/27/2020 FINAL  Final   Organism ID, Bacteria STAPHYLOCOCCUS AUREUS  Final      Susceptibility   Staphylococcus aureus - MIC*    CIPROFLOXACIN <=0.5 SENSITIVE Sensitive     ERYTHROMYCIN <=0.25 SENSITIVE Sensitive     GENTAMICIN <=0.5 SENSITIVE Sensitive     OXACILLIN 0.5 SENSITIVE Sensitive     TETRACYCLINE >=16 RESISTANT Resistant     VANCOMYCIN 1 SENSITIVE Sensitive     TRIMETH/SULFA <=10 SENSITIVE Sensitive     CLINDAMYCIN <=0.25 SENSITIVE Sensitive     RIFAMPIN <=0.5 SENSITIVE Sensitive     Inducible Clindamycin NEGATIVE Sensitive     * FEW STAPHYLOCOCCUS AUREUS  Aerobic/Anaerobic Culture (surgical/deep wound)     Status: None   Collection Time: 04/22/20 12:40 PM  Specimen: Soft Tissue, Other  Result Value Ref Range Status   Specimen Description   Final    KNEE RIGHT TISSUE Performed at [place] [address], [place], [place] [address]    Special Requests   Final    NONE Performed at [facility], [place] [address], [place], [place] [address]    Gram Stain   Final      FEW WBC PRESENT,BOTH PMN AND MONONUCLEAR FEW GRAM POSITIVE COCCI    Culture   Final    ABUNDANT STAPHYLOCOCCUS AUREUS MODERATE GROUP B STREP(S.AGALACTIAE)ISOLATED TESTING AGAINST S. AGALACTIAE NOT ROUTINELY PERFORMED DUE TO PREDICTABILITY OF AMP/PEN/VAN SUSCEPTIBILITY. NO ANAEROBES ISOLATED Performed at [facility], [place] [address] [place], [place] [address]    Report Status 04/27/2020 FINAL  Final   Organism ID, Bacteria STAPHYLOCOCCUS AUREUS  Final      Susceptibility   Staphylococcus aureus - MIC*    CIPROFLOXACIN <=0.5 SENSITIVE Sensitive     ERYTHROMYCIN 0.5 SENSITIVE Sensitive     GENTAMICIN <=0.5 SENSITIVE Sensitive     OXACILLIN 0.5 SENSITIVE Sensitive     TETRACYCLINE >=16 RESISTANT Resistant     VANCOMYCIN 1 SENSITIVE Sensitive     TRIMETH/SULFA <=10 SENSITIVE Sensitive     CLINDAMYCIN <=0.25 SENSITIVE Sensitive     RIFAMPIN <=0.5 SENSITIVE Sensitive     Inducible Clindamycin NEGATIVE Sensitive     * ABUNDANT STAPHYLOCOCCUS AUREUS  Aerobic/Anaerobic Culture (surgical/deep wound)     Status: None   Collection Time: 04/22/20 12:40 PM   Specimen: Wound  Result Value Ref Range Status   Specimen Description   Final    KNEE LEFT TISSUE Performed at [place] [address], [place], [place] [address]    Special Requests   Final    NONE Performed at [facility], [place] [address], [place], [place] [address]    Gram Stain   Final    ABUNDANT WBC PRESENT,BOTH PMN AND MONONUCLEAR FEW GRAM POSITIVE COCCI    Culture   Final    FEW STAPHYLOCOCCUS AUREUS RARE GROUP B STREP(S.AGALACTIAE)ISOLATED TESTING AGAINST S. AGALACTIAE NOT ROUTINELY PERFORMED DUE TO PREDICTABILITY OF AMP/PEN/VAN SUSCEPTIBILITY. NO ANAEROBES ISOLATED Performed at [facility], [place] [address] [place], [place] [address]    Report Status 04/27/2020 FINAL  Final   Organism ID, Bacteria STAPHYLOCOCCUS AUREUS  Final      Susceptibility    Staphylococcus aureus - MIC*    CIPROFLOXACIN <=0.5 SENSITIVE Sensitive     ERYTHROMYCIN <=0.25 SENSITIVE Sensitive     GENTAMICIN <=0.5 SENSITIVE Sensitive     OXACILLIN <=0.25 SENSITIVE Sensitive     TETRACYCLINE >=16 RESISTANT Resistant     VANCOMYCIN 1 SENSITIVE Sensitive     TRIMETH/SULFA <=10 SENSITIVE Sensitive     CLINDAMYCIN <=0.25 SENSITIVE Sensitive     RIFAMPIN <=0.5 SENSITIVE Sensitive     Inducible Clindamycin NEGATIVE Sensitive     * FEW STAPHYLOCOCCUS AUREUS  Acid Fast Smear (AFB)     Status: None   Collection Time: 04/22/20 12:40 PM   Specimen: Soft Tissue, Other  Result Value Ref Range Status   AFB Specimen Processing Comment  Final    Comment: Tissue Grinding and Digestion/Decontamination   Acid Fast Smear Negative  Final    Comment: (NOTE) Performed At: [facility] [place], [place] [id number] [place] Farmer MD [id number]    Source (AFB) RIGHT  Final    Comment: KNEE TISSUE Performed  at [facility], [place] [address], [place], [place] [address]   Acid Fast Smear (AFB)     Status: None   Collection Time: 04/22/20 12:40 PM   Specimen: Wound  Result Value Ref Range Status   AFB Specimen Processing Comment  Final    Comment: Tissue Grinding and Digestion/Decontamination   Acid Fast Smear Negative  Final    Comment: (NOTE) Performed At: [facility] [place], [place] [id number] [place] Farmer MD [id number]    Source (AFB) LEFT  Final    Comment: KNEE TISSUE Performed at [facility] [place] [address], [place], [place] [address]   Fungus Culture Result     Status: None   Collection Time: 04/22/20 12:40 PM  Result Value Ref Range Status   Result 1 Comment  Final    Comment: (NOTE) KOH/Calcofluor preparation:  no fungus observed. Performed At: [facility] [place], [place] [id number] [place] Farmer MD [id number]   Fungus Culture Result      Status: None   Collection Time: 04/22/20 12:40 PM  Result Value Ref Range Status   Result 1 Comment  Final    Comment: (NOTE) KOH/Calcofluor preparation:  no fungus observed. Performed At: [facility] [place], [place] [id number] [place] Farmer MD [id number]   Fungus Culture Result     Status: None   Collection Time: 04/22/20 12:40 PM  Result Value Ref Range Status   Result 1 Comment  Final    Comment: (NOTE) KOH/Calcofluor preparation:  no fungus observed. Performed At: [facility] [place], [place] [id number] [place] Farmer MD [id number]      Labs: BNP (last 3 results) No results for input(s): BNP in the last 8760 hours. Basic Metabolic Panel: Recent Labs  Lab 04/22/20 0602 04/23/20 0542 04/24/20 0600 04/25/20 0515 04/26/20 0555  NA 142 138 141 140 141  K 3.3* 3.6 3.8 4.2 3.8  CL 105 101 104 106 107  CO2 26 28 28 29 28   GLUCOSE 66* 97 95 100* 101*  BUN 20 15 12 15 23   CREATININE 1.00 0.75 0.80 0.71 0.78  CALCIUM 9.6 9.1 9.0 8.6* 8.6*  MG  --  1.7  --   --   --    Liver Function Tests: Recent Labs  Lab 04/22/20 0602  AST 30  ALT 22  ALKPHOS 39  BILITOT 0.8  PROT 6.6  ALBUMIN 3.9   No results for input(s): LIPASE, AMYLASE in the last 168 hours. No results for input(s): AMMONIA in the last 168 hours. CBC: Recent Labs  Lab 04/22/20 0602 04/23/20 0542 04/24/20 0600 04/26/20 0555 04/27/20 0533  WBC 15.1* 24.0* 22.0* 17.4* 20.5*  NEUTROABS  --   --   --   --  10.9*  HGB 11.1* 10.6* 10.3* 8.9* 9.4*  HCT 35.2* 33.3* 32.8* 29.1* 29.5*  MCV 94.9 93.5 96.2 97.0 95.2  PLT 97* 96* 88* 74* 81*   Cardiac Enzymes: No results for input(s): CKTOTAL, CKMB, CKMBINDEX, TROPONINI in the last 168 hours. BNP: Invalid input(s): POCBNP CBG: No results for input(s): GLUCAP in the last 168 hours. D-Dimer No results for input(s): DDIMER in the last 72 hours. Hgb A1c No results for input(s): HGBA1C in the last 72  hours. Lipid Profile No results for input(s): CHOL, HDL, LDLCALC, TRIG, CHOLHDL, LDLDIRECT in the last 72 hours. Thyroid function studies No results for input(s): TSH, T4TOTAL, T3FREE, THYROIDAB in the last 72 hours.  Invalid  input(s): FREET3 Anemia work up No results for input(s): VITAMINB12, FOLATE, FERRITIN, TIBC, IRON, RETICCTPCT in the last 72 hours. Urinalysis    Component Value Date/Time   COLORURINE YELLOW 06/18/2018 1120   APPEARANCEUR CLEAR 06/18/2018 1120   LABSPEC <1.005 (L) 06/18/2018 1120   PHURINE 7.0 06/18/2018 1120   GLUCOSEU NEGATIVE 06/18/2018 1120   GLUCOSEU NEGATIVE 01/13/2013 0842   HGBUR TRACE (A) 06/18/2018 1120   BILIRUBINUR Negative 01/25/2020 1433   KETONESUR NEGATIVE 06/18/2018 1120   PROTEINUR Negative 01/25/2020 1433   PROTEINUR NEGATIVE 06/18/2018 1120   UROBILINOGEN 0.2 01/25/2020 1433   UROBILINOGEN 0.2 01/13/2013 0842   NITRITE Negative 01/25/2020 1433   NITRITE NEGATIVE 06/18/2018 1120   LEUKOCYTESUR Negative 01/25/2020 1433   Sepsis Labs Invalid input(s): PROCALCITONIN,  WBC,  LACTICIDVEN Microbiology Recent Results (from the past 240 hour(s))  SARS Coronavirus 2 by RT PCR (hospital order, performed in [facility] hospital lab) Nasopharyngeal Nasopharyngeal Swab     Status: None   Collection Time: 04/21/20  2:43 PM   Specimen: Nasopharyngeal Swab  Result Value Ref Range Status   SARS Coronavirus 2 NEGATIVE NEGATIVE Final    Comment: (NOTE) SARS-CoV-2 target nucleic acids are NOT DETECTED. The SARS-CoV-2 RNA is generally detectable in upper and lower respiratory specimens during the acute phase of infection. The lowest concentration of SARS-CoV-2 viral copies this assay can detect is 250 copies / mL. A negative result does not preclude SARS-CoV-2 infection and should not be used as the sole basis for treatment or other patient management decisions.  A negative result may occur with improper specimen collection / handling, submission of  specimen other than nasopharyngeal swab, presence of viral mutation(s) within the areas targeted by this assay, and inadequate number of viral copies (<250 copies / mL). A negative result must be combined with clinical observations, patient history, and epidemiological information. Fact Sheet for Patients:   StrictlyIdeas.no Fact Sheet for Healthcare Providers: BankingDealers.co.za This test is not yet approved or cleared  by the [place] FDA and has been authorized for detection and/or diagnosis of SARS-CoV-2 by FDA under an Emergency Use Authorization (EUA).  This EUA will remain in effect (meaning this test can be used) for the duration of the COVID-19 declaration under Section 564(b)(1) of the Act, 21 U.S.C. section 360bbb-3(b)(1), unless the authorization is terminated or revoked sooner. Performed at [facility], [place] [address], [place], [place] [address]   Surgical PCR screen     Status: Abnormal   Collection Time: 04/22/20  5:35 AM   Specimen: Nasal Mucosa; Nasal Swab  Result Value Ref Range Status   MRSA, PCR NEGATIVE NEGATIVE Final   Staphylococcus aureus POSITIVE (A) NEGATIVE Final    Comment: (NOTE) The Xpert SA Assay (FDA approved for NASAL specimens in patients 4 years of age and older), is one component of a comprehensive surveillance program. It is not intended to diagnose infection nor to guide or monitor treatment. Performed at [facility], [place] [address] [place], [place] [address]   Fungus Culture With Stain     Status: None (Preliminary result)   Collection Time: 04/22/20 12:40 PM   Specimen: Wound  Result Value Ref Range Status   Fungus Stain Final report  Final    Comment: (NOTE) Performed At: [facility] [place], [place] [id number] Rush Farmer MD [id number]    Fungus (Mycology) Culture PENDING  Incomplete   Fungal Source RIGHT   Final    Comment: KNEE  FLUID Performed at [facility], [place] [address], [place], [place] [address]   Fungus Culture With Stain     Status: None (Preliminary result)   Collection Time: 04/22/20 12:40 PM   Specimen: Soft Tissue, Other  Result Value Ref Range Status   Fungus Stain Final report  Final    Comment: (NOTE) Performed At: [facility] [place], [place] [id number] Rush Farmer MD [id number]    Fungus (Mycology) Culture PENDING  Incomplete   Fungal Source RIGHT  Final    Comment: KNEE TISSUE Performed at [facility], [place] [address], [place], [place] [address]   Fungus Culture With Stain     Status: None (Preliminary result)   Collection Time: 04/22/20 12:40 PM   Specimen: Wound  Result Value Ref Range Status   Fungus Stain Final report  Final    Comment: (NOTE) Performed At: [facility] [place], [place] [id number] Rush Farmer MD [id number]    Fungus (Mycology) Culture PENDING  Incomplete   Fungal Source LEFT  Final    Comment: KNEE TISSUE Performed at [facility], [place] [address], [place], [place] [address]   Aerobic/Anaerobic Culture (surgical/deep wound)     Status: None   Collection Time: 04/22/20 12:40 PM   Specimen: Soft Tissue, Other  Result Value Ref Range Status   Specimen Description   Final    KNEE LEFT FLUID Performed at [place] [address] [place], [place] [address]    Special Requests   Final    NONE Performed at [facility], [place] [address] [place], [place] [address]    Gram Stain   Final    MODERATE WBC PRESENT,BOTH PMN AND MONONUCLEAR FEW GRAM POSITIVE COCCI    Culture   Final    FEW STAPHYLOCOCCUS AUREUS RARE PREVOTELLA MELANINOGENICA BETA LACTAMASE POSITIVE Performed at [facility], [place] [address] [place], [place] [address]    Report Status 04/25/2020 FINAL  Final     Organism ID, Bacteria STAPHYLOCOCCUS AUREUS  Final      Susceptibility   Staphylococcus aureus - MIC*    CIPROFLOXACIN <=0.5 SENSITIVE Sensitive     ERYTHROMYCIN <=0.25 SENSITIVE Sensitive     GENTAMICIN <=0.5 SENSITIVE Sensitive     OXACILLIN 0.5 SENSITIVE Sensitive     TETRACYCLINE >=16 RESISTANT Resistant     VANCOMYCIN 1 SENSITIVE Sensitive     TRIMETH/SULFA <=10 SENSITIVE Sensitive     CLINDAMYCIN <=0.25 SENSITIVE Sensitive     RIFAMPIN <=0.5 SENSITIVE Sensitive     Inducible Clindamycin NEGATIVE Sensitive     * FEW STAPHYLOCOCCUS AUREUS  Aerobic/Anaerobic Culture (surgical/deep wound)     Status: None   Collection Time: 04/22/20 12:40 PM   Specimen: Wound  Result Value Ref Range Status   Specimen Description   Final    KNEE RIGHT FLUID Performed at [place] [address] [place], [place] [address]    Special Requests   Final    NONE Performed at [facility], [place] [address] [place], [place] [address]    Gram Stain   Final    ABUNDANT WBC PRESENT,BOTH PMN AND MONONUCLEAR NO ORGANISMS SEEN    Culture   Final    FEW STAPHYLOCOCCUS AUREUS RARE GROUP B STREP(S.AGALACTIAE)ISOLATED TESTING AGAINST S. AGALACTIAE NOT ROUTINELY PERFORMED DUE TO PREDICTABILITY OF AMP/PEN/VAN SUSCEPTIBILITY. NO ANAEROBES ISOLATED Performed at [facility], [place] [address],  [place], [place] [address]    Report Status 04/27/2020 FINAL  Final   Organism ID, Bacteria STAPHYLOCOCCUS AUREUS  Final      Susceptibility   Staphylococcus aureus - MIC*    CIPROFLOXACIN <=0.5 SENSITIVE Sensitive     ERYTHROMYCIN <=0.25 SENSITIVE Sensitive     GENTAMICIN <=0.5 SENSITIVE Sensitive     OXACILLIN 0.5 SENSITIVE Sensitive     TETRACYCLINE >=16 RESISTANT Resistant     VANCOMYCIN 1 SENSITIVE Sensitive     TRIMETH/SULFA <=10 SENSITIVE Sensitive     CLINDAMYCIN <=0.25 SENSITIVE Sensitive     RIFAMPIN <=0.5 SENSITIVE Sensitive     Inducible Clindamycin  NEGATIVE Sensitive     * FEW STAPHYLOCOCCUS AUREUS  Aerobic/Anaerobic Culture (surgical/deep wound)     Status: None   Collection Time: 04/22/20 12:40 PM   Specimen: Soft Tissue, Other  Result Value Ref Range Status   Specimen Description   Final    KNEE RIGHT TISSUE Performed at [place] [address] [place], [place] [address]    Special Requests   Final    NONE Performed at [facility], [place] [address] [place], [place] [address]    Gram Stain   Final    FEW WBC PRESENT,BOTH PMN AND MONONUCLEAR FEW GRAM POSITIVE COCCI    Culture   Final    ABUNDANT STAPHYLOCOCCUS AUREUS MODERATE GROUP B STREP(S.AGALACTIAE)ISOLATED TESTING AGAINST S. AGALACTIAE NOT ROUTINELY PERFORMED DUE TO PREDICTABILITY OF AMP/PEN/VAN SUSCEPTIBILITY. NO ANAEROBES ISOLATED Performed at [facility], [place] [address] [place], [place] [address]    Report Status 04/27/2020 FINAL  Final   Organism ID, Bacteria STAPHYLOCOCCUS AUREUS  Final      Susceptibility   Staphylococcus aureus - MIC*    CIPROFLOXACIN <=0.5 SENSITIVE Sensitive     ERYTHROMYCIN 0.5 SENSITIVE Sensitive     GENTAMICIN <=0.5 SENSITIVE Sensitive     OXACILLIN 0.5 SENSITIVE Sensitive     TETRACYCLINE >=16 RESISTANT Resistant     VANCOMYCIN 1 SENSITIVE Sensitive     TRIMETH/SULFA <=10 SENSITIVE Sensitive     CLINDAMYCIN <=0.25 SENSITIVE Sensitive     RIFAMPIN <=0.5 SENSITIVE Sensitive     Inducible Clindamycin NEGATIVE Sensitive     * ABUNDANT STAPHYLOCOCCUS AUREUS  Aerobic/Anaerobic Culture (surgical/deep wound)     Status: None   Collection Time: 04/22/20 12:40 PM   Specimen: Wound  Result Value Ref Range Status   Specimen Description   Final    KNEE LEFT TISSUE Performed at [place] [address], [place], [place] [address]    Special Requests   Final    NONE Performed at [facility], [place] [address], [place], [place] [address]    Gram  Stain   Final    ABUNDANT WBC PRESENT,BOTH PMN AND MONONUCLEAR FEW GRAM POSITIVE COCCI    Culture   Final    FEW STAPHYLOCOCCUS AUREUS RARE GROUP B STREP(S.AGALACTIAE)ISOLATED TESTING AGAINST S. AGALACTIAE NOT ROUTINELY PERFORMED DUE TO PREDICTABILITY OF AMP/PEN/VAN SUSCEPTIBILITY. NO ANAEROBES ISOLATED Performed at [facility], [place] [address], [place], [place] [address]    Report Status 04/27/2020 FINAL  Final   Organism ID, Bacteria STAPHYLOCOCCUS AUREUS  Final      Susceptibility   Staphylococcus aureus - MIC*    CIPROFLOXACIN <=0.5 SENSITIVE Sensitive     ERYTHROMYCIN <=0.25 SENSITIVE Sensitive     GENTAMICIN <=0.5 SENSITIVE Sensitive     OXACILLIN <=0.25 SENSITIVE Sensitive     TETRACYCLINE >=16  RESISTANT Resistant     VANCOMYCIN 1 SENSITIVE Sensitive     TRIMETH/SULFA <=10 SENSITIVE Sensitive     CLINDAMYCIN <=0.25 SENSITIVE Sensitive     RIFAMPIN <=0.5 SENSITIVE Sensitive     Inducible Clindamycin NEGATIVE Sensitive     * FEW STAPHYLOCOCCUS AUREUS  Acid Fast Smear (AFB)     Status: None   Collection Time: 04/22/20 12:40 PM   Specimen: Soft Tissue, Other  Result Value Ref Range Status   AFB Specimen Processing Comment  Final    Comment: Tissue Grinding and Digestion/Decontamination   Acid Fast Smear Negative  Final    Comment: (NOTE) Performed At: [facility] [place], [place] [id number] Rush Farmer MD [id number]    Source (AFB) RIGHT  Final    Comment: KNEE TISSUE Performed at [facility], [place] [address], [place], [place] [address]   Acid Fast Smear (AFB)     Status: None   Collection Time: 04/22/20 12:40 PM   Specimen: Wound  Result Value Ref Range Status   AFB Specimen Processing Comment  Final    Comment: Tissue Grinding and Digestion/Decontamination   Acid Fast Smear Negative  Final    Comment: (NOTE) Performed At: [facility] [place], [place] [id number] Rush Farmer MD  [id number]    Source (AFB) LEFT  Final    Comment: KNEE TISSUE Performed at [facility], [place] [address] [place], [place] [address]   Fungus Culture Result     Status: None   Collection Time: 04/22/20 12:40 PM  Result Value Ref Range Status   Result 1 Comment  Final    Comment: (NOTE) KOH/Calcofluor preparation:  no fungus observed. Performed At: [facility] [place], [place] [id number] Rush Farmer MD [id number]   Fungus Culture Result     Status: None   Collection Time: 04/22/20 12:40 PM  Result Value Ref Range Status   Result 1 Comment  Final    Comment: (NOTE) KOH/Calcofluor preparation:  no fungus observed. Performed At: [facility] [place], [place] [id number] Rush Farmer MD [id number]   Fungus Culture Result     Status: None   Collection Time: 04/22/20 12:40 PM  Result Value Ref Range Status   Result 1 Comment  Final    Comment: (NOTE) KOH/Calcofluor preparation:  no fungus observed. Performed At: [facility] [place], [place] [id number] Rush Farmer MD [id number]      Time coordinating discharge: 35 minutes  SIGNED:   Cordelia Poche, MD [facility] 04/28/2020, 5:45 PM

## 2020-04-29 ENCOUNTER — Ambulatory Visit: Payer: BC Managed Care – PPO | Admitting: Family Medicine

## 2020-04-30 DIAGNOSIS — B951 Streptococcus, group B, as the cause of diseases classified elsewhere: Secondary | ICD-10-CM | POA: Diagnosis not present

## 2020-04-30 DIAGNOSIS — Y9389 Activity, other specified: Secondary | ICD-10-CM | POA: Diagnosis not present

## 2020-04-30 DIAGNOSIS — L03115 Cellulitis of right lower limb: Secondary | ICD-10-CM | POA: Diagnosis not present

## 2020-04-30 DIAGNOSIS — B9561 Methicillin susceptible Staphylococcus aureus infection as the cause of diseases classified elsewhere: Secondary | ICD-10-CM | POA: Diagnosis not present

## 2020-04-30 DIAGNOSIS — M7041 Prepatellar bursitis, right knee: Secondary | ICD-10-CM | POA: Diagnosis not present

## 2020-04-30 DIAGNOSIS — M7042 Prepatellar bursitis, left knee: Secondary | ICD-10-CM | POA: Diagnosis not present

## 2020-04-30 DIAGNOSIS — D649 Anemia, unspecified: Secondary | ICD-10-CM | POA: Diagnosis not present

## 2020-04-30 DIAGNOSIS — L03116 Cellulitis of left lower limb: Secondary | ICD-10-CM | POA: Diagnosis not present

## 2020-04-30 DIAGNOSIS — T8133XD Disruption of traumatic injury wound repair, subsequent encounter: Secondary | ICD-10-CM | POA: Diagnosis not present

## 2020-04-30 DIAGNOSIS — B966 Bacteroides fragilis [B. fragilis] as the cause of diseases classified elsewhere: Secondary | ICD-10-CM | POA: Diagnosis not present

## 2020-04-30 DIAGNOSIS — S81002D Unspecified open wound, left knee, subsequent encounter: Secondary | ICD-10-CM | POA: Diagnosis not present

## 2020-04-30 DIAGNOSIS — D72829 Elevated white blood cell count, unspecified: Secondary | ICD-10-CM | POA: Diagnosis not present

## 2020-04-30 DIAGNOSIS — S81001D Unspecified open wound, right knee, subsequent encounter: Secondary | ICD-10-CM | POA: Diagnosis not present

## 2020-04-30 LAB — ACID FAST SMEAR (AFB, MYCOBACTERIA): Acid Fast Smear: NEGATIVE

## 2020-05-02 LAB — ACID FAST SMEAR (AFB, MYCOBACTERIA): Acid Fast Smear: NEGATIVE

## 2020-05-03 DIAGNOSIS — B9561 Methicillin susceptible Staphylococcus aureus infection as the cause of diseases classified elsewhere: Secondary | ICD-10-CM | POA: Diagnosis not present

## 2020-05-03 DIAGNOSIS — D649 Anemia, unspecified: Secondary | ICD-10-CM | POA: Diagnosis not present

## 2020-05-03 DIAGNOSIS — Y9389 Activity, other specified: Secondary | ICD-10-CM | POA: Diagnosis not present

## 2020-05-03 DIAGNOSIS — B951 Streptococcus, group B, as the cause of diseases classified elsewhere: Secondary | ICD-10-CM | POA: Diagnosis not present

## 2020-05-03 DIAGNOSIS — L03115 Cellulitis of right lower limb: Secondary | ICD-10-CM | POA: Diagnosis not present

## 2020-05-03 DIAGNOSIS — D72829 Elevated white blood cell count, unspecified: Secondary | ICD-10-CM | POA: Diagnosis not present

## 2020-05-03 DIAGNOSIS — S81001D Unspecified open wound, right knee, subsequent encounter: Secondary | ICD-10-CM | POA: Diagnosis not present

## 2020-05-03 DIAGNOSIS — B966 Bacteroides fragilis [B. fragilis] as the cause of diseases classified elsewhere: Secondary | ICD-10-CM | POA: Diagnosis not present

## 2020-05-03 DIAGNOSIS — S81002D Unspecified open wound, left knee, subsequent encounter: Secondary | ICD-10-CM | POA: Diagnosis not present

## 2020-05-03 DIAGNOSIS — M7041 Prepatellar bursitis, right knee: Secondary | ICD-10-CM | POA: Diagnosis not present

## 2020-05-03 DIAGNOSIS — L03116 Cellulitis of left lower limb: Secondary | ICD-10-CM | POA: Diagnosis not present

## 2020-05-03 DIAGNOSIS — M7042 Prepatellar bursitis, left knee: Secondary | ICD-10-CM | POA: Diagnosis not present

## 2020-05-03 DIAGNOSIS — T8133XD Disruption of traumatic injury wound repair, subsequent encounter: Secondary | ICD-10-CM | POA: Diagnosis not present

## 2020-05-04 DIAGNOSIS — S81002D Unspecified open wound, left knee, subsequent encounter: Secondary | ICD-10-CM | POA: Diagnosis not present

## 2020-05-04 DIAGNOSIS — B966 Bacteroides fragilis [B. fragilis] as the cause of diseases classified elsewhere: Secondary | ICD-10-CM | POA: Diagnosis not present

## 2020-05-04 DIAGNOSIS — D72829 Elevated white blood cell count, unspecified: Secondary | ICD-10-CM | POA: Diagnosis not present

## 2020-05-04 DIAGNOSIS — D649 Anemia, unspecified: Secondary | ICD-10-CM | POA: Diagnosis not present

## 2020-05-04 DIAGNOSIS — B951 Streptococcus, group B, as the cause of diseases classified elsewhere: Secondary | ICD-10-CM | POA: Diagnosis not present

## 2020-05-04 DIAGNOSIS — S81001D Unspecified open wound, right knee, subsequent encounter: Secondary | ICD-10-CM | POA: Diagnosis not present

## 2020-05-04 DIAGNOSIS — L03115 Cellulitis of right lower limb: Secondary | ICD-10-CM | POA: Diagnosis not present

## 2020-05-04 DIAGNOSIS — M7042 Prepatellar bursitis, left knee: Secondary | ICD-10-CM | POA: Diagnosis not present

## 2020-05-04 DIAGNOSIS — M7041 Prepatellar bursitis, right knee: Secondary | ICD-10-CM | POA: Diagnosis not present

## 2020-05-04 DIAGNOSIS — T8133XD Disruption of traumatic injury wound repair, subsequent encounter: Secondary | ICD-10-CM | POA: Diagnosis not present

## 2020-05-04 DIAGNOSIS — L03116 Cellulitis of left lower limb: Secondary | ICD-10-CM | POA: Diagnosis not present

## 2020-05-04 DIAGNOSIS — B9561 Methicillin susceptible Staphylococcus aureus infection as the cause of diseases classified elsewhere: Secondary | ICD-10-CM | POA: Diagnosis not present

## 2020-05-04 DIAGNOSIS — Y9389 Activity, other specified: Secondary | ICD-10-CM | POA: Diagnosis not present

## 2020-05-05 ENCOUNTER — Telehealth: Payer: Self-pay | Admitting: Family Medicine

## 2020-05-05 DIAGNOSIS — B966 Bacteroides fragilis [B. fragilis] as the cause of diseases classified elsewhere: Secondary | ICD-10-CM | POA: Diagnosis not present

## 2020-05-05 DIAGNOSIS — S81002D Unspecified open wound, left knee, subsequent encounter: Secondary | ICD-10-CM | POA: Diagnosis not present

## 2020-05-05 DIAGNOSIS — T8133XD Disruption of traumatic injury wound repair, subsequent encounter: Secondary | ICD-10-CM | POA: Diagnosis not present

## 2020-05-05 DIAGNOSIS — M7042 Prepatellar bursitis, left knee: Secondary | ICD-10-CM | POA: Diagnosis not present

## 2020-05-05 DIAGNOSIS — M7041 Prepatellar bursitis, right knee: Secondary | ICD-10-CM | POA: Diagnosis not present

## 2020-05-05 DIAGNOSIS — D72829 Elevated white blood cell count, unspecified: Secondary | ICD-10-CM | POA: Diagnosis not present

## 2020-05-05 DIAGNOSIS — D649 Anemia, unspecified: Secondary | ICD-10-CM | POA: Diagnosis not present

## 2020-05-05 DIAGNOSIS — B9561 Methicillin susceptible Staphylococcus aureus infection as the cause of diseases classified elsewhere: Secondary | ICD-10-CM | POA: Diagnosis not present

## 2020-05-05 DIAGNOSIS — S81001D Unspecified open wound, right knee, subsequent encounter: Secondary | ICD-10-CM | POA: Diagnosis not present

## 2020-05-05 DIAGNOSIS — L03115 Cellulitis of right lower limb: Secondary | ICD-10-CM | POA: Diagnosis not present

## 2020-05-05 DIAGNOSIS — Y9389 Activity, other specified: Secondary | ICD-10-CM | POA: Diagnosis not present

## 2020-05-05 DIAGNOSIS — B951 Streptococcus, group B, as the cause of diseases classified elsewhere: Secondary | ICD-10-CM | POA: Diagnosis not present

## 2020-05-05 DIAGNOSIS — L03116 Cellulitis of left lower limb: Secondary | ICD-10-CM | POA: Diagnosis not present

## 2020-05-05 NOTE — Telephone Encounter (Signed)
[place] for referral. Please give verbal orders. thanks

## 2020-05-05 NOTE — Telephone Encounter (Signed)
Home Health verbal orders-caller/Agency: [facility] number: [phone]  Requesting OT/PT/Skilled nursing/Social Work/Speech:   Verbal orders for skilled nursing   Reason:   Frequency:

## 2020-05-05 NOTE — Telephone Encounter (Signed)
LMOVM with OK verbal orders

## 2020-05-06 DIAGNOSIS — S81002D Unspecified open wound, left knee, subsequent encounter: Secondary | ICD-10-CM | POA: Diagnosis not present

## 2020-05-06 DIAGNOSIS — L03115 Cellulitis of right lower limb: Secondary | ICD-10-CM | POA: Diagnosis not present

## 2020-05-06 DIAGNOSIS — Y9389 Activity, other specified: Secondary | ICD-10-CM | POA: Diagnosis not present

## 2020-05-06 DIAGNOSIS — D649 Anemia, unspecified: Secondary | ICD-10-CM | POA: Diagnosis not present

## 2020-05-06 DIAGNOSIS — M7041 Prepatellar bursitis, right knee: Secondary | ICD-10-CM | POA: Diagnosis not present

## 2020-05-06 DIAGNOSIS — M7042 Prepatellar bursitis, left knee: Secondary | ICD-10-CM | POA: Diagnosis not present

## 2020-05-06 DIAGNOSIS — S81001D Unspecified open wound, right knee, subsequent encounter: Secondary | ICD-10-CM | POA: Diagnosis not present

## 2020-05-06 DIAGNOSIS — B966 Bacteroides fragilis [B. fragilis] as the cause of diseases classified elsewhere: Secondary | ICD-10-CM | POA: Diagnosis not present

## 2020-05-06 DIAGNOSIS — T8133XD Disruption of traumatic injury wound repair, subsequent encounter: Secondary | ICD-10-CM | POA: Diagnosis not present

## 2020-05-06 DIAGNOSIS — D72829 Elevated white blood cell count, unspecified: Secondary | ICD-10-CM | POA: Diagnosis not present

## 2020-05-06 DIAGNOSIS — B951 Streptococcus, group B, as the cause of diseases classified elsewhere: Secondary | ICD-10-CM | POA: Diagnosis not present

## 2020-05-06 DIAGNOSIS — L03116 Cellulitis of left lower limb: Secondary | ICD-10-CM | POA: Diagnosis not present

## 2020-05-06 DIAGNOSIS — B9561 Methicillin susceptible Staphylococcus aureus infection as the cause of diseases classified elsewhere: Secondary | ICD-10-CM | POA: Diagnosis not present

## 2020-05-09 DIAGNOSIS — D72829 Elevated white blood cell count, unspecified: Secondary | ICD-10-CM | POA: Diagnosis not present

## 2020-05-09 DIAGNOSIS — B951 Streptococcus, group B, as the cause of diseases classified elsewhere: Secondary | ICD-10-CM | POA: Diagnosis not present

## 2020-05-09 DIAGNOSIS — M7042 Prepatellar bursitis, left knee: Secondary | ICD-10-CM | POA: Diagnosis not present

## 2020-05-09 DIAGNOSIS — L03115 Cellulitis of right lower limb: Secondary | ICD-10-CM | POA: Diagnosis not present

## 2020-05-09 DIAGNOSIS — D649 Anemia, unspecified: Secondary | ICD-10-CM | POA: Diagnosis not present

## 2020-05-09 DIAGNOSIS — S81001D Unspecified open wound, right knee, subsequent encounter: Secondary | ICD-10-CM | POA: Diagnosis not present

## 2020-05-09 DIAGNOSIS — B9561 Methicillin susceptible Staphylococcus aureus infection as the cause of diseases classified elsewhere: Secondary | ICD-10-CM | POA: Diagnosis not present

## 2020-05-09 DIAGNOSIS — S81002D Unspecified open wound, left knee, subsequent encounter: Secondary | ICD-10-CM | POA: Diagnosis not present

## 2020-05-09 DIAGNOSIS — M7041 Prepatellar bursitis, right knee: Secondary | ICD-10-CM | POA: Diagnosis not present

## 2020-05-09 DIAGNOSIS — Y9389 Activity, other specified: Secondary | ICD-10-CM | POA: Diagnosis not present

## 2020-05-09 DIAGNOSIS — B966 Bacteroides fragilis [B. fragilis] as the cause of diseases classified elsewhere: Secondary | ICD-10-CM | POA: Diagnosis not present

## 2020-05-09 DIAGNOSIS — T8133XD Disruption of traumatic injury wound repair, subsequent encounter: Secondary | ICD-10-CM | POA: Diagnosis not present

## 2020-05-09 DIAGNOSIS — L03116 Cellulitis of left lower limb: Secondary | ICD-10-CM | POA: Diagnosis not present

## 2020-05-11 DIAGNOSIS — L03115 Cellulitis of right lower limb: Secondary | ICD-10-CM | POA: Diagnosis not present

## 2020-05-11 DIAGNOSIS — B9561 Methicillin susceptible Staphylococcus aureus infection as the cause of diseases classified elsewhere: Secondary | ICD-10-CM | POA: Diagnosis not present

## 2020-05-11 DIAGNOSIS — D72829 Elevated white blood cell count, unspecified: Secondary | ICD-10-CM | POA: Diagnosis not present

## 2020-05-11 DIAGNOSIS — B951 Streptococcus, group B, as the cause of diseases classified elsewhere: Secondary | ICD-10-CM | POA: Diagnosis not present

## 2020-05-11 DIAGNOSIS — T8133XD Disruption of traumatic injury wound repair, subsequent encounter: Secondary | ICD-10-CM | POA: Diagnosis not present

## 2020-05-11 DIAGNOSIS — S81001D Unspecified open wound, right knee, subsequent encounter: Secondary | ICD-10-CM | POA: Diagnosis not present

## 2020-05-11 DIAGNOSIS — L03116 Cellulitis of left lower limb: Secondary | ICD-10-CM | POA: Diagnosis not present

## 2020-05-11 DIAGNOSIS — M7041 Prepatellar bursitis, right knee: Secondary | ICD-10-CM | POA: Diagnosis not present

## 2020-05-11 DIAGNOSIS — B966 Bacteroides fragilis [B. fragilis] as the cause of diseases classified elsewhere: Secondary | ICD-10-CM | POA: Diagnosis not present

## 2020-05-11 DIAGNOSIS — M7042 Prepatellar bursitis, left knee: Secondary | ICD-10-CM | POA: Diagnosis not present

## 2020-05-11 DIAGNOSIS — S81002D Unspecified open wound, left knee, subsequent encounter: Secondary | ICD-10-CM | POA: Diagnosis not present

## 2020-05-11 DIAGNOSIS — D649 Anemia, unspecified: Secondary | ICD-10-CM | POA: Diagnosis not present

## 2020-05-11 DIAGNOSIS — Y9389 Activity, other specified: Secondary | ICD-10-CM | POA: Diagnosis not present

## 2020-05-12 DIAGNOSIS — M7041 Prepatellar bursitis, right knee: Secondary | ICD-10-CM | POA: Diagnosis not present

## 2020-05-12 DIAGNOSIS — S81002D Unspecified open wound, left knee, subsequent encounter: Secondary | ICD-10-CM | POA: Diagnosis not present

## 2020-05-12 DIAGNOSIS — B966 Bacteroides fragilis [B. fragilis] as the cause of diseases classified elsewhere: Secondary | ICD-10-CM | POA: Diagnosis not present

## 2020-05-12 DIAGNOSIS — B951 Streptococcus, group B, as the cause of diseases classified elsewhere: Secondary | ICD-10-CM | POA: Diagnosis not present

## 2020-05-12 DIAGNOSIS — Y9389 Activity, other specified: Secondary | ICD-10-CM | POA: Diagnosis not present

## 2020-05-12 DIAGNOSIS — M7042 Prepatellar bursitis, left knee: Secondary | ICD-10-CM | POA: Diagnosis not present

## 2020-05-12 DIAGNOSIS — D72829 Elevated white blood cell count, unspecified: Secondary | ICD-10-CM | POA: Diagnosis not present

## 2020-05-12 DIAGNOSIS — S81001D Unspecified open wound, right knee, subsequent encounter: Secondary | ICD-10-CM | POA: Diagnosis not present

## 2020-05-12 DIAGNOSIS — T8133XD Disruption of traumatic injury wound repair, subsequent encounter: Secondary | ICD-10-CM | POA: Diagnosis not present

## 2020-05-12 DIAGNOSIS — D649 Anemia, unspecified: Secondary | ICD-10-CM | POA: Diagnosis not present

## 2020-05-12 DIAGNOSIS — L03116 Cellulitis of left lower limb: Secondary | ICD-10-CM | POA: Diagnosis not present

## 2020-05-12 DIAGNOSIS — B9561 Methicillin susceptible Staphylococcus aureus infection as the cause of diseases classified elsewhere: Secondary | ICD-10-CM | POA: Diagnosis not present

## 2020-05-12 DIAGNOSIS — L03115 Cellulitis of right lower limb: Secondary | ICD-10-CM | POA: Diagnosis not present

## 2020-05-16 DIAGNOSIS — M7042 Prepatellar bursitis, left knee: Secondary | ICD-10-CM | POA: Diagnosis not present

## 2020-05-16 DIAGNOSIS — D649 Anemia, unspecified: Secondary | ICD-10-CM | POA: Diagnosis not present

## 2020-05-16 DIAGNOSIS — L03116 Cellulitis of left lower limb: Secondary | ICD-10-CM | POA: Diagnosis not present

## 2020-05-16 DIAGNOSIS — T8133XD Disruption of traumatic injury wound repair, subsequent encounter: Secondary | ICD-10-CM | POA: Diagnosis not present

## 2020-05-16 DIAGNOSIS — L03115 Cellulitis of right lower limb: Secondary | ICD-10-CM | POA: Diagnosis not present

## 2020-05-16 DIAGNOSIS — M7041 Prepatellar bursitis, right knee: Secondary | ICD-10-CM | POA: Diagnosis not present

## 2020-05-16 DIAGNOSIS — B966 Bacteroides fragilis [B. fragilis] as the cause of diseases classified elsewhere: Secondary | ICD-10-CM | POA: Diagnosis not present

## 2020-05-16 DIAGNOSIS — B9561 Methicillin susceptible Staphylococcus aureus infection as the cause of diseases classified elsewhere: Secondary | ICD-10-CM | POA: Diagnosis not present

## 2020-05-16 DIAGNOSIS — D72829 Elevated white blood cell count, unspecified: Secondary | ICD-10-CM | POA: Diagnosis not present

## 2020-05-16 DIAGNOSIS — Y9389 Activity, other specified: Secondary | ICD-10-CM | POA: Diagnosis not present

## 2020-05-16 DIAGNOSIS — S81001D Unspecified open wound, right knee, subsequent encounter: Secondary | ICD-10-CM | POA: Diagnosis not present

## 2020-05-16 DIAGNOSIS — B951 Streptococcus, group B, as the cause of diseases classified elsewhere: Secondary | ICD-10-CM | POA: Diagnosis not present

## 2020-05-16 DIAGNOSIS — S81002D Unspecified open wound, left knee, subsequent encounter: Secondary | ICD-10-CM | POA: Diagnosis not present

## 2020-05-18 DIAGNOSIS — M7041 Prepatellar bursitis, right knee: Secondary | ICD-10-CM | POA: Diagnosis not present

## 2020-05-18 DIAGNOSIS — B9561 Methicillin susceptible Staphylococcus aureus infection as the cause of diseases classified elsewhere: Secondary | ICD-10-CM | POA: Diagnosis not present

## 2020-05-18 DIAGNOSIS — B951 Streptococcus, group B, as the cause of diseases classified elsewhere: Secondary | ICD-10-CM | POA: Diagnosis not present

## 2020-05-18 DIAGNOSIS — B966 Bacteroides fragilis [B. fragilis] as the cause of diseases classified elsewhere: Secondary | ICD-10-CM | POA: Diagnosis not present

## 2020-05-18 DIAGNOSIS — M25661 Stiffness of right knee, not elsewhere classified: Secondary | ICD-10-CM | POA: Diagnosis not present

## 2020-05-18 DIAGNOSIS — Y9389 Activity, other specified: Secondary | ICD-10-CM | POA: Diagnosis not present

## 2020-05-18 DIAGNOSIS — M7042 Prepatellar bursitis, left knee: Secondary | ICD-10-CM | POA: Diagnosis not present

## 2020-05-18 DIAGNOSIS — D649 Anemia, unspecified: Secondary | ICD-10-CM | POA: Diagnosis not present

## 2020-05-18 DIAGNOSIS — S81002D Unspecified open wound, left knee, subsequent encounter: Secondary | ICD-10-CM | POA: Diagnosis not present

## 2020-05-18 DIAGNOSIS — T8133XD Disruption of traumatic injury wound repair, subsequent encounter: Secondary | ICD-10-CM | POA: Diagnosis not present

## 2020-05-18 DIAGNOSIS — S81001D Unspecified open wound, right knee, subsequent encounter: Secondary | ICD-10-CM | POA: Diagnosis not present

## 2020-05-18 DIAGNOSIS — R278 Other lack of coordination: Secondary | ICD-10-CM | POA: Diagnosis not present

## 2020-05-18 DIAGNOSIS — M25561 Pain in right knee: Secondary | ICD-10-CM | POA: Diagnosis not present

## 2020-05-18 DIAGNOSIS — L03115 Cellulitis of right lower limb: Secondary | ICD-10-CM | POA: Diagnosis not present

## 2020-05-18 DIAGNOSIS — M25562 Pain in left knee: Secondary | ICD-10-CM | POA: Diagnosis not present

## 2020-05-18 DIAGNOSIS — L03116 Cellulitis of left lower limb: Secondary | ICD-10-CM | POA: Diagnosis not present

## 2020-05-18 DIAGNOSIS — D72829 Elevated white blood cell count, unspecified: Secondary | ICD-10-CM | POA: Diagnosis not present

## 2020-05-19 ENCOUNTER — Encounter: Payer: Self-pay | Admitting: Internal Medicine

## 2020-05-19 ENCOUNTER — Other Ambulatory Visit: Payer: Self-pay

## 2020-05-19 ENCOUNTER — Telehealth (INDEPENDENT_AMBULATORY_CARE_PROVIDER_SITE_OTHER): Payer: BC Managed Care – PPO | Admitting: Internal Medicine

## 2020-05-19 DIAGNOSIS — S81002D Unspecified open wound, left knee, subsequent encounter: Secondary | ICD-10-CM | POA: Diagnosis not present

## 2020-05-19 DIAGNOSIS — M71162 Other infective bursitis, left knee: Secondary | ICD-10-CM

## 2020-05-19 DIAGNOSIS — T8130XA Disruption of wound, unspecified, initial encounter: Secondary | ICD-10-CM | POA: Diagnosis not present

## 2020-05-19 DIAGNOSIS — B9561 Methicillin susceptible Staphylococcus aureus infection as the cause of diseases classified elsewhere: Secondary | ICD-10-CM | POA: Diagnosis not present

## 2020-05-19 DIAGNOSIS — T8133XD Disruption of traumatic injury wound repair, subsequent encounter: Secondary | ICD-10-CM | POA: Diagnosis not present

## 2020-05-19 DIAGNOSIS — M71161 Other infective bursitis, right knee: Secondary | ICD-10-CM | POA: Diagnosis not present

## 2020-05-19 DIAGNOSIS — M069 Rheumatoid arthritis, unspecified: Secondary | ICD-10-CM | POA: Diagnosis not present

## 2020-05-19 DIAGNOSIS — L03115 Cellulitis of right lower limb: Secondary | ICD-10-CM | POA: Diagnosis not present

## 2020-05-19 DIAGNOSIS — B966 Bacteroides fragilis [B. fragilis] as the cause of diseases classified elsewhere: Secondary | ICD-10-CM | POA: Diagnosis not present

## 2020-05-19 DIAGNOSIS — M7042 Prepatellar bursitis, left knee: Secondary | ICD-10-CM | POA: Diagnosis not present

## 2020-05-19 DIAGNOSIS — L03116 Cellulitis of left lower limb: Secondary | ICD-10-CM | POA: Diagnosis not present

## 2020-05-19 DIAGNOSIS — D649 Anemia, unspecified: Secondary | ICD-10-CM | POA: Diagnosis not present

## 2020-05-19 DIAGNOSIS — D72829 Elevated white blood cell count, unspecified: Secondary | ICD-10-CM | POA: Diagnosis not present

## 2020-05-19 DIAGNOSIS — B951 Streptococcus, group B, as the cause of diseases classified elsewhere: Secondary | ICD-10-CM | POA: Diagnosis not present

## 2020-05-19 DIAGNOSIS — Y9389 Activity, other specified: Secondary | ICD-10-CM | POA: Diagnosis not present

## 2020-05-19 DIAGNOSIS — M7041 Prepatellar bursitis, right knee: Secondary | ICD-10-CM | POA: Diagnosis not present

## 2020-05-19 DIAGNOSIS — S81001D Unspecified open wound, right knee, subsequent encounter: Secondary | ICD-10-CM | POA: Diagnosis not present

## 2020-05-19 DIAGNOSIS — M71169 Other infective bursitis, unspecified knee: Secondary | ICD-10-CM

## 2020-05-19 NOTE — Assessment & Plan Note (Signed)
Wound is healing and she is on Ensure to help improve protein intake.

## 2020-05-19 NOTE — Progress Notes (Signed)
   Subjective:    Patient ID: Morgan [name], female    DOB: 1951/07/30, 69 y.o.   MRN: [id number]  I connected with  Morgan [name] on 05/19/20 by a video enabled telemedicine application and verified that I am speaking with the correct person using two identifiers.   I discussed the limitations of evaluation and management by telemedicine. The patient expressed understanding and agreed to proceed.   HPI Video visit for hsfu She has a history of RA and psoriatic arthritis and unfortunatley fell on both knees.  She developed an open wound on both sides and required stitches.  She was placed on empiric antibiotics but required debridement by orthopedics.  Cultures with mutiple organisms including MSSA, GBS and Prevotella.  She was on amp/sulbatam and then transitioned to Augmentin at discharge.   She is doing relatively well though reports the wound is slow to heal.  Most stitches have been removed.  Some minimal serosanguinous discharge.  Some mild erythema.  She completed Augmentin last Monday, 6/7.  Tolerated well.   Review of Systems  Constitutional: Negative for chills and fever.  Musculoskeletal:       No drainage from knee       Objective:   Physical Exam Constitutional:      Appearance: Normal appearance.  Eyes:     General: No scleral icterus. Neurological:     Mental Status: She is alert.           Assessment & Plan:

## 2020-05-19 NOTE — Assessment & Plan Note (Signed)
She has responded to antibiotics and doing well now, though slow to heal as is expected in someone with RA.  She has done well from and ID standpoint after finishing the antibiotics so no further indication for antibiotic treatment.  She follows up weekly with orthopedics.  She can rtc as needed, she knows to call for any concerns.

## 2020-05-23 DIAGNOSIS — M25661 Stiffness of right knee, not elsewhere classified: Secondary | ICD-10-CM | POA: Insufficient documentation

## 2020-05-23 DIAGNOSIS — R2689 Other abnormalities of gait and mobility: Secondary | ICD-10-CM | POA: Insufficient documentation

## 2020-05-23 LAB — FUNGUS CULTURE WITH STAIN

## 2020-05-23 LAB — FUNGAL ORGANISM REFLEX

## 2020-05-23 LAB — FUNGUS CULTURE RESULT

## 2020-05-25 LAB — FUNGUS CULTURE WITH STAIN

## 2020-05-25 LAB — FUNGUS CULTURE RESULT

## 2020-05-25 LAB — FUNGAL ORGANISM REFLEX

## 2020-05-26 ENCOUNTER — Encounter: Payer: Self-pay | Admitting: Internal Medicine

## 2020-05-26 ENCOUNTER — Ambulatory Visit: Payer: BC Managed Care – PPO | Admitting: Internal Medicine

## 2020-05-26 ENCOUNTER — Telehealth: Payer: Self-pay

## 2020-05-26 ENCOUNTER — Other Ambulatory Visit: Payer: Self-pay

## 2020-05-26 VITALS — BP 119/52 | HR 70 | Temp 97.1°F | Ht 64.0 in | Wt 211.0 lb

## 2020-05-26 DIAGNOSIS — S81002A Unspecified open wound, left knee, initial encounter: Secondary | ICD-10-CM | POA: Diagnosis not present

## 2020-05-26 DIAGNOSIS — T8130XA Disruption of wound, unspecified, initial encounter: Secondary | ICD-10-CM | POA: Diagnosis not present

## 2020-05-26 MED ORDER — CEPHALEXIN 500 MG PO CAPS: 500.0000 mg | ORAL_CAPSULE | Freq: Four times a day (QID) | ORAL | 0 refills | Status: AC

## 2020-05-26 MED ORDER — DOXYCYCLINE HYCLATE 100 MG PO TABS
100.0000 mg | ORAL_TABLET | Freq: Two times a day (BID) | ORAL | 0 refills | Status: AC
Start: 2020-05-26 — End: 2020-06-02

## 2020-05-26 NOTE — Progress Notes (Addendum)
Subjective:     Patient ID: Morgan [name], female    DOB: Nov 06, 1951, 69 y.o.   MRN: [id number]  Chief Complaint  Patient presents with  . left knee wound    HPI: The patient is a 69 y.o. female with history of rheumatoid arthritis and psoriatic arthritis here for left knee wound  Patient states that on April 16th she had fallen and hit both her knees on the kitchen floor.  Her left knee had a laceration and had stiches in place.  Over the following couple of months both knees became infected and required surgical debridement.  Recently she had stiches removed from her left knee and the wound split open.  She was referred by Dr. Theda Sers for wound care.  She is currently doing wet to dry dressings. Currently patient states she has mild/moderate pain in the surrounding tissue of her left knee.  She has been using an immobilizer to help keep from having more skin tear to the left knee wound.  She denies fever/chills or purulent drainage.  She has erythema and warmth to the left knee.   Review of Systems  All other systems reviewed and are negative.    has a past medical history of Abnormal chest x-ray, Abnormal electrocardiogram, Abscess of sigmoid colon due to diverticulitis (11/04/2017), Allergic rhinitis, Allergy, Cancer ([place]), Cataract, Colon polyp, Complication of anesthesia, Contact dermatitis due to poison ivy, Cystitis, Diverticulosis of colon, Fatty liver disease, nonalcoholic, GERD (gastroesophageal reflux disease), Hearing loss, Hemorrhoids, Hyperlipidemia, Hypertension, Lymphocytosis, Overweight(278.02), Pneumonia, PONV (postoperative nausea and vomiting), RA (rheumatoid arthritis) ([place]), and Venous insufficiency.  has a past surgical history that includes Total abdominal hysterectomy (12/86); Inner ear surgery (Right); Nasal septum surgery; Foot surgery; chemical and laser endovenous ablation of LE veins (2008, 2009, 2010, 2011); Cesarean section; Proctoscopy (N/A, 12/11/2017);  Laparoscopic lysis of adhesions (N/A, 12/11/2017); Tympanoplasty (Right, 11/03/2018); Colonoscopy; Polypectomy; Reverse shoulder arthroplasty (Right, 09/10/2019); Incision and drainage (Bilateral, 04/22/2020); Incision and drainage abscess (Left, 04/24/2020); and Dressing change under anesthesia (Right, 04/24/2020).  reports that she quit smoking about 18 years ago. Her smoking use included cigarettes. She has a 7.50 pack-year smoking history. She has never used smokeless tobacco. Objective:   Vital Signs BP (!) 119/52 (BP Location: Left Arm, Patient Position: Sitting, Cuff Size: Large)   Pulse 70   Temp (!) 97.1 F (36.2 C) (Temporal)   Ht 5\' 4"  (1.626 m)   Wt 211 lb (95.7 kg)   SpO2 100%   BMI 36.22 kg/m  Vital Signs and Nursing Note Reviewed Physical Exam Skin:    Comments: Left knee wound measures: 3.0 x 3.5 x 0.2 cm Tendon exposed Erythema and warmth to the left knee     Assessment/Plan:     ICD-10-CM   1. Open knee wound, left, initial encounter  S81.002A   2. Wound dehiscence  T81.30XA    Assessment:  Left open knee wound status post debridement due to septic prepatellar bursitis following traumatic fall.  Patient presents today with an open wound and surrounding tissue appears infected.  She is currently taking doxycycline.  I recommended continuing this for one more week and I will add keflex to the regimen.   The wound area was cleaned with gauze and wound cleanser.  The tissue/tendon exposed appears healthy.  I will start collagen daily dressings and have supplies requested through [facility].  Will discuss case with colleague Dr. Claudia Desanctis for further potential surgical recommendations.  The wound will be very slow to heal  due to hx of RA.  Plan -doxycycline and keflex -wound cleaned/debrided with gauze and wound cleanser -collagen dressing with non stick pad, kerlix and ace - follow up next week  I spent 45 minutes face to face with the patient. Greater than 50% of the time was  spent on counseling and coordination of care, which is detailed in the A&P.   [name], DO 05/26/2020, 3:54 PM

## 2020-05-26 NOTE — Telephone Encounter (Signed)
Faxed [facility] order for Dr. Heber [place]: Morgan [name], Fibracol collagen-daily and telfa pads-daily

## 2020-05-26 NOTE — Patient Instructions (Signed)
[name] It was a pleasure meeting you today.  Please follow the instructions below for your wound care.  Also please start taking doxycycline and keflex for your skin infection.  1) please clean the wound with normal saline 2) collagen dressing changes daily 3) followed by non stick pad 4) wrap with gauze 5) cover with ace bandage as needed  Please follow up with me in 1 weeks.  Call us at [phone] with any questions or concerns  [employer] is a medical supply company.  We have sent them an order for your supplies.  Please contact them if you do not receive your supplies in the next 72hrs.  Their number is [phone] and website is [email or web address]

## 2020-05-27 DIAGNOSIS — S81002A Unspecified open wound, left knee, initial encounter: Secondary | ICD-10-CM | POA: Diagnosis not present

## 2020-05-29 DIAGNOSIS — L0390 Cellulitis, unspecified: Secondary | ICD-10-CM | POA: Diagnosis not present

## 2020-05-29 DIAGNOSIS — M71169 Other infective bursitis, unspecified knee: Secondary | ICD-10-CM | POA: Diagnosis not present

## 2020-05-29 DIAGNOSIS — T8130XA Disruption of wound, unspecified, initial encounter: Secondary | ICD-10-CM | POA: Diagnosis not present

## 2020-05-30 ENCOUNTER — Telehealth: Payer: Self-pay | Admitting: Internal Medicine

## 2020-05-30 NOTE — Telephone Encounter (Signed)
Please call patient and advise she should remove previous collagen dressing before applying a new one.  Thank you

## 2020-05-30 NOTE — Telephone Encounter (Signed)
Returned patients call. Advised to remove the collagen daily and replace with a new sheet. Patient indicated she has not removed any since Thursday and has been applying additional sheets on top of wound. Instructed her to rinse the wound out with saline and try to get the old collagen out.  Then replace with a new sheet of collagen, telfa pad and wrap with kerlix, then ace wrap. Patient was concerned of the of the old product wound not coming out.

## 2020-05-30 NOTE — Telephone Encounter (Signed)
Patient called to follow up on question she had on Friday regarding her dressing changes. She would like to know if they should leave remaining collagen and build on it each time they change the dressing or if they should remove the collagen. She mentioned she spoke to someone on call on Friday and they were going to send a message to Dr. Heber [place].  Please call her to advise [phone].

## 2020-05-31 ENCOUNTER — Other Ambulatory Visit: Payer: Self-pay

## 2020-05-31 ENCOUNTER — Encounter: Payer: Self-pay | Admitting: Family Medicine

## 2020-05-31 ENCOUNTER — Ambulatory Visit (INDEPENDENT_AMBULATORY_CARE_PROVIDER_SITE_OTHER): Payer: BC Managed Care – PPO | Admitting: Family Medicine

## 2020-05-31 ENCOUNTER — Ambulatory Visit: Payer: BC Managed Care – PPO | Admitting: Internal Medicine

## 2020-05-31 VITALS — BP 118/62 | HR 67 | Temp 97.6°F | Ht 64.0 in | Wt 215.0 lb

## 2020-05-31 DIAGNOSIS — M0579 Rheumatoid arthritis with rheumatoid factor of multiple sites without organ or systems involvement: Secondary | ICD-10-CM

## 2020-05-31 DIAGNOSIS — E782 Mixed hyperlipidemia: Secondary | ICD-10-CM

## 2020-05-31 DIAGNOSIS — T8130XA Disruption of wound, unspecified, initial encounter: Secondary | ICD-10-CM | POA: Diagnosis not present

## 2020-05-31 DIAGNOSIS — I10 Essential (primary) hypertension: Secondary | ICD-10-CM

## 2020-05-31 DIAGNOSIS — M71162 Other infective bursitis, left knee: Secondary | ICD-10-CM

## 2020-05-31 MED ORDER — NEOMYCIN-POLYMYXIN-HC 1 % OT SOLN: 3.0000 [drp] | Freq: Four times a day (QID) | OTIC | 0 refills | Status: DC

## 2020-05-31 NOTE — Progress Notes (Signed)
Subjective  CC:  Chief Complaint  Patient presents with  . Hypertension    has been checking at home and other dr appointments has been runing around 118/60 to 120/70    HPI: Morgan [name] is a 69 y.o. female who presents to the office today to address the problems listed above in the chief complaint.  Hypertension f/u: Control is good . Pt reports she is doing well. taking medications as instructed, no medication side effects noted, no TIAs, no chest pain on exertion, no dyspnea on exertion, no swelling of ankles. She denies adverse effects from his BP medications. Compliance with medication is good.   Seeing plastics now for wound dehiscence after long course of worsening prepatellar bursitis. On abx x 2 months. I have reviewed hospital records and notes and treatment plans. She is frustrated but coping. Physically feels ok but in a knee immobilizer so difficult to do anything. Sleeping in a recliner  Psoriatic RA: off meds due to chronic infection and feels that she is about to break out with psoriatic patches.   GERD is ok.   HLD at goal in December on statin.   Ros: c/o left ear pain that started today. No f/c/s or cold sxs. Wears hearing aids.  Assessment  1. Wound dehiscence   2. Essential hypertension   3. Mixed hyperlipidemia   4. Rheumatoid arthritis involving multiple sites with positive rheumatoid factor ([place])   5. Septic prepatellar bursitis of left knee      Plan    Hypertension f/u: BP control is well controlled. Continue same meds  Hyperlipidemia f/u: continue statin  Prepatellar bursitis and wound dehiscence: wound care and plastics.   Off immunosuppressants.   Ear pain: ? Early otitis externa: monitor and start ABX drops if worsens. Consider fungal infection as well.  Education regarding management of these chronic disease states was given. Management strategies discussed on successive visits include dietary and exercise recommendations, goals of  achieving and maintaining IBW, and lifestyle modifications aiming for adequate sleep and minimizing stressors.   Follow up: Return in about 6 months (around 11/30/2020) for complete physical.  No orders of the defined types were placed in this encounter.  Meds ordered this encounter  Medications  . NEOMYCIN-POLYMYXIN-HYDROCORTISONE (CORTISPORIN) 1 % SOLN OTIC solution    Sig: Place 3 drops into the left ear 4 (four) times daily.    Dispense:  10 mL    Refill:  0      BP Readings from Last 3 Encounters:  05/31/20 118/62  05/26/20 (!) 119/52  04/28/20 (!) 146/58   Wt Readings from Last 3 Encounters:  05/31/20 215 lb (97.5 kg)  05/26/20 211 lb (95.7 kg)  04/28/20 220 lb 3.8 oz (99.9 kg)    Lab Results  Component Value Date   CHOL 138 12/01/2019   CHOL 157 12/08/2018   CHOL 139 12/09/2017   Lab Results  Component Value Date   HDL 49.10 12/01/2019   HDL 66.80 12/08/2018   HDL 44.60 12/09/2017   Lab Results  Component Value Date   LDLCALC 66 12/01/2019   LDLCALC 72 12/08/2018   LDLCALC 70 12/09/2017   Lab Results  Component Value Date   TRIG 111.0 12/01/2019   TRIG 90.0 12/08/2018   TRIG 121.0 12/09/2017   Lab Results  Component Value Date   CHOLHDL 3 12/01/2019   CHOLHDL 2 12/08/2018   CHOLHDL 3 12/09/2017   Lab Results  Component Value Date   LDLDIRECT 100.9 05/28/2007  LDLDIRECT 109.2 01/28/2007   Lab Results  Component Value Date   CREATININE 0.78 04/26/2020   BUN 23 04/26/2020   NA 141 04/26/2020   K 3.8 04/26/2020   CL 107 04/26/2020   CO2 28 04/26/2020    The 10-year ASCVD risk score Mikey Bussing DC Jr., et al., 2013) is: 11.1%   Values used to calculate the score:     Age: 89 years     Sex: Female     Is Non-Hispanic African American: No     Diabetic: No     Tobacco smoker: No     Systolic Blood Pressure: 361 mmHg     Is BP treated: Yes     HDL Cholesterol: 49.1 mg/dL     Total Cholesterol: 138 mg/dL  I reviewed the patients updated PMH,  FH, and SocHx.    Patient Active Problem List   Diagnosis Date Noted  . Poor balance 05/23/2020  . Septic prepatellar bursitis 04/27/2020  . Cellulitis 04/27/2020  . Wound dehiscence 04/21/2020  . 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 Bilateral sensorineural hearing loss 02/02/2019  . Hypercalcemia 12/11/2018  . H/O partial resection of colon 01/31/2018  . Diverticulitis with abscess status post robotic low anterior rectosigmoid resection 12/11/2017 12/11/2017  . Rheumatoid arthritis involving multiple sites with positive rheumatoid factor ([place]) 10/31/2017  . Immunosuppressed status ([place]) 10/08/2017  . Obesity with body mass index 30 or greater 01/25/2017  . Current chronic use of systemic steroids 06/08/2016  . Uses hearing aid 12/09/2015  . Leukocytosis 08/27/2014  . Thrombocytopenia ([place]) 11/23/2013  . Fatty liver disease, nonalcoholic 44/31/5400  . Benign colon polyp 10/20/2008  . Essential hypertension 10/20/2008  . Diverticulosis of colon 10/20/2008  . Mixed hyperlipidemia 04/23/2008  . Venous (peripheral) insufficiency 04/23/2008  . Allergic rhinitis 04/23/2008  . Gastroesophageal reflux disease without esophagitis 04/23/2008    Allergies: Remicade [infliximab]  Social History: Patient  reports that she quit smoking about 18 years ago. Her smoking use included cigarettes. She has a 7.50 pack-year smoking history. She has never used smokeless tobacco. She reports current alcohol use. She reports that she does not use drugs.  Current Meds  Medication Sig  . acetaminophen (TYLENOL) 500 MG tablet Take 500 mg by mouth every 6 (six) hours as needed for moderate pain or headache.   . Calcium Carbonate-Vitamin D 600-400 MG-UNIT tablet Take 1 tablet by mouth 2 (two) times daily.   . cephALEXin (KEFLEX) 500 MG capsule Take 1 capsule (500 mg total) by mouth 4 (four) times daily for 7 days.  . Cholecalciferol (VITAMIN D) 50 MCG (2000 UT) CAPS Take 2,000 Units  by mouth daily.   Mariane Baumgarten Sodium (COLACE PO) Take by mouth.  . doxepin (SINEQUAN) 10 MG capsule Take 10 mg by mouth 2 (two) times daily.   Marland Kitchen doxycycline (VIBRA-TABS) 100 MG tablet Take 1 tablet (100 mg total) by mouth 2 (two) times daily for 7 days.  . fenofibrate 160 MG tablet TAKE 1 TABLET (160 MG TOTAL) BY MOUTH AT BEDTIME.  . fexofenadine (ALLEGRA) 180 MG tablet Take 180 mg by mouth daily.  . fluticasone (FLONASE) 50 MCG/ACT nasal spray Place 2 sprays into both nostrils daily as needed for allergies.  . hydrochlorothiazide (HYDRODIURIL) 25 MG tablet TAKE 1 TABLET BY MOUTH EVERY DAY  . hydroxychloroquine (PLAQUENIL) 200 MG tablet Hold. Please discuss with your rheumatologist prior to restarting.  . Hypromellose (ARTIFICIAL TEARS OP) Place 1 drop into both eyes daily as needed (  for dry eyes).  . Multiple Vitamin (MULTIVITAMIN) tablet Take 1 tablet by mouth daily.    . naproxen sodium (ALEVE) 220 MG tablet Take 220 mg by mouth daily as needed (pain).  . predniSONE (DELTASONE) 5 MG tablet Take 5 mg by mouth daily.  . simvastatin (ZOCOR) 40 MG tablet TAKE 1 TABLET BY MOUTH EVERYDAY AT BEDTIME (Patient taking differently: Take 40 mg by mouth every evening. )    Review of Systems: Cardiovascular: negative for chest pain, palpitations, leg swelling, orthopnea Respiratory: negative for SOB, wheezing or persistent cough Gastrointestinal: negative for abdominal pain Genitourinary: negative for dysuria or gross hematuria  Objective  Vitals: BP 118/62   Pulse 67   Temp 97.6 F (36.4 C) (Temporal)   Ht 5\' 4"  (1.626 m)   Wt 215 lb (97.5 kg)   SpO2 98%   BMI 36.90 kg/m  General: no acute distress , in wheel chair and knee immobilizer Psych:  Alert and oriented, normal mood and affect, TMs with white exudate and nl canals HEENT:  Normocephalic, atraumatic, supple neck  Cardiovascular:  RRR without murmur. no edema Respiratory:  Good breath sounds bilaterally, CTAB with normal respiratory  effort  Commons side effects, risks, benefits, and alternatives for medications and treatment plan prescribed today were discussed, and the patient expressed understanding of the given instructions. Patient is instructed to call or message via MyChart if he/she has any questions or concerns regarding our treatment plan. No barriers to understanding were identified. We discussed Red Flag symptoms and signs in detail. Patient expressed understanding regarding what to do in case of urgent or emergency type symptoms.   Medication list was reconciled, printed and provided to the patient in AVS. Patient instructions and summary information was reviewed with the patient as documented in the AVS. This note was prepared with assistance of Dragon voice recognition software. Occasional wrong-word or sound-a-like substitutions may have occurred due to the inherent limitations of voice recognition software  This visit occurred during the SARS-CoV-2 public health emergency.  Safety protocols were in place, including screening questions prior to the visit, additional usage of staff PPE, and extensive cleaning of exam room while observing appropriate contact time as indicated for disinfecting solutions.

## 2020-05-31 NOTE — Patient Instructions (Signed)
Please return in 6 months for your annual complete physical; please come fasting.   If you have any questions or concerns, please don't hesitate to send me a message via MyChart or call the office at [phone]. Thank you for visiting with us today! It's our pleasure caring for you.  

## 2020-06-01 ENCOUNTER — Ambulatory Visit: Payer: BC Managed Care – PPO | Admitting: Internal Medicine

## 2020-06-01 ENCOUNTER — Encounter: Payer: Self-pay | Admitting: Internal Medicine

## 2020-06-01 VITALS — BP 134/62 | HR 77 | Temp 97.1°F

## 2020-06-01 DIAGNOSIS — S81002D Unspecified open wound, left knee, subsequent encounter: Secondary | ICD-10-CM

## 2020-06-01 DIAGNOSIS — T8130XA Disruption of wound, unspecified, initial encounter: Secondary | ICD-10-CM

## 2020-06-01 NOTE — Progress Notes (Addendum)
Subjective:     Patient ID: Morgan [name], female    DOB: 1951/03/10, 69 y.o.   MRN: [id number]  Chief Complaint  Patient presents with  . Follow-up    HPI: The patient is a 69 y.o. female here for with history of rheumatoid arthritis and psoriatic arthritis here for left knee wound.  Patient has been using collagen with daily dressing changes for the past week.  She is currently taking doxycycline and keflex.  She reports improvement to the skin surrounding the wound bed.  She also reports improvement in pain to the wound site.  She denies drainage, erythema or warmth to the left knee.    Review of Systems  All other systems reviewed and are negative.    has a past medical history of Abnormal chest x-ray, Abnormal electrocardiogram, Abscess of sigmoid colon due to diverticulitis (11/04/2017), Allergic rhinitis, Allergy, Cancer ([place]), Cataract, Colon polyp, Complication of anesthesia, Contact dermatitis due to poison ivy, Cystitis, Diverticulosis of colon, Fatty liver disease, nonalcoholic, GERD (gastroesophageal reflux disease), Hearing loss, Hemorrhoids, Hyperlipidemia, Hypertension, Lymphocytosis, Overweight(278.02), Pneumonia, PONV (postoperative nausea and vomiting), RA (rheumatoid arthritis) ([place]), and Venous insufficiency.  has a past surgical history that includes Total abdominal hysterectomy (12/86); Inner ear surgery (Right); Nasal septum surgery; Foot surgery; chemical and laser endovenous ablation of LE veins (2008, 2009, 2010, 2011); Cesarean section; Proctoscopy (N/A, 12/11/2017); Laparoscopic lysis of adhesions (N/A, 12/11/2017); Tympanoplasty (Right, 11/03/2018); Colonoscopy; Polypectomy; Reverse shoulder arthroplasty (Right, 09/10/2019); Incision and drainage (Bilateral, 04/22/2020); Incision and drainage abscess (Left, 04/24/2020); and Dressing change under anesthesia (Right, 04/24/2020).  reports that she quit smoking about 18 years ago. Her smoking use included cigarettes. She has a  7.50 pack-year smoking history. She has never used smokeless tobacco. Objective:   Vital Signs BP 134/62 (BP Location: Left Arm, Patient Position: Sitting, Cuff Size: Normal)   Pulse 77   Temp (!) 97.1 F (36.2 C) (Temporal)   SpO2 100%  Vital Signs and Nursing Note Reviewed Physical Exam Skin:    Comments: Left knee wound measures: 3.0 x 3.5 x 0.2 cm Tendon exposed No drainage No erythema or increased warmth to the left knee       Assessment/Plan:     ICD-10-CM   1. Open knee wound, left, subsequent encounter  S81.002D   2. Wound dehiscence  T81.30XA    Assessment:  Left open knee wound status post debridement due to septic prepatellar bursitis following traumatic fall.  The soft skin infection has improved and I recommended she finish her antibiotic course.  The wound is stable with no signs of infection.  Dr. Claudia Desanctis was present during the encounter and conservative wound care vs surgical skin graft was discussed.  The patient would like to proceed with OR debridement with skin substitute placement.  We will arrange for this.  In the meantime I recommended she continue using collagen dressing changes daily.  Plan -surgical debridement in OR with Dr. Claudia Desanctis -collagen dressing changes daily  I spent 35 minutes face to face with the patient. Greater than 50% of the time was spent on counseling and coordination of care, which is detailed in the A&P.   [name], DO 06/01/2020, 3:15 PM  I evaluated this patient today with Dr. Heber [place].  We discussed surgical and nonsurgical treatment of what is now a chronic left knee wound.  She has exposure of her patella.  We all agreed that moving forward with Integra placement with a possible follow-up of a skin graft  down the line is the best option.  I would plan to place a wound VAC and knee immobilizer at the time of surgery.  I discussed the risks and benefits with the patient and she is in agreement.

## 2020-06-01 NOTE — H&P (View-Only) (Signed)
Subjective:     Patient ID: Morgan [name], female    DOB: 1951/10/27, 69 y.o.   MRN: [id number]  Chief Complaint  Patient presents with  . Follow-up    HPI: The patient is a 69 y.o. female here for with history of rheumatoid arthritis and psoriatic arthritis here for left knee wound.  Patient has been using collagen with daily dressing changes for the past week.  She is currently taking doxycycline and keflex.  She reports improvement to the skin surrounding the wound bed.  She also reports improvement in pain to the wound site.  She denies drainage, erythema or warmth to the left knee.    Review of Systems  All other systems reviewed and are negative.    has a past medical history of Abnormal chest x-ray, Abnormal electrocardiogram, Abscess of sigmoid colon due to diverticulitis (11/04/2017), Allergic rhinitis, Allergy, Cancer ([place]), Cataract, Colon polyp, Complication of anesthesia, Contact dermatitis due to poison ivy, Cystitis, Diverticulosis of colon, Fatty liver disease, nonalcoholic, GERD (gastroesophageal reflux disease), Hearing loss, Hemorrhoids, Hyperlipidemia, Hypertension, Lymphocytosis, Overweight(278.02), Pneumonia, PONV (postoperative nausea and vomiting), RA (rheumatoid arthritis) ([place]), and Venous insufficiency.  has a past surgical history that includes Total abdominal hysterectomy (12/86); Inner ear surgery (Right); Nasal septum surgery; Foot surgery; chemical and laser endovenous ablation of LE veins (2008, 2009, 2010, 2011); Cesarean section; Proctoscopy (N/A, 12/11/2017); Laparoscopic lysis of adhesions (N/A, 12/11/2017); Tympanoplasty (Right, 11/03/2018); Colonoscopy; Polypectomy; Reverse shoulder arthroplasty (Right, 09/10/2019); Incision and drainage (Bilateral, 04/22/2020); Incision and drainage abscess (Left, 04/24/2020); and Dressing change under anesthesia (Right, 04/24/2020).  reports that she quit smoking about 18 years ago. Her smoking use included cigarettes. She has a  7.50 pack-year smoking history. She has never used smokeless tobacco. Objective:   Vital Signs BP 134/62 (BP Location: Left Arm, Patient Position: Sitting, Cuff Size: Normal)   Pulse 77   Temp (!) 97.1 F (36.2 C) (Temporal)   SpO2 100%  Vital Signs and Nursing Note Reviewed Physical Exam Skin:    Comments: Left knee wound measures: 3.0 x 3.5 x 0.2 cm Tendon exposed No drainage No erythema or increased warmth to the left knee       Assessment/Plan:     ICD-10-CM   1. Open knee wound, left, subsequent encounter  S81.002D   2. Wound dehiscence  T81.30XA    Assessment:  Left open knee wound status post debridement due to septic prepatellar bursitis following traumatic fall.  The soft skin infection has improved and I recommended she finish her antibiotic course.  The wound is stable with no signs of infection.  Dr. Claudia Desanctis was present during the encounter and conservative wound care vs surgical skin graft was discussed.  The patient would like to proceed with OR debridement with skin substitute placement.  We will arrange for this.  In the meantime I recommended she continue using collagen dressing changes daily.  Plan -surgical debridement in OR with Dr. Claudia Desanctis -collagen dressing changes daily  I spent 35 minutes face to face with the patient. Greater than 50% of the time was spent on counseling and coordination of care, which is detailed in the A&P.   [name], DO 06/01/2020, 3:15 PM  I evaluated this patient today with Dr. Heber [place].  We discussed surgical and nonsurgical treatment of what is now a chronic left knee wound.  She has exposure of her patella.  We all agreed that moving forward with Integra placement with a possible follow-up of a skin graft  down the line is the best option.  I would plan to place a wound VAC and knee immobilizer at the time of surgery.  I discussed the risks and benefits with the patient and she is in agreement.

## 2020-06-03 ENCOUNTER — Telehealth: Payer: Self-pay

## 2020-06-03 ENCOUNTER — Encounter: Payer: Self-pay | Admitting: Plastic Surgery

## 2020-06-03 NOTE — Telephone Encounter (Signed)
Order for wound vac & office notes/insurance information & demographics faxed to [facility]- attention: Shaneisha @ fax# [phone] per Dr. Claudia Desanctis for surgery on 06/09/20 I spoke to pt & she does not want to have homehealth scheduled to assist with wound vac dressing changes at this time- she & her husband state that they will be able to perform the dressing care- if instructed on how to change the dressing Their daughter is a Therapist, sports & they will also instruct her & she will assist with the care as well I did contact [place] with [employer] & she will come to the office on pt's post-op visit  (06/16/20) to educate them on the wound vac care Per Dr. Claudia Desanctis- the wound vac dressing will stay in place for one week post-op & can be changed for the 1st time in our office.  Pt is aware that the wound vac supplies will be shipped to her home prior to surgery- but if this does not occur d/t insurance approval or because of holiday weekend- then [facility] will ship supplies to our office- & Dr. Claudia Desanctis will take the supplies to the OR on the day of surgery Pt understands that she will bring the following supplies: vac machine with power cord, charger, (2) dressings & (1) cannister to surgery. Copy of orders scanned into chart & to [facility]

## 2020-06-04 NOTE — Telephone Encounter (Signed)
The patient is a 69 year old female with a left knee wound.  She called yesterday with concerns about increasing redness and swelling.  She had been on 2 antibiotics which are finishing up.  She is concerned about increase in infection.  The decision was made to continue with the Keflex and the doxycycline.  This was called into her pharmacy.  I will make Dr. Claudia Desanctis aware of the call and the treatment.  She should plan to keep her appointment with him for this coming week.  She is aware to give [place] a call back if she has any questions or concerns.  I also spoke to her today and she felt better about the situation and less redness around her knee was noted.

## 2020-06-06 LAB — ACID FAST CULTURE WITH REFLEXED SENSITIVITIES (MYCOBACTERIA)
Acid Fast Culture: NEGATIVE
Acid Fast Culture: NEGATIVE

## 2020-06-07 ENCOUNTER — Other Ambulatory Visit (HOSPITAL_COMMUNITY)
Admission: RE | Admit: 2020-06-07 | Discharge: 2020-06-07 | Disposition: A | Discharge status: Home / Self Care | Payer: BC Managed Care – PPO | Source: Ambulatory Visit | Attending: Plastic Surgery | Admitting: Plastic Surgery

## 2020-06-07 DIAGNOSIS — Z01812 Encounter for preprocedural laboratory examination: Secondary | ICD-10-CM | POA: Diagnosis not present

## 2020-06-07 DIAGNOSIS — Z20822 Contact with and (suspected) exposure to covid-19: Secondary | ICD-10-CM | POA: Insufficient documentation

## 2020-06-07 DIAGNOSIS — T8189XA Other complications of procedures, not elsewhere classified, initial encounter: Secondary | ICD-10-CM | POA: Diagnosis not present

## 2020-06-07 LAB — SARS CORONAVIRUS 2 (TAT 6-24 HRS): SARS Coronavirus 2: NEGATIVE

## 2020-06-08 ENCOUNTER — Encounter (HOSPITAL_COMMUNITY): Payer: Self-pay | Admitting: Plastic Surgery

## 2020-06-08 DIAGNOSIS — T8189XA Other complications of procedures, not elsewhere classified, initial encounter: Secondary | ICD-10-CM | POA: Diagnosis not present

## 2020-06-08 NOTE — Anesthesia Preprocedure Evaluation (Addendum)
Anesthesia Evaluation  Patient identified by MRN, date of birth, ID band Patient awake    Reviewed: Allergy & Precautions, NPO status , Patient's Chart, lab work & pertinent test results, reviewed documented beta blocker date and time   History of Anesthesia Complications (+) PONV and history of anesthetic complications  Airway Mallampati: II  TM Distance: >3 FB Neck ROM: Full    Dental  (+) Partial Lower, Caps   Pulmonary pneumonia, resolved, former smoker,    Pulmonary exam normal breath sounds clear to auscultation       Cardiovascular hypertension, Pt. on medications Normal cardiovascular exam Rhythm:Regular Rate:Normal     Neuro/Psych negative neurological ROS  negative psych ROS   GI/Hepatic Neg liver ROS, GERD  ,  Endo/Other  Obesity Hyperlipidemia   Renal/GU negative Renal ROS  negative genitourinary   Musculoskeletal  (+) Arthritis , Rheumatoid disorders,  Open wound left knee   Abdominal (+) + obese,   Peds  Hematology   Anesthesia Other Findings   Reproductive/Obstetrics                            Anesthesia Physical Anesthesia Plan  ASA: II  Anesthesia Plan: General   Post-op Pain Management:    Induction: Intravenous  PONV Risk Score and Plan: 4 or greater and Ondansetron and Treatment may vary due to age or medical condition  Airway Management Planned: LMA  Additional Equipment:   Intra-op Plan:   Post-operative Plan: Extubation in OR  Informed Consent: I have reviewed the patients History and Physical, chart, labs and discussed the procedure including the risks, benefits and alternatives for the proposed anesthesia with the patient or authorized representative who has indicated his/her understanding and acceptance.     Dental advisory given  Plan Discussed with: CRNA and Surgeon  Anesthesia Plan Comments:        Anesthesia Quick Evaluation

## 2020-06-08 NOTE — Progress Notes (Signed)
Patient denies shortness of breath, fever, cough or chest pain.  PCP - Dr Billey Chang Cardiologist - n/a RA - Dr. Gavin Pound Oncology - Dr Alen Blew  Chest x-ray - n/a EKG - DOS 06/09/20 Stress Test - 09/20/00 ECHO - n/a Cardiac Cath - n/a  Anesthesia review: Yes  STOP now taking any Aspirin (unless otherwise instructed by your surgeon), Aleve, Naproxen, Ibuprofen, Motrin, Advil, Goody's, BC's, all herbal medications, fish oil, and all vitamins.   Coronavirus Screening Covid test on 06/07/20 was negative.  Patient verbalized understanding of instructions that were given via phone.

## 2020-06-09 ENCOUNTER — Ambulatory Visit (HOSPITAL_COMMUNITY)
Admission: RE | Admit: 2020-06-09 | Discharge: 2020-06-09 | Disposition: A | Discharge status: Home / Self Care | Payer: BC Managed Care – PPO | Attending: Plastic Surgery | Admitting: Plastic Surgery

## 2020-06-09 ENCOUNTER — Ambulatory Visit (HOSPITAL_COMMUNITY): Payer: BC Managed Care – PPO | Admitting: Physician Assistant

## 2020-06-09 ENCOUNTER — Encounter (HOSPITAL_COMMUNITY): Payer: Self-pay | Admitting: Plastic Surgery

## 2020-06-09 ENCOUNTER — Encounter (HOSPITAL_COMMUNITY)
Admission: RE | Disposition: A | Discharge status: Home / Self Care | Payer: Self-pay | Source: Home / Self Care | Attending: Plastic Surgery

## 2020-06-09 ENCOUNTER — Other Ambulatory Visit: Payer: Self-pay

## 2020-06-09 DIAGNOSIS — E782 Mixed hyperlipidemia: Secondary | ICD-10-CM | POA: Diagnosis not present

## 2020-06-09 DIAGNOSIS — E785 Hyperlipidemia, unspecified: Secondary | ICD-10-CM | POA: Diagnosis not present

## 2020-06-09 DIAGNOSIS — Z90710 Acquired absence of both cervix and uterus: Secondary | ICD-10-CM | POA: Insufficient documentation

## 2020-06-09 DIAGNOSIS — D696 Thrombocytopenia, unspecified: Secondary | ICD-10-CM | POA: Diagnosis not present

## 2020-06-09 DIAGNOSIS — T8130XA Disruption of wound, unspecified, initial encounter: Secondary | ICD-10-CM | POA: Diagnosis not present

## 2020-06-09 DIAGNOSIS — X58XXXD Exposure to other specified factors, subsequent encounter: Secondary | ICD-10-CM | POA: Diagnosis not present

## 2020-06-09 DIAGNOSIS — T8189XA Other complications of procedures, not elsewhere classified, initial encounter: Secondary | ICD-10-CM | POA: Diagnosis not present

## 2020-06-09 DIAGNOSIS — S81002A Unspecified open wound, left knee, initial encounter: Secondary | ICD-10-CM | POA: Diagnosis not present

## 2020-06-09 DIAGNOSIS — M069 Rheumatoid arthritis, unspecified: Secondary | ICD-10-CM | POA: Diagnosis not present

## 2020-06-09 DIAGNOSIS — Z96611 Presence of right artificial shoulder joint: Secondary | ICD-10-CM | POA: Diagnosis not present

## 2020-06-09 DIAGNOSIS — Z6836 Body mass index (BMI) 36.0-36.9, adult: Secondary | ICD-10-CM | POA: Insufficient documentation

## 2020-06-09 DIAGNOSIS — K760 Fatty (change of) liver, not elsewhere classified: Secondary | ICD-10-CM | POA: Diagnosis not present

## 2020-06-09 DIAGNOSIS — T8130XD Disruption of wound, unspecified, subsequent encounter: Secondary | ICD-10-CM | POA: Insufficient documentation

## 2020-06-09 DIAGNOSIS — K219 Gastro-esophageal reflux disease without esophagitis: Secondary | ICD-10-CM | POA: Insufficient documentation

## 2020-06-09 DIAGNOSIS — S81002D Unspecified open wound, left knee, subsequent encounter: Secondary | ICD-10-CM | POA: Diagnosis not present

## 2020-06-09 DIAGNOSIS — Z87891 Personal history of nicotine dependence: Secondary | ICD-10-CM | POA: Insufficient documentation

## 2020-06-09 DIAGNOSIS — I10 Essential (primary) hypertension: Secondary | ICD-10-CM | POA: Diagnosis not present

## 2020-06-09 DIAGNOSIS — Z86010 Personal history of colonic polyps: Secondary | ICD-10-CM | POA: Diagnosis not present

## 2020-06-09 DIAGNOSIS — E669 Obesity, unspecified: Secondary | ICD-10-CM | POA: Insufficient documentation

## 2020-06-09 DIAGNOSIS — S81802D Unspecified open wound, left lower leg, subsequent encounter: Secondary | ICD-10-CM | POA: Diagnosis not present

## 2020-06-09 HISTORY — PX: APPLICATION OF WOUND VAC: SHX5189

## 2020-06-09 HISTORY — PX: APPLICATION OF A-CELL OF EXTREMITY: SHX6303

## 2020-06-09 HISTORY — PX: I & D EXTREMITY: SHX5045

## 2020-06-09 LAB — BASIC METABOLIC PANEL
Anion gap: 11 (ref 5–15)
BUN: 28 mg/dL — ABNORMAL HIGH (ref 8–23)
CO2: 24 mmol/L (ref 22–32)
Calcium: 9.6 mg/dL (ref 8.9–10.3)
Chloride: 106 mmol/L (ref 98–111)
Creatinine, Ser: 0.99 mg/dL (ref 0.44–1.00)
GFR calc Af Amer: >60 mL/min (ref >60)
GFR calc non Af Amer: 57 mL/min — ABNORMAL LOW (ref >60)
Glucose, Bld: 102 mg/dL — ABNORMAL HIGH (ref 70–99)
Potassium: 3.8 mmol/L (ref 3.5–5.1)
Sodium: 141 mmol/L (ref 135–145)

## 2020-06-09 LAB — CBC
HCT: 38.3 % (ref 36.0–46.0)
Hemoglobin: 11.8 g/dL — ABNORMAL LOW (ref 12.0–15.0)
MCH: 28.9 pg (ref 26.0–34.0)
MCHC: 30.8 g/dL (ref 30.0–36.0)
MCV: 93.6 fL (ref 80.0–100.0)
Platelets: 106 10*3/uL — ABNORMAL LOW (ref 150–400)
RBC: 4.09 MIL/uL (ref 3.87–5.11)
RDW: 16.4 % — ABNORMAL HIGH (ref 11.5–15.5)
WBC: 11.1 10*3/uL — ABNORMAL HIGH (ref 4.0–10.5)
nRBC: 0 % (ref 0.0–0.2)

## 2020-06-09 SURGERY — IRRIGATION AND DEBRIDEMENT EXTREMITY
Anesthesia: General | Site: Knee | Laterality: Left

## 2020-06-09 MED ORDER — CHLORHEXIDINE GLUCONATE 0.12 % MT SOLN
15.0000 mL | Freq: Once | OROMUCOSAL | Status: AC
  Administered 2020-06-09: 15 mL via OROMUCOSAL
  Filled 2020-06-09: qty 15

## 2020-06-09 MED ORDER — FENTANYL CITRATE (PF) 100 MCG/2ML IJ SOLN
INTRAMUSCULAR | Status: DC | PRN
  Administered 2020-06-09: 100 ug via INTRAVENOUS
  Administered 2020-06-09: 50 ug via INTRAVENOUS

## 2020-06-09 MED ORDER — PROPOFOL 10 MG/ML IV BOLUS
INTRAVENOUS | Status: AC
  Filled 2020-06-09: qty 40

## 2020-06-09 MED ORDER — CEFAZOLIN SODIUM-DEXTROSE 2-4 GM/100ML-% IV SOLN
2.0000 g | INTRAVENOUS | Status: AC
  Administered 2020-06-09: 2 g via INTRAVENOUS
  Filled 2020-06-09: qty 100

## 2020-06-09 MED ORDER — FENTANYL CITRATE (PF) 250 MCG/5ML IJ SOLN
INTRAMUSCULAR | Status: AC
  Filled 2020-06-09: qty 5

## 2020-06-09 MED ORDER — SCOPOLAMINE 1 MG/3DAYS TD PT72
MEDICATED_PATCH | TRANSDERMAL | Status: AC
  Filled 2020-06-09: qty 1

## 2020-06-09 MED ORDER — PHENYLEPHRINE 40 MCG/ML (10ML) SYRINGE FOR IV PUSH (FOR BLOOD PRESSURE SUPPORT)
PREFILLED_SYRINGE | INTRAVENOUS | Status: AC
  Filled 2020-06-09: qty 10

## 2020-06-09 MED ORDER — SCOPOLAMINE 1 MG/3DAYS TD PT72
MEDICATED_PATCH | TRANSDERMAL | Status: DC | PRN
  Administered 2020-06-09: 1 via TRANSDERMAL

## 2020-06-09 MED ORDER — ORAL CARE MOUTH RINSE: 15.0000 mL | Freq: Once | OROMUCOSAL | Status: AC

## 2020-06-09 MED ORDER — LACTATED RINGERS IV SOLN: INTRAVENOUS | Status: DC

## 2020-06-09 MED ORDER — SULFAMETHOXAZOLE-TRIMETHOPRIM 800-160 MG PO TABS: 1.0000 | ORAL_TABLET | Freq: Two times a day (BID) | ORAL | 0 refills | Status: AC

## 2020-06-09 MED ORDER — BUPIVACAINE HCL (PF) 0.25 % IJ SOLN
INTRAMUSCULAR | Status: AC
  Filled 2020-06-09: qty 30

## 2020-06-09 MED ORDER — LIDOCAINE-EPINEPHRINE 1 %-1:100000 IJ SOLN
INTRAMUSCULAR | Status: AC
  Filled 2020-06-09: qty 1

## 2020-06-09 MED ORDER — LIDOCAINE 2% (20 MG/ML) 5 ML SYRINGE
INTRAMUSCULAR | Status: DC | PRN
  Administered 2020-06-09: 80 mg via INTRAVENOUS

## 2020-06-09 MED ORDER — SODIUM CHLORIDE 0.9 % IR SOLN
Status: DC | PRN
  Administered 2020-06-09: 2000 mL

## 2020-06-09 MED ORDER — PROPOFOL 10 MG/ML IV BOLUS
INTRAVENOUS | Status: DC | PRN
  Administered 2020-06-09: 120 mg via INTRAVENOUS

## 2020-06-09 MED ORDER — LIDOCAINE-EPINEPHRINE 1 %-1:100000 IJ SOLN
INTRAMUSCULAR | Status: DC | PRN
  Administered 2020-06-09: 10 mL

## 2020-06-09 MED ORDER — ONDANSETRON HCL 4 MG/2ML IJ SOLN
INTRAMUSCULAR | Status: DC | PRN
  Administered 2020-06-09: 4 mg via INTRAVENOUS

## 2020-06-09 MED ORDER — PHENYLEPHRINE 40 MCG/ML (10ML) SYRINGE FOR IV PUSH (FOR BLOOD PRESSURE SUPPORT)
PREFILLED_SYRINGE | INTRAVENOUS | Status: DC | PRN
  Administered 2020-06-09: 80 ug via INTRAVENOUS
  Administered 2020-06-09: 160 ug via INTRAVENOUS

## 2020-06-09 MED ORDER — ONDANSETRON HCL 4 MG/2ML IJ SOLN
INTRAMUSCULAR | Status: AC
  Filled 2020-06-09: qty 2

## 2020-06-09 SURGICAL SUPPLY — 54 items
BNDG COHESIVE 6X5 TAN STRL LF (GAUZE/BANDAGES/DRESSINGS) ×2 IMPLANT
BNDG CONFORM 2 STRL LF (GAUZE/BANDAGES/DRESSINGS) IMPLANT
BNDG ELASTIC 4X5.8 VLCR STR LF (GAUZE/BANDAGES/DRESSINGS) ×2 IMPLANT
BNDG ELASTIC 6X15 VLCR STRL LF (GAUZE/BANDAGES/DRESSINGS) ×2 IMPLANT
BNDG GAUZE ELAST 4 BULKY (GAUZE/BANDAGES/DRESSINGS) IMPLANT
CORD BIPOLAR FORCEPS 12FT (ELECTRODE) IMPLANT
COVER SURGICAL LIGHT HANDLE (MISCELLANEOUS) ×2 IMPLANT
COVER WAND RF STERILE (DRAPES) ×2 IMPLANT
CUFF TOURN SGL QUICK 18X4 (TOURNIQUET CUFF) IMPLANT
CUFF TOURN SGL QUICK 24 (TOURNIQUET CUFF)
CUFF TRNQT CYL 24X4X16.5-23 (TOURNIQUET CUFF) IMPLANT
DRAPE INCISE IOBAN 66X45 STRL (DRAPES) IMPLANT
DRAPE ORTHO SPLIT 77X108 STRL (DRAPES) ×2
DRAPE SURG ORHT 6 SPLT 77X108 (DRAPES) ×1 IMPLANT
DRSG ADAPTIC 3X8 NADH LF (GAUZE/BANDAGES/DRESSINGS) IMPLANT
DRSG KUZMA FLUFF (GAUZE/BANDAGES/DRESSINGS) ×2 IMPLANT
DRSG MEPITEL 4X7.2 (GAUZE/BANDAGES/DRESSINGS) ×2 IMPLANT
GAUZE SPONGE 4X4 12PLY STRL (GAUZE/BANDAGES/DRESSINGS) ×2 IMPLANT
GAUZE XEROFORM 1X8 LF (GAUZE/BANDAGES/DRESSINGS) ×2 IMPLANT
GLOVE BIO SURGEON STRL SZ8 (GLOVE) ×2 IMPLANT
GLOVE BIOGEL M STRL SZ7.5 (GLOVE) ×4 IMPLANT
GLOVE BIOGEL PI IND STRL 8 (GLOVE) ×1 IMPLANT
GLOVE BIOGEL PI INDICATOR 8 (GLOVE) ×1
GOWN STRL REUS W/ TWL LRG LVL3 (GOWN DISPOSABLE) ×2 IMPLANT
GOWN STRL REUS W/TWL LRG LVL3 (GOWN DISPOSABLE) ×4
HANDPIECE INTERPULSE COAX TIP (DISPOSABLE) ×2
IMPL PRIMATRIX AG 6X6CM (Tissue) ×1 IMPLANT
IMPLANT PRIMATRIX AG 6X6CM (Tissue) ×2 IMPLANT
KIT BASIN OR (CUSTOM PROCEDURE TRAY) ×2 IMPLANT
KIT TURNOVER KIT B (KITS) ×2 IMPLANT
MANIFOLD NEPTUNE II (INSTRUMENTS) ×2 IMPLANT
NEEDLE HYPO 25GX1X1/2 BEV (NEEDLE) IMPLANT
NS IRRIG 1000ML POUR BTL (IV SOLUTION) ×2 IMPLANT
PACK ORTHO EXTREMITY (CUSTOM PROCEDURE TRAY) ×2 IMPLANT
PAD ABD 8X10 STRL (GAUZE/BANDAGES/DRESSINGS) IMPLANT
PAD ARMBOARD 7.5X6 YLW CONV (MISCELLANEOUS) ×2 IMPLANT
PAD CAST 4YDX4 CTTN HI CHSV (CAST SUPPLIES) IMPLANT
PADDING CAST COTTON 4X4 STRL (CAST SUPPLIES)
SET CYSTO W/LG BORE CLAMP LF (SET/KITS/TRAYS/PACK) IMPLANT
SET HNDPC FAN SPRY TIP SCT (DISPOSABLE) ×1 IMPLANT
SHEET MEDIUM DRAPE 40X70 STRL (DRAPES) ×2 IMPLANT
SOL PREP POV-IOD 4OZ 10% (MISCELLANEOUS) ×4 IMPLANT
SPONGE LAP 18X18 X RAY DECT (DISPOSABLE) ×2 IMPLANT
SPONGE LAP 4X18 RFD (DISPOSABLE) ×2 IMPLANT
STAPLER VISISTAT 35W (STAPLE) ×2 IMPLANT
STOCKINETTE IMPERVIOUS 9X36 MD (GAUZE/BANDAGES/DRESSINGS) ×2 IMPLANT
SUT VIC AB 4-0 PS2 18 (SUTURE) IMPLANT
SWAB CULTURE ESWAB REG 1ML (MISCELLANEOUS) IMPLANT
SYR CONTROL 10ML LL (SYRINGE) IMPLANT
TOWEL GREEN STERILE (TOWEL DISPOSABLE) ×2 IMPLANT
TOWEL GREEN STERILE FF (TOWEL DISPOSABLE) ×2 IMPLANT
TUBE CONNECTING 12X1/4 (SUCTIONS) ×2 IMPLANT
WATER STERILE IRR 1000ML POUR (IV SOLUTION) ×2 IMPLANT
YANKAUER SUCT BULB TIP NO VENT (SUCTIONS) ×2 IMPLANT

## 2020-06-09 NOTE — Anesthesia Postprocedure Evaluation (Signed)
Anesthesia Post Note  Patient: Morgan [name]  Procedure(s) Performed: Debridement of left knee wound (Left Knee) APPLICATION OF INTEGRA BILAYER OR PRIMATRIX (Left Knee) APPLICATION OF WOUND VAC (Left Knee)     Patient location during evaluation: PACU Anesthesia Type: General Level of consciousness: awake and alert and oriented Pain management: pain level controlled Vital Signs Assessment: post-procedure vital signs reviewed and stable Respiratory status: spontaneous breathing, nonlabored ventilation and respiratory function stable Cardiovascular status: blood pressure returned to baseline and stable Postop Assessment: no apparent nausea or vomiting Anesthetic complications: no   No complications documented.  Last Vitals:  Vitals:   06/09/20 0911 06/09/20 0915  BP:  140/66  Pulse: 70 78  Resp: 11 12  Temp: (!) 36.2 C 36.7 C  SpO2: 100% 100%    Last Pain:  Vitals:   06/09/20 0915  PainSc: 0-No pain                 [name]

## 2020-06-09 NOTE — Transfer of Care (Signed)
Immediate Anesthesia Transfer of Care Note  Patient: Morgan [name]  Procedure(s) Performed: Debridement of left knee wound (Left Knee) APPLICATION OF INTEGRA BILAYER OR PRIMATRIX (Left Knee) APPLICATION OF WOUND VAC (Left Knee)  Patient Location: PACU  Anesthesia Type:General  Level of Consciousness: awake, alert  and oriented  Airway & Oxygen Therapy: Patient Spontanous Breathing and Patient connected to nasal cannula oxygen  Post-op Assessment: Report given to RN, Post -op Vital signs reviewed and stable and Patient moving all extremities  Post vital signs: Reviewed and stable  Last Vitals:  Vitals Value Taken Time  BP 129/55 06/09/20 0817  Temp    Pulse 82 06/09/20 0820  Resp 11 06/09/20 0820  SpO2 97 % 06/09/20 0820  Vitals shown include unvalidated device data.  Last Pain:  Vitals:   06/09/20 0643  PainSc: 2       Patients Stated Pain Goal: 2 (14/70/92 9574)  Complications: No complications documented.

## 2020-06-09 NOTE — Interval H&P Note (Signed)
History and Physical Interval Note:  06/09/2020 7:12 AM  [name]  has presented today for surgery, with the diagnosis of Open left knee wound.  The various methods of treatment have been discussed with the patient and family. After consideration of risks, benefits and other options for treatment, the patient has consented to  Procedure(s) with comments: Debridement of left knee wound (Left) - total case 45 min APPLICATION OF INTEGRA BILAYER OR PRIMATRIX (Left) APPLICATION OF WOUND VAC (Left) as a surgical intervention.  The patient's history has been reviewed, patient examined, no change in status, stable for surgery.  I have reviewed the patient's chart and labs.  Questions were answered to the patient's satisfaction.     Morgan [name]

## 2020-06-09 NOTE — Op Note (Signed)
Operative Note   DATE OF OPERATION: 06/09/2020  SURGICAL DEPARTMENT: Plastic Surgery  PREOPERATIVE DIAGNOSES: Left knee wound  POSTOPERATIVE DIAGNOSES:  same  PROCEDURE: 1.  Debridement of the left knee wound including skin and subcutaneous tissue 5 x 5 cm 2.  Placement of prime matrix mesh to left knee wound totaling 6 x 6 cm 3.  Wound VAC application to left knee wound totaling 5 x 5 cm  SURGEON: Talmadge Coventry, MD  ASSISTANT: Verdie Shire, PA The advanced practice practitioner (APP) assisted throughout the case.  The APP was essential in retraction and counter traction when needed to make the case progress smoothly.  This retraction and assistance made it possible to see the tissue plans for the procedure.  The assistance was needed for blood control, tissue re-approximation and assisted with closure of the incision site.  ANESTHESIA:  General.   COMPLICATIONS: None.   INDICATIONS FOR PROCEDURE:  The patient, Ethelreda Sukhu is a 69 y.o. female born on 13-Sep-1951, is here for treatment of chronic left knee wound.  This was from debridement of prepatellar septic bursitis. MRN: [id number]  CONSENT:  Informed consent was obtained directly from the patient. Risks, benefits and alternatives were fully discussed. Specific risks including but not limited to bleeding, infection, hematoma, seroma, scarring, pain, contracture, asymmetry, wound healing problems, and need for further surgery were all discussed. The patient did have an ample opportunity to have questions answered to satisfaction.   DESCRIPTION OF PROCEDURE:  The patient was taken to the operating room. SCDs were placed and Ancef antibiotics were given.  General anesthesia was administered.  The patient's operative site was prepped and draped in a sterile fashion. A time out was performed and all information was confirmed to be correct.  Started by infiltrating the surrounding wound with 1% lidocaine with epinephrine.  15 blade  was then used to debride the skin sharply.  Hemostasis was obtained.  The wound was 5 x 5 cm with a centimeter of undermining in every direction.  A curette was then used to gently debride the granulation tissue that had built up over the patella.  There is no obvious exposed bone or tendon at this point.  It had all been covered by granulation tissue.  Pulse lavage was then used to irrigate the wound copiously.  No purulence was identified.  A piece of pry matrix mesh was then brought onto the field.  It was soaked in saline for 2 minutes.  15 blade was used to fenestrate it.  It was then placed in the wound and stapled in position.  Mepitel was then placed over top of that and stapled.  Black sponge was then applied along with a wound VAC and a good seal was obtained.  She was then wrapped with Kerlix, Ace wrap and placed in a knee immobilizer.  The patient tolerated the procedure well.  There were no complications. The patient was allowed to wake from anesthesia, extubated and taken to the recovery room in satisfactory condition.

## 2020-06-09 NOTE — Anesthesia Procedure Notes (Signed)
Procedure Name: LMA Insertion Date/Time: 06/09/2020 7:27 AM Performed by: Leonor Liv, CRNA Pre-anesthesia Checklist: Patient identified, Emergency Drugs available, Suction available and Patient being monitored Patient Re-evaluated:Patient Re-evaluated prior to induction Oxygen Delivery Method: Circle System Utilized Preoxygenation: Pre-oxygenation with 100% oxygen Induction Type: IV induction Ventilation: Mask ventilation without difficulty LMA: LMA inserted LMA Size: 4.0 Number of attempts: 1 Airway Equipment and Method: Bite block Placement Confirmation: positive ETCO2 Tube secured with: Tape Dental Injury: Teeth and Oropharynx as per pre-operative assessment

## 2020-06-09 NOTE — Brief Op Note (Signed)
06/09/2020  8:17 AM  PATIENT:  Morgan [name]  69 y.o. female  PRE-OPERATIVE DIAGNOSIS:  Open left knee wound  POST-OPERATIVE DIAGNOSIS:  open left knee wound  PROCEDURE:  Procedure(s) with comments: Debridement of left knee wound (Left) - total case 45 min APPLICATION OF INTEGRA BILAYER OR PRIMATRIX (Left) APPLICATION OF WOUND VAC (Left)  SURGEON:  Surgeon(s) and Role:    * Camren Lipsett, Steffanie Dunn, MD - Primary  PHYSICIAN ASSISTANT: Software engineer, PA  ASSISTANTS: none   ANESTHESIA:   general  EBL:  5 mL   BLOOD ADMINISTERED:none  DRAINS: none   LOCAL MEDICATIONS USED:  LIDOCAINE   SPECIMEN:  No Specimen  DISPOSITION OF SPECIMEN:  PATHOLOGY  COUNTS:  YES  TOURNIQUET:  * No tourniquets in log *  DICTATION: .Dragon Dictation  PLAN OF [name]: Discharge to home after PACU  PATIENT DISPOSITION:  PACU - hemodynamically stable.   Delay start of Pharmacological VTE agent (>24hrs) due to surgical blood loss or risk of bleeding: not applicable

## 2020-06-10 ENCOUNTER — Encounter (HOSPITAL_COMMUNITY): Payer: Self-pay | Admitting: Plastic Surgery

## 2020-06-10 DIAGNOSIS — T8189XA Other complications of procedures, not elsewhere classified, initial encounter: Secondary | ICD-10-CM | POA: Diagnosis not present

## 2020-06-11 DIAGNOSIS — T8189XA Other complications of procedures, not elsewhere classified, initial encounter: Secondary | ICD-10-CM | POA: Diagnosis not present

## 2020-06-11 LAB — ACID FAST CULTURE WITH REFLEXED SENSITIVITIES (MYCOBACTERIA): Acid Fast Culture: NEGATIVE

## 2020-06-12 DIAGNOSIS — T8189XA Other complications of procedures, not elsewhere classified, initial encounter: Secondary | ICD-10-CM | POA: Diagnosis not present

## 2020-06-13 DIAGNOSIS — T8189XA Other complications of procedures, not elsewhere classified, initial encounter: Secondary | ICD-10-CM | POA: Diagnosis not present

## 2020-06-13 LAB — ACID FAST CULTURE WITH REFLEXED SENSITIVITIES (MYCOBACTERIA): Acid Fast Culture: NEGATIVE

## 2020-06-14 ENCOUNTER — Telehealth: Payer: Self-pay | Admitting: Plastic Surgery

## 2020-06-14 DIAGNOSIS — T8189XA Other complications of procedures, not elsewhere classified, initial encounter: Secondary | ICD-10-CM | POA: Diagnosis not present

## 2020-06-14 NOTE — Telephone Encounter (Signed)
Received a call from [place] case review; VAC approval for home use good for 30 days. Will need updated measurements sent to [facility]. Auth case # is [id number], valid 7/6 - 07/07/2020; G9201, E0712 for 15 units, A7000 given as the CPT codes indicated on the authorization request. I will relay this information to the appropriate staff member for review, as the VAC is only good for 30 days.

## 2020-06-14 NOTE — Telephone Encounter (Signed)
Patient called to confirm appt for tomorrow. It was rescheduled from Thursday. [employer] is supposed to come and do training for wound vac, but I could not tell if [employer] had been notified of the appt change made. Patient cannot come in the office two days in a row back to back. Please verify that [employer] can be here tomorrow to do training.

## 2020-06-14 NOTE — Telephone Encounter (Signed)
Spoke with Annitta Needs, she is coming tomorrow at 11:40.

## 2020-06-15 ENCOUNTER — Ambulatory Visit (INDEPENDENT_AMBULATORY_CARE_PROVIDER_SITE_OTHER): Payer: BC Managed Care – PPO | Admitting: Internal Medicine

## 2020-06-15 ENCOUNTER — Other Ambulatory Visit: Payer: Self-pay

## 2020-06-15 ENCOUNTER — Encounter: Payer: Self-pay | Admitting: Surgical

## 2020-06-15 ENCOUNTER — Ambulatory Visit (INDEPENDENT_AMBULATORY_CARE_PROVIDER_SITE_OTHER): Payer: BC Managed Care – PPO | Admitting: Surgical

## 2020-06-15 ENCOUNTER — Telehealth: Payer: Self-pay

## 2020-06-15 VITALS — BP 113/79 | HR 73 | Temp 97.1°F

## 2020-06-15 DIAGNOSIS — S81002D Unspecified open wound, left knee, subsequent encounter: Secondary | ICD-10-CM

## 2020-06-15 DIAGNOSIS — T8130XA Disruption of wound, unspecified, initial encounter: Secondary | ICD-10-CM

## 2020-06-15 DIAGNOSIS — T8189XA Other complications of procedures, not elsewhere classified, initial encounter: Secondary | ICD-10-CM | POA: Diagnosis not present

## 2020-06-15 DIAGNOSIS — Z719 Counseling, unspecified: Secondary | ICD-10-CM

## 2020-06-15 MED ORDER — OXYCODONE HCL 5 MG PO CAPS: 5.0000 mg | ORAL_CAPSULE | Freq: Four times a day (QID) | ORAL | 0 refills | Status: AC | PRN

## 2020-06-15 NOTE — Telephone Encounter (Signed)
Faxed order to [facility] for Matt:  Roll gauze: 3xs a week Adaptic: 3xs a week

## 2020-06-15 NOTE — Progress Notes (Signed)
   Subjective:     Patient ID: Morgan [name], female    DOB: [birth year], 69 y.o.   MRN: [id number]  Chief Complaint  Patient presents with  . Post-op Follow-up    HPI: The patient is a 69 y.o. female here for follow-up on her left knee wound after a fall. She underwent debridement and placement of primatrix with Dr. Claudia Desanctis on 06/09/20. She is here with her daughter and husband.  She is feeling well. No f/c/n/v. She has continued to use the knee immobilizer and has had no issues with wound vac. She is currently on bactrim which was rx post-operatively, she has 3 days left.  She does have a hx of RA and psoriatic arthritis, she is currently on plaquenil.   Review of Systems  Constitutional: Positive for activity change. Negative for chills and fever.  Respiratory: Negative.   Cardiovascular: Negative.   Gastrointestinal: Negative.   Musculoskeletal: Positive for arthralgias and gait problem.  Skin: Positive for wound. Negative for color change.     Objective:   Vital Signs BP 113/79 (BP Location: Right Leg, Patient Position: Sitting, Cuff Size: Large)   Pulse 73   Temp (!) 97.1 F (36.2 C) (Temporal)   LMP  (LMP Unknown)   SpO2 96%  Vital Signs and Nursing Note Reviewed Physical Exam Constitutional:      General: She is not in acute distress.    Appearance: She is not ill-appearing.  HENT:     Head: Normocephalic and atraumatic.  Pulmonary:     Effort: Pulmonary effort is normal.  Musculoskeletal:     Left knee: Deformity and erythema (mild) present. No swelling. Decreased range of motion. Tenderness present.     Comments: L knee wound with primatrix stapled in place. No foul odor. No drainage noted. No purulence or fluctuance. No cellulitis changes. Primatrix not yet incorporated. Wound is 6 x 6 cm x 1 cm  Neurological:     General: No focal deficit present.     Mental Status: She is alert and oriented to person, place, and time.  Psychiatric:        Mood and Affect:  Mood normal.        Behavior: Behavior normal.     Assessment/Plan:     ICD-10-CM   1. Wound dehiscence  T81.30XA   2. Open knee wound, left, subsequent encounter  S81.002D      Patient is doing well, wound matrix is beginning to incorporate. It appears slightly dry, but no sign of infection/seroma/hematoma. No infectious symptoms  Recommend 3x/week wound vac changes. Patient and family educated/instructed on how to change wound vac today from [employer], [place] instructor. Continue to wear knee immobilizer.  We will order patient some adaptic mesh to apply over primatrix prior to each dressing change. Apply small amount of KY jelly to moisturize primatrix and then cover with black sponge.  Recommend calling with questions or concerns. Follow up scheduled for 2 weeks   Carola Rhine Brinnley Lacap, PA-C 06/15/2020, 12:12 PM

## 2020-06-15 NOTE — Progress Notes (Signed)
Patient presented for postop and was evaluated by PA [facility].  I also evaluated patient today.  Morgan [name] is a 69 year old female with pmhx of RA and psoriatic arthritis.  She had debridement with primatrix placement to the left knee on 7/8 by Dr. Claudia Desanctis.  She states the surgery went well.  She has mild to moderate pain.  Mostly worse at night.  She states that the overall wound is stable.  She denies purulent drainage, increased erythema or warmth to the wound site.  She is currently taking a course of Bactrim given post op.  On exam there was tenderness around the wound site but with no signs of infection. Adaptic and ky jelly in place over the wound site. [employer] rep was present for wound VAC instructions.  I will send in a short course of oxycodone to help with pain.  I am available as needed for the patient for any wound care needs.

## 2020-06-16 ENCOUNTER — Ambulatory Visit: Payer: BC Managed Care – PPO | Admitting: Internal Medicine

## 2020-06-16 ENCOUNTER — Encounter: Payer: BC Managed Care – PPO | Admitting: Surgical

## 2020-06-16 ENCOUNTER — Encounter: Payer: Self-pay | Admitting: Family Medicine

## 2020-06-16 ENCOUNTER — Telehealth: Payer: Self-pay | Admitting: Internal Medicine

## 2020-06-16 ENCOUNTER — Encounter: Payer: BC Managed Care – PPO | Admitting: Internal Medicine

## 2020-06-16 DIAGNOSIS — T8189XA Other complications of procedures, not elsewhere classified, initial encounter: Secondary | ICD-10-CM | POA: Diagnosis not present

## 2020-06-16 DIAGNOSIS — S81002A Unspecified open wound, left knee, initial encounter: Secondary | ICD-10-CM | POA: Diagnosis not present

## 2020-06-16 NOTE — Telephone Encounter (Signed)
Patient lvm stating that Dr. Heber Oxford needs to call her ins to do prior auth on medication she prescribed. [phone].

## 2020-06-17 DIAGNOSIS — T8189XA Other complications of procedures, not elsewhere classified, initial encounter: Secondary | ICD-10-CM | POA: Diagnosis not present

## 2020-06-17 NOTE — Telephone Encounter (Signed)
Called [facility] pre authorization, spoke with [place]. Provided all information needed to expedite Oxycodone 5MG .  Called patient that her prescription has been approved.

## 2020-06-18 DIAGNOSIS — T8189XA Other complications of procedures, not elsewhere classified, initial encounter: Secondary | ICD-10-CM | POA: Diagnosis not present

## 2020-06-19 DIAGNOSIS — T8189XA Other complications of procedures, not elsewhere classified, initial encounter: Secondary | ICD-10-CM | POA: Diagnosis not present

## 2020-06-20 DIAGNOSIS — T8189XA Other complications of procedures, not elsewhere classified, initial encounter: Secondary | ICD-10-CM | POA: Diagnosis not present

## 2020-06-21 DIAGNOSIS — T8189XA Other complications of procedures, not elsewhere classified, initial encounter: Secondary | ICD-10-CM | POA: Diagnosis not present

## 2020-06-22 ENCOUNTER — Encounter: Payer: Self-pay | Admitting: Plastic Surgery

## 2020-06-22 ENCOUNTER — Other Ambulatory Visit: Payer: Self-pay

## 2020-06-22 ENCOUNTER — Ambulatory Visit (INDEPENDENT_AMBULATORY_CARE_PROVIDER_SITE_OTHER): Payer: BC Managed Care – PPO | Admitting: Plastic Surgery

## 2020-06-22 VITALS — BP 135/68 | HR 84 | Temp 97.8°F

## 2020-06-22 DIAGNOSIS — S81002D Unspecified open wound, left knee, subsequent encounter: Secondary | ICD-10-CM

## 2020-06-22 DIAGNOSIS — T8189XA Other complications of procedures, not elsewhere classified, initial encounter: Secondary | ICD-10-CM | POA: Diagnosis not present

## 2020-06-22 NOTE — Progress Notes (Signed)
   Referring Provider [name], [address] [place],  [place] [address]   CC:  Chief Complaint  Patient presents with  . Post-op Follow-up      Morgan [name] is an 69 y.o. female.  HPI: Patient is here 2 weeks postop from debridement of the knee wound and placement of prime matrix mesh and wound VAC.  She is overall doing well.  She has noticed a little bit of pain at the superior aspect with a little bit of redness.  They have been changing the wound VAC 3 times a week.  Review of Systems General: Denies fevers or chills  Physical Exam Vitals with BMI 06/22/2020 06/15/2020 06/09/2020  Height - - -  Weight - - -  BMI - - -  Systolic 354 562 563  Diastolic 68 79 66  Pulse 84 73 78    General:  No acute distress,  Alert and oriented, Non-Toxic, Normal speech and affect Examination of the knee wound shows a incorporating pry matrix mesh.  There is a little bit of erythema proximally but no subcutaneous fluid collections or purulence.  There is a little bit of surrounding erythema which is likely due to the wound VAC dressing tape.  Assessment/Plan We will plan to continue the wound VAC for now.  She will also continue the knee immobilizer basically at all times.  If you are able to get a good base of granulation tissue then she should be a candidate for skin graft.  If she does notice any increase in pain, erythema, or swelling particularly in that proximal area to the wound we will plan to call her in an antibiotic.  Otherwise we will see her again in 1 to 2 weeks.  Morgan [name] 06/22/2020, 4:56 PM

## 2020-06-23 DIAGNOSIS — T8189XA Other complications of procedures, not elsewhere classified, initial encounter: Secondary | ICD-10-CM | POA: Diagnosis not present

## 2020-06-24 DIAGNOSIS — T8189XA Other complications of procedures, not elsewhere classified, initial encounter: Secondary | ICD-10-CM | POA: Diagnosis not present

## 2020-06-25 DIAGNOSIS — T8189XA Other complications of procedures, not elsewhere classified, initial encounter: Secondary | ICD-10-CM | POA: Diagnosis not present

## 2020-06-26 DIAGNOSIS — T8189XA Other complications of procedures, not elsewhere classified, initial encounter: Secondary | ICD-10-CM | POA: Diagnosis not present

## 2020-06-27 DIAGNOSIS — T8189XA Other complications of procedures, not elsewhere classified, initial encounter: Secondary | ICD-10-CM | POA: Diagnosis not present

## 2020-06-28 ENCOUNTER — Telehealth: Payer: Self-pay | Admitting: Plastic Surgery

## 2020-06-28 ENCOUNTER — Telehealth: Payer: Self-pay

## 2020-06-28 DIAGNOSIS — T8189XA Other complications of procedures, not elsewhere classified, initial encounter: Secondary | ICD-10-CM | POA: Diagnosis not present

## 2020-06-28 DIAGNOSIS — L0390 Cellulitis, unspecified: Secondary | ICD-10-CM | POA: Diagnosis not present

## 2020-06-28 DIAGNOSIS — M71169 Other infective bursitis, unspecified knee: Secondary | ICD-10-CM | POA: Diagnosis not present

## 2020-06-28 DIAGNOSIS — T8130XA Disruption of wound, unspecified, initial encounter: Secondary | ICD-10-CM | POA: Diagnosis not present

## 2020-06-28 NOTE — Telephone Encounter (Signed)
I have called the patient to discuss. She is doing well overall, has some increased pain. No infectious symptoms per pt (f/c/n/v). She is coming tomorrow for an appointment.

## 2020-06-28 NOTE — Telephone Encounter (Signed)
Patient's daughter lvm stating there were issues with wound vac on Sunday and they sent a mychart message with pictures, but no response. The sponge was placed on the graft and it could have pulled off the graft. She said she called Shaneshia from [facility] has called our office a couple times. Message given to [place] to return call. Daughter said their expectation is to not have any set backs and the office should be as educated about the wound vac care and the family is expected to be. Daughter is not requesting a phone call back but thinks that her parents should have some form of communication back.

## 2020-06-28 NOTE — Progress Notes (Signed)
Subjective:     Patient ID: Morgan [name], female    DOB: 03-15-1951, 69 y.o.   MRN: [id number]  Chief Complaint  Patient presents with   Follow-up    HPI: The patient is a 69 y.o. female here for follow-up on her left knee wound.  Patient developed this wound on her left knee after a fall.  She had the initial wound since April 2021.  Patient had initial debridement with orthopedic surgeon.  Patient is here today for reevaluation of her left knee wound and change of wound VAC.  Their daughter has been helping him at home with wound VAC changes.  They have been doing a great job with this.  Patient reports some increased pain in her left knee over the past week.  No infectious symptoms.   Review of Systems  Constitutional: Positive for activity change. Negative for fever.  Cardiovascular: Negative for leg swelling.  Skin: Positive for wound. Negative for rash.     Objective:   Vital Signs BP (!) 137/66 (BP Location: Left Arm, Patient Position: Sitting, Cuff Size: Large)    Pulse 89    Temp 98.4 F (36.9 C) (Oral)    LMP  (LMP Unknown)    SpO2 97%  Vital Signs and Nursing Note Reviewed Chaperone present Physical Exam Constitutional:      General: She is not in acute distress.    Appearance: Normal appearance. She is not ill-appearing.  Pulmonary:     Effort: Pulmonary effort is normal.  Musculoskeletal:     Left knee: Deformity present. No swelling or erythema. Decreased range of motion. Tenderness present.     Comments: Left knee wound that is approximately 4 x 4 cm with a good base of granulation tissue noted.  No foul odor noted.  Some tenderness to palpation.  Wound matrix has incorporated.  No periwound erythema or cellulitis.  No overt drainage noted.  Neurological:     General: No focal deficit present.     Mental Status: She is alert and oriented to person, place, and time.  Psychiatric:        Mood and Affect: Mood normal.        Behavior: Behavior normal.      Assessment/Plan:     ICD-10-CM   1. Open knee wound, left, subsequent encounter  S81.002D   2. Wound dehiscence  T81.30XA     Patient is 69 year old female here for follow-up on her left knee wound.  Wound initially developed in April 2021 after a fall.  Patient subsequently underwent debridement and placement of wound matrix with Dr. Claudia Desanctis on 06/09/2020.  Patient has been doing wound VAC changes with assistance from her daughter and husband.  They are doing a great job with wound care.  Today on exam there is no sign of infection, seroma, hematoma.  She has good base of granulation tissue.  Donated ACell was applied to the left knee wound followed by Adaptic, K-Y jelly, wound VAC sponge.  Good seal was noted on the wound VAC.  Patient was placed in immobilizer.  Recommend following up in 2 weeks for reevaluation.  Hopeful at that point she will be ready to schedule for skin graft.  Recommend calling with questions or concerns.  Greater than 20 minutes was spent with the patient face-to-face discussing the plan, educating the patient on current appearance of the wound and applying wound VAC/dressing.  Pictures were obtained of the patient and placed in the chart with the patient's  or guardian's permission.    Carola Rhine Quantarius Genrich, PA-C 06/29/2020, 3:50 PM

## 2020-06-28 NOTE — Telephone Encounter (Signed)
Returned call to [facility] regarding pt's request for wound vac demonstration with clinical staff Arley Phenix will be in office for training tomorrow @ 8am.

## 2020-06-29 ENCOUNTER — Encounter: Payer: Self-pay | Admitting: Surgical

## 2020-06-29 ENCOUNTER — Other Ambulatory Visit: Payer: Self-pay

## 2020-06-29 ENCOUNTER — Ambulatory Visit (INDEPENDENT_AMBULATORY_CARE_PROVIDER_SITE_OTHER): Payer: BC Managed Care – PPO | Admitting: Surgical

## 2020-06-29 VITALS — BP 137/66 | HR 89 | Temp 98.4°F

## 2020-06-29 DIAGNOSIS — T8130XA Disruption of wound, unspecified, initial encounter: Secondary | ICD-10-CM | POA: Diagnosis not present

## 2020-06-29 DIAGNOSIS — S81002D Unspecified open wound, left knee, subsequent encounter: Secondary | ICD-10-CM

## 2020-06-29 DIAGNOSIS — T8189XA Other complications of procedures, not elsewhere classified, initial encounter: Secondary | ICD-10-CM | POA: Diagnosis not present

## 2020-06-30 DIAGNOSIS — T8189XA Other complications of procedures, not elsewhere classified, initial encounter: Secondary | ICD-10-CM | POA: Diagnosis not present

## 2020-07-01 DIAGNOSIS — T8189XA Other complications of procedures, not elsewhere classified, initial encounter: Secondary | ICD-10-CM | POA: Diagnosis not present

## 2020-07-02 DIAGNOSIS — T8189XA Other complications of procedures, not elsewhere classified, initial encounter: Secondary | ICD-10-CM | POA: Diagnosis not present

## 2020-07-03 DIAGNOSIS — T8189XA Other complications of procedures, not elsewhere classified, initial encounter: Secondary | ICD-10-CM | POA: Diagnosis not present

## 2020-07-04 ENCOUNTER — Telehealth: Payer: Self-pay | Admitting: Internal Medicine

## 2020-07-04 DIAGNOSIS — T8189XA Other complications of procedures, not elsewhere classified, initial encounter: Secondary | ICD-10-CM | POA: Diagnosis not present

## 2020-07-04 NOTE — Telephone Encounter (Signed)
Patient called and in the past 10 days, her pain has doubled and she wants some reassurance that this is normal and some guidance on how to take her medication to possibly alleviate the pain better. Please call patient to advise.

## 2020-07-04 NOTE — Telephone Encounter (Signed)
Spoke with patient and husband. She reports she is feeling well. I discussed with her that the wound appears to be doing well from the EMR photos.  In regards to increased pain, unsure of exact etiology, but there is no overt signs based on symptoms/image review of any concerns. Patient feels well other than increased pain. No f/c/n/v  Discussed pain control plan and medication scheduling. Will send pt example schedule for how to time taking APAP/NSAIDS/NORCO.  Recommend calling with further questions or concerns.

## 2020-07-05 DIAGNOSIS — T8189XA Other complications of procedures, not elsewhere classified, initial encounter: Secondary | ICD-10-CM | POA: Diagnosis not present

## 2020-07-06 DIAGNOSIS — T8189XA Other complications of procedures, not elsewhere classified, initial encounter: Secondary | ICD-10-CM | POA: Diagnosis not present

## 2020-07-06 DIAGNOSIS — S81002A Unspecified open wound, left knee, initial encounter: Secondary | ICD-10-CM | POA: Diagnosis not present

## 2020-07-07 ENCOUNTER — Ambulatory Visit: Payer: BC Managed Care – PPO | Admitting: Plastic Surgery

## 2020-07-07 DIAGNOSIS — T8189XA Other complications of procedures, not elsewhere classified, initial encounter: Secondary | ICD-10-CM | POA: Diagnosis not present

## 2020-07-07 NOTE — Telephone Encounter (Signed)
Called and spoke with patient, advised her that overall everything appears to be doing well.  There is no sign on the imaging of any infection.  We did discuss pain control and she reports pain control has been better since taking Tylenol and ibuprofen on a schedule.  We also discussed limiting the amount of Tylenol and ibuprofen she is taking per day.

## 2020-07-08 DIAGNOSIS — T8189XA Other complications of procedures, not elsewhere classified, initial encounter: Secondary | ICD-10-CM | POA: Diagnosis not present

## 2020-07-09 DIAGNOSIS — T8189XA Other complications of procedures, not elsewhere classified, initial encounter: Secondary | ICD-10-CM | POA: Diagnosis not present

## 2020-07-10 DIAGNOSIS — T8189XA Other complications of procedures, not elsewhere classified, initial encounter: Secondary | ICD-10-CM | POA: Diagnosis not present

## 2020-07-11 DIAGNOSIS — T8189XA Other complications of procedures, not elsewhere classified, initial encounter: Secondary | ICD-10-CM | POA: Diagnosis not present

## 2020-07-12 DIAGNOSIS — T8189XA Other complications of procedures, not elsewhere classified, initial encounter: Secondary | ICD-10-CM | POA: Diagnosis not present

## 2020-07-13 ENCOUNTER — Encounter: Payer: Self-pay | Admitting: Plastic Surgery

## 2020-07-13 ENCOUNTER — Ambulatory Visit (INDEPENDENT_AMBULATORY_CARE_PROVIDER_SITE_OTHER): Payer: BC Managed Care – PPO | Admitting: Plastic Surgery

## 2020-07-13 ENCOUNTER — Other Ambulatory Visit: Payer: Self-pay

## 2020-07-13 VITALS — BP 114/58 | HR 81 | Temp 98.7°F

## 2020-07-13 DIAGNOSIS — S81002D Unspecified open wound, left knee, subsequent encounter: Secondary | ICD-10-CM

## 2020-07-13 DIAGNOSIS — T8189XA Other complications of procedures, not elsewhere classified, initial encounter: Secondary | ICD-10-CM | POA: Diagnosis not present

## 2020-07-13 NOTE — H&P (View-Only) (Signed)
   Referring Provider [name], [place],  [place] [address]   CC:  Chief Complaint  Patient presents with  . Follow-up      Morgan [name] is an 69 y.o. female.  HPI: Patient is here in follow-up for left knee wound.  I debrided this over a month ago and placed pry matrix mesh.  She has been doing the wound VAC since then and has been keeping her knee in extension.  Overall progression of the wound is doing well.  Review of Systems General: Denies fevers or chills  Physical Exam Vitals with BMI 07/13/2020 06/29/2020 06/22/2020  Height - - -  Weight - - -  BMI - - -  Systolic 147 092 957  Diastolic 58 66 68  Pulse 81 89 84    General:  No acute distress,  Alert and oriented, Non-Toxic, Normal speech and affect On examination the wound is made quite a bit of progress.  There is now beefy granulation tissue over the entirety of the wound base with the exception of 2 small areas.  There is minimal surrounding erythema and no purulent drainage.  Assessment/Plan I think the wound would be ready to accept a skin graft at this point.  We discussed the risks include bleeding, infection, demonstrating structures, need for additional procedures.  We discussed the potential for poor take of the graft.  We discussed the location of the donor site which would be the left thigh.  I explained I would replace the wound VAC afterwards and keep her knee in extension.  We will plan to do this in the next 2 weeks or so.  All of her questions were answered.  [name] 07/13/2020, 3:45 PM

## 2020-07-13 NOTE — Progress Notes (Signed)
   Referring Provider Andy, Camille L, MD [address] [place],  [place] [address]   CC:  Chief Complaint  Patient presents with  . Follow-up      Adylee P Salatino is an 69 y.o. female.  HPI: Patient is here in follow-up for left knee wound.  I debrided this over a month ago and placed pry matrix mesh.  She has been doing the wound VAC since then and has been keeping her knee in extension.  Overall progression of the wound is doing well.  Review of Systems General: Denies fevers or chills  Physical Exam Vitals with BMI 07/13/2020 06/29/2020 06/22/2020  Height - - -  Weight - - -  BMI - - -  Systolic 114 137 135  Diastolic 58 66 68  Pulse 81 89 84    General:  No acute distress,  Alert and oriented, Non-Toxic, Normal speech and affect On examination the wound is made quite a bit of progress.  There is now beefy granulation tissue over the entirety of the wound base with the exception of 2 small areas.  There is minimal surrounding erythema and no purulent drainage.  Assessment/Plan I think the wound would be ready to accept a skin graft at this point.  We discussed the risks include bleeding, infection, demonstrating structures, need for additional procedures.  We discussed the potential for poor take of the graft.  We discussed the location of the donor site which would be the left thigh.  I explained I would replace the wound VAC afterwards and keep her knee in extension.  We will plan to do this in the next 2 weeks or so.  All of her questions were answered.  Nyiah Pianka S Baylon Santelli 07/13/2020, 3:45 PM        

## 2020-07-14 DIAGNOSIS — S81002A Unspecified open wound, left knee, initial encounter: Secondary | ICD-10-CM | POA: Diagnosis not present

## 2020-07-14 DIAGNOSIS — T8189XA Other complications of procedures, not elsewhere classified, initial encounter: Secondary | ICD-10-CM | POA: Diagnosis not present

## 2020-07-15 DIAGNOSIS — T8189XA Other complications of procedures, not elsewhere classified, initial encounter: Secondary | ICD-10-CM | POA: Diagnosis not present

## 2020-07-16 DIAGNOSIS — T8189XA Other complications of procedures, not elsewhere classified, initial encounter: Secondary | ICD-10-CM | POA: Diagnosis not present

## 2020-07-17 DIAGNOSIS — T8189XA Other complications of procedures, not elsewhere classified, initial encounter: Secondary | ICD-10-CM | POA: Diagnosis not present

## 2020-07-18 DIAGNOSIS — T8189XA Other complications of procedures, not elsewhere classified, initial encounter: Secondary | ICD-10-CM | POA: Diagnosis not present

## 2020-07-19 ENCOUNTER — Telehealth: Payer: Self-pay

## 2020-07-19 DIAGNOSIS — T8189XA Other complications of procedures, not elsewhere classified, initial encounter: Secondary | ICD-10-CM | POA: Diagnosis not present

## 2020-07-19 NOTE — Telephone Encounter (Signed)
Request for renewal of wound vac faxed to [employer] to fax# [phone]  I also sent office notes/surgery info. Scanned a copy to chart I called & informed pt that this request was submitted- I instructed her to call us if there are any concerns or changes.

## 2020-07-20 DIAGNOSIS — T8189XA Other complications of procedures, not elsewhere classified, initial encounter: Secondary | ICD-10-CM | POA: Diagnosis not present

## 2020-07-21 ENCOUNTER — Telehealth: Payer: Self-pay | Admitting: Plastic Surgery

## 2020-07-21 DIAGNOSIS — T8189XA Other complications of procedures, not elsewhere classified, initial encounter: Secondary | ICD-10-CM | POA: Diagnosis not present

## 2020-07-21 NOTE — Telephone Encounter (Signed)
Barnetta Chapel from [facility] called to request wound measurements. Her number is [phone] est [phone] please call to advise

## 2020-07-22 ENCOUNTER — Other Ambulatory Visit (HOSPITAL_COMMUNITY)
Admission: RE | Admit: 2020-07-22 | Discharge: 2020-07-22 | Disposition: A | Discharge status: Home / Self Care | Payer: BC Managed Care – PPO | Source: Ambulatory Visit | Attending: Plastic Surgery | Admitting: Plastic Surgery

## 2020-07-22 ENCOUNTER — Telehealth: Payer: Self-pay

## 2020-07-22 DIAGNOSIS — Z01812 Encounter for preprocedural laboratory examination: Secondary | ICD-10-CM | POA: Insufficient documentation

## 2020-07-22 DIAGNOSIS — Z20822 Contact with and (suspected) exposure to covid-19: Secondary | ICD-10-CM | POA: Diagnosis not present

## 2020-07-22 DIAGNOSIS — T8189XA Other complications of procedures, not elsewhere classified, initial encounter: Secondary | ICD-10-CM | POA: Diagnosis not present

## 2020-07-22 LAB — SARS CORONAVIRUS 2 (TAT 6-24 HRS): SARS Coronavirus 2: NEGATIVE

## 2020-07-22 NOTE — Telephone Encounter (Signed)
Renewal of wound vac forwarded to [facility]- per their request the following was submitted: wound measurements/dates via fax [phone] Copy scanned into chart

## 2020-07-22 NOTE — Telephone Encounter (Signed)
See office note on (07/22/20).//AB/CMA

## 2020-07-23 DIAGNOSIS — T8189XA Other complications of procedures, not elsewhere classified, initial encounter: Secondary | ICD-10-CM | POA: Diagnosis not present

## 2020-07-24 DIAGNOSIS — T8189XA Other complications of procedures, not elsewhere classified, initial encounter: Secondary | ICD-10-CM | POA: Diagnosis not present

## 2020-07-25 ENCOUNTER — Other Ambulatory Visit: Payer: Self-pay

## 2020-07-25 ENCOUNTER — Encounter (HOSPITAL_COMMUNITY): Payer: Self-pay | Admitting: Plastic Surgery

## 2020-07-25 DIAGNOSIS — T8189XA Other complications of procedures, not elsewhere classified, initial encounter: Secondary | ICD-10-CM | POA: Diagnosis not present

## 2020-07-25 NOTE — Progress Notes (Signed)
PCP Jonni Sanger, MD Cardiologist - pt deneis   Chest x-ray - n/a EKG - 06/09/20  Stress Test - per pt >20 years ago  ECHO - pt denies Cardiac Cath - pt denies   Sleep Study - pt denies  CPAP - n/a  Blood Thinner Instructions: n/a Aspirin Instructions: Follow your surgeon's instructions on when to stop Aspirin.  If no instructions were given by your surgeon then you will need to call the office to get those instructions.    Per pt - last dose ASA  07/18/20    COVID TEST- negative 07/22/20  Coronavirus Screening  Have you experienced the following symptoms:  Cough yes/no: No Fever (>100.56F)  yes/no: No Runny nose yes/no: No Sore throat yes/no: No Difficulty breathing/shortness of breath  yes/no: No  Have you or a family member traveled in the last 14 days and where? yes/no: No   Anesthesia review: n/a   -------------  SDW INSTRUCTIONS:  Your procedure is scheduled on Tuesday August 24 from 13:00- 14:18  Report to [facility] Main Entrance "A" at 10:00 A.M., and check in at the Admitting office.  Call this number if you have problems the morning of surgery: [phone]   Remember: Do not eat or drink after midnight the night before your surgery  Take these medicines the morning of surgery with A SIP OF WATER: doxepin (SINEQUAN)  fexofenadine (ALLEGRA)  IF NEEDED: acetaminophen (TYLENOL), fluticasone (FLONASE), eye drops  Follow your surgeon's instructions on when to stop Aspirin.  If no instructions were given by your surgeon then you will need to call the office to get those instructions.    As of today, STOP taking any Aspirin containing products, Aleve, Naproxen, Ibuprofen, Motrin, Advil, Goody's, BC's, all herbal medications, fish oil, and all vitamins.    The Morning of Surgery  Do not wear jewelry, make-up or nail polish.  Do not wear lotions, powders, or perfumes, or deodorant  Do not shave 48 hours prior to surgery.    Do not bring valuables to the  hospital.  [facility] is not responsible for any belongings or valuables.  If you are a smoker, DO NOT Smoke 24 hours prior to surgery  If you wear a CPAP at night please bring your mask the morning of surgery   Remember that you must have someone to transport you home after your surgery, and remain with you for 24 hours if you are discharged the same day.   Please bring cases for contacts, glasses, hearing aids, dentures or bridgework because it cannot be worn into surgery.    Leave your suitcase in the car.  After surgery it may be brought to your room.  For patients admitted to the hospital, discharge time will be determined by your treatment team.  Patients discharged the day of surgery will not be allowed to drive home.    Special instructions:   [facility]- Preparing For Surgery  Oral Hygiene is also important to reduce your risk of infection.  Remember - BRUSH YOUR TEETH THE MORNING OF SURGERY WITH YOUR REGULAR TOOTHPASTE  Please follow these instructions carefully.   1. Shower the NIGHT BEFORE SURGERY and the MORNING OF SURGERY with DIAL Soap.   2. Wash thoroughly, paying special attention to the area where your surgery will be performed.  3. Thoroughly rinse your body with warm water from the neck down.  4. Pat yourself dry with a CLEAN TOWEL.  5. Wear CLEAN PAJAMAS to bed the night  before surgery  6. Place CLEAN SHEETS on your bed the night of your first shower and DO NOT SLEEP WITH PETS.  7. Wear comfortable clothes the morning of surgery.    Day of Surgery:  Please shower the morning of surgery with the CHG soap Do not apply any deodorants/lotions. Please wear clean clothes to the hospital/surgery center.   Remember to brush your teeth WITH YOUR REGULAR TOOTHPASTE.   Please read over the following fact sheets that you were given.  Patient denies shortness of breath, fever, cough and chest pain.

## 2020-07-26 ENCOUNTER — Encounter (HOSPITAL_COMMUNITY): Payer: Self-pay | Admitting: Plastic Surgery

## 2020-07-26 ENCOUNTER — Encounter (HOSPITAL_COMMUNITY)
Admission: RE | Disposition: A | Discharge status: Home / Self Care | Payer: Self-pay | Source: Home / Self Care | Attending: Plastic Surgery

## 2020-07-26 ENCOUNTER — Ambulatory Visit (HOSPITAL_COMMUNITY): Payer: BC Managed Care – PPO | Admitting: Certified Registered Nurse Anesthetist

## 2020-07-26 ENCOUNTER — Other Ambulatory Visit: Payer: Self-pay

## 2020-07-26 ENCOUNTER — Ambulatory Visit (HOSPITAL_COMMUNITY)
Admission: RE | Admit: 2020-07-26 | Discharge: 2020-07-26 | Disposition: A | Discharge status: Home / Self Care | Payer: BC Managed Care – PPO | Attending: Plastic Surgery | Admitting: Plastic Surgery

## 2020-07-26 DIAGNOSIS — I10 Essential (primary) hypertension: Secondary | ICD-10-CM | POA: Insufficient documentation

## 2020-07-26 DIAGNOSIS — M069 Rheumatoid arthritis, unspecified: Secondary | ICD-10-CM | POA: Diagnosis not present

## 2020-07-26 DIAGNOSIS — X58XXXA Exposure to other specified factors, initial encounter: Secondary | ICD-10-CM | POA: Diagnosis not present

## 2020-07-26 DIAGNOSIS — T8189XA Other complications of procedures, not elsewhere classified, initial encounter: Secondary | ICD-10-CM | POA: Diagnosis not present

## 2020-07-26 DIAGNOSIS — K219 Gastro-esophageal reflux disease without esophagitis: Secondary | ICD-10-CM | POA: Diagnosis not present

## 2020-07-26 DIAGNOSIS — Z6837 Body mass index (BMI) 37.0-37.9, adult: Secondary | ICD-10-CM | POA: Diagnosis not present

## 2020-07-26 DIAGNOSIS — Z87891 Personal history of nicotine dependence: Secondary | ICD-10-CM | POA: Diagnosis not present

## 2020-07-26 DIAGNOSIS — T8130XA Disruption of wound, unspecified, initial encounter: Secondary | ICD-10-CM | POA: Diagnosis not present

## 2020-07-26 DIAGNOSIS — K7581 Nonalcoholic steatohepatitis (NASH): Secondary | ICD-10-CM | POA: Diagnosis not present

## 2020-07-26 DIAGNOSIS — E782 Mixed hyperlipidemia: Secondary | ICD-10-CM | POA: Diagnosis not present

## 2020-07-26 DIAGNOSIS — S81009A Unspecified open wound, unspecified knee, initial encounter: Secondary | ICD-10-CM | POA: Diagnosis not present

## 2020-07-26 DIAGNOSIS — S81802D Unspecified open wound, left lower leg, subsequent encounter: Secondary | ICD-10-CM | POA: Diagnosis not present

## 2020-07-26 DIAGNOSIS — S81002A Unspecified open wound, left knee, initial encounter: Secondary | ICD-10-CM | POA: Insufficient documentation

## 2020-07-26 DIAGNOSIS — D696 Thrombocytopenia, unspecified: Secondary | ICD-10-CM | POA: Diagnosis not present

## 2020-07-26 HISTORY — PX: APPLICATION OF WOUND VAC: SHX5189

## 2020-07-26 HISTORY — PX: SKIN SPLIT GRAFT: SHX444

## 2020-07-26 LAB — COMPREHENSIVE METABOLIC PANEL
ALT: 23 U/L (ref 0–44)
AST: 29 U/L (ref 15–41)
Albumin: 4.2 g/dL (ref 3.5–5.0)
Alkaline Phosphatase: 43 U/L (ref 38–126)
Anion gap: 12 (ref 5–15)
BUN: 22 mg/dL (ref 8–23)
CO2: 25 mmol/L (ref 22–32)
Calcium: 9.7 mg/dL (ref 8.9–10.3)
Chloride: 106 mmol/L (ref 98–111)
Creatinine, Ser: 0.91 mg/dL (ref 0.44–1.00)
GFR calc Af Amer: >60 mL/min (ref >60)
GFR calc non Af Amer: >60 mL/min (ref >60)
Glucose, Bld: 98 mg/dL (ref 70–99)
Potassium: 3.8 mmol/L (ref 3.5–5.1)
Sodium: 143 mmol/L (ref 135–145)
Total Bilirubin: 0.3 mg/dL (ref 0.3–1.2)
Total Protein: 7.3 g/dL (ref 6.5–8.1)

## 2020-07-26 SURGERY — APPLICATION, GRAFT, SKIN, SPLIT-THICKNESS
Anesthesia: General | Site: Knee | Laterality: Left

## 2020-07-26 MED ORDER — SCOPOLAMINE 1 MG/3DAYS TD PT72
MEDICATED_PATCH | TRANSDERMAL | Status: DC | PRN
  Administered 2020-07-26: 1 via TRANSDERMAL

## 2020-07-26 MED ORDER — FENTANYL CITRATE (PF) 100 MCG/2ML IJ SOLN: 25.0000 ug | INTRAMUSCULAR | Status: DC | PRN

## 2020-07-26 MED ORDER — FENTANYL CITRATE (PF) 100 MCG/2ML IJ SOLN
INTRAMUSCULAR | Status: DC | PRN
Start: 2020-07-26 — End: 2020-07-26
  Administered 2020-07-26: 100 ug via INTRAVENOUS
  Administered 2020-07-26: 50 ug via INTRAVENOUS

## 2020-07-26 MED ORDER — SODIUM BICARBONATE 4 % IV SOLN
INTRAVENOUS | Status: DC | PRN
  Administered 2020-07-26: 1000 mL via INTRAMUSCULAR

## 2020-07-26 MED ORDER — MEPERIDINE HCL 25 MG/ML IJ SOLN: 6.2500 mg | INTRAMUSCULAR | Status: DC | PRN

## 2020-07-26 MED ORDER — MINERAL OIL LIGHT 100 % EX OIL
TOPICAL_OIL | CUTANEOUS | Status: DC | PRN
  Administered 2020-07-26: 1 via TOPICAL

## 2020-07-26 MED ORDER — LIDOCAINE 2% (20 MG/ML) 5 ML SYRINGE
INTRAMUSCULAR | Status: DC | PRN
  Administered 2020-07-26: 20 mg via INTRAVENOUS

## 2020-07-26 MED ORDER — ONDANSETRON HCL 4 MG/2ML IJ SOLN
INTRAMUSCULAR | Status: AC
  Filled 2020-07-26: qty 2

## 2020-07-26 MED ORDER — AMISULPRIDE (ANTIEMETIC) 5 MG/2ML IV SOLN: 10.0000 mg | Freq: Once | INTRAVENOUS | Status: DC | PRN

## 2020-07-26 MED ORDER — OXYCODONE HCL 5 MG PO CAPS
5.0000 mg | ORAL_CAPSULE | Freq: Four times a day (QID) | ORAL | 0 refills | Status: AC | PRN
Start: 2020-07-26 — End: 2020-07-31

## 2020-07-26 MED ORDER — PROMETHAZINE HCL 25 MG/ML IJ SOLN: 6.2500 mg | INTRAMUSCULAR | Status: DC | PRN

## 2020-07-26 MED ORDER — EPINEPHRINE PF 1 MG/ML IJ SOLN
INTRAMUSCULAR | Status: AC
  Filled 2020-07-26: qty 1

## 2020-07-26 MED ORDER — FENTANYL CITRATE (PF) 250 MCG/5ML IJ SOLN
INTRAMUSCULAR | Status: AC
  Filled 2020-07-26: qty 5

## 2020-07-26 MED ORDER — SODIUM BICARBONATE 4 % IV SOLN
Freq: Once | INTRAVENOUS | Status: DC
  Filled 2020-07-26: qty 50

## 2020-07-26 MED ORDER — PROPOFOL 10 MG/ML IV BOLUS
INTRAVENOUS | Status: AC
  Filled 2020-07-26: qty 20

## 2020-07-26 MED ORDER — ONDANSETRON HCL 4 MG/2ML IJ SOLN
INTRAMUSCULAR | Status: DC | PRN
  Administered 2020-07-26: 4 mg via INTRAVENOUS

## 2020-07-26 MED ORDER — ONDANSETRON HCL 4 MG/2ML IJ SOLN: 4.0000 mg | Freq: Once | INTRAMUSCULAR | Status: DC | PRN

## 2020-07-26 MED ORDER — DEXAMETHASONE SODIUM PHOSPHATE 10 MG/ML IJ SOLN
INTRAMUSCULAR | Status: DC | PRN
  Administered 2020-07-26: 10 mg via INTRAVENOUS

## 2020-07-26 MED ORDER — CHLORHEXIDINE GLUCONATE 0.12 % MT SOLN
15.0000 mL | Freq: Once | OROMUCOSAL | Status: AC
  Administered 2020-07-26: 15 mL via OROMUCOSAL
  Filled 2020-07-26: qty 15

## 2020-07-26 MED ORDER — BUPIVACAINE HCL (PF) 0.25 % IJ SOLN
INTRAMUSCULAR | Status: AC
  Filled 2020-07-26: qty 30

## 2020-07-26 MED ORDER — ORAL CARE MOUTH RINSE: 15.0000 mL | Freq: Once | OROMUCOSAL | Status: AC

## 2020-07-26 MED ORDER — OXYCODONE HCL 5 MG/5ML PO SOLN: 5.0000 mg | Freq: Once | ORAL | Status: AC | PRN

## 2020-07-26 MED ORDER — CEFAZOLIN SODIUM-DEXTROSE 2-4 GM/100ML-% IV SOLN
2.0000 g | INTRAVENOUS | Status: AC
  Administered 2020-07-26: 2 g via INTRAVENOUS
  Filled 2020-07-26: qty 100

## 2020-07-26 MED ORDER — HYDROMORPHONE HCL 1 MG/ML IJ SOLN
0.2500 mg | INTRAMUSCULAR | Status: DC | PRN
  Administered 2020-07-26 (×3): 0.5 mg via INTRAVENOUS

## 2020-07-26 MED ORDER — HYDROMORPHONE HCL 1 MG/ML IJ SOLN
INTRAMUSCULAR | Status: AC
  Filled 2020-07-26: qty 1

## 2020-07-26 MED ORDER — MIDAZOLAM HCL 2 MG/2ML IJ SOLN: 0.5000 mg | Freq: Once | INTRAMUSCULAR | Status: DC | PRN

## 2020-07-26 MED ORDER — SCOPOLAMINE 1 MG/3DAYS TD PT72
MEDICATED_PATCH | TRANSDERMAL | Status: AC
  Filled 2020-07-26: qty 1

## 2020-07-26 MED ORDER — LACTATED RINGERS IV SOLN: INTRAVENOUS | Status: DC

## 2020-07-26 MED ORDER — PROPOFOL 10 MG/ML IV BOLUS
INTRAVENOUS | Status: DC | PRN
  Administered 2020-07-26: 150 mg via INTRAVENOUS

## 2020-07-26 MED ORDER — HYDROMORPHONE HCL 1 MG/ML IJ SOLN
INTRAMUSCULAR | Status: DC
Start: 2020-07-26 — End: 2020-07-26
  Filled 2020-07-26: qty 1

## 2020-07-26 MED ORDER — SODIUM CHLORIDE 0.9 % IV SOLN
INTRAVENOUS | Status: DC | PRN
  Administered 2020-07-26: 500 mL

## 2020-07-26 MED ORDER — OXYCODONE HCL 5 MG PO TABS
ORAL_TABLET | ORAL | Status: AC
  Filled 2020-07-26: qty 1

## 2020-07-26 MED ORDER — DEXAMETHASONE SODIUM PHOSPHATE 10 MG/ML IJ SOLN
INTRAMUSCULAR | Status: AC
  Filled 2020-07-26: qty 1

## 2020-07-26 MED ORDER — PHENYLEPHRINE HCL-NACL 10-0.9 MG/250ML-% IV SOLN
INTRAVENOUS | Status: DC | PRN
  Administered 2020-07-26: 20 ug/min via INTRAVENOUS

## 2020-07-26 MED ORDER — LIDOCAINE 2% (20 MG/ML) 5 ML SYRINGE
INTRAMUSCULAR | Status: AC
  Filled 2020-07-26: qty 5

## 2020-07-26 MED ORDER — OXYCODONE HCL 5 MG PO TABS
5.0000 mg | ORAL_TABLET | Freq: Once | ORAL | Status: AC | PRN
  Administered 2020-07-26: 5 mg via ORAL

## 2020-07-26 SURGICAL SUPPLY — 51 items
BAG DECANTER FOR FLEXI CONT (MISCELLANEOUS) ×4 IMPLANT
BLADE CLIPPER SURG (BLADE) IMPLANT
BLADE DERMATOME SS (BLADE) ×2 IMPLANT
BNDG ELASTIC 4X5.8 VLCR STR LF (GAUZE/BANDAGES/DRESSINGS) IMPLANT
BNDG ELASTIC 6X5.8 VLCR STR LF (GAUZE/BANDAGES/DRESSINGS) IMPLANT
BNDG GAUZE ELAST 4 BULKY (GAUZE/BANDAGES/DRESSINGS) IMPLANT
BRUSH SCRUB EZ PLAIN DRY (MISCELLANEOUS) ×4 IMPLANT
CANISTER SUCT 3000ML PPV (MISCELLANEOUS) IMPLANT
CANISTER WOUND CARE 500ML ATS (WOUND CARE) IMPLANT
COVER SURGICAL LIGHT HANDLE (MISCELLANEOUS) ×2 IMPLANT
DERMACARRIERS GRAFT 1 TO 1.5 (DISPOSABLE) ×2
DRAPE EXTREMITY T 121X128X90 (DISPOSABLE) IMPLANT
DRAPE HALF SHEET 40X57 (DRAPES) ×2 IMPLANT
DRAPE INCISE IOBAN 66X45 STRL (DRAPES) ×2 IMPLANT
DRAPE ORTHO SPLIT 77X108 STRL (DRAPES) ×4
DRAPE SURG ORHT 6 SPLT 77X108 (DRAPES) ×2 IMPLANT
DRSG CALCIUM ALGINATE 4X4 (GAUZE/BANDAGES/DRESSINGS) ×2 IMPLANT
DRSG MEPITEL 4X7.2 (GAUZE/BANDAGES/DRESSINGS) IMPLANT
DRSG OPSITE 6X11 MED (GAUZE/BANDAGES/DRESSINGS) IMPLANT
DRSG PAD ABDOMINAL 8X10 ST (GAUZE/BANDAGES/DRESSINGS) IMPLANT
DRSG VAC ATS LRG SENSATRAC (GAUZE/BANDAGES/DRESSINGS) IMPLANT
DRSG VAC ATS MED SENSATRAC (GAUZE/BANDAGES/DRESSINGS) IMPLANT
DRSG VAC ATS SM SENSATRAC (GAUZE/BANDAGES/DRESSINGS) IMPLANT
ELECT REM PT RETURN 9FT ADLT (ELECTROSURGICAL)
ELECTRODE REM PT RTRN 9FT ADLT (ELECTROSURGICAL) IMPLANT
FILTER STRAW FLUID ASPIR (MISCELLANEOUS) ×2 IMPLANT
GAUZE SPONGE 4X4 12PLY STRL (GAUZE/BANDAGES/DRESSINGS) ×4 IMPLANT
GAUZE XEROFORM 5X9 LF (GAUZE/BANDAGES/DRESSINGS) IMPLANT
GLOVE BIOGEL M STRL SZ7.5 (GLOVE) ×4 IMPLANT
GLOVE INDICATOR 8.0 STRL GRN (GLOVE) ×4 IMPLANT
GOWN STRL REUS W/ TWL LRG LVL3 (GOWN DISPOSABLE) ×2 IMPLANT
GOWN STRL REUS W/TWL LRG LVL3 (GOWN DISPOSABLE) ×4
GRAFT DERMACARRIERS 1 TO 1.5 (DISPOSABLE) ×1 IMPLANT
HANDPIECE INTERPULSE COAX TIP (DISPOSABLE)
IV NS 1000ML (IV SOLUTION) ×2
IV NS 1000ML BAXH (IV SOLUTION) ×1 IMPLANT
KIT BASIN OR (CUSTOM PROCEDURE TRAY) ×2 IMPLANT
KIT TURNOVER KIT B (KITS) ×2 IMPLANT
NEEDLE SPNL 18GX3.5 QUINCKE PK (NEEDLE) ×2 IMPLANT
NS IRRIG 1000ML POUR BTL (IV SOLUTION) ×2 IMPLANT
PACK GENERAL/GYN (CUSTOM PROCEDURE TRAY) ×2 IMPLANT
PAD ARMBOARD 7.5X6 YLW CONV (MISCELLANEOUS) ×4 IMPLANT
SET HNDPC FAN SPRY TIP SCT (DISPOSABLE) IMPLANT
STAPLER VISISTAT 35W (STAPLE) IMPLANT
SUT CHROMIC 4 0 PS 2 18 (SUTURE) IMPLANT
SUT PDS AB 3-0 SH 27 (SUTURE) IMPLANT
SYR 50ML LL SCALE MARK (SYRINGE) ×4 IMPLANT
SYR CONTROL 10ML LL (SYRINGE) ×2 IMPLANT
TOWEL GREEN STERILE (TOWEL DISPOSABLE) ×2 IMPLANT
TOWEL GREEN STERILE FF (TOWEL DISPOSABLE) ×2 IMPLANT
UNDERPAD 30X36 HEAVY ABSORB (UNDERPADS AND DIAPERS) ×2 IMPLANT

## 2020-07-26 NOTE — Transfer of Care (Signed)
Immediate Anesthesia Transfer of Care Note  Patient: Morgan [name]  Procedure(s) Performed: SKIN GRAFT SPLIT THICKNESS (Left Knee) APPLICATION OF WOUND VAC (Left Knee)  Patient Location: PACU  Anesthesia Type:General  Level of Consciousness: drowsy, patient cooperative and responds to stimulation  Airway & Oxygen Therapy: Patient Spontanous Breathing and Patient connected to face mask oxygen  Post-op Assessment: Report given to RN and Post -op Vital signs reviewed and stable  Post vital signs: Reviewed and stable  Last Vitals:  Vitals Value Taken Time  BP    Temp    Pulse 80 07/26/20 1439  Resp 19 07/26/20 1439  SpO2 100 % 07/26/20 1439  Vitals shown include unvalidated device data.  Last Pain:  Vitals:   07/26/20 1104  TempSrc:   PainSc: 4       Patients Stated Pain Goal: 3 (83/35/82 5189)  Complications: No complications documented.

## 2020-07-26 NOTE — Anesthesia Preprocedure Evaluation (Addendum)
Anesthesia Evaluation  Patient identified by MRN, date of birth, ID band Patient awake    Reviewed: Allergy & Precautions, NPO status , Patient's Chart, lab work & pertinent test results  History of Anesthesia Complications (+) PONV  Airway Mallampati: II  TM Distance: >3 FB Neck ROM: Full    Dental  (+) Dental Advisory Given, Teeth Intact   Pulmonary former smoker,  07/22/2020 SARS coronavirus NEG   breath sounds clear to auscultation       Cardiovascular hypertension, Pt. on medications (-) angina Rhythm:Regular Rate:Normal     Neuro/Psych negative neurological ROS     GI/Hepatic GERD  Controlled,NASH   Endo/Other  Morbid obesity  Renal/GU negative Renal ROS     Musculoskeletal  (+) Arthritis , Rheumatoid disorders and steroids,    Abdominal (+) + obese,   Peds  Hematology negative hematology ROS (+)   Anesthesia Other Findings   Reproductive/Obstetrics                            Anesthesia Physical Anesthesia Plan  ASA: III  Anesthesia Plan: General   Post-op Pain Management:    Induction: Intravenous  PONV Risk Score and Plan: 4 or greater and Ondansetron, Dexamethasone and Treatment may vary due to age or medical condition  Airway Management Planned: LMA  Additional Equipment: None  Intra-op Plan:   Post-operative Plan:   Informed Consent: I have reviewed the patients History and Physical, chart, labs and discussed the procedure including the risks, benefits and alternatives for the proposed anesthesia with the patient or authorized representative who has indicated his/her understanding and acceptance.     Dental advisory given  Plan Discussed with: CRNA and Surgeon  Anesthesia Plan Comments:        Anesthesia Quick Evaluation

## 2020-07-26 NOTE — Interval H&P Note (Signed)
History and Physical Interval Note:  07/26/2020 12:12 PM  Morgan [name]  has presented today for surgery, with the diagnosis of open wound of knee.  The various methods of treatment have been discussed with the patient and family. After consideration of risks, benefits and other options for treatment, the patient has consented to  Procedure(s): SKIN GRAFT SPLIT THICKNESS (Left) APPLICATION OF WOUND VAC (Left) as a surgical intervention.  The patient's history has been reviewed, patient examined, no change in status, stable for surgery.  I have reviewed the patient's chart and labs.  Questions were answered to the patient's satisfaction.     [name]

## 2020-07-26 NOTE — Anesthesia Postprocedure Evaluation (Signed)
Anesthesia Post Note  Patient: Morgan [name]  Procedure(s) Performed: SKIN GRAFT SPLIT THICKNESS (Left Knee) APPLICATION OF WOUND VAC (Left Knee)     Patient location during evaluation: PACU Anesthesia Type: General Level of consciousness: awake and alert, patient cooperative and oriented Pain management: pain level controlled Vital Signs Assessment: post-procedure vital signs reviewed and stable Respiratory status: spontaneous breathing, nonlabored ventilation and respiratory function stable Cardiovascular status: blood pressure returned to baseline and stable Postop Assessment: no apparent nausea or vomiting Anesthetic complications: no   No complications documented.  Last Vitals:  Vitals:   07/26/20 1440 07/26/20 1453  BP: (!) 145/67 (!) 147/73  Pulse: 80 78  Resp: 19 17  Temp: 36.4 C   SpO2: 100% 93%    Last Pain:  Vitals:   07/26/20 1440  TempSrc:   PainSc: 5                  Bellany Elbaum,E. Yaron Grasse

## 2020-07-26 NOTE — Discharge Instructions (Addendum)
Activity As tolerated: NO showers with wound vac on. Keep leg brace in place for 1 week. Keep wound vac in place for 1 week. NO driving No heavy activities  Diet: Regular  Wound Care: Keep dressing clean & dry. You may change the dressing on your upper left thigh. Only change the gauze/abd pad as needed. Below is a yellow ioban tape that is covering a dressing. We will remove this in 1 week.  Post-op pain meds sent to pharmacy, oxycodone 5mg  IR. You can take this every 6 hours as needed. Between doses of oxycodone, you can take tylenol.   Special Instructions:  Call Doctor if any unusual problems occur such as pain, excessive Bleeding, unrelieved Nausea/vomiting, Fever &/or chills   Follow-up appointment: Scheduled for next week.

## 2020-07-26 NOTE — Brief Op Note (Signed)
07/26/2020  2:30 PM  PATIENT:  [name]  69 y.o. female  PRE-OPERATIVE DIAGNOSIS:  open wound of knee  POST-OPERATIVE DIAGNOSIS:  open wound of knee  PROCEDURE:  Procedure(s) with comments: SKIN GRAFT SPLIT THICKNESS (Left) - SKIN GRAFT SPLIT THICKNESS APPLICATION OF WOUND VAC (Left) - APPLICATION OF WOUND VAC  SURGEON:  Surgeon(s) and Role:    * Jerine Surles, Steffanie Dunn, MD - Primary  PHYSICIAN ASSISTANT: Software engineer, PA  ASSISTANTS: none   ANESTHESIA:   none  EBL:  5 mL   BLOOD ADMINISTERED:none  DRAINS: none   LOCAL MEDICATIONS USED:  MARCAINE     SPECIMEN:  No Specimen  DISPOSITION OF SPECIMEN:  PATHOLOGY  COUNTS:  YES  TOURNIQUET:  * No tourniquets in log *  DICTATION: .Dragon Dictation  PLAN OF CARE: Discharge to home after PACU  PATIENT DISPOSITION:  PACU - hemodynamically stable.   Delay start of Pharmacological VTE agent (>24hrs) due to surgical blood loss or risk of bleeding: not applicable

## 2020-07-26 NOTE — Op Note (Signed)
Operative Note   DATE OF OPERATION: 07/26/2020  SURGICAL DEPARTMENT: Plastic Surgery  PREOPERATIVE DIAGNOSES:  Left knee wound  POSTOPERATIVE DIAGNOSES:  same  PROCEDURE:  1. Surgical preparation for graft left knee wound 6x4cm 2. Split thickness skin graft left knee wound 6x4cm 3. Wound VAC application left knee 1O1WR  SURGEON: Talmadge Coventry, MD  ASSISTANT: Verdie Shire, PA The advanced practice practitioner (APP) assisted throughout the case.  The APP was essential in retraction and counter traction when needed to make the case progress smoothly.  This retraction and assistance made it possible to see the tissue plans for the procedure.  The assistance was needed for blood control, tissue re-approximation and assisted with closure of the incision site.  ANESTHESIA:  General.   COMPLICATIONS: None.   INDICATIONS FOR PROCEDURE:  The patient, Morgan [name] is a 69 y.o. female born on 1951/05/20, is here for treatment of left knee wound. MRN: [id number]  CONSENT:  Informed consent was obtained directly from the patient. Risks, benefits and alternatives were fully discussed. Specific risks including but not limited to bleeding, infection, hematoma, seroma, scarring, pain, contracture, asymmetry, wound healing problems, and need for further surgery were all discussed. The patient did have an ample opportunity to have questions answered to satisfaction.   DESCRIPTION OF PROCEDURE:  The patient was taken to the operating room. SCDs were placed and Ancef antibiotics were given. General anesthesia was administered.  The patient's operative site was prepped and draped in a sterile fashion. A time out was performed and all information was confirmed to be correct.  I started by evaluating the wound.  Healthy granulation tissue covered the whole base with the exception of two small areas of exposed tendon less than 68mm in size.  I debrided the wound with bevel of 10 blade and irrigated with  antibiotic solution.  The left thigh was then infiltrated with tumescent.  A split thickness skin graft was then harvested with a dermatome at 14/1000 of an inch.  It was meshed 1.5-1.  It was inset with staples.  It was dressed with Mepitel, black sponge and wound vac applied with good seal.  Donor site was dressed with calcium alginate and ioban.  Her leg was then wrapped and knee immobilizer replaced.    The patient tolerated the procedure well.  There were no complications. The patient was allowed to wake from anesthesia, extubated and taken to the recovery room in satisfactory condition.

## 2020-07-27 ENCOUNTER — Encounter (HOSPITAL_COMMUNITY): Payer: Self-pay | Admitting: Plastic Surgery

## 2020-07-27 DIAGNOSIS — T8189XA Other complications of procedures, not elsewhere classified, initial encounter: Secondary | ICD-10-CM | POA: Diagnosis not present

## 2020-07-28 DIAGNOSIS — T8189XA Other complications of procedures, not elsewhere classified, initial encounter: Secondary | ICD-10-CM | POA: Diagnosis not present

## 2020-07-29 DIAGNOSIS — T8189XA Other complications of procedures, not elsewhere classified, initial encounter: Secondary | ICD-10-CM | POA: Diagnosis not present

## 2020-07-30 DIAGNOSIS — T8189XA Other complications of procedures, not elsewhere classified, initial encounter: Secondary | ICD-10-CM | POA: Diagnosis not present

## 2020-07-31 DIAGNOSIS — T8189XA Other complications of procedures, not elsewhere classified, initial encounter: Secondary | ICD-10-CM | POA: Diagnosis not present

## 2020-08-01 DIAGNOSIS — T8189XA Other complications of procedures, not elsewhere classified, initial encounter: Secondary | ICD-10-CM | POA: Diagnosis not present

## 2020-08-02 DIAGNOSIS — T8189XA Other complications of procedures, not elsewhere classified, initial encounter: Secondary | ICD-10-CM | POA: Diagnosis not present

## 2020-08-03 ENCOUNTER — Telehealth: Payer: Self-pay | Admitting: *Deleted

## 2020-08-03 ENCOUNTER — Other Ambulatory Visit: Payer: Self-pay

## 2020-08-03 ENCOUNTER — Ambulatory Visit (INDEPENDENT_AMBULATORY_CARE_PROVIDER_SITE_OTHER): Payer: BC Managed Care – PPO | Admitting: Plastic Surgery

## 2020-08-03 ENCOUNTER — Encounter: Payer: Self-pay | Admitting: Plastic Surgery

## 2020-08-03 VITALS — BP 129/65 | HR 96 | Temp 98.9°F

## 2020-08-03 DIAGNOSIS — T8189XA Other complications of procedures, not elsewhere classified, initial encounter: Secondary | ICD-10-CM | POA: Diagnosis not present

## 2020-08-03 DIAGNOSIS — S81002D Unspecified open wound, left knee, subsequent encounter: Secondary | ICD-10-CM

## 2020-08-03 NOTE — Progress Notes (Signed)
Patient is here 1 week out from split-thickness skin graft to left knee wound.  She feels like she is doing fine and is anxious to see the results.  I remove the wound VAC myself noting poor take of the graft.  The majority of it looks to have sloughed off.  Otherwise the wound itself is gradually healing in from the edges and there is minimal drainage.  She was obviously disappointed as was I.  We did discuss this as a possibility had a time.  We have agreed to continue the wound VAC for another 2 weeks and then to reevaluate.  We will plan to do Mepitel followed by the black sponge for the knee wound which is 4 x 4 cm.  For the donor site we will plan to do Xeroform, 4 x 4's, ABDs secured with either wrap or tape.  The donor site size is about 10 x 8 cm.

## 2020-08-03 NOTE — Telephone Encounter (Addendum)
Faxed order to [facility] from medical supplies for the patient.  Confirmation received.  Copy scanned into the chart.//AB/CMA   Supplies:Xeroform (5x9)                Gauze                ABD pads               Tape                Kerlix

## 2020-08-04 ENCOUNTER — Encounter: Payer: Self-pay | Admitting: Plastic Surgery

## 2020-08-04 DIAGNOSIS — S81002A Unspecified open wound, left knee, initial encounter: Secondary | ICD-10-CM | POA: Diagnosis not present

## 2020-08-04 DIAGNOSIS — T8189XA Other complications of procedures, not elsewhere classified, initial encounter: Secondary | ICD-10-CM | POA: Diagnosis not present

## 2020-08-05 DIAGNOSIS — T8189XA Other complications of procedures, not elsewhere classified, initial encounter: Secondary | ICD-10-CM | POA: Diagnosis not present

## 2020-08-06 DIAGNOSIS — T8189XA Other complications of procedures, not elsewhere classified, initial encounter: Secondary | ICD-10-CM | POA: Diagnosis not present

## 2020-08-07 ENCOUNTER — Encounter: Payer: Self-pay | Admitting: Family Medicine

## 2020-08-07 DIAGNOSIS — T8189XA Other complications of procedures, not elsewhere classified, initial encounter: Secondary | ICD-10-CM | POA: Diagnosis not present

## 2020-08-08 DIAGNOSIS — T8189XA Other complications of procedures, not elsewhere classified, initial encounter: Secondary | ICD-10-CM | POA: Diagnosis not present

## 2020-08-09 DIAGNOSIS — T8189XA Other complications of procedures, not elsewhere classified, initial encounter: Secondary | ICD-10-CM | POA: Diagnosis not present

## 2020-08-10 DIAGNOSIS — T8189XA Other complications of procedures, not elsewhere classified, initial encounter: Secondary | ICD-10-CM | POA: Diagnosis not present

## 2020-08-11 DIAGNOSIS — T8189XA Other complications of procedures, not elsewhere classified, initial encounter: Secondary | ICD-10-CM | POA: Diagnosis not present

## 2020-08-12 DIAGNOSIS — T8189XA Other complications of procedures, not elsewhere classified, initial encounter: Secondary | ICD-10-CM | POA: Diagnosis not present

## 2020-08-13 DIAGNOSIS — T8189XA Other complications of procedures, not elsewhere classified, initial encounter: Secondary | ICD-10-CM | POA: Diagnosis not present

## 2020-08-14 DIAGNOSIS — T8189XA Other complications of procedures, not elsewhere classified, initial encounter: Secondary | ICD-10-CM | POA: Diagnosis not present

## 2020-08-15 DIAGNOSIS — T8189XA Other complications of procedures, not elsewhere classified, initial encounter: Secondary | ICD-10-CM | POA: Diagnosis not present

## 2020-08-16 DIAGNOSIS — T8189XA Other complications of procedures, not elsewhere classified, initial encounter: Secondary | ICD-10-CM | POA: Diagnosis not present

## 2020-08-17 ENCOUNTER — Ambulatory Visit (INDEPENDENT_AMBULATORY_CARE_PROVIDER_SITE_OTHER): Payer: BC Managed Care – PPO | Admitting: Surgical

## 2020-08-17 ENCOUNTER — Other Ambulatory Visit: Payer: Self-pay

## 2020-08-17 ENCOUNTER — Encounter: Payer: Self-pay | Admitting: Surgical

## 2020-08-17 VITALS — BP 144/84 | HR 89 | Temp 98.5°F

## 2020-08-17 DIAGNOSIS — S81002D Unspecified open wound, left knee, subsequent encounter: Secondary | ICD-10-CM

## 2020-08-17 DIAGNOSIS — T8189XA Other complications of procedures, not elsewhere classified, initial encounter: Secondary | ICD-10-CM | POA: Diagnosis not present

## 2020-08-17 NOTE — Progress Notes (Signed)
Patient is a 69 year old female here for follow-up from split-thickness skin graft to her left knee wound on 07/26/2020 with Dr. Claudia Desanctis.  Patient was last seen on 08/03/2020 it was noted that unfortunately the skin graft did not take.  Patient reports that her daughter has been helping her with wound VAC changes over the last 2 weeks.  They have been applying nystatin powder around the left knee wound due to concern for fungal growth.  Patient's daughter is a Marine scientist.  On exam left knee wound with good base of granulation tissue, some maceration and skin irritation noted around the periwound area.  There is no foul odor noted.  The area is tender to palpation.  There is no purulence.  She does have an area where there is some loss of granulation tissue centrally and exposed tendon is noted.  The wound is 3 x 4 cm.  No cellulitic changes.  Left thigh donor site is healing nicely, no surrounding erythema, no drainage, the majority of the area has epithelialized with the exception of a small wound on the medial portion that is approximately 2 mm.  Wound VAC removed, will begin using collagen dressing changes every other day to assist with epithelialization.  After application of collagen, recommend applying Mepitel or Adaptic followed by 4 x 4 gauze, Kerlix, Ace wrap, knee immobilizer.  Will hold off on using the wound VAC due to the periwound area being very irritated, she has a good base of granulation tissue with the exception of the central portion.  I suspect this area will granulate with time.  There is no sign of any infection.   Patient to remain out of work due to inability to sit at desk for extended periods of time with knee immobilizer.  We will fax office notes to [facility] for patient.

## 2020-08-18 DIAGNOSIS — T8189XA Other complications of procedures, not elsewhere classified, initial encounter: Secondary | ICD-10-CM | POA: Diagnosis not present

## 2020-08-31 ENCOUNTER — Encounter: Payer: Self-pay | Admitting: Plastic Surgery

## 2020-08-31 ENCOUNTER — Ambulatory Visit (INDEPENDENT_AMBULATORY_CARE_PROVIDER_SITE_OTHER): Payer: BC Managed Care – PPO | Admitting: Plastic Surgery

## 2020-08-31 ENCOUNTER — Other Ambulatory Visit: Payer: Self-pay

## 2020-08-31 VITALS — BP 135/75 | HR 82 | Temp 98.2°F

## 2020-08-31 DIAGNOSIS — S81002D Unspecified open wound, left knee, subsequent encounter: Secondary | ICD-10-CM

## 2020-08-31 NOTE — Progress Notes (Signed)
Patient presents for further evaluation of her left knee wound.  I applied a split-thickness skin graft about a month ago and very little if any of it has taken.  The wound has been showing signs of progress and we have been doing collagen dressings and have discontinued the wound VAC.  On examination there is hyper granulation tissue but no exposed tendon.  She has a little bit of tenderness superior to the wound but no signs of infection.  I applied some silver nitrate to the wound today and reapplied the collagen dressing.  We will continue to do this every other day and will follow up with her again in 2 weeks.

## 2020-09-13 ENCOUNTER — Telehealth: Payer: Self-pay | Admitting: *Deleted

## 2020-09-13 NOTE — Telephone Encounter (Addendum)
Received V.A.C. Wound Progress Tracking on (09/07/20) via of fax.  Requesting weekly wound assessments.  Given to provider to complete.  Tracking completed and faxed back to [facility].  Confirmation received and copy scanned into the chart.//AB/CMA

## 2020-09-14 ENCOUNTER — Encounter: Payer: Self-pay | Admitting: Plastic Surgery

## 2020-09-14 ENCOUNTER — Ambulatory Visit (INDEPENDENT_AMBULATORY_CARE_PROVIDER_SITE_OTHER): Payer: BC Managed Care – PPO | Admitting: Plastic Surgery

## 2020-09-14 ENCOUNTER — Other Ambulatory Visit: Payer: Self-pay

## 2020-09-14 VITALS — BP 171/84 | HR 91 | Temp 98.5°F

## 2020-09-14 DIAGNOSIS — S81002D Unspecified open wound, left knee, subsequent encounter: Secondary | ICD-10-CM

## 2020-09-14 NOTE — Progress Notes (Signed)
Patient is here about 6 weeks out from split-thickness skin graft.  This did not take and we have been treating her hyper granulation tissue with silver nitrate.  She does feel like the wound is getting smaller but the hyper granulation tissue has returned.  On examination the wound does look like it is continuing to migrate in from the periphery but there is hyper granulation.  They are still doing collagen and covering it with a soft dressing.  I provided another treatment of silver nitrate to try to help with this and replace the collagen dressing.  We will plan to follow-up with her again in a couple weeks to see if another treatment is necessary.  She is planning to return to work part-time in the next few weeks and I think this is great.  We will fill out the supplemental paperwork that she provided for [place].  We will plan to continue the medial immobilizer most of the time and I will see her back in the next few weeks.

## 2020-09-15 ENCOUNTER — Telehealth: Payer: Self-pay

## 2020-09-15 DIAGNOSIS — S81002A Unspecified open wound, left knee, initial encounter: Secondary | ICD-10-CM | POA: Diagnosis not present

## 2020-09-15 NOTE — Telephone Encounter (Signed)
Patient called to let us know that she needs more Fibracol Plus (Collagen Dressing)...she needs two boxes.  [facility] needs a new prescription.  Please call.

## 2020-09-19 DIAGNOSIS — S81002A Unspecified open wound, left knee, initial encounter: Secondary | ICD-10-CM | POA: Diagnosis not present

## 2020-09-19 NOTE — Telephone Encounter (Signed)
Faxed order to [facility] for refill on (Fibracol Plus Collagen). Confirmation received and copy scanned into the chart.//AB/CMA

## 2020-09-20 ENCOUNTER — Telehealth: Payer: Self-pay | Admitting: *Deleted

## 2020-09-20 NOTE — Telephone Encounter (Signed)
Received V.A.C. Wound Progress Tracking via of fax from [facility].  Requesting weekly wound assessments for re-authorization of VAC Therapy.    Faxed Progress Tracking back letting [facility] know that the wound vac was stopped on (08/17/20) to allow periwound area to heal due to skin irritation.  Confirmation received and copy scanned into the chart.//AB/CMA

## 2020-09-20 NOTE — Telephone Encounter (Signed)
Received Order Status Notification on (09/19/20) from [facility].  Stating [facility] has provided service for the patient; no further action is required.//AB/CMA

## 2020-09-22 ENCOUNTER — Other Ambulatory Visit: Payer: Self-pay

## 2020-09-22 ENCOUNTER — Encounter: Payer: Self-pay | Admitting: Family Medicine

## 2020-09-22 ENCOUNTER — Ambulatory Visit (INDEPENDENT_AMBULATORY_CARE_PROVIDER_SITE_OTHER): Payer: BC Managed Care – PPO

## 2020-09-22 DIAGNOSIS — Z23 Encounter for immunization: Secondary | ICD-10-CM

## 2020-09-23 ENCOUNTER — Telehealth: Payer: Self-pay | Admitting: *Deleted

## 2020-09-23 NOTE — Telephone Encounter (Signed)
Received discharge orders from [facility] on (09/21/20) via of fax.  Requesting to be signed and returned.  Given to provider to sign.  Discharge orders signed and faxed back to [facility] on (09/21/20).  Confirmation received and copy scanned into the chart.//AB/CMA

## 2020-09-26 ENCOUNTER — Encounter: Payer: Self-pay | Admitting: Plastic Surgery

## 2020-09-26 ENCOUNTER — Ambulatory Visit (INDEPENDENT_AMBULATORY_CARE_PROVIDER_SITE_OTHER): Payer: BC Managed Care – PPO | Admitting: Plastic Surgery

## 2020-09-26 ENCOUNTER — Other Ambulatory Visit: Payer: Self-pay

## 2020-09-26 VITALS — BP 145/79 | HR 73 | Temp 98.3°F

## 2020-09-26 DIAGNOSIS — S81002D Unspecified open wound, left knee, subsequent encounter: Secondary | ICD-10-CM

## 2020-09-26 NOTE — Progress Notes (Signed)
Patient presents about 2 months out from a skin graft to her knee wound.  This did not take and has been healing secondarily since then slowly.  I been doing silver nitrate over the hyper granulation tissue.  She feels like things are progressing today and on exam the wound has shrunk in size a bit more.  It is probably 2 cm in diameter at this point with persistent hyper granulation tissue.  I did apply silver nitrate again.  There is some adjacent superficial skin breakdown that is attributed to the knee immobilizer.  We will cover this with Vaseline and Xeroform at this point.  I will plan to see her back again in 2 weeks to check her progress.  All her questions were answered.

## 2020-09-28 DIAGNOSIS — M150 Primary generalized (osteo)arthritis: Secondary | ICD-10-CM | POA: Diagnosis not present

## 2020-09-28 DIAGNOSIS — M2550 Pain in unspecified joint: Secondary | ICD-10-CM | POA: Diagnosis not present

## 2020-09-28 DIAGNOSIS — M0609 Rheumatoid arthritis without rheumatoid factor, multiple sites: Secondary | ICD-10-CM | POA: Diagnosis not present

## 2020-09-28 DIAGNOSIS — Z111 Encounter for screening for respiratory tuberculosis: Secondary | ICD-10-CM | POA: Diagnosis not present

## 2020-09-28 DIAGNOSIS — L4050 Arthropathic psoriasis, unspecified: Secondary | ICD-10-CM | POA: Diagnosis not present

## 2020-10-03 ENCOUNTER — Other Ambulatory Visit: Payer: Self-pay

## 2020-10-03 ENCOUNTER — Telehealth: Payer: Self-pay | Admitting: Plastic Surgery

## 2020-10-03 ENCOUNTER — Ambulatory Visit
Admission: RE | Admit: 2020-10-03 | Discharge: 2020-10-03 | Disposition: A | Discharge status: Home / Self Care | Payer: BC Managed Care – PPO | Source: Ambulatory Visit | Attending: Family Medicine | Admitting: Family Medicine

## 2020-10-03 DIAGNOSIS — Z1231 Encounter for screening mammogram for malignant neoplasm of breast: Secondary | ICD-10-CM

## 2020-10-03 NOTE — Telephone Encounter (Signed)
Patient lvm requesting urgent callback regarding FMLA papers to [facility]. There is a discrepancy and she needs it to be fixed quickly. Called patient back to advise there are no providers until this afternoon. Please call patient back so she can advise which box the provider should have checked on the form so it can be refaxed. [phone]

## 2020-10-06 ENCOUNTER — Encounter: Payer: Self-pay | Admitting: Plastic Surgery

## 2020-10-06 NOTE — Telephone Encounter (Signed)
fyi

## 2020-10-06 NOTE — Telephone Encounter (Signed)
Please send the requested office notes to the leave person at [facility]. Thanks.

## 2020-10-12 ENCOUNTER — Ambulatory Visit (INDEPENDENT_AMBULATORY_CARE_PROVIDER_SITE_OTHER): Payer: BC Managed Care – PPO | Admitting: Plastic Surgery

## 2020-10-12 ENCOUNTER — Other Ambulatory Visit: Payer: Self-pay

## 2020-10-12 ENCOUNTER — Encounter: Payer: Self-pay | Admitting: Plastic Surgery

## 2020-10-12 VITALS — BP 150/61 | HR 89 | Temp 98.2°F

## 2020-10-12 DIAGNOSIS — S81002D Unspecified open wound, left knee, subsequent encounter: Secondary | ICD-10-CM

## 2020-10-12 NOTE — Progress Notes (Signed)
Patient is here almost 3 months out from her skin graft to her knee wound.  The graft did not take very well but she is made progress healing this secondarily.  The wound is now about a centimeter in diameter with some hyper granulation tissue.  I applied silver nitrate to knock this down a bit and will continue to do her wound care as is being done and I will plan to see her in another couple weeks.  All of her questions were answered.

## 2020-10-26 ENCOUNTER — Ambulatory Visit (INDEPENDENT_AMBULATORY_CARE_PROVIDER_SITE_OTHER): Payer: BC Managed Care – PPO | Admitting: Plastic Surgery

## 2020-10-26 ENCOUNTER — Telehealth: Payer: Self-pay

## 2020-10-26 ENCOUNTER — Other Ambulatory Visit: Payer: Self-pay

## 2020-10-26 ENCOUNTER — Encounter: Payer: Self-pay | Admitting: Plastic Surgery

## 2020-10-26 DIAGNOSIS — S81002D Unspecified open wound, left knee, subsequent encounter: Secondary | ICD-10-CM

## 2020-10-26 NOTE — Progress Notes (Signed)
Patient is a 69 year old female who is 3 months out from her skin graft to her left knee wound.  The graft did not take very well so she has been doing wound care to get the knee wound to heal.  Today she presents with her husband.  Denies fever/chills, nausea/vomiting.  Reports pain on the lateral aspect of the left knee.  The actual knee wound is now closed; however, over the last month or so she has developed skin breakdown on the lateral side of the knee.  Today there is some active bleeding where the skin has sloughed off.  There is significant bruising present.  Suspect skin breakdown from rubbing against knee immobilizer.  Recommend daily dressing changes consisting of Vaseline, Xeroform, and cover entire wound area with Mepilex border dressing.  Wound area to be covered measures 16.5 x 13 cm.  Using the Mepilex border dressing should reduce the friction from the knee immobilizer.  Follow-up in 2 to 3 weeks.  Provided return precautions.  Call office with any questions/concerns.  Order sent to [facility] for supplies.   Pictures were obtained of the patient and placed in the chart with the patient's or guardian's permission.

## 2020-10-26 NOTE — Telephone Encounter (Signed)
Faxed [facility] order for : Silicone bordered foam, roll guaze and xeroform to be changed daily

## 2020-10-31 DIAGNOSIS — S81002A Unspecified open wound, left knee, initial encounter: Secondary | ICD-10-CM | POA: Diagnosis not present

## 2020-11-07 ENCOUNTER — Other Ambulatory Visit: Payer: Self-pay | Admitting: Family Medicine

## 2020-11-07 NOTE — Progress Notes (Addendum)
Patient is a 69 year old female who underwent a skin graft to her left knee wound on 07/26/20 with Dr. Claudia Desanctis.  The graft did not take very well so she has been doing wound care to get the knee wound to heal.  At last visit on 11/24 the actual knee wound was closed; however, over the last month or so she developed skin breakdown on the lateral side of the knee.  Suspected rubbing against knee immobilizer.  Wound area measured 16.5 x 13 cm.  ~ 15 weeks PO Patient is accompanied today by her husband.  Denies fever/chills, nausea/vomiting.  They report they feel the wound is doing better overall but they had an edge of the Mepilex border slide down allowing the immobilizer to rub and so had some additional skin breakdown on the lateral superior aspect of the wound as well as the medial superior aspect.  Overall there is some good improvement in left leg wound.  Initial knee wound still remains closed and is healing nicely.  Skin breakdown from rubbing of the immobilizer has improved overall .   Request to be sent to [facility] for Mepilex border sacrum pressure ulcer prevention pads to cover the entire wound area for padding and to prevent rubbing of the bandages; (5 x 5 cm Mepilex border dressings were sent instead of something to cover a 16.5 x 13 cm area previously)    Daily dressing changes consisting of Vaseline, Xeroform, cover entire wound area with Mepilex border dressing. Recommend no longer using the immobilizer. Wrap with Kerlix and ACE wrap.  Follow-up in 3 to 4 weeks.  Return precautions provided.  Call office with any questions/concerns.   Pictures were obtained of the patient and placed in the chart with the patient's or guardian's permission.

## 2020-11-09 ENCOUNTER — Telehealth: Payer: Self-pay

## 2020-11-09 ENCOUNTER — Other Ambulatory Visit: Payer: Self-pay

## 2020-11-09 ENCOUNTER — Encounter: Payer: Self-pay | Admitting: Plastic Surgery

## 2020-11-09 ENCOUNTER — Ambulatory Visit (INDEPENDENT_AMBULATORY_CARE_PROVIDER_SITE_OTHER): Payer: BC Managed Care – PPO | Admitting: Plastic Surgery

## 2020-11-09 VITALS — BP 140/61 | HR 98 | Temp 97.8°F

## 2020-11-09 DIAGNOSIS — S81002D Unspecified open wound, left knee, subsequent encounter: Secondary | ICD-10-CM

## 2020-11-09 NOTE — Telephone Encounter (Signed)
Faxed order to [facility] for Mepilex sacrum pressure ulcer prevention pads to be changed daily.

## 2020-11-17 ENCOUNTER — Encounter: Payer: Self-pay | Admitting: Plastic Surgery

## 2020-11-25 ENCOUNTER — Encounter: Payer: Self-pay | Admitting: Plastic Surgery

## 2020-11-26 ENCOUNTER — Other Ambulatory Visit: Payer: Self-pay | Admitting: Family Medicine

## 2020-11-27 NOTE — Progress Notes (Signed)
Patient is a 69 year old female who underwent a skin graft to her left knee wound on 07/26/2020 with Dr. Claudia Desanctis.  The graft did not take very well so she has been doing wound care to get the knee wound to heal.  The actual knee wound has closed but over the last several visits she has developed skin breakdown on the lateral side of the knee; suspect rubbing against the knee immobilizer.  Skin breakdown area approximately 16.5 x 13 cm.  Recommended no longer using the immobilizer.  Dressing changes consisting of Vaseline, Xeroform, Mepilex border dressing.  Patient presents with her husband today.  They have been using Mepilex transfer dressing with some Vaseline.  They have only been using the immobilizer when she goes out (2-3 times total).  They report the knee is improving.  Patient denies fever/chills, nausea/vomiting, pain.  Breathing is nonlabored.  Knee wound remains intact, clean and dry.  Bruising and skin breakdown surrounding the previous knee wound has improved since last visit.  Recommend discontinuing use of knee immobilizer.  Recommend continuing to use thin layer of Vaseline, Mepitel transfer pad, ABD, and secure with tape as needed.  Follow-up in 4 to 6 weeks.  Return precautions provided.  Call office with any questions/concerns.   Pictures were obtained of the patient and placed in the chart with the patient's or guardian's permission.

## 2020-11-28 ENCOUNTER — Ambulatory Visit: Payer: BC Managed Care – PPO | Admitting: Family Medicine

## 2020-11-28 ENCOUNTER — Telehealth: Payer: Self-pay | Admitting: Plastic Surgery

## 2020-11-28 NOTE — Telephone Encounter (Signed)
Morgan [name] at [facility] verified they had Mepilex Transfer. Called husband back and advised that I will have to have a PA or physician to okay to change and sign order.  Husband understood and agreed.

## 2020-11-28 NOTE — Telephone Encounter (Signed)
Husband, [name], called to advise that patient sent in mychart message regarding ordering Meplex dressing 6x8 inches, 30 day supply. Please call patient to advise when complete.

## 2020-11-30 ENCOUNTER — Encounter: Payer: Self-pay | Admitting: Plastic Surgery

## 2020-11-30 ENCOUNTER — Other Ambulatory Visit: Payer: Self-pay

## 2020-11-30 ENCOUNTER — Ambulatory Visit (INDEPENDENT_AMBULATORY_CARE_PROVIDER_SITE_OTHER): Payer: BC Managed Care – PPO | Admitting: Plastic Surgery

## 2020-11-30 VITALS — BP 149/73 | HR 83 | Temp 98.0°F

## 2020-11-30 DIAGNOSIS — S81002D Unspecified open wound, left knee, subsequent encounter: Secondary | ICD-10-CM

## 2020-12-01 ENCOUNTER — Telehealth: Payer: Self-pay

## 2020-12-01 ENCOUNTER — Encounter: Payer: Self-pay | Admitting: Plastic Surgery

## 2020-12-01 DIAGNOSIS — S81002A Unspecified open wound, left knee, initial encounter: Secondary | ICD-10-CM | POA: Diagnosis not present

## 2020-12-01 NOTE — Telephone Encounter (Signed)
Faxed [facility] order : ABD pads and Mepilex transfer-daily changes

## 2020-12-03 HISTORY — PX: CATARACT EXTRACTION W/ INTRAOCULAR LENS IMPLANT: SHX1309

## 2020-12-09 ENCOUNTER — Telehealth: Payer: Self-pay

## 2020-12-09 ENCOUNTER — Encounter: Payer: Self-pay | Admitting: Plastic Surgery

## 2020-12-09 NOTE — Telephone Encounter (Signed)
Patient's husband called on behalf of the patient to say that he sent a MyChart message to Dr. Claudia Desanctis and Venetia Night to look at the wound on his wife's knee.  Please call.

## 2020-12-11 NOTE — Progress Notes (Signed)
Patient is a 70 year old female who underwent a skin graft to her left knee wound on 07/26/2020 with Dr. Claudia Desanctis. The graft did not take very well so she has been doing wound care to get the knee wound to heal. The actual knee wound has been healed since her 11/24 visit. She developed some skin breakdown on the medial and lateral sides of the knee with some bruising.   She presents today with concerns for a spot that was bleeding when they took the dressing off on 1/7.  Denies fever/chills, nausea/vomiting, pain.  Bruising has increased around the knee area and there for some few superficial skin wounds that have healed over and scabbed.  Appears that thin piece of skin may have pulled off when changing the dressing.  Original knee wound has remained healed and closed since prior to her November visit.  There are currently no open wounds.  No signs of infection or drainage.  Recommend taking a dressing vacation and leaving dressings off the knee for the next 1-2 weeks.  Recommend moisturizing with a very thin layer of Vaseline as needed to keep skin from drying out.  Follow-up currently scheduled for January 26.  Please keep this appointment.  Return precautions provided.  Call office with any questions/concerns.   Pictures were obtained of the patient and placed in the chart with the patient's or guardian's permission.

## 2020-12-12 ENCOUNTER — Ambulatory Visit: Payer: BC Managed Care – PPO | Admitting: Plastic Surgery

## 2020-12-12 ENCOUNTER — Other Ambulatory Visit: Payer: Self-pay

## 2020-12-12 ENCOUNTER — Encounter: Payer: Self-pay | Admitting: Plastic Surgery

## 2020-12-12 VITALS — BP 141/65 | HR 75

## 2020-12-12 DIAGNOSIS — S81002D Unspecified open wound, left knee, subsequent encounter: Secondary | ICD-10-CM | POA: Diagnosis not present

## 2020-12-15 DIAGNOSIS — H2513 Age-related nuclear cataract, bilateral: Secondary | ICD-10-CM | POA: Diagnosis not present

## 2020-12-15 DIAGNOSIS — H2512 Age-related nuclear cataract, left eye: Secondary | ICD-10-CM | POA: Diagnosis not present

## 2020-12-15 DIAGNOSIS — H40013 Open angle with borderline findings, low risk, bilateral: Secondary | ICD-10-CM | POA: Diagnosis not present

## 2020-12-21 ENCOUNTER — Encounter: Payer: BC Managed Care – PPO | Admitting: Family Medicine

## 2020-12-26 DIAGNOSIS — H25043 Posterior subcapsular polar age-related cataract, bilateral: Secondary | ICD-10-CM | POA: Diagnosis not present

## 2020-12-26 DIAGNOSIS — H2513 Age-related nuclear cataract, bilateral: Secondary | ICD-10-CM | POA: Diagnosis not present

## 2020-12-26 DIAGNOSIS — H18593 Other hereditary corneal dystrophies, bilateral: Secondary | ICD-10-CM | POA: Diagnosis not present

## 2020-12-26 DIAGNOSIS — H18453 Nodular corneal degeneration, bilateral: Secondary | ICD-10-CM | POA: Diagnosis not present

## 2020-12-27 ENCOUNTER — Encounter: Payer: Self-pay | Admitting: Family Medicine

## 2020-12-27 ENCOUNTER — Other Ambulatory Visit: Payer: Self-pay

## 2020-12-27 ENCOUNTER — Ambulatory Visit (INDEPENDENT_AMBULATORY_CARE_PROVIDER_SITE_OTHER): Payer: BC Managed Care – PPO | Admitting: Family Medicine

## 2020-12-27 VITALS — BP 140/84 | HR 56 | Temp 97.3°F | Resp 16 | Ht 64.0 in | Wt 218.6 lb

## 2020-12-27 DIAGNOSIS — M0579 Rheumatoid arthritis with rheumatoid factor of multiple sites without organ or systems involvement: Secondary | ICD-10-CM | POA: Diagnosis not present

## 2020-12-27 DIAGNOSIS — D696 Thrombocytopenia, unspecified: Secondary | ICD-10-CM | POA: Diagnosis not present

## 2020-12-27 DIAGNOSIS — Z9189 Other specified personal risk factors, not elsewhere classified: Secondary | ICD-10-CM | POA: Diagnosis not present

## 2020-12-27 DIAGNOSIS — D72828 Other elevated white blood cell count: Secondary | ICD-10-CM | POA: Diagnosis not present

## 2020-12-27 DIAGNOSIS — Z780 Asymptomatic menopausal state: Secondary | ICD-10-CM

## 2020-12-27 DIAGNOSIS — K760 Fatty (change of) liver, not elsewhere classified: Secondary | ICD-10-CM

## 2020-12-27 DIAGNOSIS — Z0000 Encounter for general adult medical examination without abnormal findings: Secondary | ICD-10-CM | POA: Diagnosis not present

## 2020-12-27 DIAGNOSIS — K219 Gastro-esophageal reflux disease without esophagitis: Secondary | ICD-10-CM

## 2020-12-27 DIAGNOSIS — Z7952 Long term (current) use of systemic steroids: Secondary | ICD-10-CM

## 2020-12-27 DIAGNOSIS — D849 Immunodeficiency, unspecified: Secondary | ICD-10-CM | POA: Diagnosis not present

## 2020-12-27 DIAGNOSIS — E782 Mixed hyperlipidemia: Secondary | ICD-10-CM

## 2020-12-27 DIAGNOSIS — I872 Venous insufficiency (chronic) (peripheral): Secondary | ICD-10-CM

## 2020-12-27 DIAGNOSIS — I10 Essential (primary) hypertension: Secondary | ICD-10-CM | POA: Diagnosis not present

## 2020-12-27 DIAGNOSIS — L409 Psoriasis, unspecified: Secondary | ICD-10-CM | POA: Insufficient documentation

## 2020-12-27 HISTORY — DX: Psoriasis, unspecified: L40.9

## 2020-12-27 MED ORDER — SHINGRIX 50 MCG/0.5ML IM SUSR: 0.5000 mL | Freq: Once | INTRAMUSCULAR | 0 refills | Status: AC

## 2020-12-27 MED ORDER — LISINOPRIL 10 MG PO TABS: 10.0000 mg | ORAL_TABLET | Freq: Every day | ORAL | 3 refills | Status: DC

## 2020-12-27 MED ORDER — HYDROXYCHLOROQUINE SULFATE 200 MG PO TABS: 400.0000 mg | ORAL_TABLET | Freq: Every day | ORAL | Status: DC

## 2020-12-27 MED ORDER — POTASSIUM CHLORIDE CRYS ER 20 MEQ PO TBCR: 20.0000 meq | EXTENDED_RELEASE_TABLET | Freq: Every day | ORAL | 3 refills | Status: AC | PRN

## 2020-12-27 MED ORDER — FUROSEMIDE 20 MG PO TABS: 20.0000 mg | ORAL_TABLET | Freq: Every day | ORAL | 3 refills | Status: AC | PRN

## 2020-12-27 NOTE — Patient Instructions (Signed)
Please return in 3 months to recheck leg swelling and blood pressure.   I will release your lab results to you on your MyChart account with further instructions. Please reply with any questions.   Please stop the hctz; start the lisinopril 10mg  dailiy for your blood pressure. You may use the lasix and potassium daily as needed to help with the leg swelling.   If you have any questions or concerns, please don't hesitate to send me a message via MyChart or call the office at [phone]. Thank you for visiting with [place] today! It's our pleasure caring for you.  Please take the prescription for Shingrix to the pharmacy so they may administer the vaccinations. Your insurance will then cover the injections.

## 2020-12-27 NOTE — Progress Notes (Signed)
Subjective  Chief Complaint  Patient presents with  . Annual Exam    fasting, cataract surgery on left eye scheduled tomorrow    HPI: Morgan [name] is a 70 y.o. female who presents to [facility] today for a Female Wellness Visit. She also has the concerns and/or needs as listed above in the chief complaint. These will be addressed in addition to the Health Maintenance Visit.   Wellness Visit: annual visit with health maintenance review and exam without Pap   HM: screens: mammo and dexa due in November. CRC screens up to date. Due shingrix. Other imms up to date. Eye exam up to date: for cataract surgery tomorrow Chronic disease f/u and/or acute problem visit: (deemed necessary to be done in addition to the wellness visit):  Multiple chronic medical problems.   RA: restarted meds since wound finally healing. Feeling better.  Immunosuppressed; chronic steroids: multiple secondary effects including reactive CBC related changes and skin changes with poor wound healing.   HTN: home control is good: has white coat response: home 120s/70s. On hctz. But c/o bilateral worsening leg edema. Has peripheral venous insufficiency. Worsening since so sedentary now due to knee wound. Better with leg elevation.   HLD on statin. Tolerates.   Fatty liver. No pain or jaundice  gerd controlled  Mood is good  psoriasis  Assessment  1. Annual physical exam   2. Immunosuppressed status ([place]) Chronic  3. Rheumatoid arthritis involving multiple sites with positive rheumatoid factor (HCC) Chronic  4. Thrombocytopenia (HCC) Chronic  5. Essential hypertension   6. Mixed hyperlipidemia   7. Fatty liver disease, nonalcoholic   8. Gastroesophageal reflux disease without esophagitis   9. Hypercalcemia   10. Other elevated white blood cell (WBC) count   11. Venous (peripheral) insufficiency   12. Current chronic use of systemic steroids   13. At high risk for osteoporosis    14. Asymptomatic menopausal state      Plan  Female Wellness Visit:  Age appropriate Health Maintenance and Prevention measures were discussed with patient. Included topics are cancer screening recommendations, ways to keep healthy (see AVS) including dietary and exercise recommendations, regular eye and dental care, use of seat belts, and avoidance of moderate alcohol use and tobacco use. mammo and dexa in novmeber  BMI: discussed patient's BMI and encouraged positive lifestyle modifications to help get to or maintain a target BMI.  HM needs and immunizations were addressed and ordered. See below for orders. See HM and immunization section for updates. rec shingrix. rx given  Routine labs and screening tests ordered including cmp, cbc and lipids where appropriate.  Discussed recommendations regarding Vit D and calcium supplementation (see AVS)  Chronic disease management visit and/or acute problem visit:  Chronic problems are mostly improving or controlled.  HTN with leg edema: start lasix w/ potassium as needed; stop hctz. Start ace. Monitor at home.  No other med changes today.   Follow up: No follow-ups on file.  Orders Placed This Encounter  Procedures  . DG Bone Density  . CBC with Differential/Platelet  . Comprehensive metabolic panel  . Lipid panel  . TSH  . PTH, intact (no Ca)   Meds ordered this encounter  Medications  . hydroxychloroquine (PLAQUENIL) 200 MG tablet    Sig: Take 2 tablets (400 mg total) by mouth daily.  Marland Kitchen Zoster Vaccine Adjuvanted [facility]) injection    Sig: Inject 0.5 mLs into the muscle once for 1 dose. Please give  2nd dose 2-6 months after first dose    Dispense:  2 each    Refill:  0  . furosemide (LASIX) 20 MG tablet    Sig: Take 1 tablet (20 mg total) by mouth daily as needed for edema. Take with potassium    Dispense:  90 tablet    Refill:  3  . potassium chloride SA (KLOR-CON) 20 MEQ tablet    Sig: Take 1 tablet (20 mEq total) by  mouth daily as needed (leg swelling). Take with furosemide    Dispense:  90 tablet    Refill:  3  . lisinopril (ZESTRIL) 10 MG tablet    Sig: Take 1 tablet (10 mg total) by mouth daily.    Dispense:  90 tablet    Refill:  3      Lifestyle: Body mass index is 37.52 kg/m. Wt Readings from Last 3 Encounters:  12/27/20 218 lb 9.6 oz (99.2 kg)  07/26/20 218 lb (98.9 kg)  06/09/20 211 lb (95.7 kg)     Patient Active Problem List   Diagnosis Date Noted  . Rheumatoid arthritis involving multiple sites with positive rheumatoid factor ([place]) 10/31/2017    Priority: High  . Immunosuppressed status ([place]) 10/08/2017    Priority: High    Overview:  Humira for RA; frequent pred; no LIVE vaccines   . Obesity with body mass index 30 or greater 01/25/2017    Priority: High  . Current chronic use of systemic steroids 06/08/2016    Priority: High  . Essential hypertension 10/20/2008    Priority: High  . Mixed hyperlipidemia 04/23/2008    Priority: High  . Hypercalcemia 12/11/2018    Priority: Medium    Non parathyroid   . H/O partial resection of colon 01/31/2018    Priority: Medium  . Leukocytosis 08/27/2014    Priority: Medium    Overview:  Reactive from RA but undergoing eval by heme. Dr. Alen Blew.   . Fatty liver disease, nonalcoholic 87/56/4332    Priority: Medium  . Benign colon polyp 10/20/2008    Priority: Medium  . Diverticulosis of colon 10/20/2008    Priority: Medium  . Venous (peripheral) insufficiency 04/23/2008    Priority: Medium  . Gastroesophageal reflux disease without esophagitis 04/23/2008    Priority: Medium  . Bilateral sensorineural hearing loss 02/02/2019    Priority: Low  . Uses hearing aid 12/09/2015    Priority: Low  . Thrombocytopenia ([place]) 11/23/2013    Priority: Low  . Allergic rhinitis 04/23/2008    Priority: Low  . Wound dehiscence 04/21/2020   Health Maintenance  Topic Date Due  . DEXA SCAN  02/11/2021  . MAMMOGRAM  10/03/2021  .  COLONOSCOPY (Pts 45-62yrs Insurance coverage will need to be confirmed)  01/09/2024  . TETANUS/TDAP  12/10/2027  . INFLUENZA VACCINE  Completed  . COVID-19 Vaccine  Completed  . Hepatitis C Screening  Completed  . PNA vac Low Risk Adult  Completed   Immunization History  Administered Date(s) Administered  . Fluad Quad(high Dose 65+) 10/01/2019, 09/22/2020  . Influenza Split 01/09/2012, 08/25/2012, 09/30/2013  . Influenza Whole 12/12/2009  . Influenza, High Dose Seasonal PF 09/03/2016, 09/19/2017, 09/11/2018  . Influenza,inj,quad, With Preservative 09/02/2018, 08/04/2019  . Influenza-Unspecified 08/03/2014, 08/04/2015, 08/03/2016, 09/16/2017  . Moderna Sars-Covid-2 Vaccination 01/01/2020, 01/29/2020, 08/19/2020  . Pneumococcal Conjugate-13 08/27/2014  . Pneumococcal Polysaccharide-23 12/09/2015  . Pneumococcal-Unspecified 08/03/2014, 12/04/2015  . Tdap 12/09/2017   We updated and reviewed the patient's past history in detail and it is  documented below. Allergies: Patient is allergic to remicade [infliximab]. Past Medical History Patient  has a past medical history of Abnormal chest x-ray, Abnormal electrocardiogram, Abscess of sigmoid colon due to diverticulitis (11/04/2017), Allergic rhinitis, Allergy, Cancer ([place]), Cataract, Colon polyp, Complication of anesthesia, Contact dermatitis due to poison ivy, Cystitis, Diverticulitis with abscess status post robotic low anterior rectosigmoid resection 12/11/2017 (12/11/2017), Diverticulosis of colon, Fatty liver disease, nonalcoholic, GERD (gastroesophageal reflux disease), Hearing loss, Hemorrhoids, Hyperlipidemia, Hypertension, Lymphocytosis, Overweight(278.02), Pneumonia, PONV (postoperative nausea and vomiting), RA (rheumatoid arthritis) ([place]), S/P reverse total shoulder arthroplasty, 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 Past Surgical History Patient  has a past surgical history that includes Total abdominal hysterectomy (12/86); Inner  ear surgery (Right); Nasal septum surgery; Foot surgery; chemical and laser endovenous ablation of LE veins (2008, 2009, 2010, 2011); Cesarean section; Proctoscopy (N/A, 12/11/2017); Laparoscopic lysis of adhesions (N/A, 12/11/2017); Tympanoplasty (Right, 11/03/2018); Colonoscopy; Polypectomy; Reverse shoulder arthroplasty (Right, 09/10/2019); Incision and drainage (Bilateral, 04/22/2020); Incision and drainage abscess (Left, 04/24/2020); Dressing change under anesthesia (Right, 04/24/2020); endoscopy; I & D extremity (Left, 06/09/2020); Application of a-cell of extremity (Left, 06/09/2020); Application if wound vac (Left, 06/09/2020); Tonsillectomy; Skin split graft (Left, [id number]); and Application if wound vac (Left, 07/26/2020). Family History: Patient family history includes Arthritis in her mother; Atrial fibrillation in her mother; Breast cancer in an other family member; COPD in her mother; Diabetes in her maternal aunt, maternal uncle, and sister; Healthy in her sister; Heart disease in her mother and sister; Heart failure in her brother and son; Hyperlipidemia in her mother; Hypertension in her mother; Liver disease in her sister. Social History:  Patient  reports that she quit smoking about 19 years ago. Her smoking use included cigarettes. She has a 7.50 pack-year smoking history. She has never used smokeless tobacco. She reports current alcohol use. She reports that she does not use drugs.  Review of Systems: Constitutional: negative for fever or malaise Ophthalmic: negative for photophobia, double vision or loss of vision, poor vision on left due to cataract Cardiovascular: negative for chest pain, dyspnea on exertion, or new LE swelling Respiratory: negative for SOB or persistent cough Gastrointestinal: negative for abdominal pain, change in bowel habits or melena Genitourinary: negative for dysuria or gross hematuria, no abnormal uterine bleeding or disharge Musculoskeletal: negative for new gait  disturbance or muscular weakness Integumentary: negative for new or persistent rashes, no breast lumps Neurological: negative for TIA or stroke symptoms Psychiatric: negative for SI or delusions Allergic/Immunologic: negative for hives  Patient Care Team    Relationship Specialty Notifications Start End  [name], MD PCP - General Family Medicine  09/01/14   Michael Boston, MD Consulting Physician General Surgery  10/08/17   Irene Shipper, MD Consulting Physician Gastroenterology  10/10/17   Wyatt Portela, MD Consulting Physician Hematology and Oncology  10/10/17   Michel Bickers, MD Consulting Physician Infectious Diseases  11/04/17   Gavin Pound, MD Consulting Physician Rheumatology  11/04/17   Justice Britain, MD Consulting Physician Orthopedic Surgery  03/01/20     Objective  Vitals: BP 140/84   Pulse (!) 56   Temp (!) 97.3 F (36.3 C) (Temporal)   Resp 16   Ht 5\' 4"  (1.626 m)   Wt 218 lb 9.6 oz (99.2 kg)   LMP  (LMP Unknown)   SpO2 97%   BMI 37.52 kg/m  General:  Well developed, well nourished, no acute distress  Psych:  Alert and orientedx3,normal mood and affect HEENT:  Normocephalic, atraumatic,  non-icteric sclera,  supple neck without adenopathy, mass or thyromegaly Cardiovascular:  Normal S1, S2, RRR without gallop, rub or murmur Respiratory:  Good breath sounds bilaterally, CTAB with normal respiratory effort Gastrointestinal: normal bowel sounds, soft, non-tender, no noted masses. No HSM MSK: decreased flexion of left knee due to healing wound and stiffness Skin:  Left knee: ecchymosis and redness preent    Commons side effects, risks, benefits, and alternatives for medications and treatment plan prescribed today were discussed, and the patient expressed understanding of the given instructions. Patient is instructed to call or message via MyChart if he/she has any questions or concerns regarding our treatment plan. No barriers to understanding were identified. We  discussed Red Flag symptoms and signs in detail. Patient expressed understanding regarding what to do in case of urgent or emergency type symptoms.   Medication list was reconciled, printed and provided to the patient in AVS. Patient instructions and summary information was reviewed with the patient as documented in the AVS. This note was prepared with assistance of Dragon voice recognition software. Occasional wrong-word or sound-a-like substitutions may have occurred due to the inherent limitations of voice recognition software  This visit occurred during the SARS-CoV-2 public health emergency.  Safety protocols were in place, including screening questions prior to the visit, additional usage of staff PPE, and extensive cleaning of exam room while observing appropriate contact time as indicated for disinfecting solutions.

## 2020-12-28 ENCOUNTER — Ambulatory Visit: Payer: BC Managed Care – PPO | Admitting: Surgical

## 2020-12-28 DIAGNOSIS — H2512 Age-related nuclear cataract, left eye: Secondary | ICD-10-CM | POA: Diagnosis not present

## 2020-12-28 LAB — CBC WITH DIFFERENTIAL/PLATELET
Basophils Absolute: 0.1 10*3/uL (ref 0.0–0.1)
Basophils Relative: 0.4 % (ref 0.0–3.0)
Eosinophils Absolute: 0.1 10*3/uL (ref 0.0–0.7)
Eosinophils Relative: 0.4 % (ref 0.0–5.0)
HCT: 39.6 % (ref 36.0–46.0)
Hemoglobin: 13.3 g/dL (ref 12.0–15.0)
Lymphocytes Relative: 10.1 % — ABNORMAL LOW (ref 12.0–46.0)
Lymphs Abs: 1.6 10*3/uL (ref 0.7–4.0)
MCHC: 33.5 g/dL (ref 30.0–36.0)
MCV: 90.4 fl (ref 78.0–100.0)
Monocytes Absolute: 3.7 10*3/uL — ABNORMAL HIGH (ref 0.1–1.0)
Monocytes Relative: 23.6 % — ABNORMAL HIGH (ref 3.0–12.0)
Neutro Abs: 10.4 10*3/uL — ABNORMAL HIGH (ref 1.4–7.7)
Neutrophils Relative %: 65.5 % (ref 43.0–77.0)
Platelets: 103 10*3/uL — ABNORMAL LOW (ref 150.0–400.0)
RBC: 4.38 Mil/uL (ref 3.87–5.11)
RDW: 15.9 % — ABNORMAL HIGH (ref 11.5–15.5)
WBC: 15.8 10*3/uL — ABNORMAL HIGH (ref 4.0–10.5)

## 2020-12-28 LAB — COMPREHENSIVE METABOLIC PANEL
ALT: 30 U/L (ref 0–35)
AST: 37 U/L (ref 0–37)
Albumin: 4.7 g/dL (ref 3.5–5.2)
Alkaline Phosphatase: 45 U/L (ref 39–117)
BUN: 18 mg/dL (ref 6–23)
CO2: 27 mEq/L (ref 19–32)
Calcium: 10.3 mg/dL (ref 8.4–10.5)
Chloride: 101 mEq/L (ref 96–112)
Creatinine, Ser: 0.88 mg/dL (ref 0.40–1.20)
GFR: 65.98 mL/min (ref >60.00)
Glucose, Bld: 86 mg/dL (ref 70–99)
Potassium: 3.7 mEq/L (ref 3.5–5.1)
Sodium: 139 mEq/L (ref 135–145)
Total Bilirubin: 0.6 mg/dL (ref 0.2–1.2)
Total Protein: 8 g/dL (ref 6.0–8.3)

## 2020-12-28 LAB — LIPID PANEL
Cholesterol: 131 mg/dL (ref 0–200)
HDL: 51.9 mg/dL (ref >39.00)
LDL Cholesterol: 61 mg/dL (ref 0–99)
NonHDL: 79.28
Total CHOL/HDL Ratio: 3
Triglycerides: 89 mg/dL (ref 0.0–149.0)
VLDL: 17.8 mg/dL (ref 0.0–40.0)

## 2020-12-28 LAB — TSH: TSH: 1.87 u[IU]/mL (ref 0.35–4.50)

## 2020-12-28 LAB — PARATHYROID HORMONE, INTACT (NO CA): PTH: 24 pg/mL (ref 14–64)

## 2020-12-29 DIAGNOSIS — H2511 Age-related nuclear cataract, right eye: Secondary | ICD-10-CM | POA: Diagnosis not present

## 2020-12-31 ENCOUNTER — Encounter: Payer: Self-pay | Admitting: Family Medicine

## 2021-01-04 ENCOUNTER — Encounter: Payer: Self-pay | Admitting: Family Medicine

## 2021-01-04 ENCOUNTER — Encounter: Payer: Self-pay | Admitting: Surgical

## 2021-01-04 ENCOUNTER — Ambulatory Visit: Payer: BC Managed Care – PPO | Admitting: Surgical

## 2021-01-04 ENCOUNTER — Other Ambulatory Visit: Payer: Self-pay

## 2021-01-04 VITALS — BP 142/61 | HR 72

## 2021-01-04 DIAGNOSIS — S81002D Unspecified open wound, left knee, subsequent encounter: Secondary | ICD-10-CM

## 2021-01-04 NOTE — Progress Notes (Signed)
Patient is a 70 year old female who underwent a skin graft her left knee wound on 07/27/2019 with Dr. Claudia Desanctis.  Unfortunately her graft did not take well and she has been doing wound care.  Per EMR review her knee wound has been healed since 10/26/2020, however she subsequently developed some skin breakdown on the medial lateral sides of the knee with some bruising that was suspected to be from the knee immobilizer.  She is no longer using the knee immobilizer at this time.  She reports she is still ambulating with use of a walker.  She reports she is very fearful of falling again.   Patient is here with her husband she reports that she is doing really well.  She is interested in starting therapy to improve range of motion of her left knee as she has been very and active during this healing process due to fear of the wound worsening.  She has been applying Vaseline to the wound daily.  On exam left knee wound is stable, appears significantly less irritated than previous visit on 12/12/2020.  There is no surrounding erythema.  There is an improvement in the surrounding skin with less scabbing and dried skin.  She does have a small pinpoint scab on the anterior portion of her knee.  There is no cellulitic changes.  There is no drainage noted.  Recommend Vaseline daily for moisturization, avoid any lotions at this time. Discussed with patient that in regards to starting physical therapy, she has no restrictions from our standpoint. There is no sign of infection. Recommend following up as needed, call with questions or concerns. Pictures were obtained of the patient and placed in the chart with the patient's or guardian's permission.

## 2021-01-11 DIAGNOSIS — H2511 Age-related nuclear cataract, right eye: Secondary | ICD-10-CM | POA: Diagnosis not present

## 2021-01-19 DIAGNOSIS — L282 Other prurigo: Secondary | ICD-10-CM | POA: Diagnosis not present

## 2021-01-19 DIAGNOSIS — L409 Psoriasis, unspecified: Secondary | ICD-10-CM | POA: Diagnosis not present

## 2021-01-19 DIAGNOSIS — L570 Actinic keratosis: Secondary | ICD-10-CM | POA: Diagnosis not present

## 2021-01-19 DIAGNOSIS — D225 Melanocytic nevi of trunk: Secondary | ICD-10-CM | POA: Diagnosis not present

## 2021-01-19 DIAGNOSIS — C44311 Basal cell carcinoma of skin of nose: Secondary | ICD-10-CM | POA: Diagnosis not present

## 2021-01-19 DIAGNOSIS — L578 Other skin changes due to chronic exposure to nonionizing radiation: Secondary | ICD-10-CM | POA: Diagnosis not present

## 2021-01-19 DIAGNOSIS — D485 Neoplasm of uncertain behavior of skin: Secondary | ICD-10-CM | POA: Diagnosis not present

## 2021-01-27 ENCOUNTER — Ambulatory Visit: Payer: BC Managed Care – PPO | Attending: Internal Medicine

## 2021-01-27 DIAGNOSIS — Z23 Encounter for immunization: Secondary | ICD-10-CM

## 2021-01-27 NOTE — Progress Notes (Signed)
   Covid-19 Vaccination Clinic  Name:  Morgan [name]    MRN: [id number] DOB: July 13, 1951  01/27/2021  [name] was observed post Covid-19 immunization for 30 minutes based on pre-vaccination screening without incident. She was provided with Vaccine Information Sheet and instruction to access the V-Safe system.   [name] was instructed to call 911 with any severe reactions post vaccine: Marland Kitchen Difficulty breathing  . Swelling of face and throat  . A fast heartbeat  . A bad rash all over body  . Dizziness and weakness   Immunizations Administered    Name Date Dose VIS Date Route   Moderna Covid-19 Booster Vaccine 01/27/2021  1:43 PM 0.25 mL 09/21/2020 Intramuscular   Manufacturer: Moderna   Lot: [id number]   [place]: [id number]

## 2021-01-28 ENCOUNTER — Other Ambulatory Visit: Payer: Self-pay | Admitting: Family Medicine

## 2021-01-30 DIAGNOSIS — M25562 Pain in left knee: Secondary | ICD-10-CM | POA: Diagnosis not present

## 2021-02-01 DIAGNOSIS — M25562 Pain in left knee: Secondary | ICD-10-CM | POA: Diagnosis not present

## 2021-02-01 DIAGNOSIS — M25662 Stiffness of left knee, not elsewhere classified: Secondary | ICD-10-CM | POA: Diagnosis not present

## 2021-02-01 DIAGNOSIS — R2689 Other abnormalities of gait and mobility: Secondary | ICD-10-CM | POA: Diagnosis not present

## 2021-02-06 DIAGNOSIS — R278 Other lack of coordination: Secondary | ICD-10-CM | POA: Diagnosis not present

## 2021-02-06 DIAGNOSIS — M25662 Stiffness of left knee, not elsewhere classified: Secondary | ICD-10-CM | POA: Diagnosis not present

## 2021-02-06 DIAGNOSIS — M25562 Pain in left knee: Secondary | ICD-10-CM | POA: Diagnosis not present

## 2021-02-08 DIAGNOSIS — M25662 Stiffness of left knee, not elsewhere classified: Secondary | ICD-10-CM | POA: Diagnosis not present

## 2021-02-08 DIAGNOSIS — R278 Other lack of coordination: Secondary | ICD-10-CM | POA: Diagnosis not present

## 2021-02-13 DIAGNOSIS — M25661 Stiffness of right knee, not elsewhere classified: Secondary | ICD-10-CM | POA: Diagnosis not present

## 2021-02-13 DIAGNOSIS — M25561 Pain in right knee: Secondary | ICD-10-CM | POA: Diagnosis not present

## 2021-02-15 DIAGNOSIS — M25662 Stiffness of left knee, not elsewhere classified: Secondary | ICD-10-CM | POA: Diagnosis not present

## 2021-02-15 DIAGNOSIS — M25562 Pain in left knee: Secondary | ICD-10-CM | POA: Diagnosis not present

## 2021-02-20 DIAGNOSIS — M25662 Stiffness of left knee, not elsewhere classified: Secondary | ICD-10-CM | POA: Diagnosis not present

## 2021-02-20 DIAGNOSIS — M25562 Pain in left knee: Secondary | ICD-10-CM | POA: Diagnosis not present

## 2021-02-22 DIAGNOSIS — M25662 Stiffness of left knee, not elsewhere classified: Secondary | ICD-10-CM | POA: Diagnosis not present

## 2021-02-22 DIAGNOSIS — M25562 Pain in left knee: Secondary | ICD-10-CM | POA: Diagnosis not present

## 2021-02-27 DIAGNOSIS — M25662 Stiffness of left knee, not elsewhere classified: Secondary | ICD-10-CM | POA: Diagnosis not present

## 2021-03-01 DIAGNOSIS — M25662 Stiffness of left knee, not elsewhere classified: Secondary | ICD-10-CM | POA: Diagnosis not present

## 2021-03-06 DIAGNOSIS — M25562 Pain in left knee: Secondary | ICD-10-CM | POA: Diagnosis not present

## 2021-03-06 DIAGNOSIS — M25662 Stiffness of left knee, not elsewhere classified: Secondary | ICD-10-CM | POA: Diagnosis not present

## 2021-03-08 DIAGNOSIS — H18453 Nodular corneal degeneration, bilateral: Secondary | ICD-10-CM | POA: Diagnosis not present

## 2021-03-08 DIAGNOSIS — H04123 Dry eye syndrome of bilateral lacrimal glands: Secondary | ICD-10-CM | POA: Diagnosis not present

## 2021-03-08 DIAGNOSIS — H18593 Other hereditary corneal dystrophies, bilateral: Secondary | ICD-10-CM | POA: Diagnosis not present

## 2021-03-08 DIAGNOSIS — H40013 Open angle with borderline findings, low risk, bilateral: Secondary | ICD-10-CM | POA: Diagnosis not present

## 2021-03-10 DIAGNOSIS — M25662 Stiffness of left knee, not elsewhere classified: Secondary | ICD-10-CM | POA: Diagnosis not present

## 2021-03-10 DIAGNOSIS — R278 Other lack of coordination: Secondary | ICD-10-CM | POA: Diagnosis not present

## 2021-03-10 DIAGNOSIS — M25562 Pain in left knee: Secondary | ICD-10-CM | POA: Diagnosis not present

## 2021-03-13 DIAGNOSIS — R278 Other lack of coordination: Secondary | ICD-10-CM | POA: Diagnosis not present

## 2021-03-13 DIAGNOSIS — M25662 Stiffness of left knee, not elsewhere classified: Secondary | ICD-10-CM | POA: Diagnosis not present

## 2021-03-15 DIAGNOSIS — M25662 Stiffness of left knee, not elsewhere classified: Secondary | ICD-10-CM | POA: Diagnosis not present

## 2021-03-15 DIAGNOSIS — R278 Other lack of coordination: Secondary | ICD-10-CM | POA: Diagnosis not present

## 2021-03-15 DIAGNOSIS — M25562 Pain in left knee: Secondary | ICD-10-CM | POA: Diagnosis not present

## 2021-03-20 DIAGNOSIS — M25562 Pain in left knee: Secondary | ICD-10-CM | POA: Diagnosis not present

## 2021-03-20 DIAGNOSIS — M25662 Stiffness of left knee, not elsewhere classified: Secondary | ICD-10-CM | POA: Diagnosis not present

## 2021-03-22 DIAGNOSIS — M25662 Stiffness of left knee, not elsewhere classified: Secondary | ICD-10-CM | POA: Diagnosis not present

## 2021-03-23 DIAGNOSIS — C44311 Basal cell carcinoma of skin of nose: Secondary | ICD-10-CM | POA: Diagnosis not present

## 2021-03-24 DIAGNOSIS — M24662 Ankylosis, left knee: Secondary | ICD-10-CM | POA: Diagnosis not present

## 2021-03-29 DIAGNOSIS — Z79899 Other long term (current) drug therapy: Secondary | ICD-10-CM | POA: Diagnosis not present

## 2021-03-29 DIAGNOSIS — L409 Psoriasis, unspecified: Secondary | ICD-10-CM | POA: Diagnosis not present

## 2021-03-29 DIAGNOSIS — M150 Primary generalized (osteo)arthritis: Secondary | ICD-10-CM | POA: Diagnosis not present

## 2021-03-29 DIAGNOSIS — L4050 Arthropathic psoriasis, unspecified: Secondary | ICD-10-CM | POA: Diagnosis not present

## 2021-03-30 DIAGNOSIS — M25662 Stiffness of left knee, not elsewhere classified: Secondary | ICD-10-CM | POA: Diagnosis not present

## 2021-03-30 DIAGNOSIS — M25562 Pain in left knee: Secondary | ICD-10-CM | POA: Diagnosis not present

## 2021-03-31 ENCOUNTER — Other Ambulatory Visit: Payer: Self-pay | Admitting: Family Medicine

## 2021-03-31 DIAGNOSIS — Z1231 Encounter for screening mammogram for malignant neoplasm of breast: Secondary | ICD-10-CM

## 2021-04-01 ENCOUNTER — Encounter: Payer: Self-pay | Admitting: Family Medicine

## 2021-04-03 ENCOUNTER — Other Ambulatory Visit: Payer: Self-pay

## 2021-04-03 DIAGNOSIS — B9689 Other specified bacterial agents as the cause of diseases classified elsewhere: Secondary | ICD-10-CM

## 2021-04-03 MED ORDER — FLUTICASONE PROPIONATE 50 MCG/ACT NA SUSP: 2.0000 | Freq: Every day | NASAL | 2 refills | Status: DC | PRN

## 2021-04-04 DIAGNOSIS — M25662 Stiffness of left knee, not elsewhere classified: Secondary | ICD-10-CM | POA: Diagnosis not present

## 2021-04-04 DIAGNOSIS — H1131 Conjunctival hemorrhage, right eye: Secondary | ICD-10-CM | POA: Diagnosis not present

## 2021-04-04 DIAGNOSIS — M25561 Pain in right knee: Secondary | ICD-10-CM | POA: Diagnosis not present

## 2021-04-07 DIAGNOSIS — M25662 Stiffness of left knee, not elsewhere classified: Secondary | ICD-10-CM | POA: Diagnosis not present

## 2021-04-07 DIAGNOSIS — M25562 Pain in left knee: Secondary | ICD-10-CM | POA: Diagnosis not present

## 2021-04-10 DIAGNOSIS — M25662 Stiffness of left knee, not elsewhere classified: Secondary | ICD-10-CM | POA: Diagnosis not present

## 2021-04-10 DIAGNOSIS — M25562 Pain in left knee: Secondary | ICD-10-CM | POA: Diagnosis not present

## 2021-04-12 DIAGNOSIS — M25562 Pain in left knee: Secondary | ICD-10-CM | POA: Diagnosis not present

## 2021-04-12 DIAGNOSIS — M25662 Stiffness of left knee, not elsewhere classified: Secondary | ICD-10-CM | POA: Diagnosis not present

## 2021-04-12 DIAGNOSIS — R278 Other lack of coordination: Secondary | ICD-10-CM | POA: Diagnosis not present

## 2021-04-19 DIAGNOSIS — H18593 Other hereditary corneal dystrophies, bilateral: Secondary | ICD-10-CM | POA: Diagnosis not present

## 2021-04-19 DIAGNOSIS — H18453 Nodular corneal degeneration, bilateral: Secondary | ICD-10-CM | POA: Diagnosis not present

## 2021-04-19 DIAGNOSIS — Z961 Presence of intraocular lens: Secondary | ICD-10-CM | POA: Diagnosis not present

## 2021-04-19 DIAGNOSIS — H04123 Dry eye syndrome of bilateral lacrimal glands: Secondary | ICD-10-CM | POA: Diagnosis not present

## 2021-04-25 DIAGNOSIS — M25662 Stiffness of left knee, not elsewhere classified: Secondary | ICD-10-CM | POA: Diagnosis not present

## 2021-04-25 DIAGNOSIS — M25562 Pain in left knee: Secondary | ICD-10-CM | POA: Diagnosis not present

## 2021-04-27 DIAGNOSIS — R278 Other lack of coordination: Secondary | ICD-10-CM | POA: Diagnosis not present

## 2021-04-27 DIAGNOSIS — M25662 Stiffness of left knee, not elsewhere classified: Secondary | ICD-10-CM | POA: Diagnosis not present

## 2021-04-28 ENCOUNTER — Ambulatory Visit (INDEPENDENT_AMBULATORY_CARE_PROVIDER_SITE_OTHER): Payer: BC Managed Care – PPO | Admitting: Family Medicine

## 2021-04-28 ENCOUNTER — Encounter: Payer: Self-pay | Admitting: Family Medicine

## 2021-04-28 ENCOUNTER — Other Ambulatory Visit: Payer: Self-pay

## 2021-04-28 VITALS — BP 124/68 | HR 79 | Temp 96.5°F | Ht 64.0 in | Wt 223.0 lb

## 2021-04-28 DIAGNOSIS — H6983 Other specified disorders of Eustachian tube, bilateral: Secondary | ICD-10-CM | POA: Insufficient documentation

## 2021-04-28 DIAGNOSIS — H66011 Acute suppurative otitis media with spontaneous rupture of ear drum, right ear: Secondary | ICD-10-CM | POA: Insufficient documentation

## 2021-04-28 DIAGNOSIS — J0100 Acute maxillary sinusitis, unspecified: Secondary | ICD-10-CM | POA: Insufficient documentation

## 2021-04-28 MED ORDER — AMOXICILLIN-POT CLAVULANATE 875-125 MG PO TABS: 1.0000 | ORAL_TABLET | Freq: Two times a day (BID) | ORAL | 0 refills | Status: DC

## 2021-04-28 MED ORDER — PREDNISONE 10 MG PO TABS: 10.0000 mg | ORAL_TABLET | Freq: Two times a day (BID) | ORAL | 0 refills | Status: AC

## 2021-04-28 NOTE — Progress Notes (Signed)
Established Patient Office Visit  Subjective:  Patient ID: Morgan [name], female    DOB: 1951-03-14  Age: 70 y.o. MRN: [id number]  CC:  Chief Complaint  Patient presents with  . Ear Pain    Right ear pain some drainage symptoms started yesterday.     HPI Morgan [name] presents for evaluation of acute right ear pain with drainage.  There is been a 2-day history of facial pressure in the periorbital area.  She has had clear rhinorrhea.  Drainage from the ear was purulent.  There has been no fever or chills.  She has ongoing postnasal drip.  She was previously prescribed Flonase a little over a month ago but has used it occasionally.  She is hearing impaired and uses bilateral hearing amplification.  There is been no cough or wheeze.  History of rheumatoid arthritis.  She is on a DMARD.  She takes 5 mg of prednisone daily.  Past Medical History:  Diagnosis Date  . Abnormal chest x-ray   . Abnormal electrocardiogram   . Abscess of sigmoid colon due to diverticulitis 11/04/2017  . Allergic rhinitis   . Allergy   . Cancer (HCC)    Skin -sqameous left hand basal cell face  . Cataract    MD just watching  . Colon polyp    polypoid colorectal mucosa/adenomatous  . Complication of anesthesia    and cries  . Contact dermatitis due to poison ivy   . Cystitis   . Diverticulitis with abscess status post robotic low anterior rectosigmoid resection 12/11/2017 12/11/2017   Diffuse diverticulosis entire colon by colonoscopy 01/2019  . Diverticulosis of colon   . Fatty liver disease, nonalcoholic   . GERD (gastroesophageal reflux disease)    occasional  . Hearing loss    bilateral, wears hearing aids  . Hemorrhoids   . Hyperlipidemia   . Hypertension   . Lymphocytosis   . Overweight(278.02)   . Pneumonia    2017  . PONV (postoperative nausea and vomiting)   . Psoriasis 12/27/2020  . RA (rheumatoid arthritis) (HCC)    Rheumatoid  . S/P reverse total shoulder arthroplasty, right  09/10/2019  . Venous insufficiency     Past Surgical History:  Procedure Laterality Date  . APPLICATION OF A-CELL OF EXTREMITY Left 06/09/2020   Procedure: APPLICATION OF INTEGRA BILAYER OR PRIMATRIX;  Surgeon: [name], MD;  Location: [place];  Service: Plastics;  Laterality: Left;  . APPLICATION OF WOUND VAC Left 06/09/2020   Procedure: APPLICATION OF WOUND VAC;  Surgeon: [name], MD;  Location: [place];  Service: Plastics;  Laterality: Left;  . APPLICATION OF WOUND VAC Left 07/26/2020   Procedure: APPLICATION OF WOUND VAC;  Surgeon: [name], MD;  Location: [place];  Service: Plastics;  Laterality: Left;  APPLICATION OF WOUND VAC  . CESAREAN SECTION     [phone]  . chemical and laser endovenous ablation of LE veins  2008, 2009, 2010, 2011   Dr Eilleen Kempf et al  . COLONOSCOPY     multiple  . DRESSING CHANGE UNDER ANESTHESIA Right 04/24/2020   Procedure: DRESSING CHANGE UNDER ANESTHESIA right knee;  Surgeon: Sydnee Cabal, MD;  Location: [facility];  Service: Orthopedics;  Laterality: Right;  . endoscopy     multiple  . FOOT SURGERY     left  . I & D EXTREMITY Left 06/09/2020   Procedure: Debridement of left knee wound;  Surgeon: [name], MD;  Location: [place];  Service: Clinical cytogeneticist;  Laterality: Left;  total case 45 min  . INCISION AND DRAINAGE Bilateral 04/22/2020   Procedure: INCISION AND DRAINAGE, WOUND VAC PLACEMENT ON LEFT KNEE;  Surgeon: Rod Can, MD;  Location: [facility];  Service: Orthopedics;  Laterality: Bilateral;  . INCISION AND DRAINAGE ABSCESS Left 04/24/2020   Procedure: INCISION AND DRAINAGE LEFT KNEE;  Surgeon: Sydnee Cabal, MD;  Location: [facility];  Service: Orthopedics;  Laterality: Left;  . INNER EAR SURGERY Right    childhood; right, ear drum repair  . LAPAROSCOPIC LYSIS OF ADHESIONS N/A 12/11/2017   Procedure: LAPAROSCOPIC LYSIS OF ADHESIONS;  Surgeon: Michael Boston, MD;  Location: [facility];  Service: General;  Laterality: N/A;  . NASAL SEPTUM  SURGERY    . POLYPECTOMY    . PROCTOSCOPY N/A 12/11/2017   Procedure: RIDGED PROCTOSCOPY;  Surgeon: Michael Boston, MD;  Location: [facility];  Service: General;  Laterality: N/A;  . REVERSE SHOULDER ARTHROPLASTY Right 09/10/2019   Procedure: REVERSE SHOULDER ARTHROPLASTY;  Surgeon: Justice Britain, MD;  Location: [facility];  Service: Orthopedics;  Laterality: Right;  142mn  . SKIN SPLIT GRAFT Left 07/26/2020   Procedure: SKIN GRAFT SPLIT THICKNESS;  Surgeon: PCindra Presume MD;  Location: [place]  Service: Plastics;  Laterality: Left;  SKIN GRAFT SPLIT THICKNESS  . TONSILLECTOMY    . TOTAL ABDOMINAL HYSTERECTOMY  12/86  . TYMPANOPLASTY Right 11/03/2018   Procedure: TYMPANOPLASTY;  Surgeon: BMelida Quitter MD;  [place] [facility]  Service: ENT;  Laterality: Right;    Family History  Problem Relation Age of Onset  . Hyperlipidemia Mother   . Hypertension Mother   . COPD Mother   . Arthritis Mother   . Heart disease Mother   . Atrial fibrillation Mother   . Diabetes Sister   . Healthy Sister   . Heart disease Sister        sister #1  . Breast cancer Other        half-sister  . Liver disease Sister        sister #1 - hx of liver transplant 14+ yrs ago  . Diabetes Maternal Uncle   . Heart failure Brother   . Diabetes Maternal Aunt        x 2  . Heart failure Son   . Colon cancer Neg Hx   . Colon polyps Neg Hx   . Esophageal cancer Neg Hx   . Rectal cancer Neg Hx   . Stomach cancer Neg Hx     Social History   Socioeconomic History  . Marital status: Married    Spouse name: HCamila Li . Number of children: 4  . Years of education: Not on file  . Highest education level: Not on file  Occupational History  . Occupation: LCommunity education officer [employer] Tobacco Use  . Smoking status: Former Smoker    Packs/day: 0.50    Years: 15.00    Pack years: 7.50    Types: Cigarettes    Quit date: 12/03/2001    Years since quitting: 19.4  . Smokeless tobacco:  Never Used  Vaping Use  . Vaping Use: Never used  Substance and Sexual Activity  . Alcohol use: Yes    Comment: occasional  . Drug use: No  . Sexual activity: Not Currently    Birth control/protection: Surgical    Comment: Hysterectomy  Other Topics Concern  . Not on file  Social History Narrative   Married to HManuel Garcia  with children and grandchildren. 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 acetaminophen (TYLENOL) 500 MG tablet Take 1,000 mg by mouth every 6 (six) hours as needed for moderate pain or headache.     Marland Kitchen aspirin EC 81 MG tablet Take 81 mg by mouth every Monday, Wednesday, and Friday. Swallow whole.    . Calcium Carbonate-Vitamin D 600-400 MG-UNIT tablet Take 1 tablet by mouth 2 (two) times daily.     . Cholecalciferol (VITAMIN D) 50 MCG (2000 UT) CAPS Take 2,000 Units by mouth daily.     Marland Kitchen docusate sodium (COLACE) 100 MG capsule Take 100 mg by mouth in the morning and at bedtime.     Marland Kitchen doxepin (SINEQUAN) 10 MG capsule Take 10 mg by mouth 2 (two) times daily.    . fenofibrate 160 MG tablet TAKE 1 TABLET (160 MG TOTAL) BY MOUTH AT BEDTIME. 90 tablet 3  . fexofenadine (ALLEGRA) 180 MG tablet Take 180 mg by mouth daily.    . fluorometholone (FLAREX) 0.1 % ophthalmic suspension Flarex 0.1 % eye drops,suspension  ONE drop into BOTH eyes four times A DAY USE FOR ONE TO TWO WEEKS. Pulsed therapy/ can USE UP TO FOUR times PER year    . fluticasone (FLONASE) 50 MCG/ACT nasal spray Place 2 sprays into both nostrils daily as needed for allergies. 48 mL 2  . furosemide (LASIX) 20 MG tablet Take 1 tablet (20 mg total) by  mouth daily as needed for edema. Take with potassium 90 tablet 3  . hydroxychloroquine (PLAQUENIL) 200 MG tablet Take 2 tablets (400 mg total) by mouth daily.    Marland Kitchen lisinopril (ZESTRIL) 10 MG tablet Take 1 tablet (10 mg total) by mouth daily. 90 tablet 3  . Multiple Vitamin (MULTIVITAMIN) tablet Take 1 tablet by mouth daily.    . polyethylene glycol (MIRALAX) 17 g packet Take 17 g by mouth daily. 14 each 0  . potassium chloride SA (KLOR-CON) 20 MEQ tablet Take 1 tablet (20 mEq total) by mouth daily as needed (leg swelling). Take with furosemide 90 tablet 3  . predniSONE (DELTASONE) 5 MG tablet Take 5 mg by mouth daily.  3  . simvastatin (ZOCOR) 40 MG tablet TAKE 1 TABLET BY MOUTH EVERYDAY AT BEDTIME 90 tablet 3  . Urea 47 % CREA Apply 1 application topically daily as needed (irritation).     . COSENTYX SENSOREADY, 300 MG, 150 MG/ML SOAJ Inject 300 mg as directed every 28 (twenty-eight) days.     . Hypromellose (ARTIFICIAL TEARS OP) Place 1 drop into both eyes daily as needed (for dry eyes).    . ondansetron (ZOFRAN) 4 MG tablet TAKE 1 TABLET BY MOUTH EVERY 6 HOURS AS NEEDED FOR NAUSEA (Patient not taking: Reported on 04/28/2021) 20 tablet 1  . ketoconazole (NIZORAL) 2 % cream Apply 1 application topically daily as needed for irritation.  (Patient not taking: Reported on 04/28/2021)    . methocarbamol (ROBAXIN) 500 MG tablet Take 1 tablet (500 mg total) by mouth 4 (four) times daily. (Patient not taking: Reported on 04/28/2021) 40 tablet 0  . naproxen sodium (  ALEVE) 220 MG tablet Take 220 mg by mouth daily as needed (pain).      No facility-administered medications prior to visit.    Allergies  Allergen Reactions  . Remicade [Infliximab] Anaphylaxis and Swelling    Facial swelling    ROS Review of Systems  Constitutional: Negative for chills, diaphoresis, fatigue, fever and unexpected weight change.  HENT: Positive for congestion, ear discharge, ear pain, hearing loss, postnasal drip,  rhinorrhea, sinus pressure and sinus pain.   Eyes: Negative for photophobia and visual disturbance.  Respiratory: Negative for cough, chest tightness and wheezing.   Gastrointestinal: Negative.   Neurological: Negative for headaches.      Objective:    Physical Exam Constitutional:      General: She is not in acute distress.    Appearance: Normal appearance. She is not ill-appearing, toxic-appearing or diaphoretic.  HENT:     Head: Normocephalic and atraumatic.     Right Ear: Tympanic membrane is injected, perforated, erythematous and retracted.     Left Ear: Tympanic membrane is retracted. Tympanic membrane is not injected, scarred, perforated or erythematous.     Mouth/Throat:     Mouth: Mucous membranes are moist.     Pharynx: Oropharynx is clear. No oropharyngeal exudate or posterior oropharyngeal erythema.  Eyes:     General:        Right eye: No discharge.        Left eye: No discharge.     Extraocular Movements: Extraocular movements intact.     Conjunctiva/sclera: Conjunctivae normal.     Pupils: Pupils are equal, round, and reactive to light.  Neck:     Vascular: No carotid bruit.  Cardiovascular:     Rate and Rhythm: Normal rate and regular rhythm.  Pulmonary:     Effort: Pulmonary effort is normal.     Breath sounds: Normal breath sounds. No wheezing or rales.  Musculoskeletal:     Cervical back: No rigidity or tenderness.  Lymphadenopathy:     Cervical: No cervical adenopathy.  Skin:    General: Skin is warm and dry.  Neurological:     Mental Status: She is alert and oriented to person, place, and time.  Psychiatric:        Mood and Affect: Mood normal.        Behavior: Behavior normal.     BP 124/68   Pulse 79   Temp (!) 96.5 F (35.8 C) (Temporal)   Ht _0  (1.626 m)   Wt 223 lb (101.2 kg)   LMP  (LMP Unknown)   SpO2 99%   BMI 38.28 kg/m  Wt Readings from Last 3 Encounters:  04/28/21 223 lb (101.2 kg)  12/27/20 218 lb 9.6 oz (99.2 kg)   07/26/20 218 lb (98.9 kg)     Health Maintenance Due  Topic Date Due  . Zoster Vaccines- Shingrix (1 of 2) Never done  . DEXA SCAN  02/11/2021    There are no preventive care reminders to display for this patient.  Lab Results  Component Value Date   TSH 1.87 12/27/2020   Lab Results  Component Value Date   WBC 15.8 (H) 12/27/2020   HGB 13.3 12/27/2020   HCT 39.6 12/27/2020   MCV 90.4 12/27/2020   PLT 103.0 (L) 12/27/2020   Lab Results  Component Value Date   NA 139 12/27/2020   K 3.7 12/27/2020   CHLORIDE 101 06/28/2017   CO2 27 12/27/2020   GLUCOSE 86 12/27/2020   BUN  18 12/27/2020   CREATININE 0.88 12/27/2020   BILITOT 0.6 12/27/2020   ALKPHOS 45 12/27/2020   AST 37 12/27/2020   ALT 30 12/27/2020   PROT 8.0 12/27/2020   ALBUMIN 4.7 12/27/2020   CALCIUM 10.3 12/27/2020   ANIONGAP 12 07/26/2020   EGFR 61 (L) 06/28/2017   GFR 65.98 12/27/2020   Lab Results  Component Value Date   CHOL 131 12/27/2020   Lab Results  Component Value Date   HDL 51.90 12/27/2020   Lab Results  Component Value Date   LDLCALC 61 12/27/2020   Lab Results  Component Value Date   TRIG 89.0 12/27/2020   Lab Results  Component Value Date   CHOLHDL 3 12/27/2020   Lab Results  Component Value Date   HGBA1C 5.4 12/11/2017      Assessment & Plan:   Problem List Items Addressed This Visit      Nervous and Auditory   Dysfunction of both eustachian tubes   Relevant Medications   predniSONE (DELTASONE) 10 MG tablet   Non-recurrent acute suppurative otitis media of right ear with spontaneous rupture of tympanic membrane - Primary   Relevant Medications   amoxicillin-clavulanate (AUGMENTIN) 875-125 MG tablet      Meds ordered this encounter  Medications  . amoxicillin-clavulanate (AUGMENTIN) 875-125 MG tablet    Sig: Take 1 tablet by mouth 2 (two) times daily.    Dispense:  20 tablet    Refill:  0  . predniSONE (DELTASONE) 10 MG tablet    Sig: Take 1 tablet (10  mg total) by mouth 2 (two) times daily with a meal for 5 days.    Dispense:  10 tablet    Refill:  0    Follow-up: Return use flonase daily. Follow up with Dr. Jonni Sanger in a few weeks.Libby Maw, MD

## 2021-05-03 DIAGNOSIS — R278 Other lack of coordination: Secondary | ICD-10-CM | POA: Diagnosis not present

## 2021-05-03 DIAGNOSIS — M25662 Stiffness of left knee, not elsewhere classified: Secondary | ICD-10-CM | POA: Diagnosis not present

## 2021-05-11 DIAGNOSIS — H18599 Other hereditary corneal dystrophies, unspecified eye: Secondary | ICD-10-CM | POA: Diagnosis not present

## 2021-05-16 ENCOUNTER — Encounter: Payer: Self-pay | Admitting: Registered Nurse

## 2021-05-16 ENCOUNTER — Ambulatory Visit (INDEPENDENT_AMBULATORY_CARE_PROVIDER_SITE_OTHER): Payer: BC Managed Care – PPO | Admitting: Registered Nurse

## 2021-05-16 ENCOUNTER — Other Ambulatory Visit: Payer: Self-pay

## 2021-05-16 VITALS — BP 135/62 | HR 70 | Temp 98.0°F | Resp 17 | Ht 64.0 in | Wt 221.4 lb

## 2021-05-16 DIAGNOSIS — H66011 Acute suppurative otitis media with spontaneous rupture of ear drum, right ear: Secondary | ICD-10-CM | POA: Diagnosis not present

## 2021-05-16 MED ORDER — DOXYCYCLINE HYCLATE 100 MG PO TABS: 100.0000 mg | ORAL_TABLET | Freq: Two times a day (BID) | ORAL | 0 refills | Status: DC

## 2021-05-16 NOTE — Patient Instructions (Addendum)
Ms Ricklefs -   Sorry that you're not feeling well.   Doxycycline 100mg  by mouth twice daily should help  Let me know if things get worse or don't get better. Next step would be getting you in to see an [place] specialist - fortunately these referrals are usually fairly quick  If there's no improvement by Friday morning please give me a call  Thank you  Rich     If you have lab work done today you will be contacted with your lab results within the next 2 weeks.  If you have not heard from [place] then please contact us. The fastest way to get your results is to register for My Chart.   IF you received an x-ray today, you will receive an invoice from [facility]. Please contact [facility] at [phone] with questions or concerns regarding your invoice.   IF you received labwork today, you will receive an invoice from [facility]. Please contact [facility] at [phone] with questions or concerns regarding your invoice.   Our billing staff will not be able to assist you with questions regarding bills from these companies.  You will be contacted with the lab results as soon as they are available. The fastest way to get your results is to activate your My Chart account. Instructions are located on the last page of this paperwork. If you have not heard from [place] regarding the results in 2 weeks, please contact this office.

## 2021-05-22 DIAGNOSIS — H9211 Otorrhea, right ear: Secondary | ICD-10-CM | POA: Diagnosis not present

## 2021-05-22 DIAGNOSIS — H903 Sensorineural hearing loss, bilateral: Secondary | ICD-10-CM | POA: Diagnosis not present

## 2021-05-22 DIAGNOSIS — H7201 Central perforation of tympanic membrane, right ear: Secondary | ICD-10-CM | POA: Diagnosis not present

## 2021-05-26 DIAGNOSIS — M25562 Pain in left knee: Secondary | ICD-10-CM | POA: Diagnosis not present

## 2021-05-30 DIAGNOSIS — R278 Other lack of coordination: Secondary | ICD-10-CM | POA: Diagnosis not present

## 2021-05-30 DIAGNOSIS — M25662 Stiffness of left knee, not elsewhere classified: Secondary | ICD-10-CM | POA: Diagnosis not present

## 2021-06-09 DIAGNOSIS — H93293 Other abnormal auditory perceptions, bilateral: Secondary | ICD-10-CM | POA: Diagnosis not present

## 2021-06-09 DIAGNOSIS — H7291 Unspecified perforation of tympanic membrane, right ear: Secondary | ICD-10-CM | POA: Diagnosis not present

## 2021-06-09 DIAGNOSIS — H938X1 Other specified disorders of right ear: Secondary | ICD-10-CM | POA: Diagnosis not present

## 2021-06-09 DIAGNOSIS — Z8669 Personal history of other diseases of the nervous system and sense organs: Secondary | ICD-10-CM | POA: Diagnosis not present

## 2021-06-19 DIAGNOSIS — M25662 Stiffness of left knee, not elsewhere classified: Secondary | ICD-10-CM | POA: Diagnosis not present

## 2021-06-19 DIAGNOSIS — R278 Other lack of coordination: Secondary | ICD-10-CM | POA: Diagnosis not present

## 2021-06-20 DIAGNOSIS — Z961 Presence of intraocular lens: Secondary | ICD-10-CM | POA: Diagnosis not present

## 2021-06-26 DIAGNOSIS — R278 Other lack of coordination: Secondary | ICD-10-CM | POA: Diagnosis not present

## 2021-06-26 DIAGNOSIS — M25662 Stiffness of left knee, not elsewhere classified: Secondary | ICD-10-CM | POA: Diagnosis not present

## 2021-06-28 DIAGNOSIS — M25662 Stiffness of left knee, not elsewhere classified: Secondary | ICD-10-CM | POA: Diagnosis not present

## 2021-06-28 DIAGNOSIS — R278 Other lack of coordination: Secondary | ICD-10-CM | POA: Diagnosis not present

## 2021-07-03 DIAGNOSIS — M25662 Stiffness of left knee, not elsewhere classified: Secondary | ICD-10-CM | POA: Diagnosis not present

## 2021-07-03 DIAGNOSIS — R278 Other lack of coordination: Secondary | ICD-10-CM | POA: Diagnosis not present

## 2021-07-05 DIAGNOSIS — M25662 Stiffness of left knee, not elsewhere classified: Secondary | ICD-10-CM | POA: Diagnosis not present

## 2021-07-05 DIAGNOSIS — R278 Other lack of coordination: Secondary | ICD-10-CM | POA: Diagnosis not present

## 2021-07-10 DIAGNOSIS — M25662 Stiffness of left knee, not elsewhere classified: Secondary | ICD-10-CM | POA: Diagnosis not present

## 2021-07-10 DIAGNOSIS — R278 Other lack of coordination: Secondary | ICD-10-CM | POA: Diagnosis not present

## 2021-07-11 DIAGNOSIS — H18593 Other hereditary corneal dystrophies, bilateral: Secondary | ICD-10-CM | POA: Diagnosis not present

## 2021-07-12 DIAGNOSIS — R278 Other lack of coordination: Secondary | ICD-10-CM | POA: Diagnosis not present

## 2021-07-12 DIAGNOSIS — M25662 Stiffness of left knee, not elsewhere classified: Secondary | ICD-10-CM | POA: Diagnosis not present

## 2021-07-14 DIAGNOSIS — H18593 Other hereditary corneal dystrophies, bilateral: Secondary | ICD-10-CM | POA: Diagnosis not present

## 2021-07-14 DIAGNOSIS — H16072 Perforated corneal ulcer, left eye: Secondary | ICD-10-CM | POA: Diagnosis not present

## 2021-07-19 DIAGNOSIS — H16012 Central corneal ulcer, left eye: Secondary | ICD-10-CM | POA: Diagnosis not present

## 2021-07-26 DIAGNOSIS — H16232 Neurotrophic keratoconjunctivitis, left eye: Secondary | ICD-10-CM | POA: Diagnosis not present

## 2021-08-02 DIAGNOSIS — H16232 Neurotrophic keratoconjunctivitis, left eye: Secondary | ICD-10-CM | POA: Diagnosis not present

## 2021-08-12 ENCOUNTER — Other Ambulatory Visit: Payer: Self-pay | Admitting: Family Medicine

## 2021-08-16 DIAGNOSIS — H90A31 Mixed conductive and sensorineural hearing loss, unilateral, right ear with restricted hearing on the contralateral side: Secondary | ICD-10-CM | POA: Diagnosis not present

## 2021-08-16 DIAGNOSIS — H90A22 Sensorineural hearing loss, unilateral, left ear, with restricted hearing on the contralateral side: Secondary | ICD-10-CM | POA: Diagnosis not present

## 2021-09-13 ENCOUNTER — Other Ambulatory Visit (HOSPITAL_BASED_OUTPATIENT_CLINIC_OR_DEPARTMENT_OTHER): Payer: Self-pay

## 2021-09-13 MED ORDER — INFLUENZA VAC A&B SA ADJ QUAD 0.5 ML IM PRSY
PREFILLED_SYRINGE | INTRAMUSCULAR | 0 refills | Status: DC
  Filled 2021-09-13: qty 0.5, 1d supply, fill #0

## 2021-09-21 NOTE — Progress Notes (Signed)
Established Patient Office Visit  Subjective:  Patient ID: Morgan [name], female    DOB: 1951-01-15  Age: 70 y.o. MRN: [id number]  CC:  Chief Complaint  Patient presents with  . Otitis Media    Patient states she has a double ear infection. Patient states she went to see another provider she had double ear infection started taking abx and it was getting better but starting back up again.    HPI TOI STELLY presents for AOM  Recurrent Recent abx improved symptoms but recurrence happened quickly Hx of TM rupture with infection No current drainage Hearing somewhat muffled No other upper respiratory or neuro symptoms.  Past Medical History:  Diagnosis Date  . Abnormal chest x-ray   . Abnormal electrocardiogram   . Abscess of sigmoid colon due to diverticulitis 11/04/2017  . Allergic rhinitis   . Allergy   . Cancer (HCC)    Skin -sqameous left hand basal cell face  . Cataract    MD just watching  . Colon polyp    polypoid colorectal mucosa/adenomatous  . Complication of anesthesia    and cries  . Contact dermatitis due to poison ivy   . Cystitis   . Diverticulitis with abscess status post robotic low anterior rectosigmoid resection 12/11/2017 12/11/2017   Diffuse diverticulosis entire colon by colonoscopy 01/2019  . Diverticulosis of colon   . Fatty liver disease, nonalcoholic   . GERD (gastroesophageal reflux disease)    occasional  . Hearing loss    bilateral, wears hearing aids  . Hemorrhoids   . Hyperlipidemia   . Hypertension   . Lymphocytosis   . Overweight(278.02)   . Pneumonia    2017  . PONV (postoperative nausea and vomiting)   . Psoriasis 12/27/2020  . RA (rheumatoid arthritis) (HCC)    Rheumatoid  . 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 Venous insufficiency     Past Surgical History:  Procedure Laterality Date  . APPLICATION OF A-CELL OF EXTREMITY Left 06/09/2020   Procedure: APPLICATION OF INTEGRA BILAYER OR PRIMATRIX;   Surgeon: [name], MD;  Location: [facility];  Service: Plastics;  Laterality: Left;  . APPLICATION OF WOUND VAC Left 06/09/2020   Procedure: APPLICATION OF WOUND VAC;  Surgeon: [name], MD;  Location: [facility];  Service: Plastics;  Laterality: Left;  . APPLICATION OF WOUND VAC Left 07/26/2020   Procedure: APPLICATION OF WOUND VAC;  Surgeon: [name], MD;  Location: [facility];  Service: Plastics;  Laterality: Left;  APPLICATION OF WOUND VAC  . CESAREAN SECTION     [phone]  . chemical and laser endovenous ablation of LE veins  2008, 2009, 2010, 2011   Dr Eilleen Kempf et al  . COLONOSCOPY     multiple  . DRESSING CHANGE UNDER ANESTHESIA Right 04/24/2020   Procedure: DRESSING CHANGE UNDER ANESTHESIA right knee;  Surgeon: Sydnee Cabal, MD;  Location: [facility];  Service: Orthopedics;  Laterality: Right;  . endoscopy     multiple  . FOOT SURGERY     left  . I & D EXTREMITY Left 06/09/2020   Procedure: Debridement of left knee wound;  Surgeon: [name], MD;  Location: [facility];  Service: Plastics;  Laterality: Left;  total case 45 min  . INCISION AND DRAINAGE Bilateral 04/22/2020   Procedure: INCISION AND DRAINAGE, WOUND VAC PLACEMENT ON LEFT KNEE;  Surgeon: Rod Can, MD;  Location: [facility];  Service: Orthopedics;  Laterality: Bilateral;  . INCISION  AND DRAINAGE ABSCESS Left 04/24/2020   Procedure: INCISION AND DRAINAGE LEFT KNEE;  Surgeon: Sydnee Cabal, MD;  Location: [facility];  Service: Orthopedics;  Laterality: Left;  . INNER EAR SURGERY Right    childhood; right, ear drum repair  . LAPAROSCOPIC LYSIS OF ADHESIONS N/A 12/11/2017   Procedure: LAPAROSCOPIC LYSIS OF ADHESIONS;  Surgeon: Michael Boston, MD;  Location: [facility];  Service: General;  Laterality: N/A;  . NASAL SEPTUM SURGERY    . POLYPECTOMY    . PROCTOSCOPY N/A 12/11/2017   Procedure: RIDGED PROCTOSCOPY;  Surgeon: Michael Boston, MD;  Location: [facility];  Service: General;  Laterality: N/A;  . REVERSE SHOULDER ARTHROPLASTY  Right 09/10/2019   Procedure: REVERSE SHOULDER ARTHROPLASTY;  Surgeon: Justice Britain, MD;  Location: [facility];  Service: Orthopedics;  Laterality: Right;  151mn  . SKIN SPLIT GRAFT Left 07/26/2020   Procedure: SKIN GRAFT SPLIT THICKNESS;  Surgeon: PCindra Presume MD;  [place] [facility]  Service: Plastics;  Laterality: Left;  SKIN GRAFT SPLIT THICKNESS  . TONSILLECTOMY    . TOTAL ABDOMINAL HYSTERECTOMY  12/86  . TYMPANOPLASTY Right 11/03/2018   Procedure: TYMPANOPLASTY;  Surgeon: BMelida Quitter MD;  [place] [facility]  Service: ENT;  Laterality: Right;    Family History  Problem Relation Age of Onset  . Hyperlipidemia Mother   . Hypertension Mother   . COPD Mother   . Arthritis Mother   . Heart disease Mother   . Atrial fibrillation Mother   . Diabetes Sister   . Healthy Sister   . Heart disease Sister        sister #1  . Breast cancer Other        half-sister  . Liver disease Sister        sister #1 - hx of liver transplant 14+ yrs ago  . Diabetes Maternal Uncle   . Heart failure Brother   . Diabetes Maternal Aunt        x 2  . Heart failure Son   . Colon cancer Neg Hx   . Colon polyps Neg Hx   . Esophageal cancer Neg Hx   . Rectal cancer Neg Hx   . Stomach cancer Neg Hx     Social History   Socioeconomic History  . Marital status: Married    Spouse name: HCamila Li . Number of children: 4  . Years of education: Not on file  . Highest education level: Not on file  Occupational History  . 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 Smoking status: Former    Packs/day: 0.50    Years: 15.00    Pack years: 7.50    Types: Cigarettes    Quit date: 12/03/2001    Years since quitting: 19.8  . Smokeless tobacco: Never  Vaping Use  . Vaping Use: Never used  Substance and Sexual Activity  . Alcohol use: Yes    Comment: occasional  . Drug use: No  . Sexual activity: Not Currently    Birth control/protection: Surgical     Comment: Hysterectomy  Other Topics Concern  . Not on file  Social History Narrative   Mar[place] to [place] with children and grandchildren. 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 acetaminophen (TYLENOL) 500 MG tablet Take 1,000 mg by mouth every 6 (six) hours as needed for moderate pain or headache.     Marland Kitchen aspirin EC 81 MG tablet Take 81 mg by mouth every Monday, Wednesday, and Friday. Swallow whole.    . Calcium Carbonate-Vitamin D 600-400 MG-UNIT tablet Take 1 tablet by mouth 2 (two) times daily.     . Cholecalciferol (VITAMIN D) 50 MCG (2000 UT) CAPS Take 2,000 Units by mouth daily.     . COSENTYX SENSOREADY, 300 MG, 150 MG/ML SOAJ Inject 300 mg as directed every 28 (twenty-eight) days.    Marland Kitchen doxepin (SINEQUAN) 10 MG capsule Take 10 mg by mouth 2 (two) times daily.    . fenofibrate 160 MG tablet TAKE 1 TABLET (160 MG TOTAL) BY MOUTH AT BEDTIME. 90 tablet 3  . fexofenadine (ALLEGRA) 180 MG tablet Take 180 mg by mouth daily.    . fluorometholone (FLAREX) 0.1 % ophthalmic suspension Flarex 0.1 % eye drops,suspension  ONE drop into BOTH eyes four times A DAY USE FOR ONE TO TWO WEEKS. Pulsed therapy/ can USE UP TO FOUR times PER year    . fluticasone (FLONASE) 50 MCG/ACT nasal spray Place 2 sprays into both nostrils daily as needed for allergies. 48 mL 2  . furosemide (LASIX) 20 MG tablet Take 1 tablet (20 mg total) by mouth daily as needed for edema. Take with potassium 90 tablet 3  . hydroxychloroquine (PLAQUENIL) 200 MG tablet Take 2 tablets (400 mg total) by mouth daily.    Marland Kitchen lisinopril (ZESTRIL) 10 MG tablet Take 1 tablet (10 mg  total) by mouth daily. 90 tablet 3  . Multiple Vitamin (MULTIVITAMIN) tablet Take 1 tablet by mouth daily.    . polyethylene glycol (MIRALAX) 17 g packet Take 17 g by mouth daily. 14 each 0  . potassium chloride SA (KLOR-CON) 20 MEQ tablet Take 1 tablet (20 mEq total) by mouth daily as needed (leg swelling). Take with furosemide 90 tablet 3  . predniSONE (DELTASONE) 5 MG tablet Take 5 mg by mouth daily.  3  . Urea 47 % CREA Apply 1 application topically daily as needed (irritation).     . simvastatin (ZOCOR) 40 MG tablet TAKE 1 TABLET BY MOUTH EVERYDAY AT BEDTIME 90 tablet 3  . amoxicillin-clavulanate (AUGMENTIN) 875-125 MG tablet Take 1 tablet by mouth 2 (two) times daily. (Patient not taking: Reported on 05/16/2021) 20 tablet 0  . docusate sodium (COLACE) 100 MG capsule Take 100 mg by mouth in the [facility] and at bedtime.  (Patient not taking: Reported on 05/16/2021)    . Hypromellose (ARTIFICIAL TEARS OP) Place 1 drop into both eyes daily as needed (for dry eyes).    . ondansetron (ZOFRAN) 4 MG tablet TAKE 1 TABLET BY MOUTH EVERY 6 HOURS AS NEEDED FOR NAUSEA (Patient not taking: No sig reported) 20 tablet 1   No facility-administered medications prior to visit.    Allergies  Allergen Reactions  . Remicade [Infliximab] Anaphylaxis and Swelling    Facial swelling    ROS Review of Systems  Constitutional: Negative.   HENT:  Positive for ear pain and hearing loss.   Eyes: Negative.   Respiratory: Negative.    Cardiovascular: Negative.   Gastrointestinal: Negative.   Genitourinary: Negative.   Musculoskeletal: Negative.   Skin:  Negative.   Neurological: Negative.   Psychiatric/Behavioral: Negative.    All other systems reviewed and are negative.    Objective:    Physical Exam Vitals and nursing note reviewed.  Constitutional:      General: She is not in acute distress.    Appearance: Normal appearance. She is normal weight. She is not ill-appearing, toxic-appearing or  diaphoretic.  HENT:     Right Ear: External ear normal. Decreased hearing noted. Swelling and tenderness present. Tympanic membrane is perforated.     Left Ear: External ear normal. Decreased hearing noted. Swelling and tenderness present. Tympanic membrane is perforated.  Cardiovascular:     Rate and Rhythm: Normal rate and regular rhythm.     Heart sounds: Normal heart sounds. No murmur heard.   No friction rub. No gallop.  Pulmonary:     Effort: Pulmonary effort is normal. No respiratory distress.     Breath sounds: Normal breath sounds. No stridor. No wheezing, rhonchi or rales.  Chest:     Chest wall: No tenderness.  Skin:    General: Skin is warm and dry.  Neurological:     General: No focal deficit present.     Mental Status: She is alert and oriented to person, place, and time. Mental status is at baseline.  Psychiatric:        Mood and Affect: Mood normal.        Behavior: Behavior normal.        Thought Content: Thought content normal.        Judgment: Judgment normal.    BP 135/62   Pulse 70   Temp 98 F (36.7 C) (Temporal)   Resp 17   Ht _0  (1.626 m)   Wt 221 lb 6.4 oz (100.4 kg)   LMP  (LMP Unknown)   BMI 38.00 kg/m  Wt Readings from Last 3 Encounters:  05/16/21 221 lb 6.4 oz (100.4 kg)  04/28/21 223 lb (101.2 kg)  12/27/20 218 lb 9.6 oz (99.2 kg)     Health Maintenance Due  Topic Date Due  . Zoster Vaccines- Shingrix (1 of 2) Never done  . DEXA SCAN  02/11/2021  . COVID-19 Vaccine (5 - Booster for Moderna series) 03/24/2021  . INFLUENZA VACCINE  07/03/2021    There are no preventive care reminders to display for this patient.  Lab Results  Component Value Date   TSH 1.87 12/27/2020   Lab Results  Component Value Date   WBC 15.8 (H) 12/27/2020   HGB 13.3 12/27/2020   HCT 39.6 12/27/2020   MCV 90.4 12/27/2020   PLT 103.0 (L) 12/27/2020   Lab Results  Component Value Date   NA 139 12/27/2020   K 3.7 12/27/2020   CHLORIDE 101 06/28/2017    CO2 27 12/27/2020   GLUCOSE 86 12/27/2020   BUN 18 12/27/2020   CREATININE 0.88 12/27/2020   BILITOT 0.6 12/27/2020   ALKPHOS 45 12/27/2020   AST 37 12/27/2020   ALT 30 12/27/2020   PROT 8.0 12/27/2020   ALBUMIN 4.7 12/27/2020   CALCIUM 10.3 12/27/2020   ANIONGAP 12 07/26/2020   EGFR 61 (L) 06/28/2017   GFR 65.98 12/27/2020   Lab Results  Component Value Date   CHOL 131 12/27/2020   Lab Results  Component Value Date   HDL 51.90 12/27/2020   Lab Results  Component Value Date   LDLCALC 61 12/27/2020   Lab Results  Component Value Date   TRIG 89.0 12/27/2020  Lab Results  Component Value Date   CHOLHDL 3 12/27/2020   Lab Results  Component Value Date   HGBA1C 5.4 12/11/2017      Assessment & Plan:   Problem List Items Addressed This Visit   None Visit Diagnoses     Acute suppurative otitis media of right ear with spontaneous rupture of tympanic membrane, recurrence not specified    -  Primary   Relevant Medications   doxycycline (VIBRA-TABS) 100 MG tablet       Meds ordered this encounter  Medications  . doxycycline (VIBRA-TABS) 100 MG tablet    Sig: Take 1 tablet (100 mg total) by mouth 2 (two) times daily.    Dispense:  14 tablet    Refill:  0    Order Specific Question:   Supervising Provider    Answer:   Carlota Raspberry, JEFFREY R [[id number]]    Follow-up: Return if symptoms worsen or fail to improve.   PLAN Doxycycline as above. Can consider ENT referral if persistent or worsening Discussed supportive care with patient who voices understanding Patient encouraged to call clinic with any questions, comments, or concerns.  Maximiano Coss, NP

## 2021-09-28 DIAGNOSIS — R5383 Other fatigue: Secondary | ICD-10-CM | POA: Diagnosis not present

## 2021-09-28 DIAGNOSIS — L4050 Arthropathic psoriasis, unspecified: Secondary | ICD-10-CM | POA: Diagnosis not present

## 2021-09-28 DIAGNOSIS — L409 Psoriasis, unspecified: Secondary | ICD-10-CM | POA: Diagnosis not present

## 2021-09-28 DIAGNOSIS — M2550 Pain in unspecified joint: Secondary | ICD-10-CM | POA: Diagnosis not present

## 2021-09-28 DIAGNOSIS — M150 Primary generalized (osteo)arthritis: Secondary | ICD-10-CM | POA: Diagnosis not present

## 2021-10-04 ENCOUNTER — Ambulatory Visit
Admission: RE | Admit: 2021-10-04 | Discharge: 2021-10-04 | Disposition: A | Discharge status: Home / Self Care | Payer: BC Managed Care – PPO | Source: Ambulatory Visit | Attending: Family Medicine | Admitting: Family Medicine

## 2021-10-04 ENCOUNTER — Other Ambulatory Visit: Payer: Self-pay

## 2021-10-04 DIAGNOSIS — Z7952 Long term (current) use of systemic steroids: Secondary | ICD-10-CM

## 2021-10-04 DIAGNOSIS — Z1231 Encounter for screening mammogram for malignant neoplasm of breast: Secondary | ICD-10-CM

## 2021-10-04 DIAGNOSIS — Z9189 Other specified personal risk factors, not elsewhere classified: Secondary | ICD-10-CM

## 2021-10-04 DIAGNOSIS — Z780 Asymptomatic menopausal state: Secondary | ICD-10-CM

## 2021-10-05 ENCOUNTER — Other Ambulatory Visit (HOSPITAL_BASED_OUTPATIENT_CLINIC_OR_DEPARTMENT_OTHER): Payer: Self-pay

## 2021-10-05 ENCOUNTER — Ambulatory Visit: Payer: BC Managed Care – PPO | Attending: Internal Medicine

## 2021-10-05 DIAGNOSIS — Z23 Encounter for immunization: Secondary | ICD-10-CM

## 2021-10-05 MED ORDER — PFIZER COVID-19 VAC BIVALENT 30 MCG/0.3ML IM SUSP
INTRAMUSCULAR | 0 refills | Status: DC
  Filled 2021-10-05: qty 0.3, 1d supply, fill #0

## 2021-10-05 NOTE — Progress Notes (Signed)
   Covid-19 Vaccination Clinic  Name:  Morgan [name]    MRN: [id number] DOB: [birth year]  10/05/2021  Morgan [name] was observed post Covid-19 immunization for 15 minutes without incident. She was provided with Vaccine Information Sheet and instruction to access the V-Safe system.   Morgan [name] was instructed to call 911 with any severe reactions post vaccine: Difficulty breathing  Swelling of face and throat  A fast heartbeat  A bad rash all over body  Dizziness and weakness   Immunizations Administered     Name Date Dose VIS Date Route   Moderna Covid-19 vaccine Bivalent Booster 10/05/2021  2:05 PM 0.5 mL 07/15/2021 Intramuscular   Manufacturer: Moderna   Lot: [id number]   [place]: [id number]

## 2021-10-09 ENCOUNTER — Encounter: Payer: Self-pay | Admitting: Family Medicine

## 2021-10-09 DIAGNOSIS — Z13820 Encounter for screening for osteoporosis: Secondary | ICD-10-CM | POA: Insufficient documentation

## 2021-11-03 DIAGNOSIS — H16232 Neurotrophic keratoconjunctivitis, left eye: Secondary | ICD-10-CM | POA: Diagnosis not present

## 2021-12-11 ENCOUNTER — Other Ambulatory Visit: Payer: Self-pay | Admitting: Family Medicine

## 2021-12-28 ENCOUNTER — Other Ambulatory Visit: Payer: Self-pay

## 2021-12-28 ENCOUNTER — Ambulatory Visit: Payer: BC Managed Care – PPO | Admitting: Family

## 2021-12-28 ENCOUNTER — Encounter: Payer: Self-pay | Admitting: Family

## 2021-12-28 VITALS — BP 128/73 | HR 84 | Temp 97.6°F | Ht 64.0 in | Wt 206.2 lb

## 2021-12-28 DIAGNOSIS — J0110 Acute frontal sinusitis, unspecified: Secondary | ICD-10-CM | POA: Insufficient documentation

## 2021-12-28 MED ORDER — AMOXICILLIN-POT CLAVULANATE 875-125 MG PO TABS: 1.0000 | ORAL_TABLET | Freq: Two times a day (BID) | ORAL | 0 refills | Status: DC

## 2021-12-28 NOTE — Assessment & Plan Note (Signed)
sx for 2 weeks, HA, sinus pain. hx of tolerating Augmentin. Sending refill, advised on use & SE, ok to continue home sinus meds, drink plenty of water.

## 2021-12-28 NOTE — Patient Instructions (Addendum)
It was very nice to see you today!  I have sent Augmentin to your pharmacy to treat your sinus infection. Start this today. Drink at least 2 liters of water daily, ok to continue your home sinus medications.  Let us know if not getting better after finishing the medicine.    PLEASE NOTE:  If you had any lab tests please let us know if you have not heard back within a few days. You may see your results on MyChart before we have a chance to review them but we will give you a call once they are reviewed by [place]. If we ordered any referrals today, please let us know if you have not heard from their office within the next week.   Please try these tips to maintain a healthy lifestyle:  Eat most of your calories during the day when you are active. Eliminate processed foods including packaged sweets (pies, cakes, cookies), reduce intake of potatoes, white bread, white pasta, and white rice. Look for whole grain options, oat flour or almond flour.  Each meal should contain half fruits/vegetables, one quarter protein, and one quarter carbs (no bigger than a computer mouse).  Cut down on sweet beverages. This includes juice, soda, and sweet tea. Also watch fruit intake, though this is a healthier sweet option, it still contains natural sugar! Limit to 3 servings daily.  Drink at least 1 glass of water with each meal and aim for at least 8 glasses per day  Exercise at least 150 minutes every week.

## 2021-12-28 NOTE — Progress Notes (Signed)
Subjective:     Patient ID: Morgan [name], female    DOB: 1951-04-24, 71 y.o.   MRN: [id number]  Chief Complaint  Patient presents with   Cough    Dry   Nasal Congestion    Coughing up yellow mucus. Symptoms started 2 weeks.      HPI: Sinusitis: Patient complains of congestion, facial pain, nasal congestion, no  fever, purulent nasal discharge, sinus pressure, and headache , with no fever, chills, night sweats or weight loss. Onset of symptoms was 2 weeks ago, unchanged since that time. She is drinking moderate amounts of fluids.  Past history is significant for no history of pneumonia or bronchitis. Patient is non-smoker.   There are no preventive [name] reminders to display for this patient.  Past Medical History:  Diagnosis Date   Abnormal chest x-ray    Abnormal electrocardiogram    Abscess of sigmoid colon due to diverticulitis 11/04/2017   Allergic rhinitis    Allergy    Cancer ([place])    Skin -sqameous left hand basal cell face   Cataract    MD just watching   Colon polyp    polypoid colorectal mucosa/adenomatous   Complication of anesthesia    and cries   Contact dermatitis due to poison ivy    Cystitis    Diverticulitis with abscess status post robotic low anterior rectosigmoid resection 12/11/2017 12/11/2017   Diffuse diverticulosis entire colon by colonoscopy 01/2019   Diverticulosis of colon    Fatty liver disease, nonalcoholic    GERD (gastroesophageal reflux disease)    occasional   Hearing loss    bilateral, wears hearing aids   Hemorrhoids    Hyperlipidemia    Hypertension    Lymphocytosis    Overweight(278.02)    Pneumonia    2017   PONV (postoperative nausea and vomiting)    Psoriasis 12/27/2020   RA (rheumatoid arthritis) ([place])    Rheumatoid   S/P reverse total shoulder arthroplasty, 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   Venous insufficiency     Past Surgical History:  Procedure Laterality Date   APPLICATION OF A-CELL OF EXTREMITY Left 06/09/2020   Procedure:  APPLICATION OF INTEGRA BILAYER OR PRIMATRIX;  Surgeon: [name], MD;  Location: [facility];  Service: Plastics;  Laterality: Left;   APPLICATION OF WOUND VAC Left 06/09/2020   Procedure: APPLICATION OF WOUND VAC;  Surgeon: [name], MD;  Location: [facility];  Service: Plastics;  Laterality: Left;   APPLICATION OF WOUND VAC Left 07/26/2020   Procedure: APPLICATION OF WOUND VAC;  Surgeon: [name], MD;  Location: [facility];  Service: Plastics;  Laterality: Left;  APPLICATION OF WOUND VAC   CESAREAN SECTION     [phone]   chemical and laser endovenous ablation of LE veins  2008, 2009, 2010, 2011   Dr Eilleen Kempf et al   COLONOSCOPY     multiple   DRESSING CHANGE UNDER ANESTHESIA Right 04/24/2020   Procedure: DRESSING CHANGE UNDER ANESTHESIA right knee;  Surgeon: Sydnee Cabal, MD;  Location: [facility];  Service: Orthopedics;  Laterality: Right;   endoscopy     multiple   FOOT SURGERY     left   I & D EXTREMITY Left 06/09/2020   Procedure: Debridement of left knee wound;  Surgeon: [name], MD;  Location: [facility];  Service: Plastics;  Laterality: Left;  total case 45 min   INCISION AND DRAINAGE Bilateral 04/22/2020   Procedure: INCISION AND DRAINAGE, WOUND VAC PLACEMENT ON LEFT  KNEE;  Surgeon: Rod Can, MD;  Location: [facility];  Service: Orthopedics;  Laterality: Bilateral;   INCISION AND DRAINAGE ABSCESS Left 04/24/2020   Procedure: INCISION AND DRAINAGE LEFT KNEE;  Surgeon: Sydnee Cabal, MD;  Location: [facility];  Service: Orthopedics;  Laterality: Left;   INNER EAR SURGERY Right    childhood; right, ear drum repair   LAPAROSCOPIC LYSIS OF ADHESIONS N/A 12/11/2017   Procedure: LAPAROSCOPIC LYSIS OF ADHESIONS;  Surgeon: Michael Boston, MD;  Location[facility];  Service: General;  Laterality: N/A;   NASAL SEPTUM SURGERY     POLYPECTOMY     PROCTOSCOPY N/A 12/11/2017   Procedure: RIDGED PROCTOSCOPY;  Surgeon: Michael Boston, MD;  Location[facility];  Service: General;  Laterality: N/A;    REVERSE SHOULDER ARTHROPLASTY Right 09/10/2019   Procedure: REVERSE SHOULDER ARTHROPLASTY;  Surgeon: Justice Britain, MD;  Location: [facility];  Service: Orthopedics;  Laterality: Right;  173min   SKIN SPLIT GRAFT Left 07/26/2020   Procedure: SKIN GRAFT SPLIT THICKNESS;  Surgeon: [name], MD;  Location: [facility];  Service: Plastics;  Laterality: Left;  SKIN GRAFT SPLIT THICKNESS   TONSILLECTOMY     TOTAL ABDOMINAL HYSTERECTOMY  12/86   TYMPANOPLASTY Right 11/03/2018   Procedure: TYMPANOPLASTY;  Surgeon: Melida Quitter, MD;  Location: [facility];  Service: ENT;  Laterality: Right;    Outpatient Medications Prior to Visit  Medication Sig Dispense Refill   acetaminophen (TYLENOL) 500 MG tablet Take 1,000 mg by mouth every 6 (six) hours as needed for moderate pain or headache.      aspirin EC 81 MG tablet Take 81 mg by mouth every Monday, Wednesday, and Friday. Swallow whole.     Calcium Carbonate-Vitamin D 600-400 MG-UNIT tablet Take 1 tablet by mouth 2 (two) times daily.      Cholecalciferol (VITAMIN D) 50 MCG (2000 UT) CAPS Take 2,000 Units by mouth daily.      COVID-19 mRNA bivalent vaccine, Pfizer, (PFIZER COVID-19 VAC BIVALENT) injection Inject into the muscle. 0.3 mL 0   doxepin (SINEQUAN) 10 MG capsule Take 10 mg by mouth 2 (two) times daily.     fenofibrate 160 MG tablet TAKE 1 TABLET (160 MG TOTAL) BY MOUTH AT BEDTIME. 90 tablet 3   fexofenadine (ALLEGRA) 180 MG tablet Take 180 mg by mouth daily.     fluorometholone (FLAREX) 0.1 % ophthalmic suspension Flarex 0.1 % eye drops,suspension  ONE drop into BOTH eyes four times A DAY USE FOR ONE TO TWO WEEKS. Pulsed therapy/ can USE UP TO FOUR times PER year     fluticasone (FLONASE) 50 MCG/ACT nasal spray Place 2 sprays into both nostrils daily as needed for allergies. 48 mL 2   furosemide (LASIX) 20 MG tablet Take 1 tablet (20 mg total) by mouth daily as needed for edema. Take with potassium 90 tablet 3   hydroxychloroquine  (PLAQUENIL) 200 MG tablet Take 2 tablets (400 mg total) by mouth daily.     influenza vaccine adjuvanted (FLUAD) 0.5 ML injection Inject into the muscle. 0.5 mL 0   lisinopril (ZESTRIL) 10 MG tablet TAKE 1 TABLET BY MOUTH EVERY DAY 90 tablet 3   Multiple Vitamin (MULTIVITAMIN) tablet Take 1 tablet by mouth daily.     polyethylene glycol (MIRALAX) 17 g packet Take 17 g by mouth daily. 14 each 0   potassium chloride SA (KLOR-CON) 20 MEQ tablet Take 1 tablet (20 mEq total) by mouth daily as needed (leg swelling). Take with furosemide 90  tablet 3   predniSONE (DELTASONE) 5 MG tablet Take 5 mg by mouth daily.  3   Urea 47 % CREA Apply 1 application topically daily as needed (irritation).      valACYclovir (VALTREX) 500 MG tablet Take 500 mg by mouth 2 (two) times daily.     COSENTYX SENSOREADY, 300 MG, 150 MG/ML SOAJ Inject 300 mg as directed every 28 (twenty-eight) days.     simvastatin (ZOCOR) 40 MG tablet TAKE 1 TABLET BY MOUTH EVERYDAY AT BEDTIME 90 tablet 3   Hypromellose (ARTIFICIAL TEARS OP) Place 1 drop into both eyes daily as needed (for dry eyes).     amoxicillin-clavulanate (AUGMENTIN) 875-125 MG tablet Take 1 tablet by mouth 2 (two) times daily. (Patient not taking: Reported on 05/16/2021) 20 tablet 0   docusate sodium (COLACE) 100 MG capsule Take 100 mg by mouth in the morning and at bedtime.  (Patient not taking: Reported on 05/16/2021)     doxycycline (VIBRA-TABS) 100 MG tablet Take 1 tablet (100 mg total) by mouth 2 (two) times daily. (Patient not taking: Reported on 12/28/2021) 14 tablet 0   ondansetron (ZOFRAN) 4 MG tablet TAKE 1 TABLET BY MOUTH EVERY 6 HOURS AS NEEDED FOR NAUSEA (Patient not taking: Reported on 04/28/2021) 20 tablet 1   No facility-administered medications prior to visit.    Allergies  Allergen Reactions   Remicade [Infliximab] Anaphylaxis and Swelling    Facial swelling   Adalimumab     Other reaction(s): lost efficacy, Other (See Comments)   Bee Venom     Other  reaction(s): Unknown   Golimumab     Other reaction(s): Other (See Comments), psoriasis   Poison Ivy Extract     Other reaction(s): Unknown   Vitamin E     Other reaction(s): Other (See Comments)        Objective:    Physical Exam Vitals and nursing note reviewed.  Constitutional:      Appearance: Normal appearance.  HENT:     Right Ear: Ear canal normal. Tympanic membrane is erythematous.     Left Ear: Tympanic membrane and ear canal normal.     Nose:     Right Sinus: Frontal sinus tenderness present.     Left Sinus: Frontal sinus tenderness present.     Mouth/Throat:     Mouth: Mucous membranes are moist.     Pharynx: No pharyngeal swelling or oropharyngeal exudate.     Tonsils: No tonsillar exudate or tonsillar abscesses.  Cardiovascular:     Rate and Rhythm: Normal rate and regular rhythm.  Pulmonary:     Effort: Pulmonary effort is normal.     Breath sounds: Normal breath sounds.  Musculoskeletal:        General: Normal range of motion.  Skin:    General: Skin is warm and dry.  Neurological:     Mental Status: She is alert.  Psychiatric:        Mood and Affect: Mood normal.        Behavior: Behavior normal.    BP 128/73    Pulse 84    Temp 97.6 F (36.4 C) (Temporal)    Ht 5\' 4"  (1.626 m)    Wt 206 lb 3.2 oz (93.5 kg)    LMP  (LMP Unknown)    SpO2 98%    BMI 35.39 kg/m  Wt Readings from Last 3 Encounters:  12/28/21 206 lb 3.2 oz (93.5 kg)  05/16/21 221 lb 6.4 oz (100.4 kg)  04/28/21  223 lb (101.2 kg)       Assessment & Plan:   Problem List Items Addressed This Visit       Respiratory   Acute non-recurrent frontal sinusitis - Primary    sx for 2 weeks, HA, sinus pain. hx of tolerating Augmentin. Sending refill, advised on use & SE, ok to continue home sinus meds, drink plenty of water.       Relevant Medications   amoxicillin-clavulanate (AUGMENTIN) 875-125 MG tablet       Meds ordered this encounter  Medications   amoxicillin-clavulanate  (AUGMENTIN) 875-125 MG tablet    Sig: Take 1 tablet by mouth 2 (two) times daily after a meal.    Dispense:  14 tablet    Refill:  0    Order Specific Question:   Supervising Provider    Answer:   ANDY, CAMILLE L [[id number]]

## 2022-01-11 ENCOUNTER — Other Ambulatory Visit: Payer: Self-pay

## 2022-01-11 ENCOUNTER — Ambulatory Visit (INDEPENDENT_AMBULATORY_CARE_PROVIDER_SITE_OTHER): Payer: BC Managed Care – PPO | Admitting: Family Medicine

## 2022-01-11 VITALS — BP 134/72 | HR 76 | Temp 97.9°F | Ht 64.0 in | Wt 206.8 lb

## 2022-01-11 DIAGNOSIS — Z7952 Long term (current) use of systemic steroids: Secondary | ICD-10-CM

## 2022-01-11 DIAGNOSIS — D72828 Other elevated white blood cell count: Secondary | ICD-10-CM

## 2022-01-11 DIAGNOSIS — E782 Mixed hyperlipidemia: Secondary | ICD-10-CM

## 2022-01-11 DIAGNOSIS — D849 Immunodeficiency, unspecified: Secondary | ICD-10-CM

## 2022-01-11 DIAGNOSIS — I10 Essential (primary) hypertension: Secondary | ICD-10-CM

## 2022-01-11 DIAGNOSIS — K760 Fatty (change of) liver, not elsewhere classified: Secondary | ICD-10-CM | POA: Diagnosis not present

## 2022-01-11 DIAGNOSIS — Z0000 Encounter for general adult medical examination without abnormal findings: Secondary | ICD-10-CM

## 2022-01-11 DIAGNOSIS — M0579 Rheumatoid arthritis with rheumatoid factor of multiple sites without organ or systems involvement: Secondary | ICD-10-CM

## 2022-01-11 DIAGNOSIS — D696 Thrombocytopenia, unspecified: Secondary | ICD-10-CM

## 2022-01-11 LAB — COMPREHENSIVE METABOLIC PANEL
ALT: 28 U/L (ref 0–35)
AST: 39 U/L — ABNORMAL HIGH (ref 0–37)
Albumin: 4.3 g/dL (ref 3.5–5.2)
Alkaline Phosphatase: 48 U/L (ref 39–117)
BUN: 22 mg/dL (ref 6–23)
CO2: 29 mEq/L (ref 19–32)
Calcium: 9.8 mg/dL (ref 8.4–10.5)
Chloride: 104 mEq/L (ref 96–112)
Creatinine, Ser: 0.92 mg/dL (ref 0.40–1.20)
GFR: 62.1 mL/min (ref >60.00)
Glucose, Bld: 92 mg/dL (ref 70–99)
Potassium: 4.3 mEq/L (ref 3.5–5.1)
Sodium: 140 mEq/L (ref 135–145)
Total Bilirubin: 0.5 mg/dL (ref 0.2–1.2)
Total Protein: 7.3 g/dL (ref 6.0–8.3)

## 2022-01-11 LAB — CBC WITH DIFFERENTIAL/PLATELET
Basophils Absolute: 0 10*3/uL (ref 0.0–0.1)
Basophils Relative: 0.3 % (ref 0.0–3.0)
Eosinophils Absolute: 0.1 10*3/uL (ref 0.0–0.7)
Eosinophils Relative: 0.4 % (ref 0.0–5.0)
HCT: 38.9 % (ref 36.0–46.0)
Hemoglobin: 12.7 g/dL (ref 12.0–15.0)
Lymphocytes Relative: 12.4 % (ref 12.0–46.0)
Lymphs Abs: 1.6 10*3/uL (ref 0.7–4.0)
MCHC: 32.7 g/dL (ref 30.0–36.0)
MCV: 98.6 fl (ref 78.0–100.0)
Monocytes Absolute: 3.3 10*3/uL — ABNORMAL HIGH (ref 0.1–1.0)
Monocytes Relative: 24.9 % — ABNORMAL HIGH (ref 3.0–12.0)
Neutro Abs: 8.1 10*3/uL — ABNORMAL HIGH (ref 1.4–7.7)
Neutrophils Relative %: 62 % (ref 43.0–77.0)
Platelets: 89 10*3/uL — ABNORMAL LOW (ref 150.0–400.0)
RBC: 3.94 Mil/uL (ref 3.87–5.11)
RDW: 14.9 % (ref 11.5–15.5)
WBC: 13.1 10*3/uL — ABNORMAL HIGH (ref 4.0–10.5)

## 2022-01-11 LAB — TSH: TSH: 1.5 u[IU]/mL (ref 0.35–5.50)

## 2022-01-11 LAB — LIPID PANEL
Cholesterol: 119 mg/dL (ref 0–200)
HDL: 49.3 mg/dL (ref >39.00)
LDL Cholesterol: 56 mg/dL (ref 0–99)
NonHDL: 69.57
Total CHOL/HDL Ratio: 2
Triglycerides: 68 mg/dL (ref 0.0–149.0)
VLDL: 13.6 mg/dL (ref 0.0–40.0)

## 2022-01-11 NOTE — Patient Instructions (Signed)
Please return in 12 months for your annual complete physical; please come fasting.   I will release your lab results to you on your MyChart account with further instructions. Please reply with any questions.    If you have any questions or concerns, please don't hesitate to send me a message via MyChart or call the office at [phone]. Thank you for visiting with us today! It's our pleasure caring for you.    

## 2022-01-11 NOTE — Progress Notes (Signed)
Subjective  Chief Complaint  Patient presents with   Annual Exam    Fasting    HPI: Morgan [name] is a 71 y.o. female who presents to [facility] today for a Female Wellness Visit. She also has the concerns and/or needs as listed above in the chief complaint. These will be addressed in addition to the Health Maintenance Visit.   Wellness Visit: annual visit with health maintenance review and exam without Pap  Health screenings are up-to-date.  Immunizations are current.  Overall she is doing well.  However had a health scare with loss of vision in her left eye last summer due to a viral infection.  She still undergoing treatment for that.  Fortunately, her left chronic wound has healed.  Recently recovered from acute sinusitis.  Notes reviewed Chronic disease f/u and/or acute problem visit: (deemed necessary to be done in addition to the wellness visit): Chronic medical problems including rheumatoid arthritis and chronic systemic steroids, immunosuppressed state, hypertension, fatty liver, hyperlipidemia and leukocytosis reactive with thrombocytopenia: Seen multiple specialist and I reviewed their notes.  No recent medication changes.  No recent flares.  Denies symptoms of fever, chills, fatigue, mood changes, chest pain, shortness of breath, lower extremity edema. She is fasting for lab recheck  Assessment  1. Annual physical exam   2. Current chronic use of systemic steroids   3. Essential hypertension   4. Fatty liver disease, nonalcoholic   5. Immunosuppressed status ([place])   6. Other elevated white blood cell (WBC) count   7. Mixed hyperlipidemia   8. Rheumatoid arthritis involving multiple sites with positive rheumatoid factor ([place])   9. Thrombocytopenia [facility])      Plan  Female Wellness Visit: Age appropriate Health Maintenance and Prevention measures were discussed with patient. Included topics are cancer screening recommendations, ways to keep  healthy (see AVS) including dietary and exercise recommendations, regular eye and dental care, use of seat belts, and avoidance of moderate alcohol use and tobacco use.  BMI: discussed patient's BMI and encouraged positive lifestyle modifications to help get to or maintain a target BMI. HM needs and immunizations were addressed and ordered. See below for orders. See HM and immunization section for updates. Routine labs and screening tests ordered including cmp, cbc and lipids where appropriate. Discussed recommendations regarding Vit D and calcium supplementation (see AVS)  Chronic disease management visit and/or acute problem visit: Chronic medical problems are well controlled.  Check lab work  Follow up: 12 months for complete physical Orders Placed This Encounter  Procedures   Comprehensive metabolic panel   CBC with Differential/Platelet   Lipid panel   TSH   No orders of the defined types were placed in this encounter.     Body mass index is 35.5 kg/m. Wt Readings from Last 3 Encounters:  01/11/22 206 lb 12.8 oz (93.8 kg)  12/28/21 206 lb 3.2 oz (93.5 kg)  05/16/21 221 lb 6.4 oz (100.4 kg)     Patient Active Problem List   Diagnosis Date Noted   Psoriasis 12/27/2020    Priority: High   Rheumatoid arthritis involving multiple sites with positive rheumatoid factor ([place]) 10/31/2017    Priority: High   Immunosuppressed status ([place]) 10/08/2017    Priority: High    Overview:  Humira for RA; frequent pred; no LIVE vaccines    Obesity with body mass index 30 or greater 01/25/2017    Priority: High   Current chronic use of systemic steroids 06/08/2016  Priority: High   Essential hypertension 10/20/2008    Priority: High   Mixed hyperlipidemia 04/23/2008    Priority: High   H/O partial resection of colon 01/31/2018    Priority: Medium    Leukocytosis 08/27/2014    Priority: Medium     Overview:  Reactive from RA but undergoing eval by heme. Dr. Alen Blew.    Fatty  liver disease, nonalcoholic 85/01/7740    Priority: Medium    Benign colon polyp 10/20/2008    Priority: Medium    Diverticulosis of colon 10/20/2008    Priority: Medium    Venous (peripheral) insufficiency 04/23/2008    Priority: Medium    Gastroesophageal reflux disease without esophagitis 04/23/2008    Priority: Medium    Bilateral sensorineural hearing loss 02/02/2019    Priority: Low   Uses hearing aid 12/09/2015    Priority: Low   Thrombocytopenia ([place]) 11/23/2013    Priority: Low   Allergic rhinitis 04/23/2008    Priority: Low   Screening for osteoporosis 10/09/2021    Normal Dexa 10/2021    Health Maintenance  Topic Date Due   MAMMOGRAM  10/04/2022   COLONOSCOPY (Pts 45-67yrs Insurance coverage will need to be confirmed)  01/09/2024   DEXA SCAN  10/04/2026   TETANUS/TDAP  12/10/2027   Pneumonia Vaccine 59+ Years old  Completed   INFLUENZA VACCINE  Completed   COVID-19 Vaccine  Completed   Hepatitis C Screening  Completed   HPV VACCINES  Aged Out   Zoster Vaccines- Shingrix  Discontinued   Immunization History  Administered Date(s) Administered   Fluad Quad(high Dose 65+) 10/01/2019, 09/22/2020   Influenza Split 01/09/2012, 08/25/2012, 09/30/2013   Influenza Whole 12/12/2009   Influenza, High Dose Seasonal PF 09/03/2016, 09/19/2017, 09/11/2018   Influenza,inj,quad, With Preservative 09/02/2018, 08/04/2019   Influenza-Unspecified 08/03/2014, 08/04/2015, 08/03/2016, 09/16/2017   Moderna Covid-19 Vaccine Bivalent Booster 58yrs & up 10/05/2021   Moderna SARS-COV2 Booster Vaccination 01/27/2021   Moderna Sars-Covid-2 Vaccination 01/01/2020, 01/29/2020, 08/19/2020   Pneumococcal Conjugate-13 08/27/2014   Pneumococcal Polysaccharide-23 12/09/2015   Pneumococcal-Unspecified 08/03/2014, 12/04/2015   Tdap 12/09/2017   We updated and reviewed the patient's past history in detail and it is documented below. Allergies: Patient is allergic to remicade [infliximab],  adalimumab, bee venom, golimumab, poison ivy extract, and vitamin e. Past Medical History Patient  has a past medical history of Abnormal chest x-ray, Abnormal electrocardiogram, Abscess of sigmoid colon due to diverticulitis (11/04/2017), Allergic rhinitis, Allergy, Cancer ([place]), Cataract, Colon polyp, Complication of anesthesia, Contact dermatitis due to poison ivy, Cystitis, Diverticulitis with abscess status post robotic low anterior rectosigmoid resection 12/11/2017 (12/11/2017), Diverticulosis of colon, Fatty liver disease, nonalcoholic, GERD (gastroesophageal reflux disease), Hearing loss, Hemorrhoids, Hyperlipidemia, Hypertension, Lymphocytosis, Overweight(278.02), Pneumonia, PONV (postoperative nausea and vomiting), Psoriasis (12/27/2020), RA (rheumatoid arthritis) ([place]), S/P reverse total shoulder arthroplasty, 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 Past Surgical History Patient  has a past surgical history that includes Total abdominal hysterectomy (12/86); Inner ear surgery (Right); Nasal septum surgery; Foot surgery; chemical and laser endovenous ablation of LE veins (2008, 2009, 2010, 2011); Cesarean section; Proctoscopy (N/A, 12/11/2017); Laparoscopic lysis of adhesions (N/A, 12/11/2017); Tympanoplasty (Right, 11/03/2018); Colonoscopy; Polypectomy; Reverse shoulder arthroplasty (Right, 09/10/2019); Incision and drainage (Bilateral, 04/22/2020); Incision and drainage abscess (Left, 04/24/2020); Dressing change under anesthesia (Right, 04/24/2020); endoscopy; I & D extremity (Left, 06/09/2020); Application of a-cell of extremity (Left, 06/09/2020); Application if wound vac (Left, 06/09/2020); Tonsillectomy; Skin split graft (Left, 2/87/8676); and Application if wound vac (Left, 07/26/2020). Family History: Patient family  history includes Arthritis in her mother; Atrial fibrillation in her mother; Breast cancer in an other family member; COPD in her mother; Diabetes in her maternal aunt, maternal uncle, and  sister; Healthy in her sister; Heart disease in her mother and sister; Heart failure in her brother and son; Hyperlipidemia in her mother; Hypertension in her mother; Liver disease in her sister. Social History:  Patient  reports that she quit smoking about 20 years ago. Her smoking use included cigarettes. She has a 7.50 pack-year smoking history. She has never used smokeless tobacco. She reports current alcohol use. She reports that she does not use drugs.  Review of Systems: Constitutional: negative for fever or malaise Ophthalmic: negative for photophobia, double vision or loss of vision Cardiovascular: negative for chest pain, dyspnea on exertion, or new LE swelling Respiratory: negative for SOB or persistent cough Gastrointestinal: negative for abdominal pain, change in bowel habits or melena Genitourinary: negative for dysuria or gross hematuria, no abnormal uterine bleeding or disharge Musculoskeletal: negative for new gait disturbance or muscular weakness Integumentary: negative for new or persistent rashes, no breast lumps Neurological: negative for TIA or stroke symptoms Psychiatric: negative for SI or delusions Allergic/Immunologic: negative for hives  Patient Care Team    Relationship Specialty Notifications Start End  [name], MD PCP - General Family Medicine  09/01/14   Michael Boston, MD Consulting Physician General Surgery  10/08/17   Irene Shipper, MD Consulting Physician Gastroenterology  10/10/17   Wyatt Portela, MD Consulting Physician Hematology and Oncology  10/10/17   Michel Bickers, MD Consulting Physician Infectious Diseases  11/04/17   Gavin Pound, MD Consulting Physician Rheumatology  11/04/17   Justice Britain, MD Consulting Physician Orthopedic Surgery  03/01/20     Objective  Vitals: BP 134/72    Pulse 76    Temp 97.9 F (36.6 C) (Temporal)    Ht 5\' 4"  (1.626 m)    Wt 206 lb 12.8 oz (93.8 kg)    LMP  (LMP Unknown)    SpO2 98%    BMI 35.50 kg/m   General:  Well developed, well nourished, no acute distress  Psych:  Alert and orientedx3,normal mood and affect HEENT:  Normocephalic, atraumatic, non-icteric sclera,  supple neck without adenopathy, mass or thyromegaly, chronic changes in bilateral TMs without erythema, chronic perforation of right TM Cardiovascular:  Normal S1, S2, RRR without gallop, rub or murmur Respiratory:  Good breath sounds bilaterally, CTAB with normal respiratory effort Gastrointestinal: normal bowel sounds, soft, non-tender, no noted masses. No HSM MSK: Joints are without erythema or swelling.  Skin:  Warm, no rashes or suspicious lesions noted, 3 small scabbed areas over left knee without warmth erythema or drainage   Commons side effects, risks, benefits, and alternatives for medications and treatment plan prescribed today were discussed, and the patient expressed understanding of the given instructions. Patient is instructed to call or message via MyChart if he/she has any questions or concerns regarding our treatment plan. No barriers to understanding were identified. We discussed Red Flag symptoms and signs in detail. Patient expressed understanding regarding what to do in case of urgent or emergency type symptoms.  Medication list was reconciled, printed and provided to the patient in AVS. Patient instructions and summary information was reviewed with the patient as documented in the AVS. This note was prepared with assistance of Dragon voice recognition software. Occasional wrong-word or sound-a-like substitutions may have occurred due to the inherent limitations of voice recognition software  This  visit occurred during the SARS-CoV-2 public health emergency.  Safety protocols were in place, including screening questions prior to the visit, additional usage of staff PPE, and extensive cleaning of exam room while observing appropriate contact time as indicated for disinfecting solutions.

## 2022-01-17 ENCOUNTER — Encounter: Payer: Self-pay | Admitting: Family Medicine

## 2022-01-19 DIAGNOSIS — L570 Actinic keratosis: Secondary | ICD-10-CM | POA: Diagnosis not present

## 2022-01-19 DIAGNOSIS — D485 Neoplasm of uncertain behavior of skin: Secondary | ICD-10-CM | POA: Diagnosis not present

## 2022-01-19 DIAGNOSIS — D225 Melanocytic nevi of trunk: Secondary | ICD-10-CM | POA: Diagnosis not present

## 2022-01-19 DIAGNOSIS — L578 Other skin changes due to chronic exposure to nonionizing radiation: Secondary | ICD-10-CM | POA: Diagnosis not present

## 2022-01-19 DIAGNOSIS — L988 Other specified disorders of the skin and subcutaneous tissue: Secondary | ICD-10-CM | POA: Diagnosis not present

## 2022-01-26 ENCOUNTER — Other Ambulatory Visit: Payer: Self-pay | Admitting: Family Medicine

## 2022-02-02 DIAGNOSIS — L0291 Cutaneous abscess, unspecified: Secondary | ICD-10-CM | POA: Diagnosis not present

## 2022-03-29 ENCOUNTER — Other Ambulatory Visit (HOSPITAL_BASED_OUTPATIENT_CLINIC_OR_DEPARTMENT_OTHER): Payer: Self-pay

## 2022-03-29 ENCOUNTER — Ambulatory Visit: Payer: BC Managed Care – PPO | Attending: Internal Medicine

## 2022-03-29 DIAGNOSIS — Z79899 Other long term (current) drug therapy: Secondary | ICD-10-CM | POA: Diagnosis not present

## 2022-03-29 DIAGNOSIS — L4050 Arthropathic psoriasis, unspecified: Secondary | ICD-10-CM | POA: Diagnosis not present

## 2022-03-29 DIAGNOSIS — L409 Psoriasis, unspecified: Secondary | ICD-10-CM | POA: Diagnosis not present

## 2022-03-29 DIAGNOSIS — Z23 Encounter for immunization: Secondary | ICD-10-CM

## 2022-03-29 DIAGNOSIS — M1991 Primary osteoarthritis, unspecified site: Secondary | ICD-10-CM | POA: Diagnosis not present

## 2022-03-29 MED ORDER — MODERNA COVID-19 BIVAL BOOSTER 50 MCG/0.5ML IM SUSP
INTRAMUSCULAR | 0 refills | Status: DC
  Filled 2022-03-29: qty 0.5, 1d supply, fill #0

## 2022-03-29 NOTE — Progress Notes (Signed)
? ?  Covid-19 Vaccination [facility] ? ?Name:  Morgan [name]    ?MRN: [id number] ?DOB: 23-Sep-1951 ? ?03/29/2022 ? ?Ms. Morgan [name] was observed post Covid-19 immunization for 15 minutes without incident. She was provided with Vaccine Information Sheet and instruction to access the V-Safe system.  ? ?Ms. Morgan [name] was instructed to call 911 with any severe reactions post vaccine: ?Difficulty breathing  ?Swelling of face and throat  ?A fast heartbeat  ?A bad rash all over body  ?Dizziness and weakness  ? ?Immunizations Administered   ? ? Name Date Dose VIS Date Route  ? Moderna Covid-19 vaccine Bivalent Booster 03/29/2022 11:38 AM 0.5 mL 07/15/2021 Intramuscular  ? Manufacturer: Moderna  ? Lot: [id number]  ? [place]: 80777-282-99  ? ?  ? ?

## 2022-04-02 ENCOUNTER — Other Ambulatory Visit: Payer: Self-pay | Admitting: Family Medicine

## 2022-04-02 DIAGNOSIS — B9689 Other specified bacterial agents as the cause of diseases classified elsewhere: Secondary | ICD-10-CM

## 2022-04-05 ENCOUNTER — Encounter: Payer: Self-pay | Admitting: Family Medicine

## 2022-04-05 MED ORDER — AZITHROMYCIN 250 MG PO TABS: ORAL_TABLET | ORAL | 0 refills | Status: DC

## 2022-04-25 ENCOUNTER — Telehealth (INDEPENDENT_AMBULATORY_CARE_PROVIDER_SITE_OTHER): Payer: BC Managed Care – PPO | Admitting: Family

## 2022-04-25 ENCOUNTER — Encounter: Payer: Self-pay | Admitting: Family

## 2022-04-25 VITALS — Temp 98.0°F | Ht 64.0 in | Wt 206.0 lb

## 2022-04-25 DIAGNOSIS — U071 COVID-19: Secondary | ICD-10-CM | POA: Diagnosis not present

## 2022-04-25 MED ORDER — BENZONATATE 200 MG PO CAPS: 200.0000 mg | ORAL_CAPSULE | Freq: Three times a day (TID) | ORAL | 0 refills | Status: DC | PRN

## 2022-04-25 MED ORDER — MOLNUPIRAVIR EUA 200MG CAPSULE: 4.0000 | ORAL_CAPSULE | Freq: Two times a day (BID) | ORAL | 0 refills | Status: AC

## 2022-04-25 NOTE — Progress Notes (Signed)
MyChart Video Visit    Virtual Visit via Video Note   This visit type was conducted due to national recommendations for restrictions regarding the COVID-19 Pandemic (e.g. social distancing) in an effort to limit this patient's exposure and mitigate transmission in our community. This patient is at least at moderate risk for complications without adequate follow up. This format is felt to be most appropriate for this patient at this time. Physical exam was limited by quality of the video and audio technology used for the visit. CMA was able to get the patient set up on a video visit.  Patient location: Home. Patient and provider in visit Provider location: Office  I discussed the limitations of evaluation and management by telemedicine and the availability of in person appointments. The patient expressed understanding and agreed to proceed.  Visit Date: 04/25/2022  Today's healthcare provider: Jeanie Sewer, NP     Subjective:    Patient ID: Morgan [name], female    DOB: 10/06/1951, 71 y.o.   MRN: [id number]  Chief Complaint  Patient presents with   Covid Positive    Pt tested positive for COVID this morning, Headache, Dry cough, body aches/chills, fever 101.0 this morning. Has tried Tylenol which did help a little.    HPI Upper Respiratory Infection: Symptoms include fever 101, headache described as all over, and non productive cough.  Onset of symptoms was 1 day ago, gradually worsening since that time. She is drinking moderate amounts of fluids. Evaluation to date: none.  Treatment to date:  Tylenol .   Assessment & Plan:   Problem List Items Addressed This Visit   None Visit Diagnoses     COVID-19    -  Primary Sending Molnupiravir, pt advised of FDA label for emergency use, how to take, & SE. Advised of CDC guidelines for masking. Advised of safe practice guidelines. Encouraged to monitor & notify office of any worsening symptoms: increased shortness of breath,  weakness, and signs of dehydration. Instructed to rest and hydrate well.  OK to continue Tylenol prn for fever, aches. Sending Tessalon pearles for cough, advised on use & SE.     Relevant Medications   molnupiravir EUA (LAGEVRIO) 200 mg CAPS capsule   benzonatate (TESSALON) 200 MG capsule       Past Medical History:  Diagnosis Date   Abnormal chest x-ray    Abnormal electrocardiogram    Abscess of sigmoid colon due to diverticulitis 11/04/2017   Allergic rhinitis    Allergy    Cancer ([place])    Skin -sqameous left hand basal cell face   Cataract    MD just watching   Colon polyp    polypoid colorectal mucosa/adenomatous   Complication of anesthesia    and cries   Contact dermatitis due to poison ivy    Cystitis    Diverticulitis with abscess status post robotic low anterior rectosigmoid resection 12/11/2017 12/11/2017   Diffuse diverticulosis entire colon by colonoscopy 01/2019   Diverticulosis of colon    Fatty liver disease, nonalcoholic    GERD (gastroesophageal reflux disease)    occasional   Hearing loss    bilateral, wears hearing aids   Hemorrhoids    Hyperlipidemia    Hypertension    Lymphocytosis    Overweight(278.02)    Pneumonia    2017   PONV (postoperative nausea and vomiting)    Psoriasis 12/27/2020   RA (rheumatoid arthritis) (HCC)    Rheumatoid   S/P reverse total shoulder arthroplasty,  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   Venous insufficiency     Past Surgical History:  Procedure Laterality Date   APPLICATION OF A-CELL OF EXTREMITY Left 06/09/2020   Procedure: APPLICATION OF INTEGRA BILAYER OR PRIMATRIX;  Surgeon: [name], MD;  Location: [facility];  Service: Plastics;  Laterality: Left;   APPLICATION OF WOUND VAC Left 06/09/2020   Procedure: APPLICATION OF WOUND VAC;  Surgeon: [name], MD;  Location: [facility];  Service: Plastics;  Laterality: Left;   APPLICATION OF WOUND VAC Left 07/26/2020   Procedure: APPLICATION OF WOUND VAC;  Surgeon: [name], MD;   Location: [facility];  Service: Plastics;  Laterality: Left;  APPLICATION OF WOUND VAC   CESAREAN SECTION     [phone]   chemical and laser endovenous ablation of LE veins  2008, 2009, 2010, 2011   Dr Eilleen Kempf et al   COLONOSCOPY     multiple   DRESSING CHANGE UNDER ANESTHESIA Right 04/24/2020   Procedure: DRESSING CHANGE UNDER ANESTHESIA right knee;  Surgeon: Sydnee Cabal, MD;  Location: [facility];  Service: Orthopedics;  Laterality: Right;   endoscopy     multiple   FOOT SURGERY     left   I & D EXTREMITY Left 06/09/2020   Procedure: Debridement of left knee wound;  Surgeon: [name], MD;  Location: [facility];  Service: Plastics;  Laterality: Left;  total case 45 min   INCISION AND DRAINAGE Bilateral 04/22/2020   Procedure: INCISION AND DRAINAGE, WOUND VAC PLACEMENT ON LEFT KNEE;  Surgeon: Rod Can, MD;  Location: [facility];  Service: Orthopedics;  Laterality: Bilateral;   INCISION AND DRAINAGE ABSCESS Left 04/24/2020   Procedure: INCISION AND DRAINAGE LEFT KNEE;  Surgeon: Sydnee Cabal, MD;  Location: [facility];  Service: Orthopedics;  Laterality: Left;   INNER EAR SURGERY Right    childhood; right, ear drum repair   LAPAROSCOPIC LYSIS OF ADHESIONS N/A 12/11/2017   Procedure: LAPAROSCOPIC LYSIS OF ADHESIONS;  Surgeon: Michael Boston, MD;  Location: [facility];  Service: General;  Laterality: N/A;   NASAL SEPTUM SURGERY     POLYPECTOMY     PROCTOSCOPY N/A 12/11/2017   Procedure: RIDGED PROCTOSCOPY;  Surgeon: Michael Boston, MD;  Location: [facility];  Service: General;  Laterality: N/A;   REVERSE SHOULDER ARTHROPLASTY Right 09/10/2019   Procedure: REVERSE SHOULDER ARTHROPLASTY;  Surgeon: Justice Britain, MD;  Location: [facility];  Service: Orthopedics;  Laterality: Right;  152mn   SKIN SPLIT GRAFT Left 07/26/2020   Procedure: SKIN GRAFT SPLIT THICKNESS;  Surgeon: PCindra Presume MD;  Location[place] [facility]  Service: Plastics;  Laterality: Left;  SKIN GRAFT SPLIT THICKNESS   TONSILLECTOMY     TOTAL ABDOMINAL  HYSTERECTOMY  12/86   TYMPANOPLASTY Right 11/03/2018   Procedure: TYMPANOPLASTY;  Surgeon: BMelida Quitter MD;  [place] [facility]  Service: E[place];  Laterality: Right;    Outpatient Medications Prior to Visit  Medication Sig Dispense Refill   acetaminophen (TYLENOL) 500 MG tablet Take 1,000 mg by mouth every 6 (six) hours as needed for moderate pain or headache.      aspirin EC 81 MG tablet Take 81 mg by mouth every Monday, Wednesday, and Friday. Swallow whole.     azithromycin (ZITHROMAX) 250 MG tablet Take 2 tabs today, then 1 tab daily for 4 days 1 each 0   Calcium Carbonate-Vitamin D 600-400 MG-UNIT tablet Take 1 tablet by mouth 2 (two) times daily.      Cholecalciferol (VITAMIN D) 50  MCG (2000 UT) CAPS Take 2,000 Units by mouth daily.      COVID-19 mRNA bivalent vaccine, Moderna, (MODERNA COVID-19 BIVAL BOOSTER) 50 MCG/0.5ML injection Inject into the muscle. 0.5 mL 0   doxepin (SINEQUAN) 10 MG capsule Take 10 mg by mouth 2 (two) times daily.     fenofibrate 160 MG tablet TAKE 1 TABLET BY MOUTH AT BEDTIME. 90 tablet 3   fexofenadine (ALLEGRA) 180 MG tablet Take 180 mg by mouth daily.     fluticasone (FLONASE) 50 MCG/ACT nasal spray PLACE 2 SPRAYS INTO BOTH NOSTRILS DAILY AS NEEDED FOR ALLERGIES. 48 mL 2   furosemide (LASIX) 20 MG tablet Take 1 tablet (20 mg total) by mouth daily as needed for edema. Take with potassium 90 tablet 3   hydroxychloroquine (PLAQUENIL) 200 MG tablet Take 2 tablets (400 mg total) by mouth daily.     influenza vaccine adjuvanted (FLUAD) 0.5 ML injection Inject into the muscle. 0.5 mL 0   lisinopril (ZESTRIL) 10 MG tablet TAKE 1 TABLET BY MOUTH EVERY DAY 90 tablet 3   Multiple Vitamin (MULTIVITAMIN) tablet Take 1 tablet by mouth daily.     polyethylene glycol (MIRALAX) 17 g packet Take 17 g by mouth daily. 14 each 0   potassium chloride SA (KLOR-CON) 20 MEQ tablet Take 1 tablet (20 mEq total) by mouth daily as needed (leg swelling). Take with  furosemide 90 tablet 3   predniSONE (DELTASONE) 5 MG tablet Take 5 mg by mouth daily.  3   Urea 47 % CREA Apply 1 application topically daily as needed (irritation).      valACYclovir (VALTREX) 500 MG tablet Take 500 mg by mouth 2 (two) times daily.     amoxicillin-clavulanate (AUGMENTIN) 875-125 MG tablet Take 1 tablet by mouth 2 (two) times daily after a meal. 14 tablet 0   COVID-19 mRNA bivalent vaccine, Pfizer, (PFIZER COVID-19 VAC BIVALENT) injection Inject into the muscle. 0.3 mL 0   No facility-administered medications prior to visit.    Allergies  Allergen Reactions   Remicade [Infliximab] Anaphylaxis and Swelling    Facial swelling   Adalimumab     Other reaction(s): lost efficacy, Other (See Comments)   Bee Venom     Other reaction(s): Unknown   Golimumab     Other reaction(s): Other (See Comments), psoriasis   Poison Ivy Extract     Other reaction(s): Unknown   Vitamin E     Other reaction(s): Other (See Comments)       Objective:     Physical Exam Vitals and nursing note reviewed.  Constitutional:      General: She is not in acute distress.    Appearance: Normal appearance.  HENT:     Head: Normocephalic.  Pulmonary:     Effort: No respiratory distress.  Musculoskeletal:     Cervical back: Normal range of motion.  Skin:    General: Skin is dry.     Coloration: Skin is not pale.  Neurological:     Mental Status: She is alert and oriented to person, place, and time.  Psychiatric:        Mood and Affect: Mood normal.   Temp 98 F (36.7 C) (Axillary)   Ht '5\' 4"'$  (1.626 m)   Wt 206 lb (93.4 kg)   LMP  (LMP Unknown)   BMI 35.36 kg/m   Wt Readings from Last 3 Encounters:  04/25/22 206 lb (93.4 kg)  01/11/22 206 lb 12.8 oz (93.8 kg)  12/28/21 206 lb  3.2 oz (93.5 kg)    I discussed the assessment and treatment plan with the patient. The patient was provided an opportunity to ask questions and all were answered. The patient agreed with the plan and  demonstrated an understanding of the instructions.   The patient was advised to call back or seek an in-person evaluation if the symptoms worsen or if the condition fails to improve as anticipated.  I provided 22 minutes of face-to-face time during this encounter.  Jeanie Sewer, NP [place] [phone] (phone) [phone] (fax)  [place]

## 2022-05-01 ENCOUNTER — Other Ambulatory Visit: Payer: Self-pay | Admitting: Family

## 2022-05-01 DIAGNOSIS — U071 COVID-19: Secondary | ICD-10-CM

## 2022-05-02 ENCOUNTER — Encounter: Payer: Self-pay | Admitting: Family Medicine

## 2022-05-02 DIAGNOSIS — U071 COVID-19: Secondary | ICD-10-CM

## 2022-05-03 MED ORDER — BENZONATATE 200 MG PO CAPS: 200.0000 mg | ORAL_CAPSULE | Freq: Three times a day (TID) | ORAL | 0 refills | Status: DC | PRN

## 2022-05-07 ENCOUNTER — Encounter: Payer: Self-pay | Admitting: Family Medicine

## 2022-07-11 ENCOUNTER — Encounter: Payer: Self-pay | Admitting: Family Medicine

## 2022-07-11 MED ORDER — AZITHROMYCIN 250 MG PO TABS: ORAL_TABLET | ORAL | 0 refills | Status: DC

## 2022-08-01 ENCOUNTER — Encounter: Payer: Self-pay | Admitting: Family Medicine

## 2022-08-01 ENCOUNTER — Ambulatory Visit: Payer: BC Managed Care – PPO | Admitting: Family Medicine

## 2022-08-01 VITALS — BP 138/64 | HR 66 | Temp 97.6°F | Ht 64.0 in | Wt 203.0 lb

## 2022-08-01 DIAGNOSIS — I872 Venous insufficiency (chronic) (peripheral): Secondary | ICD-10-CM

## 2022-08-01 DIAGNOSIS — S80212D Abrasion, left knee, subsequent encounter: Secondary | ICD-10-CM

## 2022-08-01 MED ORDER — DOXYCYCLINE HYCLATE 100 MG PO TABS: 100.0000 mg | ORAL_TABLET | Freq: Two times a day (BID) | ORAL | 0 refills | Status: DC

## 2022-08-01 NOTE — Patient Instructions (Signed)
Please follow up as scheduled for your next visit with me: 01/14/2023   If you have any questions or concerns, please don't hesitate to send me a message via MyChart or call the office at [phone]. Thank you for visiting with us today! It's our pleasure caring for you.  

## 2022-08-02 ENCOUNTER — Encounter: Payer: Self-pay | Admitting: Family Medicine

## 2022-08-02 NOTE — Progress Notes (Signed)
Subjective  CC:  Chief Complaint  Patient presents with   Fall    Pt stated that she fell during vacation and bruised both knees and has a cut(skin tear) on Lt knee    HPI: Morgan [name] is a 71 y.o. female who presents to the office today to address the problems listed above in the chief complaint. 71 year old with multiple comorbidities and chronically immunosuppressed presents approximately 10 days after falling and landing on her left knee.  She was traveling in [place] slipped on slippery gravel.  Landed directly on her left knee.  This is the knee where she had a prolonged course with a nonhealing wound requiring wound care and plastic surgery.  Overall, she is doing well.  She did receive some medical care from the traveling doctor there.  She reports it is improving however the abrasion persists.  She does have 1 small nodular area at the top of the knee that is a little bit red.  She is noted some lower extremity edema and persistent bruising.  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  She denies fevers or chills.  It is sore but not painful.  She has been using Neosporin daily dressings.  Assessment  1. Abrasion, knee, left, subsequent encounter   2. Venous (peripheral) insufficiency      Plan  Abrasion: Given small pustule and mild increase in redness will cover with antibiotics.  Doxycycline 100 twice daily for a week.  Discussed wound care.  Monitor closely.  If worsening, directly to wound care.  Patient understands and agrees Venous insufficiency: Continue elevation.  Dependent edema should improve.  Low-sodium diet.  Follow up: As scheduled 01/14/2023  No orders of the defined types were placed in this encounter.  Meds ordered this encounter  Medications   doxycycline (VIBRA-TABS) 100 MG tablet    Sig: Take 1 tablet (100 mg total) by mouth 2 (two) times daily for 7 days.    Dispense:  14 tablet    Refill:  0      I reviewed the patients updated PMH, FH, and SocHx.     Patient Active Problem List   Diagnosis Date Noted   Psoriasis 12/27/2020    Priority: High   Rheumatoid arthritis involving multiple sites with positive rheumatoid factor ([place]) 10/31/2017    Priority: High   Immunosuppressed status ([place]) 10/08/2017    Priority: High   Obesity with body mass index 30 or greater 01/25/2017    Priority: High   Current chronic use of systemic steroids 06/08/2016    Priority: High   Essential hypertension 10/20/2008    Priority: High   Mixed hyperlipidemia 04/23/2008    Priority: High   H/O partial resection of colon 01/31/2018    Priority: Medium    Leukocytosis 08/27/2014    Priority: Medium    Fatty liver disease, nonalcoholic 94/70/9628    Priority: Medium    Benign colon polyp 10/20/2008    Priority: Medium    Diverticulosis of colon 10/20/2008    Priority: Medium    Venous (peripheral) insufficiency 04/23/2008    Priority: Medium    Gastroesophageal reflux disease without esophagitis 04/23/2008    Priority: Medium    Bilateral sensorineural hearing loss 02/02/2019    Priority: Low   Uses hearing aid 12/09/2015    Priority: Low   Thrombocytopenia ([place]) 11/23/2013    Priority: Low   Allergic rhinitis 04/23/2008    Priority: Low   Screening for osteoporosis 10/09/2021   Current Meds  Medication Sig   acetaminophen (TYLENOL) 500 MG tablet Take 1,000 mg by mouth every 6 (six) hours as needed for moderate pain or headache.    aspirin EC 81 MG tablet Take 81 mg by mouth every Monday, Wednesday, and Friday. Swallow whole.   Calcium Carbonate-Vitamin D 600-400 MG-UNIT tablet Take 1 tablet by mouth 2 (two) times daily.    Cholecalciferol (VITAMIN D) 50 MCG (2000 UT) CAPS Take 2,000 Units by mouth daily.    doxepin (SINEQUAN) 10 MG capsule Take 10 mg by mouth 2 (two) times daily.   doxycycline (VIBRA-TABS) 100 MG tablet Take 1 tablet (100 mg total) by mouth 2 (two) times daily for 7 days.   fenofibrate 160 MG tablet TAKE 1 TABLET BY MOUTH AT  BEDTIME.   fexofenadine (ALLEGRA) 180 MG tablet Take 180 mg by mouth daily.   fluticasone (FLONASE) 50 MCG/ACT nasal spray PLACE 2 SPRAYS INTO BOTH NOSTRILS DAILY AS NEEDED FOR ALLERGIES.   furosemide (LASIX) 20 MG tablet Take 1 tablet (20 mg total) by mouth daily as needed for edema. Take with potassium   hydroxychloroquine (PLAQUENIL) 200 MG tablet Take 2 tablets (400 mg total) by mouth daily.   lisinopril (ZESTRIL) 10 MG tablet TAKE 1 TABLET BY MOUTH EVERY DAY   Multiple Vitamin (MULTIVITAMIN) tablet Take 1 tablet by mouth daily.   polyethylene glycol (MIRALAX) 17 g packet Take 17 g by mouth daily.   potassium chloride SA (KLOR-CON) 20 MEQ tablet Take 1 tablet (20 mEq total) by mouth daily as needed (leg swelling). Take with furosemide   predniSONE (DELTASONE) 5 MG tablet Take 5 mg by mouth daily.   Urea 47 % CREA Apply 1 application topically daily as needed (irritation).    valACYclovir (VALTREX) 500 MG tablet Take 500 mg by mouth 2 (two) times daily.   [DISCONTINUED] benzonatate (TESSALON) 200 MG capsule Take 1 capsule (200 mg total) by mouth 3 (three) times daily as needed for cough.   [DISCONTINUED] COVID-19 mRNA bivalent vaccine, Moderna, (MODERNA COVID-19 BIVAL BOOSTER) 50 MCG/0.5ML injection Inject into the muscle.   [DISCONTINUED] influenza vaccine adjuvanted (FLUAD) 0.5 ML injection Inject into the muscle.    Allergies: Patient is allergic to remicade [infliximab], adalimumab, bee venom, golimumab, poison ivy extract, and vitamin e. Family History: Patient family history includes Arthritis in her mother; Atrial fibrillation in her mother; Breast cancer in an other family member; COPD in her mother; Diabetes in her maternal aunt, maternal uncle, and sister; Healthy in her sister; Heart disease in her mother and sister; Heart failure in her brother and son; Hyperlipidemia in her mother; Hypertension in her mother; Liver disease in her sister. Social History:  Patient  reports that  she quit smoking about 20 years ago. Her smoking use included cigarettes. She has a 7.50 pack-year smoking history. She has never used smokeless tobacco. She reports current alcohol use. She reports that she does not use drugs.  Review of Systems: Constitutional: Negative for fever malaise or anorexia Cardiovascular: negative for chest pain Respiratory: negative for SOB or persistent cough Gastrointestinal: negative for abdominal pain  Objective  Vitals: BP 138/64   Pulse 66   Temp 97.6 F (36.4 C)   Ht '5\' 4"'$  (1.626 m)   Wt 203 lb (92.1 kg)   LMP  (LMP Unknown)   SpO2 97%   BMI 34.84 kg/m  General: no acute distress , A&Ox3 Left knee: Vertical linear abrasion with pink granulation tissue, no crusting or pus.  No surrounding erythema.  Proximal to the abrasion, 3 mm pustule present with surrounding erythema.  Minimally tender.  She has anterior shin ecchymosis and +1 pitting edema.   Commons side effects, risks, benefits, and alternatives for medications and treatment plan prescribed today were discussed, and the patient expressed understanding of the given instructions. Patient is instructed to call or message via MyChart if he/she has any questions or concerns regarding our treatment plan. No barriers to understanding were identified. We discussed Red Flag symptoms and signs in detail. Patient expressed understanding regarding what to do in case of urgent or emergency type symptoms.  Medication list was reconciled, printed and provided to the patient in AVS. Patient instructions and summary information was reviewed with the patient as documented in the AVS. This note was prepared with assistance of Dragon voice recognition software. Occasional wrong-word or sound-a-like substitutions may have occurred due to the inherent limitations of voice recognition software  This visit occurred during the SARS-CoV-2 public health emergency.  Safety protocols were in place, including screening  questions prior to the visit, additional usage of staff PPE, and extensive cleaning of exam room while observing appropriate contact time as indicated for disinfecting solutions.

## 2022-08-03 ENCOUNTER — Ambulatory Visit: Payer: BC Managed Care – PPO | Admitting: Family Medicine

## 2022-08-03 ENCOUNTER — Encounter: Payer: Self-pay | Admitting: Family Medicine

## 2022-08-03 ENCOUNTER — Telehealth: Payer: Self-pay | Admitting: Family Medicine

## 2022-08-03 VITALS — BP 138/78 | HR 70 | Temp 97.8°F | Ht 64.0 in | Wt 201.8 lb

## 2022-08-03 DIAGNOSIS — T148XXA Other injury of unspecified body region, initial encounter: Secondary | ICD-10-CM

## 2022-08-03 DIAGNOSIS — L089 Local infection of the skin and subcutaneous tissue, unspecified: Secondary | ICD-10-CM

## 2022-08-03 DIAGNOSIS — Z8614 Personal history of Methicillin resistant Staphylococcus aureus infection: Secondary | ICD-10-CM

## 2022-08-03 DIAGNOSIS — S80212D Abrasion, left knee, subsequent encounter: Secondary | ICD-10-CM | POA: Diagnosis not present

## 2022-08-03 MED ORDER — AMOXICILLIN-POT CLAVULANATE 875-125 MG PO TABS: 1.0000 | ORAL_TABLET | Freq: Two times a day (BID) | ORAL | 0 refills | Status: AC

## 2022-08-03 NOTE — Telephone Encounter (Signed)
Pt saw Dr. Jonni Sanger this morning.

## 2022-08-03 NOTE — Telephone Encounter (Signed)
Pt see Dr. Jonni Sanger this morning.

## 2022-08-03 NOTE — Progress Notes (Unsigned)
Subjective  CC:  Chief Complaint  Patient presents with   Knee Injury    Pt here to f/U with abrasion on her Lt knee.    HPI: Morgan [name] is a 71 y.o. female who presents to the office today to address the problems listed above in the chief complaint. See mychart message. Pustule is worsening and now draining. Reports has h/o MRSA resistant to doxy. No fevers. Very sore now. See last note.  Assessment  1. Skin infection of left knee   2. Abrasion, knee, left, subsequent encounter   3. History of MRSA infection      Plan  wound:  wound cultures obtained. Change to Augmentin based on prior sensitivities and await culture.   Follow up: prn  01/14/2023  Orders Placed This Encounter  Procedures   Wound culture   No orders of the defined types were placed in this encounter.     I reviewed the patients updated PMH, FH, and SocHx.    Patient Active Problem List   Diagnosis Date Noted   Psoriasis 12/27/2020    Priority: High   Rheumatoid arthritis involving multiple sites with positive rheumatoid factor ([place]) 10/31/2017    Priority: High   Immunosuppressed status ([place]) 10/08/2017    Priority: High   Obesity with body mass index 30 or greater 01/25/2017    Priority: High   Current chronic use of systemic steroids 06/08/2016    Priority: High   Essential hypertension 10/20/2008    Priority: High   Mixed hyperlipidemia 04/23/2008    Priority: High   H/O partial resection of colon 01/31/2018    Priority: Medium    Leukocytosis 08/27/2014    Priority: Medium    Fatty liver disease, nonalcoholic 96/29/5284    Priority: Medium    Benign colon polyp 10/20/2008    Priority: Medium    Diverticulosis of colon 10/20/2008    Priority: Medium    Venous (peripheral) insufficiency 04/23/2008    Priority: Medium    Gastroesophageal reflux disease without esophagitis 04/23/2008    Priority: Medium    Bilateral sensorineural hearing loss 02/02/2019    Priority: Low   Uses  hearing aid 12/09/2015    Priority: Low   Thrombocytopenia ([place]) 11/23/2013    Priority: Low   Allergic rhinitis 04/23/2008    Priority: Low   Screening for osteoporosis 10/09/2021   No outpatient medications have been marked as taking for the 08/03/22 encounter (Office Visit) with [name], MD.    Allergies: Patient is allergic to remicade [infliximab], adalimumab, bee venom, golimumab, poison ivy extract, and vitamin e. Family History: Patient family history includes Arthritis in her mother; Atrial fibrillation in her mother; Breast cancer in an other family member; COPD in her mother; Diabetes in her maternal aunt, maternal uncle, and sister; Healthy in her sister; Heart disease in her mother and sister; Heart failure in her brother and son; Hyperlipidemia in her mother; Hypertension in her mother; Liver disease in her sister. Social History:  Patient  reports that she quit smoking about 20 years ago. Her smoking use included cigarettes. She has a 7.50 pack-year smoking history. She has never used smokeless tobacco. She reports current alcohol use. She reports that she does not use drugs.  Review of Systems: Constitutional: Negative for fever malaise or anorexia Cardiovascular: negative for chest pain Respiratory: negative for SOB or persistent cough Gastrointestinal: negative for abdominal pain  Objective  Vitals: Ht '5\' 4"'$  (1.626 m)   LMP  (  LMP Unknown)   BMI 34.84 kg/m  General: no acute distress , A&Ox3 See pics from recent mychart note.  Culture obtained Left knee: pustule present with small amount of pus Abrasion persists unchanged.    Commons side effects, risks, benefits, and alternatives for medications and treatment plan prescribed today were discussed, and the patient expressed understanding of the given instructions. Patient is instructed to call or message via MyChart if he/she has any questions or concerns regarding our treatment plan. No barriers to  understanding were identified. We discussed Red Flag symptoms and signs in detail. Patient expressed understanding regarding what to do in case of urgent or emergency type symptoms.  Medication list was reconciled, printed and provided to the patient in AVS. Patient instructions and summary information was reviewed with the patient as documented in the AVS. This note was prepared with assistance of Dragon voice recognition software. Occasional wrong-word or sound-a-like substitutions may have occurred due to the inherent limitations of voice recognition software  This visit occurred during the SARS-CoV-2 public health emergency.  Safety protocols were in place, including screening questions prior to the visit, additional usage of staff PPE, and extensive cleaning of exam room while observing appropriate contact time as indicated for disinfecting solutions.

## 2022-08-03 NOTE — Telephone Encounter (Signed)
Patient states: -She is experiencing "yellow pus draining from her knee"  - She sent a mychart message to provider and wanted to see what to do about knee - Knee is draining, swollen, and red currently   Due to getting disconnected with patient while on phone with triage, team health coordinator informed me she will have the access nurse call her back.

## 2022-08-06 LAB — WOUND CULTURE
MICRO NUMBER:: 13864585
SPECIMEN QUALITY:: ADEQUATE

## 2022-08-14 ENCOUNTER — Encounter: Payer: Self-pay | Admitting: Family Medicine

## 2022-08-14 MED ORDER — CLINDAMYCIN HCL 300 MG PO CAPS: 300.0000 mg | ORAL_CAPSULE | Freq: Three times a day (TID) | ORAL | 0 refills | Status: DC

## 2022-08-16 ENCOUNTER — Other Ambulatory Visit: Payer: Self-pay

## 2022-08-16 ENCOUNTER — Other Ambulatory Visit: Payer: Self-pay | Admitting: Family Medicine

## 2022-08-16 MED ORDER — CLINDAMYCIN HCL 300 MG PO CAPS: 300.0000 mg | ORAL_CAPSULE | Freq: Three times a day (TID) | ORAL | 0 refills | Status: DC

## 2022-08-17 NOTE — Telephone Encounter (Signed)
I have spoken with pt and she is aware that if the wound is not better after the first round of antibiotics and may pick up the second round for a total of 14 days. If she is not better after a full 14 days will will need to go to wound care. Pt understood.

## 2022-08-29 ENCOUNTER — Ambulatory Visit (INDEPENDENT_AMBULATORY_CARE_PROVIDER_SITE_OTHER): Payer: BC Managed Care – PPO

## 2022-08-29 DIAGNOSIS — Z23 Encounter for immunization: Secondary | ICD-10-CM

## 2022-09-04 ENCOUNTER — Ambulatory Visit: Payer: BC Managed Care – PPO | Admitting: Family Medicine

## 2022-09-04 ENCOUNTER — Ambulatory Visit: Payer: Self-pay

## 2022-09-04 ENCOUNTER — Ambulatory Visit (INDEPENDENT_AMBULATORY_CARE_PROVIDER_SITE_OTHER): Payer: BC Managed Care – PPO

## 2022-09-04 VITALS — BP 166/86 | HR 76 | Ht 64.0 in | Wt 199.0 lb

## 2022-09-04 DIAGNOSIS — G8929 Other chronic pain: Secondary | ICD-10-CM | POA: Diagnosis not present

## 2022-09-04 DIAGNOSIS — S81002D Unspecified open wound, left knee, subsequent encounter: Secondary | ICD-10-CM

## 2022-09-04 DIAGNOSIS — M25562 Pain in left knee: Secondary | ICD-10-CM | POA: Diagnosis not present

## 2022-09-04 MED ORDER — CLINDAMYCIN HCL 300 MG PO CAPS: 300.0000 mg | ORAL_CAPSULE | Freq: Three times a day (TID) | ORAL | 0 refills | Status: DC

## 2022-09-04 NOTE — Patient Instructions (Addendum)
Thank you for coming in today.   Please get an Xray today before you leave   Restart the clindamycin.   Recheck in 1 month.   If not better or concern for bone infection (osteomyelitis) we will do labs and MRI.

## 2022-09-04 NOTE — Progress Notes (Signed)
I, Morgan [name], LAT, ATC acting as a scribe for Morgan Leader, MD.  Subjective:    CC: L leg pain  HPI: Pt is a 71 y/o female c/o L leg pain. Of note, pt underwent a skin graft to her L knee wound on 07/26/20 that did not take and was seen for ongoing complications and infections. L knee wound debridement was performed on 06/09/20. Pt was previously seen by Dr. Georgina Snell 04/01/20 for L prepatellar bursitis that was incised and drained. Today, pt reports she fell on 8/19 when traveling to [place], landing on the L knee, abraining the L knee on a step. Pt notes the L knee formed 2 pustules around the end of Aug. Pt was seen by PCP for this and was prescribed doxycycline x 2 days, then switched to augmentin, and then again switched to clindamycin. Pt has pain along the area over her patella and the proximal anterior-lateral aspect of the lower leg.   Dx testing: 08/03/22 Wound culture  06/09/20 Labs  04/22/20 Culture  Pertinent review of Systems: No fevers or chills  Relevant historical information: Rheumatoid arthritis.  History of skin graft overlying left knee.   Objective:    Vitals:   09/04/22 1021  BP: (!) 166/86  Pulse: 76  SpO2: 98%   General: Well Developed, well nourished, and in no acute distress.   MSK: Left knee scar anterior knee.  Minimal erythema.  Minimal erythema.  Normal motion.  Crepitation with extension.  Intact strength.  Lab and Radiology Results  Diagnostic Limited MSK Ultrasound of: Left anterior knee No significant joint effusion present. The distal patella bone cortex is slightly irregular with some hypoechoic change superficial to this.  This could be postsurgical in nature.  Doubtful for osteomyelitis. Quad and patellar tendons are normal. Impression: Irregularity of the distal patella bone cortex.  X-ray images left knee obtained today personally and independently interpreted Mild degenerative changes.  No acute fractures. Await formal radiology  review  Component 1 mo ago  MICRO NUMBER: [id number]   SPECIMEN QUALITY: Adequate   SOURCE: LEFT KNEE PUSTULE   STATUS: FINAL   GRAM STAIN: No white blood cells seen No epithelial cells seen Many Gram positive cocci in clusters   ISOLATE 1: Staphylococcus aureus Abnormal    Comment: Heavy growth of Staphylococcus aureus  Resulting Agency [facility]     Susceptibility   Staphylococcus aureus    AEROBIC CULT, GRAM STAIN POSITIVE 1    CIPROFLOXACIN <=0.5  Sensitive    CLINDAMYCIN <=0.25  Sensitive    ERYTHROMYCIN <=0.25  Sensitive    GENTAMICIN <=0.5  Sensitive    LEVOFLOXACIN 0.25  Sensitive    OXACILLIN 0.5  Sensitive 1    TETRACYCLINE >=16  Resistant    TRIMETH/SULFA <=10  Sensitive 2    VANCOMYCIN 1  Sensitive           Impression and Recommendations:    Assessment and Plan: 71 y.o. female with left anterior knee pain after fall and abrasion.  Patient did have a positive wound culture for  methicillin-sensitive Staph aureus. This was pretty well treated with oral course of clindamycin.  She feels as though the infection may be returning but she is not sure.  She is very concerned about potential for infections given her past history.  There is nothing to culture today but her husband does report some areas where he can express occasional pustules that sound most like a pimple.  I sent him home  with a culture swab and instructions on how to obtain a good sample in case there is any pus to culture in the future.  In the meantime we will refill the clindamycin and plan on checking back in about a month.  If needed we could MRI of the knee to further evaluate it more accurately.  I am doubtful for osteomyelitis but that is a possibility based on her history and an MRI would give [place] a good chance to see the potential for osteomyelitis.  PDMP not reviewed this encounter. Orders Placed This Encounter  Procedures   [place] LIMITED JOINT SPACE STRUCTURES LOW LEFT(NO LINKED  CHARGES)    Order Specific Question:   Reason for Exam (SYMPTOM  OR DIAGNOSIS REQUIRED)    Answer:   eval knee pain    Order Specific Question:   Preferred imaging location?    Answer:   [facility]   DG Knee AP/LAT W/Sunrise Left    Standing Status:   Future    Number of Occurrences:   1    Standing Expiration Date:   10/05/2022    Order Specific Question:   Reason for Exam (SYMPTOM  OR DIAGNOSIS REQUIRED)    Answer:   left knee pain    Order Specific Question:   Preferred imaging location?    Answer:   [facility]   Meds ordered this encounter  Medications   clindamycin (CLEOCIN) 300 MG capsule    Sig: Take 1 capsule (300 mg total) by mouth 3 (three) times daily.    Dispense:  21 capsule    Refill:  0    Discussed warning signs or symptoms. Please see discharge instructions. Patient expresses understanding.   The above documentation has been reviewed and is accurate and complete Morgan [name], M.D.

## 2022-09-05 ENCOUNTER — Other Ambulatory Visit: Payer: Self-pay

## 2022-09-05 DIAGNOSIS — S81002D Unspecified open wound, left knee, subsequent encounter: Secondary | ICD-10-CM | POA: Diagnosis not present

## 2022-09-05 DIAGNOSIS — G8929 Other chronic pain: Secondary | ICD-10-CM

## 2022-09-06 NOTE — Progress Notes (Signed)
Left knee x-ray shows some mild arthritis changes.

## 2022-09-08 LAB — WOUND CULTURE
MICRO NUMBER:: 14006965
SPECIMEN QUALITY:: ADEQUATE

## 2022-09-10 NOTE — Progress Notes (Signed)
Wound culture is negative.  It grew normal skin bacteria but nothing that we worry about or is pathological.

## 2022-09-14 ENCOUNTER — Encounter: Payer: Self-pay | Admitting: Family Medicine

## 2022-09-21 DIAGNOSIS — H18593 Other hereditary corneal dystrophies, bilateral: Secondary | ICD-10-CM | POA: Diagnosis not present

## 2022-09-28 DIAGNOSIS — L4050 Arthropathic psoriasis, unspecified: Secondary | ICD-10-CM | POA: Diagnosis not present

## 2022-09-28 DIAGNOSIS — R5383 Other fatigue: Secondary | ICD-10-CM | POA: Diagnosis not present

## 2022-09-28 DIAGNOSIS — L409 Psoriasis, unspecified: Secondary | ICD-10-CM | POA: Diagnosis not present

## 2022-09-28 DIAGNOSIS — M1991 Primary osteoarthritis, unspecified site: Secondary | ICD-10-CM | POA: Diagnosis not present

## 2022-09-28 DIAGNOSIS — Z79899 Other long term (current) drug therapy: Secondary | ICD-10-CM | POA: Diagnosis not present

## 2022-10-01 ENCOUNTER — Ambulatory Visit: Payer: BC Managed Care – PPO | Admitting: Family Medicine

## 2022-10-01 ENCOUNTER — Encounter: Payer: Self-pay | Admitting: Family Medicine

## 2022-10-01 ENCOUNTER — Ambulatory Visit (INDEPENDENT_AMBULATORY_CARE_PROVIDER_SITE_OTHER): Payer: BC Managed Care – PPO

## 2022-10-01 ENCOUNTER — Telehealth: Payer: Self-pay | Admitting: Family Medicine

## 2022-10-01 VITALS — BP 148/72 | HR 85 | Ht 64.0 in | Wt 203.0 lb

## 2022-10-01 DIAGNOSIS — M25511 Pain in right shoulder: Secondary | ICD-10-CM

## 2022-10-01 DIAGNOSIS — R0781 Pleurodynia: Secondary | ICD-10-CM

## 2022-10-01 MED ORDER — TIZANIDINE HCL 2 MG PO TABS: 2.0000 mg | ORAL_TABLET | Freq: Three times a day (TID) | ORAL | 1 refills | Status: DC | PRN

## 2022-10-01 NOTE — Patient Instructions (Signed)
Thank you for coming in today.   Please get an Xray today before you leave   Use the incentive spirometer.   Use the muscle relaxer at bedtime.   Ok to be active if you want.   Recheck in about 2 weeks.

## 2022-10-01 NOTE — Telephone Encounter (Signed)
Patient states she fell flat on her chest and face on the grass in her yard on 09/29/22.  Patient states since falling she has been experiencing pain in her chest that goes across both ribs.  Patient states she has pain all over her body.  Transferred to Triage Paulla Dolly).

## 2022-10-01 NOTE — Progress Notes (Signed)
I, Peterson Lombard, LAT, ATC acting as a scribe for [name], MD.  Morgan [name] is a 71 y.o. female who presents to [place] at [facility] today for rib pain ongoing since Saturday, 10/28, after suffering a fall. Pt was previously seen by Dr. Georgina Snell on 09/04/22 for chronic L knee pain. Today, pt reports she fell after watering some plants and got her foot caught up in the hose. Pt fell forward. Pt locates pain to R-side of her rib cage, also throughout chest and bilat shoulders.   The pain is worse in her right inferior lateral rib.  Her right shoulder is a little painful as well.  She has a history of a right total shoulder replacement and would like that checked out to be confident that has not been hurting her more injured.  Aggravates: trunk rotation, deep inspiration Treatments tried: IBU, Tylneol   Pertinent review of systems: No fevers or chills  Relevant historical information: Rheumatoid arthritis.  Right shoulder replacement.   Exam:  BP (!) 148/72   Pulse 85   Ht '5\' 4"'$  (1.626 m)   Wt 203 lb (92.1 kg)   LMP  (LMP Unknown)   SpO2 98%   BMI 34.84 kg/m  General: Well Developed, well nourished, and in no acute distress.   MSK: Right ribs: Tender palpation right inferior lateral rib favoring anterior. Pain with deep inspiration is present.   Right shoulder normal appearing normal shoulder motion and strength.  Mildly tender palpation lateral upper arm.    Lab and Radiology Results  X-ray images right shoulder and right ribs obtained today personally and independently interpreted  Right shoulder: Reverse total shoulder replacement hardware appears to be intact without fracture.  Right ribs: Fracture present at right inferior rib.  Fracture appears to be nondisplaced.  No pneumothorax.     Assessment and Plan: 71 y.o. female with right inferior lateral rib fracture.  Plan for rib belt.  Prescribed tizanidine and recommend heating pad.   Recommend recheck in 2 weeks.  Right shoulder pain doubtful for fracture or injury to the surgical hardware from a right reverse total shoulder replacement.   PDMP not reviewed this encounter. Orders Placed This Encounter  Procedures   DG Ribs Unilateral W/Chest Right    Standing Status:   Future    Number of Occurrences:   1    Standing Expiration Date:   10/02/2023    Order Specific Question:   Reason for Exam (SYMPTOM  OR DIAGNOSIS REQUIRED)    Answer:   eval rib pain r    Order Specific Question:   Preferred imaging location?    Answer:   [facility]   DG Shoulder Right    Standing Status:   Future    Number of Occurrences:   1    Standing Expiration Date:   10/02/2023    Order Specific Question:   Reason for Exam (SYMPTOM  OR DIAGNOSIS REQUIRED)    Answer:   eval shoulder pain r    Order Specific Question:   Preferred imaging location?    Answer:   [facility]   Meds ordered this encounter  Medications   tiZANidine (ZANAFLEX) 2 MG tablet    Sig: Take 1-2 tablets (2-4 mg total) by mouth every 8 (eight) hours as needed.    Dispense:  60 tablet    Refill:  1     Discussed warning signs or symptoms. Please see discharge instructions. Patient expresses  understanding.   The above documentation has been reviewed and is accurate and complete [name], M.D.

## 2022-10-01 NOTE — Telephone Encounter (Signed)
Patient Name: Morgan [name] Gender: Female DOB: 1951/12/03 Age: 71 Y 24 M 18 D Return Phone Number: [phone] (Primary), [phone] (Secondary) Address: City/ State/ Zip: [place]  [address] Client [place] [facility] [place] Client Site [place] [facility] [place] Day Provider Billey Chang- MD Contact Type Call Who Is Calling Patient / Member / Family / Caregiver Call Type Triage / Clinical Relationship To Patient Self Return Phone Number [phone] (Primary) Chief Complaint CHEST PAIN - pain, pressure, heaviness or tightness Reason for Call Symptomatic / Request for Iselin says that she fell flat on her chest and face 2 days ago, and since then she has had pain on her chest across both sides of her ribs, and has pain all over her body Translation No Nurse Assessment Nurse: Zorita Pang, RN, Neoma Laming Date/Time ([place] Time): 10/01/2022 11:01:40 AM Confirm and document reason for call. If symptomatic, describe symptoms. ---The caller states that she fell two days ago and fell chest and face down. Was hurting all over. Now having chest pain and rib pain. Hurts more when she takes a deep breath > on the right. No history of heart disease. Chest pain is worse under her right breast. Does the patient have any new or worsening symptoms? ---Yes Will a triage be completed? ---Yes Related visit to physician within the last 2 weeks? ---No Does the PT have any chronic conditions? (i.e. diabetes, asthma, this includes High risk factors for pregnancy, etc.) ---Yes List chronic conditions. ---rheumatoid arthritis Is this a behavioral health or substance abuse call? ---No Guidelines Guideline Title Affirmed Question Affirmed Notes Nurse Date/Time (Eastern Time) Chest Pain [1] Chest pain lasts > 5 minutes AND [2] age > 24 AND [3] one or more Womble, RN, Neoma Laming 10/01/2022  11:03:54 AM  Guidelines Guideline Title Affirmed Question Affirmed Notes Nurse Date/Time Eilene Ghazi Time) cardiac risk factors (e.g., diabetes, high blood pressure, high cholesterol, smoker, or strong family history of heart disease) Disp. Time Eilene Ghazi Time) Disposition Final User 10/01/2022 10:58:15 AM Send to Urgent Micheal Likens 10/01/2022 11:07:37 AM Call EMS 911 Now Yes Zorita Pang, RN, Neoma Laming 10/01/2022 11:09:09 AM 911 Outcome Documentation Womble, RN, Neoma Laming Reason: Caller declined calling 911 but was willing to go to the ED for evaluation. She was instructed to pull over and call 911 if she worsens along the way and she verbalzied understnading. Final Disposition 10/01/2022 11:07:37 AM Call EMS 911 Now Yes Womble, RN, Garrel Ridgel Disagree/Comply Disagree Caller Understands Yes PreDisposition Call Doctor Care Advice Given Per Guideline CALL EMS 911 NOW: Referrals [place] - ED

## 2022-10-03 ENCOUNTER — Encounter: Payer: Self-pay | Admitting: Oncology

## 2022-10-04 NOTE — Progress Notes (Signed)
Right shoulder x-ray shows no fracture or dislocation.  The shoulder replacement hardware looks good.

## 2022-10-04 NOTE — Progress Notes (Signed)
Right rib x-ray showed no fracture.  I thought I saw a fracture when I looked myself.  Either way it will be bruised or broken rib which is can hurt for a few weeks.

## 2022-10-09 ENCOUNTER — Ambulatory Visit: Payer: BC Managed Care – PPO | Admitting: Family Medicine

## 2022-10-11 ENCOUNTER — Encounter: Payer: Self-pay | Admitting: Family Medicine

## 2022-10-15 ENCOUNTER — Ambulatory Visit: Payer: BC Managed Care – PPO | Admitting: Family Medicine

## 2022-10-15 ENCOUNTER — Other Ambulatory Visit: Payer: BC Managed Care – PPO

## 2022-10-15 ENCOUNTER — Other Ambulatory Visit: Payer: Self-pay | Admitting: Family Medicine

## 2022-10-15 ENCOUNTER — Ambulatory Visit (INDEPENDENT_AMBULATORY_CARE_PROVIDER_SITE_OTHER): Payer: BC Managed Care – PPO

## 2022-10-15 VITALS — BP 168/82 | HR 69 | Ht 64.0 in | Wt 203.0 lb

## 2022-10-15 DIAGNOSIS — S81001A Unspecified open wound, right knee, initial encounter: Secondary | ICD-10-CM

## 2022-10-15 DIAGNOSIS — D696 Thrombocytopenia, unspecified: Secondary | ICD-10-CM | POA: Diagnosis not present

## 2022-10-15 DIAGNOSIS — S81002D Unspecified open wound, left knee, subsequent encounter: Secondary | ICD-10-CM

## 2022-10-15 DIAGNOSIS — M25862 Other specified joint disorders, left knee: Secondary | ICD-10-CM | POA: Diagnosis not present

## 2022-10-15 DIAGNOSIS — D849 Immunodeficiency, unspecified: Secondary | ICD-10-CM

## 2022-10-15 DIAGNOSIS — D72829 Elevated white blood cell count, unspecified: Secondary | ICD-10-CM | POA: Diagnosis not present

## 2022-10-15 DIAGNOSIS — D72821 Monocytosis (symptomatic): Secondary | ICD-10-CM | POA: Diagnosis not present

## 2022-10-15 LAB — C-REACTIVE PROTEIN: CRP: <1 mg/dL (ref 0.5–20.0)

## 2022-10-15 LAB — CBC WITH DIFFERENTIAL/PLATELET
Basophils Absolute: 0 10*3/uL (ref 0.0–0.1)
Basophils Relative: 0.3 % (ref 0.0–3.0)
Eosinophils Absolute: 0.1 10*3/uL (ref 0.0–0.7)
Eosinophils Relative: 0.5 % (ref 0.0–5.0)
HCT: 38.4 % (ref 36.0–46.0)
Hemoglobin: 12.6 g/dL (ref 12.0–15.0)
Lymphocytes Relative: 10.7 % — ABNORMAL LOW (ref 12.0–46.0)
Lymphs Abs: 1.5 10*3/uL (ref 0.7–4.0)
MCHC: 32.8 g/dL (ref 30.0–36.0)
MCV: 96.5 fl (ref 78.0–100.0)
Monocytes Absolute: 3.9 10*3/uL — ABNORMAL HIGH (ref 0.1–1.0)
Monocytes Relative: 27 % — ABNORMAL HIGH (ref 3.0–12.0)
Neutro Abs: 8.8 10*3/uL — ABNORMAL HIGH (ref 1.4–7.7)
Neutrophils Relative %: 61.5 % (ref 43.0–77.0)
Platelets: 85 10*3/uL — ABNORMAL LOW (ref 150.0–400.0)
RBC: 3.98 Mil/uL (ref 3.87–5.11)
RDW: 16.5 % — ABNORMAL HIGH (ref 11.5–15.5)
WBC: 14.4 10*3/uL — ABNORMAL HIGH (ref 4.0–10.5)

## 2022-10-15 LAB — SEDIMENTATION RATE: Sed Rate: 15 mm/hr (ref 0–30)

## 2022-10-15 NOTE — Patient Instructions (Signed)
Thank you for coming in today.   Please get an Xray today before you leave   Please get labs today before you leave   Continue self care for your ribs.   I am worried about the possibility of a bacterial infection of the kneecap (osteomyelitis of the patella)  I may ask for help from infectious disease and possibly orthopedics.

## 2022-10-15 NOTE — Progress Notes (Signed)
I, Morgan [name], LAT, ATC acting as a scribe for [name], MD.  Morgan [name] is a 71 y.o. female who presents to [place] at [facility] today for follow-up rib fracture.  Patient was seen October 30 for concern for right inferior lateral rib contusion and possible fracture..  At that time she was recommended she try conservative management with a rib belt heating pad tizanidine and over-the-counter medications.  Additionally she had some right shoulder pain at the site of a prior reverse total shoulder replacement.  Fortunately that x-ray did not show fracture or injury to the hardware. Today, pt reports rib pain has improved to just a "soreness" type of pain. Pt feels like her knee pain is also improving. Pt has an area of concern of the anterior aspect of the L knee that will expel some "puss" every other day.   This pus at her left knee has been ongoing chronically over the last 6 to 8 weeks at least.  She has a history of a large knee injury and abrasion.  She was seen by her rheumatologist and found to have elevated white blood cell count in the 20s about 2 weeks ago.  She does take steroids chronically and was advised to have her labs rechecked in the near future.  She feels well with no fevers or chills.    Pertinent review of systems: No fevers or chills.  Positive for recurrent pustule left anterior knee  Relevant historical information: Rheumatoid arthritis on chronic low-dose steroids.   Exam:  BP (!) 168/82   Pulse 69   Ht '5\' 4"'$  (1.626 m)   Wt 203 lb (92.1 kg)   LMP  (LMP Unknown)   SpO2 98%   BMI 34.84 kg/m  General: Well Developed, well nourished, and in no acute distress.   MSK: Left anterior knee mature scar formed with small papule anterior knee.  No expressible pus.  Nontender.  Normal knee motion.  No erythema or induration.    Lab and Radiology Results  X-ray images left knee obtained today personally and independently  interpreted Mild degenerative changes.  No significant change in appearance of patella from x-ray dated September 04, 2022. Await formal radiology review     Assessment and Plan: 71 y.o. female with left anterior knee pustule.  This is a recurrent chronic problem now.  I am concerned that she has a chronic smoldering bacterial infection of the soft tissue of the anterior knee and at worst possibly a smoldering osteomyelitis of the patella.  She reportedly had significant leukocytosis with her rheumatologist recently.  We will go ahead and recheck these labs along with sed rate and CRP to evaluate for possible osteomyelitis.  I think it is likely that we may be getting an MRI of the knee to evaluate for osteomyelitis and also asking infectious disease for help.  She is on chronic steroids and immunocompromised with Cosentyx and prednisone.  Therefore typical lab evaluation for osteomyelitis may be less effective.  As for the bruised ribs she is doing better clinically.  No need for repeat x-ray at this time.   PDMP not reviewed this encounter. Orders Placed This Encounter  Procedures   DG Knee AP/LAT W/Sunrise Left    Standing Status:   Future    Number of Occurrences:   1    Standing Expiration Date:   10/16/2023    Order Specific Question:   Reason for Exam (SYMPTOM  OR DIAGNOSIS REQUIRED)  Answer:   eval anterior inection? Osteomyelitis possitble patella    Order Specific Question:   Preferred imaging location?    Answer:   [facility]   CBC w/Diff   Sedimentation rate   C-reactive protein   No orders of the defined types were placed in this encounter.    Discussed warning signs or symptoms. Please see discharge instructions. Patient expresses understanding.   The above documentation has been reviewed and is accurate and complete [name], M.D.

## 2022-10-16 LAB — PATHOLOGIST SMEAR REVIEW

## 2022-10-16 NOTE — Progress Notes (Signed)
White blood cell is at your baseline. The sed rate and CRP is normal. I would suggest we give your knee a a few more weeks. If not getting better with chlorhexidine body wash we should move to an MRI and potential infectious disease referral.

## 2022-10-17 NOTE — Progress Notes (Signed)
Left knee x-ray shows some mild arthritis changes.

## 2022-10-18 NOTE — Progress Notes (Signed)
I talked to your oncologist about this lab.  He thinks this is at your baseline or and does not need further work-up. Let me know how that goes.

## 2022-10-19 ENCOUNTER — Other Ambulatory Visit (HOSPITAL_BASED_OUTPATIENT_CLINIC_OR_DEPARTMENT_OTHER): Payer: Self-pay

## 2022-10-19 MED ORDER — COMIRNATY 30 MCG/0.3ML IM SUSY
PREFILLED_SYRINGE | INTRAMUSCULAR | 0 refills | Status: DC
  Filled 2022-10-19: qty 0.3, 1d supply, fill #0

## 2022-10-21 ENCOUNTER — Other Ambulatory Visit: Payer: Self-pay | Admitting: Family Medicine

## 2022-10-21 DIAGNOSIS — B9689 Other specified bacterial agents as the cause of diseases classified elsewhere: Secondary | ICD-10-CM

## 2022-10-21 DIAGNOSIS — U071 COVID-19: Secondary | ICD-10-CM

## 2022-10-22 ENCOUNTER — Encounter: Payer: Self-pay | Admitting: Family Medicine

## 2022-10-22 ENCOUNTER — Ambulatory Visit: Payer: BC Managed Care – PPO | Admitting: Family Medicine

## 2022-10-22 VITALS — BP 140/60 | HR 73 | Temp 98.7°F | Ht 64.0 in | Wt 202.4 lb

## 2022-10-22 DIAGNOSIS — J0100 Acute maxillary sinusitis, unspecified: Secondary | ICD-10-CM | POA: Diagnosis not present

## 2022-10-22 LAB — POC COVID19 BINAXNOW: SARS Coronavirus 2 Ag: NEGATIVE

## 2022-10-22 MED ORDER — AMOXICILLIN-POT CLAVULANATE 875-125 MG PO TABS: 1.0000 | ORAL_TABLET | Freq: Two times a day (BID) | ORAL | 0 refills | Status: AC

## 2022-10-22 NOTE — Patient Instructions (Signed)
Please follow up as scheduled for your next visit with me: 01/14/2023   If you have any questions or concerns, please don't hesitate to send me a message via MyChart or call the office at [phone]. Thank you for visiting with [place] today! It's our pleasure caring for you.   Hope you feel better quickly; I have refilled the flonase, tessalon perles and added augmenting antibiotic twice a day for 10 days.

## 2022-10-22 NOTE — Progress Notes (Signed)
Subjective   CC:  Chief Complaint  Patient presents with   Cough    Pt stated that she has been dealing with a cold since 10/18/2022 along with headache, sore throat     HPI: Morgan [name] is a 71 y.o. female who presents to the office today to address the problems listed above in the chief complaint. Patient reports sinus congestion and pressure with thick drainage, mild nonproductive cough, ear pressure without pain, and mild malaise.  Symptoms have been present for several days.Shedenies high fevers, GI symptoms, shortness of breath. Shehas had sinus infections in the past and this feels similar.  She has facial and dental pain. Also with cough relieved by tessalon perles and a headache. Patient is a non-smoker.  No history of asthma or COPD.  I reviewed the patients updated PMH, FH, and SocHx.    Patient Active Problem List   Diagnosis Date Noted   Psoriasis 12/27/2020    Priority: High   Rheumatoid arthritis involving multiple sites with positive rheumatoid factor ([place]) 10/31/2017    Priority: High   Immunosuppressed status ([place]) 10/08/2017    Priority: High   Obesity with body mass index 30 or greater 01/25/2017    Priority: High   Current chronic use of systemic steroids 06/08/2016    Priority: High   Essential hypertension 10/20/2008    Priority: High   Mixed hyperlipidemia 04/23/2008    Priority: High   H/O partial resection of colon 01/31/2018    Priority: Medium    Leukocytosis 08/27/2014    Priority: Medium    Fatty liver disease, nonalcoholic 09/98/3382    Priority: Medium    Benign colon polyp 10/20/2008    Priority: Medium    Diverticulosis of colon 10/20/2008    Priority: Medium    Venous (peripheral) insufficiency 04/23/2008    Priority: Medium    Gastroesophageal reflux disease without esophagitis 04/23/2008    Priority: Medium    Bilateral sensorineural hearing loss 02/02/2019    Priority: Low   Uses hearing aid 12/09/2015    Priority: Low    Thrombocytopenia ([place]) 11/23/2013    Priority: Low   Allergic rhinitis 04/23/2008    Priority: Low   Screening for osteoporosis 10/09/2021   Current Meds  Medication Sig   acetaminophen (TYLENOL) 500 MG tablet Take 1,000 mg by mouth every 6 (six) hours as needed for moderate pain or headache.    aspirin EC 81 MG tablet Take 81 mg by mouth every Monday, Wednesday, and Friday. Swallow whole.   Calcium Carbonate-Vitamin D 600-400 MG-UNIT tablet Take 1 tablet by mouth 2 (two) times daily.    Cholecalciferol (VITAMIN D) 50 MCG (2000 UT) CAPS Take 2,000 Units by mouth daily.    COVID-19 mRNA vaccine 2023-2024 (COMIRNATY) syringe Inject into the muscle.   doxepin (SINEQUAN) 10 MG capsule Take 10 mg by mouth 2 (two) times daily.   fenofibrate 160 MG tablet TAKE 1 TABLET BY MOUTH AT BEDTIME.   fexofenadine (ALLEGRA) 180 MG tablet Take 180 mg by mouth daily.   fluticasone (FLONASE) 50 MCG/ACT nasal spray USE 2 SPRAYS IN EACH NOSTRIL DAILY AS NEEDED FOR ALLERGIES   furosemide (LASIX) 20 MG tablet Take 1 tablet (20 mg total) by mouth daily as needed for edema. Take with potassium   lisinopril (ZESTRIL) 10 MG tablet TAKE 1 TABLET BY MOUTH EVERY DAY   Multiple Vitamin (MULTIVITAMIN) tablet Take 1 tablet by mouth daily.   polyethylene glycol (MIRALAX) 17 g packet Take  17 g by mouth daily.   potassium chloride SA (KLOR-CON) 20 MEQ tablet Take 1 tablet (20 mEq total) by mouth daily as needed (leg swelling). Take with furosemide   predniSONE (DELTASONE) 5 MG tablet Take 5 mg by mouth daily.   simvastatin (ZOCOR) 40 MG tablet TAKE 1 TABLET BY MOUTH EVERYDAY AT BEDTIME   tiZANidine (ZANAFLEX) 2 MG tablet Take 1-2 tablets (2-4 mg total) by mouth every 8 (eight) hours as needed.   Urea 47 % CREA Apply 1 application topically daily as needed (irritation).    valACYclovir (VALTREX) 500 MG tablet Take 500 mg by mouth 2 (two) times daily.    Review of Systems: Cardiovascular: negative for chest  pain Respiratory: negative for SOB or persistent cough Gastrointestinal: negative for abdominal pain Genitourinary: negative for dysuria or gross hematuria  Objective  Vitals: BP (!) 140/60   Pulse 73   Temp 98.7 F (37.1 C)   Ht '5\' 4"'$  (1.626 m)   Wt 202 lb 6.4 oz (91.8 kg)   LMP  (LMP Unknown)   SpO2 99%   BMI 34.74 kg/m  General: no acute distress  Psych:  Alert and oriented, normal mood and affect HEENT:  Normocephalic, atraumatic, TMs with serous effusions or retraction w/o erythema, nasal mucosa is red with purulent drainage, tender maxillary sinus present, OP mild erythematous w/o eudate, supple neck without LAD Cardiovascular:  RRR without murmur or gallop. no peripheral edema Respiratory:  Good breath sounds bilaterally, CTAB with normal respiratory effort Skin:  Warm, no rashes Neurologic:   Mental status is normal. normal gait  Covid negative in office today Assessment  1. Acute cough      Plan   Sinusitis: History and exam is most consistent with bacterial sinus infection.  Etiology and prognosis discussed with patient.  Recommend antibiotics as ordered below.  Patient to complete course of antibiotics, use supportive medications like mucolytics and decongestants as needed.  May use Tylenol or Advil if needed.  Symptoms should improve over the next 2 weeks.  Patient will return or call if symptoms persist or worsen.  Follow up: as scheduled.   Commons side effects, risks, benefits, and alternatives for medications and treatment plan prescribed today were discussed, and the patient expressed understanding of the given instructions. Patient is instructed to call or message via MyChart if he/she has any questions or concerns regarding our treatment plan. No barriers to understanding were identified. We discussed Red Flag symptoms and signs in detail. Patient expressed understanding regarding what to do in case of urgent or emergency type symptoms.  Medication list was  reconciled, printed and provided to the patient in AVS. Patient instructions and summary information was reviewed with the patient as documented in the AVS. This note was prepared with assistance of Dragon voice recognition software. Occasional wrong-word or sound-a-like substitutions may have occurred due to the inherent limitations of voice recognition software  Orders Placed This Encounter  Procedures   POC COVID-19   No orders of the defined types were placed in this encounter.

## 2022-11-05 ENCOUNTER — Other Ambulatory Visit: Payer: Self-pay | Admitting: Family Medicine

## 2022-11-05 DIAGNOSIS — Z1231 Encounter for screening mammogram for malignant neoplasm of breast: Secondary | ICD-10-CM

## 2022-11-06 ENCOUNTER — Other Ambulatory Visit (HOSPITAL_BASED_OUTPATIENT_CLINIC_OR_DEPARTMENT_OTHER): Payer: Self-pay

## 2022-11-06 MED ORDER — AREXVY 120 MCG/0.5ML IM SUSR
INTRAMUSCULAR | 0 refills | Status: DC
  Filled 2022-11-06: qty 0.5, 1d supply, fill #0

## 2022-12-06 ENCOUNTER — Other Ambulatory Visit: Payer: Self-pay | Admitting: Family Medicine

## 2022-12-11 ENCOUNTER — Other Ambulatory Visit (HOSPITAL_COMMUNITY): Payer: Self-pay

## 2023-01-01 ENCOUNTER — Ambulatory Visit
Admission: RE | Admit: 2023-01-01 | Discharge: 2023-01-01 | Disposition: A | Discharge status: Home / Self Care | Payer: BC Managed Care – PPO | Source: Ambulatory Visit | Attending: Family Medicine | Admitting: Family Medicine

## 2023-01-01 DIAGNOSIS — Z1231 Encounter for screening mammogram for malignant neoplasm of breast: Secondary | ICD-10-CM | POA: Diagnosis not present

## 2023-01-14 ENCOUNTER — Encounter: Payer: BC Managed Care – PPO | Admitting: Family Medicine

## 2023-01-15 ENCOUNTER — Ambulatory Visit (INDEPENDENT_AMBULATORY_CARE_PROVIDER_SITE_OTHER): Payer: BC Managed Care – PPO | Admitting: Family Medicine

## 2023-01-15 ENCOUNTER — Encounter: Payer: Self-pay | Admitting: Family Medicine

## 2023-01-15 VITALS — BP 138/70 | HR 79 | Temp 97.9°F | Ht 64.0 in | Wt 199.6 lb

## 2023-01-15 DIAGNOSIS — Z0001 Encounter for general adult medical examination with abnormal findings: Secondary | ICD-10-CM | POA: Diagnosis not present

## 2023-01-15 DIAGNOSIS — M0579 Rheumatoid arthritis with rheumatoid factor of multiple sites without organ or systems involvement: Secondary | ICD-10-CM

## 2023-01-15 DIAGNOSIS — Z7952 Long term (current) use of systemic steroids: Secondary | ICD-10-CM

## 2023-01-15 DIAGNOSIS — Z0000 Encounter for general adult medical examination without abnormal findings: Secondary | ICD-10-CM

## 2023-01-15 DIAGNOSIS — I10 Essential (primary) hypertension: Secondary | ICD-10-CM | POA: Diagnosis not present

## 2023-01-15 DIAGNOSIS — R519 Headache, unspecified: Secondary | ICD-10-CM

## 2023-01-15 DIAGNOSIS — D696 Thrombocytopenia, unspecified: Secondary | ICD-10-CM

## 2023-01-15 DIAGNOSIS — D849 Immunodeficiency, unspecified: Secondary | ICD-10-CM | POA: Diagnosis not present

## 2023-01-15 DIAGNOSIS — E782 Mixed hyperlipidemia: Secondary | ICD-10-CM | POA: Diagnosis not present

## 2023-01-15 LAB — CBC WITH DIFFERENTIAL/PLATELET
Basophils Absolute: 0.1 10*3/uL (ref 0.0–0.1)
Basophils Relative: 0.6 % (ref 0.0–3.0)
Eosinophils Absolute: 0.1 10*3/uL (ref 0.0–0.7)
Eosinophils Relative: 1 % (ref 0.0–5.0)
HCT: 39.4 % (ref 36.0–46.0)
Hemoglobin: 13.3 g/dL (ref 12.0–15.0)
Lymphocytes Relative: 11.4 % — ABNORMAL LOW (ref 12.0–46.0)
Lymphs Abs: 1.5 10*3/uL (ref 0.7–4.0)
MCHC: 33.7 g/dL (ref 30.0–36.0)
MCV: 93 fl (ref 78.0–100.0)
Monocytes Absolute: 3.5 10*3/uL — ABNORMAL HIGH (ref 0.1–1.0)
Monocytes Relative: 26.3 % — ABNORMAL HIGH (ref 3.0–12.0)
Neutro Abs: 8 10*3/uL — ABNORMAL HIGH (ref 1.4–7.7)
Neutrophils Relative %: 60.7 % (ref 43.0–77.0)
Platelets: 96 10*3/uL — ABNORMAL LOW (ref 150.0–400.0)
RBC: 4.24 Mil/uL (ref 3.87–5.11)
RDW: 15.8 % — ABNORMAL HIGH (ref 11.5–15.5)
WBC: 13.1 10*3/uL — ABNORMAL HIGH (ref 4.0–10.5)

## 2023-01-15 LAB — COMPREHENSIVE METABOLIC PANEL
ALT: 21 U/L (ref 0–35)
AST: 30 U/L (ref 0–37)
Albumin: 4.2 g/dL (ref 3.5–5.2)
Alkaline Phosphatase: 56 U/L (ref 39–117)
BUN: 24 mg/dL — ABNORMAL HIGH (ref 6–23)
CO2: 27 mEq/L (ref 19–32)
Calcium: 10.4 mg/dL (ref 8.4–10.5)
Chloride: 103 mEq/L (ref 96–112)
Creatinine, Ser: 0.9 mg/dL (ref 0.40–1.20)
GFR: 63.31 mL/min (ref >60.00)
Glucose, Bld: 101 mg/dL — ABNORMAL HIGH (ref 70–99)
Potassium: 4.4 mEq/L (ref 3.5–5.1)
Sodium: 138 mEq/L (ref 135–145)
Total Bilirubin: 0.4 mg/dL (ref 0.2–1.2)
Total Protein: 7.1 g/dL (ref 6.0–8.3)

## 2023-01-15 LAB — LIPID PANEL
Cholesterol: 144 mg/dL (ref 0–200)
HDL: 53.1 mg/dL (ref >39.00)
LDL Cholesterol: 74 mg/dL (ref 0–99)
NonHDL: 90.88
Total CHOL/HDL Ratio: 3
Triglycerides: 83 mg/dL (ref 0.0–149.0)
VLDL: 16.6 mg/dL (ref 0.0–40.0)

## 2023-01-15 LAB — TSH: TSH: 2.75 u[IU]/mL (ref 0.35–5.50)

## 2023-01-15 NOTE — Patient Instructions (Signed)
Please return in 6 months for hypertension follow up.   I will release your lab results to you on your MyChart account with further instructions. You may see the results before I do, but when I review them I will send you a message with my report or have my assistant call you if things need to be discussed. Please reply to my message with any questions. Thank you!   Please monitor your headaches and let me know if they persist. For a severe or sudden headache, go to the ER for evaluation.   If you have any questions or concerns, please don't hesitate to send me a message via MyChart or call the office at [phone]. Thank you for visiting with [place] today! It's our pleasure caring for you.   General Headache Without Cause A headache is pain or discomfort felt around the head or neck area. There are many causes and types of headaches. A few common types include: Tension headaches. Migraine headaches. Cluster headaches. Chronic daily headaches. Sometimes, the specific cause of a headache may not be found. Follow these instructions at home: Watch your condition for any changes. Let your health care provider know about them. Take these steps to help with your condition: Managing pain     Take over-the-counter and prescription medicines only as told by your health care provider. Treatment may include medicines for pain that are taken by mouth or applied to the skin. Lie down in a dark, quiet room when you have a headache. Keep lights dim if bright lights bother you or make your headaches worse. If directed, put ice on your head and neck area: Put ice in a plastic bag. Place a towel between your skin and the bag. Leave the ice on for 20 minutes, 2-3 times per day. Remove the ice if your skin turns bright red. This is very important. If you cannot feel pain, heat, or cold, you have a greater risk of damage to the area. If directed, apply heat to the affected area. Use the heat source that your  health care provider recommends, such as a moist heat pack or a heating pad. Place a towel between your skin and the heat source. Leave the heat on for 20-30 minutes. Remove the heat if your skin turns bright red. This is especially important if you are unable to feel pain, heat, or cold. You have a greater risk of getting burned. Eating and drinking Eat meals on a regular schedule. If you drink alcohol: Limit how much you have to: 0-1 drink a day for women who are not pregnant. 0-2 drinks a day for men. Know how much alcohol is in a drink. In the [place] one drink equals one 12 oz bottle of beer (355 mL), one 5 oz glass of wine (148 mL), or one 1 oz glass of hard liquor (44 mL). Stop drinking caffeine, or decrease the amount of caffeine you drink. Drink enough fluid to keep your urine pale yellow. General instructions  Keep a headache journal to help find out what may trigger your headaches. For example, write down: What you eat and drink. How much sleep you get. Any change to your diet or medicines. Try massage or other relaxation techniques. Limit stress. Sit up straight, and do not tense your muscles. Do not use any products that contain nicotine or tobacco. These products include cigarettes, chewing tobacco, and vaping devices, such as e-cigarettes. If you need help quitting, ask your health care provider. Exercise regularly as  told by your health care provider. Sleep on a regular schedule. Get 7-9 hours of sleep each night, or the amount recommended by your health care provider. Keep all follow-up visits. This is important. Contact a health care provider if: Medicine does not help your symptoms. You have a headache that is different from your usual headache. You have nausea or you vomit. You have a fever. Get help right away if: Your headache: Becomes severe quickly. Gets worse after moderate to intense physical activity. You have any of these symptoms: Repeated  vomiting. Pain or stiffness in your neck. Changes to your vision. Pain in an eye or ear. Problems with speech. Muscular weakness or loss of muscle control. Loss of balance or coordination. You feel faint or pass out. You have confusion. You have a seizure. These symptoms may represent a serious problem that is an emergency. Do not wait to see if the symptoms will go away. Get medical help right away. Call your local emergency services (911 in the [place] Do not drive yourself to the hospital. Summary A headache is pain or discomfort felt around the head or neck area. There are many causes and types of headaches. In some cases, the cause may not be found. Keep a headache journal to help find out what may trigger your headaches. Watch your condition for any changes. Let your health care provider know about them. Contact a health care provider if you have a headache that is different from the usual headache, or if your symptoms are not helped by medicine. Get help right away if your headache becomes severe, you vomit, you have a loss of vision, you lose your balance, or you have a seizure. This information is not intended to replace advice given to you by your health care provider. Make sure you discuss any questions you have with your health care provider. Document Revised: 04/19/2021 Document Reviewed: 04/19/2021 Elsevier Patient Education  [place].

## 2023-01-15 NOTE — Progress Notes (Signed)
Subjective  Chief Complaint  Patient presents with   Annual Exam    Pt here for Annual Exam and is currently fasting     HPI: Morgan [name] is a 72 y.o. female who presents to [facility] today for a Female Wellness Visit. She also has the concerns and/or needs as listed above in the chief complaint. These will be addressed in addition to the Health Maintenance Visit.   Wellness Visit: annual visit with health maintenance review and exam without Pap  HM: screens are current. Normal dexa 2022. Doing well overall but son in law is sick and father is in the hospital: lots of stress. Imms are current. Eye exam current Chronic disease f/u and/or acute problem visit: (deemed necessary to be done in addition to the wellness visit): Over last 2 months has had "sinus headaches" mostly mild and facial and relieved with advil/tylenol. However, last week had right sided temporal moderate to severe headache: throbbing associated with nausea. Had to stay in bed. Eventually resolved after caffeine. No history of migraines or severe headaches. No fevers or neck stiffness. Denies vision changes or other neurologic changes. No headaches since.  HTN: on lisinopril 16m daily and stable. No cp or sob. Occ leg edema responsive to lasix.  HLD On zocor 40 daily and fenofibrate 1654mdaily. Tolerates well. H/o fatty liver. No abd pain or myalgias.  RA: mildly active between meds; sees rheum next.  Outpatient Encounter Medications as of 01/15/2023  Medication Sig   acetaminophen (TYLENOL) 500 MG tablet Take 1,000 mg by mouth every 6 (six) hours as needed for moderate pain or headache.    aspirin EC 81 MG tablet Take 81 mg by mouth every Monday, Wednesday, and Friday. Swallow whole.   Calcium Carbonate-Vitamin D 600-400 MG-UNIT tablet Take 1 tablet by mouth 2 (two) times daily.    Cholecalciferol (VITAMIN D) 50 MCG (2000 UT) CAPS Take 2,000 Units by mouth daily.    doxepin (SINEQUAN) 10  MG capsule Take 10 mg by mouth 2 (two) times daily.   fenofibrate 160 MG tablet TAKE 1 TABLET BY MOUTH AT BEDTIME.   fexofenadine (ALLEGRA) 180 MG tablet Take 180 mg by mouth daily.   fluticasone (FLONASE) 50 MCG/ACT nasal spray USE 2 SPRAYS IN EACH NOSTRIL DAILY AS NEEDED FOR ALLERGIES   furosemide (LASIX) 20 MG tablet Take 1 tablet (20 mg total) by mouth daily as needed for edema. Take with potassium   lisinopril (ZESTRIL) 10 MG tablet TAKE 1 TABLET BY MOUTH EVERY DAY   Multiple Vitamin (MULTIVITAMIN) tablet Take 1 tablet by mouth daily.   ondansetron (ZOFRAN) 4 MG tablet TAKE 1 TABLET BY MOUTH EVERY 6 HOURS AS NEEDED FOR NAUSEA   polyethylene glycol (MIRALAX) 17 g packet Take 17 g by mouth daily.   potassium chloride SA (KLOR-CON) 20 MEQ tablet Take 1 tablet (20 mEq total) by mouth daily as needed (leg swelling). Take with furosemide   predniSONE (DELTASONE) 5 MG tablet Take 5 mg by mouth daily.   simvastatin (ZOCOR) 40 MG tablet TAKE 1 TABLET BY MOUTH EVERYDAY AT BEDTIME   tiZANidine (ZANAFLEX) 2 MG tablet Take 1-2 tablets (2-4 mg total) by mouth every 8 (eight) hours as needed.   Urea 47 % CREA Apply 1 application topically daily as needed (irritation).    valACYclovir (VALTREX) 500 MG tablet Take 500 mg by mouth 2 (two) times daily.   [DISCONTINUED] benzonatate (TESSALON) 200 MG capsule TAKE 1 CAPSULE (200  MG TOTAL) BY MOUTH 3 (THREE) TIMES DAILY AS NEEDED FOR COUGH.   [DISCONTINUED] COVID-19 mRNA vaccine 2023-2024 (COMIRNATY) syringe Inject into the muscle.   [DISCONTINUED] RSV vaccine recomb adjuvanted (AREXVY) 120 MCG/0.5ML injection Inject into the muscle.   No facility-administered encounter medications on file as of 01/15/2023.    Assessment  1. Annual physical exam   2. Essential hypertension   3. Mixed hyperlipidemia   4. Immunosuppressed status ([place])   5. Rheumatoid arthritis involving multiple sites with positive rheumatoid factor (HCC)   6. Thrombocytopenia ([place])   7.  Unilateral headache   8. Current chronic use of systemic steroids      Plan  Female Wellness Visit: Age appropriate Health Maintenance and Prevention measures were discussed with patient. Included topics are cancer screening recommendations, ways to keep healthy (see AVS) including dietary and exercise recommendations, regular eye and dental care, use of seat belts, and avoidance of moderate alcohol use and tobacco use.  BMI: discussed patient's BMI and encouraged positive lifestyle modifications to help get to or maintain a target BMI. HM needs and immunizations were addressed and ordered. See below for orders. See HM and immunization section for updates. Routine labs and screening tests ordered including cmp, cbc and lipids where appropriate. Discussed recommendations regarding Vit D and calcium supplementation (see AVS)  Chronic disease management visit and/or acute problem visit: Unilateral headache: worrisome feature: severity. No other red flags. Discussed brain imaging, pt defers and will monitor. IF recurs, will get CT. IF severe, to ER. ? Related to tension headache vs migraine vs other. Discussed sxs of SAH and to ER. Pt understands instructions.  HTN is controlled on lisinopril 10 daily. Check renal function and electrolytes. HLD on zocor 40 and fenofibrate 160 and fasting for recheck with liver tests today RA: on meds and immunocomprimised: to f/u with rheum to manage mild worsening sxs.  Mood/gerd are controlled. Sleep is good.  Rare edema on prn lasix 20.   Follow up: 6 mo for recheck  Orders Placed This Encounter  Procedures   CBC with Differential/Platelet   Comprehensive metabolic panel   Lipid panel   TSH   No orders of the defined types were placed in this encounter.     Body mass index is 34.26 kg/m. Wt Readings from Last 3 Encounters:  01/15/23 199 lb 9.6 oz (90.5 kg)  10/22/22 202 lb 6.4 oz (91.8 kg)  10/15/22 203 lb (92.1 kg)     Patient Active Problem  List   Diagnosis Date Noted   Psoriasis 12/27/2020    Priority: High   Rheumatoid arthritis involving multiple sites with positive rheumatoid factor ([place]) 10/31/2017    Priority: High   Immunosuppressed status ([place]) 10/08/2017    Priority: High    Overview:  Humira for RA; frequent pred; no LIVE vaccines    Obesity with body mass index 30 or greater 01/25/2017    Priority: High   Current chronic use of systemic steroids 06/08/2016    Priority: High   Essential hypertension 10/20/2008    Priority: High   Mixed hyperlipidemia 04/23/2008    Priority: High   H/O partial resection of colon 01/31/2018    Priority: Medium    Leukocytosis 08/27/2014    Priority: Medium     Overview:  Reactive from RA but undergoing eval by heme. Dr. Alen Blew.    Fatty liver disease, nonalcoholic [id number]    Priority: Medium    Benign colon polyp 10/20/2008    Priority: Medium  Diverticulosis of colon 10/20/2008    Priority: Medium    Venous (peripheral) insufficiency 04/23/2008    Priority: Medium    Gastroesophageal reflux disease without esophagitis 04/23/2008    Priority: Medium    Bilateral sensorineural hearing loss 02/02/2019    Priority: Low   Uses hearing aid 12/09/2015    Priority: Low   Thrombocytopenia ([place]) 11/23/2013    Priority: Low   Allergic rhinitis 04/23/2008    Priority: Low   Screening for osteoporosis 10/09/2021    Normal Dexa 10/2021    Health Maintenance  Topic Date Due   COVID-19 Vaccine (7 - 2023-24 season) 01/31/2023 (Originally 12/14/2022)   MAMMOGRAM  01/02/2024   COLONOSCOPY (Pts 45-72yr Insurance coverage will need to be confirmed)  01/09/2024   DEXA SCAN  10/04/2026   DTaP/Tdap/Td (2 - Td or Tdap) 12/10/2027   Pneumonia Vaccine 74 Years old  Completed   INFLUENZA VACCINE  Completed   Hepatitis C Screening  Completed   HPV VACCINES  Aged Out   Zoster Vaccines- Shingrix  Discontinued   Immunization History  Administered Date(s) Administered    COVID-19, mRNA, vaccine(Comirnaty)12 years and older 10/19/2022   Fluad Quad(high Dose 65+) 10/01/2019, 09/22/2020, 08/29/2022   Influenza Split 01/09/2012, 08/25/2012, 09/30/2013   Influenza Whole 12/12/2009   Influenza, High Dose Seasonal PF 09/03/2016, 09/19/2017, 09/11/2018   Influenza,inj,quad, With Preservative 09/02/2018, 08/04/2019   Influenza-Unspecified 08/03/2014, 08/04/2015, 08/03/2016, 09/16/2017   Moderna Covid-19 Vaccine Bivalent Booster 168yr& up 10/05/2021, 03/29/2022   Moderna SARS-COV2 Booster Vaccination 01/27/2021   Moderna Sars-Covid-2 Vaccination 01/01/2020, 01/29/2020, 08/19/2020   Pfizer Covid-19 Vaccine Bivalent Booster 1271yr up 10/19/2022   Pneumococcal Conjugate-13 08/27/2014   Pneumococcal Polysaccharide-23 12/09/2015   Pneumococcal-Unspecified 08/03/2014, 12/04/2015   Respiratory Syncytial Virus Vaccine,Recomb Aduvanted(Arexvy) 11/07/2022   Tdap 12/09/2017   We updated and reviewed the patient's past history in detail and it is documented below. Allergies: Patient is allergic to remicade [infliximab], adalimumab, bee venom, golimumab, poison ivy extract, and vitamin e. Past Medical History Patient  has a past medical history of Abnormal chest x-ray, Abnormal electrocardiogram, Abscess of sigmoid colon due to diverticulitis (11/04/2017), Allergic rhinitis, Allergy, Cancer ([place], Colon polyp, Complication of anesthesia, Contact dermatitis due to poison ivy, Cystitis, Diverticulitis with abscess status post robotic low anterior rectosigmoid resection 12/11/2017 (12/11/2017), Diverticulosis of colon, Fatty liver disease, nonalcoholic, GERD (gastroesophageal reflux disease), Hearing loss, Hemorrhoids, Hyperlipidemia, Hypertension, Lymphocytosis, Overweight(278.02), Pneumonia, PONV (postoperative nausea and vomiting), Psoriasis (12/27/2020), RA (rheumatoid arthritis) ([facility]CCStarS/P reverse total shoulder arthroplasty, 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 Past  Surgical History Patient  has a past surgical history that includes Total abdominal hysterectomy (12/86); Inner ear surgery (Right); Nasal septum surgery; Foot surgery; chemical and laser endovenous ablation of LE veins (2008, 2009, 2010, 2011); Cesarean section; Proctoscopy (N/A, 12/11/2017); Laparoscopic lysis of adhesions (N/A, 12/11/2017); Tympanoplasty (Right, 11/03/2018); Colonoscopy; Polypectomy; Reverse shoulder arthroplasty (Right, 09/10/2019); Incision and drainage (Bilateral, 04/22/2020); Incision and drainage abscess (Left, 04/24/2020); Dressing change under anesthesia (Right, 04/24/2020); endoscopy; I & D extremity (Left, 06/09/2020); Application of a-cell of extremity (Left, 06/09/2020); Application if wound vac (Left, 06/09/2020); Tonsillectomy; Skin split graft (Left, [id number] Application if wound vac (Left, 07/26/2020). Family History: Patient family history includes Arthritis in her mother; Atrial fibrillation in her mother; Breast cancer in an other family member; COPD in her mother; Diabetes in her maternal aunt, maternal uncle, and sister; Healthy in her sister; Heart disease in her mother and sister; Heart failure in her brother and son; Hyperlipidemia  in her mother; Hypertension in her mother; Liver disease in her sister. Social History:  Patient  reports that she quit smoking about 21 years ago. Her smoking use included cigarettes. She has a 7.50 pack-year smoking history. She has never used smokeless tobacco. She reports current alcohol use. She reports that she does not use drugs.  Review of Systems: Constitutional: negative for fever or malaise Ophthalmic: negative for photophobia, double vision or loss of vision Cardiovascular: negative for chest pain, dyspnea on exertion, or new LE swelling Respiratory: negative for SOB or persistent cough Gastrointestinal: negative for abdominal pain, change in bowel habits or melena Genitourinary: negative for dysuria or gross hematuria, no  abnormal uterine bleeding or disharge Musculoskeletal: negative for new gait disturbance or muscular weakness Integumentary: negative for new or persistent rashes, no breast lumps Neurological: negative for TIA or stroke symptoms Psychiatric: negative for SI or delusions Allergic/Immunologic: negative for hives  Patient Care Team    Relationship Specialty Notifications Start End  [name], MD PCP - General Family Medicine  09/01/14   Michael Boston, MD Consulting Physician General Surgery  10/08/17   Irene Shipper, MD Consulting Physician Gastroenterology  10/10/17   Wyatt Portela, MD Consulting Physician Hematology and Oncology  10/10/17   Michel Bickers, MD Consulting Physician Infectious Diseases  11/04/17   Gavin Pound, MD Consulting Physician Rheumatology  11/04/17   Justice Britain, MD Consulting Physician Orthopedic Surgery  03/01/20     Objective  Vitals: BP 138/70   Pulse 79   Temp 97.9 F (36.6 C)   Ht 5' 4"$  (1.626 m)   Wt 199 lb 9.6 oz (90.5 kg)   LMP  (LMP Unknown)   SpO2 97%   BMI 34.26 kg/m  General:  Well developed, well nourished, no acute distress  Psych:  Alert and orientedx3,normal mood and affect HEENT:  Normocephalic, atraumatic, non-icteric sclera,  supple neck without adenopathy, mass or thyromegaly Cardiovascular:  Normal S1, S2, RRR without gallop, rub or murmur Respiratory:  Good breath sounds bilaterally, CTAB with normal respiratory effort Gastrointestinal: normal bowel sounds, soft, non-tender, no noted masses. No HSM MSK: no deformities, contusions. Joints are without erythema or swelling.  Skin:  Warm, steroid dermopathy changes present. No open wounds Neurologic:    Mental status is normal. Gross motor and sensory exams are normal. Normal gait. No tremor   Commons side effects, risks, benefits, and alternatives for medications and treatment plan prescribed today were discussed, and the patient expressed understanding of the given instructions.  Patient is instructed to call or message via MyChart if he/she has any questions or concerns regarding our treatment plan. No barriers to understanding were identified. We discussed Red Flag symptoms and signs in detail. Patient expressed understanding regarding what to do in case of urgent or emergency type symptoms.  Medication list was reconciled, printed and provided to the patient in AVS. Patient instructions and summary information was reviewed with the patient as documented in the AVS. This note was prepared with assistance of Dragon voice recognition software. Occasional wrong-word or sound-a-like substitutions may have occurred due to the inherent limitations of voice recognition software

## 2023-01-17 ENCOUNTER — Encounter: Payer: Self-pay | Admitting: Family Medicine

## 2023-01-22 DIAGNOSIS — L578 Other skin changes due to chronic exposure to nonionizing radiation: Secondary | ICD-10-CM | POA: Diagnosis not present

## 2023-01-22 DIAGNOSIS — L570 Actinic keratosis: Secondary | ICD-10-CM | POA: Diagnosis not present

## 2023-01-22 DIAGNOSIS — D225 Melanocytic nevi of trunk: Secondary | ICD-10-CM | POA: Diagnosis not present

## 2023-01-22 DIAGNOSIS — L409 Psoriasis, unspecified: Secondary | ICD-10-CM | POA: Diagnosis not present

## 2023-01-22 DIAGNOSIS — L814 Other melanin hyperpigmentation: Secondary | ICD-10-CM | POA: Diagnosis not present

## 2023-01-24 ENCOUNTER — Other Ambulatory Visit: Payer: Self-pay | Admitting: Family Medicine

## 2023-02-25 ENCOUNTER — Encounter (INDEPENDENT_AMBULATORY_CARE_PROVIDER_SITE_OTHER): Payer: Medicare Other | Admitting: Family Medicine

## 2023-02-25 DIAGNOSIS — R0981 Nasal congestion: Secondary | ICD-10-CM

## 2023-02-25 DIAGNOSIS — R519 Headache, unspecified: Secondary | ICD-10-CM

## 2023-02-26 ENCOUNTER — Ambulatory Visit (INDEPENDENT_AMBULATORY_CARE_PROVIDER_SITE_OTHER): Payer: Medicare Other | Admitting: Physician Assistant

## 2023-02-26 MED ORDER — BENZONATATE 200 MG PO CAPS: 200.0000 mg | ORAL_CAPSULE | Freq: Two times a day (BID) | ORAL | 0 refills | Status: DC | PRN

## 2023-02-26 MED ORDER — AZITHROMYCIN 250 MG PO TABS: ORAL_TABLET | ORAL | 0 refills | Status: DC

## 2023-02-26 NOTE — Telephone Encounter (Signed)
A total of 5 minutes were spent by me to personally review the patient-generated inquiry, review patient records and data pertinent to assessment of the patient's problem, develop a management plan including generation of prescriptions and/or orders, and on subsequent communication with the patient through secure the MyChart portal service. There is no separately reported E/M service related to this service in the past 7 days nor does the patient have an upcoming soonest available appointment for this issue. This work was completed in less than 7 days.

## 2023-02-27 NOTE — Progress Notes (Signed)
Patient not seen by me.  No charge.

## 2023-03-27 ENCOUNTER — Ambulatory Visit: Payer: Medicare Other | Admitting: Family Medicine

## 2023-03-27 VITALS — BP 174/80 | HR 89 | Temp 98.0°F | Ht 64.0 in | Wt 197.6 lb

## 2023-03-27 DIAGNOSIS — Z0000 Encounter for general adult medical examination without abnormal findings: Secondary | ICD-10-CM

## 2023-03-27 DIAGNOSIS — M0579 Rheumatoid arthritis with rheumatoid factor of multiple sites without organ or systems involvement: Secondary | ICD-10-CM | POA: Diagnosis not present

## 2023-03-27 DIAGNOSIS — I10 Essential (primary) hypertension: Secondary | ICD-10-CM

## 2023-03-27 MED ORDER — LISINOPRIL 20 MG PO TABS: 20.0000 mg | ORAL_TABLET | Freq: Every day | ORAL | 3 refills | Status: DC

## 2023-03-27 MED ORDER — FEXOFENADINE HCL 180 MG PO TABS: 180.0000 mg | ORAL_TABLET | Freq: Every day | ORAL | Status: AC

## 2023-03-27 NOTE — Patient Instructions (Addendum)
Please return in 6 months for hypertension follow up.   If you have any questions or concerns, please don't hesitate to send me a message via MyChart or call the office at [phone]. Thank you for visiting with [place] today! It's our pleasure caring for you.   Fall Prevention in the Home, Adult Falls can cause injuries and can happen to people of all ages. There are many things you can do to make your home safer and to help prevent falls. What actions can I take to prevent falls? General information Use good lighting in all rooms. Make sure to: Replace any light bulbs that burn out. Turn on the lights in dark areas and use night-lights. Keep items that you use often in easy-to-reach places. Lower the shelves around your home if needed. Move furniture so that there are clear paths around it. Do not use throw rugs or other things on the floor that can make you trip. If any of your floors are uneven, fix them. Add color or contrast paint or tape to clearly mark and help you see: Grab bars or handrails. First and last steps of staircases. Where the edge of each step is. If you use a ladder or stepladder: Make sure that it is fully opened. Do not climb a closed ladder. Make sure the sides of the ladder are locked in place. Have someone hold the ladder while you use it. Know where your pets are as you move through your home. What can I do in the bathroom?     Keep the floor dry. Clean up any water on the floor right away. Remove soap buildup in the bathtub or shower. Buildup makes bathtubs and showers slippery. Use non-skid mats or decals on the floor of the bathtub or shower. Attach bath mats securely with double-sided, non-slip rug tape. If you need to sit down in the shower, use a non-slip stool. Install grab bars by the toilet and in the bathtub and shower. Do not use towel bars as grab bars. What can I do in the bedroom? Make sure that you have a light by your bed that is easy to  reach. Do not use any sheets or blankets on your bed that hang to the floor. Have a firm chair or bench with side arms that you can use for support when you get dressed. What can I do in the kitchen? Clean up any spills right away. If you need to reach something above you, use a step stool with a grab bar. Keep electrical cords out of the way. Do not use floor polish or wax that makes floors slippery. What can I do with my stairs? Do not leave anything on the stairs. Make sure that you have a light switch at the top and the bottom of the stairs. Make sure that there are handrails on both sides of the stairs. Fix handrails that are broken or loose. Install non-slip stair treads on all your stairs if they do not have carpet. Avoid having throw rugs at the top or bottom of the stairs. Choose a carpet that does not hide the edge of the steps on the stairs. Make sure that the carpet is firmly attached to the stairs. Fix carpet that is loose or worn. What can I do on the outside of my home? Use bright outdoor lighting. Fix the edges of walkways and driveways and fix any cracks. Clear paths of anything that can make you trip, such as tools or rocks. Add  color or contrast paint or tape to clearly mark and help you see anything that might make you trip as you walk through a door, such as a raised step or threshold. Trim any bushes or trees on paths to your home. Check to see if handrails are loose or broken and that both sides of all steps have handrails. Install guardrails along the edges of any raised decks and porches. Have leaves, snow, or ice cleared regularly. Use sand, salt, or ice melter on paths if you live where there is ice and snow during the winter. Clean up any spills in your garage right away. This includes grease or oil spills. What other actions can I take? Review your medicines with your doctor. Some medicines can cause dizziness or changes in blood pressure, which increase your risk  of falling. Wear shoes that: Have a low heel. Do not wear high heels. Have rubber bottoms and are closed at the toe. Feel good on your feet and fit well. Use tools that help you move around if needed. These include: Canes. Walkers. Scooters. Crutches. Ask your doctor what else you can do to help prevent falls. This may include seeing a physical therapist to learn to do exercises to move better and get stronger. Where to find more information Centers for Disease Control and Prevention, [employer]: TonerPromos[employer]no [employer] on Aging: BaseRingTones.pl [employer] on Aging: BaseRingTones.pl Contact a doctor if: You are afraid of falling at home. You feel weak, drowsy, or dizzy at home. You fall at home. Get help right away if you: Lose consciousness or have trouble moving after a fall. Have a fall that causes a head injury. These symptoms may be an emergency. Get help right away. Call 911. Do not wait to see if the symptoms will go away. Do not drive yourself to the hospital. This information is not intended to replace advice given to you by your health care provider. Make sure you discuss any questions you have with your health care provider. Document Revised: 07/23/2022 Document Reviewed: 07/23/2022 [employer] Patient Education  2023 [employer].

## 2023-03-27 NOTE — Progress Notes (Signed)
Subjective:    Morgan [name] is a 72 y.o. female who presents for a Welcome to Medicare exam.   Review of Systems Review of Systems: Constitutional: negative for fever or malaise Ophthalmic: negative for photophobia, double vision or loss of vision Cardiovascular: negative for chest pain, dyspnea on exertion, or new LE swelling Respiratory: negative for SOB or persistent cough Gastrointestinal: negative for abdominal pain, change in bowel habits or melena Genitourinary: negative for dysuria or gross hematuria Musculoskeletal: negative for new gait disturbance or muscular weakness, has chronic unsteadiness Integumentary: negative for new or persistent rashes Neurological: negative for TIA or stroke symptoms Psychiatric: negative for SI or delusions Allergic/Immunologic: negative for hives         Objective:    Today's Vitals   03/27/23 1321 03/27/23 1336  BP: (!) 164/70 (!) 174/80  Pulse: 89   Temp: 98 F (36.7 C)   SpO2: 96%   Weight: 197 lb 9.6 oz (89.6 kg)   Height:  (1.626 m)   Body mass index is 33.92 kg/m.  Medications Outpatient Encounter Medications as of 03/27/2023  Medication Sig   acetaminophen (TYLENOL) 500 MG tablet Take 1,000 mg by mouth every 6 (six) hours as needed for moderate pain or headache.    aspirin EC 81 MG tablet Take 81 mg by mouth every Monday, Wednesday, and Friday. Swallow whole.   Calcium Carbonate-Vitamin D 600-400 MG-UNIT tablet Take 1 tablet by mouth 2 (two) times daily.    Cholecalciferol (VITAMIN D) 50 MCG (2000 UT) CAPS Take 2,000 Units by mouth daily.    doxepin (SINEQUAN) 10 MG capsule Take 10 mg by mouth 2 (two) times daily.   fenofibrate 160 MG tablet TAKE 1 TABLET BY MOUTH EVERYDAY AT BEDTIME   fluticasone (FLONASE) 50 MCG/ACT nasal spray USE 2 SPRAYS IN EACH NOSTRIL DAILY AS NEEDED FOR ALLERGIES   furosemide (LASIX) 20 MG tablet Take 1 tablet (20 mg total) by mouth daily as needed for edema. Take with potassium    Multiple Vitamin (MULTIVITAMIN) tablet Take 1 tablet by mouth daily.   ondansetron (ZOFRAN) 4 MG tablet TAKE 1 TABLET BY MOUTH EVERY 6 HOURS AS NEEDED FOR NAUSEA   polyethylene glycol (MIRALAX) 17 g packet Take 17 g by mouth daily.   potassium chloride SA (KLOR-CON) 20 MEQ tablet Take 1 tablet (20 mEq total) by mouth daily as needed (leg swelling). Take with furosemide   predniSONE (DELTASONE) 5 MG tablet Take 5 mg by mouth daily.   simvastatin (ZOCOR) 40 MG tablet TAKE 1 TABLET BY MOUTH EVERYDAY AT BEDTIME   Urea 47 % CREA Apply 1 application topically daily as needed (irritation).    valACYclovir (VALTREX) 500 MG tablet Take 500 mg by mouth 2 (two) times daily.   [DISCONTINUED] fexofenadine (ALLEGRA) 180 MG tablet Take 180 mg by mouth daily.   [DISCONTINUED] lisinopril (ZESTRIL) 10 MG tablet TAKE 1 TABLET BY MOUTH EVERY DAY   fexofenadine (ALLEGRA) 180 MG tablet Take 1 tablet (180 mg total) by mouth daily.   lisinopril (ZESTRIL) 20 MG tablet Take 1 tablet (20 mg total) by mouth daily.   [DISCONTINUED] azithromycin (ZITHROMAX) 250 MG tablet Take 2 tabs today, then 1 tab daily for 4 days   [DISCONTINUED] benzonatate (TESSALON) 200 MG capsule Take 1 capsule (200 mg total) by mouth 2 (two) times daily as needed for cough.   [DISCONTINUED] tiZANidine (ZANAFLEX) 2 MG tablet Take 1-2 tablets (2-4 mg total) by mouth every 8 (eight) hours as needed.  No facility-administered encounter medications on file as of 03/27/2023.     History: Past Medical History:  Diagnosis Date   Abnormal chest x-ray    Abnormal electrocardiogram    Abscess of sigmoid colon due to diverticulitis 11/04/2017   Allergic rhinitis    Allergy    Cancer (HCC)    Skin -sqameous left hand basal cell face   Cataract    MD just watching   Colon polyp    polypoid colorectal mucosa/adenomatous   Complication of anesthesia    and cries   Contact dermatitis due to poison ivy    Cystitis    Diverticulitis with abscess status  post robotic low anterior rectosigmoid resection 12/11/2017 12/11/2017   Diffuse diverticulosis entire colon by colonoscopy 01/2019   Diverticulosis of colon    Fatty liver disease, nonalcoholic    GERD (gastroesophageal reflux disease)    occasional   Hearing loss    bilateral, wears hearing aids   Hemorrhoids    Hyperlipidemia    Hypertension    Lymphocytosis    Overweight(278.02)    Pneumonia    2017   PONV (postoperative nausea and vomiting)    Psoriasis 12/27/2020   RA (rheumatoid arthritis) (HCC)    Rheumatoid   S/P reverse total shoulder arthroplasty, 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   Venous insufficiency    Past Surgical History:  Procedure Laterality Date   APPLICATION OF A-CELL OF EXTREMITY Left 06/09/2020   Procedure: APPLICATION OF INTEGRA BILAYER OR PRIMATRIX;  Surgeon: Allena Napoleon, MD;  Location: [facility] OR;  Service: Plastics;  Laterality: Left;   APPLICATION OF WOUND VAC Left 06/09/2020   Procedure: APPLICATION OF WOUND VAC;  Surgeon: Allena Napoleon, MD;  Location: [facility];  Service: Plastics;  Laterality: Left;   APPLICATION OF WOUND VAC Left 07/26/2020   Procedure: APPLICATION OF WOUND VAC;  Surgeon: Allena Napoleon, MD;  Location: [facility];  Service: Plastics;  Laterality: Left;  APPLICATION OF WOUND VAC   CESAREAN SECTION     [phone]   chemical and laser endovenous ablation of LE veins  2008, 2009, 2010, 2011   Dr Donia Ast et al   COLONOSCOPY     multiple   DRESSING CHANGE UNDER ANESTHESIA Right 04/24/2020   Procedure: DRESSING CHANGE UNDER ANESTHESIA right knee;  Surgeon: Eugenia Mcalpine, MD;  Location: [facility];  Service: Orthopedics;  Laterality: Right;   endoscopy     multiple   FOOT SURGERY     left   I & D EXTREMITY Left 06/09/2020   Procedure: Debridement of left knee wound;  Surgeon: Allena Napoleon, MD;  Location: [facility];  Service: Plastics;  Laterality: Left;  total case 45 min   INCISION AND DRAINAGE Bilateral 04/22/2020   Procedure: INCISION AND DRAINAGE, WOUND VAC  PLACEMENT ON LEFT KNEE;  Surgeon: Samson Frederic, MD;  Location: [facility];  Service: Orthopedics;  Laterality: Bilateral;   INCISION AND DRAINAGE ABSCESS Left 04/24/2020   Procedure: INCISION AND DRAINAGE LEFT KNEE;  Surgeon: Eugenia Mcalpine, MD;  Location: [facility];  Service: Orthopedics;  Laterality: Left;   INNER EAR SURGERY Right    childhood; right, ear drum repair   LAPAROSCOPIC LYSIS OF ADHESIONS N/A 12/11/2017   Procedure: LAPAROSCOPIC LYSIS OF ADHESIONS;  Surgeon: Karie Soda, MD;  Location: [facility];  Service: General;  Laterality: N/A;   NASAL SEPTUM SURGERY     POLYPECTOMY     PROCTOSCOPY N/A 12/11/2017   Procedure: RIDGED PROCTOSCOPY;  Surgeon: Karie Soda, MD;  Location:  [facility];  Service: General;  Laterality: N/A;   REVERSE SHOULDER ARTHROPLASTY Right 09/10/2019   Procedure: REVERSE SHOULDER ARTHROPLASTY;  Surgeon: Francena Hanly, MD;  Location: [facility];  Service: Orthopedics;  Laterality: Right;    SKIN SPLIT GRAFT Left 07/26/2020   Procedure: SKIN GRAFT SPLIT THICKNESS;  Surgeon: Allena Napoleon, MD;  Location: [facility] OR;  Service: Plastics;  Laterality: Left;  SKIN GRAFT SPLIT THICKNESS   TONSILLECTOMY     TOTAL ABDOMINAL HYSTERECTOMY  12/86   TYMPANOPLASTY Right 11/03/2018   Procedure: TYMPANOPLASTY;  Surgeon: Christia Reading, MD;  Location: [facility];  Service: ENT;  Laterality: Right;    Family History  Problem Relation Age of Onset   Hyperlipidemia Mother    Hypertension Mother    COPD Mother    Arthritis Mother    Heart disease Mother    Atrial fibrillation Mother    Diabetes Sister    Healthy Sister    Heart disease Sister        sister #1   Breast cancer Other        half-sister   Liver disease Sister        sister #1 - hx of liver transplant 14+ yrs ago   Diabetes Maternal Uncle    Heart failure Brother    Diabetes Maternal Aunt        x 2   Heart failure Son    Colon cancer Neg Hx    Colon polyps Neg Hx    Esophageal cancer Neg Hx    Rectal  cancer Neg Hx    Stomach cancer Neg Hx    Social History   Occupational History   Occupation: Adult nurse: [employer]  Tobacco Use   Smoking status: Former    Packs/day: 0.50    Years: 15.00    Additional pack years: 0.00    Total pack years: 7.50    Types: Cigarettes    Quit date: 12/03/2001    Years since quitting: 21.3   Smokeless tobacco: Never  Vaping Use   Vaping Use: Never used  Substance and Sexual Activity   Alcohol use: Yes    Comment: occasional   Drug use: No   Sexual activity: Not Currently    Birth control/protection: Surgical    Comment: Hysterectomy    Tobacco Counseling Counseling given: Not Answered   Immunizations and Health Maintenance Immunization History  Administered Date(s) Administered   COVID-19, mRNA, vaccine(Comirnaty)12 years and older 10/19/2022   Fluad Quad(high Dose 65+) 10/01/2019, 09/22/2020, 08/29/2022   Influenza Split 01/09/2012, 08/25/2012, 09/30/2013   Influenza Whole 12/12/2009   Influenza, High Dose Seasonal PF 09/03/2016, 09/19/2017, 09/11/2018   Influenza,inj,quad, With Preservative 09/02/2018, 08/04/2019   Influenza-Unspecified 08/03/2014, 08/04/2015, 08/03/2016, 09/16/2017   Moderna Covid-19 Vaccine Bivalent Booster 23yrs & up 10/05/2021, 03/29/2022   Moderna SARS-COV2 Booster Vaccination 01/27/2021   Moderna Sars-Covid-2 Vaccination 01/01/2020, 01/29/2020, 08/19/2020   Pfizer Covid-19 Vaccine Bivalent Booster 4yrs & up 10/19/2022   Pneumococcal Conjugate-13 08/27/2014   Pneumococcal Polysaccharide-23 12/09/2015   Pneumococcal-Unspecified 08/03/2014, 12/04/2015   Respiratory Syncytial Virus Vaccine,Recomb Aduvanted(Arexvy) 11/07/2022   Tdap 12/09/2017   There are no preventive care reminders to display for this patient.   Activities of Daily Living Functional screen score: 1/6 (trouble going up and down stairs)  Physical Exam cardiac exam is normal with regular rate and rhythm and no  murmur, normal lung exam, no edema (optional), or other factors deemed appropriate based on  the beneficiary's medical and social history and current clinical standards.  Advanced Directives: Patient has healthcare power of attorney and living well      Assessment:    This is a routine wellness examination for this patient .   Vision/Hearing screen Hearing Screening   125Hz  250Hz  500Hz  1000Hz  2000Hz  3000Hz  4000Hz  5000Hz  6000Hz  8000Hz   Right ear Pass Pass Pass Pass Pass Pass Pass Pass Pass Pass  Left ear Pass Pass Pass Pass Pass Pass Pass Pass Pass Pass   Vision Screening   Right eye Left eye Both eyes  Without correction     With correction 20/20 20/50 20/20     Dietary issues and exercise activities discussed:      Goals   None    Depression Screen    03/27/2023    1:16 PM 01/15/2023    8:37 AM 08/03/2022   10:14 AM 08/01/2022    3:42 PM  PHQ 2/9 Scores  PHQ - 2 Score 0 0 0 0     Fall Risk    03/27/2023    1:16 PM  Fall Risk   Falls in the past year? 0  Number falls in past yr: 1  Injury with Fall? 0  Risk for fall due to : History of fall(s)  Follow up Falls evaluation completed    Cognitive Function:        03/27/2023    1:16 PM  6CIT Screen  What Year? 0 points  What month? 0 points  What time? 0 points  Count back from 20 0 points  Months in reverse 0 points  Repeat phrase 0 points  Total Score 0 points    Patient Care Team: Willow Ora, MD as PCP - General (Family Medicine) Karie Soda, MD as Consulting Physician (General Surgery) Hilarie Fredrickson, MD as Consulting Physician (Gastroenterology) Benjiman Core, MD as Consulting Physician (Hematology and Oncology) Cliffton Asters, MD as Consulting Physician (Infectious Diseases) Zenovia Jordan, MD as Consulting Physician (Rheumatology) Francena Hanly, MD as Consulting Physician (Orthopedic Surgery)     Plan:   Routine guidance given.  Screens reviewed. Discussed fall prevention, strength  training and use of cane for ambulation.   See next follow-up visit.  I have personally reviewed and noted the following in the patient's chart:   Medical and social history Use of alcohol, tobacco or illicit drugs  Current medications and supplements Functional ability and status Nutritional status Physical activity Advanced directives List of other physicians Hospitalizations, surgeries, and ER visits in previous 12 months Vitals Screenings to include cognitive, depression, and falls Referrals and appointments  In addition, I have reviewed and discussed with patient certain preventive protocols, quality metrics, and best practice recommendations. A written personalized care plan for preventive services as well as general preventive health recommendations were provided to patient.     Willow Ora, MD 03/27/2023 IN addition: a f/u HTN visit was conducted:   Subjective  CC:  Chief Complaint  Patient presents with   welcome to medicare   Hypertension    HPI: Morgan [name] is a 72 y.o. female who presents to the office today to address the problems listed above in the chief complaint. Hypertension f/u: Control is fair . Pt reports she is doing well. taking medications as instructed, no medication side effects noted, no TIAs, no chest pain on exertion, no dyspnea on exertion, no swelling of ankles. However, readings are elevated over last few visits. She is in pain due to RA flare/rheum  has changed meds and not yet very effective w/ wrist and knee pain. She denies adverse effects from his BP medications. Compliance with medication is good.   Assessment  1. Encounter for Medicare annual wellness exam   2. Essential hypertension   3. Rheumatoid arthritis involving multiple sites with positive rheumatoid factor      Plan   Hypertension f/u: BP control is poorly controlled.  Could be due to pain response.  Will adjust up lisinopril from 10 to 20 mg daily.  Patient continue  home monitoring.  Hopefully new RA medication will start to effectively control pain better. Discussed fall prevention, strength training and use of cane for ambulation.  Education regarding management of these chronic disease states was given. Management strategies discussed on successive visits include dietary and exercise recommendations, goals of achieving and maintaining IBW, and lifestyle modifications aiming for adequate sleep and minimizing stressors.   Follow up: 6 months for blood pressure recheck  No orders of the defined types were placed in this encounter.  Meds ordered this encounter  Medications   fexofenadine (ALLEGRA) 180 MG tablet    Sig: Take 1 tablet (180 mg total) by mouth daily.   lisinopril (ZESTRIL) 20 MG tablet    Sig: Take 1 tablet (20 mg total) by mouth daily.    Dispense:  90 tablet    Refill:  3      BP Readings from Last 3 Encounters:  03/27/23 (!) 174/80  01/15/23 138/70  10/22/22 (!) 140/60   Wt Readings from Last 3 Encounters:  03/27/23 197 lb 9.6 oz (89.6 kg)  01/15/23 199 lb 9.6 oz (90.5 kg)  10/22/22 202 lb 6.4 oz (91.8 kg)    Lab Results  Component Value Date   CHOL 144 01/15/2023   CHOL 119 01/11/2022   CHOL 131 12/27/2020   Lab Results  Component Value Date   HDL 53.10 01/15/2023   HDL 49.30 01/11/2022   HDL 51.90 12/27/2020   Lab Results  Component Value Date   LDLCALC 74 01/15/2023   LDLCALC 56 01/11/2022   LDLCALC 61 12/27/2020   Lab Results  Component Value Date   TRIG 83.0 01/15/2023   TRIG 68.0 01/11/2022   TRIG 89.0 12/27/2020   Lab Results  Component Value Date   CHOLHDL 3 01/15/2023   CHOLHDL 2 01/11/2022   CHOLHDL 3 12/27/2020   Lab Results  Component Value Date   LDLDIRECT 100.9 05/28/2007   LDLDIRECT 109.2 01/28/2007   Lab Results  Component Value Date   CREATININE 0.90 01/15/2023   BUN 24 (H) 01/15/2023   NA 138 01/15/2023   K 4.4 01/15/2023   CL 103 01/15/2023   CO2 27 01/15/2023    The  10-year ASCVD risk score (Arnett DK, et al., 2019) is: 28.2%   Values used to calculate the score:     Age: 60 years     Sex: Female     Is Non-Hispanic African American: No     Diabetic: No     Tobacco smoker: No     Systolic Blood Pressure: 174 mmHg     Is BP treated: Yes     HDL Cholesterol: 53.1 mg/dL     Total Cholesterol: 144 mg/dL  I reviewed the patients updated PMH, FH, and SocHx.    Patient Active Problem List   Diagnosis Date Noted   Psoriasis 12/27/2020    Priority: High   Rheumatoid arthritis involving multiple sites with positive rheumatoid factor 10/31/2017  Priority: High   Immunosuppressed status 10/08/2017    Priority: High   Obesity with body mass index 30 or greater 01/25/2017    Priority: High   Current chronic use of systemic steroids 06/08/2016    Priority: High   Essential hypertension 10/20/2008    Priority: High   Mixed hyperlipidemia 04/23/2008    Priority: High   H/O partial resection of colon 01/31/2018    Priority: Medium    Leukocytosis 08/27/2014    Priority: Medium    Fatty liver disease, nonalcoholic 11/24/2010    Priority: Medium    Benign colon polyp 10/20/2008    Priority: Medium    Diverticulosis of colon 10/20/2008    Priority: Medium    Venous (peripheral) insufficiency 04/23/2008    Priority: Medium    Gastroesophageal reflux disease without esophagitis 04/23/2008    Priority: Medium    Bilateral sensorineural hearing loss 02/02/2019    Priority: Low   Uses hearing aid 12/09/2015    Priority: Low   Thrombocytopenia 11/23/2013    Priority: Low   Allergic rhinitis 04/23/2008    Priority: Low   Screening for osteoporosis 10/09/2021    Allergies: Remicade [infliximab], Adalimumab, Bee venom, Golimumab, Poison ivy extract, and Vitamin e  Social History: Patient  reports that she quit smoking about 21 years ago. Her smoking use included cigarettes. She has a 7.50 pack-year smoking history. She has never used smokeless  tobacco. She reports current alcohol use. She reports that she does not use drugs.  Current Meds  Medication Sig   acetaminophen (TYLENOL) 500 MG tablet Take 1,000 mg by mouth every 6 (six) hours as needed for moderate pain or headache.    aspirin EC 81 MG tablet Take 81 mg by mouth every Monday, Wednesday, and Friday. Swallow whole.   Calcium Carbonate-Vitamin D 600-400 MG-UNIT tablet Take 1 tablet by mouth 2 (two) times daily.    Cholecalciferol (VITAMIN D) 50 MCG (2000 UT) CAPS Take 2,000 Units by mouth daily.    doxepin (SINEQUAN) 10 MG capsule Take 10 mg by mouth 2 (two) times daily.   fenofibrate 160 MG tablet TAKE 1 TABLET BY MOUTH EVERYDAY AT BEDTIME   fluticasone (FLONASE) 50 MCG/ACT nasal spray USE 2 SPRAYS IN EACH NOSTRIL DAILY AS NEEDED FOR ALLERGIES   furosemide (LASIX) 20 MG tablet Take 1 tablet (20 mg total) by mouth daily as needed for edema. Take with potassium   Multiple Vitamin (MULTIVITAMIN) tablet Take 1 tablet by mouth daily.   ondansetron (ZOFRAN) 4 MG tablet TAKE 1 TABLET BY MOUTH EVERY 6 HOURS AS NEEDED FOR NAUSEA   polyethylene glycol (MIRALAX) 17 g packet Take 17 g by mouth daily.   potassium chloride SA (KLOR-CON) 20 MEQ tablet Take 1 tablet (20 mEq total) by mouth daily as needed (leg swelling). Take with furosemide   predniSONE (DELTASONE) 5 MG tablet Take 5 mg by mouth daily.   simvastatin (ZOCOR) 40 MG tablet TAKE 1 TABLET BY MOUTH EVERYDAY AT BEDTIME   Urea 47 % CREA Apply 1 application topically daily as needed (irritation).    valACYclovir (VALTREX) 500 MG tablet Take 500 mg by mouth 2 (two) times daily.   [DISCONTINUED] fexofenadine (ALLEGRA) 180 MG tablet Take 180 mg by mouth daily.   [DISCONTINUED] lisinopril (ZESTRIL) 10 MG tablet TAKE 1 TABLET BY MOUTH EVERY DAY    Review of Systems: Cardiovascular: negative for chest pain, palpitations, leg swelling, orthopnea Respiratory: negative for SOB, wheezing or persistent cough Gastrointestinal: negative for  abdominal pain Genitourinary: negative for dysuria or gross hematuria  Objective  Vitals: BP (!) 174/80   Pulse 89   Temp 98 F (36.7 C)   Ht  (1.626 m)   Wt 197 lb 9.6 oz (89.6 kg)   LMP  (LMP Unknown)   SpO2 96%   BMI 33.92 kg/m  General: no acute distress  Psych:  Alert and oriented, normal mood and affect HEENT:  Normocephalic, atraumatic, supple neck  Cardiovascular:  RRR without murmur. no edema Respiratory:  Good breath sounds bilaterally, CTAB with normal respiratory effort Skin:  Warm, no rashes Neurologic:   Mental status is normal Commons side effects, risks, benefits, and alternatives for medications and treatment plan prescribed today were discussed, and the patient expressed understanding of the given instructions. Patient is instructed to call or message via MyChart if he/she has any questions or concerns regarding our treatment plan. No barriers to understanding were identified. We discussed Red Flag symptoms and signs in detail. Patient expressed understanding regarding what to do in case of urgent or emergency type symptoms.  Medication list was reconciled, printed and provided to the patient in AVS. Patient instructions and summary information was reviewed with the patient as documented in the AVS. This note was prepared with assistance of Dragon voice recognition software. Occasional wrong-word or sound-a-like substitutions may have occurred due to the inherent limitation

## 2023-04-01 ENCOUNTER — Encounter: Payer: Self-pay | Admitting: Family Medicine

## 2023-04-01 ENCOUNTER — Ambulatory Visit (INDEPENDENT_AMBULATORY_CARE_PROVIDER_SITE_OTHER): Payer: Medicare Other | Admitting: Family Medicine

## 2023-04-01 VITALS — BP 148/76 | HR 78 | Temp 97.8°F | Ht 64.0 in | Wt 198.2 lb

## 2023-04-01 DIAGNOSIS — J301 Allergic rhinitis due to pollen: Secondary | ICD-10-CM

## 2023-04-01 DIAGNOSIS — R051 Acute cough: Secondary | ICD-10-CM | POA: Diagnosis not present

## 2023-04-01 DIAGNOSIS — J0100 Acute maxillary sinusitis, unspecified: Secondary | ICD-10-CM | POA: Diagnosis not present

## 2023-04-01 DIAGNOSIS — H6692 Otitis media, unspecified, left ear: Secondary | ICD-10-CM

## 2023-04-01 DIAGNOSIS — D849 Immunodeficiency, unspecified: Secondary | ICD-10-CM

## 2023-04-01 LAB — POC COVID19 BINAXNOW: SARS Coronavirus 2 Ag: NEGATIVE

## 2023-04-01 MED ORDER — AZITHROMYCIN 250 MG PO TABS: ORAL_TABLET | ORAL | 0 refills | Status: DC

## 2023-04-01 NOTE — Progress Notes (Signed)
Subjective   CC:  Chief Complaint  Patient presents with   Cough    Pt stated that she has been coughing since 03/27/2023. Temp went up to 99.0 last night and the cough has gotten worse and head hurts and ears stopped up     HPI: Morgan [name] is a 72 y.o. female who presents to the office today to address the problems listed above in the chief complaint. Symptoms started last week with allergy symptoms.  However over the weekend left sinus pressure and ear pain started.  Has had mildly productive cough.  Temperature to 99.  Shedenies high fevers, GI symptoms, shortness of breath. Shehas had sinus infections in the past and this feels similar.  Patient is a non-smoker.  No history of asthma or COPD.  She is immunosuppressed.  I reviewed the patients updated PMH, FH, and SocHx.    Patient Active Problem List   Diagnosis Date Noted   Psoriasis 12/27/2020    Priority: High   Rheumatoid arthritis involving multiple sites with positive rheumatoid factor (HCC) 10/31/2017    Priority: High   Immunosuppressed status (HCC) 10/08/2017    Priority: High   Obesity with body mass index 30 or greater 01/25/2017    Priority: High   Current chronic use of systemic steroids 06/08/2016    Priority: High   Essential hypertension 10/20/2008    Priority: High   Mixed hyperlipidemia 04/23/2008    Priority: High   H/O partial resection of colon 01/31/2018    Priority: Medium    Leukocytosis 08/27/2014    Priority: Medium    Fatty liver disease, nonalcoholic 11/24/2010    Priority: Medium    Benign colon polyp 10/20/2008    Priority: Medium    Diverticulosis of colon 10/20/2008    Priority: Medium    Venous (peripheral) insufficiency 04/23/2008    Priority: Medium    Gastroesophageal reflux disease without esophagitis 04/23/2008    Priority: Medium    Bilateral sensorineural hearing loss 02/02/2019    Priority: Low   Uses hearing aid 12/09/2015    Priority: Low   Thrombocytopenia (HCC)  11/23/2013    Priority: Low   Allergic rhinitis 04/23/2008    Priority: Low   Screening for osteoporosis 10/09/2021   Current Meds  Medication Sig   acetaminophen (TYLENOL) 500 MG tablet Take 1,000 mg by mouth every 6 (six) hours as needed for moderate pain or headache.    aspirin EC 81 MG tablet Take 81 mg by mouth every Monday, Wednesday, and Friday. Swallow whole.   azithromycin (ZITHROMAX) 250 MG tablet Take 2 tabs today, then 1 tab daily for 4 days   Calcium Carbonate-Vitamin D 600-400 MG-UNIT tablet Take 1 tablet by mouth 2 (two) times daily.    Cholecalciferol (VITAMIN D) 50 MCG (2000 UT) CAPS Take 2,000 Units by mouth daily.    doxepin (SINEQUAN) 10 MG capsule Take 10 mg by mouth 2 (two) times daily.   fenofibrate 160 MG tablet TAKE 1 TABLET BY MOUTH EVERYDAY AT BEDTIME   fexofenadine (ALLEGRA) 180 MG tablet Take 1 tablet (180 mg total) by mouth daily.   fluticasone (FLONASE) 50 MCG/ACT nasal spray USE 2 SPRAYS IN EACH NOSTRIL DAILY AS NEEDED FOR ALLERGIES   furosemide (LASIX) 20 MG tablet Take 1 tablet (20 mg total) by mouth daily as needed for edema. Take with potassium   lisinopril (ZESTRIL) 20 MG tablet Take 1 tablet (20 mg total) by mouth daily.   Multiple Vitamin (MULTIVITAMIN)  tablet Take 1 tablet by mouth daily.   ondansetron (ZOFRAN) 4 MG tablet TAKE 1 TABLET BY MOUTH EVERY 6 HOURS AS NEEDED FOR NAUSEA   polyethylene glycol (MIRALAX) 17 g packet Take 17 g by mouth daily.   potassium chloride SA (KLOR-CON) 20 MEQ tablet Take 1 tablet (20 mEq total) by mouth daily as needed (leg swelling). Take with furosemide   predniSONE (DELTASONE) 5 MG tablet Take 5 mg by mouth daily.   simvastatin (ZOCOR) 40 MG tablet TAKE 1 TABLET BY MOUTH EVERYDAY AT BEDTIME   Urea 47 % CREA Apply 1 application topically daily as needed (irritation).    valACYclovir (VALTREX) 500 MG tablet Take 500 mg by mouth 2 (two) times daily.    Review of Systems: Cardiovascular: negative for chest  pain Respiratory: negative for SOB or persistent cough Gastrointestinal: negative for abdominal pain Genitourinary: negative for dysuria or gross hematuria  Objective  Vitals: BP (!) 148/76   Pulse 78   Temp 97.8 F (36.6 C)   Ht 5\' 4"  (1.626 m)   Wt 198 lb 3.2 oz (89.9 kg)   LMP  (LMP Unknown)   SpO2 98%   BMI 34.02 kg/m  General: no acute distress  Psych:  Alert and oriented, normal mood and affect HEENT:  Normocephalic, atraumatic, right TM with serous effusion or retraction w/o erythema, left TM is bulging and erythematous, nasal mucosa is red with purulent drainage, bilateral tender maxillary sinus present, OP mild erythematous w/o eudate, supple neck without LAD Cardiovascular:  RRR without murmur or gallop. no peripheral edema Respiratory:  Good breath sounds bilaterally, CTAB with normal respiratory effort Skin:  Warm, no rashes  COVID testing negative in office today  Assessment  1. Acute cough   2. Acute non-recurrent maxillary sinusitis   3. Left acute otitis media   4. Seasonal allergic rhinitis due to pollen   5. Immunosuppressed status (HCC)      Plan  Allergies with secondary infections as noted above Sinusitis:   Etiology and prognosis discussed with patient.  Recommend antibiotics as ordered below.  Patient to complete course of antibiotics, use supportive medications like mucolytics and decongestants as needed.  May use Tylenol or Advil if needed.  Symptoms should improve over the next 2 weeks.  Patient will return or call if symptoms persist or worsen.  Follow up: As needed  Commons side effects, risks, benefits, and alternatives for medications and treatment plan prescribed today were discussed, and the patient expressed understanding of the given instructions. Patient is instructed to call or message via MyChart if he/she has any questions or concerns regarding our treatment plan. No barriers to understanding were identified. We discussed Red Flag symptoms  and signs in detail. Patient expressed understanding regarding what to do in case of urgent or emergency type symptoms.  Medication list was reconciled, printed and provided to the patient in AVS. Patient instructions and summary information was reviewed with the patient as documented in the AVS. This note was prepared with assistance of Dragon voice recognition software. Occasional wrong-word or sound-a-like substitutions may have occurred due to the inherent limitations of voice recognition software  Orders Placed This Encounter  Procedures   POC COVID-19   Meds ordered this encounter  Medications   azithromycin (ZITHROMAX) 250 MG tablet    Sig: Take 2 tabs today, then 1 tab daily for 4 days    Dispense:  1 each    Refill:  0

## 2023-04-25 ENCOUNTER — Other Ambulatory Visit: Payer: Self-pay | Admitting: Family Medicine

## 2023-04-26 ENCOUNTER — Encounter: Payer: Self-pay | Admitting: Family Medicine

## 2023-04-26 MED ORDER — BENZONATATE 200 MG PO CAPS: 200.0000 mg | ORAL_CAPSULE | Freq: Two times a day (BID) | ORAL | 0 refills | Status: DC | PRN

## 2023-05-09 ENCOUNTER — Ambulatory Visit (INDEPENDENT_AMBULATORY_CARE_PROVIDER_SITE_OTHER): Payer: Medicare Other | Admitting: Podiatry

## 2023-05-09 DIAGNOSIS — M79675 Pain in left toe(s): Secondary | ICD-10-CM | POA: Diagnosis not present

## 2023-05-09 DIAGNOSIS — M79674 Pain in right toe(s): Secondary | ICD-10-CM | POA: Diagnosis not present

## 2023-05-09 DIAGNOSIS — B351 Tinea unguium: Secondary | ICD-10-CM

## 2023-05-09 MED ORDER — TERBINAFINE HCL 250 MG PO TABS: 250.0000 mg | ORAL_TABLET | Freq: Every day | ORAL | 0 refills | Status: AC

## 2023-05-13 NOTE — Progress Notes (Signed)
  Subjective:  Patient ID: Morgan [name], female    DOB: [birth year],  MRN: [id number]  Chief Complaint  Patient presents with   Nail Problem    NP- Right 3rd toe nail is coming off.    72 y.o. female presents with the above complaint. History confirmed with patient.  The remaining toenails are thickened elongated causing discomfort  Objective:  Physical Exam: warm, good capillary refill, no trophic changes or ulcerative lesions, normal DP and PT pulses, and normal sensory exam. Left Foot: dystrophic yellowed discolored nail plates with subungual debris Right Foot: dystrophic yellowed discolored nail plates with subungual debris and third toenail with onycholysis   Assessment:   1. Pain due to onychomycosis of toenails of both feet      Plan:  Patient was evaluated and treated and all questions answered.   Discussed the etiology and treatment options for the condition in detail with the patient. Educated patient on the topical and oral treatment options for mycotic nails. Recommended debridement of the nails today. Sharp and mechanical debridement performed of all painful and mycotic nails today. Nails debrided in length and thickness using a nail nipper to level of comfort. Discussed treatment options including appropriate shoe gear. Follow up as needed for painful nails.  She would like to be treated for the nail fungus.  Her last liver function testing was normal.  Recommended oral therapy.  Rx for Lamisil sent to pharmacy.  We discussed its possible side effects.    Return in about 4 months (around 09/08/2023) for follow up after nail fungus treatment.

## 2023-05-21 ENCOUNTER — Encounter: Payer: Self-pay | Admitting: Family Medicine

## 2023-06-13 ENCOUNTER — Encounter: Payer: Self-pay | Admitting: Family Medicine

## 2023-06-14 ENCOUNTER — Ambulatory Visit: Payer: Medicare Other | Admitting: Family

## 2023-06-14 MED ORDER — SULFAMETHOXAZOLE-TRIMETHOPRIM 800-160 MG PO TABS: 1.0000 | ORAL_TABLET | Freq: Two times a day (BID) | ORAL | 0 refills | Status: DC

## 2023-06-17 ENCOUNTER — Ambulatory Visit (INDEPENDENT_AMBULATORY_CARE_PROVIDER_SITE_OTHER): Payer: Medicare Other | Admitting: Family Medicine

## 2023-06-17 VITALS — BP 130/58 | HR 82 | Temp 98.1°F | Ht 64.0 in | Wt 193.2 lb

## 2023-06-17 DIAGNOSIS — D849 Immunodeficiency, unspecified: Secondary | ICD-10-CM

## 2023-06-17 DIAGNOSIS — L089 Local infection of the skin and subcutaneous tissue, unspecified: Secondary | ICD-10-CM

## 2023-06-17 DIAGNOSIS — T148XXA Other injury of unspecified body region, initial encounter: Secondary | ICD-10-CM

## 2023-06-17 DIAGNOSIS — L03115 Cellulitis of right lower limb: Secondary | ICD-10-CM

## 2023-06-17 DIAGNOSIS — Z7952 Long term (current) use of systemic steroids: Secondary | ICD-10-CM

## 2023-06-17 MED ORDER — TRIAMCINOLONE ACETONIDE 0.1 % EX CREA: 1.0000 | TOPICAL_CREAM | Freq: Two times a day (BID) | CUTANEOUS | 0 refills | Status: DC

## 2023-06-17 MED ORDER — SULFAMETHOXAZOLE-TRIMETHOPRIM 800-160 MG PO TABS: 1.0000 | ORAL_TABLET | Freq: Two times a day (BID) | ORAL | 0 refills | Status: DC

## 2023-06-17 NOTE — Progress Notes (Signed)
Subjective  CC:  Chief Complaint  Patient presents with   Leg Injury    Pt stated that she hit her leg on one of the workout machines at the gym on 06/07/23 and it tore the skin open    HPI: Morgan [name] is a 72 y.o. female who presents to the office today to address the problems listed above in the chief complaint. Leg wound: See last MyChart note.  On Septra DS twice daily.  Day 4 of antibiotics.  Fortunately, leg is looking better.  She described open wound with yellow drainage and significant surrounding erythema with pain.  Over the last 24 hours, redness has decreased and pain is improving.  No current systemic symptoms but she did have a low-grade fever prior to starting the antibiotics.  She has prednisone dermopathy and has history of wounds that are hard to heal.  No calf pain.  She is immunosuppressed  Assessment  1. Wound infection   2. Cellulitis of right lower extremity   3. Immunosuppressed status (HCC)   4. Current chronic use of systemic steroids      Plan  Wound infection cellulitis: Will continue Septra DS for 10 to 14 days.  Add steroid cream to lower extremity redness for inflammation.  Discussed wound care and bandages.  Infection does appear to be improving.  Will recheck in 3 to 4 days. Discussed preventative measures.  Follow up: 3 to 4 days for wound recheck 09/25/2023  No orders of the defined types were placed in this encounter.  Meds ordered this encounter  Medications   triamcinolone cream (KENALOG) 0.1 %    Sig: Apply 1 Application topically 2 (two) times daily. For 2 weeks, then as needed    Dispense:  45 g    Refill:  0   sulfamethoxazole-trimethoprim (BACTRIM DS) 800-160 MG tablet    Sig: Take 1 tablet by mouth 2 (two) times daily.    Dispense:  14 tablet    Refill:  0      I reviewed the patients updated PMH, FH, and SocHx.    Patient Active Problem List   Diagnosis Date Noted   Psoriasis 12/27/2020    Priority: High   Rheumatoid  arthritis involving multiple sites with positive rheumatoid factor (HCC) 10/31/2017    Priority: High   Immunosuppressed status (HCC) 10/08/2017    Priority: High   Obesity with body mass index 30 or greater 01/25/2017    Priority: High   Current chronic use of systemic steroids 06/08/2016    Priority: High   Essential hypertension 10/20/2008    Priority: High   Mixed hyperlipidemia 04/23/2008    Priority: High   H/O partial resection of colon 01/31/2018    Priority: Medium    Leukocytosis 08/27/2014    Priority: Medium    Fatty liver disease, nonalcoholic 11/24/2010    Priority: Medium    Benign colon polyp 10/20/2008    Priority: Medium    Diverticulosis of colon 10/20/2008    Priority: Medium    Venous (peripheral) insufficiency 04/23/2008    Priority: Medium    Gastroesophageal reflux disease without esophagitis 04/23/2008    Priority: Medium    Bilateral sensorineural hearing loss 02/02/2019    Priority: Low   Uses hearing aid 12/09/2015    Priority: Low   Thrombocytopenia (HCC) 11/23/2013    Priority: Low   Allergic rhinitis 04/23/2008    Priority: Low   Screening for osteoporosis 10/09/2021   Current Meds  Medication Sig   acetaminophen (TYLENOL) 500 MG tablet Take 1,000 mg by mouth every 6 (six) hours as needed for moderate pain or headache.    aspirin EC 81 MG tablet Take 81 mg by mouth every Monday, Wednesday, and Friday. Swallow whole.   Calcium Carbonate-Vitamin D 600-400 MG-UNIT tablet Take 1 tablet by mouth 2 (two) times daily.    Cholecalciferol (VITAMIN D) 50 MCG (2000 UT) CAPS Take 2,000 Units by mouth daily.    fenofibrate 160 MG tablet TAKE 1 TABLET BY MOUTH EVERYDAY AT BEDTIME   fexofenadine (ALLEGRA) 180 MG tablet Take 1 tablet (180 mg total) by mouth daily.   fluticasone (FLONASE) 50 MCG/ACT nasal spray USE 2 SPRAYS IN EACH NOSTRIL DAILY AS NEEDED FOR ALLERGIES   furosemide (LASIX) 20 MG tablet Take 1 tablet (20 mg total) by mouth daily as needed for  edema. Take with potassium   lisinopril (ZESTRIL) 20 MG tablet Take 1 tablet (20 mg total) by mouth daily.   Multiple Vitamin (MULTIVITAMIN) tablet Take 1 tablet by mouth daily.   ondansetron (ZOFRAN) 4 MG tablet TAKE 1 TABLET BY MOUTH EVERY 6 HOURS AS NEEDED FOR NAUSEA   polyethylene glycol (MIRALAX) 17 g packet Take 17 g by mouth daily.   potassium chloride SA (KLOR-CON) 20 MEQ tablet Take 1 tablet (20 mEq total) by mouth daily as needed (leg swelling). Take with furosemide   predniSONE (DELTASONE) 5 MG tablet Take 5 mg by mouth daily.   simvastatin (ZOCOR) 40 MG tablet TAKE 1 TABLET BY MOUTH EVERYDAY AT BEDTIME   terbinafine (LAMISIL) 250 MG tablet Take 1 tablet (250 mg total) by mouth daily.   triamcinolone cream (KENALOG) 0.1 % Apply 1 Application topically 2 (two) times daily. For 2 weeks, then as needed   valACYclovir (VALTREX) 500 MG tablet Take 500 mg by mouth 2 (two) times daily.   [DISCONTINUED] benzonatate (TESSALON) 200 MG capsule Take 1 capsule (200 mg total) by mouth 2 (two) times daily as needed for cough.   [DISCONTINUED] sulfamethoxazole-trimethoprim (BACTRIM DS) 800-160 MG tablet Take 1 tablet by mouth 2 (two) times daily.    Allergies: Patient is allergic to remicade [infliximab], adalimumab, bee venom, golimumab, hydroxychloroquine, poison ivy extract, secukinumab (300 mg dose), and vitamin e. Family History: Patient family history includes Arthritis in her mother; Atrial fibrillation in her mother; Breast cancer in an other family member; COPD in her mother; Diabetes in her maternal aunt, maternal uncle, and sister; Healthy in her sister; Heart disease in her mother and sister; Heart failure in her brother and son; Hyperlipidemia in her mother; Hypertension in her mother; Liver disease in her sister. Social History:  Patient  reports that she quit smoking about 21 years ago. Her smoking use included cigarettes. She started smoking about 36 years ago. She has a 7.5 pack-year  smoking history. She has never used smokeless tobacco. She reports current alcohol use. She reports that she does not use drugs.  Review of Systems: Constitutional: Negative for fever malaise or anorexia Cardiovascular: negative for chest pain Respiratory: negative for SOB or persistent cough Gastrointestinal: negative for abdominal pain  Objective  Vitals: BP (!) 130/58   Pulse 82   Temp 98.1 F (36.7 C)   Ht 5\' 4"  (1.626 m)   Wt 193 lb 3.2 oz (87.6 kg)   LMP  (LMP Unknown)   SpO2 98%   BMI 33.16 kg/m  General: no acute distress , A&Ox3 Right shin with approximately 2 inch open wound with granulation  tissue, no purulent drainage.  Surrounding erythema is present.  No calf tenderness or cords.  Commons side effects, risks, benefits, and alternatives for medications and treatment plan prescribed today were discussed, and the patient expressed understanding of the given instructions. Patient is instructed to call or message via MyChart if he/she has any questions or concerns regarding our treatment plan. No barriers to understanding were identified. We discussed Red Flag symptoms and signs in detail. Patient expressed understanding regarding what to do in case of urgent or emergency type symptoms.  Medication list was reconciled, printed and provided to the patient in AVS. Patient instructions and summary information was reviewed with the patient as documented in the AVS. This note was prepared with assistance of Dragon voice recognition software. Occasional wrong-word or sound-a-like substitutions may have occurred due to the inherent limitations of voice recognition software

## 2023-06-18 ENCOUNTER — Encounter: Payer: Self-pay | Admitting: Family Medicine

## 2023-06-21 ENCOUNTER — Ambulatory Visit: Payer: Medicare Other | Admitting: Family Medicine

## 2023-07-03 ENCOUNTER — Ambulatory Visit (INDEPENDENT_AMBULATORY_CARE_PROVIDER_SITE_OTHER): Payer: Medicare Other | Admitting: Family

## 2023-07-03 VITALS — BP 134/77 | HR 67 | Temp 97.8°F | Ht 64.0 in | Wt 191.0 lb

## 2023-07-03 DIAGNOSIS — N3001 Acute cystitis with hematuria: Secondary | ICD-10-CM | POA: Diagnosis not present

## 2023-07-03 LAB — POCT URINALYSIS DIPSTICK
Bilirubin, UA: NEGATIVE
Blood, UA: POSITIVE — AB
Glucose, UA: NEGATIVE
Ketones, UA: NEGATIVE
Nitrite, UA: POSITIVE — AB
Protein, UA: POSITIVE — AB
Spec Grav, UA: 1.015 (ref 1.010–1.025)
Urobilinogen, UA: 0.2 E.U./dL
pH, UA: 6 (ref 5.0–8.0)

## 2023-07-03 MED ORDER — NITROFURANTOIN MONOHYD MACRO 100 MG PO CAPS
100.0000 mg | ORAL_CAPSULE | Freq: Two times a day (BID) | ORAL | 0 refills | Status: DC
Start: 2023-07-03 — End: 2023-09-25

## 2023-07-03 NOTE — Progress Notes (Signed)
Patient ID: Morgan [name], female    DOB: [birth year], 72 y.o.   MRN: [id number]  Chief Complaint  Patient presents with   Urinary Frequency    sx for 3d    HPI:      Urinary sx:  Pt c/o urinary frequency, lower abdominal pain and dysuria, present for about days. Denies fever, low back pain, hematuria, vaginal discharge or irritation. Last UTI was years ago Denies recent illness or diarrhea.    Assessment & Plan:  1. Acute cystitis with hematuria sending Macrobid, advised on use & SE. Increase water intake to 2L daily. Sending urine for culture, will notify of results.  - POCT Urinalysis Dipstick - Urine Culture - nitrofurantoin, macrocrystal-monohydrate, (MACROBID) 100 MG capsule; Take 1 capsule (100 mg total) by mouth 2 (two) times daily after a meal.  Dispense: 10 capsule; Refill: 0   Subjective:    Outpatient Medications Prior to Visit  Medication Sig Dispense Refill   acetaminophen (TYLENOL) 500 MG tablet Take 1,000 mg by mouth every 6 (six) hours as needed for moderate pain or headache.      aspirin EC 81 MG tablet Take 81 mg by mouth every Monday, Wednesday, and Friday. Swallow whole.     Calcium Carbonate-Vitamin D 600-400 MG-UNIT tablet Take 1 tablet by mouth 2 (two) times daily.      Cholecalciferol (VITAMIN D) 50 MCG (2000 UT) CAPS Take 2,000 Units by mouth daily.      fenofibrate 160 MG tablet TAKE 1 TABLET BY MOUTH EVERYDAY AT BEDTIME 90 tablet 3   fexofenadine (ALLEGRA) 180 MG tablet Take 1 tablet (180 mg total) by mouth daily.     fluticasone (FLONASE) 50 MCG/ACT nasal spray USE 2 SPRAYS IN EACH NOSTRIL DAILY AS NEEDED FOR ALLERGIES 48 mL 2   furosemide (LASIX) 20 MG tablet Take 1 tablet (20 mg total) by mouth daily as needed for edema. Take with potassium 90 tablet 3   lisinopril (ZESTRIL) 20 MG tablet Take 1 tablet (20 mg total) by mouth daily. 90 tablet 3   Multiple Vitamin (MULTIVITAMIN) tablet Take 1 tablet by mouth daily.     ondansetron (ZOFRAN) 4 MG tablet  TAKE 1 TABLET BY MOUTH EVERY 6 HOURS AS NEEDED FOR NAUSEA 20 tablet 1   polyethylene glycol (MIRALAX) 17 g packet Take 17 g by mouth daily. 14 each 0   potassium chloride SA (KLOR-CON) 20 MEQ tablet Take 1 tablet (20 mEq total) by mouth daily as needed (leg swelling). Take with furosemide 90 tablet 3   predniSONE (DELTASONE) 5 MG tablet Take 5 mg by mouth daily.  3   simvastatin (ZOCOR) 40 MG tablet TAKE 1 TABLET BY MOUTH EVERYDAY AT BEDTIME 90 tablet 3   sulfamethoxazole-trimethoprim (BACTRIM DS) 800-160 MG tablet Take 1 tablet by mouth 2 (two) times daily. 14 tablet 0   terbinafine (LAMISIL) 250 MG tablet Take 1 tablet (250 mg total) by mouth daily. 90 tablet 0   triamcinolone cream (KENALOG) 0.1 % Apply 1 Application topically 2 (two) times daily. For 2 weeks, then as needed 45 g 0   valACYclovir (VALTREX) 500 MG tablet Take 500 mg by mouth 2 (two) times daily.     No facility-administered medications prior to visit.   Past Medical History:  Diagnosis Date   Abnormal chest x-ray    Abnormal electrocardiogram    Abscess of sigmoid colon due to diverticulitis 11/04/2017   Allergic rhinitis    Allergy    Cancer (HCC)  Skin -sqameous left hand basal cell face   Cataract    MD just watching   Colon polyp    polypoid colorectal mucosa/adenomatous   Complication of anesthesia    and cries   Contact dermatitis due to poison ivy    Cystitis    Diverticulitis with abscess status post robotic low anterior rectosigmoid resection 12/11/2017 12/11/2017   Diffuse diverticulosis entire colon by colonoscopy 01/2019   Diverticulosis of colon    Fatty liver disease, nonalcoholic    GERD (gastroesophageal reflux disease)    occasional   Hearing loss    bilateral, wears hearing aids   Hemorrhoids    Hyperlipidemia    Hypertension    Lymphocytosis    Overweight(278.02)    Pneumonia    2017   PONV (postoperative nausea and vomiting)    Psoriasis 12/27/2020   RA (rheumatoid arthritis) (HCC)     Rheumatoid   S/P reverse total shoulder arthroplasty, 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   Venous insufficiency    Past Surgical History:  Procedure Laterality Date   APPLICATION OF A-CELL OF EXTREMITY Left 06/09/2020   Procedure: APPLICATION OF INTEGRA BILAYER OR PRIMATRIX;  Surgeon: Allena Napoleon, MD;  Location: [facility] OR;  Service: Plastics;  Laterality: Left;   APPLICATION OF WOUND VAC Left 06/09/2020   Procedure: APPLICATION OF WOUND VAC;  Surgeon: Allena Napoleon, MD;  Location: [facility] OR;  Service: Plastics;  Laterality: Left;   APPLICATION OF WOUND VAC Left 07/26/2020   Procedure: APPLICATION OF WOUND VAC;  Surgeon: Allena Napoleon, MD;  Location: [facility] OR;  Service: Plastics;  Laterality: Left;  APPLICATION OF WOUND VAC   CESAREAN SECTION     [id number]   chemical and laser endovenous ablation of LE veins  2008, 2009, 2010, 2011   Dr Donia Ast et al   COLONOSCOPY     multiple   DRESSING CHANGE UNDER ANESTHESIA Right 04/24/2020   Procedure: DRESSING CHANGE UNDER ANESTHESIA right knee;  Surgeon: Eugenia Mcalpine, MD;  Location: [facility];  Service: Orthopedics;  Laterality: Right;   endoscopy     multiple   FOOT SURGERY     left   I & D EXTREMITY Left 06/09/2020   Procedure: Debridement of left knee wound;  Surgeon: Allena Napoleon, MD;  Location: [facility] OR;  Service: Plastics;  Laterality: Left;  total case 45 min   INCISION AND DRAINAGE Bilateral 04/22/2020   Procedure: INCISION AND DRAINAGE, WOUND VAC PLACEMENT ON LEFT KNEE;  Surgeon: Samson Frederic, MD;  Location: [facility];  Service: Orthopedics;  Laterality: Bilateral;   INCISION AND DRAINAGE ABSCESS Left 04/24/2020   Procedure: INCISION AND DRAINAGE LEFT KNEE;  Surgeon: Eugenia Mcalpine, MD;  Location: [facility];  Service: Orthopedics;  Laterality: Left;   INNER EAR SURGERY Right    childhood; right, ear drum repair   LAPAROSCOPIC LYSIS OF ADHESIONS N/A 12/11/2017   Procedure: LAPAROSCOPIC LYSIS OF ADHESIONS;  Surgeon: Karie Soda, MD;  Location: [facility];  Service:  General;  Laterality: N/A;   NASAL SEPTUM SURGERY     POLYPECTOMY     PROCTOSCOPY N/A 12/11/2017   Procedure: RIDGED PROCTOSCOPY;  Surgeon: Karie Soda, MD;  Location: [facility];  Service: General;  Laterality: N/A;   REVERSE SHOULDER ARTHROPLASTY Right 09/10/2019   Procedure: REVERSE SHOULDER ARTHROPLASTY;  Surgeon: Francena Hanly, MD;  Location: [facility];  Service: Orthopedics;  Laterality: Right;    SKIN SPLIT GRAFT Left 07/26/2020   Procedure: SKIN GRAFT SPLIT THICKNESS;  Surgeon: Allena Napoleon,  MD;  Location: [facility];  Service: Plastics;  Laterality: Left;  SKIN GRAFT SPLIT THICKNESS   TONSILLECTOMY     TOTAL ABDOMINAL HYSTERECTOMY  12/86   TYMPANOPLASTY Right 11/03/2018   Procedure: TYMPANOPLASTY;  Surgeon: Christia Reading, MD;  Location: [facility];  Service: ENT;  Laterality: Right;   Allergies  Allergen Reactions   Remicade [Infliximab] Anaphylaxis and Swelling    Facial swelling   Adalimumab     Other reaction(s): lost efficacy, Other (See Comments)   Bee Venom     Other reaction(s): Unknown   Golimumab     Other reaction(s): Other (See Comments), psoriasis   Hydroxychloroquine     Other Reaction(s): discoloration of skin   Poison Ivy Extract     Other reaction(s): Unknown   Secukinumab (300 Mg Dose)     Other Reaction(s): lost efficacy   Vitamin E     Other reaction(s): Other (See Comments)      Objective:    Physical Exam Vitals and nursing note reviewed.  Constitutional:      Appearance: Normal appearance.  Cardiovascular:     Rate and Rhythm: Normal rate and regular rhythm.  Pulmonary:     Effort: Pulmonary effort is normal.     Breath sounds: Normal breath sounds.  Musculoskeletal:        General: Normal range of motion.  Skin:    General: Skin is warm and dry.  Neurological:     Mental Status: She is alert.  Psychiatric:        Mood and Affect: Mood normal.        Behavior: Behavior normal.    BP 134/77 (BP Location: Left Arm, Patient  Position: Sitting, Cuff Size: Large)   Pulse 67   Temp 97.8 F (36.6 C) (Temporal)   Ht 5\' 4"  (1.626 m)   Wt 191 lb (86.6 kg)   LMP  (LMP Unknown)   SpO2 97%   BMI 32.79 kg/m  Wt Readings from Last 3 Encounters:  07/03/23 191 lb (86.6 kg)  06/17/23 193 lb 3.2 oz (87.6 kg)  04/01/23 198 lb 3.2 oz (89.9 kg)       Dulce Sellar, NP

## 2023-07-08 ENCOUNTER — Ambulatory Visit: Payer: Medicare Other | Admitting: Family Medicine

## 2023-07-16 ENCOUNTER — Ambulatory Visit: Payer: BC Managed Care – PPO | Admitting: Family Medicine

## 2023-08-09 ENCOUNTER — Other Ambulatory Visit (HOSPITAL_BASED_OUTPATIENT_CLINIC_OR_DEPARTMENT_OTHER): Payer: Self-pay

## 2023-08-09 MED ORDER — FLUAD 0.5 ML IM SUSY
0.5000 mL | PREFILLED_SYRINGE | Freq: Once | INTRAMUSCULAR | 0 refills | Status: AC
  Filled 2023-08-09: qty 0.5, 1d supply, fill #0

## 2023-08-10 ENCOUNTER — Encounter: Payer: Self-pay | Admitting: Family Medicine

## 2023-09-10 ENCOUNTER — Ambulatory Visit: Payer: Medicare Other | Admitting: Podiatry

## 2023-09-24 ENCOUNTER — Ambulatory Visit (INDEPENDENT_AMBULATORY_CARE_PROVIDER_SITE_OTHER): Payer: Medicare Other | Admitting: Podiatry

## 2023-09-24 DIAGNOSIS — M79675 Pain in left toe(s): Secondary | ICD-10-CM | POA: Diagnosis not present

## 2023-09-24 DIAGNOSIS — M79674 Pain in right toe(s): Secondary | ICD-10-CM | POA: Diagnosis not present

## 2023-09-24 DIAGNOSIS — B351 Tinea unguium: Secondary | ICD-10-CM | POA: Diagnosis not present

## 2023-09-24 MED ORDER — TERBINAFINE HCL 250 MG PO TABS: 250.0000 mg | ORAL_TABLET | Freq: Every day | ORAL | 0 refills | Status: DC

## 2023-09-24 NOTE — Progress Notes (Signed)
Subjective:  Patient ID: Morgan [name], female    DOB: 02-14-1951,  MRN: [id number]  No chief complaint on file.   72 y.o. female presents with the above complaint. History confirmed with patient.  She completed the course of Lamisil had no ill effects taking it  Objective:  Physical Exam: warm, good capillary refill, no trophic changes or ulcerative lesions, normal DP and PT pulses, and normal sensory exam. Left Foot: dystrophic yellowed discolored nail plates with subungual debris Right Foot: dystrophic yellowed discolored nail plates with subungual debris and third toenail with onycholysis   Assessment:   1. Pain due to onychomycosis of toenails of both feet      Plan:  Patient was evaluated and treated and all questions answered.   Discussed the etiology and treatment options for the condition in detail with the patient. Educated patient on the topical and oral treatment options for mycotic nails. Recommended debridement of the nails today. Sharp and mechanical debridement performed of all painful and mycotic nails today. Nails debrided in length and thickness using a nail nipper to level of comfort. Discussed treatment options including appropriate shoe gear. Follow up as needed for painful nails.  Has had improvement with 1 round of Lamisil.  Had no ill effects taking it, would like to continue.  Refill sent to pharmacy and I will reevaluate in 4 months    Return in about 4 months (around 01/25/2024) for painful thick fungal nails.

## 2023-09-25 ENCOUNTER — Encounter: Payer: Self-pay | Admitting: Family Medicine

## 2023-09-25 ENCOUNTER — Ambulatory Visit: Payer: Medicare Other | Admitting: Family Medicine

## 2023-09-25 ENCOUNTER — Encounter: Payer: Self-pay | Admitting: Podiatry

## 2023-09-25 VITALS — BP 124/68 | HR 67 | Temp 98.0°F | Ht 64.0 in | Wt 184.6 lb

## 2023-09-25 DIAGNOSIS — Z7952 Long term (current) use of systemic steroids: Secondary | ICD-10-CM

## 2023-09-25 DIAGNOSIS — M0579 Rheumatoid arthritis with rheumatoid factor of multiple sites without organ or systems involvement: Secondary | ICD-10-CM

## 2023-09-25 DIAGNOSIS — D849 Immunodeficiency, unspecified: Secondary | ICD-10-CM | POA: Diagnosis not present

## 2023-09-25 DIAGNOSIS — I872 Venous insufficiency (chronic) (peripheral): Secondary | ICD-10-CM

## 2023-09-25 DIAGNOSIS — I10 Essential (primary) hypertension: Secondary | ICD-10-CM

## 2023-09-25 DIAGNOSIS — J0100 Acute maxillary sinusitis, unspecified: Secondary | ICD-10-CM

## 2023-09-25 MED ORDER — AZITHROMYCIN 250 MG PO TABS: ORAL_TABLET | ORAL | 0 refills | Status: DC

## 2023-09-25 MED ORDER — SIMVASTATIN 40 MG PO TABS: 40.0000 mg | ORAL_TABLET | Freq: Every day | ORAL | 3 refills | Status: DC

## 2023-09-25 MED ORDER — TERBINAFINE HCL 250 MG PO TABS: 250.0000 mg | ORAL_TABLET | Freq: Every day | ORAL | 0 refills | Status: DC

## 2023-09-25 NOTE — Progress Notes (Signed)
Subjective  CC:  Chief Complaint  Patient presents with   Hypertension    Pt also stated that she may have a sinus infection    HPI: Morgan [name] is a 72 y.o. female who presents to the office today to address the problems listed above in the chief complaint. Hypertension f/u: Control is good . Pt reports she is doing well. She checks home readings and always normal. 120s/60s. Has been getting higher readings at MD offices. Last week was high at rheum office. She denies adverse effects from his BP medications. Compliance with medication is good. Other chronic problems are stable.  C/o right sided facial pain, congestion, and pnd. X 4 days. Typical of her sinus infections. No chest congestion.    Assessment  1. Essential hypertension   2. Immunosuppressed status (HCC)   3. Current chronic use of systemic steroids   4. Rheumatoid arthritis involving multiple sites with positive rheumatoid factor (HCC)   5. Venous (peripheral) insufficiency   6. Acute non-recurrent maxillary sinusitis      Plan   Hypertension f/u: BP control is well controlled. By home readings. To bring in cuff next visit. No med changes today RA and treatment are stable.  No lasix needed at the moment. No edema.  Zpak for sinus infection  Education regarding management of these chronic disease states was given. Management strategies discussed on successive visits include dietary and exercise recommendations, goals of achieving and maintaining IBW, and lifestyle modifications aiming for adequate sleep and minimizing stressors.   Follow up: 6 mo for cpe  No orders of the defined types were placed in this encounter.  Meds ordered this encounter  Medications   simvastatin (ZOCOR) 40 MG tablet    Sig: Take 1 tablet (40 mg total) by mouth at bedtime.    Dispense:  90 tablet    Refill:  3   azithromycin (ZITHROMAX) 250 MG tablet    Sig: Take 2 tabs today, then 1 tab daily for 4 days    Dispense:  1 each     Refill:  0      BP Readings from Last 3 Encounters:  09/25/23 124/68  07/03/23 134/77  06/17/23 (!) 130/58   Wt Readings from Last 3 Encounters:  09/25/23 184 lb 9.6 oz (83.7 kg)  07/03/23 191 lb (86.6 kg)  06/17/23 193 lb 3.2 oz (87.6 kg)    Lab Results  Component Value Date   CHOL 144 01/15/2023   CHOL 119 01/11/2022   CHOL 131 12/27/2020   Lab Results  Component Value Date   HDL 53.10 01/15/2023   HDL 49.30 01/11/2022   HDL 51.90 12/27/2020   Lab Results  Component Value Date   LDLCALC 74 01/15/2023   LDLCALC 56 01/11/2022   LDLCALC 61 12/27/2020   Lab Results  Component Value Date   TRIG 83.0 01/15/2023   TRIG 68.0 01/11/2022   TRIG 89.0 12/27/2020   Lab Results  Component Value Date   CHOLHDL 3 01/15/2023   CHOLHDL 2 01/11/2022   CHOLHDL 3 12/27/2020   Lab Results  Component Value Date   LDLDIRECT 100.9 05/28/2007   LDLDIRECT 109.2 01/28/2007   Lab Results  Component Value Date   CREATININE 0.90 01/15/2023   BUN 24 (H) 01/15/2023   NA 138 01/15/2023   K 4.4 01/15/2023   CL 103 01/15/2023   CO2 27 01/15/2023    The 10-year ASCVD risk score (Arnett DK, et al., 2019) is: 17.3%  Values used to calculate the score:     Age: 32 years     Sex: Female     Is Non-Hispanic African American: No     Diabetic: No     Tobacco smoker: No     Systolic Blood Pressure: 124 mmHg     Is BP treated: Yes     HDL Cholesterol: 53.1 mg/dL     Total Cholesterol: 144 mg/dL  I reviewed the patients updated PMH, FH, and SocHx.    Patient Active Problem List   Diagnosis Date Noted   Psoriasis 12/27/2020    Priority: High   Rheumatoid arthritis involving multiple sites with positive rheumatoid factor (HCC) 10/31/2017    Priority: High   Immunosuppressed status (HCC) 10/08/2017    Priority: High   Obesity with body mass index 30 or greater 01/25/2017    Priority: High   Current chronic use of systemic steroids 06/08/2016    Priority: High   Essential  hypertension 10/20/2008    Priority: High   Mixed hyperlipidemia 04/23/2008    Priority: High   H/O partial resection of colon 01/31/2018    Priority: Medium    Leukocytosis 08/27/2014    Priority: Medium    Fatty liver disease, nonalcoholic 11/24/2010    Priority: Medium    Benign colon polyp 10/20/2008    Priority: Medium    Diverticulosis of colon 10/20/2008    Priority: Medium    Venous (peripheral) insufficiency 04/23/2008    Priority: Medium    Gastroesophageal reflux disease without esophagitis 04/23/2008    Priority: Medium    Bilateral sensorineural hearing loss 02/02/2019    Priority: Low   Uses hearing aid 12/09/2015    Priority: Low   Thrombocytopenia (HCC) 11/23/2013    Priority: Low   Allergic rhinitis 04/23/2008    Priority: Low   Screening for osteoporosis 10/09/2021    Allergies: Remicade [infliximab], Adalimumab, Bee venom, Golimumab, Hydroxychloroquine, Poison ivy extract, Secukinumab (300 mg dose), and Vitamin e  Social History: Patient  reports that she quit smoking about 21 years ago. Her smoking use included cigarettes. She started smoking about 36 years ago. She has a 7.5 pack-year smoking history. She has never used smokeless tobacco. She reports current alcohol use. She reports that she does not use drugs.  Current Meds  Medication Sig   acetaminophen (TYLENOL) 500 MG tablet Take 1,000 mg by mouth every 6 (six) hours as needed for moderate pain or headache.    aspirin EC 81 MG tablet Take 81 mg by mouth every Monday, Wednesday, and Friday. Swallow whole.   azithromycin (ZITHROMAX) 250 MG tablet Take 2 tabs today, then 1 tab daily for 4 days   Calcium Carbonate-Vitamin D 600-400 MG-UNIT tablet Take 1 tablet by mouth 2 (two) times daily.    Cholecalciferol (VITAMIN D) 50 MCG (2000 UT) CAPS Take 2,000 Units by mouth daily.    fenofibrate 160 MG tablet TAKE 1 TABLET BY MOUTH EVERYDAY AT BEDTIME   fexofenadine (ALLEGRA) 180 MG tablet Take 1 tablet (180  mg total) by mouth daily.   fluticasone (FLONASE) 50 MCG/ACT nasal spray USE 2 SPRAYS IN EACH NOSTRIL DAILY AS NEEDED FOR ALLERGIES   furosemide (LASIX) 20 MG tablet Take 1 tablet (20 mg total) by mouth daily as needed for edema. Take with potassium   lisinopril (ZESTRIL) 20 MG tablet Take 1 tablet (20 mg total) by mouth daily.   Multiple Vitamin (MULTIVITAMIN) tablet Take 1 tablet by mouth daily.   ondansetron (  ZOFRAN) 4 MG tablet TAKE 1 TABLET BY MOUTH EVERY 6 HOURS AS NEEDED FOR NAUSEA   polyethylene glycol (MIRALAX) 17 g packet Take 17 g by mouth daily.   potassium chloride SA (KLOR-CON) 20 MEQ tablet Take 1 tablet (20 mEq total) by mouth daily as needed (leg swelling). Take with furosemide   predniSONE (DELTASONE) 5 MG tablet Take 5 mg by mouth daily.   terbinafine (LAMISIL) 250 MG tablet Take 1 tablet (250 mg total) by mouth daily.   valACYclovir (VALTREX) 500 MG tablet Take 500 mg by mouth 2 (two) times daily.   [DISCONTINUED] simvastatin (ZOCOR) 40 MG tablet TAKE 1 TABLET BY MOUTH EVERYDAY AT BEDTIME    Review of Systems: Cardiovascular: negative for chest pain, palpitations, leg swelling, orthopnea Respiratory: negative for SOB, wheezing or persistent cough Gastrointestinal: negative for abdominal pain Genitourinary: negative for dysuria or gross hematuria  Objective  Vitals: BP 124/68 Comment: by home readings  Pulse 67   Temp 98 F (36.7 C)   Ht 5\' 4"  (1.626 m)   Wt 184 lb 9.6 oz (83.7 kg)   LMP  (LMP Unknown)   SpO2 98%   BMI 31.69 kg/m  General: no acute distress  Psych:  Alert and oriented, normal mood and affect HEENT:  Normocephalic, atraumatic, supple neck , + right facial pain Cardiovascular:  RRR without murmur. no edema Respiratory:  Good breath sounds bilaterally, CTAB with normal respiratory effort Skin:  Warm, no rashes Neurologic:   Mental status is normal Commons side effects, risks, benefits, and alternatives for medications and treatment plan  prescribed today were discussed, and the patient expressed understanding of the given instructions. Patient is instructed to call or message via MyChart if he/she has any questions or concerns regarding our treatment plan. No barriers to understanding were identified. We discussed Red Flag symptoms and signs in detail. Patient expressed understanding regarding what to do in case of urgent or emergency type symptoms.  Medication list was reconciled, printed and provided to the patient in AVS. Patient instructions and summary information was reviewed with the patient as documented in the AVS. This note was prepared with assistance of Dragon voice recognition software. Occasional wrong-word or sound-a-like substitutions may have occurred due to the inherent limitation

## 2023-09-25 NOTE — Patient Instructions (Signed)
Please return in 6 months for your annual complete physical; please come fasting.   If you have any questions or concerns, please don't hesitate to send me a message via MyChart or call the office at [phone]. Thank you for visiting with us today! It's our pleasure caring for you.  

## 2023-09-27 MED ORDER — TERBINAFINE HCL 250 MG PO TABS: 250.0000 mg | ORAL_TABLET | Freq: Every day | ORAL | 0 refills | Status: AC

## 2023-09-27 NOTE — Addendum Note (Signed)
Addended byLilian Kapur, Ledia Hanford R on: 09/27/2023 12:51 PM   Modules accepted: Orders

## 2023-10-23 NOTE — Telephone Encounter (Signed)
Error

## 2023-10-28 ENCOUNTER — Other Ambulatory Visit: Payer: Self-pay | Admitting: Family Medicine

## 2023-10-28 DIAGNOSIS — B9689 Other specified bacterial agents as the cause of diseases classified elsewhere: Secondary | ICD-10-CM

## 2023-12-03 ENCOUNTER — Other Ambulatory Visit: Payer: Self-pay | Admitting: Family Medicine

## 2023-12-06 ENCOUNTER — Encounter: Payer: Self-pay | Admitting: Family Medicine

## 2023-12-06 MED ORDER — BENZONATATE 200 MG PO CAPS: 200.0000 mg | ORAL_CAPSULE | Freq: Two times a day (BID) | ORAL | 0 refills | Status: DC | PRN

## 2024-01-21 ENCOUNTER — Encounter: Payer: Self-pay | Admitting: Podiatry

## 2024-01-21 ENCOUNTER — Ambulatory Visit (INDEPENDENT_AMBULATORY_CARE_PROVIDER_SITE_OTHER): Payer: Medicare Other | Admitting: Podiatry

## 2024-01-21 VITALS — Ht 64.0 in | Wt 184.6 lb

## 2024-01-21 DIAGNOSIS — M79675 Pain in left toe(s): Secondary | ICD-10-CM | POA: Diagnosis not present

## 2024-01-21 DIAGNOSIS — B351 Tinea unguium: Secondary | ICD-10-CM | POA: Diagnosis not present

## 2024-01-21 DIAGNOSIS — M79674 Pain in right toe(s): Secondary | ICD-10-CM

## 2024-01-22 NOTE — Progress Notes (Signed)
  Subjective:  Patient ID: Morgan [name], female    DOB: August 17, 1951,  MRN: [id number]  Chief Complaint  Patient presents with   Nail Problem    Pt is here to f/u on nail fungus states she finished the medication that was prescribe on her last visit.    73 y.o. female presents with the above complaint. History confirmed with patient.  She has completed the most recent round of Lamisil  Objective:  Physical Exam: warm, good capillary refill, no trophic changes or ulcerative lesions, normal DP and PT pulses, and normal sensory exam.  Distally there is still dystrophy and yellow and subungual debris but proximal clearing noted on most toenails    Assessment:   1. Pain due to onychomycosis of toenails of both feet      Plan:  Patient was evaluated and treated and all questions answered.   Discussed the etiology and treatment options for the condition in detail with the patient.  Has improved with Lamisil therapy do not see need for further oral therapy at this point. Recommended debridement of the nails today. Sharp and mechanical debridement performed of all painful and mycotic nails today. Nails debrided in length and thickness using a nail nipper to level of comfort.      Return in about 4 months (around 05/20/2024) for painful thick fungal nails.

## 2024-02-03 ENCOUNTER — Other Ambulatory Visit: Payer: Self-pay | Admitting: Family Medicine

## 2024-02-03 DIAGNOSIS — Z1231 Encounter for screening mammogram for malignant neoplasm of breast: Secondary | ICD-10-CM

## 2024-02-04 ENCOUNTER — Encounter: Payer: Self-pay | Admitting: Internal Medicine

## 2024-02-06 ENCOUNTER — Ambulatory Visit
Admission: RE | Admit: 2024-02-06 | Discharge: 2024-02-06 | Disposition: A | Discharge status: Home / Self Care | Source: Ambulatory Visit | Attending: Family Medicine | Admitting: Family Medicine

## 2024-02-06 DIAGNOSIS — Z1231 Encounter for screening mammogram for malignant neoplasm of breast: Secondary | ICD-10-CM

## 2024-02-19 ENCOUNTER — Encounter: Payer: Self-pay | Admitting: Family Medicine

## 2024-02-27 ENCOUNTER — Ambulatory Visit (AMBULATORY_SURGERY_CENTER): Admitting: *Deleted

## 2024-02-27 VITALS — Ht 64.0 in | Wt 178.0 lb

## 2024-02-27 DIAGNOSIS — Z860100 Personal history of colon polyps, unspecified: Secondary | ICD-10-CM

## 2024-02-27 MED ORDER — NA SULFATE-K SULFATE-MG SULF 17.5-3.13-1.6 GM/177ML PO SOLN: 1.0000 | Freq: Once | ORAL | 0 refills | Status: AC

## 2024-02-27 NOTE — Progress Notes (Signed)
 Pre visit completed over telephone. Instructions forwarded through MyChart and mailed to confirmed home address.    No egg or soy allergy known to patient  No issues known to pt with past sedation with any surgeries or procedures Patient denies ever being told they had issues or difficulty with intubation  No FH of Malignant Hyperthermia Pt is not on diet pills Pt is not on  home 02  Pt is not on blood thinners  Pt denies issues with constipation  No A fib or A flutter Have any cardiac testing pending--no Pt instructed to use Single[employer][email or web address] or GoodRx for a price reduction on prep

## 2024-02-29 ENCOUNTER — Other Ambulatory Visit: Payer: Self-pay | Admitting: Family Medicine

## 2024-03-12 ENCOUNTER — Ambulatory Visit (AMBULATORY_SURGERY_CENTER): Admitting: Internal Medicine

## 2024-03-12 ENCOUNTER — Encounter: Payer: Self-pay | Admitting: Internal Medicine

## 2024-03-12 VITALS — BP 132/66 | HR 64 | Temp 97.6°F | Resp 13 | Ht 64.0 in | Wt 178.0 lb

## 2024-03-12 DIAGNOSIS — K648 Other hemorrhoids: Secondary | ICD-10-CM | POA: Diagnosis not present

## 2024-03-12 DIAGNOSIS — D123 Benign neoplasm of transverse colon: Secondary | ICD-10-CM

## 2024-03-12 DIAGNOSIS — K635 Polyp of colon: Secondary | ICD-10-CM

## 2024-03-12 DIAGNOSIS — K5730 Diverticulosis of large intestine without perforation or abscess without bleeding: Secondary | ICD-10-CM

## 2024-03-12 DIAGNOSIS — Z1211 Encounter for screening for malignant neoplasm of colon: Secondary | ICD-10-CM

## 2024-03-12 DIAGNOSIS — Z860100 Personal history of colon polyps, unspecified: Secondary | ICD-10-CM

## 2024-03-12 DIAGNOSIS — Z860101 Personal history of adenomatous and serrated colon polyps: Secondary | ICD-10-CM

## 2024-03-12 DIAGNOSIS — Z980 Intestinal bypass and anastomosis status: Secondary | ICD-10-CM

## 2024-03-12 MED ORDER — SODIUM CHLORIDE 0.9 % IV SOLN: 500.0000 mL | Freq: Once | INTRAVENOUS | Status: DC

## 2024-03-12 NOTE — Progress Notes (Signed)
 Pt's states no medical or surgical changes since previsit or office visit.

## 2024-03-12 NOTE — Patient Instructions (Signed)
-  Handout on polyps, diverticulosis and hemorrhoids provided -await pathology results   -Continue present medications    YOU HAD AN ENDOSCOPIC PROCEDURE TODAY AT THE [facility]:   Refer to the procedure report that was given to you for any specific questions about what was found during the examination.  If the procedure report does not answer your questions, please call your gastroenterologist to clarify.  If you requested that your care partner not be given the details of your procedure findings, then the procedure report has been included in a sealed envelope for you to review at your convenience later.  YOU SHOULD EXPECT: Some feelings of bloating in the abdomen. Passage of more gas than usual.  Walking can help get rid of the air that was put into your GI tract during the procedure and reduce the bloating. If you had a lower endoscopy (such as a colonoscopy or flexible sigmoidoscopy) you may notice spotting of blood in your stool or on the toilet paper. If you underwent a bowel prep for your procedure, you may not have a normal bowel movement for a few days.  Please Note:  You might notice some irritation and congestion in your nose or some drainage.  This is from the oxygen used during your procedure.  There is no need for concern and it should clear up in a day or so.  SYMPTOMS TO REPORT IMMEDIATELY:  Following lower endoscopy (colonoscopy or flexible sigmoidoscopy):  Excessive amounts of blood in the stool  Significant tenderness or worsening of abdominal pains  Swelling of the abdomen that is new, acute  Fever of 100F or higher  For urgent or emergent issues, a gastroenterologist can be reached at any hour by calling [phone]. Do not use MyChart messaging for urgent concerns.    DIET:  We do recommend a small meal at first, but then you may proceed to your regular diet.  Drink plenty of fluids but you should avoid alcoholic beverages for 24 hours.  ACTIVITY:  You  should plan to take it easy for the rest of today and you should NOT DRIVE or use heavy machinery until tomorrow (because of the sedation medicines used during the test).    FOLLOW UP: Our staff will call the number listed on your records the next business day following your procedure.  We will call around 7:15- 8:00 am to check on you and address any questions or concerns that you may have regarding the information given to you following your procedure. If we do not reach you, we will leave a message.     If any biopsies were taken you will be contacted by phone or by letter within the next 1-3 weeks.  Please call us at [phone] if you have not heard about the biopsies in 3 weeks.    SIGNATURES/CONFIDENTIALITY: You and/or your care partner have signed paperwork which will be entered into your electronic medical record.  These signatures attest to the fact that that the information above on your After Visit Summary has been reviewed and is understood.  Full responsibility of the confidentiality of this discharge information lies with you and/or your care-partner.

## 2024-03-12 NOTE — Progress Notes (Signed)
 HISTORY OF PRESENT ILLNESS:  Morgan [name] is a 73 y.o. female with personal history of multiple adenomatous colon polyps.  Also sigmoid resection for diverticular disease.  Now for surveillance colonoscopy  REVIEW OF SYSTEMS:  All non-GI ROS negative except for  Past Medical History:  Diagnosis Date   Abnormal chest x-ray    Abnormal electrocardiogram    Abscess of sigmoid colon due to diverticulitis 11/04/2017   Allergic rhinitis    Allergy    Cancer (HCC)    Skin -sqameous left hand basal cell face   Cataract    MD just watching   Colon polyp    polypoid colorectal mucosa/adenomatous   Complication of anesthesia    and cries   Contact dermatitis due to poison ivy    Cystitis    Diverticulitis with abscess status post robotic low anterior rectosigmoid resection 12/11/2017 12/11/2017   Diffuse diverticulosis entire colon by colonoscopy 01/2019   Diverticulosis of colon    Fatty liver disease, nonalcoholic    GERD (gastroesophageal reflux disease)    occasional   Hearing loss    bilateral, wears hearing aids   Hemorrhoids    Hyperlipidemia    Hypertension    Lymphocytosis    Overweight(278.02)    Pneumonia    2017   PONV (postoperative nausea and vomiting)    Psoriasis 12/27/2020   RA (rheumatoid arthritis) (HCC)    Rheumatoid   S/P reverse total shoulder arthroplasty, 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   Venous insufficiency     Past Surgical History:  Procedure Laterality Date   APPLICATION OF A-CELL OF EXTREMITY Left 06/09/2020   Procedure: APPLICATION OF INTEGRA BILAYER OR PRIMATRIX;  Surgeon: Allena Napoleon, MD;  Location: [facility] OR;  Service: Plastics;  Laterality: Left;   APPLICATION OF WOUND VAC Left 06/09/2020   Procedure: APPLICATION OF WOUND VAC;  Surgeon: Allena Napoleon, MD;  Location: [facility] OR;  Service: Plastics;  Laterality: Left;   APPLICATION OF WOUND VAC Left 07/26/2020   Procedure: APPLICATION OF WOUND VAC;  Surgeon: Allena Napoleon, MD;  Location: [facility] OR;  Service:  Plastics;  Laterality: Left;  APPLICATION OF WOUND VAC   CATARACT EXTRACTION W/ INTRAOCULAR LENS IMPLANT Bilateral 2022   CESAREAN SECTION     [phone]   chemical and laser endovenous ablation of LE veins  2008, 2009, 2010, 2011   Dr Donia Ast et al   COLONOSCOPY     multiple   DRESSING CHANGE UNDER ANESTHESIA Right 04/24/2020   Procedure: DRESSING CHANGE UNDER ANESTHESIA right knee;  Surgeon: Eugenia Mcalpine, MD;  Location: [facility];  Service: Orthopedics;  Laterality: Right;   endoscopy     multiple   FOOT SURGERY     left   I & D EXTREMITY Left 06/09/2020   Procedure: Debridement of left knee wound;  Surgeon: Allena Napoleon, MD;  Location: [facility] OR;  Service: Plastics;  Laterality: Left;  total case 45 min   INCISION AND DRAINAGE Bilateral 04/22/2020   Procedure: INCISION AND DRAINAGE, WOUND VAC PLACEMENT ON LEFT KNEE;  Surgeon: Samson Frederic, MD;  Location: [facility];  Service: Orthopedics;  Laterality: Bilateral;   INCISION AND DRAINAGE ABSCESS Left 04/24/2020   Procedure: INCISION AND DRAINAGE LEFT KNEE;  Surgeon: Eugenia Mcalpine, MD;  Location: [facility];  Service: Orthopedics;  Laterality: Left;   INNER EAR SURGERY Right    childhood; right, ear drum repair   LAPAROSCOPIC LYSIS OF ADHESIONS N/A 12/11/2017   Procedure: LAPAROSCOPIC LYSIS OF ADHESIONS;  Surgeon: Michaell Cowing,  Viviann Spare, MD;  Location: [facility];  Service: General;  Laterality: N/A;   NASAL SEPTUM SURGERY     POLYPECTOMY     PROCTOSCOPY N/A 12/11/2017   Procedure: RIDGED PROCTOSCOPY;  Surgeon: Karie Soda, MD;  Location: [facility];  Service: General;  Laterality: N/A;   REVERSE SHOULDER ARTHROPLASTY Right 09/10/2019   Procedure: REVERSE SHOULDER ARTHROPLASTY;  Surgeon: Francena Hanly, MD;  Location: [facility];  Service: Orthopedics;  Laterality: Right;    SKIN SPLIT GRAFT Left 07/26/2020   Procedure: SKIN GRAFT SPLIT THICKNESS;  Surgeon: Allena Napoleon, MD;  Location: [facility];  Service: Plastics;  Laterality: Left;  SKIN GRAFT  SPLIT THICKNESS   TONSILLECTOMY     TOTAL ABDOMINAL HYSTERECTOMY  11/1985   TYMPANOPLASTY Right 11/03/2018   Procedure: TYMPANOPLASTY;  Surgeon: Christia Reading, MD;  Location: [facility];  Service: ENT;  Laterality: Right;    Social History [name]  reports that she quit smoking about 22 years ago. Her smoking use included cigarettes. She started smoking about 37 years ago. She has a 7.5 pack-year smoking history. She has never used smokeless tobacco. She reports current alcohol use. She reports that she does not use drugs.  family history includes Arthritis in her mother; Atrial fibrillation in her mother; Breast cancer in an other family member; COPD in her mother; Colon cancer in an other family member; Colon polyps in an other family member; Diabetes in her maternal aunt, maternal uncle, and sister; Healthy in her sister; Heart disease in her mother and sister; Heart failure in her brother and son; Hyperlipidemia in her mother; Hypertension in her mother; Liver disease in her sister.  Allergies  Allergen Reactions   Remicade [Infliximab] Anaphylaxis and Swelling    Facial swelling   Bee Venom Other (See Comments)    Other reaction(s): swelling   Poison Ivy Extract Other (See Comments)    Other reaction(s): swelling   Adalimumab Other (See Comments)    Other reaction(s): lost efficacy, Other (See Comments)   Golimumab Other (See Comments)    Other reaction(s): Other (See Comments), psoriasis   Hydroxychloroquine Other (See Comments)    Other Reaction(s): discoloration of skin   Secukinumab (300 Mg Dose) Other (See Comments)    Other Reaction(s): lost efficacy       PHYSICAL EXAMINATION: Vital signs: BP (!) 142/80   Pulse 68   Temp 97.6 F (36.4 C)   Resp 17   Ht 5\' 4"  (1.626 m)   Wt 178 lb (80.7 kg)   LMP  (LMP Unknown)   SpO2 (!) 88%   BMI 30.55 kg/m  General: Well-developed, well-nourished, no acute distress HEENT: Sclerae are anicteric,  conjunctiva pink. Oral mucosa intact Lungs: Clear Heart: Regular Abdomen: soft, nontender, nondistended, no obvious ascites, no peritoneal signs, normal bowel sounds. No organomegaly. Extremities: No edema Psychiatric: alert and oriented x3. Cooperative     ASSESSMENT:  History of multiple adenomatous colon polyps   PLAN:  Surveillance colonoscopy

## 2024-03-12 NOTE — Op Note (Signed)
 [facility] Patient Name: Morgan [name] Procedure Date: 03/12/2024 11:29 AM MRN: [id number] Endoscopist: Wilhemina Bonito. Marina Goodell , MD, [id number] Age: 73 Referring MD:  Date of Birth: 12-Feb-1951 Gender: Female Account #: [id number] Procedure:                Colonoscopy with cold snare polypectomy x 1 Indications:              High risk colon cancer surveillance: Personal                            history of multiple (3 or more) adenomas. Previous                            examinations 2002, 2005, 2011, 2013, 2018, 2020.                            Status post sigmoid resection for diverticular                            disease Medicines:                Monitored Anesthesia Care Procedure:                Pre-Anesthesia Assessment:                           - Prior to the procedure, a History and Physical                            was performed, and patient medications and                            allergies were reviewed. The patient's tolerance of                            previous anesthesia was also reviewed. The risks                            and benefits of the procedure and the sedation                            options and risks were discussed with the patient.                            All questions were answered, and informed consent                            was obtained. Prior Anticoagulants: The patient has                            taken no anticoagulant or antiplatelet agents.                            After reviewing the risks and benefits, the patient  was deemed in satisfactory condition to undergo the                            procedure.                           After obtaining informed consent, the colonoscope                            was passed under direct vision. Throughout the                            procedure, the patient's blood pressure, pulse, and                            oxygen saturations were monitored  continuously. The                            Olympus Scope SN: [id number] was introduced through                            the anus and advanced to the the cecum, identified                            by appendiceal orifice and ileocecal valve. The                            ileocecal valve, appendiceal orifice, and rectum                            were photographed. The quality of the bowel                            preparation was excellent. The colonoscopy was                            performed without difficulty. The patient tolerated                            the procedure well. The bowel preparation used was                            SUPREP via split dose instruction. Scope In: 11:50:19 AM Scope Out: 12:01:15 PM Scope Withdrawal Time: 0 hours 8 minutes 39 seconds  Total Procedure Duration: 0 hours 10 minutes 56 seconds  Findings:                 Diverticula were found in the entire colon.                           A 2 mm polyp was found in the transverse colon. The                            polyp was removed with a cold snare. Resection and  retrieval were complete.                           Internal hemorrhoids were found during                            retroflexion. Rectosigmoid anastomosis unremarkable                           The exam was otherwise without abnormality on                            direct and retroflexion views. Complications:            No immediate complications. Estimated blood loss:                            None. Estimated Blood Loss:     Estimated blood loss: none. Impression:               - Diverticulosis in the entire examined colon.                            Status post sigmoid colectomy.                           - One 2 mm polyp in the transverse colon, removed                            with a cold snare. Resected and retrieved.                           - Internal hemorrhoids.                           - The  examination was otherwise normal on direct                            and retroflexion views. Recommendation:           - Repeat colonoscopy is not recommended for                            surveillance.                           - Patient has a contact number available for                            emergencies. The signs and symptoms of potential                            delayed complications were discussed with the                            patient. Return to normal activities tomorrow.  Written discharge instructions were provided to the                            patient.                           - Resume previous diet.                           - Continue present medications.                           - Await pathology results. Wilhemina Bonito. Marina Goodell, MD 03/12/2024 12:06:33 PM This report has been signed electronically.

## 2024-03-12 NOTE — Progress Notes (Signed)
 Sedate, gd SR, tolerated procedure well, VSS, report to RN

## 2024-03-12 NOTE — Progress Notes (Signed)
 Called to room to assist during endoscopic procedure.  Patient ID and intended procedure confirmed with present staff. Received instructions for my participation in the procedure from the performing physician.

## 2024-03-13 ENCOUNTER — Telehealth: Payer: Self-pay

## 2024-03-13 NOTE — Telephone Encounter (Signed)
 Follow up call to pt, lm for pt to call if having any difficulty with normal activities or eating and drinking.  Also to call if any other questions or concerns.

## 2024-03-17 ENCOUNTER — Encounter: Payer: Self-pay | Admitting: Internal Medicine

## 2024-03-17 LAB — SURGICAL PATHOLOGY

## 2024-03-25 ENCOUNTER — Ambulatory Visit: Payer: Medicare Other | Admitting: Family Medicine

## 2024-03-25 VITALS — BP 164/68 | HR 70 | Temp 97.7°F | Ht 64.0 in | Wt 180.6 lb

## 2024-03-25 DIAGNOSIS — K219 Gastro-esophageal reflux disease without esophagitis: Secondary | ICD-10-CM

## 2024-03-25 DIAGNOSIS — I10 Essential (primary) hypertension: Secondary | ICD-10-CM | POA: Diagnosis not present

## 2024-03-25 DIAGNOSIS — Z7952 Long term (current) use of systemic steroids: Secondary | ICD-10-CM

## 2024-03-25 DIAGNOSIS — M0579 Rheumatoid arthritis with rheumatoid factor of multiple sites without organ or systems involvement: Secondary | ICD-10-CM

## 2024-03-25 DIAGNOSIS — E782 Mixed hyperlipidemia: Secondary | ICD-10-CM

## 2024-03-25 LAB — CBC WITH DIFFERENTIAL/PLATELET
Basophils Absolute: 0 10*3/uL (ref 0.0–0.1)
Basophils Relative: 0.1 % (ref 0.0–3.0)
Eosinophils Absolute: 0 10*3/uL (ref 0.0–0.7)
Eosinophils Relative: 0.5 % (ref 0.0–5.0)
HCT: 36.8 % (ref 36.0–46.0)
Hemoglobin: 12.3 g/dL (ref 12.0–15.0)
Lymphocytes Relative: 13.3 % (ref 12.0–46.0)
Lymphs Abs: 1.1 10*3/uL (ref 0.7–4.0)
MCHC: 33.5 g/dL (ref 30.0–36.0)
MCV: 94.2 fl (ref 78.0–100.0)
Monocytes Absolute: 2.3 10*3/uL — ABNORMAL HIGH (ref 0.1–1.0)
Monocytes Relative: 27.1 % — ABNORMAL HIGH (ref 3.0–12.0)
Neutro Abs: 5.1 10*3/uL (ref 1.4–7.7)
Neutrophils Relative %: 59 % (ref 43.0–77.0)
Platelets: 75 10*3/uL — ABNORMAL LOW (ref 150.0–400.0)
RBC: 3.9 Mil/uL (ref 3.87–5.11)
RDW: 16.3 % — ABNORMAL HIGH (ref 11.5–15.5)
WBC: 8.6 10*3/uL (ref 4.0–10.5)

## 2024-03-25 LAB — COMPREHENSIVE METABOLIC PANEL WITH GFR
ALT: 26 U/L (ref 0–35)
AST: 41 U/L — ABNORMAL HIGH (ref 0–37)
Albumin: 4.4 g/dL (ref 3.5–5.2)
Alkaline Phosphatase: 42 U/L (ref 39–117)
BUN: 20 mg/dL (ref 6–23)
CO2: 26 meq/L (ref 19–32)
Calcium: 10.1 mg/dL (ref 8.4–10.5)
Chloride: 104 meq/L (ref 96–112)
Creatinine, Ser: 0.78 mg/dL (ref 0.40–1.20)
GFR: 74.55 mL/min (ref >60.00)
Glucose, Bld: 98 mg/dL (ref 70–99)
Potassium: 4 meq/L (ref 3.5–5.1)
Sodium: 138 meq/L (ref 135–145)
Total Bilirubin: 0.7 mg/dL (ref 0.2–1.2)
Total Protein: 7.7 g/dL (ref 6.0–8.3)

## 2024-03-25 LAB — TSH: TSH: 1.1 u[IU]/mL (ref 0.35–5.50)

## 2024-03-25 LAB — LIPID PANEL
Cholesterol: 127 mg/dL (ref 0–200)
HDL: 53.3 mg/dL (ref >39.00)
LDL Cholesterol: 59 mg/dL (ref 0–99)
NonHDL: 73.38
Total CHOL/HDL Ratio: 2
Triglycerides: 70 mg/dL (ref 0.0–149.0)
VLDL: 14 mg/dL (ref 0.0–40.0)

## 2024-03-25 MED ORDER — LISINOPRIL 40 MG PO TABS: 40.0000 mg | ORAL_TABLET | Freq: Every day | ORAL | 3 refills | Status: AC

## 2024-03-25 NOTE — Patient Instructions (Signed)
 Please return in 6 months for hypertension follow up.   I will release your lab results to you on your MyChart account with further instructions. You may see the results before I do, but when I review them I will send you a message with my report or have my assistant call you if things need to be discussed. Please reply to my message with any questions. Thank you!   If you have any questions or concerns, please don't hesitate to send me a message via MyChart or call the office at [phone]. Thank you for visiting with us  today! It's our pleasure caring for you.   VISIT SUMMARY:  Today, we discussed your blood pressure management and other health concerns. Your blood pressure readings have been consistently high, and we need to address this to prevent complications. We also reviewed your rheumatoid arthritis, psoriasis, dry eye syndrome, and the healing of your recent basal cell carcinoma excision.  YOUR PLAN:  -HYPERTENSION: Hypertension means high blood pressure. Your blood pressure remains elevated, so we are increasing your lisinopril  dose to 40 mg daily. Please obtain a new blood pressure cuff for more accurate readings. Report if your diastolic blood pressure drops below 40 or if you feel lightheaded. We will also do blood work to check your thyroid , kidney, liver, complete blood count, and cholesterol levels.  -RHEUMATOID ARTHRITIS WITH HAND PAIN: Rheumatoid arthritis is an autoimmune condition that causes joint pain and inflammation. Your hand pain persists, but your psoriasis is well-controlled. Continue your current management plan.  -PSORIASIS: Psoriasis is a skin condition that causes red, scaly patches. Your psoriasis is currently controlled with no new flare-ups. Continue your current management plan.  -DRY EYE SYNDROME: Dry eye syndrome means your eyes do not produce enough tears or the right quality of tears. You recently had a YAG capsulotomy and are using prednisone  eye drops,  which you will discontinue tomorrow as planned. Continue to follow up with your ophthalmologist.  -BASAL CELL CARCINOMA: Basal cell carcinoma is a type of skin cancer. You had a basal cell carcinoma removed from your nose three weeks ago, and it is healing well. You have a follow-up appointment with your surgeon scheduled.  We will recheck your fasting cholesterol to ensure it is at goal.  Please monitor your headaches and return if they persist.   INSTRUCTIONS:  Please follow up with the blood work as discussed. Obtain a new blood pressure cuff and monitor your blood pressure at home. Report any significant changes or symptoms as instructed. Continue with your current management plans for rheumatoid arthritis, psoriasis, and dry eye syndrome. Attend your follow-up appointment with your surgeon for the basal cell carcinoma excision.

## 2024-03-25 NOTE — Progress Notes (Signed)
 Subjective  CC:  Chief Complaint  Patient presents with   Annual Exam    Pt here for Annual Exam and is currently fasting    Hypertension    HPI: Morgan [name] is a 73 y.o. female who presents to the office today to address the problems listed above in the chief complaint. Discussed the use of AI scribe software for clinical note transcription with the patient, who gave verbal consent to proceed. HM: screens all current. Had colonoscopy 03/2024; reviewed one small polyp and tics. No further surveillance recommended per Dr. Elvin Hammer. Feeling well overall. Imms up to date  History of Present Illness   Morgan DRAUS "Carmelina Chinchilla" is a 73 year old female with hypertension who presents for blood pressure management.  She has consistently elevated blood pressure readings since her last visit, with home measurements typically in the 140s to low 150s systolic range. Her blood pressure today was 152/68 mmHg. She notes that her home blood pressure cuff is approximately ten years old, which may affect accuracy. She experiences daily morning headaches that usually resolve on their own or with Tylenol  if persistent into the afternoon. These headaches have been occurring for a couple of months. On lisinopril  20 daily. No cp. No stressors. No associated n/v or photophobia. No severe headaches.   She has a history of extreme dry eye and recently underwent a YAG capsulotomy on her left eye, followed by treatment for inflammation. She is currently using prednisone  eye drops, which she will discontinue tomorrow.  She has rheumatoid arthritis with associated hand pain but reports that her psoriasis is well-controlled with minimal breakouts. No chest pain, shortness of breath, or abdominal issues. She recently had a colonoscopy with one non-precancerous polyp removed.  RA: on methotrexate and pred chronically. Stable.   She reports good control of leg swelling and skin condition, with no need for Lasix  and no open  sores. She had a basal cell carcinoma removed from her nose three weeks ago, which is healing well. HLD on statin. GERD is stable.    Assessment  1. Essential hypertension   2. Current chronic use of systemic steroids   3. Mixed hyperlipidemia   4. Rheumatoid arthritis involving multiple sites with positive rheumatoid factor (HCC)   5. Gastroesophageal reflux disease without esophagitis      Plan  Assessment and Plan    Hypertension Blood pressure remains elevated despite current lisinopril  20 mg. Morning headaches may be related to hypertension. Requires accurate home monitoring. - Increase lisinopril  to 40 mg daily. - Obtain a new blood pressure cuff. - Instruct to report if diastolic BP <40 or if lightheaded. - Order blood work: thyroid , kidney, liver, CBC, cholesterol.  Rheumatoid arthritis with hand pain Hand pain persists, psoriasis component controlled.  Psoriasis Psoriasis controlled, no new flare-ups.  Dry eye syndrome Managed by ophthalmologist. Recent YAG capsulotomy with inflammation, on prednisone  drops.  Basal cell carcinoma Post-excision on nose healing well. Follow-up with surgeon scheduled.     HLD for recheck today.   Orders Placed This Encounter  Procedures   CBC with Differential/Platelet   Comprehensive metabolic panel with GFR   Lipid panel   TSH   Meds ordered this encounter  Medications   lisinopril  (ZESTRIL ) 40 MG tablet    Sig: Take 1 tablet (40 mg total) by mouth daily.    Dispense:  90 tablet    Refill:  3     I reviewed the patients updated PMH, FH, and SocHx.  Patient Active Problem List   Diagnosis Date Noted   Psoriasis 12/27/2020    Priority: High   Rheumatoid arthritis involving multiple sites with positive rheumatoid factor (HCC) 10/31/2017    Priority: High   Immunosuppressed status (HCC) 10/08/2017    Priority: High   Obesity with body mass index 30 or greater 01/25/2017    Priority: High   Current chronic use of  systemic steroids 06/08/2016    Priority: High   Essential hypertension 10/20/2008    Priority: High   Mixed hyperlipidemia 04/23/2008    Priority: High   H/O partial resection of colon 01/31/2018    Priority: Medium    Leukocytosis 08/27/2014    Priority: Medium    Fatty liver disease, nonalcoholic 11/24/2010    Priority: Medium    Benign colon polyp 10/20/2008    Priority: Medium    Diverticulosis of colon 10/20/2008    Priority: Medium    Venous (peripheral) insufficiency 04/23/2008    Priority: Medium    Gastroesophageal reflux disease without esophagitis 04/23/2008    Priority: Medium    Bilateral sensorineural hearing loss 02/02/2019    Priority: Low   Uses hearing aid 12/09/2015    Priority: Low   Thrombocytopenia (HCC) 11/23/2013    Priority: Low   Allergic rhinitis 04/23/2008    Priority: Low   Screening for osteoporosis 10/09/2021   Current Meds  Medication Sig   acetaminophen  (TYLENOL ) 500 MG tablet Take 1,000 mg by mouth every 6 (six) hours as needed for moderate pain or headache.    aspirin  EC 81 MG tablet Take 81 mg by mouth every Monday, Wednesday, and Friday. Swallow whole.   Calcium  Carbonate-Vitamin D  600-400 MG-UNIT tablet Take 1 tablet by mouth 2 (two) times daily.    certolizumab pegol (CIMZIA) 2 X 200 MG KIT as directed Subcutaneous   Cholecalciferol  (VITAMIN D ) 50 MCG (2000 UT) CAPS Take 2,000 Units by mouth daily.    clobetasol (TEMOVATE) 0.05 % external solution 1 application Externally Twice a day for 14 days   fenofibrate  160 MG tablet TAKE 1 TABLET BY MOUTH EVERYDAY AT BEDTIME   fexofenadine  (ALLEGRA ) 180 MG tablet Take 1 tablet (180 mg total) by mouth daily.   fluticasone  (FLONASE ) 50 MCG/ACT nasal spray SPRAY 2 SPRAYS INTO EACH NOSTRIL DAILY AS NEEDED FOR ALLERGIES   folic acid (FOLVITE) 1 MG tablet TAKE 1 TABLET BY MOUTH EVERY DAY FOR 90 DAYS Oral for 90 Days   furosemide  (LASIX ) 20 MG tablet Take 1 tablet (20 mg total) by mouth daily as needed  for edema. Take with potassium   HYDROcodone -acetaminophen  (NORCO/VICODIN) 5-325 MG tablet Take 1 tablet by mouth every 6 (six) hours as needed.   ibuprofen (ADVIL) 200 MG tablet 1 tablet as needed Orally every 6 hrs   methotrexate (RHEUMATREX) 2.5 MG tablet Take 15 mg by mouth once a week.   Multiple Vitamin (MULTIVITAMIN) tablet Take 1 tablet by mouth daily.   polyethylene glycol (MIRALAX ) 17 g packet Take 17 g by mouth daily.   potassium chloride  SA (KLOR-CON ) 20 MEQ tablet Take 1 tablet (20 mEq total) by mouth daily as needed (leg swelling). Take with furosemide    prednisoLONE acetate (PRED FORTE) 1 % ophthalmic suspension 1 drop 3 (three) times daily.   predniSONE  (DELTASONE ) 5 MG tablet Take 5 mg by mouth daily.   simvastatin  (ZOCOR ) 40 MG tablet Take 1 tablet (40 mg total) by mouth at bedtime.   tiZANidine  (ZANAFLEX ) 2 MG tablet TAKE 1-2 TABLETS (2-4  MG TOTAL) BY MOUTH EVERY 8 (EIGHT) HOURS AS NEEDED.   valACYclovir  (VALTREX ) 500 MG tablet Take 500 mg by mouth 2 (two) times daily.   [DISCONTINUED] benzonatate  (TESSALON ) 200 MG capsule Take 1 capsule (200 mg total) by mouth 2 (two) times daily as needed for cough.   [DISCONTINUED] lisinopril  (ZESTRIL ) 20 MG tablet Take 1 tablet (20 mg total) by mouth daily.   [DISCONTINUED] neomycin -polymyxin b -dexamethasone  (MAXITROL ) 3.5-10000-0.1 SUSP Place 1 drop into the left eye 4 (four) times daily.   [DISCONTINUED] ondansetron  (ZOFRAN ) 4 MG tablet TAKE 1 TABLET BY MOUTH EVERY 6 HOURS AS NEEDED FOR NAUSEA    Allergies: Patient is allergic to remicade [infliximab], bee venom, poison ivy extract, adalimumab, golimumab , hydroxychloroquine , and secukinumab  (300 mg dose). Family History: Patient family history includes Arthritis in her mother; Atrial fibrillation in her mother; Breast cancer in an other family member; COPD in her mother; Colon cancer in an other family member; Colon polyps in an other family member; Diabetes in her maternal aunt,  maternal uncle, and sister; Healthy in her sister; Heart disease in her mother and sister; Heart failure in her brother and son; Hyperlipidemia in her mother; Hypertension in her mother; Liver disease in her sister. Social History:  Patient  reports that she quit smoking about 22 years ago. Her smoking use included cigarettes. She started smoking about 37 years ago. She has a 7.5 pack-year smoking history. She has never used smokeless tobacco. She reports current alcohol  use. She reports that she does not use drugs.  Review of Systems: Constitutional: Negative for fever malaise or anorexia Cardiovascular: negative for chest pain Respiratory: negative for SOB or persistent cough Gastrointestinal: negative for abdominal pain  Objective  Vitals: BP (!) 164/68   Pulse 70   Temp 97.7 F (36.5 C)   Ht 5\' 4"  (1.626 m)   Wt 180 lb 9.6 oz (81.9 kg)   LMP  (LMP Unknown)   SpO2 97%   BMI 31.00 kg/m  General: no acute distress , A&Ox3 HEENT: PEERL, conjunctiva normal, neck is supple Cardiovascular:  RRR without murmur or gallop.  Respiratory:  Good breath sounds bilaterally, CTAB with normal respiratory effort Skin:  Warm, no rashes  Commons side effects, risks, benefits, and alternatives for medications and treatment plan prescribed today were discussed, and the patient expressed understanding of the given instructions. Patient is instructed to call or message via MyChart if he/she has any questions or concerns regarding our treatment plan. No barriers to understanding were identified. We discussed Red Flag symptoms and signs in detail. Patient expressed understanding regarding what to do in case of urgent or emergency type symptoms.  Medication list was reconciled, printed and provided to the patient in AVS. Patient instructions and summary information was reviewed with the patient as documented in the AVS. This note was prepared with assistance of Dragon voice recognition software. Occasional  wrong-word or sound-a-like substitutions may have occurred due to the inherent limitations of voice recognition software

## 2024-03-25 NOTE — Progress Notes (Deleted)
 Subjective  CC:  Chief Complaint  Patient presents with   Annual Exam    Pt here for Annual Exam and is currently fasting    Hypertension    HPI: Morgan [name] is a 73 y.o. female who presents to the office today to address the problems listed above in the chief complaint. Hypertension f/u: Control is {Desc; good/fair/poor:18582}. Pt reports she is doing well. {Hypertension and [facility] ros:5727}. ***She denies adverse effects from his BP medications. Compliance with medication is good.   Assessment  1. Essential hypertension   2. Current chronic use of systemic steroids   3. Mixed hyperlipidemia   4. Rheumatoid arthritis involving multiple sites with positive rheumatoid factor (HCC)   5. Gastroesophageal reflux disease without esophagitis      Plan   Hypertension f/u: BP control is {DESC; WELL/FAIRLY WELL/POORLY:18703} controlled. ***  Education regarding management of these chronic disease states was given. Management strategies discussed on successive visits include dietary and exercise recommendations, goals of achieving and maintaining IBW, and lifestyle modifications aiming for adequate sleep and minimizing stressors.   Follow up: ***  Orders Placed This Encounter  Procedures   CBC with Differential/Platelet   Comprehensive metabolic panel with GFR   Lipid panel   TSH   Meds ordered this encounter  Medications   lisinopril  (ZESTRIL ) 40 MG tablet    Sig: Take 1 tablet (40 mg total) by mouth daily.    Dispense:  90 tablet    Refill:  3      BP Readings from Last 3 Encounters:  03/25/24 (!) 164/68  03/12/24 132/66  09/25/23 124/68   Wt Readings from Last 3 Encounters:  03/25/24 180 lb 9.6 oz (81.9 kg)  03/12/24 178 lb (80.7 kg)  02/27/24 178 lb (80.7 kg)    Lab Results  Component Value Date   CHOL 144 01/15/2023   CHOL 119 01/11/2022   CHOL 131 12/27/2020   Lab Results  Component Value Date   HDL 53.10 01/15/2023   HDL 49.30 01/11/2022   HDL 51.90  12/27/2020   Lab Results  Component Value Date   LDLCALC 74 01/15/2023   LDLCALC 56 01/11/2022   LDLCALC 61 12/27/2020   Lab Results  Component Value Date   TRIG 83.0 01/15/2023   TRIG 68.0 01/11/2022   TRIG 89.0 12/27/2020   Lab Results  Component Value Date   CHOLHDL 3 01/15/2023   CHOLHDL 2 01/11/2022   CHOLHDL 3 12/27/2020   Lab Results  Component Value Date   LDLDIRECT 100.9 05/28/2007   LDLDIRECT 109.2 01/28/2007   Lab Results  Component Value Date   CREATININE 0.90 01/15/2023   BUN 24 (H) 01/15/2023   NA 138 01/15/2023   K 4.4 01/15/2023   CL 103 01/15/2023   CO2 27 01/15/2023    The 10-year ASCVD risk score (Arnett DK, et al., 2019) is: 28.3%   Values used to calculate the score:     Age: 9 years     Sex: Female     Is Non-Hispanic African American: No     Diabetic: No     Tobacco smoker: No     Systolic Blood Pressure: 164 mmHg     Is BP treated: Yes     HDL Cholesterol: 53.1 mg/dL     Total Cholesterol: 144 mg/dL  I reviewed the patients updated PMH, FH, and SocHx.    Patient Active Problem List   Diagnosis Date Noted   Psoriasis 12/27/2020  Priority: High   Rheumatoid arthritis involving multiple sites with positive rheumatoid factor (HCC) 10/31/2017    Priority: High   Immunosuppressed status (HCC) 10/08/2017    Priority: High   Obesity with body mass index 30 or greater 01/25/2017    Priority: High   Current chronic use of systemic steroids 06/08/2016    Priority: High   Essential hypertension 10/20/2008    Priority: High   Mixed hyperlipidemia 04/23/2008    Priority: High   H/O partial resection of colon 01/31/2018    Priority: Medium    Leukocytosis 08/27/2014    Priority: Medium    Fatty liver disease, nonalcoholic 11/24/2010    Priority: Medium    Benign colon polyp 10/20/2008    Priority: Medium    Diverticulosis of colon 10/20/2008    Priority: Medium    Venous (peripheral) insufficiency 04/23/2008    Priority: Medium     Gastroesophageal reflux disease without esophagitis 04/23/2008    Priority: Medium    Bilateral sensorineural hearing loss 02/02/2019    Priority: Low   Uses hearing aid 12/09/2015    Priority: Low   Thrombocytopenia (HCC) 11/23/2013    Priority: Low   Allergic rhinitis 04/23/2008    Priority: Low   Screening for osteoporosis 10/09/2021    Allergies: Remicade [infliximab], Bee venom, Poison ivy extract, Adalimumab, Golimumab , Hydroxychloroquine , and Secukinumab  (300 mg dose)  Social History: Patient  reports that she quit smoking about 22 years ago. Her smoking use included cigarettes. She started smoking about 37 years ago. She has a 7.5 pack-year smoking history. She has never used smokeless tobacco. She reports current alcohol  use. She reports that she does not use drugs.  Current Meds  Medication Sig   acetaminophen  (TYLENOL ) 500 MG tablet Take 1,000 mg by mouth every 6 (six) hours as needed for moderate pain or headache.    aspirin  EC 81 MG tablet Take 81 mg by mouth every Monday, Wednesday, and Friday. Swallow whole.   Calcium  Carbonate-Vitamin D  600-400 MG-UNIT tablet Take 1 tablet by mouth 2 (two) times daily.    certolizumab pegol (CIMZIA) 2 X 200 MG KIT as directed Subcutaneous   Cholecalciferol  (VITAMIN D ) 50 MCG (2000 UT) CAPS Take 2,000 Units by mouth daily.    clobetasol (TEMOVATE) 0.05 % external solution 1 application Externally Twice a day for 14 days   fenofibrate  160 MG tablet TAKE 1 TABLET BY MOUTH EVERYDAY AT BEDTIME   fexofenadine  (ALLEGRA ) 180 MG tablet Take 1 tablet (180 mg total) by mouth daily.   fluticasone  (FLONASE ) 50 MCG/ACT nasal spray SPRAY 2 SPRAYS INTO EACH NOSTRIL DAILY AS NEEDED FOR ALLERGIES   folic acid (FOLVITE) 1 MG tablet TAKE 1 TABLET BY MOUTH EVERY DAY FOR 90 DAYS Oral for 90 Days   furosemide  (LASIX ) 20 MG tablet Take 1 tablet (20 mg total) by mouth daily as needed for edema. Take with potassium   HYDROcodone -acetaminophen  (NORCO/VICODIN)  5-325 MG tablet Take 1 tablet by mouth every 6 (six) hours as needed.   ibuprofen (ADVIL) 200 MG tablet 1 tablet as needed Orally every 6 hrs   methotrexate (RHEUMATREX) 2.5 MG tablet Take 15 mg by mouth once a week.   Multiple Vitamin (MULTIVITAMIN) tablet Take 1 tablet by mouth daily.   polyethylene glycol (MIRALAX ) 17 g packet Take 17 g by mouth daily.   potassium chloride  SA (KLOR-CON ) 20 MEQ tablet Take 1 tablet (20 mEq total) by mouth daily as needed (leg swelling). Take with furosemide    prednisoLONE  acetate (PRED FORTE) 1 % ophthalmic suspension 1 drop 3 (three) times daily.   predniSONE  (DELTASONE ) 5 MG tablet Take 5 mg by mouth daily.   simvastatin  (ZOCOR ) 40 MG tablet Take 1 tablet (40 mg total) by mouth at bedtime.   tiZANidine  (ZANAFLEX ) 2 MG tablet TAKE 1-2 TABLETS (2-4 MG TOTAL) BY MOUTH EVERY 8 (EIGHT) HOURS AS NEEDED.   valACYclovir  (VALTREX ) 500 MG tablet Take 500 mg by mouth 2 (two) times daily.   [DISCONTINUED] benzonatate  (TESSALON ) 200 MG capsule Take 1 capsule (200 mg total) by mouth 2 (two) times daily as needed for cough.   [DISCONTINUED] lisinopril  (ZESTRIL ) 20 MG tablet Take 1 tablet (20 mg total) by mouth daily.   [DISCONTINUED] neomycin -polymyxin b -dexamethasone  (MAXITROL ) 3.5-10000-0.1 SUSP Place 1 drop into the left eye 4 (four) times daily.   [DISCONTINUED] ondansetron  (ZOFRAN ) 4 MG tablet TAKE 1 TABLET BY MOUTH EVERY 6 HOURS AS NEEDED FOR NAUSEA    Review of Systems: Cardiovascular: negative for chest pain, palpitations, leg swelling, orthopnea Respiratory: negative for SOB, wheezing or persistent cough Gastrointestinal: negative for abdominal pain Genitourinary: negative for dysuria or gross hematuria  Objective  Vitals: BP (!) 164/68   Pulse 70   Temp 97.7 F (36.5 C)   Ht 5\' 4"  (1.626 m)   Wt 180 lb 9.6 oz (81.9 kg)   LMP  (LMP Unknown)   SpO2 97%   BMI 31.00 kg/m  General: no acute distress  Psych:  Alert and oriented, normal mood and  affect HEENT:  Normocephalic, atraumatic, supple neck  Cardiovascular:  RRR without murmur. no edema Respiratory:  Good breath sounds bilaterally, CTAB with normal respiratory effort Skin:  Warm, no rashes Neurologic:   Mental status is normal Commons side effects, risks, benefits, and alternatives for medications and treatment plan prescribed today were discussed, and the patient expressed understanding of the given instructions. Patient is instructed to call or message via MyChart if he/she has any questions or concerns regarding our treatment plan. No barriers to understanding were identified. We discussed Red Flag symptoms and signs in detail. Patient expressed understanding regarding what to do in case of urgent or emergency type symptoms.  Medication list was reconciled, printed and provided to the patient in AVS. Patient instructions and summary information was reviewed with the patient as documented in the AVS. This note was prepared with assistance of Dragon voice recognition software. Occasional wrong-word or sound-a-like substitutions may have occurred due to the inherent limitation

## 2024-03-27 ENCOUNTER — Encounter: Payer: Self-pay | Admitting: Family Medicine

## 2024-03-27 NOTE — Progress Notes (Signed)
 See mychart note Dear Ms. Morgan [name], Your lab results are all stable. Your WBC is much improved currently. Your platelets remain low but not in the dangerous range. Your cholesterol is excellent. And your kidney function remains normal. I am glad you are doing well! Sincerely, Dr. Jonelle Neri

## 2024-04-22 ENCOUNTER — Ambulatory Visit (INDEPENDENT_AMBULATORY_CARE_PROVIDER_SITE_OTHER)

## 2024-04-22 VITALS — Ht 64.0 in | Wt 180.0 lb

## 2024-04-22 DIAGNOSIS — Z0000 Encounter for general adult medical examination without abnormal findings: Secondary | ICD-10-CM

## 2024-04-22 NOTE — Progress Notes (Signed)
 Subjective:   Morgan [name] is a 73 y.o. who presents for a Medicare Wellness preventive visit.  As a reminder, Annual Wellness Visits don't include a physical exam, and some assessments may be limited, especially if this visit is performed virtually. We may recommend an in-person follow-up visit with your provider if needed.  Visit Complete: Virtual I connected with  Morgan [name] on 04/22/24 by a video and audio enabled telemedicine application and verified that I am speaking with the correct person using two identifiers.  Patient Location: Home  Provider Location: Home Office  I discussed the limitations of evaluation and management by telemedicine. The patient expressed understanding and agreed to proceed.  Vital Signs: Because this visit was a virtual/telehealth visit, some criteria may be missing or patient reported. Any vitals not documented were not able to be obtained and vitals that have been documented are patient reported.    Persons Participating in Visit: Patient.  AWV Questionnaire: Yes: Patient Medicare AWV questionnaire was completed by the patient on 04/22/24; I have confirmed that all information answered by patient is correct and no changes since this date.  Cardiac Risk Factors include: advanced age (>89men, >24 women);dyslipidemia;hypertension;obesity (BMI >30kg/m2)     Objective:     Today's Vitals   04/22/24 0857  Weight: 180 lb (81.6 kg)  Height: 5\' 4"  (1.626 m)   Body mass index is 30.9 kg/m.     04/22/2024    9:05 AM 04/22/2024    8:59 AM 07/26/2020   11:01 AM 06/09/2020    6:40 AM 04/21/2020    2:30 PM 04/21/2020    2:11 PM 09/10/2019    2:00 PM  Advanced Directives  Does Patient Have a Medical Advance Directive? Yes Yes Yes Yes No Yes Yes  Type of Estate agent of [place];Living will Healthcare Power of [place];Living will Healthcare Power of [place];Living will Healthcare Power of [place];Living will  Healthcare Power  of [place];Living will Living will;Healthcare Power of Attorney  Does patient want to make changes to medical advance directive? No - Patient declined No - Patient declined No - Patient declined No - Patient declined   No - Patient declined  Copy of Healthcare Power of Attorney in Chart? Yes - validated most recent copy scanned in chart (See row information) Yes - validated most recent copy scanned in chart (See row information) No - copy requested No - copy requested     Would patient like information on creating a medical advance directive?   No - Patient declined  No - Patient declined      Current Medications (verified) Outpatient Encounter Medications as of 04/22/2024  Medication Sig   acetaminophen  (TYLENOL ) 500 MG tablet Take 1,000 mg by mouth every 6 (six) hours as needed for moderate pain or headache.    aspirin  EC 81 MG tablet Take 81 mg by mouth every Monday, Wednesday, and Friday. Swallow whole.   Calcium  Carbonate-Vitamin D  600-400 MG-UNIT tablet Take 1 tablet by mouth 2 (two) times daily.    certolizumab pegol (CIMZIA) 2 X 200 MG KIT as directed Subcutaneous   Cholecalciferol  (VITAMIN D ) 50 MCG (2000 UT) CAPS Take 2,000 Units by mouth daily.    fenofibrate  160 MG tablet TAKE 1 TABLET BY MOUTH EVERYDAY AT BEDTIME   fexofenadine  (ALLEGRA ) 180 MG tablet Take 1 tablet (180 mg total) by mouth daily.   fluticasone  (FLONASE ) 50 MCG/ACT nasal spray SPRAY 2 SPRAYS INTO EACH NOSTRIL DAILY AS NEEDED FOR ALLERGIES  folic acid (FOLVITE) 1 MG tablet TAKE 1 TABLET BY MOUTH EVERY DAY FOR 90 DAYS Oral for 90 Days   ibuprofen (ADVIL) 200 MG tablet 1 tablet as needed Orally every 6 hrs   lisinopril  (ZESTRIL ) 40 MG tablet Take 1 tablet (40 mg total) by mouth daily.   methotrexate (RHEUMATREX) 2.5 MG tablet Take 15 mg by mouth once a week.   Multiple Vitamin (MULTIVITAMIN) tablet Take 1 tablet by mouth daily.   polyethylene glycol (MIRALAX ) 17 g packet Take 17 g by mouth daily.   prednisoLONE  acetate (PRED FORTE) 1 % ophthalmic suspension 1 drop 3 (three) times daily.   predniSONE  (DELTASONE ) 5 MG tablet Take 5 mg by mouth daily.   simvastatin  (ZOCOR ) 40 MG tablet Take 1 tablet (40 mg total) by mouth at bedtime.   valACYclovir  (VALTREX ) 500 MG tablet Take 500 mg by mouth 2 (two) times daily.   clobetasol (TEMOVATE) 0.05 % external solution 1 application Externally Twice a day for 14 days (Patient not taking: Reported on 04/22/2024)   furosemide  (LASIX ) 20 MG tablet Take 1 tablet (20 mg total) by mouth daily as needed for edema. Take with potassium (Patient not taking: Reported on 04/22/2024)   potassium chloride  SA (KLOR-CON ) 20 MEQ tablet Take 1 tablet (20 mEq total) by mouth daily as needed (leg swelling). Take with furosemide  (Patient not taking: Reported on 04/22/2024)   [DISCONTINUED] HYDROcodone -acetaminophen  (NORCO/VICODIN) 5-325 MG tablet Take 1 tablet by mouth every 6 (six) hours as needed.   [DISCONTINUED] tiZANidine  (ZANAFLEX ) 2 MG tablet TAKE 1-2 TABLETS (2-4 MG TOTAL) BY MOUTH EVERY 8 (EIGHT) HOURS AS NEEDED.   No facility-administered encounter medications on file as of 04/22/2024.    Allergies (verified) Remicade [infliximab], Bee venom, Poison ivy extract, Adalimumab, Golimumab , Hydroxychloroquine , and Secukinumab  (300 mg dose)   History: Past Medical History:  Diagnosis Date   Abnormal chest x-ray    Abnormal electrocardiogram    Abscess of sigmoid colon due to diverticulitis 11/04/2017   Allergic rhinitis    Allergy    Cancer (HCC)    Skin -sqameous left hand basal cell face   Cataract    MD just watching   Colon polyp    polypoid colorectal mucosa/adenomatous   Complication of anesthesia    and cries   Contact dermatitis due to poison ivy    Cystitis    Diverticulitis with abscess status post robotic low anterior rectosigmoid resection 12/11/2017 12/11/2017   Diffuse diverticulosis entire colon by colonoscopy 01/2019   Diverticulosis of colon    Fatty  liver disease, nonalcoholic    GERD (gastroesophageal reflux disease)    occasional   Hearing loss    bilateral, wears hearing aids   Hemorrhoids    Hyperlipidemia    Hypertension    Lymphocytosis    Overweight(278.02)    Pneumonia    2017   PONV (postoperative nausea and vomiting)    Psoriasis 12/27/2020   RA (rheumatoid arthritis) (HCC)    Rheumatoid   S/P reverse total shoulder arthroplasty, 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   Venous insufficiency    Past Surgical History:  Procedure Laterality Date   APPLICATION OF A-CELL OF EXTREMITY Left 06/09/2020   Procedure: APPLICATION OF INTEGRA BILAYER OR PRIMATRIX;  Surgeon: Barb Bonito, MD;  Location: [facility] OR;  Service: Plastics;  Laterality: Left;   APPLICATION OF WOUND VAC Left 06/09/2020   Procedure: APPLICATION OF WOUND VAC;  Surgeon: Barb Bonito, MD;  Location: [facility] OR;  Service: Plastics;  Laterality: Left;  APPLICATION OF WOUND VAC Left 07/26/2020   Procedure: APPLICATION OF WOUND VAC;  Surgeon: Barb Bonito, MD;  Location: [facility] OR;  Service: Plastics;  Laterality: Left;  APPLICATION OF WOUND VAC   CATARACT EXTRACTION W/ INTRAOCULAR LENS IMPLANT Bilateral 2022   CESAREAN SECTION     [phone]   chemical and laser endovenous ablation of LE veins  2008, 2009, 2010, 2011   Dr Bill Budd et al   COLONOSCOPY     multiple   DRESSING CHANGE UNDER ANESTHESIA Right 04/24/2020   Procedure: DRESSING CHANGE UNDER ANESTHESIA right knee;  Surgeon: Genevie Kerns, MD;  Location: [facility];  Service: Orthopedics;  Laterality: Right;   endoscopy     multiple   FOOT SURGERY     left   I & D EXTREMITY Left 06/09/2020   Procedure: Debridement of left knee wound;  Surgeon: Barb Bonito, MD;  Location: [facility] OR;  Service: Plastics;  Laterality: Left;  total case 45 min   INCISION AND DRAINAGE Bilateral 04/22/2020   Procedure: INCISION AND DRAINAGE, WOUND VAC PLACEMENT ON LEFT KNEE;  Surgeon: Adonica Hoose, MD;  Location: [facility];  Service: Orthopedics;   Laterality: Bilateral;   INCISION AND DRAINAGE ABSCESS Left 04/24/2020   Procedure: INCISION AND DRAINAGE LEFT KNEE;  Surgeon: Genevie Kerns, MD;  Location: [facility];  Service: Orthopedics;  Laterality: Left;   INNER EAR SURGERY Right    childhood; right, ear drum repair   LAPAROSCOPIC LYSIS OF ADHESIONS N/A 12/11/2017   Procedure: LAPAROSCOPIC LYSIS OF ADHESIONS;  Surgeon: Candyce Champagne, MD;  Location: [facility];  Service: General;  Laterality: N/A;   NASAL SEPTUM SURGERY     POLYPECTOMY     PROCTOSCOPY N/A 12/11/2017   Procedure: RIDGED PROCTOSCOPY;  Surgeon: Candyce Champagne, MD;  Location: [facility];  Service: General;  Laterality: N/A;   REVERSE SHOULDER ARTHROPLASTY Right 09/10/2019   Procedure: REVERSE SHOULDER ARTHROPLASTY;  Surgeon: Ellard Gunning, MD;  Location: [facility];  Service: Orthopedics;  Laterality: Right;    SKIN SPLIT GRAFT Left 07/26/2020   Procedure: SKIN GRAFT SPLIT THICKNESS;  Surgeon: Barb Bonito, MD;  Location: [facility];  Service: Plastics;  Laterality: Left;  SKIN GRAFT SPLIT THICKNESS   TONSILLECTOMY     TOTAL ABDOMINAL HYSTERECTOMY  11/1985   TYMPANOPLASTY Right 11/03/2018   Procedure: TYMPANOPLASTY;  Surgeon: Virgina Grills, MD;  Location[facility];  Service: ENT;  Laterality: Right;   Family History  Problem Relation Age of Onset   Hyperlipidemia Mother    Hypertension Mother    COPD Mother    Arthritis Mother    Heart disease Mother    Atrial fibrillation Mother    Diabetes Sister    Healthy Sister    Heart disease Sister        sister #1   Liver disease Sister        sister #1 - hx of liver transplant 14+ yrs ago   Heart failure Brother    Diabetes Maternal Aunt        x 2   Diabetes Maternal Uncle    Heart failure Son    Colon polyps Other    Colon cancer Other    Breast cancer Other        half-sister   Esophageal cancer Neg Hx    Rectal cancer Neg Hx    Stomach cancer Neg Hx    Social History   Socioeconomic History    Marital status: Married  Spouse name: Jock Muller   Number of children: 4   Years of education: Not on file   Highest education level: Some college, no degree  Occupational History   Occupation: Adult nurse: [employer]  Tobacco Use   Smoking status: Former    Current packs/day: 0.00    Average packs/day: 0.5 packs/day for 15.0 years (7.5 ttl pk-yrs)    Types: Cigarettes    Start date: 12/03/1986    Quit date: 12/03/2001    Years since quitting: 22.4   Smokeless tobacco: Never  Vaping Use   Vaping status: Never Used  Substance and Sexual Activity   Alcohol  use: Yes    Comment: occasional   Drug use: No   Sexual activity: Not Currently    Birth control/protection: Surgical    Comment: Hysterectomy  Other Topics Concern   Not on file  Social History Narrative   Married to [place], with children and grandchildren. Large supportive family; banking, no tob or alcohol  or drug use   Social Drivers of Corporate investment banker Strain: Low Risk  (04/22/2024)   Overall Financial Resource Strain (CARDIA)    Difficulty of Paying Living Expenses: Not hard at all  Food Insecurity: No Food Insecurity (04/22/2024)   Hunger Vital Sign    Worried About Running Out of Food in the Last Year: Never true    Ran Out of Food in the Last Year: Never true  Transportation Needs: No Transportation Needs (04/22/2024)   PRAPARE - Administrator, Civil Service (Medical): No    Lack of Transportation (Non-Medical): No  Physical Activity: Sufficiently Active (04/22/2024)   Exercise Vital Sign    Days of Exercise per Week: 3 days    Minutes of Exercise per Session: 90 min  Recent Concern: Physical Activity - Insufficiently Active (03/25/2024)   Exercise Vital Sign    Days of Exercise per Week: 3 days    Minutes of Exercise per Session: 30 min  Stress: No Stress Concern Present (04/22/2024)   [employer] of Occupational Health - Occupational Stress Questionnaire    Feeling  of Stress : Not at all  Social Connections: Socially Integrated (04/22/2024)   Social Connection and Isolation Panel [NHANES]    Frequency of Communication with Friends and Family: Three times a week    Frequency of Social Gatherings with Friends and Family: Three times a week    Attends Religious Services: More than 4 times per year    Active Member of Clubs or Organizations: Yes    Attends Banker Meetings: 1 to 4 times per year    Marital Status: Married    Tobacco Counseling Counseling given: Not Answered    Clinical Intake:  Pre-visit preparation completed: Yes  Pain : No/denies pain     BMI - recorded: 30.9 Nutritional Status: BMI > 30  Obese Nutritional Risks: None Diabetes: No  Lab Results  Component Value Date   HGBA1C 5.4 12/11/2017     How often do you need to have someone help you when you read instructions, pamphlets, or other written materials from your doctor or pharmacy?: 1 - Never  Interpreter Needed?: No  Information entered by :: Lamont Pilsner, LPN   Activities of Daily Living     04/22/2024    8:46 AM  In your present state of health, do you have any difficulty performing the following activities:  Hearing? 1  Comment hearing aids  Vision? 0  Difficulty  concentrating or making decisions? 0  Walking or climbing stairs? 0  Dressing or bathing? 0  Doing errands, shopping? 0  Preparing Food and eating ? N  Using the Toilet? N  In the past six months, have you accidently leaked urine? N  Do you have problems with loss of bowel control? N  Managing your Medications? N  Managing your Finances? N  Housekeeping or managing your Housekeeping? N    Patient Care Team: Luevenia Saha, MD as PCP - General (Family Medicine) Candyce Champagne, MD as Consulting Physician (General Surgery) Tobin Forts, MD as Consulting Physician (Gastroenterology) Renna Cary, MD (Inactive) as Consulting Physician (Hematology and Oncology) Cory Dingwall, MD (Inactive) as Consulting Physician (Infectious Diseases) Stefan Edge, MD as Consulting Physician (Rheumatology) Ellard Gunning, MD as Consulting Physician (Orthopedic Surgery)  Indicate any recent Medical Services you may have received from other than [facility] providers in the past year (date may be approximate).     Assessment:    This is a routine wellness examination for Ia.  Hearing/Vision screen Hearing Screening - Comments:: Hearing aids  Vision Screening - Comments:: Wears rx glasses - up to date with routine eye exams with Dr Sandra Crouch at [facility]     Goals Addressed             This Visit's Progress    Patient Stated       Walk a mile without breaking in between        Depression Screen     04/22/2024    9:04 AM 03/25/2024   11:35 AM 09/25/2023   11:31 AM 06/17/2023   10:59 AM 03/27/2023    1:16 PM 01/15/2023    8:37 AM 08/03/2022   10:14 AM  PHQ 2/9 Scores  PHQ - 2 Score 0 0 0 0 0 0 0    Fall Risk     04/22/2024    8:46 AM 03/25/2024   11:35 AM 09/25/2023   11:31 AM 06/17/2023   10:59 AM 03/27/2023    1:16 PM  Fall Risk   Falls in the past year? 0 0 0 1 0  Number falls in past yr:  0 0 0 1  Injury with Fall? 0 0 0 0 0  Risk for fall due to : No Fall Risks No Fall Risks No Fall Risks  History of fall(s)  Follow up Falls prevention discussed Falls evaluation completed Falls evaluation completed Falls evaluation completed Falls evaluation completed    MEDICARE RISK AT HOME:  Medicare Risk at Home Any stairs in or around the home?: (Patient-Rptd) Yes If so, are there any without handrails?: (Patient-Rptd) Yes Home free of loose throw rugs in walkways, pet beds, electrical cords, etc?: (Patient-Rptd) Yes Adequate lighting in your home to reduce risk of falls?: (Patient-Rptd) Yes Life alert?: (Patient-Rptd) Yes Use of a cane, walker or w/c?: (Patient-Rptd) No Grab bars in the bathroom?: (Patient-Rptd) No Shower chair or bench in shower?:  (Patient-Rptd) No Elevated toilet seat or a handicapped toilet?: (Patient-Rptd) No  TIMED UP AND GO:  Was the test performed?  No  Cognitive Function: 6CIT completed        04/22/2024    9:05 AM 03/27/2023    1:16 PM  6CIT Screen  What Year? 0 points 0 points  What month? 0 points 0 points  What time? 0 points 0 points  Count back from 20 0 points 0 points  Months in reverse 0 points 0 points  Repeat phrase 0 points 0 points  Total Score 0 points 0 points    Immunizations Immunization History  Administered Date(s) Administered   Fluad Quad(high Dose 65+) 09/03/2016, 09/19/2017, 10/01/2019, 09/22/2020, 08/29/2022   Fluad Trivalent(High Dose 65+) 08/09/2023   Influenza Split 01/09/2012, 08/25/2012, 09/30/2013   Influenza Whole 12/12/2009   Influenza, High Dose Seasonal PF 09/03/2016, 09/19/2017, 09/11/2018   Influenza,inj,quad, With Preservative 09/02/2018, 08/04/2019   Influenza-Unspecified 08/03/2014, 08/04/2015, 08/03/2016, 09/16/2017, 09/13/2021   Moderna Covid-19 Vaccine  Bivalent Booster 67yrs & up 10/05/2021, 03/29/2022   Moderna SARS-COV2 Booster Vaccination 01/27/2021   Moderna Sars-Covid-2 Vaccination 01/01/2020, 01/29/2020, 08/19/2020   Pfizer Covid-19 Vaccine Bivalent Booster 64yrs & up 10/19/2022, 08/06/2023   Pfizer(Comirnaty )Fall Seasonal Vaccine 12 years and older 10/19/2022   Pneumococcal Conjugate-13 08/27/2014   Pneumococcal Polysaccharide-23 12/09/2015   Pneumococcal-Unspecified 08/03/2014, 12/04/2015   Respiratory Syncytial Virus Vaccine ,Recomb Aduvanted(Arexvy ) 11/07/2022   Tdap 12/09/2017, 03/19/2020    Screening Tests Health Maintenance  Topic Date Due   COVID-19 Vaccine (3 - Pfizer risk series) 09/03/2023   INFLUENZA VACCINE  07/03/2024   MAMMOGRAM  02/05/2025   Medicare Annual Wellness (AWV)  04/22/2025   DEXA SCAN  10/04/2026   DTaP/Tdap/Td (3 - Td or Tdap) 03/19/2030   Pneumonia Vaccine 63+ Years old  Completed   Hepatitis C Screening   Completed   HPV VACCINES  Aged Out   Meningococcal B Vaccine  Aged Out   Colonoscopy  Discontinued   Zoster Vaccines- Shingrix   Discontinued    Health Maintenance  Health Maintenance Due  Topic Date Due   COVID-19 Vaccine (3 - Pfizer risk series) 09/03/2023   Health Maintenance Items Addressed: See Nurse Notes  Additional Screening:  Vision Screening: Recommended annual ophthalmology exams for early detection of glaucoma and other disorders of the eye.  Dental Screening: Recommended annual dental exams for proper oral hygiene  Community Resource Referral / Chronic Care Management: CRR required this visit?  No   CCM required this visit?  No   Plan:    I have personally reviewed and noted the following in the patient's chart:   Medical and social history Use of alcohol , tobacco or illicit drugs  Current medications and supplements including opioid prescriptions. Patient is not currently taking opioid prescriptions. Functional ability and status Nutritional status Physical activity Advanced directives List of other physicians Hospitalizations, surgeries, and ER visits in previous 12 months Vitals Screenings to include cognitive, depression, and falls Referrals and appointments  In addition, I have reviewed and discussed with patient certain preventive protocols, quality metrics, and best practice recommendations. A written personalized care plan for preventive services as well as general preventive health recommendations were provided to patient.   Bruno Capri, LPN   [id number]/56/[id number]   After Visit Summary: (MyChart) Due to this being a telephonic visit, the after visit summary with patients personalized plan was offered to patient via MyChart   Notes: Nothing significant to report at this time.

## 2024-04-22 NOTE — Patient Instructions (Signed)
 Morgan [name] , Thank you for taking time out of your busy schedule to complete your Annual Wellness Visit with me. I enjoyed our conversation and look forward to speaking with you again next year. I, as well as your care team,  appreciate your ongoing commitment to your health goals. Please review the following plan we discussed and let me know if I can assist you in the future. Your Game plan/ To Do List    Referrals: If you haven't heard from the office you've been referred to, please reach out to them at the phone provided.   Follow up Visits: Next Medicare AWV with our clinical staff: 05/03/25   Have you seen your provider in the last 6 months (3 months if uncontrolled diabetes)? Yes Next Office Visit with your provider: 09/28/24  Clinician Recommendations:  Aim for 30 minutes of exercise or brisk walking, 6-8 glasses of water, and 5 servings of fruits and vegetables each day.       This is a list of the screening recommended for you and due dates:  Health Maintenance  Topic Date Due   COVID-19 Vaccine (3 - Pfizer risk series) 09/03/2023   Flu Shot  07/03/2024   Mammogram  02/05/2025   Medicare Annual Wellness Visit  04/22/2025   DEXA scan (bone density measurement)  10/04/2026   DTaP/Tdap/Td vaccine (2 - Td or Tdap) 12/10/2027   Pneumonia Vaccine  Completed   Hepatitis C Screening  Completed   HPV Vaccine  Aged Out   Meningitis B Vaccine  Aged Out   Colon Cancer Screening  Discontinued   Zoster (Shingles) Vaccine  Discontinued    Advanced directives: (In Chart) A copy of your advanced directives are scanned into your chart should your provider ever need it. Advance Care Planning is important because it:  [x]  Makes sure you receive the medical care that is consistent with your values, goals, and preferences  [x]  It provides guidance to your family and loved ones and reduces their decisional burden about whether or not they are making the right decisions based on your  wishes.  Follow the link provided in your after visit summary or read over the paperwork we have mailed to you to help you started getting your Advance Directives in place. If you need assistance in completing these, please reach out to us  so that we can help you!  See attachments for Preventive Care and Fall Prevention Tips.

## 2024-05-13 ENCOUNTER — Ambulatory Visit (INDEPENDENT_AMBULATORY_CARE_PROVIDER_SITE_OTHER)

## 2024-05-13 ENCOUNTER — Other Ambulatory Visit: Payer: Self-pay

## 2024-05-13 ENCOUNTER — Ambulatory Visit: Admitting: Family Medicine

## 2024-05-13 VITALS — BP 152/82 | HR 73 | Ht 64.0 in | Wt 179.0 lb

## 2024-05-13 DIAGNOSIS — M25562 Pain in left knee: Secondary | ICD-10-CM

## 2024-05-13 DIAGNOSIS — M25561 Pain in right knee: Secondary | ICD-10-CM | POA: Diagnosis not present

## 2024-05-13 DIAGNOSIS — M25511 Pain in right shoulder: Secondary | ICD-10-CM

## 2024-05-13 NOTE — Progress Notes (Signed)
 Joanna Muck, PhD, LAT, ATC acting as a scribe for Garlan Juniper, MD.  Morgan [name] is a 73 y.o. female who presents to [facility] at [facility] today for bilateral knee and arm pain. Pt was previously seen by Dr. Alease Hunter in 2023 for L knee pain w/ an open would.  Today, pt reports suffering a fall yesterday. She was bending over to put dis-assemble a pack 'n play and her feet went out from under her. She landed on both knees and R arm. Pt locates pain to all over both knees, w/ a burning sensation, and new wound on the anterior aspect of her L knee. She also has pain all over her R arm and periscapular region on the R  Knee swelling: yes- lump on the R knee Treatments tried: Tylenol ,   Pertinent review of systems: No fevers or chills.  Relevant historical information: History of right reverse total shoulder replacement.   Exam:  BP (!) 152/82   Pulse 73   Ht 5' 4 (1.626 m)   Wt 179 lb (81.2 kg)   LMP  (LMP Unknown)   SpO2 98%   BMI 30.73 kg/m  General: Well Developed, well nourished, and in no acute distress.   MSK: Right shoulder normal-appearing decreased range of motion Strength mildly reduced external rotation. Nontender to palpation.  Right knee: Mild swelling anterior medial knee otherwise normal-appearing mildly tender palpation anterior medial knee.  Intact strength.  Left knee: Small blood blister present of the anterior SPECT of the knee otherwise normal-appearing Nontender to palpation.  Normal motion.    Lab and Radiology Results  X-ray images of right shoulder, and bilateral knees obtained today personally and independently interpreted.  Right shoulder: Surgical hardware from reverse total shoulder replacement.  No fracture or hardware loosening is visible.  Right knee: No acute fractures are visible.  Mild degenerative changes are present.  Left knee: No acute fracture present.  Mild degenerative changes visible.  Await  formal radiology review  Diagnostic Limited MSK Ultrasound of: Right knee Quad tendon intact normal-appearing Patellar tendon is normal-appearing Area of tenderness anterior medial knee does show some superficial hypoechoic change within the subcutaneous fat consistent with hematoma or bruising.  No discrete large hematoma is present. Medial joint line no acute fractures no severe DJD. Lateral joint line no acute fractures no severe DJD. Impression: Bruising anterior medial knee    Assessment and Plan: 73 y.o. female with right shoulder and bilateral knee pain after a fall occurring yesterday.  Fortunately x-rays showed no fractures or injury around the hardware in the right shoulder and both knees. Plan for a bit of watchful waiting and Voltaren gel.  Resume home exercise program previously taught.  If not improving consider physical therapy or return to clinic.  She would like to go to [facility] for PT if needed.   PDMP not reviewed this encounter. Orders Placed This Encounter  Procedures   DG Knee AP/LAT W/Sunrise Left    Standing Status:   Future    Number of Occurrences:   1    Expiration Date:   06/12/2024    Reason for Exam (SYMPTOM  OR DIAGNOSIS REQUIRED):   bilateral knee pain    Preferred imaging location?:   [facility]   DG Knee AP/LAT W/Sunrise Right    Standing Status:   Future    Number of Occurrences:   1    Expiration Date:   06/12/2024    Reason for  Exam (SYMPTOM  OR DIAGNOSIS REQUIRED):   bilateral knee pain    Preferred imaging location?:   [place] [facility]   US  LIMITED JOINT SPACE STRUCTURES LOW BILAT(NO LINKED CHARGES)    Reason for Exam (SYMPTOM  OR DIAGNOSIS REQUIRED):   bilateral knee pain    Preferred imaging location?:   [facility] Shoulder Right    Standing Status:   Future    Number of Occurrences:   1    Expiration Date:   05/13/2025    Reason for Exam (SYMPTOM  OR DIAGNOSIS REQUIRED):   right shoulder pain     Preferred imaging location?:   [place] [facility]   No orders of the defined types were placed in this encounter.    Discussed warning signs or symptoms. Please see discharge instructions. Patient expresses understanding.   The above documentation has been reviewed and is accurate and complete Garlan Juniper, M.D.

## 2024-05-13 NOTE — Patient Instructions (Addendum)
 Thank you for coming in today.   Please get an Xray today before you leave   If not improving with home exercise program please let me know we can add physical therapy.  Please use Voltaren gel (Generic Diclofenac Gel) up to 4x daily for pain as needed.  This is available over-the-counter as both the name brand Voltaren gel and the generic diclofenac gel.

## 2024-05-18 ENCOUNTER — Ambulatory Visit: Payer: Self-pay | Admitting: Family Medicine

## 2024-05-18 DIAGNOSIS — M25511 Pain in right shoulder: Secondary | ICD-10-CM

## 2024-05-18 DIAGNOSIS — M25561 Pain in right knee: Secondary | ICD-10-CM

## 2024-05-18 NOTE — Progress Notes (Signed)
 Right knee x-ray shows a reverse total shoulder replacement with good looking hardware without fracture or injury to the hardware.

## 2024-05-18 NOTE — Progress Notes (Signed)
Right knee x-ray shows mild arthritis underneath the kneecap.

## 2024-05-18 NOTE — Progress Notes (Signed)
 Left knee x-ray shows little bit of swelling in the knee joint.

## 2024-05-21 ENCOUNTER — Encounter: Payer: Self-pay | Admitting: Podiatry

## 2024-05-21 ENCOUNTER — Ambulatory Visit (INDEPENDENT_AMBULATORY_CARE_PROVIDER_SITE_OTHER): Payer: Medicare Other | Admitting: Podiatry

## 2024-05-21 VITALS — Ht 64.0 in | Wt 179.0 lb

## 2024-05-21 DIAGNOSIS — M79674 Pain in right toe(s): Secondary | ICD-10-CM

## 2024-05-21 DIAGNOSIS — M79675 Pain in left toe(s): Secondary | ICD-10-CM

## 2024-05-21 DIAGNOSIS — B351 Tinea unguium: Secondary | ICD-10-CM | POA: Diagnosis not present

## 2024-05-21 NOTE — Progress Notes (Signed)
  Subjective:  Patient ID: Morgan [name], female    DOB: 1951-08-30,  MRN: [id number]  Chief Complaint  Patient presents with   Nail Problem    RFC.    73 y.o. female presents with the above complaint. History confirmed with patient.  Has had some falls recently and dark discoloration on the right great toe  Objective:  Physical Exam: warm, good capillary refill, no trophic changes or ulcerative lesions, normal DP and PT pulses, and normal sensory exam.  Distally there is still dystrophy and yellow and subungual debris but proximal clearing noted on most toenails.  Distal lateral right hallux has some subungual hematoma    Assessment:   1. Pain due to onychomycosis of toenails of both feet      Plan:  Patient was evaluated and treated and all questions answered.   Discussed the etiology and treatment options for the condition in detail with the patient.   Recommended debridement of the nails today. Sharp and mechanical debridement performed of all painful and mycotic nails today. Nails debrided in length and thickness using a nail nipper to level of comfort.  Subtle hematoma was minimal and did not need avulsion or further treatment should grow out uneventfully.  Will continue to monitor.  Return in 3 months     Return in about 3 months (around 08/21/2024) for painful thick fungal nails.

## 2024-06-15 ENCOUNTER — Other Ambulatory Visit: Payer: Self-pay

## 2024-06-15 ENCOUNTER — Ambulatory Visit (INDEPENDENT_AMBULATORY_CARE_PROVIDER_SITE_OTHER): Admitting: Physical Therapy

## 2024-06-15 ENCOUNTER — Encounter: Payer: Self-pay | Admitting: Physical Therapy

## 2024-06-15 ENCOUNTER — Telehealth: Payer: Self-pay | Admitting: Family Medicine

## 2024-06-15 DIAGNOSIS — M6281 Muscle weakness (generalized): Secondary | ICD-10-CM

## 2024-06-15 DIAGNOSIS — M25562 Pain in left knee: Secondary | ICD-10-CM | POA: Diagnosis not present

## 2024-06-15 DIAGNOSIS — R600 Localized edema: Secondary | ICD-10-CM

## 2024-06-15 DIAGNOSIS — M25561 Pain in right knee: Secondary | ICD-10-CM | POA: Diagnosis not present

## 2024-06-15 DIAGNOSIS — M25511 Pain in right shoulder: Secondary | ICD-10-CM

## 2024-06-15 DIAGNOSIS — R2689 Other abnormalities of gait and mobility: Secondary | ICD-10-CM

## 2024-06-15 NOTE — Telephone Encounter (Unsigned)
 Copied from CRM [id number]. Topic: Clinical - Home Health Verbal Orders >> Jun 15, 2024  4:47 PM Gennette ORN wrote: Caller/Agency: Patient   Callback Number: [phone] Service Requested: Physical Therapy Frequency: 1 time a week  Any new concerns about the patient? Yes

## 2024-06-15 NOTE — Therapy (Signed)
 OUTPATIENT PHYSICAL THERAPY EVALUATION   Patient Name: Morgan [name] MRN: [id number] DOB:March 24, 1951, 73 y.o., female Today's Date: 06/15/2024  END OF SESSION:  PT End of Session - 06/15/24 1207     Visit Number 1    Number of Visits 7    Date for PT Re-Evaluation 07/27/24    Authorization Type Medicare    Progress Note Due on Visit 10    PT Start Time 1207    PT Stop Time 1245    PT Time Calculation (min) 38 min          Past Medical History:  Diagnosis Date   Abnormal chest x-ray    Abnormal electrocardiogram    Abscess of sigmoid colon due to diverticulitis 11/04/2017   Allergic rhinitis    Allergy    Cancer (HCC)    Skin -sqameous left hand basal cell face   Cataract    MD just watching   Colon polyp    polypoid colorectal mucosa/adenomatous   Complication of anesthesia    and cries   Contact dermatitis due to poison ivy    Cystitis    Diverticulitis with abscess status post robotic low anterior rectosigmoid resection 12/11/2017 12/11/2017   Diffuse diverticulosis entire colon by colonoscopy 01/2019   Diverticulosis of colon    Fatty liver disease, nonalcoholic    GERD (gastroesophageal reflux disease)    occasional   Hearing loss    bilateral, wears hearing aids   Hemorrhoids    Hyperlipidemia    Hypertension    Lymphocytosis    Overweight(278.02)    Pneumonia    2017   PONV (postoperative nausea and vomiting)    Psoriasis 12/27/2020   RA (rheumatoid arthritis) (HCC)    Rheumatoid   S/P reverse total shoulder arthroplasty, 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   Venous insufficiency    Past Surgical History:  Procedure Laterality Date   APPLICATION OF A-CELL OF EXTREMITY Left 06/09/2020   Procedure: APPLICATION OF INTEGRA BILAYER OR PRIMATRIX;  Surgeon: Elisabeth Craig RAMAN, MD;  Location: [facility] OR;  Service: Plastics;  Laterality: Left;   APPLICATION OF WOUND VAC Left 06/09/2020   Procedure: APPLICATION OF WOUND VAC;  Surgeon: Elisabeth Craig RAMAN, MD;  Location: [facility] OR;  Service:  Plastics;  Laterality: Left;   APPLICATION OF WOUND VAC Left 07/26/2020   Procedure: APPLICATION OF WOUND VAC;  Surgeon: Elisabeth Craig RAMAN, MD;  Location: [facility] OR;  Service: Plastics;  Laterality: Left;  APPLICATION OF WOUND VAC   CATARACT EXTRACTION W/ INTRAOCULAR LENS IMPLANT Bilateral 2022   CESAREAN SECTION     [phone]   chemical and laser endovenous ablation of LE veins  2008, 2009, 2010, 2011   Dr Doreene et al   COLONOSCOPY     multiple   DRESSING CHANGE UNDER ANESTHESIA Right 04/24/2020   Procedure: DRESSING CHANGE UNDER ANESTHESIA right knee;  Surgeon: Gerome Charleston, MD;  Location: [facility];  Service: Orthopedics;  Laterality: Right;   endoscopy     multiple   FOOT SURGERY     left   I & D EXTREMITY Left 06/09/2020   Procedure: Debridement of left knee wound;  Surgeon: Elisabeth Craig RAMAN, MD;  Location: [facility] OR;  Service: Plastics;  Laterality: Left;  total case 45 min   INCISION AND DRAINAGE Bilateral 04/22/2020   Procedure: INCISION AND DRAINAGE, WOUND VAC PLACEMENT ON LEFT KNEE;  Surgeon: Fidel Rogue, MD;  Location: [facility];  Service: Orthopedics;  Laterality: Bilateral;   INCISION AND DRAINAGE ABSCESS Left 04/24/2020  Procedure: INCISION AND DRAINAGE LEFT KNEE;  Surgeon: Gerome Charleston, MD;  Location: [facility];  Service: Orthopedics;  Laterality: Left;   INNER EAR SURGERY Right    childhood; right, ear drum repair   LAPAROSCOPIC LYSIS OF ADHESIONS N/A 12/11/2017   Procedure: LAPAROSCOPIC LYSIS OF ADHESIONS;  Surgeon: Sheldon Standing, MD;  Location: [facility];  Service: General;  Laterality: N/A;   NASAL SEPTUM SURGERY     POLYPECTOMY     PROCTOSCOPY N/A 12/11/2017   Procedure: RIDGED PROCTOSCOPY;  Surgeon: Sheldon Standing, MD;  Location: [facility];  Service: General;  Laterality: N/A;   REVERSE SHOULDER ARTHROPLASTY Right 09/10/2019   Procedure: REVERSE SHOULDER ARTHROPLASTY;  Surgeon: Melita Drivers, MD;  Location: [facility];  Service: Orthopedics;  Laterality: Right;    SKIN  SPLIT GRAFT Left 07/26/2020   Procedure: SKIN GRAFT SPLIT THICKNESS;  Surgeon: Elisabeth Craig RAMAN, MD;  Location: [facility];  Service: Plastics;  Laterality: Left;  SKIN GRAFT SPLIT THICKNESS   TONSILLECTOMY     TOTAL ABDOMINAL HYSTERECTOMY  11/1985   TYMPANOPLASTY Right 11/03/2018   Procedure: TYMPANOPLASTY;  Surgeon: Carlie Clark, MD;  Location: [facility];  Service: ENT;  Laterality: Right;   Patient Active Problem List   Diagnosis Date Noted   Screening for osteoporosis 10/09/2021   Psoriasis 12/27/2020   Bilateral sensorineural hearing loss 02/02/2019   H/O partial resection of colon 01/31/2018   Rheumatoid arthritis involving multiple sites with positive rheumatoid factor (HCC) 10/31/2017   Immunosuppressed status (HCC) 10/08/2017   Obesity with body mass index 30 or greater 01/25/2017   Current chronic use of systemic steroids 06/08/2016   Uses hearing aid 12/09/2015   Leukocytosis 08/27/2014   Thrombocytopenia (HCC) 11/23/2013   Fatty liver disease, nonalcoholic 11/24/2010   Benign colon polyp 10/20/2008   Essential hypertension 10/20/2008   Diverticulosis of colon 10/20/2008   Mixed hyperlipidemia 04/23/2008   Venous (peripheral) insufficiency 04/23/2008   Allergic rhinitis 04/23/2008   Gastroesophageal reflux disease without esophagitis 04/23/2008    PCP: Jodie Lavern CROME, MD  REFERRING PROVIDER: Joane Artist RAMAN, MD  REFERRING DIAG: [phone] (ICD-10-CM) - Acute pain of both knees M25.511 (ICD-10-CM) - Acute pain of right shoulder  THERAPY DIAG:  Acute pain of both knees  Muscle weakness (generalized)  Other abnormalities of gait and mobility  Localized edema  Rationale for Evaluation and Treatment: Rehabilitation  ONSET DATE: 05/12/24 fall on knees  SUBJECTIVE:                                                                                                                                                                                       SUBJECTIVE  STATEMENT: Pt notes a fall in June landing straight on her knees. Has been using ice on bilat knees since the fall. Pt states she is not able to go up stairs like she was able to before -- has to go one step up at a time. Not just pain in her knees, she is getting a pain in the back of her R knee to her buttock. States it grabs (R LE is normally stronger than L LE) and has nearly made her fall. Goes to [employer] and walks the track but has to go up 24 steps. Feels that her knees are still swollen  PERTINENT HISTORY: Prior bilat knee injuries, RA, history of falls since 2021,   PAIN:  Are you having pain? Yes: NPRS scale: 2/10 currently, 4 at worst within the past week  Pain location: R posterior knee, L anterior knee Pain description: Grabs Aggravating factors: Steps, uneven surfaces Relieving factors: Ice, advil  PRECAUTIONS: Fall  RED FLAGS: None   WEIGHT BEARING RESTRICTIONS: No  FALLS:  Has patient fallen in last 6 months? Yes. 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  DIAGNOSTIC FINDINGS:  R knee x-rays 05/13/24 IMPRESSION: 1. Mild patellofemoral osteoarthritis. 2. Possible small soft tissue injury within the prepatellar soft tissues.  L knee x-ray 05/13/24 IMPRESSION: Small joint effusion. No acute fracture.  PATIENT SURVEYS :  PSFS: THE PATIENT SPECIFIC FUNCTIONAL SCALE  Place score of 0-10 (0 = unable to perform activity and 10 = able to perform activity at the same level as  before injury or problem)  Activity Date: 06/15/24    Going up/down stairs 2    2.  Bending knees 2    3.  Walking on uneven surfaces 4    4.      Total Score 8/3 = 2.67      Total Score = Sum of activity scores/number of activities  Minimally Detectable Change: 3 points (for single activity); 2 points (for average score)  [facility] (nd). The Patient Specific Functional Scale . Retrieved from [email or web address]   COGNITION: Overall cognitive status: Within functional limits for tasks assessed     SENSATION: Tingling bilat anterior knees  POSTURE: Rounded shoulder, fwd flexed trunk   LOWER EXTREMITY ROM:     Active  Right eval Left eval  Hip flexion    Hip extension    Hip abduction    Hip adduction    Hip internal rotation    Hip external rotation    Knee flexion 115 pull in back of knee 115  Knee extension 5 5  Ankle dorsiflexion    Ankle plantarflexion    Ankle inversion    Ankle eversion     (Blank rows = not tested)    LOWER EXTREMITY MMT:    MMT Right eval Left eval  Hip flexion 3 3+  Hip extension 3- 3  Hip abduction 3+ 3+  Hip adduction 4- 4  Hip internal rotation    Hip external rotation  Knee flexion 3+ pain 3+  Knee extension 3+ pain 4 pain  Ankle dorsiflexion    Ankle plantarflexion    Ankle inversion    Ankle eversion     (Blank rows = not tested)   FUNCTIONAL TESTS: 5x STS: 19.34 sec  PALPATION:  TTP R medial hamstring/adductor, bilat adductors  Edema noted along R medial knee                                                                                                                           TREATMENT DATE:  06/15/24 Performed trial of HEP below   PATIENT EDUCATION: Education details: Exam findings, POC, initial HEP Person educated: Patient Education method: Explanation, Demonstration, and Handouts Education comprehension: verbalized understanding,  returned demonstration, and needs further education  HOME EXERCISE PROGRAM: Access Code: [email or web address] URL: [email or web address] Date: 06/15/2024 Prepared by: Lynea Rollison April Earnie Starring  Exercises - Seated Hamstring Stretch  - 1 x daily - 7 x weekly - 2 sets - 30 sec hold - Seated Hip Adductor Stretch  - 1 x daily - 7 x weekly - 2 sets - 30 sec hold - Long Sitting Straight Leg Raise with External Rotation  - 1 x daily - 7 x weekly - 2 sets - 10 reps - Seated Hamstring Set  - 1 x daily - 7 x weekly - 3 sets - 10 reps  ASSESSMENT:  CLINICAL IMPRESSION: Patient is a 73 y.o. F who was seen today for physical therapy evaluation and treatment for bilat knee pain s/p fall in June. PMH is significant for prior knee injuries and falls since 2021. Assessment demos gross bilat LE weakness with pain most notable with hamstring activation on R indicating possible hamstring strain. Pt will benefit from PT to improve on these issues to ease her ability to ascend/descend stairs to continue her normal wellness routine/activities.    OBJECTIVE IMPAIRMENTS: Abnormal gait, decreased activity tolerance, decreased balance, decreased coordination, decreased endurance, decreased mobility, difficulty walking, decreased strength, increased edema, impaired sensation, improper body mechanics, postural dysfunction, and pain.   ACTIVITY LIMITATIONS: bending, standing, squatting, stairs, transfers, bed mobility, and locomotion level  PARTICIPATION LIMITATIONS: cleaning, laundry, shopping, community activity, and yard work  PERSONAL FACTORS: Fitness, Past/current experiences, and Time since onset of injury/illness/exacerbation are also affecting patient's functional outcome.   REHAB POTENTIAL: Good  CLINICAL DECISION MAKING: Evolving/moderate complexity  EVALUATION COMPLEXITY: Moderate  GOALS: Goals reviewed with patient? Yes  SHORT TERM GOALS: Target date: 07/06/2024   Pt will be ind with initial  HEP Baseline: Goal status: INITIAL  2.  Pt will demo pain free knee AROM Baseline:  Goal status: INITIAL   LONG TERM GOALS: Target date: 07/27/2024   Pt will be ind with management and progression of HEP Baseline:  Goal status: INITIAL  2.  Pt will have improved PSFS average score to >/=4.67 to demo MCID Baseline: 2.67 Goal status: INITIAL  3.  Pt will have improved 5x STS to </=13  sec for decreased fall risk Baseline:  Goal status: INITIAL  4.  Pt will demo at least 4/5 bilat LE strength for improved stability and strength for stairs Baseline:  Goal status: INITIAL    PLAN: PT FREQUENCY: 1-2x/week  PT DURATION: 6 weeks  PLANNED INTERVENTIONS: 97164- PT Re-evaluation, 97750- Physical Performance Testing, 97110-Therapeutic exercises, 97530- Therapeutic activity, [id number]- Neuromuscular re-education, 97535- Self Care, [id number]- Manual therapy, [id number]- Gait training, [phone]- Aquatic Therapy, [phone]- Electrical stimulation (unattended), 97016- Vasopneumatic device, [id number]- Ultrasound, [id number]- Ionotophoresis 4mg /ml Dexamethasone , 79439 (1-2 muscles), 20561 (3+ muscles)- Dry Needling, Patient/Family education, Balance training, Stair training, Taping, Joint mobilization, Spinal mobilization, Cryotherapy, and Moist heat  PLAN FOR NEXT SESSION: Assess response to HEP. Stretch/strengthen quads/hamstring, initiate hip strengthening.   Taleigha Pinson April Ma L Kailyn Vanderslice, PT, DPT 06/15/2024, 1:09 PM

## 2024-06-25 ENCOUNTER — Encounter: Payer: Self-pay | Admitting: Physical Therapy

## 2024-06-25 ENCOUNTER — Ambulatory Visit: Admitting: Physical Therapy

## 2024-06-25 DIAGNOSIS — R2689 Other abnormalities of gait and mobility: Secondary | ICD-10-CM | POA: Diagnosis not present

## 2024-06-25 DIAGNOSIS — M25562 Pain in left knee: Secondary | ICD-10-CM

## 2024-06-25 DIAGNOSIS — R600 Localized edema: Secondary | ICD-10-CM

## 2024-06-25 DIAGNOSIS — M6281 Muscle weakness (generalized): Secondary | ICD-10-CM

## 2024-06-25 DIAGNOSIS — M25561 Pain in right knee: Secondary | ICD-10-CM

## 2024-06-25 NOTE — Therapy (Signed)
 OUTPATIENT PHYSICAL THERAPY TREATMENT   Patient Name: Morgan [name] MRN: [id number] DOB:[birth year], 73 y.o., female Today's Date: 06/25/2024  END OF SESSION:  PT End of Session - 06/25/24 1111     Visit Number 2    Number of Visits 7    Date for PT Re-Evaluation 07/27/24    Authorization Type Medicare    Progress Note Due on Visit 10    PT Start Time 1107    PT Stop Time 1145    PT Time Calculation (min) 38 min           Past Medical History:  Diagnosis Date   Abnormal chest x-ray    Abnormal electrocardiogram    Abscess of sigmoid colon due to diverticulitis 11/04/2017   Allergic rhinitis    Allergy    Cancer (HCC)    Skin -sqameous left hand basal cell face   Cataract    MD just watching   Colon polyp    polypoid colorectal mucosa/adenomatous   Complication of anesthesia    and cries   Contact dermatitis due to poison ivy    Cystitis    Diverticulitis with abscess status post robotic low anterior rectosigmoid resection 12/11/2017 12/11/2017   Diffuse diverticulosis entire colon by colonoscopy 01/2019   Diverticulosis of colon    Fatty liver disease, nonalcoholic    GERD (gastroesophageal reflux disease)    occasional   Hearing loss    bilateral, wears hearing aids   Hemorrhoids    Hyperlipidemia    Hypertension    Lymphocytosis    Overweight(278.02)    Pneumonia    2017   PONV (postoperative nausea and vomiting)    Psoriasis 12/27/2020   RA (rheumatoid arthritis) (HCC)    Rheumatoid   S/P reverse total shoulder arthroplasty, 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   Venous insufficiency    Past Surgical History:  Procedure Laterality Date   APPLICATION OF A-CELL OF EXTREMITY Left 06/09/2020   Procedure: APPLICATION OF INTEGRA BILAYER OR PRIMATRIX;  Surgeon: Elisabeth Craig RAMAN, MD;  Location: [facility];  Service: Plastics;  Laterality: Left;   APPLICATION OF WOUND VAC Left 06/09/2020   Procedure: APPLICATION OF WOUND VAC;  Surgeon: Elisabeth Craig RAMAN, MD;  Location: [facility];   Service: Plastics;  Laterality: Left;   APPLICATION OF WOUND VAC Left 07/26/2020   Procedure: APPLICATION OF WOUND VAC;  Surgeon: Elisabeth Craig RAMAN, MD;  Location: [facility];  Service: Plastics;  Laterality: Left;  APPLICATION OF WOUND VAC   CATARACT EXTRACTION W/ INTRAOCULAR LENS IMPLANT Bilateral 2022   CESAREAN SECTION     [phone]   chemical and laser endovenous ablation of LE veins  2008, 2009, 2010, 2011   Dr Doreene et al   COLONOSCOPY     multiple   DRESSING CHANGE UNDER ANESTHESIA Right 04/24/2020   Procedure: DRESSING CHANGE UNDER ANESTHESIA right knee;  Surgeon: Gerome Charleston, MD;  Location: [facility];  Service: Orthopedics;  Laterality: Right;   endoscopy     multiple   FOOT SURGERY     left   I & D EXTREMITY Left 06/09/2020   Procedure: Debridement of left knee wound;  Surgeon: Elisabeth Craig RAMAN, MD;  Location: [facility] OR;  Service: Plastics;  Laterality: Left;  total case 45 min   INCISION AND DRAINAGE Bilateral 04/22/2020   Procedure: INCISION AND DRAINAGE, WOUND VAC PLACEMENT ON LEFT KNEE;  Surgeon: Fidel Rogue, MD;  Location: [facility];  Service: Orthopedics;  Laterality: Bilateral;   INCISION AND DRAINAGE ABSCESS Left  04/24/2020   Procedure: INCISION AND DRAINAGE LEFT KNEE;  Surgeon: Gerome Charleston, MD;  Location: [facility];  Service: Orthopedics;  Laterality: Left;   INNER EAR SURGERY Right    childhood; right, ear drum repair   LAPAROSCOPIC LYSIS OF ADHESIONS N/A 12/11/2017   Procedure: LAPAROSCOPIC LYSIS OF ADHESIONS;  Surgeon: Sheldon Standing, MD;  Location: [facility];  Service: General;  Laterality: N/A;   NASAL SEPTUM SURGERY     POLYPECTOMY     PROCTOSCOPY N/A 12/11/2017   Procedure: RIDGED PROCTOSCOPY;  Surgeon: Sheldon Standing, MD;  Location: [facility];  Service: General;  Laterality: N/A;   REVERSE SHOULDER ARTHROPLASTY Right 09/10/2019   Procedure: REVERSE SHOULDER ARTHROPLASTY;  Surgeon: Melita Drivers, MD;  Location: [facility];  Service: Orthopedics;  Laterality: Right;     SKIN SPLIT GRAFT Left 07/26/2020   Procedure: SKIN GRAFT SPLIT THICKNESS;  Surgeon: Elisabeth Craig RAMAN, MD;  Location: [facility];  Service: Plastics;  Laterality: Left;  SKIN GRAFT SPLIT THICKNESS   TONSILLECTOMY     TOTAL ABDOMINAL HYSTERECTOMY  11/1985   TYMPANOPLASTY Right 11/03/2018   Procedure: TYMPANOPLASTY;  Surgeon: Carlie Clark, MD;  Location: [facility];  Service: ENT;  Laterality: Right;   Patient Active Problem List   Diagnosis Date Noted   Screening for osteoporosis 10/09/2021   Psoriasis 12/27/2020   Bilateral sensorineural hearing loss 02/02/2019   H/O partial resection of colon 01/31/2018   Rheumatoid arthritis involving multiple sites with positive rheumatoid factor (HCC) 10/31/2017   Immunosuppressed status (HCC) 10/08/2017   Obesity with body mass index 30 or greater 01/25/2017   Current chronic use of systemic steroids 06/08/2016   Uses hearing aid 12/09/2015   Leukocytosis 08/27/2014   Thrombocytopenia (HCC) 11/23/2013   Fatty liver disease, nonalcoholic 11/24/2010   Benign colon polyp 10/20/2008   Essential hypertension 10/20/2008   Diverticulosis of colon 10/20/2008   Mixed hyperlipidemia 04/23/2008   Venous (peripheral) insufficiency 04/23/2008   Allergic rhinitis 04/23/2008   Gastroesophageal reflux disease without esophagitis 04/23/2008    PCP: Jodie Lavern CROME, MD  REFERRING PROVIDER: Joane Artist RAMAN, MD  REFERRING DIAG: [phone] (ICD-10-CM) - Acute pain of both knees M25.511 (ICD-10-CM) - Acute pain of right shoulder  THERAPY DIAG:  Acute pain of both knees  Muscle weakness (generalized)  Other abnormalities of gait and mobility  Localized edema  Rationale for Evaluation and Treatment: Rehabilitation  ONSET DATE: 05/12/24 fall on knees  SUBJECTIVE:  SUBJECTIVE STATEMENT: Pt reports exercises are going well. Has had a hard time doing the SLR on the bed because it's too soft.   From eval: Pt notes a fall in June landing straight on her knees. Has been using ice on bilat knees since the fall. Pt states she is not able to go up stairs like she was able to before -- has to go one step up at a time. Not just pain in her knees, she is getting a pain in the back of her R knee to her buttock. States it grabs (R LE is normally stronger than L LE) and has nearly made her fall. Goes to [employer] and walks the track but has to go up 24 steps. Feels that her knees are still swollen  PERTINENT HISTORY: Prior bilat knee injuries, RA, history of falls since 2021,   PAIN:  Are you having pain? Yes: NPRS scale: 0/10 currently, 4 at worst within the past week  Pain location: R posterior knee, L anterior knee Pain description: Grabs Aggravating factors: Steps, uneven surfaces Relieving factors: Ice, advil  PRECAUTIONS: Fall  RED FLAGS: None   WEIGHT BEARING RESTRICTIONS: No  FALLS:  Has patient fallen in last 6 months? Yes. 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  DIAGNOSTIC FINDINGS:  R knee x-rays 05/13/24 IMPRESSION: 1. Mild patellofemoral osteoarthritis. 2. Possible small soft tissue injury within the prepatellar soft tissues.  L knee x-ray 05/13/24 IMPRESSION: Small joint effusion. No acute fracture.  PATIENT SURVEYS :  PSFS: THE PATIENT SPECIFIC FUNCTIONAL SCALE  Place  score of 0-10 (0 = unable to perform activity and 10 = able to perform activity at the same level as before injury or problem)  Activity Date: 06/15/24    Going up/down stairs 2    2.  Bending knees 2    3.  Walking on uneven surfaces 4    4.      Total Score 8/3 = 2.67      Total Score = Sum of activity scores/number of activities  Minimally Detectable Change: 3 points (for single activity); 2 points (for average score)  [facility] (nd). The Patient Specific Functional Scale . Retrieved from [email or web address]       SENSATION: Tingling bilat anterior knees  POSTURE: Rounded shoulder, fwd flexed trunk   LOWER EXTREMITY ROM:     Active  Right eval Left eval  Hip flexion    Hip extension    Hip abduction    Hip adduction    Hip internal rotation    Hip external rotation    Knee flexion 115 pull in back of knee 115  Knee extension 5 5  Ankle dorsiflexion    Ankle plantarflexion    Ankle inversion    Ankle eversion     (Blank rows = not tested)    LOWER EXTREMITY MMT:    MMT Right eval Left eval  Hip flexion 3 3+  Hip extension 3- 3  Hip abduction 3+ 3+  Hip adduction 4- 4  Hip internal rotation    Hip external rotation    Knee flexion 3+ pain 3+  Knee extension 3+ pain 4 pain  Ankle dorsiflexion    Ankle plantarflexion    Ankle inversion    Ankle eversion     (Blank rows = not tested)   FUNCTIONAL TESTS: 5x STS: 19.34 sec  PALPATION:  TTP R medial hamstring/adductor, bilat adductors  Edema noted along R medial knee                                                                                                                           [facility] Adult PT Treatment:                                                DATE: 06/25/2024 Therapeutic Exercise: Recumbent bike L1 x 6 min Seated hamstring stretch 2 x 30 Seated adductor stretch x 30 Seated SLR 2x10 Seated heel slides 3x10 Seated  quad stretch with strap 2x30 Standing hip ext red TB 2x10   PATIENT EDUCATION: Education details: HEP updates Person educated: Patient Education method: Explanation, Demonstration, and Handouts Education comprehension: verbalized understanding, returned demonstration, and needs further education  HOME EXERCISE PROGRAM: Access Code: [email or web address] URL: [email or web address] Date: 06/25/2024 Prepared by: Jadalee Westcott April Earnie Starring  Exercises - Seated Hamstring Stretch  - 1 x daily - 7 x weekly - 2 sets - 30 sec hold - Seated Hip Adductor Stretch  - 1 x daily - 7 x weekly - 2 sets - 30 sec hold - Long Sitting Straight Leg Raise with External Rotation  - 1 x daily - 7 x weekly - 2 sets - 10 reps - Seated Heel Slide  - 1 x daily - 7 x weekly - 3 sets - 10 reps - Seated Quadriceps Stretch From Chair With Belt  - 1 x daily - 7 x weekly - 2 sets - 30 sec hold - Standing Quad Stretch with Strap  - 1 x daily - 7 x weekly - 3 sets - 10 reps - Hip Extension with Resistance Loop  - 1 x daily - 7 x weekly - 2 sets - 10 reps  ASSESSMENT:  CLINICAL IMPRESSION: Reviewed HEP with pt demonstrating good understanding. Reduced pain. Worked on improving knee ROM without pain. Will slowly build to more resistance and weightbearing activities next session if pain remains well managed.   From eval: Patient is a 73 y.o. F who was seen today for physical therapy evaluation and treatment for bilat knee pain s/p fall in June. PMH is significant for prior knee injuries and falls since 2021. Assessment demos gross bilat LE weakness with pain most notable with hamstring activation on R indicating possible hamstring strain. Pt will benefit from PT to improve on these issues to ease her ability to ascend/descend stairs to continue her  normal wellness routine/activities.    OBJECTIVE IMPAIRMENTS: Abnormal gait, decreased activity tolerance, decreased balance, decreased coordination, decreased endurance,  decreased mobility, difficulty walking, decreased strength, increased edema, impaired sensation, improper body mechanics, postural dysfunction, and pain.    GOALS: Goals reviewed with patient? Yes  SHORT TERM GOALS: Target date: 07/06/2024   Pt will be ind with initial HEP Baseline: Goal status: INITIAL  2.  Pt will demo pain free knee AROM Baseline:  Goal status: INITIAL   LONG TERM GOALS: Target date: 07/27/2024   Pt will be ind with management and progression of HEP Baseline:  Goal status: INITIAL  2.  Pt will have improved PSFS average score to >/=4.67 to demo MCID Baseline: 2.67 Goal status: INITIAL  3.  Pt will have improved 5x STS to </=13 sec for decreased fall risk Baseline:  Goal status: INITIAL  4.  Pt will demo at least 4/5 bilat LE strength for improved stability and strength for stairs Baseline:  Goal status: INITIAL    PLAN: PT FREQUENCY: 1-2x/week  PT DURATION: 6 weeks  PLANNED INTERVENTIONS: 97164- PT Re-evaluation, 97750- Physical Performance Testing, 97110-Therapeutic exercises, 97530- Therapeutic activity, 97112- Neuromuscular re-education, 97535- Self Care, [id number]- Manual therapy, [id number]- Gait training, [phone]- Aquatic Therapy, [phone]- Electrical stimulation (unattended), 97016- Vasopneumatic device, [id number]- Ultrasound, [id number]- Ionotophoresis 4mg /ml Dexamethasone , 79439 (1-2 muscles), 20561 (3+ muscles)- Dry Needling, Patient/Family education, Balance training, Stair training, Taping, Joint mobilization, Spinal mobilization, Cryotherapy, and Moist heat  PLAN FOR NEXT SESSION: Assess response to HEP. Stretch/strengthen quads/hamstring, hips. More resistance/weight bearing type exercises for home. Consider stairs/steps training and work on Geologist, engineering.    Auston Halfmann April Ma L Maleiyah Releford, PT, DPT 06/25/2024, 11:12 AM

## 2024-07-02 ENCOUNTER — Encounter: Payer: Self-pay | Admitting: Physical Therapy

## 2024-07-02 ENCOUNTER — Ambulatory Visit (INDEPENDENT_AMBULATORY_CARE_PROVIDER_SITE_OTHER): Admitting: Physical Therapy

## 2024-07-02 DIAGNOSIS — R2689 Other abnormalities of gait and mobility: Secondary | ICD-10-CM

## 2024-07-02 DIAGNOSIS — R600 Localized edema: Secondary | ICD-10-CM

## 2024-07-02 DIAGNOSIS — M25561 Pain in right knee: Secondary | ICD-10-CM | POA: Diagnosis not present

## 2024-07-02 DIAGNOSIS — M6281 Muscle weakness (generalized): Secondary | ICD-10-CM

## 2024-07-02 DIAGNOSIS — M25562 Pain in left knee: Secondary | ICD-10-CM

## 2024-07-02 NOTE — Therapy (Signed)
 OUTPATIENT PHYSICAL THERAPY TREATMENT   Patient Name: Morgan [name] MRN: [id number] DOB:[birth year], 73 y.o., female Today's Date: 07/02/2024  END OF SESSION:  PT End of Session - 07/02/24 1101     Visit Number 3    Number of Visits 7    Date for PT Re-Evaluation 07/27/24    Authorization Type Medicare    Progress Note Due on Visit 10    PT Start Time 1101    PT Stop Time 1140    PT Time Calculation (min) 39 min    Activity Tolerance Patient tolerated treatment well          Past Medical History:  Diagnosis Date   Abnormal chest x-ray    Abnormal electrocardiogram    Abscess of sigmoid colon due to diverticulitis 11/04/2017   Allergic rhinitis    Allergy    Cancer (HCC)    Skin -sqameous left hand basal cell face   Cataract    MD just watching   Colon polyp    polypoid colorectal mucosa/adenomatous   Complication of anesthesia    and cries   Contact dermatitis due to poison ivy    Cystitis    Diverticulitis with abscess status post robotic low anterior rectosigmoid resection 12/11/2017 12/11/2017   Diffuse diverticulosis entire colon by colonoscopy 01/2019   Diverticulosis of colon    Fatty liver disease, nonalcoholic    GERD (gastroesophageal reflux disease)    occasional   Hearing loss    bilateral, wears hearing aids   Hemorrhoids    Hyperlipidemia    Hypertension    Lymphocytosis    Overweight(278.02)    Pneumonia    2017   PONV (postoperative nausea and vomiting)    Psoriasis 12/27/2020   RA (rheumatoid arthritis) (HCC)    Rheumatoid   S/P reverse total shoulder arthroplasty, 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   Venous insufficiency    Past Surgical History:  Procedure Laterality Date   APPLICATION OF A-CELL OF EXTREMITY Left 06/09/2020   Procedure: APPLICATION OF INTEGRA BILAYER OR PRIMATRIX;  Surgeon: Elisabeth Craig RAMAN, MD;  Location: [facility];  Service: Plastics;  Laterality: Left;   APPLICATION OF WOUND VAC Left 06/09/2020   Procedure: APPLICATION OF WOUND VAC;   Surgeon: Elisabeth Craig RAMAN, MD;  Location: [facility];  Service: Plastics;  Laterality: Left;   APPLICATION OF WOUND VAC Left 07/26/2020   Procedure: APPLICATION OF WOUND VAC;  Surgeon: Elisabeth Craig RAMAN, MD;  Location: [facility];  Service: Plastics;  Laterality: Left;  APPLICATION OF WOUND VAC   CATARACT EXTRACTION W/ INTRAOCULAR LENS IMPLANT Bilateral 2022   CESAREAN SECTION     [phone]   chemical and laser endovenous ablation of LE veins  2008, 2009, 2010, 2011   Dr Doreene et al   COLONOSCOPY     multiple   DRESSING CHANGE UNDER ANESTHESIA Right 04/24/2020   Procedure: DRESSING CHANGE UNDER ANESTHESIA right knee;  Surgeon: Gerome Charleston, MD;  Location: [facility];  Service: Orthopedics;  Laterality: Right;   endoscopy     multiple   FOOT SURGERY     left   I & D EXTREMITY Left 06/09/2020   Procedure: Debridement of left knee wound;  Surgeon: Elisabeth Craig RAMAN, MD;  Location: [facility] OR;  Service: Plastics;  Laterality: Left;  total case 45 min   INCISION AND DRAINAGE Bilateral 04/22/2020   Procedure: INCISION AND DRAINAGE, WOUND VAC PLACEMENT ON LEFT KNEE;  Surgeon: Fidel Rogue, MD;  Location: [facility];  Service: Orthopedics;  Laterality:  Bilateral;   INCISION AND DRAINAGE ABSCESS Left 04/24/2020   Procedure: INCISION AND DRAINAGE LEFT KNEE;  Surgeon: Gerome Charleston, MD;  Location: [facility];  Service: Orthopedics;  Laterality: Left;   INNER EAR SURGERY Right    childhood; right, ear drum repair   LAPAROSCOPIC LYSIS OF ADHESIONS N/A 12/11/2017   Procedure: LAPAROSCOPIC LYSIS OF ADHESIONS;  Surgeon: Sheldon Standing, MD;  Location: [facility];  Service: General;  Laterality: N/A;   NASAL SEPTUM SURGERY     POLYPECTOMY     PROCTOSCOPY N/A 12/11/2017   Procedure: RIDGED PROCTOSCOPY;  Surgeon: Sheldon Standing, MD;  Location: [facility];  Service: General;  Laterality: N/A;   REVERSE SHOULDER ARTHROPLASTY Right 09/10/2019   Procedure: REVERSE SHOULDER ARTHROPLASTY;  Surgeon: Melita Drivers, MD;  Location: [facility];   Service: Orthopedics;  Laterality: Right;    SKIN SPLIT GRAFT Left 07/26/2020   Procedure: SKIN GRAFT SPLIT THICKNESS;  Surgeon: Elisabeth Craig RAMAN, MD;  Location: [facility];  Service: Plastics;  Laterality: Left;  SKIN GRAFT SPLIT THICKNESS   TONSILLECTOMY     TOTAL ABDOMINAL HYSTERECTOMY  11/1985   TYMPANOPLASTY Right 11/03/2018   Procedure: TYMPANOPLASTY;  Surgeon: Carlie Clark, MD;  Location: [facility];  Service: ENT;  Laterality: Right;   Patient Active Problem List   Diagnosis Date Noted   Screening for osteoporosis 10/09/2021   Psoriasis 12/27/2020   Bilateral sensorineural hearing loss 02/02/2019   H/O partial resection of colon 01/31/2018   Rheumatoid arthritis involving multiple sites with positive rheumatoid factor (HCC) 10/31/2017   Immunosuppressed status (HCC) 10/08/2017   Obesity with body mass index 30 or greater 01/25/2017   Current chronic use of systemic steroids 06/08/2016   Uses hearing aid 12/09/2015   Leukocytosis 08/27/2014   Thrombocytopenia (HCC) 11/23/2013   Fatty liver disease, nonalcoholic 11/24/2010   Benign colon polyp 10/20/2008   Essential hypertension 10/20/2008   Diverticulosis of colon 10/20/2008   Mixed hyperlipidemia 04/23/2008   Venous (peripheral) insufficiency 04/23/2008   Allergic rhinitis 04/23/2008   Gastroesophageal reflux disease without esophagitis 04/23/2008    PCP: Jodie Lavern CROME, MD  REFERRING PROVIDER: Joane Artist RAMAN, MD  REFERRING DIAG: [phone] (ICD-10-CM) - Acute pain of both knees M25.511 (ICD-10-CM) - Acute pain of right shoulder  THERAPY DIAG:  Acute pain of both knees  Muscle weakness (generalized)  Other abnormalities of gait and mobility  Localized edema  Rationale for Evaluation and Treatment: Rehabilitation  ONSET DATE: 05/12/24 fall on knees  SUBJECTIVE:  SUBJECTIVE STATEMENT: Pt states she can tell things are getting better. Pt reports a little pain in the R along the lateral side. Can still feel it in the posterior knee when she steps up with her R leg. L knee has been better. Will still get some aching in the back of R LE and down side of her calf.   From eval: Pt notes a fall in June landing straight on her knees. Has been using ice on bilat knees since the fall. Pt states she is not able to go up stairs like she was able to before -- has to go one step up at a time. Not just pain in her knees, she is getting a pain in the back of her R knee to her buttock. States it grabs (R LE is normally stronger than L LE) and has nearly made her fall. Goes to [employer] and walks the track but has to go up 24 steps. Feels that her knees are still swollen  PERTINENT HISTORY: Prior bilat knee injuries, RA, history of falls since 2021,   PAIN:  Are you having pain? Yes: NPRS scale: 2/10 currently  Pain location: R posterior knee, L anterior knee Pain description: Grabs Aggravating factors: Steps, uneven surfaces Relieving factors: Ice, advil  PRECAUTIONS: Fall  RED FLAGS: None   WEIGHT BEARING RESTRICTIONS: No  FALLS:  Has patient fallen in last 6 months? Yes. 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  DIAGNOSTIC FINDINGS:  R knee x-rays 05/13/24 IMPRESSION: 1. Mild patellofemoral osteoarthritis. 2. Possible small soft tissue injury  within the prepatellar soft tissues.  L knee x-ray 05/13/24 IMPRESSION: Small joint effusion. No acute fracture.  PATIENT SURVEYS :  PSFS: THE PATIENT SPECIFIC FUNCTIONAL SCALE  Place score of 0-10 (0 = unable to perform activity and 10 = able to perform activity at the same level as before injury or problem)  Activity Date: 06/15/24    Going up/down stairs 2    2.  Bending knees 2    3.  Walking on uneven surfaces 4    4.      Total Score 8/3 = 2.67      Total Score = Sum of activity scores/number of activities  Minimally Detectable Change: 3 points (for single activity); 2 points (for average score)  [facility] (nd). The Patient Specific Functional Scale . Retrieved from [email or web address]       SENSATION: Tingling bilat anterior knees  POSTURE: Rounded shoulder, fwd flexed trunk   LOWER EXTREMITY ROM:     Active  Right eval Left eval  Hip flexion    Hip extension    Hip abduction    Hip adduction    Hip internal rotation    Hip external rotation    Knee flexion 115 pull in back of knee 115  Knee extension 5 5  Ankle dorsiflexion    Ankle plantarflexion    Ankle inversion    Ankle eversion     (Blank rows = not tested)  LOWER EXTREMITY MMT:    MMT Right eval Left eval  Hip flexion 3 3+  Hip extension 3- 3  Hip abduction 3+ 3+  Hip adduction 4- 4  Hip internal rotation    Hip external rotation    Knee flexion 3+ pain 3+  Knee extension 3+ pain 4 pain  Ankle dorsiflexion    Ankle plantarflexion    Ankle inversion    Ankle eversion     (Blank rows = not tested)   FUNCTIONAL TESTS: 5x STS: 19.34 sec  PALPATION:  TTP R medial hamstring/adductor, bilat adductors  Edema noted along R medial knee                                                                                                                           [facility] Adult PT Treatment:                                                 DATE: 07/02/2024 Therapeutic Exercise: Recumbent bike L1 x 6 min Standing runner's stretch 2x30 cues to internally rotate R foot Standing ITB stretch against doorway x30 Seated hamstring curl red TB 2x10 Seated knee ext red TB 2x10 Seated figure 4 stretch x 30 Supine bridge 2x10x3 Supine march red TB 2x10 Supine clamshell red TB 2x10x3    PATIENT EDUCATION: Education details: HEP updates Person educated: Patient Education method: Explanation, Demonstration, and Handouts Education comprehension: verbalized understanding, returned demonstration, and needs further education  HOME EXERCISE PROGRAM: Access Code: [email or web address] URL: [email or web address] Date: 06/25/2024 Prepared by: Natalie Mceuen April Earnie Starring  Exercises - Seated Hamstring Stretch  - 1 x daily - 7 x weekly - 2 sets - 30 sec hold - Seated Hip Adductor Stretch  - 1 x daily - 7 x weekly - 2 sets - 30 sec hold - Long Sitting Straight Leg Raise with External Rotation  - 1 x daily - 7 x weekly - 2 sets - 10 reps - Seated Heel Slide  - 1 x daily - 7 x weekly - 3 sets - 10 reps - Seated Quadriceps Stretch From Chair With Belt  - 1 x daily - 7 x weekly - 2 sets - 30 sec hold - Standing Quad Stretch with Strap  - 1 x daily - 7 x weekly - 3 sets - 10 reps - Hip Extension with Resistance Loop  - 1 x daily - 7 x weekly - 2 sets - 10 reps  ASSESSMENT:  CLINICAL IMPRESSION: Pt getting some ITB irritation from her hip ext exercises -- modified to bridging and added clamshells to improve lateral stability. No pain with full knee ROM. Provided stretches to address new lateral leg pain. Posterior knee pain has improved and she reports only feeling it if she steps up with the R leg. Provided progressive strengthening exercises for hamstrings  and quads today without any irritation. Will hopefully be ready to work on more standing/weightbearing exercises next session without pain.   From eval: Patient is a 74 y.o. F  who was seen today for physical therapy evaluation and treatment for bilat knee pain s/p fall in June. PMH is significant for prior knee injuries and falls since 2021. Assessment demos gross bilat LE weakness with pain most notable with hamstring activation on R indicating possible hamstring strain. Pt will benefit from PT to improve on these issues to ease her ability to ascend/descend stairs to continue her normal wellness routine/activities.    OBJECTIVE IMPAIRMENTS: Abnormal gait, decreased activity tolerance, decreased balance, decreased coordination, decreased endurance, decreased mobility, difficulty walking, decreased strength, increased edema, impaired sensation, improper body mechanics, postural dysfunction, and pain.    GOALS: Goals reviewed with patient? Yes  SHORT TERM GOALS: Target date: 07/06/2024   Pt will be ind with initial HEP Baseline: Goal status: MET 07/02/24  2.  Pt will demo pain free knee AROM Baseline:  Goal status: MET 07/02/24   LONG TERM GOALS: Target date: 07/27/2024   Pt will be ind with management and progression of HEP Baseline:  Goal status: INITIAL  2.  Pt will have improved PSFS average score to >/=4.67 to demo MCID Baseline: 2.67 Goal status: INITIAL  3.  Pt will have improved 5x STS to </=13 sec for decreased fall risk Baseline:  Goal status: INITIAL  4.  Pt will demo at least 4/5 bilat LE strength for improved stability and strength for stairs Baseline:  Goal status: INITIAL    PLAN: PT FREQUENCY: 1-2x/week  PT DURATION: 6 weeks  PLANNED INTERVENTIONS: 97164- PT Re-evaluation, 97750- Physical Performance Testing, 97110-Therapeutic exercises, 97530- Therapeutic activity, 97112- Neuromuscular re-education, 97535- Self Care, [id number]- Manual therapy, [id number]- Gait training, [phone]- Aquatic Therapy, [phone]- Electrical stimulation (unattended), 97016- Vasopneumatic device, [id number]- Ultrasound, [id number]- Ionotophoresis 4mg /ml Dexamethasone , 79439 (1-2  muscles), 20561 (3+ muscles)- Dry Needling, Patient/Family education, Balance training, Stair training, Taping, Joint mobilization, Spinal mobilization, Cryotherapy, and Moist heat  PLAN FOR NEXT SESSION: Assess response to HEP. Stretch/strengthen quads/hamstring, hips. More resistance/weight bearing type exercises for home. Consider stairs/steps training and work on Geologist, engineering.    Shemar Plemmons April Ma L Asser Lucena, PT, DPT 07/02/2024, 11:01 AM

## 2024-07-21 ENCOUNTER — Ambulatory Visit (INDEPENDENT_AMBULATORY_CARE_PROVIDER_SITE_OTHER): Payer: Self-pay | Admitting: Physical Therapy

## 2024-07-21 ENCOUNTER — Encounter: Payer: Self-pay | Admitting: Physical Therapy

## 2024-07-21 DIAGNOSIS — R2689 Other abnormalities of gait and mobility: Secondary | ICD-10-CM

## 2024-07-21 DIAGNOSIS — M25562 Pain in left knee: Secondary | ICD-10-CM

## 2024-07-21 DIAGNOSIS — M25561 Pain in right knee: Secondary | ICD-10-CM | POA: Diagnosis not present

## 2024-07-21 DIAGNOSIS — R600 Localized edema: Secondary | ICD-10-CM

## 2024-07-21 DIAGNOSIS — M6281 Muscle weakness (generalized): Secondary | ICD-10-CM

## 2024-07-21 NOTE — Therapy (Signed)
 OUTPATIENT PHYSICAL THERAPY TREATMENT   Patient Name: Morgan [name] MRN: [id number] DOB:Oct 28, 1951, 73 y.o., female Today's Date: 07/21/2024  END OF SESSION:  PT End of Session - 07/21/24 0801     Visit Number 4    Number of Visits 7    Date for PT Re-Evaluation 07/27/24    Authorization Type Medicare    Progress Note Due on Visit 10    PT Start Time 0801    PT Stop Time 0842    PT Time Calculation (min) 41 min    Activity Tolerance Patient tolerated treatment well           Past Medical History:  Diagnosis Date   Abnormal chest x-ray    Abnormal electrocardiogram    Abscess of sigmoid colon due to diverticulitis 11/04/2017   Allergic rhinitis    Allergy    Cancer (HCC)    Skin -sqameous left hand basal cell face   Cataract    MD just watching   Colon polyp    polypoid colorectal mucosa/adenomatous   Complication of anesthesia    and cries   Contact dermatitis due to poison ivy    Cystitis    Diverticulitis with abscess status post robotic low anterior rectosigmoid resection 12/11/2017 12/11/2017   Diffuse diverticulosis entire colon by colonoscopy 01/2019   Diverticulosis of colon    Fatty liver disease, nonalcoholic    GERD (gastroesophageal reflux disease)    occasional   Hearing loss    bilateral, wears hearing aids   Hemorrhoids    Hyperlipidemia    Hypertension    Lymphocytosis    Overweight(278.02)    Pneumonia    2017   PONV (postoperative nausea and vomiting)    Psoriasis 12/27/2020   RA (rheumatoid arthritis) (HCC)    Rheumatoid   S/P reverse total shoulder arthroplasty, 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   Venous insufficiency    Past Surgical History:  Procedure Laterality Date   APPLICATION OF A-CELL OF EXTREMITY Left 06/09/2020   Procedure: APPLICATION OF INTEGRA BILAYER OR PRIMATRIX;  Surgeon: Elisabeth Craig RAMAN, MD;  Location: [facility] OR;  Service: Plastics;  Laterality: Left;   APPLICATION OF WOUND VAC Left 06/09/2020   Procedure: APPLICATION OF WOUND VAC;   Surgeon: Elisabeth Craig RAMAN, MD;  Location: [facility] OR;  Service: Plastics;  Laterality: Left;   APPLICATION OF WOUND VAC Left 07/26/2020   Procedure: APPLICATION OF WOUND VAC;  Surgeon: Elisabeth Craig RAMAN, MD;  Location: [facility] OR;  Service: Plastics;  Laterality: Left;  APPLICATION OF WOUND VAC   CATARACT EXTRACTION W/ INTRAOCULAR LENS IMPLANT Bilateral 2022   CESAREAN SECTION     [phone]   chemical and laser endovenous ablation of LE veins  2008, 2009, 2010, 2011   Dr Doreene et al   COLONOSCOPY     multiple   DRESSING CHANGE UNDER ANESTHESIA Right 04/24/2020   Procedure: DRESSING CHANGE UNDER ANESTHESIA right knee;  Surgeon: Gerome Charleston, MD;  Location: [facility];  Service: Orthopedics;  Laterality: Right;   endoscopy     multiple   FOOT SURGERY     left   I & D EXTREMITY Left 06/09/2020   Procedure: Debridement of left knee wound;  Surgeon: Elisabeth Craig RAMAN, MD;  Location: [facility] OR;  Service: Plastics;  Laterality: Left;  total case 45 min   INCISION AND DRAINAGE Bilateral 04/22/2020   Procedure: INCISION AND DRAINAGE, WOUND VAC PLACEMENT ON LEFT KNEE;  Surgeon: Fidel Rogue, MD;  Location: [facility];  Service: Orthopedics;  Laterality: Bilateral;   INCISION AND DRAINAGE ABSCESS Left 04/24/2020   Procedure: INCISION AND DRAINAGE LEFT KNEE;  Surgeon: Gerome Charleston, MD;  Location: [facility];  Service: Orthopedics;  Laterality: Left;   INNER EAR SURGERY Right    childhood; right, ear drum repair   LAPAROSCOPIC LYSIS OF ADHESIONS N/A 12/11/2017   Procedure: LAPAROSCOPIC LYSIS OF ADHESIONS;  Surgeon: Sheldon Standing, MD;  Location: [facility];  Service: General;  Laterality: N/A;   NASAL SEPTUM SURGERY     POLYPECTOMY     PROCTOSCOPY N/A 12/11/2017   Procedure: RIDGED PROCTOSCOPY;  Surgeon: Sheldon Standing, MD;  Location: [facility];  Service: General;  Laterality: N/A;   REVERSE SHOULDER ARTHROPLASTY Right 09/10/2019   Procedure: REVERSE SHOULDER ARTHROPLASTY;  Surgeon: Melita Drivers, MD;  Location: [facility];   Service: Orthopedics;  Laterality: Right;    SKIN SPLIT GRAFT Left 07/26/2020   Procedure: SKIN GRAFT SPLIT THICKNESS;  Surgeon: Elisabeth Craig RAMAN, MD;  Location: [facility];  Service: Plastics;  Laterality: Left;  SKIN GRAFT SPLIT THICKNESS   TONSILLECTOMY     TOTAL ABDOMINAL HYSTERECTOMY  11/1985   TYMPANOPLASTY Right 11/03/2018   Procedure: TYMPANOPLASTY;  Surgeon: Carlie Clark, MD;  Location: [facility];  Service: ENT;  Laterality: Right;   Patient Active Problem List   Diagnosis Date Noted   Screening for osteoporosis 10/09/2021   Psoriasis 12/27/2020   Bilateral sensorineural hearing loss 02/02/2019   H/O partial resection of colon 01/31/2018   Rheumatoid arthritis involving multiple sites with positive rheumatoid factor (HCC) 10/31/2017   Immunosuppressed status (HCC) 10/08/2017   Obesity with body mass index 30 or greater 01/25/2017   Current chronic use of systemic steroids 06/08/2016   Uses hearing aid 12/09/2015   Leukocytosis 08/27/2014   Thrombocytopenia (HCC) 11/23/2013   Fatty liver disease, nonalcoholic 11/24/2010   Benign colon polyp 10/20/2008   Essential hypertension 10/20/2008   Diverticulosis of colon 10/20/2008   Mixed hyperlipidemia 04/23/2008   Venous (peripheral) insufficiency 04/23/2008   Allergic rhinitis 04/23/2008   Gastroesophageal reflux disease without esophagitis 04/23/2008    PCP: Jodie Lavern CROME, MD  REFERRING PROVIDER: Joane Artist RAMAN, MD  REFERRING DIAG: [phone] (ICD-10-CM) - Acute pain of both knees M25.511 (ICD-10-CM) - Acute pain of right shoulder  THERAPY DIAG:  Acute pain of both knees  Muscle weakness (generalized)  Other abnormalities of gait and mobility  Localized edema  Rationale for Evaluation and Treatment: Rehabilitation  ONSET DATE: 05/12/24 fall on knees  SUBJECTIVE:  SUBJECTIVE STATEMENT: Knees are doing much better.    From eval: Pt notes a fall in June landing straight on her knees. Has been using ice on bilat knees since the fall. Pt states she is not able to go up stairs like she was able to before -- has to go one step up at a time. Not just pain in her knees, she is getting a pain in the back of her R knee to her buttock. States it grabs (R LE is normally stronger than L LE) and has nearly made her fall. Goes to [employer] and walks the track but has to go up 24 steps. Feels that her knees are still swollen  PERTINENT HISTORY: Prior bilat knee injuries, RA, history of falls since 2021,   PAIN:  Are you having pain? Yes: NPRS scale: 2/10 currently  Pain location: R posterior knee, L anterior knee Pain description: Grabs Aggravating factors: Steps, uneven surfaces Relieving factors: Ice, advil  PRECAUTIONS: Fall  RED FLAGS: None   WEIGHT BEARING RESTRICTIONS: No  FALLS:  Has patient fallen in last 6 months? Yes. 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  DIAGNOSTIC FINDINGS:  R knee x-rays 05/13/24 IMPRESSION: 1. Mild patellofemoral osteoarthritis. 2. Possible small soft tissue injury within the prepatellar soft tissues.  L knee x-ray 05/13/24 IMPRESSION: Small joint effusion. No acute fracture.  PATIENT SURVEYS :  PSFS: THE PATIENT SPECIFIC FUNCTIONAL SCALE  Place score of 0-10 (0 = unable to perform activity and 10 = able  to perform activity at the same level as before injury or problem)  Activity Date: 06/15/24    Going up/down stairs 2    2.  Bending knees 2    3.  Walking on uneven surfaces 4    4.      Total Score 8/3 = 2.67      Total Score = Sum of activity scores/number of activities  Minimally Detectable Change: 3 points (for single activity); 2 points (for average score)  [facility] (nd). The Patient Specific Functional Scale . Retrieved from [email or web address]       SENSATION: Tingling bilat anterior knees  POSTURE: Rounded shoulder, fwd flexed trunk   LOWER EXTREMITY ROM:     Active  Right eval Left eval  Hip flexion    Hip extension    Hip abduction    Hip adduction    Hip internal rotation    Hip external rotation    Knee flexion 115 pull in back of knee 115  Knee extension 5 5  Ankle dorsiflexion    Ankle plantarflexion    Ankle inversion    Ankle eversion     (Blank rows = not tested)    LOWER EXTREMITY MMT:    MMT Right eval Left eval  Hip flexion 3 3+  Hip extension 3- 3  Hip abduction 3+ 3+  Hip adduction 4- 4  Hip internal rotation    Hip external rotation    Knee flexion 3+ pain 3+  Knee extension 3+  pain 4 pain  Ankle dorsiflexion    Ankle plantarflexion    Ankle inversion    Ankle eversion     (Blank rows = not tested)   FUNCTIONAL TESTS: 5x STS: 19.34 sec  PALPATION:  TTP R medial hamstring/adductor, bilat adductors  Edema noted along R medial knee                                                                                                                           [facility] Adult PT Treatment 07/21/24 TherEx Recumbent bike L3 x 5 min Supine quad stretch 3x30 sec bil with strap Supine piriformis stretch 3x30 sec bil (knee to opposite shoulder)  TherAct TRX squats 2x10 LLE on 6 step with Rt heel taps 2x10; then RLE on 4 step with Lt heel taps  2x10    07/02/24 Therapeutic Exercise: Recumbent bike L1 x 6 min Standing runner's stretch 2x30 cues to internally rotate R foot Standing ITB stretch against doorway x30 Seated hamstring curl red TB 2x10 Seated knee ext red TB 2x10 Seated figure 4 stretch x 30 Supine bridge 2x10x3 Supine march red TB 2x10 Supine clamshell red TB 2x10x3    PATIENT EDUCATION: Education details: HEP updates Person educated: Patient Education method: Explanation, Demonstration, and Handouts Education comprehension: verbalized understanding, returned demonstration, and needs further education  HOME EXERCISE PROGRAM: Access Code: [email or web address] URL: [email or web address] Date: 07/21/2024 Prepared by: Corean Ku  Exercises - Seated Hamstring Stretch  - 1 x daily - 7 x weekly - 2 sets - 30 sec hold - Seated Hip Adductor Stretch  - 1 x daily - 7 x weekly - 2 sets - 30 sec hold - Gastroc Stretch on Wall (Mirrored)  - 1 x daily - 7 x weekly - 2 sets - 30 sec hold - ITB Stretch at Wall  - 1 x daily - 7 x weekly - 2 sets - 30 sec hold - Seated Hamstring Curls with Resistance  - 1 x daily - 7 x weekly - 2 sets - 10 reps - Seated Knee Extension with Resistance  - 1 x daily - 7 x weekly - 2 sets - 10 reps - Supine Bridge  - 1 x daily - 7 x weekly - 2 sets - 10 reps - Hooklying Isometric Clamshell  - 1 x daily - 7 x weekly - 2 sets - 10 reps - Supine Piriformis Stretch with Foot on Ground  - 1 x daily - 7 x weekly - 1 sets - 2 reps - 30 sec hold - Supine Quadriceps Stretch with Strap on Table  - 1 x daily - 7 x weekly - 1 sets - 2 reps - 30 sec hold  ASSESSMENT:  CLINICAL IMPRESSION: Adjusted HEP today to address stretches that caused pain in her hip.  Overall progressing well with PT and will continue to benefit from PT to maximize function.   From eval: Patient is a 73 y.o. F who was seen today for  physical therapy evaluation and treatment for bilat knee pain s/p fall in June. PMH  is significant for prior knee injuries and falls since 2021. Assessment demos gross bilat LE weakness with pain most notable with hamstring activation on R indicating possible hamstring strain. Pt will benefit from PT to improve on these issues to ease her ability to ascend/descend stairs to continue her normal wellness routine/activities.    OBJECTIVE IMPAIRMENTS: Abnormal gait, decreased activity tolerance, decreased balance, decreased coordination, decreased endurance, decreased mobility, difficulty walking, decreased strength, increased edema, impaired sensation, improper body mechanics, postural dysfunction, and pain.    GOALS: Goals reviewed with patient? Yes  SHORT TERM GOALS: Target date: 07/06/2024   Pt will be ind with initial HEP Baseline: Goal status: MET 07/02/24  2.  Pt will demo pain free knee AROM Baseline:  Goal status: MET 07/02/24   LONG TERM GOALS: Target date: 07/27/2024   Pt will be ind with management and progression of HEP Baseline:  Goal status: INITIAL  2.  Pt will have improved PSFS average score to >/=4.67 to demo MCID Baseline: 2.67 Goal status: INITIAL  3.  Pt will have improved 5x STS to </=13 sec for decreased fall risk Baseline:  Goal status: INITIAL  4.  Pt will demo at least 4/5 bilat LE strength for improved stability and strength for stairs Baseline:  Goal status: INITIAL    PLAN: PT FREQUENCY: 1-2x/week  PT DURATION: 6 weeks  PLANNED INTERVENTIONS: 97164- PT Re-evaluation, 97750- Physical Performance Testing, 97110-Therapeutic exercises, 97530- Therapeutic activity, [id number]- Neuromuscular re-education, 97535- Self Care, [id number]- Manual therapy, [id number]- Gait training, [phone]- Aquatic Therapy, [phone]- Electrical stimulation (unattended), 97016- Vasopneumatic device, [id number]- Ultrasound, [id number]- Ionotophoresis 4mg /ml Dexamethasone , 79439 (1-2 muscles), 20561 (3+ muscles)- Dry Needling, Patient/Family education, Balance training, Stair training,  Taping, Joint mobilization, Spinal mobilization, Cryotherapy, and Moist heat  PLAN FOR NEXT SESSION: will need recert,  Assess response to HEP. Stretch/strengthen quads/hamstring, hips. More resistance/weight bearing type exercises for home. Consider stairs/steps training and work on Geologist, engineering.    Corean JULIANNA Ku, PT, DPT 07/21/24 8:44 AM

## 2024-07-27 ENCOUNTER — Ambulatory Visit (INDEPENDENT_AMBULATORY_CARE_PROVIDER_SITE_OTHER): Admitting: Physical Therapy

## 2024-07-27 ENCOUNTER — Encounter: Payer: Self-pay | Admitting: Physical Therapy

## 2024-07-27 DIAGNOSIS — M6281 Muscle weakness (generalized): Secondary | ICD-10-CM | POA: Diagnosis not present

## 2024-07-27 DIAGNOSIS — M25562 Pain in left knee: Secondary | ICD-10-CM

## 2024-07-27 DIAGNOSIS — M25561 Pain in right knee: Secondary | ICD-10-CM

## 2024-07-27 NOTE — Therapy (Signed)
 OUTPATIENT PHYSICAL THERAPY TREATMENT/  D/C   Patient Name: Morgan [name] MRN: [id number] DOB:26-Apr-1951, 73 y.o., female Today's Date: 07/27/2024  END OF SESSION:  PT End of Session - 07/27/24 0851     Visit Number 5    Number of Visits 7    Date for PT Re-Evaluation 07/27/24    Authorization Type Medicare    Progress Note Due on Visit 10    PT Start Time 0850    PT Stop Time 0928    PT Time Calculation (min) 38 min    Activity Tolerance Patient tolerated treatment well           Past Medical History:  Diagnosis Date   Abnormal chest x-ray    Abnormal electrocardiogram    Abscess of sigmoid colon due to diverticulitis 11/04/2017   Allergic rhinitis    Allergy    Cancer (HCC)    Skin -sqameous left hand basal cell face   Cataract    MD just watching   Colon polyp    polypoid colorectal mucosa/adenomatous   Complication of anesthesia    and cries   Contact dermatitis due to poison ivy    Cystitis    Diverticulitis with abscess status post robotic low anterior rectosigmoid resection 12/11/2017 12/11/2017   Diffuse diverticulosis entire colon by colonoscopy 01/2019   Diverticulosis of colon    Fatty liver disease, nonalcoholic    GERD (gastroesophageal reflux disease)    occasional   Hearing loss    bilateral, wears hearing aids   Hemorrhoids    Hyperlipidemia    Hypertension    Lymphocytosis    Overweight(278.02)    Pneumonia    2017   PONV (postoperative nausea and vomiting)    Psoriasis 12/27/2020   RA (rheumatoid arthritis) (HCC)    Rheumatoid   S/P reverse total shoulder arthroplasty, 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   Venous insufficiency    Past Surgical History:  Procedure Laterality Date   APPLICATION OF A-CELL OF EXTREMITY Left 06/09/2020   Procedure: APPLICATION OF INTEGRA BILAYER OR PRIMATRIX;  Surgeon: Elisabeth Craig RAMAN, MD;  Location: [facility] OR;  Service: Plastics;  Laterality: Left;   APPLICATION OF WOUND VAC Left 06/09/2020   Procedure: APPLICATION OF WOUND  VAC;  Surgeon: Elisabeth Craig RAMAN, MD;  Location: [facility] OR;  Service: Plastics;  Laterality: Left;   APPLICATION OF WOUND VAC Left 07/26/2020   Procedure: APPLICATION OF WOUND VAC;  Surgeon: Elisabeth Craig RAMAN, MD;  Location: [facility] OR;  Service: Plastics;  Laterality: Left;  APPLICATION OF WOUND VAC   CATARACT EXTRACTION W/ INTRAOCULAR LENS IMPLANT Bilateral 2022   CESAREAN SECTION     [id number]   chemical and laser endovenous ablation of LE veins  2008, 2009, 2010, 2011   Dr Doreene et al   COLONOSCOPY     multiple   DRESSING CHANGE UNDER ANESTHESIA Right 04/24/2020   Procedure: DRESSING CHANGE UNDER ANESTHESIA right knee;  Surgeon: Gerome Charleston, MD;  Location: [facility];  Service: Orthopedics;  Laterality: Right;   endoscopy     multiple   FOOT SURGERY     left   I & D EXTREMITY Left 06/09/2020   Procedure: Debridement of left knee wound;  Surgeon: Elisabeth Craig RAMAN, MD;  Location: [facility] OR;  Service: Plastics;  Laterality: Left;  total case 45 min   INCISION AND DRAINAGE Bilateral 04/22/2020   Procedure: INCISION AND DRAINAGE, WOUND VAC PLACEMENT ON LEFT KNEE;  Surgeon: Fidel Rogue, MD;  Location: [facility];  Service:  Orthopedics;  Laterality: Bilateral;   INCISION AND DRAINAGE ABSCESS Left 04/24/2020   Procedure: INCISION AND DRAINAGE LEFT KNEE;  Surgeon: Gerome Charleston, MD;  Location: [facility];  Service: Orthopedics;  Laterality: Left;   INNER EAR SURGERY Right    childhood; right, ear drum repair   LAPAROSCOPIC LYSIS OF ADHESIONS N/A 12/11/2017   Procedure: LAPAROSCOPIC LYSIS OF ADHESIONS;  Surgeon: Sheldon Standing, MD;  Location: [facility];  Service: General;  Laterality: N/A;   NASAL SEPTUM SURGERY     POLYPECTOMY     PROCTOSCOPY N/A 12/11/2017   Procedure: RIDGED PROCTOSCOPY;  Surgeon: Sheldon Standing, MD;  Location: [facility];  Service: General;  Laterality: N/A;   REVERSE SHOULDER ARTHROPLASTY Right 09/10/2019   Procedure: REVERSE SHOULDER ARTHROPLASTY;  Surgeon: Melita Drivers, MD;  Location: [facility];  Service: Orthopedics;  Laterality: Right;    SKIN SPLIT GRAFT Left 07/26/2020   Procedure: SKIN GRAFT SPLIT THICKNESS;  Surgeon: Elisabeth Craig RAMAN, MD;  Location: [facility] OR;  Service: Plastics;  Laterality: Left;  SKIN GRAFT SPLIT THICKNESS   TONSILLECTOMY     TOTAL ABDOMINAL HYSTERECTOMY  11/1985   TYMPANOPLASTY Right 11/03/2018   Procedure: TYMPANOPLASTY;  Surgeon: Carlie Clark, MD;  Location: [facility];  Service: ENT;  Laterality: Right;   Patient Active Problem List   Diagnosis Date Noted   Screening for osteoporosis 10/09/2021   Psoriasis 12/27/2020   Bilateral sensorineural hearing loss 02/02/2019   H/O partial resection of colon 01/31/2018   Rheumatoid arthritis involving multiple sites with positive rheumatoid factor (HCC) 10/31/2017   Immunosuppressed status (HCC) 10/08/2017   Obesity with body mass index 30 or greater 01/25/2017   Current chronic use of systemic steroids 06/08/2016   Uses hearing aid 12/09/2015   Leukocytosis 08/27/2014   Thrombocytopenia (HCC) 11/23/2013   Fatty liver disease, nonalcoholic 11/24/2010   Benign colon polyp 10/20/2008   Essential hypertension 10/20/2008   Diverticulosis of colon 10/20/2008   Mixed hyperlipidemia 04/23/2008   Venous (peripheral) insufficiency 04/23/2008   Allergic rhinitis 04/23/2008   Gastroesophageal reflux disease without esophagitis 04/23/2008    PCP: Jodie Lavern CROME, MD  REFERRING PROVIDER: Joane Artist RAMAN, MD  REFERRING DIAG: [phone] (ICD-10-CM) - Acute pain of both knees M25.511 (ICD-10-CM) - Acute pain of right shoulder  THERAPY DIAG:  Acute pain of both knees  Muscle weakness (generalized)  Rationale for Evaluation and Treatment: Rehabilitation  ONSET DATE: 05/12/24 fall on knees  SUBJECTIVE:  SUBJECTIVE STATEMENT: Pt states doing very well. Thinks she is ready for d/c. Having very minimal soreness, only feeling R knee a bit when doing more stairs. Just went to beach, walked on beach without a problem, and also went up and down lots of stairs each day.   From eval: Pt notes a fall in June landing straight on her knees. Has been using ice on bilat knees since the fall. Pt states she is not able to go up stairs like she was able to before -- has to go one step up at a time. Not just pain in her knees, she is getting a pain in the back of her R knee to her buttock. States it grabs (R LE is normally stronger than L LE) and has nearly made her fall. Goes to [employer] and walks the track but has to go up 24 steps. Feels that her knees are still swollen  PERTINENT HISTORY: Prior bilat knee injuries, RA, history of falls since 2021,   PAIN:  Are you having pain? Yes: NPRS scale: 2/10 currently  Pain location: R posterior knee, L anterior knee Pain description: Grabs Aggravating factors: Steps, uneven surfaces Relieving factors: Ice, advil  PRECAUTIONS: Fall  RED FLAGS: None   WEIGHT BEARING RESTRICTIONS: No  FALLS:  Has patient fallen in last 6 months? Yes. 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  DIAGNOSTIC FINDINGS:  R knee x-rays 05/13/24 IMPRESSION: 1. Mild patellofemoral osteoarthritis. 2. Possible small soft tissue injury within the prepatellar soft tissues.  L knee x-ray 05/13/24 IMPRESSION: Small joint  effusion. No acute fracture.  PATIENT SURVEYS :  PSFS: THE PATIENT SPECIFIC FUNCTIONAL SCALE  Place score of 0-10 (0 = unable to perform activity and 10 = able to perform activity at the same level as before injury or problem)  Activity Date: 06/15/24 07/27/24   Going up/down stairs 2   8   2.  Bending knees 2   5   3.  Walking on uneven surfaces 4   9   4.      Total Score 8/3 = 2.67  7.3     Total Score = Sum of activity scores/number of activities  Minimally Detectable Change: 3 points (for single activity); 2 points (for average score)  [facility] (nd). The Patient Specific Functional Scale . Retrieved from [email or web address]       SENSATION: Tingling bilat anterior knees  POSTURE: Rounded shoulder, fwd flexed trunk   LOWER EXTREMITY ROM:     Active  Right eval Left eval  Hip flexion    Hip extension    Hip abduction    Hip adduction    Hip internal rotation    Hip external rotation    Knee flexion 115 pull in back of knee 115  Knee extension 5 5  Ankle dorsiflexion    Ankle plantarflexion    Ankle inversion    Ankle eversion     (Blank rows = not tested)    LOWER EXTREMITY MMT:  MMT Right eval Left eval Right 07/27/2024  Left 07/27/2024   Hip flexion 3 3+ 4 4  Hip extension 3- 3    Hip abduction 3+ 3+ 4 4  Hip adduction 4- 4    Hip internal rotation      Hip external rotation      Knee flexion 3+ pain 3+ 4+ 5  Knee extension 3+ pain 4 pain 5 5  Ankle dorsiflexion      Ankle plantarflexion      Ankle inversion      Ankle eversion       (Blank rows = not tested)   FUNCTIONAL TESTS: 5x STS: 12.56 sec   PALPATION:                                                      [facility] Adult PT Treatment  07/27/24: Ther Ex: Supine:  Fig 4  30 sec x 3 bil;   bridging 2 x 10;  Seated:  Hamstring stretch x 3 bil;   HSC  RTB x 15 bil;  Standing:  Marching x 20;  tandem stance x 3 bil;    Ther Act:  Sit to stand:  x 10 mat tabe,   x 10 chair  Step ups : 6 in x 15 bil;  stairs up/down 5 steps x 6 , 1 light UE support;      07/21/24 TherEx Recumbent bike L3 x 5 min Supine quad stretch 3x30 sec bil with strap Supine piriformis stretch 3x30 sec bil (knee to opposite shoulder)  TherAct TRX squats 2x10 LLE on 6 step with Rt heel taps 2x10; then RLE on 4 step with Lt heel taps 2x10    07/02/24 Therapeutic Exercise: Recumbent bike L1 x 6 min Standing runner's stretch 2x30 cues to internally rotate R foot Standing ITB stretch against doorway x30 Seated hamstring curl red TB 2x10 Seated knee ext red TB 2x10 Seated figure 4 stretch x 30 Supine bridge 2x10x3 Supine march red TB 2x10 Supine clamshell red TB 2x10x3    PATIENT EDUCATION: Education details: HEP updated  Person educated: Patient Education method: Explanation, Demonstration, and Handouts Education comprehension: verbalized understanding, returned demonstration, and needs further education  HOME EXERCISE PROGRAM: Access Code: [email or web address] URL: [email or web address] Date: 07/21/2024 Prepared by: Corean Ku  Exercises - Seated Hamstring Stretch  - 1 x daily - 7 x weekly - 2 sets - 30 sec hold - Seated Hip Adductor Stretch  - 1 x daily - 7 x weekly - 2 sets - 30 sec hold - Gastroc Stretch on Wall (Mirrored)  - 1 x daily - 7 x weekly - 2 sets - 30 sec hold - ITB Stretch at Wall  - 1 x daily - 7 x weekly - 2 sets - 30 sec hold - Seated Hamstring Curls with Resistance  - 1 x daily - 7 x weekly - 2 sets - 10 reps - Seated Knee Extension with Resistance  - 1 x daily - 7 x weekly - 2 sets - 10 reps - Supine Bridge  - 1 x daily - 7 x weekly - 2 sets - 10 reps - Hooklying Isometric Clamshell  - 1 x daily - 7 x weekly - 2 sets - 10 reps - Supine Piriformis Stretch with Foot on Ground  - 1 x daily -  7 x weekly - 1 sets - 2 reps - 30 sec hold - Supine Quadriceps Stretch with Strap on  Table  - 1 x daily - 7 x weekly - 1 sets - 2 reps - 30 sec hold  ASSESSMENT:  CLINICAL IMPRESSION: Pt doing very well, good tolerance for all activities. No remaining pain. Much improved ability for strength and stairs. She  has met goals at this time and is ready for d/c to HEP. Final HEP reviewed in detail today, and importance of continuing mobility. Discussed options for the gym and pool.    From eval: Patient is a 73 y.o. F who was seen today for physical therapy evaluation and treatment for bilat knee pain s/p fall in June. PMH is significant for prior knee injuries and falls since 2021. Assessment demos gross bilat LE weakness with pain most notable with hamstring activation on R indicating possible hamstring strain. Pt will benefit from PT to improve on these issues to ease her ability to ascend/descend stairs to continue her normal wellness routine/activities.    OBJECTIVE IMPAIRMENTS: Abnormal gait, decreased activity tolerance, decreased balance, decreased coordination, decreased endurance, decreased mobility, difficulty walking, decreased strength, increased edema, impaired sensation, improper body mechanics, postural dysfunction, and pain.    GOALS: Goals reviewed with patient? Yes  SHORT TERM GOALS: Target date: 07/06/2024   Pt will be ind with initial HEP Baseline: Goal status: MET 07/02/24  2.  Pt will demo pain free knee AROM Baseline:  Goal status: MET 07/02/24   LONG TERM GOALS: Target date: 07/27/2024   Pt will be ind with management and progression of HEP Baseline:  Goal status: MET  2.  Pt will have improved PSFS average score to >/=4.67 to demo MCID Baseline: 2.67 Goal status: MET/ 7.3  3.  Pt will have improved 5x STS to </=13 sec for decreased fall risk Baseline:  Goal status: MET  4.  Pt will demo at least 4/5 bilat LE strength for improved stability and strength for stairs Baseline:  Goal status: MET    PLAN: PT FREQUENCY: 1-2x/week  PT  DURATION: 6 weeks  PLANNED INTERVENTIONS: 97164- PT Re-evaluation, 97750- Physical Performance Testing, 97110-Therapeutic exercises, 97530- Therapeutic activity, 97112- Neuromuscular re-education, 97535- Self Care, 02859- Manual therapy, [phone]- Gait training, [phone]- Aquatic Therapy, [phone]- Electrical stimulation (unattended), 97016- Vasopneumatic device, [id number]- Ultrasound, [id number]- Ionotophoresis 4mg /ml Dexamethasone , 79439 (1-2 muscles), 20561 (3+ muscles)- Dry Needling, Patient/Family education, Balance training, Stair training, Taping, Joint mobilization, Spinal mobilization, Cryotherapy, and Moist heat  PLAN FOR NEXT SESSION:   Tinnie Don, PT, DPT 12:10 PM  07/27/24   PHYSICAL THERAPY DISCHARGE SUMMARY  Visits from Start of Care: 5   Plan: Patient agrees to discharge.  Patient goals were met. Patient is being discharged due to meeting the stated rehab goals.     Tinnie Don, PT, DPT 12:10 PM  07/27/24

## 2024-07-29 ENCOUNTER — Encounter: Payer: Self-pay | Admitting: Family Medicine

## 2024-08-06 ENCOUNTER — Encounter: Admitting: Physical Therapy

## 2024-08-07 ENCOUNTER — Encounter: Payer: Self-pay | Admitting: Family Medicine

## 2024-08-07 ENCOUNTER — Telehealth (INDEPENDENT_AMBULATORY_CARE_PROVIDER_SITE_OTHER): Admitting: Family Medicine

## 2024-08-07 VITALS — Ht 64.0 in | Wt 177.0 lb

## 2024-08-07 DIAGNOSIS — F4322 Adjustment disorder with anxiety: Secondary | ICD-10-CM | POA: Diagnosis not present

## 2024-08-07 MED ORDER — ALPRAZOLAM 0.5 MG PO TABS: 0.2500 mg | ORAL_TABLET | Freq: Every day | ORAL | 0 refills | Status: DC | PRN

## 2024-08-07 NOTE — Progress Notes (Signed)
 Virtual Visit via Video Note  Subjective  CC:  Chief Complaint  Patient presents with   Anxiety    Pt stated that she has been experiencing some anxiety in the past 2/3 weeks     I connected with Montie SHAUNNA Molt on 08/07/24 at  8:00 AM EDT by a video enabled telemedicine application and verified that I am speaking with the correct person using two identifiers. Location patient: Home Location provider: [facility] [address] Office Persons participating in the virtual visit: Doreena, Maulden, MD Avelina Berber CMA  I discussed the limitations of evaluation and management by telemedicine and the availability of in person appointments. The patient expressed understanding and agreed to proceed. HPI: Morgan [name] is a 73 y.o. female who was contacted today to address the problems listed above in the chief complaint. Unfortunately, Morgan [name]'s husband was recently diagnosed with a likely lung cancer in the setting of near end-stage COPD.  Morgan [name] is appropriately concerned.  She reports episodes of anxiety described as a large pit in her stomach and some difficulty thinking.  Chest can feel tight.  No acute panic attacks.  She does however report that most days she is doing fine.  She is a little bit more emotionally labile.  However she does not feel overly down.  She is sleeping and eating well.  No history of chronic anxiety.  She notes that stressful especially since they are having serious conversations that can be difficult.  Her husband is coping well. Morgan [name] reports that she is one of her husbands Xanax  and found it to be very beneficial.  She would like some assistance with medications.  Assessment  1. Transient adjustment reaction with anxiety      Plan  Adjustment anxiety: We had a nice long conversation.  Counseling recommendations given.  No sign of chronic adjustment disorder or severe mood disorder at this time.  Discussed coping strategies.   Discussed options of treatment.  Agree, low dose, intermittent and short-term use of Xanax  could be helpful.  0.5 mg Xanax  prescribed.  Appropriate use and risks and benefits discussed in detail.  Follow up: As needed 09/28/2024   Meds ordered this encounter  Medications   ALPRAZolam  (XANAX ) 0.5 MG tablet    Sig: Take 0.5-1 tablets (0.25-0.5 mg total) by mouth daily as needed for anxiety.    Dispense:  20 tablet    Refill:  0      I reviewed the patients updated PMH, FH, and SocHx.    Patient Active Problem List   Diagnosis Date Noted   Psoriasis 12/27/2020    Priority: High   Rheumatoid arthritis involving multiple sites with positive rheumatoid factor (HCC) 10/31/2017    Priority: High   Immunosuppressed status (HCC) 10/08/2017    Priority: High   Obesity with body mass index 30 or greater 01/25/2017    Priority: High   Current chronic use of systemic steroids 06/08/2016    Priority: High   Essential hypertension 10/20/2008    Priority: High   Mixed hyperlipidemia 04/23/2008    Priority: High   H/O partial resection of colon 01/31/2018    Priority: Medium    Leukocytosis 08/27/2014    Priority: Medium    Fatty liver disease, nonalcoholic 11/24/2010    Priority: Medium    Benign colon polyp 10/20/2008    Priority: Medium    Diverticulosis of colon 10/20/2008    Priority: Medium  Venous (peripheral) insufficiency 04/23/2008    Priority: Medium    Gastroesophageal reflux disease without esophagitis 04/23/2008    Priority: Medium    Bilateral sensorineural hearing loss 02/02/2019    Priority: Low   Uses hearing aid 12/09/2015    Priority: Low   Thrombocytopenia (HCC) 11/23/2013    Priority: Low   Allergic rhinitis 04/23/2008    Priority: Low   Screening for osteoporosis 10/09/2021   Current Meds  Medication Sig   acetaminophen  (TYLENOL ) 500 MG tablet Take 1,000 mg by mouth every 6 (six) hours as needed for moderate pain or headache.    ALPRAZolam  (XANAX )  0.5 MG tablet Take 0.5-1 tablets (0.25-0.5 mg total) by mouth daily as needed for anxiety.   aspirin  EC 81 MG tablet Take 81 mg by mouth every Monday, Wednesday, and Friday. Swallow whole.   Calcium  Carbonate-Vitamin D  600-400 MG-UNIT tablet Take 1 tablet by mouth 2 (two) times daily.    certolizumab pegol (CIMZIA) 2 X 200 MG KIT as directed Subcutaneous   Cholecalciferol  (VITAMIN D ) 50 MCG (2000 UT) CAPS Take 2,000 Units by mouth daily.    clobetasol (TEMOVATE) 0.05 % external solution 1 application Externally Twice a day for 14 days   fenofibrate  160 MG tablet TAKE 1 TABLET BY MOUTH EVERYDAY AT BEDTIME   fexofenadine  (ALLEGRA ) 180 MG tablet Take 1 tablet (180 mg total) by mouth daily.   fluticasone  (FLONASE ) 50 MCG/ACT nasal spray SPRAY 2 SPRAYS INTO EACH NOSTRIL DAILY AS NEEDED FOR ALLERGIES   folic acid (FOLVITE) 1 MG tablet TAKE 1 TABLET BY MOUTH EVERY DAY FOR 90 DAYS Oral for 90 Days   furosemide  (LASIX ) 20 MG tablet Take 1 tablet (20 mg total) by mouth daily as needed for edema. Take with potassium   lisinopril  (ZESTRIL ) 40 MG tablet Take 1 tablet (40 mg total) by mouth daily.   methotrexate (RHEUMATREX) 2.5 MG tablet Take 15 mg by mouth once a week.   Multiple Vitamin (MULTIVITAMIN) tablet Take 1 tablet by mouth daily.   polyethylene glycol (MIRALAX ) 17 g packet Take 17 g by mouth daily.   potassium chloride  SA (KLOR-CON ) 20 MEQ tablet Take 1 tablet (20 mEq total) by mouth daily as needed (leg swelling). Take with furosemide    prednisoLONE acetate (PRED FORTE) 1 % ophthalmic suspension 1 drop 3 (three) times daily.   predniSONE  (DELTASONE ) 5 MG tablet Take 5 mg by mouth daily.   simvastatin  (ZOCOR ) 40 MG tablet Take 1 tablet (40 mg total) by mouth at bedtime.   valACYclovir  (VALTREX ) 500 MG tablet Take 500 mg by mouth 2 (two) times daily.    Allergies: Patient is allergic to remicade [infliximab], bee venom, poison ivy extract, adalimumab, golimumab , hydroxychloroquine , and secukinumab   (300 mg dose). Family History: Patient family history includes Arthritis in her mother; Atrial fibrillation in her mother; Breast cancer in an other family member; COPD in her mother; Colon cancer in an other family member; Colon polyps in an other family member; Diabetes in her maternal aunt, maternal uncle, and sister; Healthy in her sister; Heart disease in her mother and sister; Heart failure in her brother and son; Hyperlipidemia in her mother; Hypertension in her mother; Liver disease in her sister. Social History:  Patient  reports that she quit smoking about 22 years ago. Her smoking use included cigarettes. She started smoking about 37 years ago. She has a 7.5 pack-year smoking history. She has never used smokeless tobacco. She reports current alcohol  use. She reports that she does  not use drugs.  Review of Systems: Constitutional: Negative for fever malaise or anorexia Cardiovascular: negative for chest pain Respiratory: negative for SOB or persistent cough Gastrointestinal: negative for abdominal pain  OBJECTIVE Vitals: Ht 5' 4 (1.626 m)   Wt 177 lb (80.3 kg)   LMP  (LMP Unknown)   BMI 30.38 kg/m  General: no acute distress , A&Ox3 Psych: Appears well, normal speech, normal mood Morgan LITTIE Heck, MD

## 2024-08-12 ENCOUNTER — Telehealth: Payer: Self-pay

## 2024-08-12 ENCOUNTER — Other Ambulatory Visit

## 2024-08-12 ENCOUNTER — Encounter: Payer: Self-pay | Admitting: Internal Medicine

## 2024-08-12 ENCOUNTER — Ambulatory Visit (INDEPENDENT_AMBULATORY_CARE_PROVIDER_SITE_OTHER): Admitting: Internal Medicine

## 2024-08-12 ENCOUNTER — Other Ambulatory Visit: Payer: Self-pay | Admitting: Internal Medicine

## 2024-08-12 VITALS — BP 126/70 | HR 85 | Temp 97.4°F | Ht 64.0 in | Wt 177.2 lb

## 2024-08-12 DIAGNOSIS — J324 Chronic pansinusitis: Secondary | ICD-10-CM

## 2024-08-12 DIAGNOSIS — H7291 Unspecified perforation of tympanic membrane, right ear: Secondary | ICD-10-CM

## 2024-08-12 DIAGNOSIS — R5081 Fever presenting with conditions classified elsewhere: Secondary | ICD-10-CM

## 2024-08-12 DIAGNOSIS — R011 Cardiac murmur, unspecified: Secondary | ICD-10-CM

## 2024-08-12 DIAGNOSIS — H6691 Otitis media, unspecified, right ear: Secondary | ICD-10-CM | POA: Diagnosis not present

## 2024-08-12 DIAGNOSIS — R0981 Nasal congestion: Secondary | ICD-10-CM

## 2024-08-12 LAB — COMPREHENSIVE METABOLIC PANEL WITH GFR
ALT: 20 U/L (ref 0–35)
AST: 37 U/L (ref 0–37)
Albumin: 4.3 g/dL (ref 3.5–5.2)
Alkaline Phosphatase: 45 U/L (ref 39–117)
BUN: 18 mg/dL (ref 6–23)
CO2: 25 meq/L (ref 19–32)
Calcium: 10.6 mg/dL — ABNORMAL HIGH (ref 8.4–10.5)
Chloride: 103 meq/L (ref 96–112)
Creatinine, Ser: 0.84 mg/dL (ref 0.40–1.20)
GFR: 68.02 mL/min (ref >60.00)
Glucose, Bld: 101 mg/dL — ABNORMAL HIGH (ref 70–99)
Potassium: 4.1 meq/L (ref 3.5–5.1)
Sodium: 136 meq/L (ref 135–145)
Total Bilirubin: 0.7 mg/dL (ref 0.2–1.2)
Total Protein: 8 g/dL (ref 6.0–8.3)

## 2024-08-12 LAB — CBC WITH DIFFERENTIAL/PLATELET
Basophils Absolute: 0 K/uL (ref 0.0–0.1)
Basophils Relative: 0.3 % (ref 0.0–3.0)
Eosinophils Absolute: 0.3 K/uL (ref 0.0–0.7)
Eosinophils Relative: 1.9 % (ref 0.0–5.0)
HCT: 37.4 % (ref 36.0–46.0)
Hemoglobin: 12.5 g/dL (ref 12.0–15.0)
Lymphocytes Relative: 6 % — ABNORMAL LOW (ref 12.0–46.0)
Lymphs Abs: 0.9 K/uL (ref 0.7–4.0)
MCHC: 33.3 g/dL (ref 30.0–36.0)
MCV: 91 fl (ref 78.0–100.0)
Monocytes Absolute: 5.3 K/uL — ABNORMAL HIGH (ref 0.1–1.0)
Monocytes Relative: 36.7 % — ABNORMAL HIGH (ref 3.0–12.0)
Neutro Abs: 7.9 K/uL — ABNORMAL HIGH (ref 1.4–7.7)
Neutrophils Relative %: 55.1 % (ref 43.0–77.0)
Platelets: 80 K/uL — ABNORMAL LOW (ref 150.0–400.0)
RBC: 4.11 Mil/uL (ref 3.87–5.11)
RDW: 15.7 % — ABNORMAL HIGH (ref 11.5–15.5)
WBC: 14.4 K/uL — ABNORMAL HIGH (ref 4.0–10.5)

## 2024-08-12 LAB — POCT INFLUENZA A/B
Influenza A, POC: NEGATIVE
Influenza B, POC: NEGATIVE

## 2024-08-12 LAB — POC COVID19 BINAXNOW: SARS Coronavirus 2 Ag: NEGATIVE

## 2024-08-12 LAB — C-REACTIVE PROTEIN: CRP: 2 mg/dL (ref 0.5–20.0)

## 2024-08-12 MED ORDER — TRAMADOL HCL 50 MG PO TABS: 50.0000 mg | ORAL_TABLET | Freq: Three times a day (TID) | ORAL | 0 refills | Status: AC | PRN

## 2024-08-12 MED ORDER — AMOXICILLIN-POT CLAVULANATE ER 1000-62.5 MG PO TB12: 2.0000 | ORAL_TABLET | Freq: Two times a day (BID) | ORAL | 0 refills | Status: DC

## 2024-08-12 NOTE — Telephone Encounter (Signed)
 Spoke with Carcama with [facility] states she is sending pt [facility] .

## 2024-08-12 NOTE — Patient Instructions (Addendum)
 It was a pleasure seeing you today! Your health and satisfaction are our top priorities.  Morgan Cone, MD  VISIT SUMMARY: Today, you were seen for severe right-sided headache and ear pain, which you described as feeling like pressure and causing nausea. You also had a fever and difficulty breathing through your nose. You have a history of ear issues, including surgeries on your left ear. You took Tylenol  for relief and have tramadol  available at home.  YOUR PLAN: -ACUTE RIGHT OTITIS MEDIA WITH TYMPANIC MEMBRANE PERFORATION AND ACUTE RIGHT MAXILLARY SINUSITIS: You have a bacterial infection in your right ear and sinus, causing severe headache, fever, and ear pain. The infection is likely due to high sinus pressure and a previous ear surgery. You will start taking high-dose Augmentin  every 12 hours for 10 days. Avoid getting water in your ear and use earplugs in the shower. Do not use ear drops unless directed by an ENT specialist. Continue using saline nasal rinses. Tramadol  is prescribed for pain management as needed. Watch for serious symptoms like severe headache, neck stiffness, or stroke-like symptoms and seek emergency care if they occur.  -CARDIAC MURMUR: A cardiac murmur was detected, likely due to age-related changes in your aortic valve. There are no signs of a serious heart infection or acute heart issues, and it is not related to your current ear and sinus infection.  INSTRUCTIONS: Please follow up with an ENT specialist for evaluation and potential repair of your tympanic membrane. A note has been sent to Dr. Vaughan Ricker at [facility]. Continue taking Augmentin  as prescribed and use tramadol  for pain if needed. Avoid water exposure to your ear and use earplugs in the shower. Watch for any serious symptoms and seek emergency care if they occur.  Your Providers PCP: Jodie Lavern CROME, MD,  [phone]) Referring Provider: Jodie Lavern CROME, MD,  [phone]) Care Team Provider: Sheldon Standing,  MD,  [phone]) Care Team Provider: Abran Norleen SAILOR, MD,  [phone]) Care Team Provider: Amadeo Windell SAILOR, MD Care Team Provider: Ishmael Slough, MD,  [phone]) Care Team Provider: Melita Drivers, MD,  [phone]) Care Team Provider: Ricker Vaughan, MD,  [phone])  NEXT STEPS: [x]  Early Intervention: Schedule sooner appointment, call our on-call services, or go to emergency room if there is any significant Increase in pain or discomfort New or worsening symptoms Sudden or severe changes in your health [x]  Flexible Follow-Up: We recommend a Return in about 1 week (around 08/19/2024). for optimal routine care. This allows for progress monitoring and treatment adjustments. [x]  Preventive Care: Schedule your annual preventive care visit! It's typically covered by insurance and helps identify potential health issues early. [x]  Lab & X-ray Appointments: Incomplete tests scheduled today, or call to schedule. X-rays: [facility] (M-F, 8:30am-noon or 1pm-5pm). [x]  Medical Information Release: Sign a release form at front desk to obtain relevant medical information we don't have.  MAKING THE MOST OF OUR FOCUSED 20 MINUTE APPOINTMENTS: [x]   Clearly state your top concerns at the beginning of the visit to focus our discussion [x]   If you anticipate you will need more time, please inform the front desk during scheduling - we can book multiple appointments in the same week. [x]   If you have transportation problems- use our convenient video appointments or ask about transportation support. [x]   We can get down to business faster if you use MyChart to update information before the visit and submit non-urgent questions before your visit. Thank you for taking the time to provide details  through MyChart.  Let our nurse know and she can import this information into your encounter documents.  Arrival and Wait Times: [x]   Arriving on time ensures that everyone receives prompt  attention. [x]   Early morning (8a) and afternoon (1p) appointments tend to have shortest wait times. [x]   Unfortunately, we cannot delay appointments for late arrivals or hold slots during phone calls.  Getting Answers and Following Up [x]   Simple Questions & Concerns: For quick questions or basic follow-up after your visit, reach us  at [phone] or MyChart messaging. [x]   Complex Concerns: If your concern is more complex, scheduling an appointment might be best. Discuss this with the staff to find the most suitable option. [x]   Lab & Imaging Results: We'll contact you directly if results are abnormal or you don't use MyChart. Most normal results will be on MyChart within 2-3 business days, with a review message from Dr. Jesus. Haven't heard back in 2 weeks? Need results sooner? Contact us  at [phone]. [x]   Referrals: Our referral coordinator will manage specialist referrals. The specialist's office should contact you within 2 weeks to schedule an appointment. Call us  if you haven't heard from them after 2 weeks.  Staying Connected [x]   MyChart: Activate your MyChart for the fastest way to access results and message us . See the last page of this paperwork for instructions on how to activate.  Bring to Your Next Appointment [x]   Medications: Please bring all your medication bottles to your next appointment to ensure we have an accurate record of your prescriptions. [x]   Health Diaries: If you're monitoring any health conditions at home, keeping a diary of your readings can be very helpful for discussions at your next appointment.  Billing [x]   X-ray & Lab Orders: These are billed by separate companies. Contact the invoicing company directly for questions or concerns. [x]   Visit Charges: Discuss any billing inquiries with our administrative services team.  Your Satisfaction Matters [x]   Share Your Experience: We strive for your satisfaction! If you have any complaints, or preferably  compliments, please let Dr. Jesus know directly or contact our Practice Administrators, Manuelita Rubin or [employer], by asking at the front desk.   Reviewing Your Records [x]   Review this early draft of your clinical encounter notes below and the final encounter summary tomorrow on MyChart after its been completed.  All orders placed so far are visible here: Nasal congestion -     POC COVID-19 BinaxNow -     POCT Influenza A/B  Chronic otitis media of right ear with perforated tympanic membrane -     Amoxicillin -Pot Clavulanate ER; Take 2 tablets by mouth 2 (two) times daily.  Dispense: 20 tablet; Refill: 0 -     Culture, blood (single) w Reflex to ID Panel -     Culture, blood (single) w Reflex to ID Panel -     ECHOCARDIOGRAM COMPLETE; Future -     CBC with Differential/Platelet -     Comprehensive metabolic panel with GFR -     C-reactive protein  Newly recognized heart murmur -     traMADol  HCl; Take 1 tablet (50 mg total) by mouth every 8 (eight) hours as needed for up to 5 days.  Dispense: 15 tablet; Refill: 0 -     Culture, blood (single) w Reflex to ID Panel -     Culture, blood (single) w Reflex to ID Panel -     ECHOCARDIOGRAM COMPLETE; Future -  CBC with Differential/Platelet -     Comprehensive metabolic panel with GFR -     C-reactive protein  Fever in other diseases -     Culture, blood (single) w Reflex to ID Panel -     Culture, blood (single) w Reflex to ID Panel -     ECHOCARDIOGRAM COMPLETE; Future -     CBC with Differential/Platelet -     Comprehensive metabolic panel with GFR -     C-reactive protein         Ear Infection After-Visit Summary  Summary of Your Visit and Next Steps  You were seen today for fever, severe right-sided headache, and drainage from your right ear. Examination showed a 10-20% perforation (hole) in your right eardrum, with clear fluid and pus draining out, and signs of infection. You have a history of ear surgeries for  previous eardrum rupture.  Diagnosis  - Acute ear infection (otitis media) with a perforated eardrum and active drainage.  Treatment Plan  - Antibiotic therapy: You are prescribed amoxicillin -clavulanate (Augmentin ), which is recommended for ear infections with eardrum rupture and drainage. Take the medication exactly as directed for the full course, even if you start to feel better.[1][2]  - Pain control: Use acetaminophen  (Tylenol ) or ibuprofen (Advil) as needed for pain or fever.[3]  - Ear care:   - Keep your affected ear dry. Do not allow water to enter the ear canal during bathing or showering. You may use a cotton ball coated with petroleum jelly in the ear canal to help keep it dry.[4][5]  - Do not use cotton-tipped swabs or attempt to clean inside the ear canal.  - Avoid swimming until cleared by your ear specialist.  - Monitoring: Watch for any worsening symptoms, such as increasing pain, persistent fever, dizziness, new hearing loss, or any changes in your mental status. These may be signs of complications and should prompt immediate medical attention.[4][5]  - Follow-up: You are scheduled to see an ear, nose, and throat (ENT) specialist soon. If your symptoms do not improve within 48-72 hours, or if they worsen, contact your healthcare provider or seek urgent care.[4][5][1]  Additional Information  - Most small eardrum perforations heal on their own, but infection can delay healing. Antibiotics and keeping the ear dry help promote recovery.[4][6]  - Do not use any ear drops unless specifically prescribed by your doctor or ENT specialist, as some drops can be harmful if the eardrum is perforated.[7]  - An audiogram (hearing test) may be recommended by your ENT after healing to check for any changes in hearing.[5]  When to Seek Immediate Help  - Severe headache, neck stiffness, confusion, or weakness  - Sudden hearing loss or severe dizziness  - Persistent high fever or  worsening ear pain  Next Steps  - Take your antibiotics as prescribed.  - Keep your ear dry and avoid inserting anything into the ear canal.  - Attend your ENT appointment as scheduled.  - Contact your healthcare provider if you have any concerns or worsening symptoms.  If you have any questions about your care, please reach out to your healthcare team.  [4][5][1][7][6][2] References The Diagnosis and Management of Acute Otitis Media. Lieberthal AS, Dow AE, Chonmaitree T, et al. Pediatrics. 2013;131(3):e964-99. doi:10.1542/peds.7987-6511. Otitis Media in [facility]. Shaikh N. The [place] Journal of Medicine. 2025;392(14):1418-1426. doi:10.1056/[id number]. Primary Care Management of Otitis Media Among [place] Children. Reda VEAR Evangelina MICHAELYN, [place], [place] CB. The Medical Journal of [place]. 2009;191(S9):S55-9.  doi:10.5694/[id number].x. The Perforated Tympanic Membrane. Kristene RAINBOW, [place] MS. American Family Physician. 1992;45(4):1777-82. Traumatic Tympanic Membrane Perforations Diagnosed in Emergency Departments. Carniol ET, Bresler A, Celestino POUR, et al. JAMA Otolaryngology-- Head & Neck Surgery. 2018;144(2):136-139. doi:10.1001/jamaoto.2017.2550. Comparing Spontaneous Closure and Paper Patching in Traumatic Tympanic Membrane Perforations. Hanege FM, Karaca S, Kalcioglu MT, [place] M. The Journal of Craniofacial Surgery. 2018;29(7):1922-1924. doi:10.1097/SCS.[id number]. Clinical Practice Guideline: Acute Otitis Externa. Elwyn RM, Arlana SR, Cannon CR, et al. Otolaryngology--Head and Neck Surgery : Official Journal of American Academy of Otolaryngology-Head and Neck Surgery. 7985;849(8 Suppl):S1-S24. doi:10.1177/[id number].

## 2024-08-12 NOTE — Progress Notes (Signed)
 ==============================  [facility]: [phone]   -- Medical Office Visit --  Patient: Morgan [name]      Age: 73 y.o.       Sex:  female  Date:   08/12/2024 Today's Healthcare Provider: Bernardino KANDICE Cone, MD  ==============================   Chief Complaint: Nasal Congestion (Has been going on for two days now), Headache (Headache off and on with fever of 100.6 this morning ), and Ear Pain (Right ear pain for two days right side of ear pain and face hurting)   Discussed the use of AI scribe software for clinical note transcription with the patient, who gave verbal consent to proceed.  History of Present Illness Morgan [name] is a 73 year old female who presents with severe right-sided headache and ear pain.  She has experienced severe right-sided headache and ear pain, with the headache radiating into her face, ear, and neck. The sensation is described as pressure, as if her head is 'about to explode.' She also experiences nausea, which she attributes to the intensity of the headache. She awoke this morning with a fever of 100.43F.  She reports difficulty breathing through her nose, which feels blocked despite attempts to clear it with saline. Her mouth is extremely dry, indicating mouth breathing.  She has a history of ear issues, including two surgeries on her left ear, one in childhood and another about five years ago to replace the eardrum membrane. She mentions a persistent pinhole in the eardrum that did not heal completely. She recalls experiencing a sharp pain in her ear after eating ice cream on Sunday, which she suspects may have exacerbated her current symptoms.  She took Tylenol  at 8 AM, which provided some relief. She has tramadol  available at home, prescribed previously, but has not taken it yet.  Background Reviewed: Problem List: has Benign colon polyp; Mixed hyperlipidemia; Essential hypertension; Venous  (peripheral) insufficiency; Allergic rhinitis; Gastroesophageal reflux disease without esophagitis; Diverticulosis of colon; Fatty liver disease, nonalcoholic; Thrombocytopenia (HCC); Immunosuppressed status (HCC); Current chronic use of systemic steroids; Leukocytosis; Rheumatoid arthritis involving multiple sites with positive rheumatoid factor (HCC); Uses hearing aid; Obesity with body mass index 30 or greater; H/O partial resection of colon; Bilateral sensorineural hearing loss; Psoriasis; and Screening for osteoporosis on their problem list. Past Medical History:  has a past medical history of Abnormal chest x-ray, Abnormal electrocardiogram, Abscess of sigmoid colon due to diverticulitis (11/04/2017), Allergic rhinitis, Allergy, Cancer (HCC), Cataract, Colon polyp, Complication of anesthesia, Contact dermatitis due to poison ivy, Cystitis, Diverticulitis with abscess status post robotic low anterior rectosigmoid resection 12/11/2017 (12/11/2017), Diverticulosis of colon, Fatty liver disease, nonalcoholic, GERD (gastroesophageal reflux disease), Hearing loss, Hemorrhoids, Hyperlipidemia, Hypertension, Lymphocytosis, Overweight(278.02), Pneumonia, PONV (postoperative nausea and vomiting), Psoriasis (12/27/2020), RA (rheumatoid arthritis) (HCC), S/P reverse total shoulder arthroplasty, 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 Past Surgical History:   has a past surgical history that includes Total abdominal hysterectomy (11/1985); Inner ear surgery (Right); Nasal septum surgery; Foot surgery; chemical and laser endovenous ablation of LE veins (2008, 2009, 2010, 2011); Cesarean section; Proctoscopy (N/A, 12/11/2017); Laparoscopic lysis of adhesions (N/A, 12/11/2017); Tympanoplasty (Right, 11/03/2018); Colonoscopy; Polypectomy; Reverse shoulder arthroplasty (Right, 09/10/2019); Incision and drainage (Bilateral, 04/22/2020); Incision and drainage abscess (Left, 04/24/2020); Dressing change under anesthesia  (Right, 04/24/2020); endoscopy; I & D extremity (Left, 06/09/2020); Application of a-cell of extremity (Left, 06/09/2020); Application if wound vac (Left, 06/09/2020); Tonsillectomy; Skin split graft (Left, 07/26/2020); Application if wound vac (Left, 07/26/2020); Cataract  extraction w/ intraocular lens implant (Bilateral, 2022); Fracture surgery; Eye surgery; Cosmetic surgery; Colon surgery; and Joint replacement. Social History:   reports that she quit smoking about 22 years ago. Her smoking use included cigarettes. She started smoking about 37 years ago. She has a 7.5 pack-year smoking history. She has never used smokeless tobacco. She reports current alcohol  use. She reports that she does not use drugs. Family History:  family history includes Arthritis in her mother; Atrial fibrillation in her mother; Breast cancer in an other family member; COPD in her mother; Colon cancer in an other family member; Colon polyps in an other family member; Diabetes in her maternal aunt, maternal uncle, and sister; Healthy in her sister; Heart disease in her mother and sister; Heart failure in her brother and son; Hyperlipidemia in her mother; Hypertension in her mother; Liver disease in her sister. Allergies:  is allergic to remicade [infliximab], bee venom, poison ivy extract, adalimumab, golimumab , hydroxychloroquine , and secukinumab  (300 mg dose).   Medication Reconciliation: Current Outpatient Medications on File Prior to Visit  Medication Sig   acetaminophen  (TYLENOL ) 500 MG tablet Take 1,000 mg by mouth every 6 (six) hours as needed for moderate pain or headache.    ALPRAZolam  (XANAX ) 0.5 MG tablet Take 0.5-1 tablets (0.25-0.5 mg total) by mouth daily as needed for anxiety.   aspirin  EC 81 MG tablet Take 81 mg by mouth every Monday, Wednesday, and Friday. Swallow whole.   Calcium  Carbonate-Vitamin D  600-400 MG-UNIT tablet Take 1 tablet by mouth 2 (two) times daily.    certolizumab pegol (CIMZIA) 2 X 200 MG KIT  as directed Subcutaneous   Cholecalciferol  (VITAMIN D ) 50 MCG (2000 UT) CAPS Take 2,000 Units by mouth daily.    clobetasol (TEMOVATE) 0.05 % external solution 1 application Externally Twice a day for 14 days   fenofibrate  160 MG tablet TAKE 1 TABLET BY MOUTH EVERYDAY AT BEDTIME   fexofenadine  (ALLEGRA ) 180 MG tablet Take 1 tablet (180 mg total) by mouth daily.   fluticasone  (FLONASE ) 50 MCG/ACT nasal spray SPRAY 2 SPRAYS INTO EACH NOSTRIL DAILY AS NEEDED FOR ALLERGIES   folic acid (FOLVITE) 1 MG tablet TAKE 1 TABLET BY MOUTH EVERY DAY FOR 90 DAYS Oral for 90 Days   furosemide  (LASIX ) 20 MG tablet Take 1 tablet (20 mg total) by mouth daily as needed for edema. Take with potassium   ibuprofen (ADVIL) 200 MG tablet 1 tablet as needed Orally every 6 hrs (Patient not taking: Reported on 08/07/2024)   lisinopril  (ZESTRIL ) 40 MG tablet Take 1 tablet (40 mg total) by mouth daily.   methotrexate (RHEUMATREX) 2.5 MG tablet Take 15 mg by mouth once a week.   Multiple Vitamin (MULTIVITAMIN) tablet Take 1 tablet by mouth daily.   polyethylene glycol (MIRALAX ) 17 g packet Take 17 g by mouth daily.   potassium chloride  SA (KLOR-CON ) 20 MEQ tablet Take 1 tablet (20 mEq total) by mouth daily as needed (leg swelling). Take with furosemide    prednisoLONE acetate (PRED FORTE) 1 % ophthalmic suspension 1 drop 3 (three) times daily.   predniSONE  (DELTASONE ) 5 MG tablet Take 5 mg by mouth daily.   simvastatin  (ZOCOR ) 40 MG tablet Take 1 tablet (40 mg total) by mouth at bedtime.   valACYclovir  (VALTREX ) 500 MG tablet Take 500 mg by mouth 2 (two) times daily.   No current facility-administered medications on file prior to visit.  There are no discontinued medications.   Physical Exam:    08/12/2024  9:51 AM 08/07/2024    8:03 AM 05/21/2024    9:42 AM  Vitals with BMI  Height 5' 4 5' 4 5' 4  Weight 177 lbs 3 oz 177 lbs 179 lbs  BMI 30.4 30.37 30.71  Systolic 126    Diastolic 70    Pulse 85    Vital signs  reviewed.  Nursing notes reviewed. Weight trend reviewed. Physical Activity: Insufficiently Active (08/06/2024)   Exercise Vital Sign    Days of Exercise per Week: 3 days    Minutes of Exercise per Session: 40 min   General Appearance:  No acute distress appreciable, but mildly ill appearing..   Well-groomed, healthy-appearing female.  Well proportioned with no abnormal fat distribution.  Good muscle tone. Pulmonary:  Normal work of breathing at rest, no respiratory distress apparent. SpO2: 98 %  Musculoskeletal: All extremities are intact.  Neurological:  Awake, alert, oriented, and engaged.  No obvious focal neurological deficits or cognitive impairments.  Sensorium seems unclouded.   Speech is clear and coherent with logical content. Psychiatric:  Appropriate mood, pleasant and cooperative demeanor, thoughtful and engaged during the exam   Verbalized to patient: Physical Exam HEENT: Right eardrum perforated with purulent discharge. Left ear normal. Nasal passages without abnormalities. CHEST: Lungs clear to auscultation bilaterally. Normal work of breathing  CARDIOVASCULAR: Heart murmur present.  It is a pansystolic murmur she reports was not previously known EXTREMITIES: No edema in lower extremities.      08/07/2024    8:06 AM 04/22/2024    9:04 AM 03/25/2024   11:35 AM 09/25/2023   11:31 AM  PHQ 2/9 Scores  PHQ - 2 Score 0 0 0 0   Results for orders placed or performed in visit on 08/12/24  CBC with Differential/Platelet  Result Value Ref Range   WBC 14.4 (H) 4.0 - 10.5 K/uL   RBC 4.11 3.87 - 5.11 Mil/uL   Hemoglobin 12.5 12.0 - 15.0 g/dL   HCT 62.5 63.9 - 53.9 %   MCV 91.0 78.0 - 100.0 fl   MCHC 33.3 30.0 - 36.0 g/dL   RDW 84.2 (H) 88.4 - 84.4 %   Platelets 80.0 (L) 150.0 - 400.0 K/uL   Neutrophils Relative % 55.1 43.0 - 77.0 %   Lymphocytes Relative (L) 12.0 - 46.0 %    6.0 manual diff correlated and verified by mamual diff; seending to [facility]; Notified NIKKI/RNA @ [facility]    Monocytes Relative (H) 3.0 - 12.0 %    36.7 MAnual diff performed, abnormal cells noted ; sending to [facility] for path review   Eosinophils Relative 1.9 0.0 - 5.0 %   Basophils Relative 0.3 0.0 - 3.0 %   Neutro Abs 7.9 (H) 1.4 - 7.7 K/uL   Lymphs Abs 0.9 0.7 - 4.0 K/uL   Monocytes Absolute 5.3 (H) 0.1 - 1.0 K/uL   Eosinophils Absolute 0.3 0.0 - 0.7 K/uL   Basophils Absolute 0.0 0.0 - 0.1 K/uL  Comprehensive metabolic panel with GFR  Result Value Ref Range   Sodium 136 135 - 145 mEq/L   Potassium 4.1 3.5 - 5.1 mEq/L   Chloride 103 96 - 112 mEq/L   CO2 25 19 - 32 mEq/L   Glucose, Bld 101 (H) 70 - 99 mg/dL   BUN 18 6 - 23 mg/dL   Creatinine, Ser 9.15 0.40 - 1.20 mg/dL   Total Bilirubin 0.7 0.2 - 1.2 mg/dL   Alkaline Phosphatase 45 39 - 117 U/L   AST 37 0 -  37 U/L   ALT 20 0 - 35 U/L   Total Protein 8.0 6.0 - 8.3 g/dL   Albumin 4.3 3.5 - 5.2 g/dL   GFR 31.97 >39.99 mL/min   Calcium  10.6 (H) 8.4 - 10.5 mg/dL  C-reactive protein  Result Value Ref Range   CRP 2.0 0.5 - 20.0 mg/dL  POC RNCPI-80 BinaxNow  Result Value Ref Range   SARS Coronavirus 2 Ag Negative Negative  POCT Influenza A/B  Result Value Ref Range   Influenza A, POC Negative Negative   Influenza B, POC Negative Negative  }        ASSESSMENT & PLAN   Assessment & Plan Nasal congestion Chronic otitis media of right ear with perforated tympanic membrane Newly recognized heart murmur Fever in other diseases Acute right otitis media with tympanic membrane perforation and acute right maxillary sinusitis   Acute right otitis media with tympanic membrane perforation and acute right maxillary sinusitis is likely bacterial. She experiences severe right-sided headache, fever, and ear pain. The tympanic membrane perforation with purulent discharge indicates a bacterial infection, likely caused by high sinus pressure. A history of tympanic membrane surgery with a residual pinhole may have contributed to the infection. There is  a risk of the infection spreading to the brain if untreated, but no evidence of endocarditis or lung involvement. Augmentin  is prescribed based on current clinical guidelines. Start high-dose Augmentin  every 12 hours for 10 days. Refer to ENT for evaluation and potential tympanic membrane repair. Advise against water exposure to the ear and use earplugs in the shower. Avoid ear drops unless directed by ENT. Educate on signs of serious complications, such as severe headache, neck stiffness, or stroke-like symptoms, and seek emergency care if she occurs. Continue saline nasal rinses. Prescribe tramadol  for pain management as needed. Send a note to Dr. Vaughan Ricker at ENT.  Cardiac murmur   A newly identified cardiac murmur is likely due to age-related changes in the aortic valve. There are no signs of endocarditis or acute cardiac issues, and the murmur is not related to the current infection.   Overall my suspicion for endocarditis is very low but it is very difficult to ignore the new murmur in the setting of a severe bacterial ear infection with tympanic membrane rupture most likely has been there for a while before she realized that so we were getting blood cultures prior to starting antibiotics and an urgent echocardiogram to make extra sure there is no endocarditis she is also to follow-up with Dr. Jodie soon and return/go to ER if she feels she is worsening despite antibiotics in any way   ORDER ASSOCIATIONS  #   DIAGNOSIS / CONDITION ICD-10 ENCOUNTER ORDER     ICD-10-CM   1. Nasal congestion  R09.81 POC COVID-19 BinaxNow    POCT Influenza A/B    2. Chronic otitis media of right ear with perforated tympanic membrane  H66.91 amoxicillin -clavulanate (AUGMENTIN  XR) 1000-62.5 MG 12 hr tablet   H72.91 Blood culture (routine single)    Blood culture (routine single)    ECHOCARDIOGRAM COMPLETE    CBC with Differential/Platelet    Comprehensive metabolic panel with GFR    C-reactive protein    3.  Newly recognized heart murmur  R01.1 traMADol  (ULTRAM ) 50 MG tablet    Blood culture (routine single)    Blood culture (routine single)    ECHOCARDIOGRAM COMPLETE    CBC with Differential/Platelet    Comprehensive metabolic panel with GFR    C-reactive  protein    4. Fever in other diseases  R50.81 Blood culture (routine single)    Blood culture (routine single)    ECHOCARDIOGRAM COMPLETE    CBC with Differential/Platelet    Comprehensive metabolic panel with GFR    C-reactive protein           Orders Placed in Encounter:  Lab Orders         Blood culture (routine single)         Blood culture (routine single)         CBC with Differential/Platelet         Comprehensive metabolic panel with GFR         C-reactive protein         POC COVID-19 BinaxNow         POCT Influenza A/B    Imaging Orders  No imaging studies ordered today   Referral Orders  No referral(s) requested today   Meds ordered this encounter  Medications   traMADol  (ULTRAM ) 50 MG tablet    Sig: Take 1 tablet (50 mg total) by mouth every 8 (eight) hours as needed for up to 5 days.    Dispense:  15 tablet    Refill:  0   amoxicillin -clavulanate (AUGMENTIN  XR) 1000-62.5 MG 12 hr tablet    Sig: Take 2 tablets by mouth 2 (two) times daily.    Dispense:  20 tablet    Refill:  0    Orders Placed This Encounter  Procedures   Blood culture (routine single)   Blood culture (routine single)   CBC with Differential/Platelet   Comprehensive metabolic panel with GFR   C-reactive protein   POC COVID-19 BinaxNow   POCT Influenza A/B   ECHOCARDIOGRAM COMPLETE    Stat Echo Comments; supported by OpenEvidence  Indication:  New pansystolic murmur in a febrile patient with recent tympanic membrane perforation and active otorrhea, history of prior ear surgeries, and current treatment with amoxicillin -clavulanate for presumed acute otitis media.  Clinical concern:  The combination of new murmur, fever, and recent  ear infection raises concern for possible infective endocarditis (IE), as supported by the American Heart Association and American College of Cardiology guidelines.[1][2] The modified Duke criteria highlight new valvular regurgitation and systemic infection as major and minor criteria for IE diagnosis.[2][3] Echocardiography is central to the evaluation of IE, with transthoracic echocardiography (TTE) recommended as the initial modality, and transesophageal echocardiography (TEE) indicated if TTE is negative or nondiagnostic and clinical suspicion remains.[1][3][4][5][6]  Specific request:  Please assess for valvular vegetations, regurgitation, annular abscess, leaflet perforation, and other intracardiac complications. If TTE is suboptimal or nondiagnostic, expedited TEE is warranted per guideline recommendations.[1][4][5][6][3]  Rationale:  Early echocardiographic diagnosis is critical for management decisions, including antimicrobial therapy and surgical planning, and is indicated in all patients with new murmurs and systemic infection.[1][2][3][4][5][6] The patient's history of prior ear surgeries and current infection increases risk for hematogenous spread and endovascular involvement.  Additional notes:  Blood cultures have been obtained prior to antibiotics per guideline recommendations.[2] Please notify the ordering provider urgently of any findings suggestive of endocarditis or its complications. References 1. Infective Endocarditis in Adults: Diagnosis, Antimicrobial Therapy, and Management of Complications: A Academic librarian for Healthcare Professionals From the American Heart Association. Baddour LM, Wilson WR, Bayer AS, et al. Circulation. 2015;132(15):1435-86. doi:10.1161/CIR.0000000000000296. 2. 2020 ACC/AHA Guideline for the Management of Patients With Valvular Heart Disease: A Report of the [employer] of Cardiology/American Heart Association Joint Committee on Clinical  Practice Guidelines. Ritta CM, Nishimura RA, Bonow RO, et al. Journal of the [employer] of Cardiology. 2021;77(4):e25-e197. doi:10.1016/j.jacc.2020.11.018. 3. Native-Valve Infective Endocarditis. Chambers HF, Bayer AS. The [place] Journal of Medicine. 2020;383(6):567-576. doi:10.1056/NEJMcp2000400. 4. Guidelines for Diagnosis and Management of Infective Endocarditis in Adults: A WikiGuidelines Group Consensus Statement. McDonald EG, Aggrey G, Aslan AT, et al. JAMA Network Open. 2023;6(7):e2326366. doi:10.1001/jamanetworkopen.7976.73633. 5. 2014 AHA/ACC Guideline for the Management of Patients With Valvular Heart Disease: A Report of the [employer] of Cardiology/American Heart Association Task Force on Practice Guidelines. Lavelle OPPENHEIM, Otto CM, Bonow RO, et al. Journal of the [employer] of Cardiology. 2014;63(22):e57-185. doi:10.1016/j.jacc.2014.02.536. 6. Management Considerations in Infective Endocarditis: A Review. Wang A, Gaca JG, Chu VH. JAMA. 2018;320(1):72-83. doi:10.1001/jama.7981.2403.    Standing Status:   Future    Expiration Date:   08/12/2025    Where should this test be performed:   [facility]    Perflutren DEFINITY (image enhancing agent) should be administered unless hypersensitivity or allergy exist:   Do NOT administer Perflutren    Reason for no Perflutren:   Other    Specify other:   rule out endocarditis.    Reason for exam-Echo:   Endocarditis I38   ED Discharge Orders          Ordered    POC COVID-19 BinaxNow        08/12/24 1002    POCT Influenza A/B        08/12/24 1002    traMADol  (ULTRAM ) 50 MG tablet  Every 8 hours PRN        08/12/24 1030    amoxicillin -clavulanate (AUGMENTIN  XR) 1000-62.5 MG 12 hr tablet  2 times daily        08/12/24 1030    Blood culture (routine single)        08/12/24 1030    Blood culture (routine single)        08/12/24 1030    ECHOCARDIOGRAM COMPLETE       Comments: Specimen [id number]: Stat Echo  Comments; supported by OpenEvidenceIndication:New pansystolic murmur in a febrile patient with recent tympanic membrane perforation and active otorrhea, history of prior ear surgeries, and current treatment with amoxicillin -clavulanate for presumed acute otitis media.Clinical concern:The combination of new murmur, fever, and recent ear infection raises concern for possible infective endocarditis (IE), as supported by the American Heart Association and American College of Cardiology guidelines.[1][2] The modified Duke criteria highlight new valvular regurgitation and systemic infection as major and minor criteria for IE diagnosis.[2][3] Echocardiography is central to the evaluation of IE, with transthoracic echocardiography (TTE) recommended as the initial modality, and transesophageal echocardiography (TEE) indicated if TTE is negative or nondiagnostic and clinical suspicion remains.[1][3][4][5][6]Specific request:Please assess for valvular vegetations, regurgitation, annular abscess, leaflet perforation, and other intracardiac complications. If TTE is suboptimal or nondiagnostic, expedited TEE is warranted per guideline recommendations.[1][4][5][6][3]Rationale:Early echocardiographic diagnosis is critical for management decisions, including antimicrobial therapy and surgical planning, and is indicated in all patients with new murmurs and systemic infection.[1][2][3][4][5][6] The patient's history of prior ear surgeries and current infection increases risk for hematogenous spread and endovascular involvement.Additional notes:Blood cultures have been obtained prior to antibiotics per guideline recommendations.[2] Please notify the ordering provider urgently of any findings suggestive of endocarditis or its complications.References1. Infective Endocarditis in Adults: Diagnosis, Antimicrobial Therapy, and Management of Complications: A Academic librarian for Healthcare Professionals From the American Heart  Association. Baddour LM, Wilson WR, Bayer AS, et al. Circulation. 2015;132(15):1435-86. doi:10.1161/CIR.0000000000000296.2. 2020 ACC/AHA Guideline for the Management of Patients With  Valvular Heart Disease: A Report of the [employer] of Cardiology/American Heart Association Joint Committee on Clinical Practice Guidelines. Ritta CM, Nishimura RA, Bonow RO, et al. Journal of the [employer] of Cardiology. 2021;77(4):e25-e197. doi:10.1016/j.jacc.2020.11.018.3. Native-Valve Infective Endocarditis. Chambers HF, Bayer AS. The [place] Journal of Medicine. 2020;383(6):567-576. doi:10.1056/NEJMcp2000400.4. Guidelines for Diagnosis and Management of Infective Endocarditis in Adults: A WikiGuidelines Group Consensus Statement. McDonald EG, Aggrey G, Aslan AT, et al. JAMA Network Open. 2023;6(7):e2326366. doi:10.1001/jamanetworkopen.7976.73633.4. 2014 AHA/ACC Guideline for the Management of Patients With Valvular Heart Disease: A Report of the [employer] Cardiology/American Heart Association Task Force on Practice Guidelines. Lavelle OPPENHEIM, Otto CM, Bonow RO, et al. Journal of the [employer] of Cardiology. 2014;63(22):e57-185. doi:10.1016/j.jacc.2014.02.536.6. Management Considerations in Infective Endocarditis: A Review. Wang A, Gaca JG, Chu VH. JAMA. 2018;320(1):72-83. doi:10.1001/jama.7981.2403.    08/12/24 1030    CBC with Differential/Platelet        08/12/24 1031    Comprehensive metabolic panel with GFR        08/12/24 1031    C-reactive protein        08/12/24 1031              This document was synthesized by artificial intelligence ([employer]bridge) using HIPAA-compliant recording of the clinical interaction;   We discussed the use of AI scribe software for clinical note transcription with the patient, who gave verbal consent to proceed. additional Info: This encounter employed state-of-the-art, real-time, collaborative documentation. The patient actively reviewed and assisted in  updating their electronic medical record on a shared screen, ensuring transparency and facilitating joint problem-solving for the problem list, overview, and plan. This approach promotes accurate, informed care. The treatment plan was discussed and reviewed in detail, including medication safety, potential side effects, and all patient questions. We confirmed understanding and comfort with the plan. Follow-up instructions were established, including contacting the office for any concerns, returning if symptoms worsen, persist, or new symptoms develop, and precautions for potential emergency department visits.

## 2024-08-13 ENCOUNTER — Other Ambulatory Visit: Payer: Self-pay

## 2024-08-13 ENCOUNTER — Telehealth: Payer: Self-pay

## 2024-08-13 ENCOUNTER — Encounter: Admitting: Physical Therapy

## 2024-08-13 DIAGNOSIS — H9201 Otalgia, right ear: Secondary | ICD-10-CM | POA: Insufficient documentation

## 2024-08-13 DIAGNOSIS — J324 Chronic pansinusitis: Secondary | ICD-10-CM

## 2024-08-13 DIAGNOSIS — R070 Pain in throat: Secondary | ICD-10-CM | POA: Insufficient documentation

## 2024-08-13 LAB — PERIPHERAL BLOOD SMEAR REVIEW

## 2024-08-13 MED ORDER — AMOXICILLIN-POT CLAVULANATE 875-125 MG PO TABS: 1.0000 | ORAL_TABLET | Freq: Two times a day (BID) | ORAL | 0 refills | Status: AC

## 2024-08-13 NOTE — Telephone Encounter (Signed)
 Copied from CRM [id number]. Topic: Clinical - Medication Question >> Aug 12, 2024  2:59 PM Henretta I wrote: Reason for CRM: Patient was sent in an antibiotic that she was told to take for 10 days but when she picked it up it states 2 pills twice a day for 5 days. The medication is amoxicillin -clavulanate (AUGMENTIN  XR) 1000-62.5 MG 12 hr tablet, please advise patient on the correct way to take.

## 2024-08-13 NOTE — Telephone Encounter (Signed)
 Copied from CRM [id number]. Topic: Clinical - Medication Question >> Aug 12, 2024  5:03 PM Dedra B wrote: Reason for CRM: Pt needs clarification on the amoxicillin  prescription. Pt was told in clinic that the prescription would be for 1 tablet BID for 10 days, but the pharmacy instructions she received said 2 tablets BID for 5 days. She took 1 pill when she got home. Pls contact pt to clarify.  Message sent to pt thru MyChart

## 2024-08-14 ENCOUNTER — Ambulatory Visit (HOSPITAL_COMMUNITY)
Admission: RE | Admit: 2024-08-14 | Discharge: 2024-08-14 | Disposition: A | Discharge status: Home / Self Care | Source: Ambulatory Visit | Attending: Internal Medicine | Admitting: Internal Medicine

## 2024-08-14 DIAGNOSIS — R011 Cardiac murmur, unspecified: Secondary | ICD-10-CM | POA: Diagnosis not present

## 2024-08-14 DIAGNOSIS — I10 Essential (primary) hypertension: Secondary | ICD-10-CM | POA: Diagnosis not present

## 2024-08-14 DIAGNOSIS — I080 Rheumatic disorders of both mitral and aortic valves: Secondary | ICD-10-CM | POA: Insufficient documentation

## 2024-08-14 DIAGNOSIS — R5081 Fever presenting with conditions classified elsewhere: Secondary | ICD-10-CM | POA: Insufficient documentation

## 2024-08-14 DIAGNOSIS — E785 Hyperlipidemia, unspecified: Secondary | ICD-10-CM | POA: Insufficient documentation

## 2024-08-14 DIAGNOSIS — H7291 Unspecified perforation of tympanic membrane, right ear: Secondary | ICD-10-CM | POA: Diagnosis not present

## 2024-08-14 DIAGNOSIS — I38 Endocarditis, valve unspecified: Secondary | ICD-10-CM | POA: Diagnosis not present

## 2024-08-14 DIAGNOSIS — H6691 Otitis media, unspecified, right ear: Secondary | ICD-10-CM | POA: Diagnosis not present

## 2024-08-14 LAB — ECHOCARDIOGRAM COMPLETE
AR max vel: 1.15 cm2
AV Area VTI: 1.11 cm2
AV Area mean vel: 1.18 cm2
AV Mean grad: 10.5 mmHg
AV Peak grad: 20.1 mmHg
Ao pk vel: 2.24 m/s
Area-P 1/2: 3.05 cm2
Calc EF: 59.3 %
S' Lateral: 3 cm
Single Plane A2C EF: 56.5 %
Single Plane A4C EF: 60 %

## 2024-08-15 ENCOUNTER — Ambulatory Visit: Payer: Self-pay | Admitting: Internal Medicine

## 2024-08-16 ENCOUNTER — Ambulatory Visit: Payer: Self-pay | Admitting: Internal Medicine

## 2024-08-16 DIAGNOSIS — I350 Nonrheumatic aortic (valve) stenosis: Secondary | ICD-10-CM | POA: Insufficient documentation

## 2024-08-16 NOTE — Progress Notes (Signed)
 I previously didn't mention a couple of additional echocardiogram incidental findings I wanted to bring to your attention to follow up:  ECHOCARDIOGRAM COMPLETE (08/14/2024): Normal LV function (EF 55-60%), Grade I diastolic dysfunction, mild-moderate aortic stenosis with moderate calcification, and no definitive evidence of endocarditis/vegetation.

## 2024-08-16 NOTE — Progress Notes (Signed)
 Patient evaluated for severe sinus infection, new murmur heard so we ruled out endocarditis; found moderate aortic stenosis, added to problem list.

## 2024-08-17 NOTE — Telephone Encounter (Signed)
 read by Montie SHAUNNA Joshua Dorthea at 1:38PM on 08/16/2024.

## 2024-08-18 LAB — CULTURE, BLOOD (SINGLE)
MICRO NUMBER:: 16949740
MICRO NUMBER:: 16949750
Result:: NO GROWTH
Result:: NO GROWTH
SPECIMEN QUALITY:: ADEQUATE
SPECIMEN QUALITY:: ADEQUATE

## 2024-08-19 ENCOUNTER — Ambulatory Visit: Admitting: Family Medicine

## 2024-08-20 ENCOUNTER — Ambulatory Visit (INDEPENDENT_AMBULATORY_CARE_PROVIDER_SITE_OTHER): Admitting: Podiatry

## 2024-08-20 VITALS — Ht 64.0 in | Wt 177.2 lb

## 2024-08-20 DIAGNOSIS — B351 Tinea unguium: Secondary | ICD-10-CM | POA: Diagnosis not present

## 2024-08-20 DIAGNOSIS — M79674 Pain in right toe(s): Secondary | ICD-10-CM | POA: Diagnosis not present

## 2024-08-20 DIAGNOSIS — M79675 Pain in left toe(s): Secondary | ICD-10-CM

## 2024-08-20 MED ORDER — TERBINAFINE HCL 250 MG PO TABS: 250.0000 mg | ORAL_TABLET | Freq: Every day | ORAL | 0 refills | Status: AC

## 2024-08-20 NOTE — Progress Notes (Signed)
  Subjective:  Patient ID: Morgan [name], female    DOB: May 01, 1951,  MRN: [id number]  Chief Complaint  Patient presents with   Nail Problem    RM 23 RFC/Nail Trim.    73 y.o. female presents with the above complaint. History confirmed with patient.  Notes some loosening and thickening that seems to be worsening again  Objective:  Physical Exam: warm, good capillary refill, no trophic changes or ulcerative lesions, normal DP and PT pulses, and normal sensory exam.  Some increase in the dystrophy and thickening    Assessment:   1. Pain due to onychomycosis of toenails of both feet      Plan:  Patient was evaluated and treated and all questions answered.   Discussed the etiology and treatment options for the condition in detail with the patient.   Recommended debridement of the nails today. Sharp and mechanical debridement performed of all painful and mycotic nails today. Nails debrided in length and thickness using a nail nipper to level of comfort.  Seems to have some recurrence of onychomycosis.  We discussed using fluconazole or itraconazole pulsed dosing but this likely will have interactions with her Zocor , I prescribed her Lamisil  therapy for another 90 days and I will see her in 3 months to reevaluate     Return in about 3 months (around 11/19/2024) for painful thick fungal nails, follow up after nail fungus treatment.

## 2024-08-24 ENCOUNTER — Telehealth (INDEPENDENT_AMBULATORY_CARE_PROVIDER_SITE_OTHER): Admitting: Family Medicine

## 2024-08-24 ENCOUNTER — Encounter: Payer: Self-pay | Admitting: Family Medicine

## 2024-08-24 VITALS — Ht 64.0 in | Wt 177.0 lb

## 2024-08-24 DIAGNOSIS — I350 Nonrheumatic aortic (valve) stenosis: Secondary | ICD-10-CM

## 2024-08-24 DIAGNOSIS — F4322 Adjustment disorder with anxiety: Secondary | ICD-10-CM | POA: Diagnosis not present

## 2024-08-24 DIAGNOSIS — H7290 Unspecified perforation of tympanic membrane, unspecified ear: Secondary | ICD-10-CM

## 2024-08-25 NOTE — Progress Notes (Signed)
 Subjective  CC:  Chief Complaint  Patient presents with   Follow-up    HPI: Morgan [name] is a 73 y.o. female who presents to the office today to address the problems listed above in the chief complaint. Discussed the use of AI scribe software for clinical note transcription with the patient, who gave verbal consent to proceed. Virtual Visit via Video Note I connected with Morgan [name] on 08/25/24 at  3:30 PM EDT by a video enabled telemedicine application and verified that I am speaking with the correct person using two identifiers. Location patient: Home Location provider: [facility] Persons participating in the virtual visit: Morgan [name] Lavern LITTIE Jodie, MD Avelina Berber, CMA  I discussed the limitations of evaluation and management by telemedicine and the availability of in person appointments. The patient expressed understanding and agreed to proceed.  History of Present Illness Morgan [name] is a 74 year old female who presents with a recent episode of fever and severe headache.  She experienced a fever of 100.42F and a severe headache localized to the right side of her head, extending into her neck and shoulder. No cough or significant nasal congestion was noted, except for slight stuffiness.  I reviewed notes from the recent acute care visit.  Unfortunately, patient did not feel that she had a very good experience with that visit.  She reports she had fever and right-sided headache but no other symptoms.  She was treated for a possible sinus infection with Augmentin .  However her fever and symptoms resolved within 24 hours.  Her lab work showed elevated white blood count but this is chronic and reactive for her.  On exam patient was found to have a perforation which also is chronic.  She was told she had a severe ear infection and was referred to ENT.  ENT notes were reviewed.  Chronic uninfected ear was noted on exam.  Also patient  was sent for echocardiogram due to a murmur.  Mild to moderate aortic stenosis was present but no other abnormalities were found.  Patient is quite upset given that she had an unclear medical advice.  At any rate, she is now feeling back to her normal self without any problems.  Follow-up stress reaction: See my last note.  Fortunately she is coping well.  Her husband is currently hospitalized due to a pneumothorax.  He has a chest tube in place.  They continue to monitor for possible recurrence of lung cancer.  However both are coping well.  She only needed a half a Xanax  twice over the last several weeks and it did work well.  She no longer is concerned about her stress levels.  She feels she is doing well.    Assessment  1. Transient adjustment reaction with anxiety   2. Aortic valve stenosis, etiology of cardiac valve disease unspecified   3. Perforation of tympanic membrane, unspecified laterality      Plan  Assessment and Plan Assessment & Plan Transient anxiety: Fortunately coping well.  Continue to counsel and support as needed.  No further medications needed at this time.  Chronic perforation of tympanic membrane Chronic tympanic membrane perforation without acute infection or drainage.  Chronic leukocytosis Stable chronic reactive leukocytosis with WBC count of 14,000, no acute infection. - Continue routine monitoring of WBC count.  Mild to moderate aortic stenosis without symptoms.  Will monitor.    Follow up: As needed No orders of the  defined types were placed in this encounter.  No orders of the defined types were placed in this encounter.    I reviewed the patients updated PMH, FH, and SocHx.  Patient Active Problem List   Diagnosis Date Noted   Psoriasis 12/27/2020    Priority: High   Rheumatoid arthritis involving multiple sites with positive rheumatoid factor (HCC) 10/31/2017    Priority: High   Immunosuppressed status 10/08/2017    Priority: High    Obesity with body mass index 30 or greater 01/25/2017    Priority: High   Current chronic use of systemic steroids 06/08/2016    Priority: High   Essential hypertension 10/20/2008    Priority: High   Mixed hyperlipidemia 04/23/2008    Priority: High   Aortic stenosis 08/16/2024    Priority: Medium    H/O partial resection of colon 01/31/2018    Priority: Medium    Leukocytosis 08/27/2014    Priority: Medium    Fatty liver disease, nonalcoholic 11/24/2010    Priority: Medium    Benign colon polyp 10/20/2008    Priority: Medium    Diverticulosis of colon 10/20/2008    Priority: Medium    Venous (peripheral) insufficiency 04/23/2008    Priority: Medium    Gastroesophageal reflux disease without esophagitis 04/23/2008    Priority: Medium    Bilateral sensorineural hearing loss 02/02/2019    Priority: Low   Uses hearing aid 12/09/2015    Priority: Low   Thrombocytopenia 11/23/2013    Priority: Low   Allergic rhinitis 04/23/2008    Priority: Low   Tympanic membrane perforation 08/24/2024   Screening for osteoporosis 10/09/2021   Current Meds  Medication Sig   acetaminophen  (TYLENOL ) 500 MG tablet Take 1,000 mg by mouth every 6 (six) hours as needed for moderate pain or headache.    ALPRAZolam  (XANAX ) 0.5 MG tablet Take 0.5-1 tablets (0.25-0.5 mg total) by mouth daily as needed for anxiety.   aspirin  EC 81 MG tablet Take 81 mg by mouth every Monday, Wednesday, and Friday. Swallow whole.   Calcium  Carbonate-Vitamin D  600-400 MG-UNIT tablet Take 1 tablet by mouth 2 (two) times daily.    certolizumab pegol (CIMZIA) 2 X 200 MG KIT as directed Subcutaneous   Cholecalciferol  (VITAMIN D ) 50 MCG (2000 UT) CAPS Take 2,000 Units by mouth daily.    clobetasol (TEMOVATE) 0.05 % external solution 1 application Externally Twice a day for 14 days   fenofibrate  160 MG tablet TAKE 1 TABLET BY MOUTH EVERYDAY AT BEDTIME   fexofenadine  (ALLEGRA ) 180 MG tablet Take 1 tablet (180 mg total) by mouth  daily.   fluticasone  (FLONASE ) 50 MCG/ACT nasal spray SPRAY 2 SPRAYS INTO EACH NOSTRIL DAILY AS NEEDED FOR ALLERGIES   folic acid (FOLVITE) 1 MG tablet TAKE 1 TABLET BY MOUTH EVERY DAY FOR 90 DAYS Oral for 90 Days   furosemide  (LASIX ) 20 MG tablet Take 1 tablet (20 mg total) by mouth daily as needed for edema. Take with potassium   ibuprofen (ADVIL) 200 MG tablet 1 tablet as needed Orally every 6 hrs   lisinopril  (ZESTRIL ) 40 MG tablet Take 1 tablet (40 mg total) by mouth daily.   methotrexate (RHEUMATREX) 2.5 MG tablet Take 15 mg by mouth once a week.   Multiple Vitamin (MULTIVITAMIN) tablet Take 1 tablet by mouth daily.   polyethylene glycol (MIRALAX ) 17 g packet Take 17 g by mouth daily.   potassium chloride  SA (KLOR-CON ) 20 MEQ tablet Take 1 tablet (20 mEq total) by mouth  daily as needed (leg swelling). Take with furosemide    predniSONE  (DELTASONE ) 5 MG tablet Take 5 mg by mouth daily.   simvastatin  (ZOCOR ) 40 MG tablet Take 1 tablet (40 mg total) by mouth at bedtime.   terbinafine  (LAMISIL ) 250 MG tablet Take 1 tablet (250 mg total) by mouth daily.   valACYclovir  (VALTREX ) 500 MG tablet Take 500 mg by mouth 2 (two) times daily.   Allergies: Patient is allergic to remicade [infliximab], bee venom, poison ivy extract, adalimumab, golimumab , hydroxychloroquine , and secukinumab  (300 mg dose). Family History: Patient family history includes Arthritis in her mother; Atrial fibrillation in her mother; Breast cancer in an other family member; COPD in her mother; Colon cancer in an other family member; Colon polyps in an other family member; Diabetes in her maternal aunt, maternal uncle, and sister; Healthy in her sister; Heart disease in her mother and sister; Heart failure in her brother and son; Hyperlipidemia in her mother; Hypertension in her mother; Liver disease in her sister. Social History:  Patient  reports that she quit smoking about 22 years ago. Her smoking use included cigarettes. She  started smoking about 37 years ago. She has a 7.5 pack-year smoking history. She has never used smokeless tobacco. She reports current alcohol  use. She reports that she does not use drugs.  Review of Systems: Constitutional: Negative for fever malaise or anorexia Cardiovascular: negative for chest pain Respiratory: negative for SOB or persistent cough Gastrointestinal: negative for abdominal pain  Objective  Vitals: Ht 5' 4 (1.626 m)   Wt 177 lb (80.3 kg)   LMP  (LMP Unknown)   BMI 30.38 kg/m  General: no acute distress , A&Ox3 HEENT: PEERL, conjunctiva normal, neck is supple  Commons side effects, risks, benefits, and alternatives for medications and treatment plan prescribed today were discussed, and the patient expressed understanding of the given instructions. Patient is instructed to call or message via MyChart if he/she has any questions or concerns regarding our treatment plan. No barriers to understanding were identified. We discussed Red Flag symptoms and signs in detail. Patient expressed understanding regarding what to do in case of urgent or emergency type symptoms.  Medication list was reconciled, printed and provided to the patient in AVS. Patient instructions and summary information was reviewed with the patient as documented in the AVS. This note was prepared with assistance of Dragon voice recognition software. Occasional wrong-word or sound-a-like substitutions may have occurred due to the inherent limitations of voice recognition software

## 2024-09-08 ENCOUNTER — Other Ambulatory Visit (HOSPITAL_BASED_OUTPATIENT_CLINIC_OR_DEPARTMENT_OTHER): Payer: Self-pay

## 2024-09-08 MED ORDER — COMIRNATY 30 MCG/0.3ML IM SUSY
0.3000 mL | PREFILLED_SYRINGE | Freq: Once | INTRAMUSCULAR | 0 refills | Status: AC
  Filled 2024-09-08: qty 0.3, 1d supply, fill #0

## 2024-09-08 MED ORDER — FLUZONE HIGH-DOSE 0.5 ML IM SUSY
0.5000 mL | PREFILLED_SYRINGE | Freq: Once | INTRAMUSCULAR | 0 refills | Status: AC
Start: 2024-09-08 — End: 2024-09-09
  Filled 2024-09-08: qty 0.5, 1d supply, fill #0

## 2024-09-12 ENCOUNTER — Other Ambulatory Visit: Payer: Self-pay | Admitting: Family Medicine

## 2024-09-28 ENCOUNTER — Ambulatory Visit: Admitting: Family Medicine

## 2024-09-28 VITALS — BP 138/70 | HR 76 | Temp 97.7°F | Ht 64.0 in | Wt 177.8 lb

## 2024-09-28 DIAGNOSIS — G44229 Chronic tension-type headache, not intractable: Secondary | ICD-10-CM | POA: Diagnosis not present

## 2024-09-28 DIAGNOSIS — R1314 Dysphagia, pharyngoesophageal phase: Secondary | ICD-10-CM

## 2024-09-28 DIAGNOSIS — I10 Essential (primary) hypertension: Secondary | ICD-10-CM

## 2024-09-28 DIAGNOSIS — F4322 Adjustment disorder with anxiety: Secondary | ICD-10-CM

## 2024-09-28 DIAGNOSIS — M0579 Rheumatoid arthritis with rheumatoid factor of multiple sites without organ or systems involvement: Secondary | ICD-10-CM | POA: Diagnosis not present

## 2024-09-28 DIAGNOSIS — K219 Gastro-esophageal reflux disease without esophagitis: Secondary | ICD-10-CM

## 2024-09-28 MED ORDER — PANTOPRAZOLE SODIUM 20 MG PO TBEC: 20.0000 mg | DELAYED_RELEASE_TABLET | Freq: Every day | ORAL | 0 refills | Status: DC

## 2024-09-28 MED ORDER — CYCLOBENZAPRINE HCL 5 MG PO TABS: 5.0000 mg | ORAL_TABLET | Freq: Three times a day (TID) | ORAL | 1 refills | Status: AC | PRN

## 2024-09-28 NOTE — Progress Notes (Signed)
 Subjective  CC:  Chief Complaint  Patient presents with   Hypertension   Hyperlipidemia    HPI: Morgan [name] is a 73 y.o. female who presents to the office today to address the problems listed above in the chief complaint. Hypertension f/u:  Discussed the use of AI scribe software for clinical note transcription with the patient, who gave verbal consent to proceed.  History of Present Illness Morgan [name] is a 73 year old female who presents with persistent right-sided headaches and swallowing difficulties.  Cephalalgia and cervicalgia - Persistent right-sided headaches for several months, intermittent, happening about once or twice a week. - Pain radiates into ear and neck - Headaches occur a couple of times per week, lasting up to half a day - Severity is usually mild to moderate, with one severe episode - Avoids medication for headaches - No associated neurologic symptoms or diplopia - Occasional neck pain and stiffness - Started with onset of increased stressors due to husband's illness.  Fortunately that has improved.  Dysphagia - Difficulty swallowing with sensation of food getting stuck in throat approximately once per week - Occurs with solids, liquids, and sometimes air - No odynophagia - Frequent coughing as if a piece of food is stuck - No prior esophageal studies  Gastroesophageal reflux symptoms - Intermittent reflux symptoms - No history of stomach pain - No prior use of heartburn medication - Not currently on medication for reflux  Allergic rhinitis - History of weather-related allergies - Uses Allegra  as needed - Experiences some drainage - No significant symptoms  Sleep quality - Sleeps well at night - No difficulty falling asleep   Assessment  1. Essential hypertension   2. Rheumatoid arthritis involving multiple sites with positive rheumatoid factor (HCC)   3. Transient adjustment reaction with anxiety   4. Chronic tension-type  headache, not intractable   5. Pharyngoesophageal dysphagia   6. Gastroesophageal reflux disease, unspecified whether esophagitis present      Plan  Assessment and Plan Assessment & Plan Tension-type headaches Chronic tension-type headaches, primarily on the right side, with pain radiating to the ear and neck. Occurs a couple of times a week, with varying severity from mild to severe. Likely musculoskeletal in nature, possibly related to stress and tension. No neurological symptoms reported. Persistent but not worsening. - Prescribe a low-dose muscle relaxer to be used at night for 2-3 nights to alleviate muscle tension. - Follow-up with me if headaches change, worsen or do not improve.  Dysphagia and possible oropharyngeal dysfunction Intermittent dysphagia with solids, liquids, and air, occurring approximately once a week. No pain on swallowing. Possible esophageal stricture or other structural issue. - Refer to GI specialist, Dr. Abran, for evaluation and possible esophagram or barium swallow study.  Gastroesophageal reflux disease Reports some reflux symptoms. No prior treatment with heartburn medication. Potential inflammation or scarring contributing to symptoms. - Prescribe a proton pump inhibitor for 4-6 weeks to reduce acid and assess improvement in reflux and swallowing issues. - Protonix  20 mg daily given.  Allergic rhinitis Mild symptoms with some fluid in the ear, possibly weather-related. Currently taking Allegra  as needed. - Continue Allegra  as needed for allergy symptoms.  Hypertension Blood pressure is well-controlled with current medication regimen.    Education regarding management of these chronic disease states was given. Management strategies discussed on successive visits include dietary and exercise recommendations, goals of achieving and maintaining IBW, and lifestyle modifications aiming for adequate sleep and minimizing stressors.  Follow up:  6 months for  complete physical  No orders of the defined types were placed in this encounter.  Meds ordered this encounter  Medications   cyclobenzaprine (FLEXERIL) 5 MG tablet    Sig: Take 1 tablet (5 mg total) by mouth 3 (three) times daily as needed for muscle spasms.    Dispense:  30 tablet    Refill:  1   pantoprazole  (PROTONIX ) 20 MG tablet    Sig: Take 1 tablet (20 mg total) by mouth daily.    Dispense:  90 tablet    Refill:  0      BP Readings from Last 3 Encounters:  09/28/24 138/70  08/12/24 126/70  05/13/24 (!) 152/82   Wt Readings from Last 3 Encounters:  09/28/24 177 lb 12.8 oz (80.6 kg)  08/24/24 177 lb (80.3 kg)  08/20/24 177 lb 3.2 oz (80.4 kg)    Lab Results  Component Value Date   CHOL 127 03/25/2024   CHOL 144 01/15/2023   CHOL 119 01/11/2022   Lab Results  Component Value Date   HDL 53.30 03/25/2024   HDL 53.10 01/15/2023   HDL 49.30 01/11/2022   Lab Results  Component Value Date   LDLCALC 59 03/25/2024   LDLCALC 74 01/15/2023   LDLCALC 56 01/11/2022   Lab Results  Component Value Date   TRIG 70.0 03/25/2024   TRIG 83.0 01/15/2023   TRIG 68.0 01/11/2022   Lab Results  Component Value Date   CHOLHDL 2 03/25/2024   CHOLHDL 3 01/15/2023   CHOLHDL 2 01/11/2022   Lab Results  Component Value Date   LDLDIRECT 100.9 05/28/2007   LDLDIRECT 109.2 01/28/2007   Lab Results  Component Value Date   CREATININE 0.84 08/12/2024   BUN 18 08/12/2024   NA 136 08/12/2024   K 4.1 08/12/2024   CL 103 08/12/2024   CO2 25 08/12/2024    The ASCVD Risk score (Arnett DK, et al., 2019) failed to calculate for the following reasons:   The valid total cholesterol range is 130 to 320 mg/dL  I reviewed the patients updated PMH, FH, and SocHx.    Patient Active Problem List   Diagnosis Date Noted   Psoriasis 12/27/2020    Priority: High   Rheumatoid arthritis involving multiple sites with positive rheumatoid factor (HCC) 10/31/2017    Priority: High    Immunosuppressed status 10/08/2017    Priority: High   Obesity with body mass index 30 or greater 01/25/2017    Priority: High   Current chronic use of systemic steroids 06/08/2016    Priority: High   Essential hypertension 10/20/2008    Priority: High   Mixed hyperlipidemia 04/23/2008    Priority: High   Aortic stenosis 08/16/2024    Priority: Medium    H/O partial resection of colon 01/31/2018    Priority: Medium    Leukocytosis 08/27/2014    Priority: Medium    Fatty liver disease, nonalcoholic 11/24/2010    Priority: Medium    Benign colon polyp 10/20/2008    Priority: Medium    Diverticulosis of colon 10/20/2008    Priority: Medium    Venous (peripheral) insufficiency 04/23/2008    Priority: Medium    Gastroesophageal reflux disease without esophagitis 04/23/2008    Priority: Medium    Bilateral sensorineural hearing loss 02/02/2019    Priority: Low   Uses hearing aid 12/09/2015    Priority: Low   Thrombocytopenia 11/23/2013    Priority: Low   Allergic rhinitis 04/23/2008  Priority: Low   Tympanic membrane perforation 08/24/2024   Screening for osteoporosis 10/09/2021    Allergies: Remicade [infliximab], Bee venom, Poison ivy extract, Adalimumab, Golimumab , Hydroxychloroquine , and Secukinumab  (300 mg dose)  Social History: Patient  reports that she quit smoking about 22 years ago. Her smoking use included cigarettes. She started smoking about 37 years ago. She has a 7.5 pack-year smoking history. She has never used smokeless tobacco. She reports current alcohol  use. She reports that she does not use drugs.  Current Meds  Medication Sig   acetaminophen  (TYLENOL ) 500 MG tablet Take 1,000 mg by mouth every 6 (six) hours as needed for moderate pain or headache.    ALPRAZolam  (XANAX ) 0.5 MG tablet Take 0.5-1 tablets (0.25-0.5 mg total) by mouth daily as needed for anxiety.   aspirin  EC 81 MG tablet Take 81 mg by mouth every Monday, Wednesday, and Friday. Swallow  whole.   Calcium  Carbonate-Vitamin D  600-400 MG-UNIT tablet Take 1 tablet by mouth 2 (two) times daily.    certolizumab pegol (CIMZIA) 2 X 200 MG KIT as directed Subcutaneous   Cholecalciferol  (VITAMIN D ) 50 MCG (2000 UT) CAPS Take 2,000 Units by mouth daily.    clobetasol (TEMOVATE) 0.05 % external solution 1 application Externally Twice a day for 14 days   cyclobenzaprine (FLEXERIL) 5 MG tablet Take 1 tablet (5 mg total) by mouth 3 (three) times daily as needed for muscle spasms.   fenofibrate  160 MG tablet TAKE 1 TABLET BY MOUTH EVERYDAY AT BEDTIME   fexofenadine  (ALLEGRA ) 180 MG tablet Take 1 tablet (180 mg total) by mouth daily.   fluticasone  (FLONASE ) 50 MCG/ACT nasal spray SPRAY 2 SPRAYS INTO EACH NOSTRIL DAILY AS NEEDED FOR ALLERGIES   folic acid (FOLVITE) 1 MG tablet TAKE 1 TABLET BY MOUTH EVERY DAY FOR 90 DAYS Oral for 90 Days   furosemide  (LASIX ) 20 MG tablet Take 1 tablet (20 mg total) by mouth daily as needed for edema. Take with potassium   ibuprofen (ADVIL) 200 MG tablet 1 tablet as needed Orally every 6 hrs   lisinopril  (ZESTRIL ) 40 MG tablet Take 1 tablet (40 mg total) by mouth daily.   methotrexate (RHEUMATREX) 2.5 MG tablet Take 15 mg by mouth once a week.   Multiple Vitamin (MULTIVITAMIN) tablet Take 1 tablet by mouth daily.   pantoprazole  (PROTONIX ) 20 MG tablet Take 1 tablet (20 mg total) by mouth daily.   polyethylene glycol (MIRALAX ) 17 g packet Take 17 g by mouth daily.   potassium chloride  SA (KLOR-CON ) 20 MEQ tablet Take 1 tablet (20 mEq total) by mouth daily as needed (leg swelling). Take with furosemide    prednisoLONE acetate (PRED FORTE) 1 % ophthalmic suspension 1 drop 3 (three) times daily.   predniSONE  (DELTASONE ) 5 MG tablet Take 5 mg by mouth daily.   simvastatin  (ZOCOR ) 40 MG tablet TAKE 1 TABLET BY MOUTH EVERYDAY AT BEDTIME   terbinafine  (LAMISIL ) 250 MG tablet Take 1 tablet (250 mg total) by mouth daily.   valACYclovir  (VALTREX ) 500 MG tablet Take 500 mg by  mouth 2 (two) times daily.    Review of Systems: Cardiovascular: negative for chest pain, palpitations, leg swelling, orthopnea Respiratory: negative for SOB, wheezing or persistent cough Gastrointestinal: negative for abdominal pain Genitourinary: negative for dysuria or gross hematuria  Objective  Vitals: BP 138/70   Pulse 76   Temp 97.7 F (36.5 C)   Ht 5' 4 (1.626 m)   Wt 177 lb 12.8 oz (80.6 kg)   LMP  (  LMP Unknown)   SpO2 99%   BMI 30.52 kg/m  General: no acute distress  Psych:  Alert and oriented, normal mood and affect, appears happy today HEENT:  Normocephalic, atraumatic, supple neck, chronic perforation right TM Cardiovascular:  RRR systolic murmur present, no edema Respiratory:  Good breath sounds bilaterally, CTAB with normal respiratory effort Skin:  Warm, no rashes Neurologic:   Mental status is normal Commons side effects, risks, benefits, and alternatives for medications and treatment plan prescribed today were discussed, and the patient expressed understanding of the given instructions. Patient is instructed to call or message via MyChart if he/she has any questions or concerns regarding our treatment plan. No barriers to understanding were identified. We discussed Red Flag symptoms and signs in detail. Patient expressed understanding regarding what to do in case of urgent or emergency type symptoms.  Medication list was reconciled, printed and provided to the patient in AVS. Patient instructions and summary information was reviewed with the patient as documented in the AVS. This note was prepared with assistance of Dragon voice recognition software. Occasional wrong-word or sound-a-like substitutions may have occurred due to the inherent limitation

## 2024-09-28 NOTE — Patient Instructions (Signed)
 Please return in 6 months for complete physical  If you have any questions or concerns, please don't hesitate to send me a message via MyChart or call the office at [phone]. Thank you for visiting with us  today! It's our pleasure caring for you.    VISIT SUMMARY: Today, you were seen for persistent right-sided headaches and swallowing difficulties. We discussed your headaches, swallowing issues, reflux symptoms, allergies, and overall health status. You also mentioned your recent hospitalization and current recovery status.  YOUR PLAN: -TENSION-TYPE HEADACHES: Tension-type headaches are often caused by muscle tension and stress, leading to pain that can radiate to the ear and neck. You will start a low-dose muscle relaxer at night for 2-3 nights to help alleviate the muscle tension.  -DYSPHAGIA AND POSSIBLE OROPHARYNGEAL DYSFUNCTION: Dysphagia means difficulty swallowing, which can be due to structural issues in the esophagus. You will be referred to a GI specialist, Dr. Abran, for further evaluation and possibly an esophagram or barium swallow study.  -GASTROESOPHAGEAL REFLUX DISEASE: Gastroesophageal reflux disease (GERD) occurs when stomach acid frequently flows back into the tube connecting your mouth and stomach. You will start a proton pump inhibitor for 4-6 weeks to reduce acid and see if it improves your reflux and swallowing issues.  -ALLERGIC RHINITIS: Allergic rhinitis is an allergic reaction that causes sneezing, congestion, and a runny nose. Continue taking Allegra  as needed for your allergy symptoms.  -HYPERTENSION: Hypertension is high blood pressure. Your blood pressure is well-controlled with your current medication regimen.  INSTRUCTIONS: Please follow up with the GI specialist, Dr. Abran, for your swallowing difficulties. Continue taking your prescribed medications as discussed. If you experience any new or worsening symptoms, please contact our  office.                      Contains text generated by Abridge.                                 Contains text generated by Abridge.

## 2024-10-27 ENCOUNTER — Ambulatory Visit (INDEPENDENT_AMBULATORY_CARE_PROVIDER_SITE_OTHER): Admitting: Family Medicine

## 2024-10-27 ENCOUNTER — Ambulatory Visit: Admitting: Family Medicine

## 2024-10-27 ENCOUNTER — Ambulatory Visit (INDEPENDENT_AMBULATORY_CARE_PROVIDER_SITE_OTHER)

## 2024-10-27 VITALS — BP 160/78 | HR 66 | Ht 64.0 in | Wt 180.0 lb

## 2024-10-27 DIAGNOSIS — M25562 Pain in left knee: Secondary | ICD-10-CM | POA: Diagnosis not present

## 2024-10-27 DIAGNOSIS — M25561 Pain in right knee: Secondary | ICD-10-CM

## 2024-10-27 DIAGNOSIS — M25532 Pain in left wrist: Secondary | ICD-10-CM | POA: Diagnosis not present

## 2024-10-27 DIAGNOSIS — M25511 Pain in right shoulder: Secondary | ICD-10-CM

## 2024-10-27 NOTE — Patient Instructions (Addendum)
 Thank you for coming in today.   Please get an Xray today before you leave   Try using a Compression Sleeve on your knee  I recommend using a Walker  Check back in 2 weeks

## 2024-10-27 NOTE — Progress Notes (Signed)
 Morgan Ileana Collet, PhD, LAT, ATC acting as a scribe for Artist Lloyd, MD.  Morgan [name] is a 73 y.o. female who presents to [facility] at [facility] today for bilat knee pain. Pt was previously seen by Dr. Lloyd on 05/13/24 for bilat knee pain after a fall.  Today, pt c/o bilat knee pain, R>L, after another fall on Nov 21st. She was walking down the aisle at [employer] and was looking around. There was a handle sticking out, hit her R lower leg, falling forward, landing on both knees. She notes a tear along the lateral aspect of her R forearm.  She was at the [facility] care following the incident.   She does also have some left knee and right shoulder pain as well.  This is less dominant than the right knee pain.  She is able to walk but does have some limping.  Pertinent review of systems: No fevers or chills  Relevant historical information: Hypertension and rheumatoid arthritis   Exam:  BP (!) 160/78   Pulse 66   Ht 5' 4 (1.626 m)   Wt 180 lb (81.6 kg)   LMP  (LMP Unknown)   SpO2 99%   BMI 30.90 kg/m  General: Well Developed, well nourished, and in no acute distress.   MSK: Right knee tender palpation anterior knee.  Range of motion is intact.  Pain is present with resisted knee extension.  Strength is diminished but generally intact.  Antalgic gait.  Left knee minimal bruising mildly tender to palpation.  Normal motion.  Left wrist tender to palpation decreased motion.  Right shoulder with mildly limited motion.    Lab and Radiology Results  X-ray images right shoulder and left knee obtained today personally and independently interpreted.  Right shoulder: Right reverse total shoulder replacement.  No acute fracture or hardware malalignment.  There is a small old bone avulsion from the inferior glenoid that is stable from prior x-ray.  Left knee: No acute fractures minimal degenerative changes.  Await formal radiology review  X-RAY LEFT  WRIST (3+ VIEWS), 10/23/2024 5:04 PM  INDICATION: Unspecified fall, initial encounter \ W19.XXXA Unspecified fall, initial encounter COMPARISON: None.  IMPRESSION: 1.  No acute fracture. 2.  The lunate is abnormally small in morphology, with heterogeneous sclerosis and mixed lucency with cortical flattening, compatible with chronic changes of lunate avascular necrosis. 3.  Volar lunate tilt with widening of the lunotriquetral interval, concerning for volar intercalated segment instability and lunotriquetral ligament disruption. 4.  Mild first CMC, triscaphe and intercarpal degenerative changes. 5.  Triangular fibrocartilage chondrocalcinosis. 6.  Diffuse osteopenia. Exam End: 10/23/24 17:04    X-RAY KNEE RIGHT (3 VIEWS), 10/23/2024 5:04 PM  INDICATION: Unspecified fall, initial encounter \ W19.XXXA Unspecified fall, initial encounter COMPARISON: None.  IMPRESSION: 1.  Probable nondisplaced acute lateral patellar pole fracture with thin patellar lucency best appreciated on the sunrise view. Correlation with point tenderness is advised. 2.  No malalignment. 3.  Small knee joint effusion. 4.  Joint spaces are maintained with superimposed meniscal chondrocalcinosis medially. Exam End: 10/23/24 17:04     I, Artist Lloyd, personally (independently) visualized and performed the interpretation of the images attached in this note.  X-ray images reviewed on patient provided CD.    Assessment and Plan: 73 y.o. female with bilateral knee pain right worse than left, left wrist pain, and right shoulder pain as result of a fall occurring a few days ago.  Patient had x-rays of her right  knee and left wrist at urgent care at [facility] 3 days ago.  The knee x-ray is concerning for a tiny avulsion fracture of the patella.  She has good weightbearing and reasonably intact strength.  I do not think we need a knee immobilizer.  She does not want 1 either.  Plan for using a walker to limit weightbearing and  recheck in 2 weeks with anticipated repeat x-ray during that visit.  Left wrist pain.  Patient did not have any acute fractures on x-ray at [facility] on the 21st.  She does have old changes including avascular necrosis of the lunate.  Plan for a bit of watchful waiting consider repeat x-ray.  Left knee pain contusion watchful waiting.  Right shoulder pain in the setting of reverse total shoulder replacement.  No acute fracture per my read.  Await radiology overread.  Recheck in 2 weeks anticipate repeat knee and left wrist x-ray during that visit.   PDMP not reviewed this encounter. Orders Placed This Encounter  Procedures   DG Knee AP/LAT W/Sunrise Left    Standing Status:   Future    Number of Occurrences:   1    Expiration Date:   11/26/2024    Reason for Exam (SYMPTOM  OR DIAGNOSIS REQUIRED):   bilateral knee pain    Preferred imaging location?:   [facility]   DG Shoulder Right    Standing Status:   Future    Number of Occurrences:   1    Expiration Date:   10/27/2025    Reason for Exam (SYMPTOM  OR DIAGNOSIS REQUIRED):   right shoulder pain    Preferred imaging location?:   [place] [place]   No orders of the defined types were placed in this encounter.    Discussed warning signs or symptoms. Please see discharge instructions. Patient expresses understanding.   The above documentation has been reviewed and is accurate and complete Artist Lloyd, M.D.

## 2024-11-04 ENCOUNTER — Ambulatory Visit: Payer: Self-pay | Admitting: Family Medicine

## 2024-11-04 NOTE — Progress Notes (Signed)
 Right shoulder x-ray shows no evidence of hardware loosening.  The shoulder replacement looks okay.

## 2024-11-04 NOTE — Progress Notes (Signed)
Left knee x-ray looks okay to radiology

## 2024-11-10 ENCOUNTER — Ambulatory Visit: Admitting: Family Medicine

## 2024-11-11 ENCOUNTER — Ambulatory Visit

## 2024-11-11 ENCOUNTER — Ambulatory Visit: Admitting: Family Medicine

## 2024-11-11 VITALS — BP 156/82 | HR 87 | Ht 64.0 in

## 2024-11-11 DIAGNOSIS — M25521 Pain in right elbow: Secondary | ICD-10-CM | POA: Diagnosis not present

## 2024-11-11 DIAGNOSIS — M25561 Pain in right knee: Secondary | ICD-10-CM | POA: Diagnosis not present

## 2024-11-11 DIAGNOSIS — M25532 Pain in left wrist: Secondary | ICD-10-CM

## 2024-11-11 DIAGNOSIS — M25562 Pain in left knee: Secondary | ICD-10-CM

## 2024-11-11 NOTE — Patient Instructions (Addendum)
 Thank you for coming in today.   Wear the new splint on your wrist  Check back in 2 weeks

## 2024-11-11 NOTE — Progress Notes (Signed)
 LILLETTE Ileana Collet, PhD, LAT, ATC acting as a scribe for Artist Lloyd, MD.  Morgan [name] is a 73 y.o. female who presents to [facility] at [facility] today for f/u bilat knee, R shoulder and L wrist pain resulting from a fall. Pt was last seen by Dr. Lloyd on 10/27/24 and was advised to use a walker and limit weightbearing.  Today, pt reports she has been using the walker. R knee pain hurts w/ transitioning to sit/stand. L wrist is very painful, no improvement. Difficult to grip. She also notes pain on the lateral aspect of R elbow. The wound on her L elbow does not look good and would like some evaluation do to her hx of prior skin injections.   Pertinent review of systems: No fevers or chills  Relevant historical information: Hypertension   Exam:  BP (!) 156/82   Pulse 87   Ht 5' 4 (1.626 m)   LMP  (LMP Unknown)   SpO2 95%   BMI 30.90 kg/m  General: Well Developed, well nourished, and in no acute distress.   MSK: Left wrist some swelling is present.  Tender palpation snuffbox area.  Decreased motion.  Right elbow no effusion.  Mildly tender to palpation radial head.  Healing skin tear or abrasion lateral elbow.  Right knee normal motion intact strength.    Lab and Radiology Results  X-ray images right knee left wrist and right elbow obtained today personally and independently interpreted.  Right knee: Stable appearing possible avulsion fracture lateral aspect of patella.  Left wrist stable appearing AVN of the lunate.  Degenerative changes present throughout the wrist no acute fractures are visible.  Right elbow no acute fracture is visible.  Mild degenerative changes are present.  Await formal radiology review.    Assessment and Plan: 73 y.o. female with exacerbation of chronic pain after a fall.  Left wrist pain is the most dominant issue.  She has significant chronic abnormalities already identified in the wrist which likely are worse after  this fall.  Plan for thumb spica wrist brace and recheck in about 2 weeks.  Right knee pain is improving.  Advance activity as tolerated.  Right elbow pain is also improving.  Watchful waiting for now.   PDMP not reviewed this encounter. Orders Placed This Encounter  Procedures   DG Knee AP/LAT W/Sunrise Right    Standing Status:   Future    Number of Occurrences:   1    Expiration Date:   12/12/2024    Reason for Exam (SYMPTOM  OR DIAGNOSIS REQUIRED):   right knee pain    Preferred imaging location?:   [facility]   DG Wrist Complete Left    Standing Status:   Future    Number of Occurrences:   1    Expiration Date:   12/12/2024    Reason for Exam (SYMPTOM  OR DIAGNOSIS REQUIRED):   left wrist pain    Preferred imaging location?:   [place]   DG ELBOW COMPLETE RIGHT (3+VIEW)    Standing Status:   Future    Number of Occurrences:   1    Expiration Date:   12/12/2024    Reason for Exam (SYMPTOM  OR DIAGNOSIS REQUIRED):   right elbow pain    Preferred imaging location?:   [place]   No orders of the defined types were placed in this encounter.    Discussed warning signs or symptoms. Please see  discharge instructions. Patient expresses understanding.   The above documentation has been reviewed and is accurate and complete Artist Lloyd, M.D.

## 2024-11-17 ENCOUNTER — Ambulatory Visit: Payer: Self-pay | Admitting: Family Medicine

## 2024-11-17 DIAGNOSIS — C9000 Multiple myeloma not having achieved remission: Secondary | ICD-10-CM

## 2024-11-17 NOTE — Progress Notes (Signed)
 Right knee x-ray shows mild arthritis.

## 2024-11-17 NOTE — Progress Notes (Signed)
 Right elbow x-ray does not show any fractures or severe arthritis.  However there are some lucencies in the bone that are probably osteoporosis but could be multiple myeloma which does require some testing.  I have ordered some lab test.  You can swing by and get that done at your convenience.

## 2024-11-17 NOTE — Progress Notes (Signed)
 Left wrist x-ray again shows no acute fractures but does show probable avascular necrosis of the lunate bone and some lucency through the radius that could be myeloma.  Lab test ordered.  Wrist looks like you have had wrist injuries in the past.

## 2024-11-18 ENCOUNTER — Other Ambulatory Visit

## 2024-11-18 DIAGNOSIS — C9000 Multiple myeloma not having achieved remission: Secondary | ICD-10-CM

## 2024-11-18 NOTE — Telephone Encounter (Signed)
 Reordered labs.

## 2024-11-19 ENCOUNTER — Ambulatory Visit

## 2024-11-19 ENCOUNTER — Ambulatory Visit: Admitting: Podiatry

## 2024-11-19 VITALS — Ht 64.0 in | Wt 180.0 lb

## 2024-11-19 DIAGNOSIS — B351 Tinea unguium: Secondary | ICD-10-CM | POA: Diagnosis not present

## 2024-11-19 DIAGNOSIS — M79672 Pain in left foot: Secondary | ICD-10-CM

## 2024-11-19 DIAGNOSIS — M9262 Juvenile osteochondrosis of tarsus, left ankle: Secondary | ICD-10-CM | POA: Diagnosis not present

## 2024-11-19 NOTE — Progress Notes (Signed)
°  Subjective:  Patient ID: Morgan [name], female    DOB: [birth year],  MRN: [id number]  Chief Complaint  Patient presents with   Nail Problem    RM 10 Patient is here to f/u on fungal nail treatment. Pt states some concern with burning sensation in the left achilles tendon area after a fall on 10/23/24.    73 y.o. female presents with the above complaint. History confirmed with patient.  Nails have improved.  Had a recent fall and has pain in the back of the heel seems to be randomly worse when she is in bed or resting  Objective:  Physical Exam: warm, good capillary refill, no trophic changes or ulcerative lesions, normal DP and PT pulses, and normal sensory exam.  Onychomycosis essentially resolved some residual dystrophy and thickening.  She has no tenderness to palpation of the posterior heel or any Achilles significant lateral compression.  Radiographs left heel do show a Haglund deformity no posterior heel spur no fracture or stress fracture or abnormality   Assessment:   1. Haglund deformity of left heel   2. Onychomycosis      Plan:  Patient was evaluated and treated and all questions answered.  Nails have improved I do not expect she will need further antifungal therapy I debrided them with a nail rotary bur today to the thickness for her as a courtesy.  Follow-up as needed for this.    Regarding the heel pain she is having I suspect likely she is getting pressure on the heel secondary to the injury she may have some neuropathic pain as well.  Recommend offloading with a pillow in the calf.  Follow-up as needed for this.   No follow-ups on file.

## 2024-11-20 LAB — PROTEIN ELECTROPHORESIS, SERUM
Albumin ELP: 4.3 g/dL (ref 3.8–4.8)
Alpha 1: 0.3 g/dL (ref 0.2–0.3)
Alpha 2: 0.6 g/dL (ref 0.5–0.9)
Beta 2: 0.4 g/dL (ref 0.2–0.5)
Beta Globulin: 0.5 g/dL (ref 0.4–0.6)
Gamma Globulin: 1.1 g/dL (ref 0.8–1.7)
Total Protein: 7.2 g/dL (ref 6.1–8.1)

## 2024-11-23 ENCOUNTER — Ambulatory Visit: Payer: Self-pay | Admitting: Family Medicine

## 2024-11-23 NOTE — Progress Notes (Signed)
 Labs look okay.

## 2024-11-24 NOTE — Progress Notes (Signed)
 "        I, Leotis Batter, CMA acting as a scribe for Artist Lloyd, MD.  Morgan [name] is a 73 y.o. female who presents to [facility] at [facility] today for 2-wk bilat knee, L wrist, and R elbow pain resulting from a fall. Pt was last seen by Dr. Lloyd on 09/13/24 and was placed in a thumb spica wrist brace.  Today, pt reports continued soreness in both knees and left wrist. Elbow sx have improved. Wearing wrist brace today and ambulating with a cane. Minimal pain with ambulation, more pain with sit-to-stand and stairs. Using a walker more than cane. Walks around the house without the walker. Taking Tylenol  or Advil prn for pain with some relief.   Pertinent review of systems: No fevers or chills  Relevant historical information: History of leukocytosis previously seen by hematology oncology.  Rheumatoid arthritis managed by rheumatology.   Exam:  BP (!) 164/82   Pulse 76   Ht 5' 4 (1.626 m)   Wt 180 lb (81.6 kg)   LMP  (LMP Unknown)   SpO2 98%   BMI 30.90 kg/m  General: Well Developed, well nourished, and in no acute distress.   MSK: Bilateral knees mild effusion normal motion with crepitation.  Intact strength.  Left wrist mildly tender palpation.    Lab and Radiology Results  Procedure: Real-time Ultrasound Guided Injection of right knee joint superior lateral patella space Device: Philips Affiniti 50G/GE Logiq Images permanently stored and available for review in PACS Verbal informed consent obtained.  Discussed risks and benefits of procedure. Warned about infection, bleeding, hyperglycemia damage to structures among others. Patient expresses understanding and agreement Time-out conducted.   Noted no overlying erythema, induration, or other signs of local infection.   Skin prepped in a sterile fashion.   Local anesthesia: Topical Ethyl chloride.   With sterile technique and under real time ultrasound guidance: 40 mg of Depo-Medrol  and 2 mL of Marcaine   injected into knee joint. Fluid seen entering the joint capsule.   Completed without difficulty   Pain immediately resolved suggesting accurate placement of the medication.   Advised to call if fevers/chills, erythema, induration, drainage, or persistent bleeding.   Images permanently stored and available for review in the ultrasound unit.  Impression: Technically successful ultrasound guided injection.   Procedure: Real-time Ultrasound Guided Injection of left knee joint superior lateral patella space Device: Philips Affiniti 50G/GE Logiq Images permanently stored and available for review in PACS Verbal informed consent obtained.  Discussed risks and benefits of procedure. Warned about infection, bleeding, hyperglycemia damage to structures among others. Patient expresses understanding and agreement Time-out conducted.   Noted no overlying erythema, induration, or other signs of local infection.   Skin prepped in a sterile fashion.   Local anesthesia: Topical Ethyl chloride.   With sterile technique and under real time ultrasound guidance: 40 mg of Depo-Medrol  and 2 mL of Marcaine  injected into knee joint. Fluid seen entering the joint capsule.   Completed without difficulty   Pain immediately resolved suggesting accurate placement of the medication.   Advised to call if fevers/chills, erythema, induration, drainage, or persistent bleeding.   Images permanently stored and available for review in the ultrasound unit.  Impression: Technically successful ultrasound guided injection.   DG Foot Complete Left Result Date: 11/20/2024 Please see detailed radiograph report in office note.  DG Wrist Complete Left Result Date: 11/17/2024 EXAM: 3 or more VIEW(S) XRAY OF THE LEFT WRIST 11/11/2024 10:47:25  AM COMPARISON: None available. CLINICAL HISTORY: left wrist pain FINDINGS: BONES AND JOINTS: Irregular lunate with sclerosis. Mottled lucency throughout the distal radius and ulna. Widening of  the scapholunate space. Mild first CMC degenerative change. Triscaphe degenerative change. SOFT TISSUES: The soft tissues are unremarkable. IMPRESSION: 1. Irregular lunate with sclerosis this is likely related to avascular necrosis. 2. Mottled lucency throughout the distal radius and ulna. Laboratory evaluation may be helpful to rule out underlying bony abnormalities such as myeloma. Electronically signed by: Oneil Devonshire MD 11/17/2024 12:50 AM EST RP Workstation: [id number]   DG ELBOW COMPLETE RIGHT (3+VIEW) Result Date: 11/17/2024 EXAM: 3 VIEW(S) XRAY OF THE RIGHT ELBOW COMPARISON: None available. CLINICAL HISTORY: remote fall several weeks ago with persistent elbow pain FINDINGS: BONES AND JOINTS: No acute fracture. No malalignment. Some increased lucencies are noted within the proximal radius and distal humerus. This may simply be related to osteopenia, although the possibility of a more aggressive process such as multiple myeloma could not be totally excluded. Laboratory correlation may be helpful. SOFT TISSUES: The soft tissues are unremarkable. IMPRESSION: 1. No acute fracture or malalignment. 2. Increased lucencies within the proximal radius and distal humerus, which may reflect osteopenia; however, an aggressive process such as multiple myeloma cannot be excluded. Consider appropriate laboratory evaluation and/or further imaging for characterization. Electronically signed by: Oneil Devonshire MD 11/17/2024 12:49 AM EST RP Workstation: [id number]   DG Knee AP/LAT W/Sunrise Right Result Date: 11/17/2024 EXAM: 3 VIEW(S) XRAY OF THE RIGHT KNEE 11/11/2024 10:47:25 AM COMPARISON: 05/13/2024 CLINICAL HISTORY: right knee pain FINDINGS: BONES AND JOINTS: No acute fracture. No malalignment. No significant joint effusion. Mild medial compartment joint space narrowing. SOFT TISSUES: Atherosclerotic vascular calcifications. IMPRESSION: 1. Mild medial compartment osteoarthrosis of the right knee. Electronically signed  by: Oneil Devonshire MD 11/17/2024 12:48 AM EST RP Workstation: [id number]   DG Shoulder Right Result Date: 11/03/2024 CLINICAL DATA:  Right shoulder pain EXAM: RIGHT SHOULDER - 2+ VIEW COMPARISON:  Right shoulder x-ray 05/13/2024 FINDINGS: Right shoulder arthroplasty is in anatomic alignment. There is no evidence for hardware loosening or acute fracture. Soft tissues are within normal limits. IMPRESSION: Right shoulder arthroplasty in anatomic alignment. Electronically Signed   By: Greig Pique M.D.   On: 11/03/2024 23:02   DG Knee AP/LAT W/Sunrise Left Result Date: 11/03/2024 CLINICAL DATA:  Bilateral knee pain EXAM: LEFT KNEE 3 VIEWS COMPARISON:  None Available. FINDINGS: No evidence of fracture, dislocation, or joint effusion. No evidence of arthropathy or other focal bone abnormality. Soft tissues are unremarkable. IMPRESSION: Negative. Electronically Signed   By: Greig Pique M.D.   On: 11/03/2024 23:01   I, Artist Lloyd, personally (independently) visualized and performed the interpretation of the images attached in this note.  Recent Results (from the past 2160 hours)  Protein Electrophoresis, (serum)     Status: None   Collection Time: 11/18/24 12:31 PM  Result Value Ref Range   Total Protein 7.2 6.1 - 8.1 g/dL   Albumin ELP 4.3 3.8 - 4.8 g/dL   Alpha 1 0.3 0.2 - 0.3 g/dL   Alpha 2 0.6 0.5 - 0.9 g/dL   Beta Globulin 0.5 0.4 - 0.6 g/dL   Beta 2 0.4 0.2 - 0.5 g/dL   Gamma Globulin 1.1 0.8 - 1.7 g/dL   SPE Interp.      Comment: Normal Serum Protein Electrophoresis Pattern. No abnormal protein bands (M-protein) detected.        Assessment and Plan: 73 y.o. female with bilateral knee and left wrist  pain after fall due to contusion and strain and exacerbation of underlying degenerative changes.  Plan for bilateral steroid injection both knees today.  For left wrist continue wrist brace and advance activity as tolerated.  Anticipate recheck in 1 month.  Incidentally on x-rays we did  find a mottled appearance of several of the bones that we evaluated including the proximal radius and distal humerus, left wrist.  This is concerning for possible multiple myeloma.  I did arrange for an SPEP which thankfully was normal.  With these x-ray findings and history of leukocytosis in the past that previously was managed by hematology oncology I do think it is worthwhile to get her back in with hematology oncology to see if further evaluation is necessary.  Her bone findings are more likely to be related to her rheumatoid arthritis and the medication that she is taking for RA than multiple myeloma but further evaluation I believe is still warranted.   PDMP not reviewed this encounter. Orders Placed This Encounter  Procedures   US  LIMITED JOINT SPACE STRUCTURES LOW BILAT(NO LINKED CHARGES)    Reason for Exam (SYMPTOM  OR DIAGNOSIS REQUIRED):   bilat knee pain    Preferred imaging location?:   [facility] referral to Hematology / Oncology    Referral Priority:   Routine    Referral Type:   Consultation    Referral Reason:   Specialty Services Required    Requested Specialty:   Oncology    Number of Visits Requested:   1   Meds ordered this encounter  Medications   methylPREDNISolone  acetate (DEPO-MEDROL ) injection 40 mg   methylPREDNISolone  acetate (DEPO-MEDROL ) injection 40 mg     Discussed warning signs or symptoms. Please see discharge instructions. Patient expresses understanding.   The above documentation has been reviewed and is accurate and complete Artist Lloyd, M.D.   "

## 2024-11-25 ENCOUNTER — Encounter: Payer: Self-pay | Admitting: Family Medicine

## 2024-11-25 ENCOUNTER — Ambulatory Visit: Admitting: Family Medicine

## 2024-11-25 ENCOUNTER — Other Ambulatory Visit: Payer: Self-pay

## 2024-11-25 VITALS — BP 164/82 | HR 76 | Ht 64.0 in | Wt 180.0 lb

## 2024-11-25 DIAGNOSIS — M898X8 Other specified disorders of bone, other site: Secondary | ICD-10-CM

## 2024-11-25 DIAGNOSIS — M25561 Pain in right knee: Secondary | ICD-10-CM

## 2024-11-25 DIAGNOSIS — G8929 Other chronic pain: Secondary | ICD-10-CM

## 2024-11-25 DIAGNOSIS — M25562 Pain in left knee: Secondary | ICD-10-CM

## 2024-11-25 DIAGNOSIS — D72829 Elevated white blood cell count, unspecified: Secondary | ICD-10-CM

## 2024-11-25 MED ORDER — METHYLPREDNISOLONE ACETATE 40 MG/ML IJ SUSP
40.0000 mg | Freq: Once | INTRAMUSCULAR | Status: AC
  Administered 2024-11-25: 40 mg via INTRA_ARTICULAR

## 2024-11-25 NOTE — Patient Instructions (Addendum)
 Thank you for coming in today.   You received an injection today. Seek immediate medical attention if the joint becomes red, extremely painful, or is oozing fluid.   Referral placed to Hematology / Oncology.   See you back in 1 month.

## 2024-11-26 ENCOUNTER — Other Ambulatory Visit: Payer: Self-pay | Admitting: Family Medicine

## 2024-12-07 ENCOUNTER — Encounter: Payer: Self-pay | Admitting: Family Medicine

## 2024-12-11 ENCOUNTER — Ambulatory Visit: Admitting: Family Medicine

## 2024-12-11 ENCOUNTER — Encounter: Payer: Self-pay | Admitting: Family Medicine

## 2024-12-11 ENCOUNTER — Ambulatory Visit: Payer: Self-pay

## 2024-12-11 ENCOUNTER — Ambulatory Visit (HOSPITAL_BASED_OUTPATIENT_CLINIC_OR_DEPARTMENT_OTHER)
Admission: RE | Admit: 2024-12-11 | Discharge: 2024-12-11 | Disposition: A | Source: Ambulatory Visit | Attending: Family Medicine | Admitting: Family Medicine

## 2024-12-11 VITALS — BP 128/74 | HR 66 | Temp 97.9°F | Resp 18 | Ht 64.0 in | Wt 182.0 lb

## 2024-12-11 DIAGNOSIS — R1011 Right upper quadrant pain: Secondary | ICD-10-CM | POA: Diagnosis present

## 2024-12-11 DIAGNOSIS — K8020 Calculus of gallbladder without cholecystitis without obstruction: Secondary | ICD-10-CM | POA: Diagnosis not present

## 2024-12-11 DIAGNOSIS — M1991 Primary osteoarthritis, unspecified site: Secondary | ICD-10-CM | POA: Insufficient documentation

## 2024-12-11 DIAGNOSIS — M75100 Unspecified rotator cuff tear or rupture of unspecified shoulder, not specified as traumatic: Secondary | ICD-10-CM | POA: Insufficient documentation

## 2024-12-11 DIAGNOSIS — R110 Nausea: Secondary | ICD-10-CM

## 2024-12-11 DIAGNOSIS — M2550 Pain in unspecified joint: Secondary | ICD-10-CM | POA: Insufficient documentation

## 2024-12-11 DIAGNOSIS — M79643 Pain in unspecified hand: Secondary | ICD-10-CM | POA: Insufficient documentation

## 2024-12-11 DIAGNOSIS — L4050 Arthropathic psoriasis, unspecified: Secondary | ICD-10-CM | POA: Insufficient documentation

## 2024-12-11 LAB — COMPREHENSIVE METABOLIC PANEL WITH GFR
ALT: 22 U/L (ref 3–35)
AST: 32 U/L (ref 5–37)
Albumin: 4.4 g/dL (ref 3.5–5.2)
Alkaline Phosphatase: 51 U/L (ref 39–117)
BUN: 21 mg/dL (ref 6–23)
CO2: 31 meq/L (ref 19–32)
Calcium: 9.7 mg/dL (ref 8.4–10.5)
Chloride: 103 meq/L (ref 96–112)
Creatinine, Ser: 0.76 mg/dL (ref 0.40–1.20)
GFR: 76.52 mL/min
Glucose, Bld: 91 mg/dL (ref 70–99)
Potassium: 4.3 meq/L (ref 3.5–5.1)
Sodium: 139 meq/L (ref 135–145)
Total Bilirubin: 0.6 mg/dL (ref 0.2–1.2)
Total Protein: 7 g/dL (ref 6.0–8.3)

## 2024-12-11 LAB — POCT URINALYSIS DIP (MANUAL ENTRY)
Bilirubin, UA: NEGATIVE
Glucose, UA: NEGATIVE mg/dL
Ketones, POC UA: NEGATIVE mg/dL
Leukocytes, UA: NEGATIVE
Nitrite, UA: NEGATIVE
Spec Grav, UA: 1.015
Urobilinogen, UA: 0.2 U/dL
pH, UA: 6

## 2024-12-11 LAB — CBC
HCT: 37.1 % (ref 36.0–46.0)
Hemoglobin: 12.3 g/dL (ref 12.0–15.0)
MCHC: 33.2 g/dL (ref 30.0–36.0)
MCV: 92.7 fl (ref 78.0–100.0)
Platelets: 71 K/uL — ABNORMAL LOW (ref 150.0–400.0)
RBC: 4 Mil/uL (ref 3.87–5.11)
RDW: 16.5 % — ABNORMAL HIGH (ref 11.5–15.5)
WBC: 8.9 K/uL (ref 4.0–10.5)

## 2024-12-11 NOTE — Telephone Encounter (Signed)
 Noted

## 2024-12-11 NOTE — Patient Instructions (Addendum)
 Thank you for coming in today.  As we discussed your symptoms are suspicious for possible gallbladder issue.  I have arranged for an ultrasound this afternoon at 4:15pm.  [facility], see address below.  Nothing to eat or drink prior to that time.  Once I receive the report of that ultrasound and labs I will let you know.  Hang in there!  [facility] Address: [address] [place], [place] [address] Phone: [phone]

## 2024-12-11 NOTE — Progress Notes (Unsigned)
 "  Subjective:  Patient ID: Morgan [name], female    DOB: Nov 30, 1951  Age: 74 y.o. MRN: [id number]  CC:  Chief Complaint  Patient presents with   Acute Visit    RUQ pain. Sx started Tuesday. No changes. Some nausea. No v/d. No fever.     HPI Morgan [name] presents for  Acute visit for above. Primary care provider is Dr. Celena.  Right upper quadrant abdominal pain Noted R upper abdominal pain starting 3 days ago. Worse during the day. No fever. Thought was possible gas - no relief with gas-x  Persistent pain since 3 days ago - dull ache that turns sharp at times.  Felt radiating pain to back twice yesterday.  No recent UTI, no dysuria, hematuria/urgency or frequency.  No vomiting. Some nausea - primarily 2 days ago.  About the same today.  Able to eat, drink fluids.  Baked spaghetti Tuesday, no change with eating past few days.   Tx: tylenol , gas - x, no relief.  PSH: colon resection in 2019, prior diverticulitis and abscess. Still has gallbladder, appendix.   On methotrexate, Cimzia with RA.   Last ate at 9:30 AM, few cheese/crackers.   History Patient Active Problem List   Diagnosis Date Noted   Pain of hand 12/11/2024   Primary localized osteoarthrosis of multiple sites 12/11/2024   Psoriatic arthritis (HCC) 12/11/2024   Rotator cuff tear arthropathy 12/11/2024   Multiple joint pain 12/11/2024   Tympanic membrane perforation 08/24/2024   Aortic stenosis 08/16/2024   Referred otalgia, right 08/13/2024   Throat pain in adult 08/13/2024   Screening for osteoporosis 10/09/2021   Psoriasis 12/27/2020   Bilateral sensorineural hearing loss 02/02/2019   H/O partial resection of colon 01/31/2018   Rheumatoid arthritis involving multiple sites with positive rheumatoid factor (HCC) 10/31/2017   Immunosuppressed status 10/08/2017   Obesity with body mass index 30 or greater 01/25/2017   Current chronic use of systemic steroids 06/08/2016   Uses hearing aid 12/09/2015    Leukocytosis 08/27/2014   Thrombocytopenia 11/23/2013   Fatty liver disease, nonalcoholic 11/24/2010   Benign colon polyp 10/20/2008   Essential hypertension 10/20/2008   Diverticulosis of colon 10/20/2008   Mixed hyperlipidemia 04/23/2008   Venous (peripheral) insufficiency 04/23/2008   Allergic rhinitis 04/23/2008   Gastroesophageal reflux disease without esophagitis 04/23/2008   Past Medical History:  Diagnosis Date   Abnormal chest x-ray    Abnormal electrocardiogram    Abscess of sigmoid colon due to diverticulitis 11/04/2017   Allergic rhinitis    Allergy    Cancer (HCC)    Skin -sqameous left hand basal cell face   Cataract    MD just watching   Colon polyp    polypoid colorectal mucosa/adenomatous   Complication of anesthesia    and cries   Contact dermatitis due to poison ivy    Cystitis    Diverticulitis with abscess status post robotic low anterior rectosigmoid resection 12/11/2017 12/11/2017   Diffuse diverticulosis entire colon by colonoscopy 01/2019   Diverticulosis of colon    Fatty liver disease, nonalcoholic    GERD (gastroesophageal reflux disease)    occasional   Hearing loss    bilateral, wears hearing aids   Hemorrhoids    Hyperlipidemia    Hypertension    Lymphocytosis    Overweight(278.02)    Pneumonia    2017   PONV (postoperative nausea and vomiting)    Psoriasis 12/27/2020   RA (rheumatoid arthritis) (HCC)    Rheumatoid  S/P reverse total shoulder arthroplasty, 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   Venous insufficiency    Past Surgical History:  Procedure Laterality Date   APPLICATION OF A-CELL OF EXTREMITY Left 06/09/2020   Procedure: APPLICATION OF INTEGRA BILAYER OR PRIMATRIX;  Surgeon: Elisabeth Craig RAMAN, MD;  [facility]: [facility];  Service: Plastics;  Laterality: Left;   APPLICATION OF WOUND VAC Left 06/09/2020   Procedure: APPLICATION OF WOUND VAC;  Surgeon: Elisabeth Craig RAMAN, MD;  Location: [facility];  Service: Plastics;  Laterality: Left;   APPLICATION OF WOUND  VAC Left 07/26/2020   Procedure: APPLICATION OF WOUND VAC;  Surgeon: Elisabeth Craig RAMAN, MD;  Location: [facility];  Service: Plastics;  Laterality: Left;  APPLICATION OF WOUND VAC   CATARACT EXTRACTION W/ INTRAOCULAR LENS IMPLANT Bilateral 2022   CESAREAN SECTION     [phone]   chemical and laser endovenous ablation of LE veins  2008, 2009, 2010, 2011   Dr Doreene et al   COLON SURGERY     COLONOSCOPY     multiple   COSMETIC SURGERY     DRESSING CHANGE UNDER ANESTHESIA Right 04/24/2020   Procedure: DRESSING CHANGE UNDER ANESTHESIA right knee;  Surgeon: Gerome Charleston, MD;  Location: [facility];  Service: Orthopedics;  Laterality: Right;   endoscopy     multiple   EYE SURGERY     FOOT SURGERY     left   FRACTURE SURGERY     I & D EXTREMITY Left 06/09/2020   Procedure: Debridement of left knee wound;  Surgeon: Elisabeth Craig RAMAN, MD;  Location: [facility];  Service: Plastics;  Laterality: Left;  total case 45 min   INCISION AND DRAINAGE Bilateral 04/22/2020   Procedure: INCISION AND DRAINAGE, WOUND VAC PLACEMENT ON LEFT KNEE;  Surgeon: Fidel Rogue, MD;  [facility]: [facility];  Service: Orthopedics;  Laterality: Bilateral;   INCISION AND DRAINAGE ABSCESS Left 04/24/2020   Procedure: INCISION AND DRAINAGE LEFT KNEE;  Surgeon: Gerome Charleston, MD;  [facility]: [facility];  Service: Orthopedics;  Laterality: Left;   INNER EAR SURGERY Right    childhood; right, ear drum repair   JOINT REPLACEMENT     LAPAROSCOPIC LYSIS OF ADHESIONS N/A 12/11/2017   Procedure: LAPAROSCOPIC LYSIS OF ADHESIONS;  Surgeon: Sheldon Standing, MD;  [facility]: [facility];  Service: General;  Laterality: N/A;   NASAL SEPTUM SURGERY     POLYPECTOMY     PROCTOSCOPY N/A 12/11/2017   Procedure: RIDGED PROCTOSCOPY;  Surgeon: Sheldon Standing, MD;  [facility]: [facility];  Service: General;  Laterality: N/A;   REVERSE SHOULDER ARTHROPLASTY Right 09/10/2019   Procedure: REVERSE SHOULDER ARTHROPLASTY;  Surgeon: Melita Drivers, MD;  [facility]: [facility];  Service:  Orthopedics;  Laterality: Right;    SKIN SPLIT GRAFT Left 07/26/2020   Procedure: SKIN GRAFT SPLIT THICKNESS;  Surgeon: Elisabeth Craig RAMAN, MD;  [facility];  Service: Plastics;  Laterality: Left;  SKIN GRAFT SPLIT THICKNESS   TONSILLECTOMY     TOTAL ABDOMINAL HYSTERECTOMY  11/1985   TYMPANOPLASTY Right 11/03/2018   Procedure: TYMPANOPLASTY;  Surgeon: Carlie Clark, MD;  Location: [facility];  Service: ENT;  Laterality: Right;   Allergies[1] Prior to Admission medications  Medication Sig Start Date End Date Taking? Authorizing Provider  acetaminophen  (TYLENOL ) 500 MG tablet Take 1,000 mg by mouth every 6 (six) hours as needed for moderate pain or headache.    Yes [provider]  ALPRAZolam  (XANAX ) 0.5 MG tablet TAKE 0.5-1 TABLETS (0.25-0.5 MG TOTAL) BY MOUTH DAILY AS NEEDED  FOR ANXIETY 11/27/24  Yes Douglass Kenney NOVAK, FNP  aspirin  EC 81 MG tablet Take 81 mg by mouth every Monday, Wednesday, and Friday. Swallow whole.   Yes [provider]  Calcium  Carbonate-Vitamin D  600-400 MG-UNIT tablet Take 1 tablet by mouth 2 (two) times daily.    Yes [provider]  certolizumab pegol (CIMZIA) 2 X 200 MG KIT as directed Subcutaneous   Yes [provider]  Cholecalciferol  (VITAMIN D ) 50 MCG (2000 UT) CAPS Take 2,000 Units by mouth daily.    Yes [provider]  fenofibrate  160 MG tablet TAKE 1 TABLET BY MOUTH EVERYDAY AT BEDTIME 03/02/24  Yes Jodie Lavern CROME, MD  fexofenadine  (ALLEGRA ) 180 MG tablet Take 1 tablet (180 mg total) by mouth daily. 03/27/23  Yes Jodie Lavern CROME, MD  fluticasone  (FLONASE ) 50 MCG/ACT nasal spray SPRAY 2 SPRAYS INTO EACH NOSTRIL DAILY AS NEEDED FOR ALLERGIES 10/28/23  Yes Webb, Padonda B, FNP  folic acid (FOLVITE) 1 MG tablet TAKE 1 TABLET BY MOUTH EVERY DAY FOR 90 DAYS Oral for 90 Days   Yes [provider]  lisinopril  (ZESTRIL ) 40 MG tablet Take 1 tablet (40 mg total) by mouth daily. 03/25/24  Yes Jodie Lavern CROME, MD  methotrexate (RHEUMATREX) 2.5 MG tablet Take 15 mg by mouth once a week.   Yes [provider]  Multiple Vitamin (MULTIVITAMIN) tablet Take 1 tablet by mouth daily.   Yes [provider]  pantoprazole  (PROTONIX ) 20 MG tablet Take 1 tablet (20 mg total) by mouth daily. 09/28/24  Yes Jodie Lavern CROME, MD  polyethylene glycol (MIRALAX ) 17 g packet Take 17 g by mouth daily. 04/28/20  Yes Briana Elgin LABOR, MD  potassium chloride  SA (KLOR-CON ) 20 MEQ tablet Take 1 tablet (20 mEq total) by mouth daily as needed (leg swelling). Take with furosemide  12/27/20  Yes Jodie Lavern CROME, MD  predniSONE  (DELTASONE ) 5 MG tablet Take 5 mg by mouth daily. 08/24/14  Yes [provider]  simvastatin  (ZOCOR ) 40 MG tablet TAKE 1 TABLET BY MOUTH EVERYDAY AT BEDTIME 09/14/24  Yes Jodie Lavern CROME, MD  valACYclovir (VALTREX) 500 MG tablet Take 500 mg by mouth 2 (two) times daily.   Yes [provider]  clobetasol (TEMOVATE) 0.05 % external solution 1 application Externally Twice a day for 14 days Patient not taking: Reported on 12/11/2024 01/29/24   [provider]  cyclobenzaprine  (FLEXERIL ) 5 MG tablet Take 1 tablet (5 mg total) by mouth 3 (three) times daily as needed for muscle spasms. Patient not taking: Reported on 12/11/2024 09/28/24   Jodie Lavern CROME, MD  furosemide  (LASIX ) 20 MG tablet Take 1 tablet (20 mg total) by mouth daily as needed for edema. Take with potassium Patient not taking: Reported on 12/11/2024 12/27/20   Jodie Lavern CROME, MD  prednisoLONE acetate (PRED FORTE) 1 % ophthalmic suspension 1 drop 3 (three) times daily. Patient not taking: Reported on 12/11/2024    [provider]   Social History   Socioeconomic History   Marital status: Married    Spouse name: Prentice   Number of children: 4   Years of education: Not on file   Highest education level: Associate degree: academic program  Occupational History   Occupation: Horticulturist, Commercial: [employer]  Tobacco Use   Smoking status: Former    Current packs/day: 0.00    Average packs/day: 0.5 packs/day for 15.0 years (7.5 ttl pk-yrs)    Types: Cigarettes  Start date: 12/03/1986    Quit date: 12/03/2001    Years since quitting: 23.0   Smokeless tobacco: Never  Vaping Use   Vaping status: Never Used  Substance and Sexual Activity   Alcohol  use: Yes    Comment: occasional   Drug use: No   Sexual activity: Not Currently    Birth control/protection: Surgical    Comment: Hysterectomy  Other Topics Concern   Not on file  Social History Narrative   Married to [place], with children and grandchildren. Large supportive family; banking, no tob or alcohol  or drug use   Social Drivers of Health   Tobacco Use: Medium Risk (12/11/2024)   Patient History    Smoking Tobacco Use: Former    Smokeless Tobacco Use: Never    Passive Exposure: Not on file  Financial Resource Strain: Low Risk (09/28/2024)   Overall Financial Resource Strain (CARDIA)    Difficulty of Paying Living Expenses: Not hard at all  Food Insecurity: No Food Insecurity (09/28/2024)   Epic    Worried About Programme Researcher, Broadcasting/film/video in the Last Year: Never true    Ran Out of Food in the Last Year: Never true  Transportation Needs: No Transportation Needs (09/28/2024)   Epic    Lack of Transportation (Medical): No    Lack of Transportation (Non-Medical): No  Physical Activity: Sufficiently Active (09/28/2024)   Exercise Vital Sign    Days of Exercise per Week: 3 days    Minutes of Exercise per Session: 50 min  Recent Concern: Physical Activity - Insufficiently Active (08/06/2024)   Exercise Vital Sign    Days of Exercise per Week: 3 days    Minutes of Exercise per Session: 40 min  Stress: No Stress Concern Present (09/28/2024)   [employer] of Occupational Health - Occupational Stress Questionnaire    Feeling of Stress: Not at all  Social Connections: Socially Integrated (09/28/2024)   Social  Connection and Isolation Panel    Frequency of Communication with Friends and Family: More than three times a week    Frequency of Social Gatherings with Friends and Family: Twice a week    Attends Religious Services: More than 4 times per year    Active Member of [employer] or Organizations: Yes    Attends Banker Meetings: More than 4 times per year    Marital Status: Married  Catering Manager Violence: Not At Risk (04/22/2024)   Humiliation, Afraid, Rape, and Kick questionnaire    Fear of Current or Ex-Partner: No    Emotionally Abused: No    Physically Abused: No    Sexually Abused: No  Depression (PHQ2-9): Low Risk (09/28/2024)   Depression (PHQ2-9)    PHQ-2 Score: 0  Alcohol  Screen: Low Risk (09/28/2024)   Alcohol  Screen    Last Alcohol  Screening Score (AUDIT): 1  Housing: Low Risk (10/23/2024)   Received from [facility]   Epic    Think about the place you live. Do you have problems with any of the following? Choose all that apply:: Not on file    What is your living situation today?: I have a steady place to live  Utilities: Not At Risk (04/22/2024)   [facility] Utilities    Threatened with loss of utilities: No  Health Literacy: Adequate Health Literacy (04/22/2024)   B1300 Health Literacy    Frequency of need for help with medical instructions: Never    Review of Systems   Objective:   Vitals:   12/11/24 1125  BP: 128/74  Pulse: 66  Resp: 18  Temp: 97.9 F (36.6 C)  TempSrc: Temporal  SpO2: 97%  Weight: 182 lb (82.6 kg)  Height: 5' 4 (1.626 m)     Physical Exam Constitutional:      General: She is not in acute distress.    Appearance: Normal appearance. She is well-developed. She is not ill-appearing, toxic-appearing or diaphoretic.  HENT:     Head: Normocephalic and atraumatic.  Cardiovascular:     Rate and Rhythm: Normal rate.  Pulmonary:     Effort: Pulmonary effort is normal.  Abdominal:     General: Abdomen is flat. There is no distension.      Tenderness: There is abdominal tenderness (Reproducible tenderness right upper quadrant with mildly positive Murphy sign). There is no right CVA tenderness, left CVA tenderness, guarding or rebound.  Neurological:     Mental Status: She is alert and oriented to person, place, and time.  Psychiatric:        Mood and Affect: Mood normal.     Results for orders placed or performed in visit on 12/11/24  CBC   Collection Time: 12/11/24 12:17 PM  Result Value Ref Range   WBC 8.9 4.0 - 10.5 K/uL   RBC 4.00 3.87 - 5.11 Mil/uL   Platelets 71.0 Repeated and verified X2. (L) 150.0 - 400.0 K/uL   Hemoglobin 12.3 12.0 - 15.0 g/dL   HCT 62.8 63.9 - 53.9 %   MCV 92.7 78.0 - 100.0 fl   MCHC 33.2 30.0 - 36.0 g/dL   RDW 83.4 (H) 88.4 - 84.4 %  Comprehensive metabolic panel with GFR   Collection Time: 12/11/24 12:17 PM  Result Value Ref Range   Sodium 139 135 - 145 mEq/L   Potassium 4.3 3.5 - 5.1 mEq/L   Chloride 103 96 - 112 mEq/L   CO2 31 19 - 32 mEq/L   Glucose, Bld 91 70 - 99 mg/dL   BUN 21 6 - 23 mg/dL   Creatinine, Ser 9.23 0.40 - 1.20 mg/dL   Total Bilirubin 0.6 0.2 - 1.2 mg/dL   Alkaline Phosphatase 51 39 - 117 U/L   AST 32 5 - 37 U/L   ALT 22 3 - 35 U/L   Total Protein 7.0 6.0 - 8.3 g/dL   Albumin 4.4 3.5 - 5.2 g/dL   GFR 23.47 >39.99 mL/min   Calcium  9.7 8.4 - 10.5 mg/dL  POCT urinalysis dipstick   Collection Time: 12/11/24 12:30 PM  Result Value Ref Range   Color, UA yellow yellow   Clarity, UA clear clear   Glucose, UA negative negative mg/dL   Bilirubin, UA negative negative   Ketones, POC UA negative negative mg/dL   Spec Grav, UA 8.984 8.989 - 1.025   Blood, UA trace-lysed (A) negative   pH, UA 6.0 5.0 - 8.0   Protein Ur, POC trace (A) negative mg/dL   Urobilinogen, UA 0.2 0.2 or 1.0 E.U./dL   Nitrite, UA Negative Negative   Leukocytes, UA Negative Negative   US  Abdomen Limited RUQ (LIVER/GB) Result Date: 12/11/2024 CLINICAL DATA:  Nausea, right upper quadrant  pain for 3 days EXAM: ULTRASOUND ABDOMEN LIMITED RIGHT UPPER QUADRANT COMPARISON:  06/18/2018 FINDINGS: Gallbladder: 6 mm calculus is seen within the gallbladder fundus, layering dependently. Minimal gallbladder sludge. No gallbladder wall thickening or pericholecystic fluid. There is a positive sonographic Murphy sign. Common bile duct: Diameter: 3 mm Liver: No focal lesion identified. Within normal limits in parenchymal echogenicity. Portal vein  is patent on color Doppler imaging with normal direction of blood flow towards the liver. Other: None. IMPRESSION: 1. Cholelithiasis and gallbladder sludge. While there is a positive sonographic Murphy sign, there are no other sonographic findings to suggest acute cholecystitis. Electronically Signed   By: Ozell Daring M.D.   On: 12/11/2024 16:50     Assessment & Plan:  Morgan [name] is a 74 y.o. female . RUQ abdominal pain - Plan: CBC, Comprehensive metabolic panel with GFR, POCT urinalysis dipstick, US  Abdomen Limited RUQ (LIVER/GB)  Nausea - Plan: CBC, Comprehensive metabolic panel with GFR, POCT urinalysis dipstick, US  Abdomen Limited RUQ (LIVER/GB)  3-day history of right upper quadrant abdominal pain with initial nausea, no vomiting or fever.  She has still been able to eat and drink, some persistent discomfort.  Positive Murphy's testing in office but otherwise reassuring exam and vitals.  Based on symptoms, history CBC, CMP and ultrasound was ordered, as well as urinalysis but unlikely cause.    Called patient Friday evening after results received.  At trace blood noted on urine, otherwise no sign of infection, has been seen previously, can be followed up with her primary care provider.  No leukocytosis on CBC, LFTs normal.  Cholelithiasis with sludge, positive radiographic Murphy's but no gallbladder wall thickening and overall reassuring ultrasound otherwise.  Discussed with general surgery on-call.  Has minimal symptoms at this time with  reassuring labs as above plan for outpatient general surgery follow-up in the next week or 2 with ER precautions given over the weekend if any worsening symptoms.  Bland diet discussed in the interim including avoiding fried/fatty foods.  Plan discussed with patient.  She has seen Dr. Sheldon previously with general surgery, referral will be placed but she can also call their office this week.  All questions were answered.  No orders of the defined types were placed in this encounter.  Patient Instructions  Thank you for coming in today.  As we discussed your symptoms are suspicious for possible gallbladder issue.  I have arranged for an ultrasound this afternoon at 4:15pm.  [facility], see address below.  Nothing to eat or drink prior to that time.  Once I receive the report of that ultrasound and labs I will let you know.  Hang in there!  [facility] Address: [address] [place], [place] [address] Phone: [phone]    Signed,   Reyes Pines, MD [facility], [facility] 12/12/2024 5:14 PM      [1]  Allergies Allergen Reactions   Remicade [Infliximab] Anaphylaxis and Swelling    Facial swelling   Bee Venom Other (See Comments)    Other reaction(s): swelling   Poison Ivy Extract Other (See Comments)    Other reaction(s): swelling   Adalimumab Other (See Comments)    Other reaction(s): lost efficacy, Other (See Comments)   Golimumab  Other (See Comments)    Other reaction(s): Other (See Comments), psoriasis   Hydroxychloroquine  Other (See Comments)    Other Reaction(s): discoloration of skin   Secukinumab (300 Mg Dose) Other (See Comments)    Other Reaction(s): lost efficacy   "

## 2024-12-11 NOTE — Telephone Encounter (Signed)
 FYI Only or Action Required?: FYI only for provider: appointment scheduled on 12/11/24 at alternate office.  Patient was last seen in primary care on 09/28/2024 by Jodie Lavern CROME, MD.  Called Nurse Triage reporting Abdominal Pain.  Symptoms began several days ago.  Interventions attempted: OTC medications: Tylenol  and Gas-X.  Symptoms are: stable.  Triage Disposition: See HCP Within 4 Hours (Or PCP Triage)  Patient/caregiver understands and will follow disposition?: Yes                                    1. LOCATION: Where does it hurt?      RUQ, near ribcage 2. RADIATION: Does the pain shoot anywhere else? (e.g., chest, back)     Radiated to back a couple of times 3. ONSET: When did the pain begin? (e.g., minutes, hours or days ago)      Tuesday 4. SUDDEN: Gradual or sudden onset?     Sudden 5. PATTERN Does the pain come and go, or is it constant?     Constant at this time 6. SEVERITY: How bad is the pain?  (e.g., Scale 1-10; mild, moderate, or severe)     Rates pain 2-3 at this time, describes pain as dull achy at this time, area is tender to the touch, pain was initially sharp 8. CAUSE: What do you think is causing the stomach pain? (e.g., gallstones, recent abdominal surgery)     Unsure, states she had a lot of gas the day the pain onset 10. OTHER SYMPTOMS: Do you have any other symptoms? (e.g., back pain, diarrhea, fever, urination pain, vomiting)     Nausea on Tuesday and Wednesday- denies today Denies fever, denies vomiting, denies diarrhea, denies chest pain, denies difficulty breathing, denies urinary issues    This RN advised in-person evaluation today. No availability with PCP office. Patient has been scheduled for appointment with alternate office within region.    Reason for Disposition  [1] MILD-MODERATE pain AND [2] constant AND [3] present > 2 hours  Protocols used: Abdominal Pain - Female-A-AH  Patient was  transferred directly from front desk of [facility] for triage.

## 2024-12-13 ENCOUNTER — Ambulatory Visit: Payer: Self-pay | Admitting: Family Medicine

## 2024-12-14 ENCOUNTER — Encounter: Payer: Self-pay | Admitting: Family Medicine

## 2024-12-14 NOTE — Telephone Encounter (Signed)
 Patient saw you last week for right upper quadrant pain, she would like to know if it is okay if she goes to the gym to walk around was previously trying to take it easy with the recent pain

## 2024-12-15 ENCOUNTER — Ambulatory Visit: Admitting: Family Medicine

## 2024-12-17 ENCOUNTER — Other Ambulatory Visit: Payer: Self-pay | Admitting: Family Medicine

## 2024-12-18 ENCOUNTER — Encounter: Payer: Self-pay | Admitting: Family Medicine

## 2024-12-28 ENCOUNTER — Ambulatory Visit: Admitting: Family Medicine

## 2024-12-31 NOTE — Progress Notes (Signed)
"       ° °  LILLETTE Ileana Collet, PhD, LAT, ATC acting as a scribe for Artist Lloyd, MD.  Morgan [name] is a 74 y.o. female who presents to [facility] at [facility] today for 70-month f/u bilat knee, L wrist, and R elbow pain resulting from a fall. Pt was last seen by Dr. Lloyd on 11/25/24 and was given bilat knee steroid injections. Advised to cont wrist brace and advance activity as tolerated. Pt was referred back to hematology/oncology to further eval the mottled appearance of several of her bones. Visit scheduled for 2/2.  Today, pt reports she has been taking off her wrist brace intermittently, but wears it a lot. Prior knee CSI were helpful. She's been able to get back to the gym and is able to walk 1/2 mile before the knees start hurting.  Pertinent review of systems: No fevers or chills  Relevant historical information: Rheumatoid arthritis   Exam:  BP (!) 142/78   Pulse 77   Ht 5' 4 (1.626 m)   Wt 182 lb (82.6 kg)   LMP  (LMP Unknown)   SpO2 99%   BMI 31.24 kg/m  General: Well Developed, well nourished, and in no acute distress.   MSK: Left wrist wearing thumb spica brace.  Mildly tender palpation dorsal wrist.  Decreased range of motion  Knees bilaterally decreased range of motion mildly.  Antalgic gait.    Lab and Radiology Results  X-ray images left wrist obtained today personally and independently interpreted. Similar appearance of lunate from x-ray 6 weeks ago.  Does have AVN and sclerosis changes of the lunate.  Degenerative changes also present in the wrist.  No acute fractures are visible. Await formal radiology review    Assessment and Plan: 74 y.o. female with chronic left wrist pain secondary to degenerative changes and AVN of the lunate.  Plan for referral to Occupational Therapy.  She should benefit from therapy from a pain standpoint but additionally could help with custom splinting or bracing if needed.  If not better consider hand surgery  referral or even advanced imaging such as MRI.  Bilateral knee pain due to DJD.  Had some benefit with steroid injections about a month ago.  Pain is still present.  Will work on authorization for Zilretta  injection with a backup for single dose gel injection of his right is not authorized.  Anticipate proceeding with Zilretta  injection in the near future.   PDMP not reviewed this encounter. Orders Placed This Encounter  Procedures   DG Wrist Complete Left    Standing Status:   Future    Number of Occurrences:   1    Expiration Date:   01/01/2026    Reason for Exam (SYMPTOM  OR DIAGNOSIS REQUIRED):   left wrist pain follow up    Preferred imaging location?:   [place] [facility]   Ambulatory referral to Occupational Therapy    Referral Priority:   Routine    Referral Type:   Occupational Therapy    Referral Reason:   Specialty Services Required    Requested Specialty:   Occupational Therapy    Number of Visits Requested:   1   No orders of the defined types were placed in this encounter.    Discussed warning signs or symptoms. Please see discharge instructions. Patient expresses understanding.   The above documentation has been reviewed and is accurate and complete Artist Lloyd, M.D.   "

## 2025-01-01 ENCOUNTER — Ambulatory Visit

## 2025-01-01 ENCOUNTER — Ambulatory Visit: Admitting: Family Medicine

## 2025-01-01 VITALS — BP 142/78 | HR 77 | Ht 64.0 in | Wt 182.0 lb

## 2025-01-01 DIAGNOSIS — G8929 Other chronic pain: Secondary | ICD-10-CM | POA: Diagnosis not present

## 2025-01-01 DIAGNOSIS — M25532 Pain in left wrist: Secondary | ICD-10-CM

## 2025-01-01 DIAGNOSIS — M170 Bilateral primary osteoarthritis of knee: Secondary | ICD-10-CM

## 2025-01-01 DIAGNOSIS — M25561 Pain in right knee: Secondary | ICD-10-CM

## 2025-01-01 DIAGNOSIS — K8020 Calculus of gallbladder without cholecystitis without obstruction: Secondary | ICD-10-CM | POA: Insufficient documentation

## 2025-01-01 DIAGNOSIS — M25562 Pain in left knee: Secondary | ICD-10-CM

## 2025-01-01 NOTE — Patient Instructions (Addendum)
 Thank you for coming in today.   Please get an Xray today before you leave   A referral for Occupational Therapy has been submitted. A representative from the Occupational therapy office will contact you to coordinate scheduling after confirming your benefits with your insurance provider. If you do not hear from the Occupational therapy office within the next 1-2 weeks, please let us  know.  See you back if your symptoms do not improve with Occupational Therapy.

## 2025-01-03 ENCOUNTER — Telehealth: Payer: Self-pay

## 2025-01-03 NOTE — Telephone Encounter (Signed)
 Spoke directly with patient advising of new appointments date and time, she was agreeable and verbalized understanding.

## 2025-01-04 ENCOUNTER — Inpatient Hospital Stay: Admitting: Oncology

## 2025-01-04 ENCOUNTER — Inpatient Hospital Stay

## 2025-01-05 ENCOUNTER — Other Ambulatory Visit: Payer: Self-pay | Admitting: Oncology

## 2025-01-05 ENCOUNTER — Ambulatory Visit: Payer: Self-pay | Admitting: Family Medicine

## 2025-01-05 ENCOUNTER — Ambulatory Visit: Payer: Self-pay | Admitting: Surgery

## 2025-01-05 DIAGNOSIS — D696 Thrombocytopenia, unspecified: Secondary | ICD-10-CM

## 2025-01-05 DIAGNOSIS — D72828 Other elevated white blood cell count: Secondary | ICD-10-CM

## 2025-01-05 NOTE — Progress Notes (Signed)
 Left wrist x-ray shows unchanged appearance of the lunate bone.

## 2025-01-08 ENCOUNTER — Inpatient Hospital Stay

## 2025-01-08 DIAGNOSIS — D72828 Other elevated white blood cell count: Secondary | ICD-10-CM

## 2025-01-08 DIAGNOSIS — D696 Thrombocytopenia, unspecified: Secondary | ICD-10-CM

## 2025-01-08 LAB — CBC WITH DIFFERENTIAL (CANCER CENTER ONLY)
Abs Immature Granulocytes: 0.35 10*3/uL — ABNORMAL HIGH (ref 0.00–0.07)
Basophils Absolute: 0 10*3/uL (ref 0.0–0.1)
Basophils Relative: 0 %
Eosinophils Absolute: 0.1 10*3/uL (ref 0.0–0.5)
Eosinophils Relative: 1 %
HCT: 36.8 % (ref 36.0–46.0)
Hemoglobin: 11.9 g/dL — ABNORMAL LOW (ref 12.0–15.0)
Immature Granulocytes: 4 %
Lymphocytes Relative: 13 %
Lymphs Abs: 1.3 10*3/uL (ref 0.7–4.0)
MCH: 30.4 pg (ref 26.0–34.0)
MCHC: 32.3 g/dL (ref 30.0–36.0)
MCV: 94.1 fL (ref 80.0–100.0)
Monocytes Absolute: 1.9 10*3/uL — ABNORMAL HIGH (ref 0.1–1.0)
Monocytes Relative: 20 %
Neutro Abs: 6 10*3/uL (ref 1.7–7.7)
Neutrophils Relative %: 62 %
Platelet Count: 81 10*3/uL — ABNORMAL LOW (ref 150–400)
RBC: 3.91 MIL/uL (ref 3.87–5.11)
RDW: 15.9 % — ABNORMAL HIGH (ref 11.5–15.5)
WBC Count: 9.5 10*3/uL (ref 4.0–10.5)
nRBC: 0 % (ref 0.0–0.2)

## 2025-01-08 LAB — VITAMIN B12: Vitamin B-12: 781 pg/mL (ref 180–914)

## 2025-01-08 LAB — CMP (CANCER CENTER ONLY)
ALT: 24 U/L (ref 0–44)
AST: 37 U/L (ref 15–41)
Albumin: 4.6 g/dL (ref 3.5–5.0)
Alkaline Phosphatase: 58 U/L (ref 38–126)
Anion gap: 10 (ref 5–15)
BUN: 24 mg/dL — ABNORMAL HIGH (ref 8–23)
CO2: 28 mmol/L (ref 22–32)
Calcium: 10.4 mg/dL — ABNORMAL HIGH (ref 8.9–10.3)
Chloride: 102 mmol/L (ref 98–111)
Creatinine: 1.03 mg/dL — ABNORMAL HIGH (ref 0.44–1.00)
GFR, Estimated: 56 mL/min — ABNORMAL LOW
Glucose, Bld: 100 mg/dL — ABNORMAL HIGH (ref 70–99)
Potassium: 4.5 mmol/L (ref 3.5–5.1)
Sodium: 140 mmol/L (ref 135–145)
Total Bilirubin: 0.4 mg/dL (ref 0.0–1.2)
Total Protein: 7.5 g/dL (ref 6.5–8.1)

## 2025-01-08 LAB — IRON AND TIBC
Iron: 39 ug/dL (ref 28–170)
Saturation Ratios: 9 % — ABNORMAL LOW (ref 10.4–31.8)
TIBC: 419 ug/dL (ref 250–450)
UIBC: 380 ug/dL

## 2025-01-08 LAB — IMMATURE PLATELET FRACTION: Immature Platelet Fraction: 16 % — ABNORMAL HIGH (ref 1.2–8.6)

## 2025-01-08 LAB — FERRITIN: Ferritin: 102 ng/mL (ref 11–307)

## 2025-01-08 LAB — LACTATE DEHYDROGENASE: LDH: 339 U/L — ABNORMAL HIGH (ref 105–235)

## 2025-01-08 LAB — SEDIMENTATION RATE: Sed Rate: 2 mm/h (ref 0–22)

## 2025-01-08 LAB — C-REACTIVE PROTEIN: CRP: <0.5 mg/dL

## 2025-01-08 LAB — FOLATE: Folate: >20 ng/mL

## 2025-01-09 ENCOUNTER — Inpatient Hospital Stay: Admitting: Oncology

## 2025-01-20 ENCOUNTER — Ambulatory Visit: Admitting: Occupational Therapy

## 2025-03-29 ENCOUNTER — Ambulatory Visit: Admitting: Family Medicine

## 2025-05-03 ENCOUNTER — Ambulatory Visit
